# Patient Record
Sex: Female | Born: 1965
Health system: Southern US, Community
[De-identification: ages and names within clinical notes are randomized; demographics above are authoritative.]

## PROBLEM LIST (undated history)

## (undated) DIAGNOSIS — N920 Excessive and frequent menstruation with regular cycle: Secondary | ICD-10-CM

## (undated) DIAGNOSIS — K219 Gastro-esophageal reflux disease without esophagitis: Secondary | ICD-10-CM

## (undated) DIAGNOSIS — F329 Major depressive disorder, single episode, unspecified: Secondary | ICD-10-CM

## (undated) DIAGNOSIS — E559 Vitamin D deficiency, unspecified: Secondary | ICD-10-CM

## (undated) DIAGNOSIS — G4733 Obstructive sleep apnea (adult) (pediatric): Secondary | ICD-10-CM

## (undated) DIAGNOSIS — Z973 Presence of spectacles and contact lenses: Secondary | ICD-10-CM

## (undated) DIAGNOSIS — R0602 Shortness of breath: Secondary | ICD-10-CM

## (undated) DIAGNOSIS — F32A Depression, unspecified: Secondary | ICD-10-CM

## (undated) DIAGNOSIS — R5383 Other fatigue: Secondary | ICD-10-CM

## (undated) DIAGNOSIS — I10 Essential (primary) hypertension: Secondary | ICD-10-CM

## (undated) DIAGNOSIS — F419 Anxiety disorder, unspecified: Secondary | ICD-10-CM

## (undated) DIAGNOSIS — E785 Hyperlipidemia, unspecified: Secondary | ICD-10-CM

## (undated) DIAGNOSIS — E119 Type 2 diabetes mellitus without complications: Secondary | ICD-10-CM

## (undated) DIAGNOSIS — Z9989 Dependence on other enabling machines and devices: Secondary | ICD-10-CM

## (undated) DIAGNOSIS — K5900 Constipation, unspecified: Secondary | ICD-10-CM

## (undated) HISTORY — DX: Obstructive sleep apnea (adult) (pediatric): G47.33

## (undated) HISTORY — DX: Essential (primary) hypertension: I10

## (undated) HISTORY — DX: Hyperlipidemia, unspecified: E78.5

## (undated) HISTORY — DX: Constipation, unspecified: K59.00

## (undated) HISTORY — DX: Vitamin D deficiency, unspecified: E55.9

## (undated) HISTORY — PX: BREAST EXCISIONAL BIOPSY: SUR124

## (undated) HISTORY — DX: Shortness of breath: R06.02

## (undated) HISTORY — DX: Other fatigue: R53.83

---

## 1998-08-15 ENCOUNTER — Other Ambulatory Visit: Admission: RE | Admit: 1998-08-15 | Discharge: 1998-08-15 | Payer: Self-pay | Admitting: Obstetrics and Gynecology

## 1999-12-01 ENCOUNTER — Other Ambulatory Visit: Admission: RE | Admit: 1999-12-01 | Discharge: 1999-12-01 | Payer: Self-pay | Admitting: Obstetrics and Gynecology

## 2001-05-16 ENCOUNTER — Other Ambulatory Visit: Admission: RE | Admit: 2001-05-16 | Discharge: 2001-05-16 | Payer: Self-pay | Admitting: *Deleted

## 2001-12-07 ENCOUNTER — Encounter: Admission: RE | Admit: 2001-12-07 | Discharge: 2002-03-07 | Payer: Self-pay | Admitting: *Deleted

## 2002-02-13 ENCOUNTER — Other Ambulatory Visit: Admission: RE | Admit: 2002-02-13 | Discharge: 2002-02-13 | Payer: Self-pay | Admitting: *Deleted

## 2004-07-10 ENCOUNTER — Inpatient Hospital Stay (HOSPITAL_COMMUNITY): Admission: AD | Admit: 2004-07-10 | Discharge: 2004-07-10 | Payer: Self-pay | Admitting: Obstetrics & Gynecology

## 2006-05-20 ENCOUNTER — Ambulatory Visit: Payer: Self-pay | Admitting: Internal Medicine

## 2006-05-20 LAB — CONVERTED CEMR LAB
ALT: 17 units/L (ref 0–40)
AST: 17 units/L (ref 0–37)
Alkaline Phosphatase: 62 units/L (ref 39–117)
BUN: 14 mg/dL (ref 6–23)
Basophils Relative: 3.6 % — ABNORMAL HIGH (ref 0.0–1.0)
Cholesterol: 152 mg/dL (ref 0–200)
Creatinine, Ser: 0.9 mg/dL (ref 0.4–1.2)
Crystals: NEGATIVE
Eosinophils Absolute: 0.1 10*3/uL (ref 0.0–0.6)
GFR calc Af Amer: 89 mL/min
GFR calc non Af Amer: 74 mL/min
Hemoglobin: 12.1 g/dL (ref 12.0–15.0)
Lymphocytes Relative: 20.9 % (ref 12.0–46.0)
MCHC: 33.1 g/dL (ref 30.0–36.0)
Monocytes Relative: 6 % (ref 3.0–11.0)
Neutro Abs: 5.1 10*3/uL (ref 1.4–7.7)
Neutrophils Relative %: 68.5 % (ref 43.0–77.0)
Nitrite: NEGATIVE
Platelets: 242 10*3/uL (ref 150–400)
RBC: 3.97 M/uL (ref 3.87–5.11)
Sodium: 141 meq/L (ref 135–145)
Total Protein, Urine: NEGATIVE mg/dL
Urine Glucose: NEGATIVE mg/dL
VLDL: 13 mg/dL (ref 0–40)

## 2006-05-26 ENCOUNTER — Ambulatory Visit: Payer: Self-pay | Admitting: Internal Medicine

## 2006-07-02 ENCOUNTER — Ambulatory Visit: Payer: Self-pay | Admitting: Pulmonary Disease

## 2006-09-21 ENCOUNTER — Encounter: Admission: RE | Admit: 2006-09-21 | Discharge: 2006-09-21 | Payer: Self-pay | Admitting: Specialist

## 2006-09-28 ENCOUNTER — Encounter: Admission: RE | Admit: 2006-09-28 | Discharge: 2006-09-28 | Payer: Self-pay | Admitting: Specialist

## 2007-06-22 ENCOUNTER — Ambulatory Visit: Payer: Self-pay | Admitting: Thoracic Surgery

## 2007-09-22 ENCOUNTER — Encounter: Admission: RE | Admit: 2007-09-22 | Discharge: 2007-09-22 | Payer: Self-pay | Admitting: Specialist

## 2007-09-29 ENCOUNTER — Encounter: Admission: RE | Admit: 2007-09-29 | Discharge: 2007-09-29 | Payer: Self-pay | Admitting: Specialist

## 2007-10-20 ENCOUNTER — Ambulatory Visit (HOSPITAL_COMMUNITY): Admission: RE | Admit: 2007-10-20 | Discharge: 2007-10-20 | Payer: Self-pay | Admitting: Surgery

## 2007-11-09 ENCOUNTER — Ambulatory Visit (HOSPITAL_COMMUNITY): Admission: RE | Admit: 2007-11-09 | Discharge: 2007-11-09 | Payer: Self-pay | Admitting: Surgery

## 2007-11-10 ENCOUNTER — Encounter: Admission: RE | Admit: 2007-11-10 | Discharge: 2007-11-10 | Payer: Self-pay | Admitting: Surgery

## 2007-11-14 ENCOUNTER — Encounter (INDEPENDENT_AMBULATORY_CARE_PROVIDER_SITE_OTHER): Payer: Self-pay | Admitting: Surgery

## 2007-11-14 ENCOUNTER — Encounter: Admission: RE | Admit: 2007-11-14 | Discharge: 2007-11-14 | Payer: Self-pay | Admitting: Surgery

## 2007-11-14 ENCOUNTER — Ambulatory Visit (HOSPITAL_BASED_OUTPATIENT_CLINIC_OR_DEPARTMENT_OTHER): Admission: RE | Admit: 2007-11-14 | Discharge: 2007-11-14 | Payer: Self-pay | Admitting: Surgery

## 2007-11-14 HISTORY — PX: OTHER SURGICAL HISTORY: SHX169

## 2008-04-25 ENCOUNTER — Ambulatory Visit: Payer: Self-pay | Admitting: Thoracic Surgery

## 2008-04-30 ENCOUNTER — Ambulatory Visit: Payer: Self-pay | Admitting: Thoracic Surgery

## 2008-04-30 ENCOUNTER — Ambulatory Visit (HOSPITAL_COMMUNITY): Admission: RE | Admit: 2008-04-30 | Discharge: 2008-05-01 | Payer: Self-pay | Admitting: Surgery

## 2008-04-30 ENCOUNTER — Encounter (INDEPENDENT_AMBULATORY_CARE_PROVIDER_SITE_OTHER): Payer: Self-pay | Admitting: Surgery

## 2008-04-30 ENCOUNTER — Encounter: Payer: Self-pay | Admitting: Surgery

## 2008-04-30 HISTORY — PX: THYROID LOBECTOMY: SHX420

## 2008-09-24 ENCOUNTER — Encounter: Admission: RE | Admit: 2008-09-24 | Discharge: 2008-09-24 | Payer: Self-pay | Admitting: Specialist

## 2009-03-12 ENCOUNTER — Encounter: Admission: RE | Admit: 2009-03-12 | Discharge: 2009-03-12 | Payer: Self-pay | Admitting: Specialist

## 2009-04-16 ENCOUNTER — Encounter: Admission: RE | Admit: 2009-04-16 | Discharge: 2009-04-16 | Payer: Self-pay | Admitting: Orthopedic Surgery

## 2009-07-10 ENCOUNTER — Encounter: Admission: RE | Admit: 2009-07-10 | Discharge: 2009-07-10 | Payer: Self-pay | Admitting: Specialist

## 2009-09-26 ENCOUNTER — Encounter: Admission: RE | Admit: 2009-09-26 | Discharge: 2009-09-26 | Payer: Self-pay | Admitting: Specialist

## 2010-03-02 ENCOUNTER — Encounter: Payer: Self-pay | Admitting: Specialist

## 2010-05-22 LAB — PROTIME-INR
INR: 1 (ref 0.00–1.49)
Prothrombin Time: 13.5 seconds (ref 11.6–15.2)

## 2010-05-22 LAB — COMPREHENSIVE METABOLIC PANEL
Albumin: 3.6 g/dL (ref 3.5–5.2)
Alkaline Phosphatase: 58 U/L (ref 39–117)
BUN: 17 mg/dL (ref 6–23)
CO2: 27 mEq/L (ref 19–32)
Creatinine, Ser: 1.13 mg/dL (ref 0.4–1.2)
GFR calc Af Amer: >60 mL/min (ref >60)
Potassium: 3.5 mEq/L (ref 3.5–5.1)
Sodium: 137 mEq/L (ref 135–145)

## 2010-05-22 LAB — DIFFERENTIAL
Basophils Absolute: 0 10*3/uL (ref 0.0–0.1)
Basophils Relative: 1 % (ref 0–1)
Eosinophils Relative: 1 % (ref 0–5)
Lymphs Abs: 1.5 10*3/uL (ref 0.7–4.0)
Monocytes Absolute: 0.6 10*3/uL (ref 0.1–1.0)
Monocytes Relative: 8 % (ref 3–12)

## 2010-05-22 LAB — TYPE AND SCREEN
ABO/RH(D): O POS
Antibody Screen: NEGATIVE

## 2010-05-22 LAB — CBC
Hemoglobin: 12.6 g/dL (ref 12.0–15.0)
MCV: 92.5 fL (ref 78.0–100.0)
RBC: 3.89 MIL/uL (ref 3.87–5.11)

## 2010-05-22 LAB — URINE MICROSCOPIC-ADD ON

## 2010-05-22 LAB — URINALYSIS, ROUTINE W REFLEX MICROSCOPIC

## 2010-05-22 LAB — ABO/RH: ABO/RH(D): O POS

## 2010-05-22 LAB — CALCIUM: Calcium: 9.8 mg/dL (ref 8.4–10.5)

## 2010-06-24 NOTE — Op Note (Signed)
Erin [name], [name] NO.:  [id number]   MEDICAL RECORD NO.:  [id number]          PATIENT TYPE:  OIB   LOCATION:  5151                         FACILITY:  [facility]   PHYSICIAN:  Velora Heckler, MD      DATE OF BIRTH:  [birth year]   DATE OF PROCEDURE:  04/30/2008  DATE OF DISCHARGE:                               OPERATIVE REPORT   PREOPERATIVE DIAGNOSIS:  Thyroid goiter with substernal component.   POSTOPERATIVE DIAGNOSIS:  Thyroid goiter with substernal component.   PROCEDURE:  Right thyroid lobectomy with resection of substernal mass.   SURGEON:  Velora Heckler, MD, FACS   ASSISTANT:  Ines Bloomer, MD, FACS   ANESTHESIA:  General per Dr. Claybon Jabs.   ESTIMATED BLOOD LOSS:  Minimal.   PREPARATION:  Betadine.   COMPLICATIONS:  None.   INDICATIONS:  The patient is a 45 year old black female with a 7-cm  intrathoracic goiter on the right side.  She had been evaluated by Dr.  Edwyna Shell.  She was referred to our practice.  The patient had significant  tracheal deviation.  She now comes to surgery for resection.   BODY OF REPORT:  Procedure was done in OR #7 at the [facility].  The patient was brought to the operating room and  placed in a supine position on the operating room table.  Following  administration of general anesthesia, the patient is positioned and then  prepped and draped in the usual strict aseptic fashion.  After  ascertaining that an adequate level of anesthesia have been achieved, a  cervical incision was made with a #15 blade.  Dissection was carried  through subcutaneous tissues and platysma.  Hemostasis was obtained with  electrocautery.  Subplatysmal flaps were developed from the thyroid  notch to the sternal notch.  A Mahorner self-retaining retractor was  placed for exposure.  Strap muscles were incised in the midline.  Dissection was begun on the right.  Strap muscles were mobilized and  reflected laterally.   Superior pole was dissected out.  Superior pole  vessels were divided between medium Ligaclips with the Harmonic scalpel.  Branches of the inferior thyroid artery were divided between small  Ligaclips with the Harmonic scalpel.  Recurrent laryngeal nerve was  identified and preserved.  Parathyroid tissue was identified and  preserved.  There was a large mass extending into the anterior  mediastinum.  This was gently mobilized.  Venous tributaries were  divided between Ligaclips with the Harmonic scalpel.  Using gentle  retraction, the mass was delivered out of the mediastinum and into the  neck.  The entire thyroid was then rolled medially.  Remaining branches  of the inferior thyroid artery were divided between Ligaclips with the  Harmonic scalpel.  Ligament of Allyson Sabal was transected with the  electrocautery and the gland was mobilized up and onto the anterior  trachea.  There was no significant pyramidal lobe.  Isthmus was  mobilized across the midline.  Inferior venous tributaries were ligated  in continuity with 2-0 silk ties and divided with the Harmonic scalpel.  Isthmus was transected  at the junction with the left thyroid lobe with  the Harmonic scalpel.  A suture was used to mark the right superior  pole.  The entire specimen was submitted to Pathology for review.  Left  thyroid lobe was examined.  Strap muscles were mobilized laterally.  Palpation reveals no masses.  There were no significant nodules.  The  left lobe was of normal sized.  There was no visual abnormality.  Decision was made to leave the left thyroid lobe in situ.  Neck was  irrigated copiously with warm saline which was evacuated.  Good  hemostasis was noted.  Surgicel was placed in the operative field.  Strap muscles were reapproximated in the midline with interrupted 3-0  Vicryl sutures.  Platysma was closed with interrupted 3-0 Vicryl  sutures.  Skin was closed with a running 4-0 Monocryl subcuticular  suture.   Wound was washed and dried and benzoin and Steri-Strips were  applied.  Sterile dressings were applied.  The patient was awakened from  anesthesia and brought to the recovery room in stable condition.  The  patient tolerated the procedure well.      Velora Heckler, MD  Electronically Signed     TMG/MEDQ  D:  04/30/2008  T:  04/30/2008  Job:  [id number]   cc:   Ines Bloomer, M.D.  Dr. Mayford Knife

## 2010-06-24 NOTE — Letter (Signed)
Jun 22, 2007   Lerry Liner, M.D.  [phone]  [place], [place]  [address]   Re:  VONETTA, FOULK                 DOB:  03/05/1965   Dear Dr. Mayford Knife:   I appreciate the opportunity to see Erin [name].  This 45 year old,  African American female was having a cough, and we got a chest x-ray and  then a CT scan which revealed a 6 x 7 cm intrathoracic goiter on the  right side.  She is referred here for evaluation.  She has no symptoms  of hyperthyroidism.  There is no thyroid function tests with her.  She  has no fever.  She has no dysphasia.  Her past medical history is  significant for being on Triplex 325 mg daily, ranitidine 300 mg daily,  Benicar/hydrochlorothiazide 40/25 daily.  She has hypertension.  Her  family history is noncontributory.  She is married and has no children.  She works as an Tax adviser.  She quit smoking December 20, 1998.  Does not drink alcohol on a regular basis.   REVIEW OF SYSTEMS:  She is 5 feet 3.  She is 300 pounds.  GENERAL:  Weight has been stable.  CARDIAC:  No angina or atrial fibrillation.  PULMONARY:  She has had a cough.  No hemoptysis.  GI:  No reflux and no  abdominal pain.  GU:  No kidney disease, dysuria, or frequent urination.  VASCULAR:  No claudication, DVT, TIAs.  NEUROLOGICAL:  No dizziness,  headaches, blackouts, or seizures.  MUSCULOSKELETAL:  No arthritis or  joint pain.  PSYCHIATRIC:  No depression or nervous.  EYE/ENT:  No  change in her eyesight or hearing.  HEMATOLOGICAL:  No problems with  bleeding or clotting disorders.   PHYSICAL EXAMINATION:  GENERAL:  She is an obese, African American  female in no acute distress.  VITAL SIGNS:  Her blood pressure is 160/98, pulse 88, respirations 18,  sats were 90%.  HEAD, EYES, EARS, NOSE, AND THROAT:  Unremarkable.  NECK:  Supple.  I do think she does have some thyromegaly particularly  on the right side of her thyroid gland.  No carotid bruits.  No  supraclavicular or axillary adenopathy.  CHEST:  Clear to auscultation and percussion.  HEART:  Regular sinus rhythm.  No murmurs.  ABDOMEN:  Soft.  No splenomegaly.  EXTREMITIES:  Pulses 2+.  There is no clubbing or edema.   I feel that she has a probable goiter on the right side of her thyroid  gland.  Given the size and the tracheal deviation, this probably needs  to be excised, and I explained this to her and her family.  I am  referring her to Dr. Darnell Level who I work with in excising of these  goiters and also suggested she get some thyroid function tests prior to  consideration for any surgery.  I appreciate the opportunity of seeing  Ms. Quinones.   Sincerely,   Ines Bloomer, M.D.  Electronically Signed   DPB/MEDQ  D:  06/22/2007  T:  06/22/2007  Job:  [id number]   cc:   Velora Heckler, MD  Jone Baseman. Mayford Knife, M.D.

## 2010-06-24 NOTE — Assessment & Plan Note (Signed)
[facility]                             PULMONARY OFFICE NOTE   TINESHIA, BECRAFT                    MRN:          [id number]  DATE:07/02/2006                            DOB:          [birth year]    SLEEP MEDICINE CONSULTATION:   HISTORY OF PRESENT ILLNESS:  The patient is a very pleasant 45 year old  black female who I have been asked to see for management of obstructive  sleep apnea.  The patient was diagnosed approximately 2 years ago with  sleep apnea of unknown severity and was started on CPAP by [facility].  She was placed on a ResMed Elite that she thinks was at 7  cm.  She did have heat and humidity and a full face mask.  She has had  no follow-up, equipment checks, or further evaluations.  The patient  currently has a mask that is greater than 74 year old.  She has been  compliant with the CPAP.  Despite doing this, she feels that she is not  resting as well as she did initially.  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  She will  go to bed between 1 and 2 a.m. and get up at 8:30 a.m. to start her day.  She is not rested upon arising.  She has sleepiness during the day with  reading and quiet times but does not consider alertness to be a major  issue for her.  She has no difficulties with driving.  Of note, her  weight is down 25 pounds since her sleep study.   PAST MEDICAL HISTORY:  1. Hypertension.  2. Sleep apnea as stated above.   CURRENT MEDICATIONS:  1. Methyldopa 25 mg daily.  2. Aspirin 81 mg daily.   The patient has no known drug allergies.   SOCIAL HISTORY:  She is married.  She does not have children.  She has a  history of smoking 1-1/2 packs per day for 12 years.  She has not smoked  since 2000.   FAMILY HISTORY:  Noncontributory in first degree relatives.   REVIEW OF SYSTEMS:  As per history of present illness.  Also see patient  intake form  documented in the chart.   PHYSICAL EXAMINATION:  GENERAL:  She is a morbidly obese white female in  no acute distress.  VITAL SIGNS:  Blood pressure is 142/90, pulse 82, temperature is 98.8,  weight is 276 pounds.  O2 saturation on room air is 96%.  HEENT:  Pupils equal, round and reactive to light and accommodation.  Extraocular muscles are intact.  Nares show turbinate hypertrophy.  Oropharynx does show elongation of the soft palate and uvula.  The  patient has a small posterior pharyngeal space.  NECK:  Large but no thyromegaly or lymphadenopathy.  CHEST:  Totally clear.  CARDIAC:  Regular rate and rhythm, no murmurs, rubs or gallops.  ABDOMEN:  Soft, nontender, with good bowel sounds.  GENITAL, RECTAL, BREASTS:  Exams not done and not indicated.  EXTREMITIES:  Lower extremities are without  edema.  Pulses are intact  distally.  NEUROLOGIC:  Alert and oriented with no evidence of motor deficits.   IMPRESSION:  History of obstructive sleep apnea, which is being treated  with CPAP.  The patient has been compliant; however, she does not feel  as rested as she did and has some alertness issues during the day,  though not severe.  At this point in time I think we need to have her  machine checked to make sure that it is putting out the appropriate  pressure, get her a new mask as well as supplies, and also do an  outpatient auto-titrate study to see if her pressure is adequate for her  at this point in time.  The patient is agreeable to this approach.  She  understands that she needs to work aggressively on weight loss.   PLAN:  1. Work on weight loss.  2. Auto-titrate device for 2 weeks with download.  3. New mask and supplies.  4. The patient will follow up in approximately 6 months or sooner if      there are problems.  I will call her with the results of her auto-      titrate study and see if that makes significant improvement.     Barbaraann Share, MD,FCCP  Electronically  Signed    KMC/MedQ  DD: 07/07/2006  DT: 07/07/2006  Job #: [id number]   cc:   Rosalyn Gess. Norins, MD

## 2010-06-24 NOTE — Assessment & Plan Note (Signed)
OFFICE VISIT   [name], Erin SPECHT  DOB:  08-22-1965                                        April 25, 2008  CHART #:  [id number]   The patient returns for a followup.  She has been scheduled by Dr.  Leretha Pol to have a thyroid lobectomy on the April 30, 2008.  She  understands the risks of the procedure and the probability she may have  to have a mediastinotomy.  Blood pressure is 141/82, pulse 75,  respirations 18, and sats are 98%.  We answered all the questions that  she had.  Unfortunately, she recently lost her husband from complication  of gastric bypass surgery.  I will see a Careers adviser.   Ines Bloomer, M.D.  Electronically Signed   DPB/MEDQ  D:  04/25/2008  T:  04/25/2008  Job:  [id number]

## 2010-06-24 NOTE — Op Note (Signed)
NAMESERINA, NICHTER NO.:  [id number]   MEDICAL RECORD NO.:  [id number]          PATIENT TYPE:  AMB   LOCATION:  DSC                          FACILITY:  [facility]   PHYSICIAN:  Velora Heckler, MD      DATE OF BIRTH:  01/31/1966   DATE OF PROCEDURE:  11/14/2007  DATE OF DISCHARGE:                               OPERATIVE REPORT   PREOPERATIVE DIAGNOSIS:  Abnormal mammogram, left breast.   POSTOPERATIVE DIAGNOSIS:  Abnormal mammogram, left breast.   PROCEDURE:  Left breast excisional biopsy with wire localization.   SURGEON:  Velora Heckler, MD, FACS   ANESTHESIA:  General per Dr. Autumn Patty   ESTIMATED BLOOD LOSS:  Minimal.   PREPARATION:  Betadine.   COMPLICATIONS:  None.   INDICATIONS:  The patient is a 45 year old black female with a routine  screening mammogram in August 2009.  This showed an abnormality.  Ultrasound was performed, showed dilated milk ducts and a 9-mm  intraductal mass.  Excision was recommended.  The patient underwent wire  localization prior to the procedure.  She now comes to the operating  room for excision.   BODY OF REPORT:  Procedure is done in the OR #2 at the [facility].  The patient was brought to the operating room and  placed in supine position on the operating room table.  Following  administration of general anesthesia, the patient was prepped and draped  in the usual strict aseptic fashion.  After ascertaining that an  adequate level of anesthesia had been achieved, the skin at the site of  guidewire insertion was anesthetized with local anesthetic.  A  curvilinear incision was made with a #15 blade.  Dissection was carried  into subcutaneous tissues, and hemostasis obtained with electrocautery.  A core of breast tissue was excised around the guidewire.  The anterior  margin was in the subcutaneous tissues.  Dissection was carried past the  tip of the guidewire, then a block of tissue was excised.   Specimen was  placed in the Faxitron, and a specimen mammogram was obtained.  This  shows that the lesion and the clip and the guidewire totally contained  within the specimen.  This specimen was then submitted to Pathology for  review.   Good hemostasis was achieved in the wound with the electrocautery.  Skin  edges were reapproximated with a running 4-0 Vicryl subcuticular suture.  Wound was washed and dried, and benzoin and Steri-Strips were applied.  Sterile dressings were applied.  The patient was awakened from  anesthesia and brought to the recovery room in stable condition.  The  patient tolerated the procedure well.      Velora Heckler, MD  Electronically Signed     TMG/MEDQ  D:  11/14/2007  T:  11/15/2007  Job:  [id number]   cc:   Lerry Liner, MD  [facility]

## 2010-06-27 NOTE — Assessment & Plan Note (Signed)
[facility]                           PRIMARY CARE OFFICE NOTE   TSION, INGHRAM                    MRN:          [id number]  DATE:05/26/2006                            DOB:          04-15-1965    Ms. Prescott is a pleasant 45 year old woman who presents to establish  for ongoing continuity care.   CHIEF COMPLAINT:  1. Nagging cough which was worse on Lotrel, but even off Lotrel      continues to be a problem.  She reports it is nonproductive and      does feel like an irritant type cough.  2. Lump on her sternum which has been present for many years.   PAST MEDICAL HISTORY:  SURGICAL:  None.   TRAUMA:  None.   MEDICAL:  1. Patient had no childhood diseases and is fully immunized.  2. Hypertension.  3. Hyperlipidemia.  4. Sleep apnea, on a CPAP with initial study at [facility].  5. Urinary tract infections.   GYN HISTORY:  Menarche at age 63, she has an irregular menses, she is a  gravida 0 para 0 but is trying to get pregnant.   CURRENT MEDICATIONS:  1. Benicar HCT 40/25 once daily.  2. Folic acid 400 mcg daily.  3. B-12 250 mcg daily.  4. Low dose aspirin.  5. Flax seed oil.   FAMILY HISTORY:  Mother died at age 44 with complications of a CVA.  Father is 69, he has glaucoma but otherwise in good health.  Mother did  have hypertension and diabetes.  No family history for colon cancer,  ovarian cancer, lung cancer, breast cancer.   SOCIAL HISTORY:  .  The patient is a high school graduate.  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  She has been married for 18 years and her marriage is in  good health.   REVIEW OF SYSTEMS:  Negative for any constitutional problems.  The  patient has had an eye exam in March of 2008.  No ENT, CARDIOVASCULAR  problems.  Respiratory as above with obstructive sleep apnea.  No GI,  GU, or musculoskeletal problems.   EXAMINATION:  Temperature was 97.4, blood pressure  147/93, pulse 75,  weight 278.  GENERAL APPEARANCE:  This is an overweight African American woman in no  acute distress.  HEENT:  Normocephalic/atraumatic, EACs and TMs were unremarkable,  oropharynx with native dentition in good repair, no buccal or palatal  lesions were noted, posterior pharynx was clear, conjunctivae and  sclerae was clear, PERRLA/EOMI, funduscopic exam deferred to  Ophthalmology.  NECK:  Supple without thyromegaly.  NODES:  No adenopathy was noted in the cervical, supraclavicular  regions.  CHEST:  No CVA tenderness.  LUNGS:  Clear to auscultation and percussion.  BREAST:  Exam is deferred to Dr. Cherly Hensen.  CARDIOVASCULAR:  With 2+ peripheral pulse, no JVD or carotid bruits.  She had a quiet precordium with a regular rate and rhythm without  murmurs, rubs, or gallops.  ABDOMEN:  Obese, soft, no guarding or rebound, no organosplenomegaly was  appreciated.  PELVIC AND RECTAL:  Exams deferred to Dr. Cherly Hensen.  EXTREMITIES:  Without clubbing, cyanosis, edema, or deformity.  NEUROLOGIC:  Exam was nonfocal.   DATABASE:  A 12 lead electrocardiogram revealed a normal sinus rhythm,  was a normal EKG.   LABORATORY:  Hemoglobin 12.1 grams, white count was 7600 with a normal  differential.  Chemistries were unremarkable.  Serum glucose of 110.  Kidney function normal.  The creatinine is 0.9.  Liver functions were  normal.  Cholesterol 152, triglycerides 63, HDL 37.4, LDL 102.  Thyroid  was normal with a TSH of 1.00.  Urinalysis was negative except for 10-20  WBCs per high powered field but only 1+ bacteria with the patient being  asymptomatic.   ASSESSMENT:  1. Hypertension.  The patient is borderline controlled.  At this time      we will switch her from her Benicar to methyldopa 250 mg b.i.d.      with the anticipation of pregnancy.  She should have a followup      blood pressure check at her convenience.  2. Cough.  The patient's examination is unremarkable with  clear lungs.      Suspect she may have mild asymptomatic reflux causing her cough.   PLAN:  1. The patient is given Aciphex 20 mg #15 to take q.a.m. to see if      this improves her cough.  2. Weight management.  Discussed this with the patient.  Have      recommended a program of calorie restriction with smart food      choices.  Weight goal will be 180 pounds with annual goal of 18      pounds per year.  I have encouraged her to consider Weight      Watchers.  3. Pulmonary.  Patient with obstructive sleep apnea.  She last was      studied about 2 years ago.  I have recommended that she have an      appointment with Dr. Marcelyn Bruins for followup of her sleep apnea      and question of a need for a titration study.  4. Health maintenance.  The patient is current with her gynecologist.      Her laboratory is unremarkable as noted.   Patient is asked to return to see me in 4-6 weeks for a consolidation  visit.     Rosalyn Gess Norins, MD  Electronically Signed    MEN/MedQ  DD: 05/27/2006  DT: 05/27/2006  Job #: [id number]   cc:   Luellen Pucker, M.D.

## 2010-09-10 ENCOUNTER — Other Ambulatory Visit: Payer: Self-pay | Admitting: Specialist

## 2010-09-10 DIAGNOSIS — Z1231 Encounter for screening mammogram for malignant neoplasm of breast: Secondary | ICD-10-CM

## 2010-10-01 ENCOUNTER — Ambulatory Visit
Admission: RE | Admit: 2010-10-01 | Discharge: 2010-10-01 | Disposition: A | Discharge status: Home / Self Care | Payer: Commercial Managed Care - PPO | Source: Ambulatory Visit | Attending: Specialist | Admitting: Specialist

## 2010-10-01 DIAGNOSIS — Z1231 Encounter for screening mammogram for malignant neoplasm of breast: Secondary | ICD-10-CM

## 2010-11-11 LAB — BASIC METABOLIC PANEL
BUN: 16
Creatinine, Ser: 0.96
GFR calc non Af Amer: >60
Glucose, Bld: 99
Potassium: 3.7

## 2011-09-16 ENCOUNTER — Other Ambulatory Visit: Payer: Self-pay | Admitting: Specialist

## 2011-09-16 DIAGNOSIS — Z1231 Encounter for screening mammogram for malignant neoplasm of breast: Secondary | ICD-10-CM

## 2011-10-07 ENCOUNTER — Ambulatory Visit
Admission: RE | Admit: 2011-10-07 | Discharge: 2011-10-07 | Disposition: A | Discharge status: Home / Self Care | Payer: 59 | Source: Ambulatory Visit | Attending: Specialist | Admitting: Specialist

## 2011-10-07 DIAGNOSIS — Z1231 Encounter for screening mammogram for malignant neoplasm of breast: Secondary | ICD-10-CM

## 2012-09-02 ENCOUNTER — Other Ambulatory Visit: Payer: Self-pay

## 2012-09-02 DIAGNOSIS — Z1231 Encounter for screening mammogram for malignant neoplasm of breast: Secondary | ICD-10-CM

## 2012-10-07 ENCOUNTER — Ambulatory Visit
Admission: RE | Admit: 2012-10-07 | Discharge: 2012-10-07 | Disposition: A | Discharge status: Home / Self Care | Payer: 59 | Source: Ambulatory Visit

## 2012-10-07 DIAGNOSIS — Z1231 Encounter for screening mammogram for malignant neoplasm of breast: Secondary | ICD-10-CM

## 2013-09-06 ENCOUNTER — Other Ambulatory Visit: Payer: Self-pay

## 2013-09-06 DIAGNOSIS — Z1231 Encounter for screening mammogram for malignant neoplasm of breast: Secondary | ICD-10-CM

## 2013-10-09 ENCOUNTER — Ambulatory Visit
Admission: RE | Admit: 2013-10-09 | Discharge: 2013-10-09 | Disposition: A | Discharge status: Home / Self Care | Payer: 59 | Source: Ambulatory Visit

## 2013-10-09 ENCOUNTER — Encounter (INDEPENDENT_AMBULATORY_CARE_PROVIDER_SITE_OTHER): Payer: Self-pay

## 2013-10-09 DIAGNOSIS — Z1231 Encounter for screening mammogram for malignant neoplasm of breast: Secondary | ICD-10-CM

## 2013-11-18 ENCOUNTER — Ambulatory Visit: Payer: 59

## 2013-12-16 ENCOUNTER — Ambulatory Visit: Payer: 59

## 2014-01-13 ENCOUNTER — Encounter: Payer: 59 | Attending: Specialist

## 2014-07-12 NOTE — Patient Outreach (Signed)
[facility]) Care Management  07/12/2014  MELLONIE GUESS 1965-07-13 [id number]  Member has not scheduled [facility] appointment after being sent appointment request letter on 06/22/14.  Member terminated from Link to [employer] as she has not followed program requirements.  She was last seen by [facility] on 10/18/13 and she did not attend required educational classes. Case closed as member has withdrawn from program.  Termination letter sent. Peter Garter RN, [facility] Care Management Coordinator-[facility] to [place] [facility] [phone]

## 2015-02-21 MED FILL — raNITIdine HCL 300 MG TABS: 300 | 90 days supply | Qty: 90 | Fill #1

## 2015-02-21 MED FILL — CITALOPRAM HBR 20 MG TABLET: 20 | 30 days supply | Qty: 30 | Fill #2

## 2015-02-21 MED FILL — metFORMIN HCL 500 MG TABS: 500 | 30 days supply | Qty: 60 | Fill #6

## 2015-03-06 MED FILL — OLMESARTAN-HCTZ 40-25 MG TA: 40-25 | 90 days supply | Qty: 90 | Fill #0

## 2015-04-11 MED FILL — CITALOPRAM HBR 20 MG TABLET: 20 | 30 days supply | Qty: 30 | Fill #3

## 2015-04-18 MED FILL — CHERATUSSIN AC SYRUP: 100-10 | 6 days supply | Qty: 120 | Fill #0

## 2015-04-18 MED FILL — FLUCONAZOLE 150 MG TABLET: 150 | 3 days supply | Qty: 2 | Fill #0

## 2015-04-23 MED FILL — FLUCONAZOLE 150 MG TABLET: 150 | 3 days supply | Qty: 2 | Fill #0

## 2015-04-23 MED FILL — FLUTICASONE PROP 50 MCG SPR: 50 | 60 days supply | Qty: 16 | Fill #0

## 2015-05-31 MED FILL — CITALOPRAM HBR 20 MG TABLET: 20 | 30 days supply | Qty: 30 | Fill #4

## 2015-07-05 MED FILL — CITALOPRAM HBR 20 MG TABLET: 20 | 30 days supply | Qty: 30 | Fill #5

## 2015-07-10 DIAGNOSIS — N939 Abnormal uterine and vaginal bleeding, unspecified: Secondary | ICD-10-CM | POA: Diagnosis not present

## 2015-07-17 DIAGNOSIS — N921 Excessive and frequent menstruation with irregular cycle: Secondary | ICD-10-CM | POA: Diagnosis not present

## 2015-07-17 DIAGNOSIS — N924 Excessive bleeding in the premenopausal period: Secondary | ICD-10-CM | POA: Diagnosis not present

## 2015-07-17 MED FILL — OLMESARTAN-HCTZ 40-25 MG TA: 40-25 | 90 days supply | Qty: 90 | Fill #1

## 2015-07-17 MED FILL — raNITIdine HCL 300 MG TABS: 300 | 90 days supply | Qty: 90 | Fill #2

## 2015-07-18 MED FILL — metFORMIN HCL 500 MG TABS: 500 | 30 days supply | Qty: 60 | Fill #0

## 2015-07-23 DIAGNOSIS — R112 Nausea with vomiting, unspecified: Secondary | ICD-10-CM | POA: Diagnosis not present

## 2015-07-23 MED FILL — OMEPRAZOLE DR 40 MG CAPSULE: 40 | 30 days supply | Qty: 60 | Fill #0

## 2015-08-08 NOTE — H&P (Signed)
NAME:  Erin [name], Erin [name] NO.:  [id number]  MEDICAL RECORD NO.:  [id number]  LOCATION:                                 FACILITY:  PHYSICIAN:  Darlyn Chamber, M.D.        DATE OF BIRTH:  DATE OF ADMISSION: DATE OF DISCHARGE:                             HISTORY & PHYSICAL   DATE OF SURGERY:  August 26, 2015, at [facility] outpatient on [facility].  HISTORY OF PRESENT ILLNESS:  The patient is a 50 year old nulligravida female, who presents now for hysteroscopy with NovaSure ablation as well as laparoscopic bilateral tubal ligation.  The patient has had trouble with increasing menstrual flow.  She has 7 days of flow, 5 days being heavy, changing pads every hour to hour and half.  Previous FSH was normal.  She had a saline infusion ultrasound that was unremarkable. Options discussed including Mirena IUD versus birth control pills versus ablation versus hysterectomy.  The patient is in favor of the latter and will proceed with laparoscopic tubal at the same time.  ALLERGIES:  In terms of allergies, she has no known drug allergies listed.  MEDICATIONS:  She is on Benicar with hydrochlorothiazide, Celexa, metformin, and ranitidine.  PAST MEDICAL HISTORY:  She has a history of hypertension as well as diabetes.  She has had a previous thyroidectomy and previous breast surgery.  SOCIAL HISTORY:  Reveals no tobacco or alcohol use.  FAMILY HISTORY:  Noncontributory.  REVIEW OF SYSTEMS:  Noncontributory.  PHYSICAL EXAMINATION:  VITAL SIGNS:  The patient is afebrile with stable vital signs. HEENT:  The patient is normocephalic.  Pupils are equal, round, and reactive to light and accommodation.  Extraocular movements were intact. Sclerae and conjunctivae are clear.  Oropharynx clear. NECK:  Without thyromegaly. BREASTS:  No discrete masses. LUNGS:  Clear. CARDIOVASCULAR:  Regular rate.  No murmurs or gallops.  No carotid or abdominal bruits. ABDOMEN:   Benign. PELVIC:  Normal external genitalia.  Vaginal mucosa is clear.  Cervix, unremarkable.  Uterus, normal size, shape, and contour.  Adnexa, free of masses or tenderness.  IMPRESSION: 1. Menorrhagia, probably secondary to adenomyosis. 2. Desires sterility.  PLAN:  The patient will undergo laparoscopic bilateral tubal ligation along with hysteroscopy with NovaSure ablation.  The risks of surgery have been discussed including the risk of infection.  The risk of hemorrhage that could require transfusion with the risk of AIDS or hepatitis, excessive bleeding could require hysterectomy.  There is a risk of uterine perforation with injury to adjacent organs such as bladder and bowel that could require further exploratory surgery.  Risk of deep venous thrombosis and pulmonary embolus.  With tubal ligation, the potential irreversibility of sterilization was explained.  Failure rate of 1 in 200 is quoted.  Failures can be in the form of ectopic pregnancy, requiring further surgical management.  The patient expressed the understanding of indications and other options.     Darlyn Chamber, M.D.     JSM/MEDQ  D:  08/08/2015  T:  08/08/2015  Job:  [id number]

## 2015-08-08 NOTE — H&P (Signed)
  Patient name Erin [name], Erin [name] DICTATION# [id number] CSN# [id number]  Darlyn Chamber, MD 08/08/2015 11:34 AM

## 2015-08-12 MED FILL — CITALOPRAM HBR 20 MG TABLET: 20 | 30 days supply | Qty: 30 | Fill #6

## 2015-08-16 ENCOUNTER — Encounter (HOSPITAL_BASED_OUTPATIENT_CLINIC_OR_DEPARTMENT_OTHER): Payer: Self-pay | Admitting: *Deleted

## 2015-08-16 DIAGNOSIS — I10 Essential (primary) hypertension: Secondary | ICD-10-CM | POA: Diagnosis not present

## 2015-08-16 DIAGNOSIS — N920 Excessive and frequent menstruation with regular cycle: Secondary | ICD-10-CM | POA: Diagnosis not present

## 2015-08-16 DIAGNOSIS — N800 Endometriosis of uterus: Secondary | ICD-10-CM | POA: Diagnosis not present

## 2015-08-16 DIAGNOSIS — Z302 Encounter for sterilization: Secondary | ICD-10-CM | POA: Diagnosis not present

## 2015-08-16 DIAGNOSIS — N83292 Other ovarian cyst, left side: Secondary | ICD-10-CM | POA: Diagnosis not present

## 2015-08-16 DIAGNOSIS — K219 Gastro-esophageal reflux disease without esophagitis: Secondary | ICD-10-CM | POA: Diagnosis not present

## 2015-08-16 DIAGNOSIS — Z6841 Body Mass Index (BMI) 40.0 and over, adult: Secondary | ICD-10-CM | POA: Diagnosis not present

## 2015-08-16 DIAGNOSIS — G4730 Sleep apnea, unspecified: Secondary | ICD-10-CM | POA: Diagnosis not present

## 2015-08-16 DIAGNOSIS — E119 Type 2 diabetes mellitus without complications: Secondary | ICD-10-CM | POA: Diagnosis not present

## 2015-08-16 DIAGNOSIS — Z79899 Other long term (current) drug therapy: Secondary | ICD-10-CM | POA: Diagnosis not present

## 2015-08-16 DIAGNOSIS — N736 Female pelvic peritoneal adhesions (postinfective): Secondary | ICD-10-CM | POA: Diagnosis not present

## 2015-08-16 DIAGNOSIS — Z7984 Long term (current) use of oral hypoglycemic drugs: Secondary | ICD-10-CM | POA: Diagnosis not present

## 2015-08-16 DIAGNOSIS — Z9989 Dependence on other enabling machines and devices: Secondary | ICD-10-CM | POA: Diagnosis not present

## 2015-08-16 LAB — CBC
HEMATOCRIT: 39.4 % (ref 36.0–46.0)
Hemoglobin: 13.2 g/dL (ref 12.0–15.0)
MCH: 31.1 pg (ref 26.0–34.0)
MCHC: 33.5 g/dL (ref 30.0–36.0)
MCV: 92.7 fL (ref 78.0–100.0)
PLATELETS: 267 10*3/uL (ref 150–400)
RBC: 4.25 MIL/uL (ref 3.87–5.11)
RDW: 14.4 % (ref 11.5–15.5)
WBC: 9.5 10*3/uL (ref 4.0–10.5)

## 2015-08-16 LAB — BASIC METABOLIC PANEL
Anion gap: 7 (ref 5–15)
BUN: 16 mg/dL (ref 6–20)
CHLORIDE: 105 mmol/L (ref 101–111)
CO2: 24 mmol/L (ref 22–32)
CREATININE: 0.97 mg/dL (ref 0.44–1.00)
Calcium: 9.8 mg/dL (ref 8.9–10.3)
GFR calc Af Amer: >60 mL/min (ref >60)
GFR calc non Af Amer: >60 mL/min (ref >60)
GLUCOSE: 84 mg/dL (ref 65–99)
Potassium: 3.6 mmol/L (ref 3.5–5.1)
Sodium: 136 mmol/L (ref 135–145)

## 2015-08-16 LAB — HCG, SERUM, QUALITATIVE: Preg, Serum: NEGATIVE

## 2015-08-16 NOTE — Progress Notes (Signed)
CBC, BMET, and Serum preg drawn at [facility] for procedure 08/26/15 with Dr.McComb.

## 2015-08-16 NOTE — Progress Notes (Signed)
NPO AFTER MN.  ARRIVE AT 0600.  NEEDS EKG.  GETTING LAB WORK AT PT'S WORK,  SPOKE W/ SYLVIA AT [place] (CBC, BMET, hCG SERUM), .  WILL TAKE CELEXA AND PRILOSEC AM DOS W/ SIPS OF WATER.

## 2015-08-21 DIAGNOSIS — N920 Excessive and frequent menstruation with regular cycle: Secondary | ICD-10-CM | POA: Diagnosis not present

## 2015-08-25 ENCOUNTER — Encounter (HOSPITAL_BASED_OUTPATIENT_CLINIC_OR_DEPARTMENT_OTHER): Payer: Self-pay | Admitting: Anesthesiology

## 2015-08-25 NOTE — Anesthesia Preprocedure Evaluation (Addendum)
Anesthesia Evaluation  Patient identified by MRN, date of birth, ID band Patient awake    Reviewed: Allergy & Precautions, NPO status , Patient's Chart, lab work & pertinent test results  Airway Mallampati: III  TM Distance: >3 FB Neck ROM: Full    Dental no notable dental hx. (+) Teeth Intact, Dental Advisory Given,    Pulmonary sleep apnea and Continuous Positive Airway Pressure Ventilation , former smoker,    Pulmonary exam normal breath sounds clear to auscultation       Cardiovascular hypertension, Pt. on medications Normal cardiovascular exam Rhythm:Regular Rate:Normal     Neuro/Psych negative neurological ROS  negative psych ROS   GI/Hepatic Neg liver ROS, GERD  Medicated and Controlled,  Endo/Other  diabetes, Well Controlled, Type 2, Oral Hypoglycemic AgentsMorbid obesity  Renal/GU negative Renal ROS  negative genitourinary   Musculoskeletal negative musculoskeletal ROS (+)   Abdominal (+) + obese,   Peds  Hematology negative hematology ROS (+)   Anesthesia Other Findings   Reproductive/Obstetrics Menorrhagia Undesired fertility                           Anesthesia Physical Anesthesia Plan  ASA: III  Anesthesia Plan: General   Post-op Pain Management:    Induction: Intravenous and Cricoid pressure planned  Airway Management Planned: Oral ETT  Additional Equipment:   Intra-op Plan:   Post-operative Plan: Extubation in OR  Informed Consent: I have reviewed the patients History and Physical, chart, labs and discussed the procedure including the risks, benefits and alternatives for the proposed anesthesia with the patient or authorized representative who has indicated his/her understanding and acceptance.   Dental advisory given  Plan Discussed with: CRNA, Anesthesiologist and Surgeon  Anesthesia Plan Comments:         Anesthesia Quick Evaluation

## 2015-08-26 ENCOUNTER — Ambulatory Visit (HOSPITAL_BASED_OUTPATIENT_CLINIC_OR_DEPARTMENT_OTHER)
Admission: RE | Admit: 2015-08-26 | Discharge: 2015-08-26 | Disposition: A | Discharge status: Home / Self Care | Payer: 59 | Source: Ambulatory Visit | Attending: Obstetrics and Gynecology | Admitting: Obstetrics and Gynecology

## 2015-08-26 ENCOUNTER — Ambulatory Visit (HOSPITAL_BASED_OUTPATIENT_CLINIC_OR_DEPARTMENT_OTHER): Payer: 59 | Admitting: Anesthesiology

## 2015-08-26 ENCOUNTER — Encounter (HOSPITAL_BASED_OUTPATIENT_CLINIC_OR_DEPARTMENT_OTHER)
Admission: RE | Disposition: A | Discharge status: Home / Self Care | Payer: Self-pay | Source: Ambulatory Visit | Attending: Obstetrics and Gynecology

## 2015-08-26 ENCOUNTER — Encounter (HOSPITAL_BASED_OUTPATIENT_CLINIC_OR_DEPARTMENT_OTHER): Payer: Self-pay | Admitting: *Deleted

## 2015-08-26 DIAGNOSIS — N83292 Other ovarian cyst, left side: Secondary | ICD-10-CM | POA: Diagnosis not present

## 2015-08-26 DIAGNOSIS — Z7984 Long term (current) use of oral hypoglycemic drugs: Secondary | ICD-10-CM | POA: Diagnosis not present

## 2015-08-26 DIAGNOSIS — N736 Female pelvic peritoneal adhesions (postinfective): Secondary | ICD-10-CM | POA: Insufficient documentation

## 2015-08-26 DIAGNOSIS — Z9989 Dependence on other enabling machines and devices: Secondary | ICD-10-CM | POA: Insufficient documentation

## 2015-08-26 DIAGNOSIS — Z6841 Body Mass Index (BMI) 40.0 and over, adult: Secondary | ICD-10-CM | POA: Insufficient documentation

## 2015-08-26 DIAGNOSIS — N920 Excessive and frequent menstruation with regular cycle: Secondary | ICD-10-CM | POA: Diagnosis not present

## 2015-08-26 DIAGNOSIS — Z302 Encounter for sterilization: Secondary | ICD-10-CM | POA: Insufficient documentation

## 2015-08-26 DIAGNOSIS — E119 Type 2 diabetes mellitus without complications: Secondary | ICD-10-CM | POA: Insufficient documentation

## 2015-08-26 DIAGNOSIS — N800 Endometriosis of uterus: Secondary | ICD-10-CM | POA: Diagnosis not present

## 2015-08-26 DIAGNOSIS — Z79899 Other long term (current) drug therapy: Secondary | ICD-10-CM | POA: Diagnosis not present

## 2015-08-26 DIAGNOSIS — I10 Essential (primary) hypertension: Secondary | ICD-10-CM | POA: Diagnosis not present

## 2015-08-26 DIAGNOSIS — G4730 Sleep apnea, unspecified: Secondary | ICD-10-CM | POA: Insufficient documentation

## 2015-08-26 DIAGNOSIS — K219 Gastro-esophageal reflux disease without esophagitis: Secondary | ICD-10-CM | POA: Insufficient documentation

## 2015-08-26 HISTORY — DX: Presence of spectacles and contact lenses: Z97.3

## 2015-08-26 HISTORY — DX: Gastro-esophageal reflux disease without esophagitis: K21.9

## 2015-08-26 HISTORY — DX: Excessive and frequent menstruation with regular cycle: N92.0

## 2015-08-26 HISTORY — DX: Major depressive disorder, single episode, unspecified: F32.9

## 2015-08-26 HISTORY — DX: Obstructive sleep apnea (adult) (pediatric): Z99.89

## 2015-08-26 HISTORY — DX: Type 2 diabetes mellitus without complications: E11.9

## 2015-08-26 HISTORY — PX: LAPAROSCOPIC TUBAL LIGATION: SHX1937

## 2015-08-26 HISTORY — DX: Anxiety disorder, unspecified: F41.9

## 2015-08-26 HISTORY — PX: DILITATION & CURRETTAGE/HYSTROSCOPY WITH NOVASURE ABLATION: SHX5568

## 2015-08-26 HISTORY — DX: Depression, unspecified: F32.A

## 2015-08-26 HISTORY — DX: Obstructive sleep apnea (adult) (pediatric): G47.33

## 2015-08-26 LAB — GLUCOSE, CAPILLARY: GLUCOSE-CAPILLARY: 117 mg/dL — AB (ref 65–99)

## 2015-08-26 LAB — POCT I-STAT 4, (NA,K, GLUC, HGB,HCT)
Glucose, Bld: 86 mg/dL (ref 65–99)
HCT: 38 % (ref 36.0–46.0)
HEMOGLOBIN: 12.9 g/dL (ref 12.0–15.0)
POTASSIUM: 3.2 mmol/L — AB (ref 3.5–5.1)
Sodium: 142 mmol/L (ref 135–145)

## 2015-08-26 LAB — ABO/RH: ABO/RH(D): O POS

## 2015-08-26 LAB — TYPE AND SCREEN
ABO/RH(D): O POS
Antibody Screen: NEGATIVE

## 2015-08-26 SURGERY — DILATATION & CURETTAGE/HYSTEROSCOPY WITH NOVASURE ABLATION
Anesthesia: General | Site: Vagina

## 2015-08-26 MED ORDER — METOCLOPRAMIDE HCL 5 MG/ML IJ SOLN
10.0000 mg | Freq: Once | INTRAMUSCULAR | Status: DC | PRN
  Filled 2015-08-26: qty 2

## 2015-08-26 MED ORDER — CEFAZOLIN SODIUM-DEXTROSE 2-4 GM/100ML-% IV SOLN
INTRAVENOUS | Status: AC
  Filled 2015-08-26: qty 100

## 2015-08-26 MED ORDER — FENTANYL CITRATE (PF) 250 MCG/5ML IJ SOLN
INTRAMUSCULAR | Status: AC
  Filled 2015-08-26: qty 5

## 2015-08-26 MED ORDER — CEFAZOLIN SODIUM-DEXTROSE 2-4 GM/100ML-% IV SOLN
2.0000 g | INTRAVENOUS | Status: AC
  Administered 2015-08-26: 2 g via INTRAVENOUS
  Filled 2015-08-26: qty 100

## 2015-08-26 MED ORDER — OXYCODONE-ACETAMINOPHEN 7.5-325 MG PO TABS
1.0000 | ORAL_TABLET | ORAL | Status: DC | PRN
Start: 2015-08-26 — End: 2016-03-16

## 2015-08-26 MED ORDER — LABETALOL HCL 5 MG/ML IV SOLN
INTRAVENOUS | Status: AC
  Filled 2015-08-26: qty 8

## 2015-08-26 MED ORDER — EPHEDRINE SULFATE 50 MG/ML IJ SOLN
INTRAMUSCULAR | Status: DC | PRN
  Administered 2015-08-26 (×3): 10 mg via INTRAVENOUS

## 2015-08-26 MED ORDER — HYDROCODONE-ACETAMINOPHEN 7.5-325 MG PO TABS
1.0000 | ORAL_TABLET | Freq: Once | ORAL | Status: AC | PRN
  Administered 2015-08-26: 1 via ORAL
  Filled 2015-08-26: qty 1

## 2015-08-26 MED ORDER — HYDROMORPHONE HCL 1 MG/ML IJ SOLN
0.2500 mg | INTRAMUSCULAR | Status: DC | PRN
  Administered 2015-08-26 (×2): 0.5 mg via INTRAVENOUS
  Filled 2015-08-26: qty 1

## 2015-08-26 MED ORDER — KETOROLAC TROMETHAMINE 30 MG/ML IJ SOLN
INTRAMUSCULAR | Status: AC
  Filled 2015-08-26: qty 5

## 2015-08-26 MED ORDER — LIDOCAINE HCL (PF) 1 % IJ SOLN
INTRAMUSCULAR | Status: DC | PRN
  Administered 2015-08-26: 20 mL

## 2015-08-26 MED ORDER — LIDOCAINE HCL (CARDIAC) 20 MG/ML IV SOLN
INTRAVENOUS | Status: AC
  Filled 2015-08-26: qty 5

## 2015-08-26 MED ORDER — PROPOFOL 500 MG/50ML IV EMUL
INTRAVENOUS | Status: AC
  Filled 2015-08-26: qty 50

## 2015-08-26 MED ORDER — PHENYLEPHRINE HCL 10 MG/ML IJ SOLN
INTRAMUSCULAR | Status: AC
  Filled 2015-08-26: qty 4

## 2015-08-26 MED ORDER — SCOPOLAMINE 1 MG/3DAYS TD PT72
MEDICATED_PATCH | TRANSDERMAL | Status: AC
  Filled 2015-08-26: qty 1

## 2015-08-26 MED ORDER — STERILE WATER FOR INJECTION IJ SOLN
INTRAMUSCULAR | Status: AC
  Filled 2015-08-26: qty 10

## 2015-08-26 MED ORDER — HYDROCODONE-ACETAMINOPHEN 7.5-325 MG PO TABS
ORAL_TABLET | ORAL | Status: AC
  Filled 2015-08-26: qty 1

## 2015-08-26 MED ORDER — DEXAMETHASONE SODIUM PHOSPHATE 10 MG/ML IJ SOLN
INTRAMUSCULAR | Status: AC
  Filled 2015-08-26: qty 1

## 2015-08-26 MED ORDER — SODIUM CHLORIDE 0.9 % IR SOLN
Status: DC | PRN
  Administered 2015-08-26: 3000 mL

## 2015-08-26 MED ORDER — PHENYLEPHRINE 40 MCG/ML (10ML) SYRINGE FOR IV PUSH (FOR BLOOD PRESSURE SUPPORT)
PREFILLED_SYRINGE | INTRAVENOUS | Status: AC
  Filled 2015-08-26: qty 10

## 2015-08-26 MED ORDER — BUPIVACAINE HCL 0.25 % IJ SOLN
INTRAMUSCULAR | Status: DC | PRN
  Administered 2015-08-26: 5 mL

## 2015-08-26 MED ORDER — EPHEDRINE SULFATE 50 MG/ML IJ SOLN
INTRAMUSCULAR | Status: AC
  Filled 2015-08-26: qty 1

## 2015-08-26 MED ORDER — SUGAMMADEX SODIUM 200 MG/2ML IV SOLN
INTRAVENOUS | Status: AC
  Filled 2015-08-26: qty 2

## 2015-08-26 MED ORDER — SCOPOLAMINE 1 MG/3DAYS TD PT72
1.0000 | MEDICATED_PATCH | TRANSDERMAL | Status: DC
  Administered 2015-08-26: 1.5 mg via TRANSDERMAL
  Filled 2015-08-26: qty 1

## 2015-08-26 MED ORDER — HYDROMORPHONE HCL 1 MG/ML IJ SOLN
INTRAMUSCULAR | Status: AC
  Filled 2015-08-26: qty 1

## 2015-08-26 MED ORDER — ONDANSETRON HCL 4 MG/2ML IJ SOLN
INTRAMUSCULAR | Status: DC | PRN
  Administered 2015-08-26: 4 mg via INTRAVENOUS

## 2015-08-26 MED ORDER — LACTATED RINGERS IV SOLN
INTRAVENOUS | Status: DC
  Administered 2015-08-26 (×2): via INTRAVENOUS
  Filled 2015-08-26: qty 1000

## 2015-08-26 MED ORDER — FENTANYL CITRATE (PF) 100 MCG/2ML IJ SOLN
INTRAMUSCULAR | Status: DC | PRN
  Administered 2015-08-26: 150 ug via INTRAVENOUS
  Administered 2015-08-26 (×2): 50 ug via INTRAVENOUS

## 2015-08-26 MED ORDER — ONDANSETRON HCL 4 MG/2ML IJ SOLN
INTRAMUSCULAR | Status: AC
  Filled 2015-08-26: qty 2

## 2015-08-26 MED ORDER — SUCCINYLCHOLINE CHLORIDE 20 MG/ML IJ SOLN
INTRAMUSCULAR | Status: DC | PRN
  Administered 2015-08-26: 100 mg via INTRAVENOUS

## 2015-08-26 MED ORDER — KETOROLAC TROMETHAMINE 30 MG/ML IJ SOLN
INTRAMUSCULAR | Status: DC | PRN
  Administered 2015-08-26: 30 mg via INTRAVENOUS

## 2015-08-26 MED ORDER — MEPERIDINE HCL 25 MG/ML IJ SOLN
6.2500 mg | INTRAMUSCULAR | Status: DC | PRN
  Filled 2015-08-26: qty 1

## 2015-08-26 MED ORDER — LIDOCAINE HCL (CARDIAC) 20 MG/ML IV SOLN
INTRAVENOUS | Status: AC
  Filled 2015-08-26: qty 10

## 2015-08-26 MED ORDER — MIDAZOLAM HCL 2 MG/2ML IJ SOLN
INTRAMUSCULAR | Status: AC
  Filled 2015-08-26: qty 2

## 2015-08-26 MED ORDER — LIDOCAINE HCL (CARDIAC) 20 MG/ML IV SOLN
INTRAVENOUS | Status: DC | PRN
  Administered 2015-08-26: 100 mg via INTRAVENOUS

## 2015-08-26 MED ORDER — FLUORESCEIN SODIUM 10 % IV SOLN
INTRAVENOUS | Status: AC
  Filled 2015-08-26: qty 5

## 2015-08-26 MED ORDER — KETOROLAC TROMETHAMINE 30 MG/ML IJ SOLN
INTRAMUSCULAR | Status: AC
  Filled 2015-08-26: qty 1

## 2015-08-26 MED ORDER — DEXAMETHASONE SODIUM PHOSPHATE 4 MG/ML IJ SOLN
INTRAMUSCULAR | Status: DC | PRN
Start: 2015-08-26 — End: 2015-08-26
  Administered 2015-08-26: 10 mg via INTRAVENOUS

## 2015-08-26 MED ORDER — PROPOFOL 10 MG/ML IV BOLUS
INTRAVENOUS | Status: DC | PRN
  Administered 2015-08-26: 35 mg via INTRAVENOUS
  Administered 2015-08-26: 200 mg via INTRAVENOUS

## 2015-08-26 MED FILL — OXYCODONE-APAP 7.5-325 MG: 7.5-325 | 5 days supply | Qty: 30 | Fill #0

## 2015-08-26 SURGICAL SUPPLY — 66 items
ABLATOR ENDOMETRIAL BIPOLAR (ABLATOR) ×6 IMPLANT
APPLICATOR COTTON TIP 6IN STRL (MISCELLANEOUS) ×3 IMPLANT
BAG SPEC RTRVL LRG 6X4 10 (ENDOMECHANICALS)
BANDAGE ADH SHEER 1  50/CT (GAUZE/BANDAGES/DRESSINGS) ×3 IMPLANT
BANDAGE ADHESIVE 1X3 (GAUZE/BANDAGES/DRESSINGS) IMPLANT
BIPOLAR CUTTING LOOP 21FR (ELECTRODE)
BLADE SURG 11 STRL SS (BLADE) ×3 IMPLANT
CANISTER SUCTION 1200CC (MISCELLANEOUS) IMPLANT
CANISTER SUCTION 2500CC (MISCELLANEOUS) ×3 IMPLANT
CATH ROBINSON RED A/P 16FR (CATHETERS) IMPLANT
COVER BACK TABLE 60X90IN (DRAPES) ×3 IMPLANT
COVER MAYO STAND STRL (DRAPES) ×3 IMPLANT
DRAPE HYSTEROSCOPY (DRAPE) ×3 IMPLANT
DRAPE LG THREE QUARTER DISP (DRAPES) ×3 IMPLANT
DRAPE UNDERBUTTOCKS STRL (DRAPE) ×3 IMPLANT
DRSG TELFA 3X8 NADH (GAUZE/BANDAGES/DRESSINGS) ×3 IMPLANT
ELECT REM PT RETURN 9FT ADLT (ELECTROSURGICAL) ×3
ELECTRODE REM PT RTRN 9FT ADLT (ELECTROSURGICAL) ×2 IMPLANT
FILTER SMOKE EVAC LAPAROSHD (FILTER) IMPLANT
GAUZE VASELINE 1X8 (GAUZE/BANDAGES/DRESSINGS) IMPLANT
GLOVE BIO SURGEON STRL SZ7 (GLOVE) ×6 IMPLANT
GLOVE BIOGEL PI IND STRL 7.0 (GLOVE) ×2 IMPLANT
GLOVE BIOGEL PI IND STRL 7.5 (GLOVE) ×2 IMPLANT
GLOVE BIOGEL PI INDICATOR 7.0 (GLOVE) ×1
GLOVE BIOGEL PI INDICATOR 7.5 (GLOVE) ×1
GLOVE SURG SS PI 7.0 STRL IVOR (GLOVE) ×3 IMPLANT
GOWN STRL REUS W/ TWL LRG LVL3 (GOWN DISPOSABLE) ×4 IMPLANT
GOWN STRL REUS W/TWL LRG LVL3 (GOWN DISPOSABLE) ×4
GOWN STRL REUS W/TWL XL LVL3 (GOWN DISPOSABLE) ×6 IMPLANT
KIT ROOM TURNOVER WOR (KITS) ×3 IMPLANT
LEGGING LITHOTOMY PAIR STRL (DRAPES) ×3 IMPLANT
LIQUID BAND (GAUZE/BANDAGES/DRESSINGS) ×3 IMPLANT
LOOP CUTTING BIPOLAR 21FR (ELECTRODE) IMPLANT
MANIFOLD NEPTUNE II (INSTRUMENTS) IMPLANT
NEEDLE HYPO 25X1 1.5 SAFETY (NEEDLE) ×3 IMPLANT
NEEDLE INSUFFLATION 14GA 120MM (NEEDLE) IMPLANT
NEEDLE SPNL 18GX3.5 QUINCKE PK (NEEDLE) ×3 IMPLANT
NS IRRIG 500ML POUR BTL (IV SOLUTION) ×3 IMPLANT
PACK BASIN DAY SURGERY FS (CUSTOM PROCEDURE TRAY) ×3 IMPLANT
PACK LAPAROSCOPY II (CUSTOM PROCEDURE TRAY) ×3 IMPLANT
PAD OB MATERNITY 4.3X12.25 (PERSONAL CARE ITEMS) ×3 IMPLANT
PAD PREP 24X48 CUFFED NSTRL (MISCELLANEOUS) ×3 IMPLANT
POUCH SPECIMEN RETRIEVAL 10MM (ENDOMECHANICALS) IMPLANT
SCISSORS LAP 5X35 DISP (ENDOMECHANICALS) IMPLANT
SCISSORS LAP 5X45 EPIX DISP (ENDOMECHANICALS) ×3 IMPLANT
SEALER TISSUE G2 CVD JAW 35 (ENDOMECHANICALS) IMPLANT
SEALER TISSUE G2 CVD JAW 45CM (ENDOMECHANICALS) IMPLANT
SET IRRIG TUBING LAPAROSCOPIC (IRRIGATION / IRRIGATOR) IMPLANT
SOLUTION ANTI FOG 6CC (MISCELLANEOUS) ×3 IMPLANT
SUT VIC AB 3-0 PS2 18 (SUTURE) ×1
SUT VIC AB 3-0 PS2 18XBRD (SUTURE) ×2 IMPLANT
SUT VICRYL 0 ENDOLOOP (SUTURE) IMPLANT
SUT VICRYL 0 UR6 27IN ABS (SUTURE) IMPLANT
SUT VICRYL 4-0 PS2 18IN ABS (SUTURE) IMPLANT
SYR CONTROL 10ML LL (SYRINGE) ×3 IMPLANT
SYRINGE 10CC LL (SYRINGE) IMPLANT
TOWEL OR 17X24 6PK STRL BLUE (TOWEL DISPOSABLE) ×6 IMPLANT
TRAY DSU PREP LF (CUSTOM PROCEDURE TRAY) ×3 IMPLANT
TROCAR BALLN 12MMX100 BLUNT (TROCAR) IMPLANT
TROCAR OPTI TIP 5M 100M (ENDOMECHANICALS) ×3 IMPLANT
TROCAR XCEL DIL TIP R 11M (ENDOMECHANICALS) IMPLANT
TUBE CONNECTING 12X1/4 (SUCTIONS) IMPLANT
TUBING HYDROFLEX HYSTEROSCOPY (TUBING) ×3 IMPLANT
TUBING INSUFFLATION 10FT LAP (TUBING) ×3 IMPLANT
WARMER LAPAROSCOPE (MISCELLANEOUS) ×3 IMPLANT
WATER STERILE IRR 500ML POUR (IV SOLUTION) ×3 IMPLANT

## 2015-08-26 NOTE — Anesthesia Procedure Notes (Signed)
Procedure Name: Intubation Date/Time: 08/26/2015 7:34 AM Performed by: Wanita Chamberlain Pre-anesthesia Checklist: Patient identified, Timeout performed, Emergency Drugs available, Suction available and Patient being monitored Patient Re-evaluated:Patient Re-evaluated prior to inductionOxygen Delivery Method: Circle system utilized Preoxygenation: Pre-oxygenation with 100% oxygen Intubation Type: IV induction Ventilation: Mask ventilation without difficulty Grade View: Grade I Tube type: Oral Tube size: 7.0 mm Number of attempts: 1 Airway Equipment and Method: Patient positioned with wedge pillow and Stylet Placement Confirmation: breath sounds checked- equal and bilateral,  ETT inserted through vocal cords under direct vision and positive ETCO2 Secured at: 21 cm Tube secured with: Tape Dental Injury: Teeth and Oropharynx as per pre-operative assessment

## 2015-08-26 NOTE — Transfer of Care (Signed)
Immediate Anesthesia Transfer of Care Note  Patient: Erin [name]  Procedure(s) Performed: Procedure(s): DILATATION & CURETTAGE/HYSTEROSCOPY WITH NOVASURE ABLATION (N/A) LAPAROSCOPIC TUBAL LIGATION (Bilateral)  Patient Location: PACU  Anesthesia Type:General  Level of Consciousness: awake, alert , oriented and patient cooperative  Airway & Oxygen Therapy: Patient Spontanous Breathing and Patient connected to nasal cannula oxygen  Post-op Assessment: Report given to RN and Post -op Vital signs reviewed and stable  Post vital signs: Reviewed and stable  Last Vitals:  Filed Vitals:   08/26/15 0634  BP: 187/94  Pulse: 70  Temp: 36.4 C  Resp: 18    Last Pain: There were no vitals filed for this visit.       Complications: No apparent anesthesia complications

## 2015-08-26 NOTE — Anesthesia Postprocedure Evaluation (Signed)
Anesthesia Post Note  Patient: AISHA GREENBERGER  Procedure(s) Performed: Procedure(s) (LRB): DILATATION & CURETTAGE/HYSTEROSCOPY WITH NOVASURE ABLATION (N/A) LAPAROSCOPIC TUBAL LIGATION (Bilateral)  Patient location during evaluation: PACU Anesthesia Type: General Level of consciousness: awake and alert and oriented Pain management: pain level controlled Vital Signs Assessment: post-procedure vital signs reviewed and stable Respiratory status: spontaneous breathing, nonlabored ventilation and respiratory function stable Cardiovascular status: blood pressure returned to baseline and stable Postop Assessment: no signs of nausea or vomiting Anesthetic complications: no    Last Vitals:  Filed Vitals:   08/26/15 0900 08/26/15 0915  BP: 168/83 161/75  Pulse: 77 85  Temp:    Resp: 19 15    Last Pain:  Filed Vitals:   08/26/15 0925  PainSc: 6                  Fairley Copher A.

## 2015-08-26 NOTE — Brief Op Note (Signed)
08/26/2015  8:42 AM  PATIENT:  Erin [name]  50 y.o. female  PRE-OPERATIVE DIAGNOSIS:  menrorhagia, desires sterility  POST-OPERATIVE DIAGNOSIS:  menrorhagia, desires sterility  PROCEDURE:  Procedure(s): DILATATION & CURETTAGE/HYSTEROSCOPY WITH NOVASURE ABLATION (N/A) LAPAROSCOPIC TUBAL LIGATION (Bilateral)  SURGEON:  Surgeon(s) and Role:    * [name], MD - Primary  PHYSICIAN ASSISTANT:   ASSISTANTS: none   ANESTHESIA:   general and paracervical block  EBL:  Total I/O In: 1000 [I.V.:1000] Out: 25 [Blood:25]  BLOOD ADMINISTERED:none  DRAINS: none   LOCAL MEDICATIONS USED:  MARCAINE    and XYLOCAINE   SPECIMEN:  Source of Specimen:  endometrial currettings  DISPOSITION OF SPECIMEN:  PATHOLOGY  COUNTS:  YES  TOURNIQUET:  * No tourniquets in log *  DICTATION: .Note written in EPIC and Other Dictation: Dictation Number ?  PLAN OF CARE: Discharge to home after PACU  PATIENT DISPOSITION:  PACU - hemodynamically stable.   Delay start of Pharmacological VTE agent (>24hrs) due to surgical blood loss or risk of bleeding: not applicable

## 2015-08-26 NOTE — Op Note (Signed)
NAME:  Erin [name], Erin [name] NO.:  [id number]  MEDICAL RECORD NO.:  [id number]  LOCATION:                                 FACILITY:  PHYSICIAN:  Darlyn Chamber, M.D.        DATE OF BIRTH:  DATE OF PROCEDURE:  08/26/2015 DATE OF DISCHARGE:                              OPERATIVE REPORT   PREOPERATIVE DIAGNOSES: 1. Multiparity, desires sterility. 2. Menorrhagia secondary to adenomyosis.  POSTOPERATIVE DIAGNOSES: 1. Multiparity, desires sterility. 2. Menorrhagia secondary to adenomyosis with addition of pelvic     adhesions.  OPERATIVE PROCEDURES:  Paracervical block.  Hysteroscopy with endometrial biopsy.  NovaSure ablation.  Diagnostic laparoscopy with lysis of pelvic adhesions.  Bilateral tubal fulguration.  SURGEON:  Darlyn Chamber, M.D.  ANESTHESIA:  Paracervical block and general anesthesia.  ESTIMATED BLOOD LOSS:  Minimal.  PACKS:  None.  DRAINS:  None.  INTRAOPERATIVE BLOOD PLACED:  None.  COMPLICATIONS:  None.  INDICATION:  Dictated in the history and physical.  PROCEDURE IN DETAIL:  The patient was taken to the OR and placed in the supine position.  After satisfactory level of general endotracheal anesthesia was obtained, the patient was placed in the dorsal lithotomy position using the Allen stirrups.  The abdomen, perineum and vagina were prepped out with Betadine.  The patient was then draped in a sterile field.  Speculum was placed in the vaginal vault.  The cervix was grasped with single-tooth tenaculum.  Paracervical block 1% Xylocaine was instituted.  Uterus was sounded to 9 cm.  Endocervical length was 4.5 cm, given [place] a 5-mm endocervical length.  Subsequently, hysteroscopy was performed.  Endometrium was completely unremarkable. Endometrial curettings were obtained and sent for pathological review. NovaSure was put in place and properly deployed.  Total width was 3.6 cm.  We passed the CO2 patency test.  Ablation was then undertaken  for about 1 minute and 10 seconds.  The NovaSure was then removed.  Repeat hysteroscopy revealed pretty much universal ablation.  At this point in time, a Hulka tenaculum was put in place.  A single-tooth and speculum were then removed.  Bladder was emptied by in-and-out catheterization.  Subumbilical incision was made with knife.  The Veress needle was introduced into the abdominal cavity.  The abdomen was inflated with approximately 3 L of carbon dioxide.  The 10/11 trocar was then inserted.  Laparoscope was introduced.  There was no evidence of injury to adjacent organs.  Uterus was then elevated.  She had adhesions to the left tube and ovary from epiploic from the large intestines.  The right tube and ovary were unremarkable except for one small adhesion, this was taken down.  The right tube was then fulgurated for distance of 3 cm. Fulguration was continued until resistance read 0.  The same area was then re-coagulated.  We then went to the left side.  We got the monopolar scissors out.  We put a 5-mm trocar in the suprapubic area. We then took down the adhesions from the epiploic to the left fallopian tube.  The left ovary was elevated, did have a simple cyst at its distal end.  Next, the bipolar was used to cauterize at  3 cm segment of tube, cauterization was continued to rebuilt and read 0.  Segment tube was then re-coagulated.  Both tubes were adequately coagulated.  There were no signs of injury to adjacent organs.  At this point in time, the 5-mm trocars were removed.  The abdomen was de-inflated with carbon dioxide. The 10/11 trocar was then removed.  Subumbilical incision was closed with interrupted subcuticulars of 3-0 Vicryl.  The suprapubic incision was closed with Dermabond.  The Hulka tenaculum was then removed.  The patient was taken out of the dorsal lithotomy position.  Once alert and extubated, transferred to the recovery room in good condition.  Sponge, instrument,  and needle count were reported as correct by circulating nurse x2.     Darlyn Chamber, M.D.     JSM/MEDQ  D:  08/26/2015  T:  08/26/2015  Job:  [id number]

## 2015-08-26 NOTE — Discharge Instructions (Signed)
D & C Home care Instructions:   Personal hygiene:  Used sanitary napkins for vaginal drainage not tampons. Shower or tub bathe the day after your procedure. No douching until bleeding stops. Always wipe from front to back after  Elimination.  Activity: Do not drive or operate any equipment today. The effects of the anesthesia are still present and drowsiness may result. Rest today, not necessarily flat bed rest, just take it easy. You may resume your normal activity in one to 2 days.  Sexual activity: No intercourse for one week or as indicated by your physician  Diet: Eat a light diet as desired this evening. You may resume a regular diet tomorrow.  Return to work: One to 2 days.  General Expectations of your surgery: Vaginal bleeding should be no heavier than a normal period. Spotting may continue up to 10 days. Mild cramps may continue for a couple of days. You may have a regular period in 2-6 weeks.  Unexpected observations call your doctor if these occur: persistent or heavy bleeding. Severe abdominal cramping or pain. Elevation of temperature greater than 100F.  Call for an appointment in one week.    Patient's Signature_______________________________________________________  Nurse's Signature________________________________________________________  HOME CARE INSTRUCTIONS - LAPAROSCOPY  Wound Care: The bandaids or dressing which are placed over the skin openings may be removed the day after surgery. The incision should be kept clean and dry. The stitches do not need to be removed. Should the incision become sore, red, and swollen after the first week, check with your doctor.  Personal Hygiene: Shower the day after your procedure. Always wipe from front to back after elimination.   Activity: Do not drive or operate any equipment today. The effects of the anesthesia are still present and drowsiness may result. Rest today, not necessarily flat bed rest, just take it easy. You  may resume your normal activity in one to three days or as instructed by your physician.  Sexual Activity: You resume sexual activity as indicated by your physician_________. If your laparoscopy was for a sterilization ( tubes tied ), continue current method of birth control until after your next period or ask for specific instructions from your doctor.  Diet: Eat a light diet as desired this evening. You may resume a regular diet tomorrow.  Return to Work: Two to three days or as indicated by your doctor.  Expectations After Surgery: Your surgery will cause vaginal drainage or spotting which may continue for 2-3 days. Mild abdominal discomfort or tenderness is not unusual and some shoulder pain may also be noted which can be relieved by lying flat in pain.  Call Your Doctor If these Occur:  Persistent or heavy bleeding at incision site       Redness or swelling around incision       Elevation of temperature greater than 100 degrees F  Call for follow-up appointment _____________. Post Anesthesia Home Care Instructions  Activity: Get plenty of rest for the remainder of the day. A responsible adult should stay with you for 24 hours following the procedure.  For the next 24 hours, DO NOT: -Drive a car -Paediatric nurse -Drink alcoholic beverages -Take any medication unless instructed by your physician -Make any legal decisions or sign important papers.  Meals: Start with liquid foods such as gelatin or soup. Progress to regular foods as tolerated. Avoid greasy, spicy, heavy foods. If nausea and/or vomiting occur, drink only clear liquids until the nausea and/or vomiting subsides. Call your physician if  vomiting continues.  Special Instructions/Symptoms: Your throat may feel dry or sore from the anesthesia or the breathing tube placed in your throat during surgery. If this causes discomfort, gargle with warm salt water. The discomfort should disappear within 24 hours.  If you had a  scopolamine patch placed behind your ear for the management of post- operative nausea and/or vomiting:  1. The medication in the patch is effective for 72 hours, after which it should be removed.  Wrap patch in a tissue and discard in the trash. Wash hands thoroughly with soap and water. 2. You may remove the patch earlier than 72 hours if you experience unpleasant side effects which may include dry mouth, dizziness or visual disturbances. 3. Avoid touching the patch. Wash your hands with soap and water after contact with the patch.

## 2015-08-27 ENCOUNTER — Encounter (HOSPITAL_BASED_OUTPATIENT_CLINIC_OR_DEPARTMENT_OTHER): Payer: Self-pay | Admitting: Obstetrics and Gynecology

## 2015-09-06 MED FILL — CITALOPRAM HBR 20 MG TABLET: 20 | 30 days supply | Qty: 30 | Fill #7

## 2015-09-06 MED FILL — OMEPRAZOLE DR 40 MG CAPSULE: 40 | 30 days supply | Qty: 60 | Fill #1

## 2015-09-06 MED FILL — metFORMIN HCL 500 MG TABS: 500 | 30 days supply | Qty: 60 | Fill #1

## 2015-09-16 DIAGNOSIS — R112 Nausea with vomiting, unspecified: Secondary | ICD-10-CM | POA: Diagnosis not present

## 2015-10-10 MED FILL — metFORMIN HCL 500 MG TABS: 500 | 30 days supply | Qty: 60 | Fill #2

## 2015-10-10 MED FILL — CITALOPRAM HBR 20 MG TABLET: 20 | 30 days supply | Qty: 30 | Fill #8

## 2015-10-10 MED FILL — OMEPRAZOLE DR 40 MG CAPSULE: 40 | 30 days supply | Qty: 60 | Fill #0

## 2015-10-10 MED FILL — OLMESARTAN-HCTZ 40-25 MG TA: 40-25 | 90 days supply | Qty: 90 | Fill #2

## 2015-11-15 MED FILL — CITALOPRAM HBR 20 MG TABLET: 20 | 30 days supply | Qty: 30 | Fill #9

## 2015-11-15 MED FILL — metFORMIN HCL 500 MG TABS: 500 | 30 days supply | Qty: 60 | Fill #3

## 2015-12-03 MED FILL — OMEPRAZOLE DR 40 MG CAPSULE: 40 | 30 days supply | Qty: 60 | Fill #0

## 2015-12-11 DIAGNOSIS — Z01419 Encounter for gynecological examination (general) (routine) without abnormal findings: Secondary | ICD-10-CM | POA: Diagnosis not present

## 2015-12-11 DIAGNOSIS — Z1231 Encounter for screening mammogram for malignant neoplasm of breast: Secondary | ICD-10-CM | POA: Diagnosis not present

## 2015-12-18 MED FILL — metFORMIN HCL 500 MG TABS: 500 | 30 days supply | Qty: 60 | Fill #4

## 2015-12-23 MED FILL — CITALOPRAM HBR 20 MG TABLET: 20 | 30 days supply | Qty: 30 | Fill #0

## 2015-12-27 DIAGNOSIS — N951 Menopausal and female climacteric states: Secondary | ICD-10-CM | POA: Diagnosis not present

## 2016-01-01 DIAGNOSIS — Z1211 Encounter for screening for malignant neoplasm of colon: Secondary | ICD-10-CM | POA: Diagnosis not present

## 2016-01-01 DIAGNOSIS — R112 Nausea with vomiting, unspecified: Secondary | ICD-10-CM | POA: Diagnosis not present

## 2016-01-05 NOTE — Progress Notes (Deleted)
Cardiology Office Note   Date:  01/05/2016   ID:  Erin [name], DOB 1965/02/11, MRN [id number]  PCP:  Harvie Junior, MD  Cardiologist:   Dorris Carnes, MD       History of Present Illness: Erin [name] is a 50 y.o. female with a history of       Outpatient Medications Prior to Visit  Medication Sig Dispense Refill  . Cholecalciferol (VITAMIN D3) 2000 units TABS Take 1 tablet by mouth every morning.    . citalopram (CELEXA) 20 MG tablet Take 20 mg by mouth every morning.    . metFORMIN (GLUCOPHAGE) 500 MG tablet Take by mouth daily with breakfast.    . olmesartan-hydrochlorothiazide (BENICAR HCT) 40-25 MG tablet Take 1 tablet by mouth every morning.    [name] omeprazole (PRILOSEC) 40 MG capsule Take 40 mg by mouth every morning.    [name] oxyCODONE-acetaminophen (PERCOCET) 7.5-325 MG tablet Take 1 tablet by mouth every 4 (four) hours as needed for severe pain. 30 tablet 0   No facility-administered medications prior to visit.      Allergies:   Patient has no known allergies.   Past Medical History:  Diagnosis Date  . Anxiety   . Depression   . GERD (gastroesophageal reflux disease)   . Menorrhagia   . OSA on CPAP   . Type 2 diabetes mellitus ([place])   . Wears glasses     Past Surgical History:  Procedure Laterality Date  . DILITATION & CURRETTAGE/HYSTROSCOPY WITH NOVASURE ABLATION N/A 08/26/2015   Procedure: DILATATION & CURETTAGE/HYSTEROSCOPY WITH NOVASURE ABLATION;  Surgeon: [name], MD;  Location: [facility];  Service: Gynecology;  Laterality: N/A;  . EXCISIONAL LEFT BREAST BX  11-14-2007   benign  . LAPAROSCOPIC TUBAL LIGATION Bilateral 08/26/2015   Procedure: LAPAROSCOPIC TUBAL LIGATION;  Surgeon: [name], MD;  Location: [facility];  Service: Gynecology;  Laterality: Bilateral;  . THYROID LOBECTOMY Right 04-30-2008     Social History:  The patient  reports that she has quit smoking. Her smoking use included  Cigarettes. She quit after 15.00 years of use. She has never used smokeless tobacco. She reports that she does not drink alcohol or use drugs.   Family History:  The patient's family history is not on file.    ROS:  Please see the history of present illness. All other systems are reviewed and  Negative to the above problem except as noted.    PHYSICAL EXAM: VS:  There were no vitals taken for this visit.  GEN: Well nourished, well developed, in no acute distress HEENT: normal Neck: no JVD, carotid bruits, or masses Cardiac: RRR; no murmurs, rubs, or gallops,no edema  Respiratory:  clear to auscultation bilaterally, normal work of breathing GI: soft, nontender, nondistended, + BS  No hepatomegaly  MS: no deformity Moving all extremities   Skin: warm and dry, no rash Neuro:  Strength and sensation are intact Psych: euthymic mood, full affect   EKG:  EKG is ordered today.   Lipid Panel    Component Value Date/Time   CHOL 152 05/20/2006 1135   TRIG 63 05/20/2006 1135   HDL 37.4 (L) 05/20/2006 1135   CHOLHDL 4.1 CALC 05/20/2006 1135   VLDL 13 05/20/2006 1135   LDLCALC 102 (H) 05/20/2006 1135      Wt Readings from Last 3 Encounters:  08/26/15 234 lb (106.1 kg)      ASSESSMENT AND PLAN:     Current medicines  are reviewed at length with the patient today.  The patient does not have concerns regarding medicines.  Signed, Dorris Carnes, MD  01/05/2016 4:56 PM    [address], [place], [place]  [address] Phone: [phone]; Fax: [phone]

## 2016-01-06 ENCOUNTER — Ambulatory Visit: Payer: 59 | Admitting: Internal Medicine

## 2016-01-07 ENCOUNTER — Encounter: Payer: Self-pay | Admitting: Internal Medicine

## 2016-01-08 DIAGNOSIS — R232 Flushing: Secondary | ICD-10-CM | POA: Diagnosis not present

## 2016-01-08 DIAGNOSIS — N951 Menopausal and female climacteric states: Secondary | ICD-10-CM | POA: Diagnosis not present

## 2016-01-08 MED FILL — AMLODIPINE BESYLATE 5 MG TA: 5 | 30 days supply | Qty: 30 | Fill #0

## 2016-01-15 MED FILL — OMEPRAZOLE DR 40 MG CAPSULE: 40 | 30 days supply | Qty: 60 | Fill #1

## 2016-01-27 ENCOUNTER — Ambulatory Visit: Payer: 59 | Admitting: Internal Medicine

## 2016-02-04 ENCOUNTER — Encounter: Payer: Self-pay | Admitting: Internal Medicine

## 2016-02-11 MED FILL — AMLODIPINE BESYLATE 5 MG TA: 5 | 30 days supply | Qty: 30 | Fill #1

## 2016-02-11 MED FILL — CITALOPRAM HBR 20 MG TABLET: 20 | 30 days supply | Qty: 30 | Fill #0

## 2016-03-05 ENCOUNTER — Other Ambulatory Visit: Payer: Self-pay | Admitting: Gastroenterology

## 2016-03-09 MED FILL — CITALOPRAM HBR 20 MG TABLET: 20 | 30 days supply | Qty: 30 | Fill #1

## 2016-03-09 MED FILL — metFORMIN HCL 500 MG TABS: 500 | 30 days supply | Qty: 60 | Fill #5

## 2016-03-09 MED FILL — AMLODIPINE BESYLATE 5 MG TA: 5 | 30 days supply | Qty: 30 | Fill #2

## 2016-03-12 ENCOUNTER — Ambulatory Visit (HOSPITAL_COMMUNITY): Admit: 2016-03-12 | Payer: 59 | Admitting: Gastroenterology

## 2016-03-12 ENCOUNTER — Encounter (HOSPITAL_COMMUNITY): Payer: Self-pay

## 2016-03-12 SURGERY — ESOPHAGOGASTRODUODENOSCOPY (EGD) WITH PROPOFOL
Anesthesia: Monitor Anesthesia Care

## 2016-03-16 ENCOUNTER — Ambulatory Visit (INDEPENDENT_AMBULATORY_CARE_PROVIDER_SITE_OTHER): Payer: 59 | Admitting: Internal Medicine

## 2016-03-16 ENCOUNTER — Encounter (INDEPENDENT_AMBULATORY_CARE_PROVIDER_SITE_OTHER): Payer: Self-pay

## 2016-03-16 ENCOUNTER — Encounter: Payer: Self-pay | Admitting: Internal Medicine

## 2016-03-16 VITALS — BP 140/70 | HR 73 | Ht 63.0 in | Wt 225.0 lb

## 2016-03-16 DIAGNOSIS — I10 Essential (primary) hypertension: Secondary | ICD-10-CM | POA: Diagnosis not present

## 2016-03-16 MED ORDER — AMLODIPINE BESYLATE 5 MG PO TABS: 5.0000 mg | ORAL_TABLET | Freq: Two times a day (BID) | ORAL | 3 refills | Status: DC

## 2016-03-16 NOTE — Patient Instructions (Signed)
Your physician has recommended you make the following change in your medication:  1.) increase amlodipine '5mg'$  to two times per day.  If tolerating, you may switch to all 10 mg once a day  Your physician recommends that you schedule a follow-up appointment in: May, 2018 with Dr. Harrington Challenger.  Please call in the meantime for any needs.

## 2016-03-16 NOTE — Progress Notes (Signed)
Cardiology Office Note   Date:  03/17/2016   ID:  Erin [name], DOB 01/17/1966, MRN [id number]  PCP:  Harvie Junior, MD  Cardiologist:   Dorris Carnes, MD   Pt referred for HTN    History of Present Illness: Erin [name] is a 51 y.o. female with a history of DM and HTN  Seen in [facility]  BP was high at 168/98   Pt als with strong FHx fo CAD   The pt was started on amlodipine 5 mg  BP got better  The pt has stopped her benicar HCTZ  Says it never helped much when she took  It.  Says her BP has been OK since then  She denies CP  Breathing is OK     Current Meds  Medication Sig  . citalopram (CELEXA) 20 MG tablet Take 20 mg by mouth every morning.  . metFORMIN (GLUCOPHAGE) 500 MG tablet Take by mouth daily with breakfast.  . omeprazole (PRILOSEC) 40 MG capsule Take 40 mg by mouth every morning.  . [DISCONTINUED] amLODipine (NORVASC) 5 MG tablet Take 5 mg by mouth 2 (two) times daily.     Allergies:   Patient has no known allergies.   Past Medical History:  Diagnosis Date  . Anxiety   . Depression   . GERD (gastroesophageal reflux disease)   . Menorrhagia   . OSA on CPAP   . Type 2 diabetes mellitus ([place])   . Wears glasses     Past Surgical History:  Procedure Laterality Date  . DILITATION & CURRETTAGE/HYSTROSCOPY WITH NOVASURE ABLATION N/A 08/26/2015   Procedure: DILATATION & CURETTAGE/HYSTEROSCOPY WITH NOVASURE ABLATION;  Surgeon: [name], MD;  Location: [facility];  Service: Gynecology;  Laterality: N/A;  . EXCISIONAL LEFT BREAST BX  11-14-2007   benign  . LAPAROSCOPIC TUBAL LIGATION Bilateral 08/26/2015   Procedure: LAPAROSCOPIC TUBAL LIGATION;  Surgeon: [name], MD;  Location: [facility];  Service: Gynecology;  Laterality: Bilateral;  . THYROID LOBECTOMY Right 04-30-2008     Social History:  The patient  reports that she has quit smoking. Her smoking use included Cigarettes. She quit after 15.00 years of  use. She has never used smokeless tobacco. She reports that she does not drink alcohol or use drugs.   Family History:  The patient's family history includes Diabetes in her mother; Stroke in her mother.    ROS:  Please see the history of present illness. All other systems are reviewed and  Negative to the above problem except as noted.    PHYSICAL EXAM: VS:  BP 140/70   Pulse 73   Ht '5\' 3"'$  (1.6 m)   Wt 225 lb (102.1 kg)   SpO2 96%   BMI 39.86 kg/m   GEN: Well nourished, well developed, in no acute distress  HEENT: normal  Neck: no JVD, carotid bruits, or masses Cardiac: RRR; no murmurs, rubs, or gallops,no edema  Respiratory:  clear to auscultation bilaterally, normal work of breathing GI: soft, nontender, nondistended, + BS  No hepatomegaly  MS: no deformity Moving all extremities   Skin: warm and dry, no rash Neuro:  Strength and sensation are intact Psych: euthymic mood, full affect   EKG:  EKG is not ordered today.  On 08/26/15 SR with Occasional PVC   Lipid Panel    Component Value Date/Time   CHOL 152 05/20/2006 1135   TRIG 63 05/20/2006 1135   HDL 37.4 (L) 05/20/2006 1135  CHOLHDL 4.1 CALC 05/20/2006 1135   VLDL 13 05/20/2006 1135   LDLCALC 102 (H) 05/20/2006 1135      Wt Readings from Last 3 Encounters:  03/16/16 225 lb (102.1 kg)  08/26/15 234 lb (106.1 kg)      ASSESSMENT AND PLAN:  1  HTN  BP is better on amlodipine alone  I would recomm increasing to 5bid  Can consolidate to 10     F/U with BPs F/U in May Will need to review lipids    Current medicines are reviewed at length with the patient today.  The patient does not have concerns regarding medicines.  Signed, Dorris Carnes, MD  03/17/2016 8:18 PM    [facility] [address], [place], [place]  [address] Phone: [phone]; Fax: [phone]

## 2016-03-25 MED FILL — OMEPRAZOLE DR 40 MG CAPSULE: 40 | 30 days supply | Qty: 60 | Fill #0

## 2016-04-01 ENCOUNTER — Telehealth: Payer: Self-pay | Admitting: Internal Medicine

## 2016-04-01 ENCOUNTER — Telehealth: Payer: Self-pay | Admitting: *Deleted

## 2016-04-01 NOTE — Telephone Encounter (Signed)
Cut back amlodipine to 5   Add back Benicar/HCTZ  Please arrange for BP check in clinic

## 2016-04-01 NOTE — Telephone Encounter (Signed)
Walk In pt Form- pt needs call back ASAP-took to Message Nurse

## 2016-04-01 NOTE — Telephone Encounter (Signed)
Received walk in patient form, wants to know if she can start taking benicar again.    She said she was taken off and norvasc was increased.   States she has gained a lot of fluid in legs mainly left side and she is worried.    Will forward to Dr. Harrington Challenger for any new recommendations.

## 2016-04-02 MED ORDER — AMLODIPINE BESYLATE 5 MG PO TABS: 5.0000 mg | ORAL_TABLET | Freq: Every day | ORAL | 3 refills | Status: DC

## 2016-04-02 MED ORDER — OLMESARTAN MEDOXOMIL-HCTZ 40-25 MG PO TABS: 1.0000 | ORAL_TABLET | Freq: Every day | ORAL | 11 refills | Status: DC

## 2016-04-02 MED FILL — OLMESARTAN-HCTZ 40-25 MG TA: 40-25 | 30 days supply | Qty: 30 | Fill #0

## 2016-04-02 NOTE — Telephone Encounter (Signed)
Spoke with patient. Advised to decrease amlodipine to 5 mg once a day from BID. Advised that I have sent new prescription for Benicar/hctz 40-25 mg (her previous dose) to her pharmacy and that she is to restart this medication. Scheduled appointment with HTN clinic pharmacist for 04/13/16 at [facility] to decrease the amount of sodium in her diet, try wearing support stockings and elevated legs whenever possible.   Pt is appreciative for all information provided.

## 2016-04-09 DIAGNOSIS — S335XXA Sprain of ligaments of lumbar spine, initial encounter: Secondary | ICD-10-CM | POA: Diagnosis not present

## 2016-04-09 DIAGNOSIS — M5416 Radiculopathy, lumbar region: Secondary | ICD-10-CM | POA: Diagnosis not present

## 2016-04-09 MED FILL — CYCLOBENZAPRINE 5 MG TABLET: 5 | 10 days supply | Qty: 30 | Fill #0

## 2016-04-09 MED FILL — HYDROCODON-APAP 5-325: 5-325 | 3 days supply | Qty: 10 | Fill #0

## 2016-04-09 MED FILL — predniSONE 10 MG TABS: 10 | 6 days supply | Qty: 21 | Fill #0

## 2016-04-13 ENCOUNTER — Ambulatory Visit (INDEPENDENT_AMBULATORY_CARE_PROVIDER_SITE_OTHER): Payer: 59 | Admitting: Pharmacist

## 2016-04-13 DIAGNOSIS — I10 Essential (primary) hypertension: Secondary | ICD-10-CM | POA: Diagnosis not present

## 2016-04-13 NOTE — Progress Notes (Signed)
Patient ID: Erin [name]                 DOB: 02-20-1965                      MRN: [id number]     HPI: Erin [name] is a 51 y.o. female patient of Dr. Harrington Challenger with [place] below who presents today for hypertension evaluation. At her appointment with Dr. Harrington Challenger 1 month ago, her pressure was 140/70, HR 73. She had not been taking her olmesartan/HCTZ, but was taking amlodipine 5 mg. Dr. Harrington Challenger increased amlodipine to 10 mg daily. 2 weeks ago, patient called and reported significant LEE, so amlodipine was decreased to 5 mg daily and olmesartan/HCTZ was restarted. She was also advised to decrease the amount of sodium in her diet and to try compression stockings or elevating her legs to reduce the swelling.  Patient presents today with her medications. She notes that she hurt her leg recently and activities have been limited by that. She is currently taking a course of prednisone and hydrocodone for the pain. She is in a significant amount of pain today due to her recent injury.   Today, she states that she is feeling much better now that she amlodipine is back to 5 mg and she has less ankle swelling.   Cardiac Hx: HTN  Current HTN meds:  Amlodipine 5 mg daily Olmesartan/HCTZ 40/25 mg daily   Previously tried: Benazepril - cough  BP goal: <130/80  Family History: Mother- stroke, diabetes  Social History: Patient reports that she quit smoking after 15 years in 2000. She does not drink alcohol.   Diet: Patient notes that she eats fast foods mostly ([employer], Wendy's) and eats sandwiches. She does not add salt to her meals. She drinks sweet tea when she eats out, so typically 2 cups a week.   Exercise: Does not partake in formal exercise, but walks a lot at work. She does note that she has lost 100+ lbs after starting metformin.   Home BP readings: She does use a wrist BP cuff, but doesn't record the numbers. She remembers a 173/87ish, but this was before restarting the Benicar.   Wt  Readings from Last 3 Encounters:  03/16/16 225 lb (102.1 kg)  08/26/15 234 lb (106.1 kg)   BP Readings from Last 3 Encounters:  04/13/16 (!) 150/90  03/16/16 140/70  08/26/15 (!) 150/80   Pulse Readings from Last 3 Encounters:  04/13/16 66  03/16/16 73  08/26/15 81    Renal function: CrCl cannot be calculated (Patient's most recent lab result is older than the maximum 21 days allowed.).  Past Medical History:  Diagnosis Date  . Anxiety   . Depression   . GERD (gastroesophageal reflux disease)   . Menorrhagia   . OSA on CPAP   . Type 2 diabetes mellitus ([place])   . Wears glasses     Current Outpatient Prescriptions on File Prior to Visit  Medication Sig Dispense Refill  . amLODipine (NORVASC) 5 MG tablet Take 1 tablet (5 mg total) by mouth daily. 180 tablet 3  . citalopram (CELEXA) 20 MG tablet Take 20 mg by mouth every morning.    . metFORMIN (GLUCOPHAGE) 500 MG tablet Take by mouth daily with breakfast.    . olmesartan-hydrochlorothiazide (BENICAR HCT) 40-25 MG tablet Take 1 tablet by mouth daily. 30 tablet 11  . omeprazole (PRILOSEC) 40 MG capsule Take 40 mg by mouth every morning.  No current facility-administered medications on file prior to visit.     No Known Allergies  Blood pressure (!) 150/90, pulse 66, SpO2 97 %.   Assessment/Plan: Hypertension: Blood pressure is still uncontrolled today, but the prednisone and concurrent pain could be contributing to this elevation. 1. Continue olmesartan/HCTZ and amlodipine 5 mg daily 2. BMET showed stable kidney function. Potassium remains low at 3.1. K-dur 10 mEq was started for supplementation. Repeat BMET in 2 weeks to evaluate potassium. We discussed these results with the patient and she expressed understanding.  3. If blood pressure is still uncontrolled in the future, we could consider increasing amlodipine to 7.5 mg daily or change HCTZ to the more potent chlorthalidone or start spironolactone.   Follow up in  hypertension clinic in 3 weeks.   Patient was seen with Catie Darnelle Maffucci, PharmD Candidate  Thank you, [name]. Patterson Hammersmith, [facility]  04/13/2016 4:31 PM

## 2016-04-13 NOTE — Progress Notes (Deleted)
Patient ID: Erin [name]                 DOB: 1965-09-05                      MRN: [id number]     HPI: Erin [name] is a 51 y.o. female patient of Dr. Harrington Challenger with [place] below who presents today for hypertension evaluation. PMH includes DM and HTN. At her most recent OV with Dr. Harrington Challenger she reported being off Benicar/HCTZ. Her pressure was elevated to 140/70 and her amlodipine was increased to '5mg'$  BID. She developed swelling and her dose was decreased back to '5mg'$  daily.    Current HTN meds:  Olmesartan/HCTZ 40-25 mg daily Amlodipine '5mg'$  daily  Previously tried:  Higher doses of amlodipine   BP goal: <130/80  Family History:   Social History:   Diet:   Exercise:   Home BP readings:   Wt Readings from Last 3 Encounters:  03/16/16 225 lb (102.1 kg)  08/26/15 234 lb (106.1 kg)   BP Readings from Last 3 Encounters:  03/16/16 140/70  08/26/15 (!) 150/80   Pulse Readings from Last 3 Encounters:  03/16/16 73  08/26/15 81    Renal function: CrCl cannot be calculated (Patient's most recent lab result is older than the maximum 21 days allowed.).  Past Medical History:  Diagnosis Date  . Anxiety   . Depression   . GERD (gastroesophageal reflux disease)   . Menorrhagia   . OSA on CPAP   . Type 2 diabetes mellitus ([place])   . Wears glasses     Current Outpatient Prescriptions on File Prior to Visit  Medication Sig Dispense Refill  . amLODipine (NORVASC) 5 MG tablet Take 1 tablet (5 mg total) by mouth daily. 180 tablet 3  . citalopram (CELEXA) 20 MG tablet Take 20 mg by mouth every morning.    . metFORMIN (GLUCOPHAGE) 500 MG tablet Take by mouth daily with breakfast.    . olmesartan-hydrochlorothiazide (BENICAR HCT) 40-25 MG tablet Take 1 tablet by mouth daily. 30 tablet 11  . omeprazole (PRILOSEC) 40 MG capsule Take 40 mg by mouth every morning.     No current facility-administered medications on file prior to visit.     No Known Allergies  There were no vitals  taken for this visit.   Assessment/Plan: Hypertension:    Thank you, [name]. Patterson Hammersmith, [facility]  04/13/2016 9:15 AM

## 2016-04-13 NOTE — Patient Instructions (Signed)
Return for a a follow up appointment in 4 weeks.  Your blood pressure today is 150/90  Check your blood pressure at home daily (if able) and keep record of the readings.  Take your BP meds as follows: olmesartan/HCTZ 40/25 mg and amlodipine 5 mg daily.   Bring all of your meds, your BP cuff and your record of home blood pressures to your next appointment.  Exercise as you're able, try to walk approximately 30 minutes per day.  Keep salt intake to a minimum, especially watch canned and prepared boxed foods.  Eat more fresh fruits and vegetables and fewer canned items.  Avoid eating in fast food restaurants.    HOW TO TAKE YOUR BLOOD PRESSURE: . Rest 5 minutes before taking your blood pressure. .  Don't smoke or drink caffeinated beverages for at least 30 minutes before. . Take your blood pressure before (not after) you eat. . Sit comfortably with your back supported and both feet on the floor (don't cross your legs). . Elevate your arm to heart level on a table or a desk. . Use the proper sized cuff. It should fit smoothly and snugly around your bare upper arm. There should be enough room to slip a fingertip under the cuff. The bottom edge of the cuff should be 1 inch above the crease of the elbow. . Ideally, take 3 measurements at one sitting and record the average.

## 2016-04-14 LAB — BASIC METABOLIC PANEL
BUN/Creatinine Ratio: 17 (ref 9–23)
BUN: 18 mg/dL (ref 6–24)
CALCIUM: 9.7 mg/dL (ref 8.7–10.2)
CO2: 25 mmol/L (ref 18–29)
Chloride: 98 mmol/L (ref 96–106)
Creatinine, Ser: 1.03 mg/dL — ABNORMAL HIGH (ref 0.57–1.00)
GFR calc Af Amer: 73 mL/min/{1.73_m2} (ref >59)
GFR, EST NON AFRICAN AMERICAN: 64 mL/min/{1.73_m2} (ref >59)
Glucose: 99 mg/dL (ref 65–99)
POTASSIUM: 3.1 mmol/L — AB (ref 3.5–5.2)
Sodium: 141 mmol/L (ref 134–144)

## 2016-04-14 MED ORDER — POTASSIUM CHLORIDE ER 10 MEQ PO TBCR
10.0000 meq | EXTENDED_RELEASE_TABLET | Freq: Every day | ORAL | 2 refills | Status: DC
Start: 2016-04-14 — End: 2016-10-02

## 2016-04-14 MED FILL — AMLODIPINE BESYLATE 5 MG TA: 5 | 90 days supply | Qty: 180 | Fill #0

## 2016-04-14 MED FILL — KLOR-CON M10 TABLET: 10 | 30 days supply | Qty: 30 | Fill #0

## 2016-04-27 MED FILL — OLMESARTAN-HCTZ 40-25 MG TA: 40-25 | 30 days supply | Qty: 30 | Fill #1

## 2016-04-27 MED FILL — CITALOPRAM HBR 20 MG TABLET: 20 | 30 days supply | Qty: 30 | Fill #2

## 2016-04-28 ENCOUNTER — Other Ambulatory Visit: Payer: 59 | Admitting: *Deleted

## 2016-04-28 DIAGNOSIS — I10 Essential (primary) hypertension: Secondary | ICD-10-CM

## 2016-04-29 ENCOUNTER — Ambulatory Visit: Payer: 59 | Attending: Orthopedic Surgery | Admitting: Physical Therapy

## 2016-04-29 ENCOUNTER — Encounter: Payer: Self-pay | Admitting: Physical Therapy

## 2016-04-29 DIAGNOSIS — M25551 Pain in right hip: Secondary | ICD-10-CM | POA: Insufficient documentation

## 2016-04-29 DIAGNOSIS — R262 Difficulty in walking, not elsewhere classified: Secondary | ICD-10-CM | POA: Insufficient documentation

## 2016-04-29 DIAGNOSIS — S73101A Unspecified sprain of right hip, initial encounter: Secondary | ICD-10-CM | POA: Diagnosis not present

## 2016-04-29 LAB — BASIC METABOLIC PANEL
BUN/Creatinine Ratio: 15 (ref 9–23)
BUN: 14 mg/dL (ref 6–24)
CALCIUM: 9.6 mg/dL (ref 8.7–10.2)
CHLORIDE: 101 mmol/L (ref 96–106)
CO2: 26 mmol/L (ref 18–29)
Creatinine, Ser: 0.91 mg/dL (ref 0.57–1.00)
GFR calc Af Amer: 85 mL/min/{1.73_m2} (ref >59)
GFR, EST NON AFRICAN AMERICAN: 74 mL/min/{1.73_m2} (ref >59)
Glucose: 69 mg/dL (ref 65–99)
POTASSIUM: 3.9 mmol/L (ref 3.5–5.2)
Sodium: 142 mmol/L (ref 134–144)

## 2016-04-29 MED FILL — MELOXICAM 15 MG TABLET: 15 | 30 days supply | Qty: 30 | Fill #0

## 2016-04-29 NOTE — Patient Instructions (Signed)
  Stand equally on your feet R[place] 2x/day 5 min
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
## 2016-05-04 ENCOUNTER — Encounter: Payer: Self-pay | Admitting: Physical Therapy

## 2016-05-04 ENCOUNTER — Ambulatory Visit: Payer: 59 | Admitting: Physical Therapy

## 2016-05-04 DIAGNOSIS — R262 Difficulty in walking, not elsewhere classified: Secondary | ICD-10-CM

## 2016-05-04 DIAGNOSIS — M25551 Pain in right hip: Secondary | ICD-10-CM | POA: Diagnosis not present

## 2016-05-04 MED FILL — OMEPRAZOLE DR 40 MG CAPSULE: 40 | 30 days supply | Qty: 60 | Fill #1

## 2016-05-04 NOTE — Therapy (Signed)
[place], [place], [address] Phone: [phone]   Fax:  [phone]  Physical Therapy Treatment  Patient Details  Name: Erin [name] MRN: [id number] Date of Birth: [birth year] Referring Provider: Morley Kos, OPA-C (MD Almedia Balls)  Encounter Date: 05/04/2016      PT End of Session - 05/04/16 1323    Visit Number 2   Number of Visits 9   Date for PT Re-Evaluation 05/29/16   PT Start Time 4259   PT Stop Time 1115   PT Time Calculation (min) 57 min   Activity Tolerance Patient tolerated treatment well   Behavior During Therapy [facility] for tasks assessed/performed      Past Medical History:  Diagnosis Date  . Anxiety   . Depression   . GERD (gastroesophageal reflux disease)   . Menorrhagia   . OSA on CPAP   . Type 2 diabetes mellitus ([place])   . Wears glasses     Past Surgical History:  Procedure Laterality Date  . DILITATION & CURRETTAGE/HYSTROSCOPY WITH NOVASURE ABLATION N/A 08/26/2015   Procedure: DILATATION & CURETTAGE/HYSTEROSCOPY WITH NOVASURE ABLATION;  Surgeon: [name], MD;  Location: [place];  Service: Gynecology;  Laterality: N/A;  . EXCISIONAL LEFT BREAST BX  11-14-2007   benign  . LAPAROSCOPIC TUBAL LIGATION Bilateral 08/26/2015   Procedure: LAPAROSCOPIC TUBAL LIGATION;  Surgeon: [name], MD;  Location: [facility];  Service: Gynecology;  Laterality: Bilateral;  . THYROID LOBECTOMY Right 04-30-2008    There were no vitals filed for this visit.      Subjective Assessment - 05/04/16 1020    Subjective Friday night spasm left shin  better with flexoril.  Having a little pain this morning. I have been doing the exercises and softissue work.     Currently in Pain? Yes   Pain Score --  uo tp 10/10   Pain Location Hip   Pain Orientation Right   Pain Descriptors / Indicators Spasm;Sharp;Aching   Pain Radiating Towards groin    Aggravating Factors  longer standing,  longer walking   Pain Relieving Factors flexoril                         [facility] Adult PT Treatment/Exercise - 05/04/16 0001      Lumbar Exercises: Stretches   Piriformis Stretch Limitations figure 4 pull     Lumbar Exercises: Supine   Bridge 10 reps   Bridge Limitations Able to do with equal weightbearing   Other Supine Lumbar Exercises iso ball squeeze hooklying  10 x 2 sets.  Feels good , some   Other Supine Lumbar Exercises Muscle energy :  Left into bed, right hand pressing right thigh (Hip at 90)   centralized pain.  painful     Lumbar Exercises: Sidelying   Other Sidelying Lumbar Exercises quadratus lumborum stretch  over pillow and arm reach overhead. Demonstrated how standin     Knee/Hip Exercises: Supine   Straight Leg Raises 1 set;10 reps;AAROM  right only     Moist Heat Therapy   Number Minutes Moist Heat 15 Minutes   Moist Heat Location Hip     Manual Therapy   Manual therapy comments contract relax right quads 5 X 10 seconds holds   Soft tissue mobilization Instrument assist Piriformis, ITBand   did not tolerate hip Quadratus lumborum PROM  illiac crest  PT Short Term Goals - 04/29/16 1240      PT SHORT TERM GOAL #1   Title Pt will demo 5/5 MMT in all hip motions to indicate proper support to pelvic region by surrounding musculature   Baseline did not test at eval due to notable pelvic rotation   Time 4   Period Weeks   Status New     PT SHORT TERM GOAL #2   Title GOTO to 81% ability to indicate significant improvement in functional ability   Baseline 67% ability at eval   Time 4   Period Weeks   Status New     PT SHORT TERM GOAL #3   Title Pt will demo proper squat/lift and turning motion similar to work activities with abdominal engagement to reduce risk of rotation   Baseline improper posture at eval   Time 4   Period Weeks   Status New     PT SHORT TERM GOAL #4    Title Pt will be able to get into/out of car without hip pain   Baseline pain at eval   Time 4   Period Weeks   Status New     PT SHORT TERM GOAL #5   Title pt will be able to return to dancing with knowledge of proper abdominal control and stretching   Baseline will educate as treatment progresses   Time 4   Period Weeks   Status New                  Plan - 05/04/16 1323    Clinical Impression Statement Rotation of pelvis continues.  She was able to improve alignment until QLumborum went into spasm.  Manual helpful pain 4/10 post session.   PT Next Visit Plan check pelvic rotation, MET PRN, Instruct in self correction, IASTM TFL & ITB   PT Home Exercise Plan supine ball squeeze, piriformis stretch, pelvic tilt/abdominal engagement all positions, home roller ITB & piriformis   Consulted and Agree with Plan of Care Patient      Patient will benefit from skilled therapeutic intervention in order to improve the following deficits and impairments:  Difficulty walking, Increased muscle spasms, Decreased activity tolerance, Pain, Decreased mobility, Postural dysfunction, Improper body mechanics, Decreased strength  Visit Diagnosis: Pain in right hip  Difficulty in walking, not elsewhere classified     Problem List Patient Active Problem List   Diagnosis Date Noted  . HTN (hypertension) 04/13/2016  . Menorrhagia 08/26/2015    Class: Present on Admission    HARRIS,KAREN PTA 05/04/2016, 1:26 PM  [facility] [address] [place], [place], [address] Phone: [phone]   Fax:  [phone]  Name: Erin [name] MRN: [id number] Date of Birth: [birth year]

## 2016-05-06 ENCOUNTER — Ambulatory Visit: Payer: 59 | Admitting: Physical Therapy

## 2016-05-06 ENCOUNTER — Encounter: Payer: Self-pay | Admitting: Physical Therapy

## 2016-05-06 DIAGNOSIS — R262 Difficulty in walking, not elsewhere classified: Secondary | ICD-10-CM | POA: Diagnosis not present

## 2016-05-06 DIAGNOSIS — M25551 Pain in right hip: Secondary | ICD-10-CM

## 2016-05-06 NOTE — Therapy (Signed)
Erin [name], [place], [address] Phone: [phone]   Fax:  [phone]  Physical Therapy Treatment  Patient Details  Name: Erin [name] MRN: [id number] Date of Birth: October 09, 1965 Referring Provider: Morley [name], OPA-C (MD Erin [name])  Encounter Date: 05/06/2016      PT End of Session - 05/06/16 1332    Visit Number 3   Number of Visits 9   Date for PT Re-Evaluation 05/29/16   Authorization Type [facility]   PT Start Time 1332   PT Stop Time 1412   PT Time Calculation (min) 40 min   Activity Tolerance Patient tolerated treatment well   Behavior During Therapy Erin [name] for tasks assessed/performed      Past Medical History:  Diagnosis Date  . Anxiety   . Depression   . GERD (gastroesophageal reflux disease)   . Menorrhagia   . OSA on CPAP   . Type 2 diabetes mellitus ([place])   . Wears glasses     Past Surgical History:  Procedure Laterality Date  . DILITATION & CURRETTAGE/HYSTROSCOPY WITH NOVASURE ABLATION N/A 08/26/2015   Procedure: DILATATION & CURETTAGE/HYSTEROSCOPY WITH NOVASURE ABLATION;  Surgeon: Erin Nigh, MD;  Location: [facility];  Service: Gynecology;  Laterality: N/A;  . EXCISIONAL LEFT BREAST BX  11-14-2007   benign  . LAPAROSCOPIC TUBAL LIGATION Bilateral 08/26/2015   Procedure: LAPAROSCOPIC TUBAL LIGATION;  Surgeon: Erin Nigh, MD;  Location: [facility];  Service: Gynecology;  Laterality: Bilateral;  . THYROID LOBECTOMY Right 04-30-2008    There were no vitals filed for this visit.      Subjective Assessment - 05/06/16 1332    Subjective Reports aching yesterday, could feel R hip every time she moved. Today feels a little more sore.    Patient Stated Goals getting into/out of car, dance   Currently in Pain? Yes   Pain Score 3    Pain Location Hip   Pain Orientation Right;Lateral   Pain Descriptors / Indicators Sore                          [facility] Adult PT Treatment/Exercise - 05/06/16 0001      Lumbar Exercises: Stretches   Prone Mid Back Stretch Limitations child pose     Knee/Hip Exercises: Stretches   Passive Hamstring Stretch Limitations supine with green strap, to midline for ITB   Other Knee/Hip Stretches figure 4 piriformis stretch     Knee/Hip Exercises: Aerobic   Stationary Bike 5 min L3     Knee/Hip Exercises: Supine   Bridges with Ball Squeeze 15 reps   Straight Leg Raise with External Rotation Both;15 reps     Knee/Hip Exercises: Prone   Hip Extension Limitations prone glut set with bilat hip ext     Manual Therapy   Soft tissue mobilization IASTM ITB, piriformis, VL                PT Education - 05/06/16 1337    Education provided Yes   Education Details exercise form/rationale, HEP   Person(s) Educated Patient   Methods Explanation;Demonstration;Tactile cues;Verbal cues   Comprehension Verbalized understanding;Returned demonstration;Verbal cues required;Tactile cues required;Need further instruction          PT Short Term Goals - 04/29/16 1240      PT SHORT TERM GOAL #1   Title Pt will demo 5/5 MMT in all hip motions to indicate proper support to pelvic  region by surrounding musculature   Baseline did not test at eval due to notable pelvic rotation   Time 4   Period Weeks   Status New     PT SHORT TERM GOAL #2   Title GOTO to 81% ability to indicate significant improvement in functional ability   Baseline 67% ability at eval   Time 4   Period Weeks   Status New     PT SHORT TERM GOAL #3   Title Pt will demo proper squat/lift and turning motion similar to work activities with abdominal engagement to reduce risk of rotation   Baseline improper posture at eval   Time 4   Period Weeks   Status New     PT SHORT TERM GOAL #4   Title Pt will be able to get into/out of car without hip pain   Baseline pain at eval   Time 4   Period Weeks    Status New     PT SHORT TERM GOAL #5   Title pt will be able to return to dancing with knowledge of proper abdominal control and stretching   Baseline will educate as treatment progresses   Time 4   Period Weeks   Status New                  Plan - 05/06/16 1412    Clinical Impression Statement Significant weakness in bilat illiopsoas noted. concordant pain found in trigger points in piriformis and with palpation to ITB. Pt reported decrease in concordant pain following treatment today.    PT Next Visit Plan check pelvic rotation, MET PRN, Instruct in self correction, IASTM TFL & ITB   PT Home Exercise Plan supine ball squeeze, piriformis stretch, pelvic tilt/abdominal engagement all positions, home roller ITB & piriformis, bridge ball squeeze, SLR in ER, child pose   Consulted and Agree with Plan of Care Patient      Patient will benefit from skilled therapeutic intervention in order to improve the following deficits and impairments:     Visit Diagnosis: Pain in right hip  Difficulty in walking, not elsewhere classified     Problem List Patient Active Problem List   Diagnosis Date Noted  . HTN (hypertension) 04/13/2016  . Menorrhagia 08/26/2015    Class: Present on Admission    Erin [name] PT, DPT 05/06/16 2:14 PM   [facility] [address] [place], [place], [address] Phone: [phone]   Fax:  [phone]  Name: Erin [name] MRN: [id number] Date of Birth: [birth year]

## 2016-05-07 ENCOUNTER — Ambulatory Visit (INDEPENDENT_AMBULATORY_CARE_PROVIDER_SITE_OTHER): Payer: 59 | Admitting: Pharmacist

## 2016-05-07 VITALS — BP 128/72 | HR 78

## 2016-05-07 DIAGNOSIS — I10 Essential (primary) hypertension: Secondary | ICD-10-CM

## 2016-05-07 DIAGNOSIS — E119 Type 2 diabetes mellitus without complications: Secondary | ICD-10-CM | POA: Diagnosis not present

## 2016-05-07 NOTE — Progress Notes (Signed)
Patient ID: Erin [name]                 DOB: 11-02-1965                      MRN: [id number]     HPI: Erin [name] is a 51 y.o. female patient of Dr. Harrington Challenger with [facility] who presents today for hypertension follow up. At her appointment with Dr. Harrington Challenger 1 month ago, her pressure was 140/70, HR 73. She had not been taking her olmesartan/HCTZ, but was taking amlodipine 5 mg. Dr. Harrington Challenger increased amlodipine to 10 mg daily. 2 weeks ago, patient called and reported significant LEE, so amlodipine was decreased to 5 mg daily and olmesartan/HCTZ was restarted.   She presents today and states that she has been doing well, but has been in a lot of pain since last visit and has not checked her pressures.   Cardiac Hx: HTN  Current HTN meds:  Amlodipine 5 mg daily Olmesartan/HCTZ 40/25 mg daily   Previously tried: Benazepril - cough  BP goal: <130/80  Family History: Mother- stroke, diabetes  Social History: Patient reports that she quit smoking after 15 years in 2000. She does not drink alcohol.   Diet: Patient notes that she eats fast foods mostly (McDonald's, Wendy's) and eats sandwiches. She drinks sweet tea when she eats out, so typically 2 cups a week.   Exercise: Does not partake in formal exercise, but walks a lot at work. She does note that she has lost 100+ lbs after starting metformin.   Home BP readings: She does use a wrist BP cuff, but doesn't record the numbers.   Wt Readings from Last 3 Encounters:  03/16/16 225 lb (102.1 kg)  08/26/15 234 lb (106.1 kg)   BP Readings from Last 3 Encounters:  05/07/16 128/72  04/13/16 (!) 150/90  03/16/16 140/70   Pulse Readings from Last 3 Encounters:  05/07/16 78  04/13/16 66  03/16/16 73    Renal function: CrCl cannot be calculated (Unknown ideal weight.).  Past Medical History:  Diagnosis Date  . Anxiety   . Depression   . GERD (gastroesophageal reflux disease)   . Menorrhagia   . OSA on CPAP   . Type 2 diabetes  mellitus ([place])   . Wears glasses     Current Outpatient Prescriptions on File Prior to Visit  Medication Sig Dispense Refill  . amLODipine (NORVASC) 5 MG tablet Take 1 tablet (5 mg total) by mouth daily. 180 tablet 3  . citalopram (CELEXA) 20 MG tablet Take 20 mg by mouth every morning.    . metFORMIN (GLUCOPHAGE) 500 MG tablet Take by mouth daily with breakfast.    . olmesartan-hydrochlorothiazide (BENICAR HCT) 40-25 MG tablet Take 1 tablet by mouth daily. 30 tablet 11  . omeprazole (PRILOSEC) 40 MG capsule Take 40 mg by mouth every morning.    . potassium chloride (K-DUR) 10 MEQ tablet Take 1 tablet (10 mEq total) by mouth daily. 30 tablet 2   No current facility-administered medications on file prior to visit.     No Known Allergies  Blood pressure 128/72, pulse 78.   Assessment/Plan: Hypertension:  BP is at goal today. Continue current therapy (amlodipine '5mg'$  daily, olmesartan/HCTZ 40/'25mg'$  daily). Follow up with Dr. Harrington Challenger as scheduled in May.   Will route to Dr. Harrington Challenger for recommendation on PCP.   Thank you, [name]. Patterson Hammersmith, [facility]  05/07/2016 4:44  PM   

## 2016-05-07 NOTE — Patient Instructions (Addendum)
Return for a follow up appointment as scheduled with Dr. Harrington Challenger  Check your blood pressure at home daily (if able) and keep record of the readings.  Take your BP meds as follows: Continue amlodipine '5mg'$  daily and omlesartan/HCTZ 40/'25mg'$  daily   Bring all of your meds, your BP cuff and your record of home blood pressures to your next appointment.  Exercise as you're able, try to walk approximately 30 minutes per day.  Keep salt intake to a minimum, especially watch canned and prepared boxed foods.  Eat more fresh fruits and vegetables and fewer canned items.  Avoid eating in fast food restaurants.    HOW TO TAKE YOUR BLOOD PRESSURE: . Rest 5 minutes before taking your blood pressure. .  Don't smoke or drink caffeinated beverages for at least 30 minutes before. . Take your blood pressure before (not after) you eat. . Sit comfortably with your back supported and both feet on the floor (don't cross your legs). . Elevate your arm to heart level on a table or a desk. . Use the proper sized cuff. It should fit smoothly and snugly around your bare upper arm. There should be enough room to slip a fingertip under the cuff. The bottom edge of the cuff should be 1 inch above the crease of the elbow. . Ideally, take 3 measurements at one sitting and record the average.

## 2016-05-10 NOTE — Progress Notes (Signed)
Pt can look into appt at [facility] [place] there  [place] at [place] at [facility]

## 2016-05-11 ENCOUNTER — Ambulatory Visit: Payer: 59 | Attending: Orthopedic Surgery | Admitting: Physical Therapy

## 2016-05-11 ENCOUNTER — Encounter: Payer: Self-pay | Admitting: Physical Therapy

## 2016-05-11 DIAGNOSIS — M25551 Pain in right hip: Secondary | ICD-10-CM | POA: Insufficient documentation

## 2016-05-11 DIAGNOSIS — R262 Difficulty in walking, not elsewhere classified: Secondary | ICD-10-CM | POA: Insufficient documentation

## 2016-05-11 NOTE — Therapy (Signed)
[place], [place], [address] Phone: [phone]   Fax:  [phone]  Physical Therapy Treatment  Patient Details  Name: Erin [name] MRN: [id number] Date of Birth: 1965-05-09 Referring Provider: Morley Kos, OPA-C (MD [facility] Balls)  Encounter Date: 05/11/2016      PT End of Session - 05/11/16 1547    Visit Number 4   Number of Visits 9   Date for PT Re-Evaluation 05/29/16   Authorization Type [facility]   PT Start Time 1548   PT Stop Time 1626   PT Time Calculation (min) 38 min   Activity Tolerance Patient tolerated treatment well   Behavior During Therapy [facility] for tasks assessed/performed      Past Medical History:  Diagnosis Date  . Anxiety   . Depression   . GERD (gastroesophageal reflux disease)   . Menorrhagia   . OSA on CPAP   . Type 2 diabetes mellitus ([place])   . Wears glasses     Past Surgical History:  Procedure Laterality Date  . DILITATION & CURRETTAGE/HYSTROSCOPY WITH NOVASURE ABLATION N/A 08/26/2015   Procedure: DILATATION & CURETTAGE/HYSTEROSCOPY WITH NOVASURE ABLATION;  Surgeon: [name], MD;  Location: [facility];  Service: Gynecology;  Laterality: N/A;  . EXCISIONAL LEFT BREAST BX  11-14-2007   benign  . LAPAROSCOPIC TUBAL LIGATION Bilateral 08/26/2015   Procedure: LAPAROSCOPIC TUBAL LIGATION;  Surgeon: [name], MD;  Location: [facility];  Service: Gynecology;  Laterality: Bilateral;  . THYROID LOBECTOMY Right 04-30-2008    There were no vitals filed for this visit.      Subjective Assessment - 05/11/16 1548    Subjective pt reports feeling really good. Took meloxicam last Wed and woke without any pain. About 30 min-1 hour before she came here began feeling pain. Has not done any stretches since last Friday.    Patient Stated Goals getting into/out of car, dance   Currently in Pain? Yes   Pain Location Leg   Pain Orientation  Right;Upper;Lateral                         [facility] Adult PT Treatment/Exercise - 05/11/16 0001      Lumbar Exercises: Supine   AB Set Limitations supine pelvic tilt, ball bw knees   Other Supine Lumbar Exercises ball squeeze bw knees, knees to chest     Knee/Hip Exercises: Stretches   Sports administrator Limitations edge of bed     Knee/Hip Exercises: Aerobic   Elliptical 5 min L1 ramp 10     Knee/Hip Exercises: Supine   Bridges with Ball Squeeze 15 reps     Knee/Hip Exercises: Prone   Hamstring Curl 15 reps  bilat   Hamstring Curl Limitations iso hamstring curl with hip extension     Manual Therapy   Soft tissue mobilization IASTM R VL, illiopsoas                PT Education - 05/11/16 1627    Education provided Yes   Education Details exercise form/rationale, importance of abdominal engagement in exercise and functional activities, HEP   Person(s) Educated Patient   Methods Explanation;Demonstration;Tactile cues;Verbal cues   Comprehension Verbalized understanding;Returned demonstration;Verbal cues required;Tactile cues required;Need further instruction          PT Short Term Goals - 04/29/16 1240      PT SHORT TERM GOAL #1   Title Pt will demo 5/5 MMT  in all hip motions to indicate proper support to pelvic region by surrounding musculature   Baseline did not test at eval due to notable pelvic rotation   Time 4   Period Weeks   Status New     PT SHORT TERM GOAL #2   Title GOTO to 81% ability to indicate significant improvement in functional ability   Baseline 67% ability at eval   Time 4   Period Weeks   Status New     PT SHORT TERM GOAL #3   Title Pt will demo proper squat/lift and turning motion similar to work activities with abdominal engagement to reduce risk of rotation   Baseline improper posture at eval   Time 4   Period Weeks   Status New     PT SHORT TERM GOAL #4   Title Pt will be able to get into/out of car without hip pain    Baseline pain at eval   Time 4   Period Weeks   Status New     PT SHORT TERM GOAL #5   Title pt will be able to return to dancing with knowledge of proper abdominal control and stretching   Baseline will educate as treatment progresses   Time 4   Period Weeks   Status New                  Plan - 05/11/16 1614    Clinical Impression Statement Concordant pain noted in trigger points and tightness of quads and hip flexors, neutral pelvic rotation. significant difficulty activating glut max for hip extension on R indicating cont poor contraction around lumbo pelvic region.    PT Next Visit Plan check pelvic rotation, MET PRN, Instruct in self correction, IASTM TFL & ITB; reformer   PT Home Exercise Plan supine ball squeeze, piriformis stretch, pelvic tilt/abdominal engagement all positions, home roller ITB & piriformis, bridge ball squeeze, SLR in ER, child pose   Consulted and Agree with Plan of Care Patient      Patient will benefit from skilled therapeutic intervention in order to improve the following deficits and impairments:     Visit Diagnosis: Pain in right hip  Difficulty in walking, not elsewhere classified     Problem List Patient Active Problem List   Diagnosis Date Noted  . HTN (hypertension) 04/13/2016  . Menorrhagia 08/26/2015    Class: Present on Admission  Jayesh Marbach C. Melburn Treiber PT, DPT 05/11/16 4:28 PM   [facility] [address] [place], [place], [address] Phone: [phone]   Fax:  [phone]  Name: Erin [name] MRN: [id number] Date of Birth: [birth year]

## 2016-05-13 ENCOUNTER — Encounter: Payer: Self-pay | Admitting: Physical Therapy

## 2016-05-13 ENCOUNTER — Ambulatory Visit: Payer: 59 | Admitting: Physical Therapy

## 2016-05-13 DIAGNOSIS — M25551 Pain in right hip: Secondary | ICD-10-CM

## 2016-05-13 DIAGNOSIS — S73101D Unspecified sprain of right hip, subsequent encounter: Secondary | ICD-10-CM | POA: Diagnosis not present

## 2016-05-13 DIAGNOSIS — R262 Difficulty in walking, not elsewhere classified: Secondary | ICD-10-CM

## 2016-05-13 NOTE — Therapy (Signed)
[place], [place], [address] Phone: [phone]   Fax:  [phone]  Physical Therapy Treatment  Patient Details  Name: Erin [name] MRN: [id number] Date of Birth: 1966/02/01 Referring Provider: Morley Kos, OPA-C (MD Al[facility] Balls)  Encounter Date: 05/13/2016      PT End of Session - 05/13/16 1549    Visit Number 5   Number of Visits 9   Date for PT Re-Evaluation 05/29/16   PT Start Time 6010   PT Stop Time 1626   PT Time Calculation (min) 39 min   Activity Tolerance Patient tolerated treatment well   Behavior During Therapy [facility] for tasks assessed/performed      Past Medical History:  Diagnosis Date  . Anxiety   . Depression   . GERD (gastroesophageal reflux disease)   . Menorrhagia   . OSA on CPAP   . Type 2 diabetes mellitus ([place])   . Wears glasses     Past Surgical History:  Procedure Laterality Date  . DILITATION & CURRETTAGE/HYSTROSCOPY WITH NOVASURE ABLATION N/A 08/26/2015   Procedure: DILATATION & CURETTAGE/HYSTEROSCOPY WITH NOVASURE ABLATION;  Surgeon: [name], MD;  Location: [facility];  Service: Gynecology;  Laterality: N/A;  . EXCISIONAL LEFT BREAST BX  11-14-2007   benign  . LAPAROSCOPIC TUBAL LIGATION Bilateral 08/26/2015   Procedure: LAPAROSCOPIC TUBAL LIGATION;  Surgeon: [name], MD;  Location: [facility];  Service: Gynecology;  Laterality: Bilateral;  . THYROID LOBECTOMY Right 04-30-2008    There were no vitals filed for this visit.                       [facility] Adult PT Treatment/Exercise - 05/13/16 0001      Lumbar Exercises: Stretches   Double Knee to Chest Stretch Limitations 1 min hold   Prone Mid Back Stretch Limitations child pose     Lumbar Exercises: Supine   Other Supine Lumbar Exercises series on foam roll: pelvic tilt, marching with hands, hooklying without hands, alt tippie toes without hands,  marching without hands     Knee/Hip Exercises: Aerobic   Nustep L5 5 min     Knee/Hip Exercises: Seated   Sit to Sand 10 reps;without UE support  ball bw knees     Knee/Hip Exercises: Prone   Hamstring Curl 15 reps   Hamstring Curl Limitations iso hamstring curl bilat, feet touching     Manual Therapy   Soft tissue mobilization IASTM bilateral piriformis, R QL and lumbar paraspinals                PT Education - 05/13/16 1549    Education provided Yes   Education Details exercise form/rationale   Person(s) Educated Patient   Methods Explanation;Demonstration;Tactile cues;Verbal cues   Comprehension Verbalized understanding;Returned demonstration;Verbal cues required;Tactile cues required;Need further instruction          PT Short Term Goals - 04/29/16 1240      PT SHORT TERM GOAL #1   Title Pt will demo 5/5 MMT in all hip motions to indicate proper support to pelvic region by surrounding musculature   Baseline did not test at eval due to notable pelvic rotation   Time 4   Period Weeks   Status New     PT SHORT TERM GOAL #2   Title GOTO to 81% ability to indicate significant improvement in functional ability   Baseline 67% ability at eval   Time 4  Period Weeks   Status New     PT SHORT TERM GOAL #3   Title Pt will demo proper squat/lift and turning motion similar to work activities with abdominal engagement to reduce risk of rotation   Baseline improper posture at eval   Time 4   Period Weeks   Status New     PT SHORT TERM GOAL #4   Title Pt will be able to get into/out of car without hip pain   Baseline pain at eval   Time 4   Period Weeks   Status New     PT SHORT TERM GOAL #5   Title pt will be able to return to dancing with knowledge of proper abdominal control and stretching   Baseline will educate as treatment progresses   Time 4   Period Weeks   Status New                  Plan - 05/13/16 1627    Clinical Impression  Statement Pt denies any pain with daily activities but cont to feel tightness and difficulty relaxing upon laying supine. Challenged abodminal wall to stabilize pelvis today in rotational movements which pt was able to perform with difficulty. Challenged pt to utilize R/L contractions in sit to stand with ball bw knees which was difficult.    PT Next Visit Plan check pelvic rotation prn, reformer   PT Home Exercise Plan supine ball squeeze, piriformis stretch, pelvic tilt/abdominal engagement all positions, home roller ITB & piriformis, bridge ball squeeze, SLR in ER, child pose   Consulted and Agree with Plan of Care Patient      Patient will benefit from skilled therapeutic intervention in order to improve the following deficits and impairments:     Visit Diagnosis: Pain in right hip  Difficulty in walking, not elsewhere classified     Problem List Patient Active Problem List   Diagnosis Date Noted  . HTN (hypertension) 04/13/2016  . Menorrhagia 08/26/2015    Class: Present on Admission   Devlin Mcveigh C. Salli Bodin PT, DPT 05/13/16 4:29 PM   [facility] [address] [place], [place], [address] Phone: [phone]   Fax:  [phone]  Name: Erin [name] MRN: [id number] Date of Birth: 10-Aug-1965

## 2016-05-15 NOTE — Progress Notes (Signed)
Called patient and provided phone numbers for [facility] on [facility] and [facility] at [facility].

## 2016-05-18 ENCOUNTER — Encounter: Payer: Self-pay | Admitting: Physical Therapy

## 2016-05-18 ENCOUNTER — Ambulatory Visit: Payer: 59 | Admitting: Physical Therapy

## 2016-05-18 DIAGNOSIS — R262 Difficulty in walking, not elsewhere classified: Secondary | ICD-10-CM

## 2016-05-18 DIAGNOSIS — M25551 Pain in right hip: Secondary | ICD-10-CM

## 2016-05-18 NOTE — Therapy (Signed)
[place], [place], [address] Phone: [phone]   Fax:  [phone]  Physical Therapy Treatment  Patient Details  Name: Erin [name] MRN: [id number] Date of Birth: 07-21-1965 Referring Provider: Morley Kos, OPA-C (MD Almedia Balls)  Encounter Date: 05/18/2016      PT End of Session - 05/18/16 1708    Visit Number 6   Number of Visits 9   Date for PT Re-Evaluation 05/29/16   PT Start Time 3532   PT Stop Time 9924   PT Time Calculation (min) 60 min   Behavior During Therapy [facility] for tasks assessed/performed      Past Medical History:  Diagnosis Date  . Anxiety   . Depression   . GERD (gastroesophageal reflux disease)   . Menorrhagia   . OSA on CPAP   . Type 2 diabetes mellitus ([place])   . Wears glasses     Past Surgical History:  Procedure Laterality Date  . DILITATION & CURRETTAGE/HYSTROSCOPY WITH NOVASURE ABLATION N/A 08/26/2015   Procedure: DILATATION & CURETTAGE/HYSTEROSCOPY WITH NOVASURE ABLATION;  Surgeon: [name], MD;  Location: [facility];  Service: Gynecology;  Laterality: N/A;  . EXCISIONAL LEFT BREAST BX  11-14-2007   benign  . LAPAROSCOPIC TUBAL LIGATION Bilateral 08/26/2015   Procedure: LAPAROSCOPIC TUBAL LIGATION;  Surgeon: [name], MD;  Location[facility];  Service: Gynecology;  Laterality: Bilateral;  . THYROID LOBECTOMY Right 04-30-2008    There were no vitals filed for this visit.      Subjective Assessment - 05/18/16 1556    Subjective Sat. had major cramp anterior thigh right.   Tried everything but nothing helped.  She twisted leg across other leg in long sitting .   Currently in Pain? Yes   Pain Score 0-No pain  up to 10/10 over the weekend   Pain Location Leg  amd right low back   Pain Orientation Right;Upper  quads   Pain Descriptors / Indicators Cramping   Aggravating Factors  twisting leg over other in long  sitting in bed started a cramp in quad   Pain Relieving Factors Flexoril                         [facility] Adult PT Treatment/Exercise - 05/18/16 0001      Lumbar Exercises: Stretches   Double Knee to Chest Stretch Limitations 1 min  assist   Prone Mid Back Stretch Limitations 2 X child's pose     Lumbar Exercises: Supine   Other Supine Lumbar Exercises series for foam roller using rolled sheet instead. march alternate 10 5 X 2 sets,,  toe taps 5 X 2 sets     Moist Heat Therapy   Number Minutes Moist Heat 15 Minutes   Moist Heat Location Hip     Manual Therapy   Manual Therapy Soft tissue mobilization   Soft tissue mobilization instrument assist,  and manual work                   PT Short Term Goals - 04/29/16 1240      PT SHORT TERM GOAL #1   Title Pt will demo 5/5 MMT in all hip motions to indicate proper support to pelvic region by surrounding musculature   Baseline did not test at eval due to notable pelvic rotation   Time 4   Period Weeks   Status New     PT  SHORT TERM GOAL #2   Title GOTO to 81% ability to indicate significant improvement in functional ability   Baseline 67% ability at eval   Time 4   Period Weeks   Status New     PT SHORT TERM GOAL #3   Title Pt will demo proper squat/lift and turning motion similar to work activities with abdominal engagement to reduce risk of rotation   Baseline improper posture at eval   Time 4   Period Weeks   Status New     PT SHORT TERM GOAL #4   Title Pt will be able to get into/out of car without hip pain   Baseline pain at eval   Time 4   Period Weeks   Status New     PT SHORT TERM GOAL #5   Title pt will be able to return to dancing with knowledge of proper abdominal control and stretching   Baseline will educate as treatment progresses   Time 4   Period Weeks   Status New                  Plan - 05/18/16 1708    Clinical Impression Statement Focus on stabilization and  manual for pain.  Patient had a pain flare over the weekend that may be due to increased activity the day before.   Both hips were sore with manual.  Patient is independent with her HEP issued so far.   PT Next Visit Plan check pelvic rotation prn, reformer   PT Home Exercise Plan supine ball squeeze, piriformis stretch, pelvic tilt/abdominal engagement all positions, home roller ITB & piriformis, bridge ball squeeze, SLR in ER, child pose   Consulted and Agree with Plan of Care Patient      Patient will benefit from skilled therapeutic intervention in order to improve the following deficits and impairments:  Difficulty walking, Increased muscle spasms, Decreased activity tolerance, Pain, Decreased mobility, Postural dysfunction, Improper body mechanics, Decreased strength  Visit Diagnosis: Pain in right hip  Difficulty in walking, not elsewhere classified     Problem List Patient Active Problem List   Diagnosis Date Noted  . HTN (hypertension) 04/13/2016  . Menorrhagia 08/26/2015    Class: Present on Admission    Erin [name] PTA 05/18/2016, 5:15 PM  [facility] [address] [place], [place], [address] Phone: [phone]   Fax:  [phone]  Name: Erin [name] MRN: [id number] Date of Birth: May 31, 1965

## 2016-05-20 ENCOUNTER — Ambulatory Visit: Payer: 59 | Admitting: Physical Therapy

## 2016-05-20 ENCOUNTER — Encounter: Payer: Self-pay | Admitting: Physical Therapy

## 2016-05-20 DIAGNOSIS — M25551 Pain in right hip: Secondary | ICD-10-CM | POA: Diagnosis not present

## 2016-05-20 DIAGNOSIS — R262 Difficulty in walking, not elsewhere classified: Secondary | ICD-10-CM | POA: Diagnosis not present

## 2016-05-20 NOTE — Therapy (Addendum)
[place], [place], [address] Phone: [phone]   Fax:  [phone]  Physical Therapy Treatment/Discharge summary  Patient Details  Name: Erin [name] MRN: [id number] Date of Birth: [birth year] Referring Provider: Morley Kos, OPA-C (MD Almedia Balls)  Encounter Date: 05/20/2016      PT End of Session - 05/20/16 1549    Visit Number 7   Number of Visits 9   Authorization Type [facility]   PT Start Time 5498   PT Stop Time 1629   PT Time Calculation (min) 40 min   Activity Tolerance Patient tolerated treatment well   Behavior During Therapy [facility] for tasks assessed/performed      Past Medical History:  Diagnosis Date  . Anxiety   . Depression   . GERD (gastroesophageal reflux disease)   . Menorrhagia   . OSA on CPAP   . Type 2 diabetes mellitus ([place])   . Wears glasses     Past Surgical History:  Procedure Laterality Date  . DILITATION & CURRETTAGE/HYSTROSCOPY WITH NOVASURE ABLATION N/A 08/26/2015   Procedure: DILATATION & CURETTAGE/HYSTEROSCOPY WITH NOVASURE ABLATION;  Surgeon: [name], MD;  Location: [facility];  Service: Gynecology;  Laterality: N/A;  . EXCISIONAL LEFT BREAST BX  11-14-2007   benign  . LAPAROSCOPIC TUBAL LIGATION Bilateral 08/26/2015   Procedure: LAPAROSCOPIC TUBAL LIGATION;  Surgeon: [name], MD;  Location: [facility];  Service: Gynecology;  Laterality: Bilateral;  . THYROID LOBECTOMY Right 04-30-2008    There were no vitals filed for this visit.      Subjective Assessment - 05/20/16 1549    Subjective Pt reports feeling good today.    Patient Stated Goals getting into/out of car, dance   Currently in Pain? No/denies            [facility] PT Assessment - 05/20/16 0001      Strength   Right/Left Hip Right;Left   Right Hip Flexion 5/5   Right Hip Extension 5/5   Right Hip ABduction 5/5   Left Hip Flexion 5/5   Left Hip  Extension 5/5   Left Hip ABduction 5/5                     [facility] Adult PT Treatment/Exercise - 05/20/16 0001      Lumbar Exercises: Prone   Other Prone Lumbar Exercises elbow/knee planks     Knee/Hip Exercises: Stretches   Passive Hamstring Stretch Limitations seated EOB   Quad Stretch Limitations thomas test position & standing runners   Other Knee/Hip Stretches figure 4 piriformis stretch     Knee/Hip Exercises: Standing   Functional Squat Limitations squat to tap floor, stand reach overhead     Knee/Hip Exercises: Supine   Bridges with [employer] 10 reps                PT Education - 05/20/16 1553    Education provided Yes   Education Details exercise form/rationale, how to manage spasm with stretching   Person(s) Educated Patient   Methods Explanation;Demonstration;Tactile cues;Verbal cues   Comprehension Verbalized understanding;Returned demonstration;Verbal cues required;Tactile cues required;Need further instruction          PT Short Term Goals - 05/20/16 1555      PT SHORT TERM GOAL #1   Title Pt will demo 5/5 MMT in all hip motions to indicate proper support to pelvic region by surrounding musculature   Baseline see flowsheet  Status Achieved     PT SHORT TERM GOAL #2   Title FOTO to 81% ability to indicate significant improvement in functional ability   Baseline 67% ability at eval   Status Unable to assess     PT SHORT TERM GOAL #3   Title Pt will demo proper squat/lift and turning motion similar to work activities with abdominal engagement to reduce risk of rotation   Baseline good posture and control without pain   Status Achieved     PT SHORT TERM GOAL #4   Title Pt will be able to get into/out of car without hip pain   Baseline denies pain   Status Achieved     PT SHORT TERM GOAL #5   Title pt will be able to return to dancing with knowledge of proper abdominal control and stretching   Baseline mild discomfort in SIJ when  hips move to R, focuses on using core musculature   Status Partially Met                  Plan - 05/20/16 1635    Clinical Impression Statement Educated pt on use of stretches to decrease spasm rather than exercises. Increased challenges to gluts and core to improve lumbo pelvic stability   PT Next Visit Plan planks, squats-lumbo pelvic stability   PT Home Exercise Plan supine ball squeeze, piriformis stretch, pelvic tilt/abdominal engagement all positions, home roller ITB & piriformis, bridge ball squeeze, SLR in ER, child pose   Consulted and Agree with Plan of Care Patient      Patient will benefit from skilled therapeutic intervention in order to improve the following deficits and impairments:     Visit Diagnosis: Pain in right hip  Difficulty in walking, not elsewhere classified     Problem List Patient Active Problem List   Diagnosis Date Noted  . HTN (hypertension) 04/13/2016  . Menorrhagia 08/26/2015    Class: Present on Admission    Becca Bayne C. Meghna Hagmann PT, DPT 05/20/16 4:38 PM   [facility] [address] [place], [place], [address] Phone: [phone]   Fax:  [phone]  Name: Erin [name] MRN: [id number] Date of Birth: [birth year]   PHYSICAL THERAPY DISCHARGE SUMMARY  Visits from Start of Care: 7  Current functional level related to goals / functional outcomes: See above   Remaining deficits: See above   Education / Equipment: Anatomy of condition, POC, HEP, exercise form/rationale  Plan: Patient agrees to discharge.  Patient goals were partially met. Patient is being discharged due to not returning since the last visit.  ?????    Jobany Montellano C. Dulcinea Kinser PT, DPT 06/10/16 9:52 AM

## 2016-05-25 ENCOUNTER — Ambulatory Visit: Payer: 59 | Admitting: Physical Therapy

## 2016-05-27 ENCOUNTER — Ambulatory Visit: Payer: 59 | Admitting: Physical Therapy

## 2016-05-27 ENCOUNTER — Ambulatory Visit (INDEPENDENT_AMBULATORY_CARE_PROVIDER_SITE_OTHER): Payer: 59 | Admitting: Physician Assistant

## 2016-05-27 VITALS — BP 156/92 | HR 73 | Temp 98.7°F | Resp 17 | Ht 64.5 in | Wt 206.0 lb

## 2016-05-27 DIAGNOSIS — R630 Anorexia: Secondary | ICD-10-CM | POA: Diagnosis not present

## 2016-05-27 DIAGNOSIS — R110 Nausea: Secondary | ICD-10-CM

## 2016-05-27 DIAGNOSIS — R5383 Other fatigue: Secondary | ICD-10-CM

## 2016-05-27 DIAGNOSIS — E119 Type 2 diabetes mellitus without complications: Secondary | ICD-10-CM | POA: Diagnosis not present

## 2016-05-27 DIAGNOSIS — I951 Orthostatic hypotension: Secondary | ICD-10-CM | POA: Diagnosis not present

## 2016-05-27 LAB — POCT URINALYSIS DIP (MANUAL ENTRY)
Blood, UA: NEGATIVE
Glucose, UA: NEGATIVE mg/dL
Leukocytes, UA: NEGATIVE
Nitrite, UA: NEGATIVE
Protein Ur, POC: 100 mg/dL — AB
Spec Grav, UA: >=1.03 — AB (ref 1.010–1.025)
Urobilinogen, UA: 1 U/dL
pH, UA: 5.5 (ref 5.0–8.0)

## 2016-05-27 LAB — POC MICROSCOPIC URINALYSIS (UMFC): Mucus: ABSENT

## 2016-05-27 LAB — POCT GLYCOSYLATED HEMOGLOBIN (HGB A1C): Hemoglobin A1C: 5.7

## 2016-05-27 LAB — GLUCOSE, POCT (MANUAL RESULT ENTRY): POC Glucose: 76 mg/dl (ref 70–99)

## 2016-05-27 MED ORDER — METFORMIN HCL 500 MG PO TABS: 500.0000 mg | ORAL_TABLET | Freq: Every day | ORAL | 3 refills | Status: DC

## 2016-05-27 MED ORDER — ONDANSETRON HCL 4 MG PO TABS: 4.0000 mg | ORAL_TABLET | Freq: Three times a day (TID) | ORAL | 0 refills | Status: DC | PRN

## 2016-05-27 MED FILL — ONDANSETRON HCL 4 MG TABLET: 4 | 7 days supply | Qty: 20 | Fill #0

## 2016-05-27 MED FILL — metFORMIN HCL 500 MG TABS: 500 | 30 days supply | Qty: 30 | Fill #0

## 2016-05-27 NOTE — Patient Instructions (Addendum)
I will contact you with the rest of your lab results when they come back. In the meantime, please see below for food ideas while you are sick.  Zofran is for nausea. Max dose is 79m.  Come back in 3-5 days if you are not feeling better.   Thank you for coming in today. I hope you feel we met your needs.  Feel free to call [facility] if you have any questions or further requests.  Please consider signing up for MyChart if you do not already have it, as this is a great way to communicate with me.  Best,  Whitney McVey, PA-C   Bland Diet A bland diet consists of foods that do not have a lot of fat or fiber. Foods without fat or fiber are easier for the body to digest. They are also less likely to irritate your mouth, throat, stomach, and other parts of your gastrointestinal tract. A bland diet is sometimes called a BRAT diet. What is my plan? Your health care provider or dietitian may recommend specific changes to your diet to prevent and treat your symptoms, such as:  Eating small meals often.  Cooking food until it is soft enough to chew easily.  Chewing your food well.  Drinking fluids slowly.  Not eating foods that are very spicy, sour, or fatty.  Not eating citrus fruits, such as oranges and grapefruit. What do I need to know about this diet?  Eat a variety of foods from the bland diet food list.  Do not follow a bland diet longer than you have to.  Ask your health care provider whether you should take vitamins. What foods can I eat? Grains   Hot cereals, such as cream of wheat. Bread, crackers, or tortillas made from refined white flour. Rice. Vegetables  Canned or cooked vegetables. Mashed or boiled potatoes. Fruits  Bananas. Applesauce. Other types of cooked or canned fruit with the skin and seeds removed, such as canned peaches or pears. Meats and Other Protein Sources  Scrambled eggs. Creamy peanut butter or other nut butters. Lean, well-cooked meats, such as chicken or  fish. Tofu. Soups or broths. Dairy  Low-fat dairy products, such as milk, cottage cheese, or yogurt. Beverages  Water. Herbal tea. Apple juice. Sweets and Desserts  Pudding. Custard. Fruit gelatin. Ice cream. Fats and Oils  Mild salad dressings. Canola or olive oil. The items listed above may not be a complete list of allowed foods or beverages. Contact your dietitian for more options.  What foods are not recommended? Foods and ingredients that are often not recommended include:  Spicy foods, such as hot sauce or salsa.  Fried foods.  Sour foods, such as pickled or fermented foods.  Raw vegetables or fruits, especially citrus or berries.  Caffeinated drinks.  Alcohol.  Strongly flavored seasonings or condiments. The items listed above may not be a complete list of foods and beverages that are not allowed. Contact your dietitian for more information.  This information is not intended to replace advice given to you by your health care provider. Make sure you discuss any questions you have with your health care provider. Document Released: 05/20/2015 Document Revised: 07/04/2015 Document Reviewed: 02/07/2014 Elsevier Interactive Patient Education  2017 [employer]    IF you received an x-ray today, you will receive an invoice from [facility]. Please contact [facility] at [phone] questions or concerns regarding your invoice.   IF you received labwork today, you will receive an invoice from  [facility]. Please contact [facility] at [phone] with questions or concerns regarding your invoice.   Our billing staff will not be able to assist you with questions regarding bills from these companies.  You will be contacted with the lab results as soon as they are available. The fastest way to get your results is to activate your My Chart account. Instructions are located on the last page of this paperwork. If you have not heard from [place] regarding the results  in 2 weeks, please contact this office.

## 2016-05-27 NOTE — Progress Notes (Signed)
Erin [name]  MRN: [id number] DOB: 04/30/1965  PCP: Harvie Junior, MD  Subjective:  Pt is a 51 year old female PMH HTN and DM who presents to clinic for dizziness, nausea, fatigue and anorexia.  Tiredness started last week. She feels dizzy when she stands up from a seated position.  No appetite for the past 4 days. Yesterday she had a pork biscuit from [employer] and tea - "I had to eat to take my medication". Nothing the day before. Nothing today. Last bowel movement was two days ago. Last flatulence last night. She recently started potassium supplement about one month ago.  She noticed this affects the regularity of BM - she no longer has a BM daily.  Denies nausea, vomiting, diarrhea, chills, fever, night sweats, abdominal pain.   H/o DM - dx in 2016. Started metformin 2017. Last sugar check Dec 2016. She takes Metformin q daily. Her prescribing practitioner asked her to take bid, however she did not like this, so she is taking qd.   She is almost out of her Metformin and would like a refill.   h/o partial thyroidectomy 2010 - she had a goiter that was removed.   LMP - July 2017. S/p ablation due to menorrhagia.  Menopause - she has been experiencing hot flashes and cold chills x 10 years.   Review of Systems  Constitutional: Positive for appetite change and fatigue. Negative for chills and fever.  Cardiovascular: Negative for chest pain and palpitations.  Gastrointestinal: Positive for nausea. Negative for abdominal pain, blood in stool, constipation, diarrhea and vomiting.  Endocrine: Negative for cold intolerance, heat intolerance, polydipsia, polyphagia and polyuria.  Neurological: Positive for dizziness. Negative for weakness.    Patient Active Problem List   Diagnosis Date Noted  . HTN (hypertension) 04/13/2016  . Menorrhagia 08/26/2015    Class: Present on Admission    Current Outpatient Prescriptions on File Prior to Visit  Medication Sig Dispense Refill  .  amLODipine (NORVASC) 5 MG tablet Take 1 tablet (5 mg total) by mouth daily. 180 tablet 3  . citalopram (CELEXA) 20 MG tablet Take 20 mg by mouth every morning.    . metFORMIN (GLUCOPHAGE) 500 MG tablet Take by mouth daily with breakfast.    . olmesartan-hydrochlorothiazide (BENICAR HCT) 40-25 MG tablet Take 1 tablet by mouth daily. 30 tablet 11  . omeprazole (PRILOSEC) 40 MG capsule Take 40 mg by mouth every morning.    . potassium chloride (K-DUR) 10 MEQ tablet Take 1 tablet (10 mEq total) by mouth daily. 30 tablet 2   No current facility-administered medications on file prior to visit.     No Known Allergies   Objective:  BP (!) 156/92   Pulse 73   Temp 98.7 F (37.1 C) (Oral)   Resp 17   Ht 5' 4.5" (1.638 m)   Wt 206 lb (93.4 kg)   SpO2 97%   BMI 34.81 kg/m   Physical Exam  Constitutional: She is oriented to person, place, and time and well-developed, well-nourished, and in no distress. No distress.  Eyes: Conjunctivae are normal. Pupils are equal, round, and reactive to light.  Neck: Normal range of motion. Neck supple.  Cardiovascular: Normal rate, regular rhythm and normal heart sounds.   Pulmonary/Chest: Effort normal and breath sounds normal. No respiratory distress. She has no wheezes. She has no rales.  Abdominal: Soft. Normal appearance and bowel sounds are normal. There is no hepatosplenomegaly. There is tenderness (mild tenderness right side). There  is no rigidity, no guarding, no CVA tenderness, no tenderness at McBurney's point and negative Murphy's sign.  Neurological: She is alert and oriented to person, place, and time. GCS score is 15.  Skin: Skin is warm and dry.  Psychiatric: Mood, memory, affect and judgment normal.  Vitals reviewed.  Results for orders placed or performed in visit on 05/27/16  POCT glycosylated hemoglobin (Hb A1C)  Result Value Ref Range   Hemoglobin A1C 5.7   POCT urinalysis dipstick  Result Value Ref Range   Color, UA yellow yellow     Clarity, UA clear clear   Glucose, UA negative negative mg/dL   Bilirubin, UA moderate (A) negative   Ketones, POC UA small (15) (A) negative mg/dL   Spec Grav, UA >=1.030 (A) 1.010 - 1.025   Blood, UA negative negative   pH, UA 5.5 5.0 - 8.0   Protein Ur, POC =100 (A) negative mg/dL   Urobilinogen, UA 1.0 0.2 or 1.0 E.U./dL   Nitrite, UA Negative Negative   Leukocytes, UA Negative Negative  POCT Microscopic Urinalysis ([facility])  Result Value Ref Range   WBC,UR,HPF,POC Few (A) None WBC/hpf   RBC,UR,HPF,POC None None RBC/hpf   Bacteria Few (A) None, Too numerous to count   Mucus Absent Absent   Epithelial Cells, UR Per Microscopy Few (A) None, Too numerous to count cells/hpf  POCT glucose (manual entry)  Result Value Ref Range   POC Glucose 76 70 - 99 mg/dl   Orthostatic VS for the past 24 hrs:  BP- Lying Pulse- Lying BP- Sitting Pulse- Sitting BP- Standing at 0 minutes Pulse- Standing at 0 minutes  05/27/16 1543 158/90 54 139/86 66 117/78 72    Assessment and Plan :  1. Fatigue, unspecified type 2. Decreased appetite - TSH - CBC with Differential/Platelet - CMP14+EGFR - POCT glycosylated hemoglobin (Hb A1C) - Urine culture - POCT urinalysis dipstick - POCT Microscopic Urinalysis ([facility]) - Orthostatic vital signs - POCT glucose (manual entry) - Suspect a viral illness.  Labs are pending. Will contact with results. Encouraged BRAT diet until symptoms resolve. Encouraged her to push fluids. RTC if no improvement in 5-7 days.   3. Orthostatic hypotension 4. Nausea without vomiting - PR NORMAL SALINE SOLUTION INFUS - PR CATH IMPL VASC ACCESS PORTAL - ondansetron (ZOFRAN) 4 MG tablet; Take 1 tablet (4 mg total) by mouth every 8 (eight) hours as needed for nausea or vomiting.  Dispense: 20 tablet; Refill: 0 - Pt reports some improvement after IVF.   5. Type 2 diabetes mellitus without complication, without long-term current use of insulin (HCC) - metFORMIN (GLUCOPHAGE) 500 MG  tablet; Take 1 tablet (500 mg total) by mouth daily with breakfast.  Dispense: 30 tablet; Refill: 3 - A1C and blood sugar are acceptable today. She needs to establish care with regular check-ups, as she has not had f/u with PCP in >1 year. RTC in 1-2 months for diabetes check-up.   Mercer Pod, PA-C  [facility] [place] 05/27/2016 3:15 PM

## 2016-05-28 LAB — CBC WITH DIFFERENTIAL/PLATELET
Basophils Absolute: 0 10*3/uL (ref 0.0–0.2)
Basos: 0 %
EOS (ABSOLUTE): 0.1 10*3/uL (ref 0.0–0.4)
Eos: 1 %
Hematocrit: 40.8 % (ref 34.0–46.6)
Hemoglobin: 13.7 g/dL (ref 11.1–15.9)
Immature Grans (Abs): 0 10*3/uL (ref 0.0–0.1)
Immature Granulocytes: 0 %
Lymphocytes Absolute: 2.2 10*3/uL (ref 0.7–3.1)
Lymphs: 25 %
MCH: 31.3 pg (ref 26.6–33.0)
MCHC: 33.6 g/dL (ref 31.5–35.7)
MCV: 93 fL (ref 79–97)
Monocytes Absolute: 0.7 10*3/uL (ref 0.1–0.9)
Monocytes: 8 %
Neutrophils Absolute: 5.9 10*3/uL (ref 1.4–7.0)
Neutrophils: 66 %
Platelets: 295 10*3/uL (ref 150–379)
RBC: 4.38 x10E6/uL (ref 3.77–5.28)
RDW: 15.5 % — ABNORMAL HIGH (ref 12.3–15.4)
WBC: 8.9 10*3/uL (ref 3.4–10.8)

## 2016-05-28 LAB — URINE CULTURE: Organism ID, Bacteria: NO GROWTH

## 2016-05-28 LAB — CMP14+EGFR
ALT: 16 IU/L (ref 0–32)
AST: 17 IU/L (ref 0–40)
Albumin/Globulin Ratio: 1.2 (ref 1.2–2.2)
Albumin: 3.9 g/dL (ref 3.5–5.5)
Alkaline Phosphatase: 84 IU/L (ref 39–117)
BUN/Creatinine Ratio: 18 (ref 9–23)
BUN: 17 mg/dL (ref 6–24)
Bilirubin Total: 0.6 mg/dL (ref 0.0–1.2)
CO2: 22 mmol/L (ref 18–29)
Calcium: 9.9 mg/dL (ref 8.7–10.2)
Chloride: 99 mmol/L (ref 96–106)
Creatinine, Ser: 0.93 mg/dL (ref 0.57–1.00)
GFR calc Af Amer: 83 mL/min/{1.73_m2} (ref >59)
GFR calc non Af Amer: 72 mL/min/{1.73_m2} (ref >59)
Globulin, Total: 3.2 g/dL (ref 1.5–4.5)
Glucose: 72 mg/dL (ref 65–99)
Potassium: 3.6 mmol/L (ref 3.5–5.2)
Sodium: 141 mmol/L (ref 134–144)
Total Protein: 7.1 g/dL (ref 6.0–8.5)

## 2016-05-28 LAB — TSH: TSH: 1.17 u[IU]/mL (ref 0.450–4.500)

## 2016-05-29 MED FILL — OLMESARTAN-HCTZ 40-25 MG TA: 40-25 | 30 days supply | Qty: 30 | Fill #2

## 2016-05-29 MED FILL — POTASSIUM CL 10 MEQ TAB SA: 10 | 30 days supply | Qty: 30 | Fill #1

## 2016-05-29 MED FILL — CITALOPRAM HBR 20 MG TABLET: 20 | 30 days supply | Qty: 30 | Fill #3

## 2016-06-01 ENCOUNTER — Encounter: Payer: Self-pay | Admitting: Physician Assistant

## 2016-06-01 ENCOUNTER — Ambulatory Visit (INDEPENDENT_AMBULATORY_CARE_PROVIDER_SITE_OTHER): Payer: 59

## 2016-06-01 ENCOUNTER — Ambulatory Visit (INDEPENDENT_AMBULATORY_CARE_PROVIDER_SITE_OTHER): Payer: 59 | Admitting: Physician Assistant

## 2016-06-01 VITALS — BP 153/90 | HR 54 | Temp 98.5°F | Resp 16 | Ht 63.75 in | Wt 205.8 lb

## 2016-06-01 DIAGNOSIS — R109 Unspecified abdominal pain: Secondary | ICD-10-CM | POA: Diagnosis not present

## 2016-06-01 DIAGNOSIS — Z6835 Body mass index (BMI) 35.0-35.9, adult: Secondary | ICD-10-CM | POA: Diagnosis not present

## 2016-06-01 DIAGNOSIS — E669 Obesity, unspecified: Secondary | ICD-10-CM | POA: Diagnosis not present

## 2016-06-01 DIAGNOSIS — E66813 Obesity, class 3: Secondary | ICD-10-CM | POA: Insufficient documentation

## 2016-06-01 DIAGNOSIS — R1032 Left lower quadrant pain: Secondary | ICD-10-CM | POA: Diagnosis not present

## 2016-06-01 DIAGNOSIS — K5900 Constipation, unspecified: Secondary | ICD-10-CM

## 2016-06-01 DIAGNOSIS — R030 Elevated blood-pressure reading, without diagnosis of hypertension: Secondary | ICD-10-CM | POA: Diagnosis not present

## 2016-06-01 DIAGNOSIS — R110 Nausea: Secondary | ICD-10-CM

## 2016-06-01 DIAGNOSIS — E66812 Obesity, class 2: Secondary | ICD-10-CM

## 2016-06-01 LAB — POCT CBC
Granulocyte percent: 61.9 %G (ref 37–80)
HCT, POC: 38.5 % (ref 37.7–47.9)
Hemoglobin: 13.5 g/dL (ref 12.2–16.2)
Lymph, poc: 2.3 (ref 0.6–3.4)
MCH, POC: 32.5 pg — AB (ref 27–31.2)
MCHC: 35 g/dL (ref 31.8–35.4)
MCV: 92.7 fL (ref 80–97)
MID (cbc): 0.7 (ref 0–0.9)
MPV: 6.9 fL (ref 0–99.8)
POC Granulocyte: 4.8 (ref 2–6.9)
POC LYMPH PERCENT: 29.5 %L (ref 10–50)
POC MID %: 8.6 %M (ref 0–12)
Platelet Count, POC: 263 10*3/uL (ref 142–424)
RBC: 4.25 M/uL (ref 4.04–5.48)
RDW, POC: 15.1 %
WBC: 7.7 10*3/uL (ref 4.6–10.2)

## 2016-06-01 LAB — POCT URINALYSIS DIP (MANUAL ENTRY)
Bilirubin, UA: NEGATIVE
Blood, UA: NEGATIVE
Glucose, UA: NEGATIVE mg/dL
Ketones, POC UA: NEGATIVE mg/dL
Leukocytes, UA: NEGATIVE
Nitrite, UA: NEGATIVE
Protein Ur, POC: NEGATIVE mg/dL
Spec Grav, UA: 1.015 (ref 1.010–1.025)
Urobilinogen, UA: 1 E.U./dL
pH, UA: 6 (ref 5.0–8.0)

## 2016-06-01 LAB — POC MICROSCOPIC URINALYSIS (UMFC): Mucus: ABSENT

## 2016-06-01 NOTE — Progress Notes (Signed)
Erin [name]  MRN: [id number] DOB: 1965/11/23  PCP: Harvie Junior, MD  Subjective:  Pt is a 51 year old female who presents to clinic for f/u fatigue.   She was here 4/18 - At that time she c/o tiredness x 1 week, dizziness when she stands up from a seated position, lack of appetite. She was given IVF and felt an improvement. Treatment plan was based on suspected viral illness. She was d/c'd with zofran and advised bland diet.   Today c/o chills and nausea, no appetitive. Fatigue has improved. She has been feeling poorly about 2 weeks now. She ate can of soup this morning. Since she was here last she has had soup, water and some peaches.  Endorses mild runny nose today. Mild abdominal pain "nagging hurting".  She is urinating 5-6 x/day. Last BM yesterday - well formed stool. One episode of vomiting after she smelled something that did not agree with her.  No known sick contacts.  LMP July 2017 - s/p ablation and tubal ligation.  Denies blood in stool, abnormal color of stool, diarrhea, chest pain, palpitations.   Recent medications - she started Potassium about one month ago by the recommendation of her PCP. Since that time she admits to irregular bowel movements - Occurs less frequently and she feels more constipated.    Review of Systems  Constitutional: Positive for appetite change and chills. Negative for diaphoresis, fatigue and fever.  HENT: Negative for congestion, postnasal drip, rhinorrhea, sinus pressure, sneezing and sore throat.   Respiratory: Negative for cough, chest tightness, shortness of breath and wheezing.   Cardiovascular: Negative for chest pain and palpitations.  Gastrointestinal: Positive for abdominal pain, nausea and vomiting. Negative for diarrhea.  Genitourinary: Negative for dysuria, flank pain, frequency and urgency.  Neurological: Negative for dizziness, weakness, light-headedness and headaches.    Patient Active Problem List   Diagnosis Date  Noted  . Diabetes ([place]) 05/27/2016  . HTN (hypertension) 04/13/2016  . Menorrhagia 08/26/2015    Class: Present on Admission    Current Outpatient Prescriptions on File Prior to Visit  Medication Sig Dispense Refill  . amLODipine (NORVASC) 5 MG tablet Take 1 tablet (5 mg total) by mouth daily. 180 tablet 3  . citalopram (CELEXA) 20 MG tablet Take 20 mg by mouth every morning.    . metFORMIN (GLUCOPHAGE) 500 MG tablet Take 1 tablet (500 mg total) by mouth daily with breakfast. 30 tablet 3  . olmesartan-hydrochlorothiazide (BENICAR HCT) 40-25 MG tablet Take 1 tablet by mouth daily. 30 tablet 11  . omeprazole (PRILOSEC) 40 MG capsule Take 40 mg by mouth every morning.    . ondansetron (ZOFRAN) 4 MG tablet Take 1 tablet (4 mg total) by mouth every 8 (eight) hours as needed for nausea or vomiting. 20 tablet 0  . potassium chloride (K-DUR) 10 MEQ tablet Take 1 tablet (10 mEq total) by mouth daily. 30 tablet 2   No current facility-administered medications on file prior to visit.     No Known Allergies   Objective:  BP (!) 150/74   Pulse 65   Temp 98.5 F (36.9 C) (Oral)   Resp 16   Ht 5' 3.75" (1.619 m)   Wt 205 lb 12.8 oz (93.4 kg)   SpO2 100%   BMI 35.60 kg/m   Physical Exam  Constitutional: She is oriented to person, place, and time and well-developed, well-nourished, and in no distress. She appears not jaundiced.  Non-toxic appearance. She does not have  a sickly appearance. No distress.  obese  HENT:  Right Ear: Tympanic membrane normal.  Left Ear: Tympanic membrane normal.  Mouth/Throat: Uvula is midline, oropharynx is clear and moist and mucous membranes are normal.  Eyes: Conjunctivae and lids are normal. Pupils are equal, round, and reactive to light. No scleral icterus.  Cardiovascular: Normal rate, regular rhythm, normal heart sounds and normal pulses.  Exam reveals no gallop.   Pulmonary/Chest: Effort normal and breath sounds normal.  Abdominal: Soft. Normal  appearance and bowel sounds are normal. There is no hepatosplenomegaly. There is tenderness in the left lower quadrant. There is no rigidity, no rebound, no guarding and negative Murphy's sign.  Neurological: She is alert and oriented to person, place, and time. GCS score is 15.  Skin: Skin is warm and dry.  Psychiatric: Mood, memory, affect and judgment normal.  Vitals reviewed.   Results for orders placed or performed in visit on 06/01/16  POCT urinalysis dipstick  Result Value Ref Range   Color, UA yellow yellow   Clarity, UA clear clear   Glucose, UA negative negative mg/dL   Bilirubin, UA negative negative   Ketones, POC UA negative negative mg/dL   Spec Grav, UA 1.015 1.010 - 1.025   Blood, UA negative negative   pH, UA 6.0 5.0 - 8.0   Protein Ur, POC negative negative mg/dL   Urobilinogen, UA 1.0 0.2 or 1.0 E.U./dL   Nitrite, UA Negative Negative   Leukocytes, UA Negative Negative  POCT Microscopic Urinalysis ([facility])  Result Value Ref Range   WBC,UR,HPF,POC Few (A) None WBC/hpf   RBC,UR,HPF,POC None None RBC/hpf   Bacteria Few (A) None, Too numerous to count   Mucus Absent Absent   Epithelial Cells, UR Per Microscopy None None, Too numerous to count cells/hpf  POCT CBC  Result Value Ref Range   WBC 7.7 4.6 - 10.2 K/uL   Lymph, poc 2.3 0.6 - 3.4   POC LYMPH PERCENT 29.5 10 - 50 %L   MID (cbc) 0.7 0 - 0.9   POC MID % 8.6 0 - 12 %M   POC Granulocyte 4.8 2 - 6.9   Granulocyte percent 61.9 37 - 80 %G   RBC 4.25 4.04 - 5.48 M/uL   Hemoglobin 13.5 12.2 - 16.2 g/dL   HCT, POC 38.5 37.7 - 47.9 %   MCV 92.7 80 - 97 fL   MCH, POC 32.5 (A) 27 - 31.2 pg   MCHC 35.0 31.8 - 35.4 g/dL   RDW, POC 15.1 %   Platelet Count, POC 263 142 - 424 K/uL   MPV 6.9 0 - 99.8 fL   Dg Abd 2 Views  Result Date: 06/01/2016 CLINICAL DATA:  Abdominal pain for 2 weeks EXAM: ABDOMEN - 2 VIEW COMPARISON:  None. FINDINGS: There is a moderate amount of stool in the descending colon. The bowel gas  pattern is normal. There is no evidence of free air. No radio-opaque calculi or other significant radiographic abnormality is seen. IMPRESSION: Negative. Electronically Signed   By: Kathreen Devoid   On: 06/01/2016 15:00    Assessment and Plan :  1. Left lower quadrant pain 2. Nausea without vomiting 3. Constipation, unspecified constipation type 4. Class 2 obesity with body mass index (BMI) of 35.0 to 35.9 in adult, unspecified obesity type, unspecified whether serious comorbidity present - POCT urinalysis dipstick - POCT Microscopic Urinalysis ([facility]) - POCT CBC - DG Abd 2 Views; Future - Amylase - Lipase - CMP14+EGFR - CT ABDOMEN  PELVIS W WO CONTRAST; Future - CT Abdomen Pelvis W Contrast; Future - This is pt's second visit for same c/c. Her condition is not improving, however it is not worsening. Pt is not in acute distress at this time. Not concerned for acute abdomen. Today's labs wnl.  X-ray shows moderate stool burden, however this does not explain physical exam findings and is not consistent with her HPI. Concern for possible diverticulitis. Will send for imaging and call with results.  Labs are pending.   5. Elevated blood pressure reading - Recheck vitals  Mercer Pod, PA-C  [facility] 06/01/2016 2:22 PM

## 2016-06-01 NOTE — Patient Instructions (Addendum)
  GO TO [facility] 06/02/16   445PM     315   [place] TO EAT AFTER 1 PM  DRINK CONTRAST #1 DRINK FIRST 3PM DRINK CONTRAST #2 [facility] will contact you with results  IF you received an x-ray today, you will receive an invoice from [facility]. Please contact [facility] at [phone] with questions or concerns regarding your invoice.   IF you received labwork today, you will receive an invoice from [facility]. Please contact [facility] at [phone] with questions or concerns regarding your invoice.   Our billing staff will not be able to assist you with questions regarding bills from these companies.  You will be contacted with the lab results as soon as they are available. The fastest way to get your results is to activate your My Chart account. Instructions are located on the last page of this paperwork. If you have not heard from [place] regarding the results in 2 weeks, please contact this office.

## 2016-06-02 ENCOUNTER — Ambulatory Visit
Admission: RE | Admit: 2016-06-02 | Discharge: 2016-06-02 | Disposition: A | Discharge status: Home / Self Care | Payer: 59 | Source: Ambulatory Visit | Attending: Physician Assistant | Admitting: Physician Assistant

## 2016-06-02 DIAGNOSIS — R1032 Left lower quadrant pain: Secondary | ICD-10-CM | POA: Diagnosis not present

## 2016-06-02 LAB — CMP14+EGFR
ALT: 14 IU/L (ref 0–32)
AST: 13 IU/L (ref 0–40)
Albumin/Globulin Ratio: 1.2 (ref 1.2–2.2)
Albumin: 3.7 g/dL (ref 3.5–5.5)
Alkaline Phosphatase: 76 IU/L (ref 39–117)
BUN/Creatinine Ratio: 21 (ref 9–23)
BUN: 18 mg/dL (ref 6–24)
Bilirubin Total: 0.4 mg/dL (ref 0.0–1.2)
CO2: 26 mmol/L (ref 18–29)
Calcium: 9.7 mg/dL (ref 8.7–10.2)
Chloride: 105 mmol/L (ref 96–106)
Creatinine, Ser: 0.87 mg/dL (ref 0.57–1.00)
GFR calc Af Amer: 90 mL/min/{1.73_m2} (ref >59)
GFR calc non Af Amer: 78 mL/min/{1.73_m2} (ref >59)
Globulin, Total: 3 g/dL (ref 1.5–4.5)
Glucose: 110 mg/dL — ABNORMAL HIGH (ref 65–99)
Potassium: 3.7 mmol/L (ref 3.5–5.2)
Sodium: 144 mmol/L (ref 134–144)
Total Protein: 6.7 g/dL (ref 6.0–8.5)

## 2016-06-02 LAB — AMYLASE: Amylase: 88 U/L (ref 31–124)

## 2016-06-02 LAB — LIPASE: Lipase: 37 U/L (ref 14–72)

## 2016-06-02 MED ORDER — IOPAMIDOL (ISOVUE-300) INJECTION 61%
125.0000 mL | Freq: Once | INTRAVENOUS | Status: AC | PRN
  Administered 2016-06-02: 125 mL via INTRAVENOUS

## 2016-06-02 MED ORDER — IOHEXOL 300 MG/ML  SOLN
20.0000 mL | Freq: Once | INTRAMUSCULAR | Status: AC | PRN
  Administered 2016-06-02: 20 mL via ORAL

## 2016-06-03 ENCOUNTER — Ambulatory Visit (INDEPENDENT_AMBULATORY_CARE_PROVIDER_SITE_OTHER): Payer: 59 | Admitting: Podiatry

## 2016-06-03 DIAGNOSIS — L84 Corns and callosities: Secondary | ICD-10-CM | POA: Diagnosis not present

## 2016-06-03 DIAGNOSIS — M7751 Other enthesopathy of right foot: Secondary | ICD-10-CM

## 2016-06-03 DIAGNOSIS — L851 Acquired keratosis [keratoderma] palmaris et plantaris: Secondary | ICD-10-CM

## 2016-06-04 ENCOUNTER — Telehealth: Payer: Self-pay | Admitting: Physician Assistant

## 2016-06-04 NOTE — Telephone Encounter (Signed)
Patient needs FMLA forms completed by [facility], I am not sure which OV she is needing this completed for so I have not filled anything out. I will place the forms in your box on 06/04/16 if you could please return them to the FMLA/Disability box at the 102 checkout desk within 5-7 business days. Thank you!

## 2016-06-05 ENCOUNTER — Other Ambulatory Visit: Payer: Self-pay | Admitting: Physician Assistant

## 2016-06-05 ENCOUNTER — Encounter: Payer: Self-pay | Admitting: Physician Assistant

## 2016-06-05 DIAGNOSIS — K5900 Constipation, unspecified: Secondary | ICD-10-CM

## 2016-06-05 MED ORDER — POLYETHYLENE GLYCOL 3350 17 GM/SCOOP PO POWD: 17.0000 g | Freq: Two times a day (BID) | ORAL | 1 refills | Status: DC | PRN

## 2016-06-05 MED ORDER — BISACODYL 10 MG RE SUPP: 10.0000 mg | Freq: Every day | RECTAL | 0 refills | Status: DC | PRN

## 2016-06-05 NOTE — Progress Notes (Signed)
CT is negative. Will treat for constipation.

## 2016-06-05 NOTE — Progress Notes (Signed)
   Subjective: Patient presents to the office today for chief complaint of painful callus lesions of the feet. Patient states that the pain is ongoing and is affecting their ability to ambulate without pain. Patient presents today for further treatment and evaluation.  Objective:  Physical Exam General: Alert and oriented x3 in no acute distress  Dermatology: Hyperkeratotic lesion present on the left fifth toe. Pain on palpation with a central nucleated core noted.  Skin is warm, dry and supple bilateral lower extremities. Negative for open lesions or macerations.  Vascular: Palpable pedal pulses bilaterally. No edema or erythema noted. Capillary refill within normal limits.  Neurological: Epicritic and protective threshold grossly intact bilaterally.   Musculoskeletal Exam: Pain on palpation at the keratotic lesion noted. Range of motion within normal limits bilateral. Muscle strength 5/5 in all groups bilateral.  Assessment: #1 Corn left fifth toe   Plan of Care:  #1 Patient evaluated #2 injection of 0.5 mLs of [place] Soluspan injected into the right fifth toe #3 Excisional debridement of keratoic lesion using a chisel blade was performed without incident.  #4 Treated area(s) with Salinocaine and dressed with light dressing. #5 Patient is to return to the clinic PRN.   Edrick Kins, DPM [facility]  Dr. Edrick Kins, [address]                                        [place], [place] [address]                Office [phone]  Fax [phone]

## 2016-06-07 MED ORDER — BETAMETHASONE SOD PHOS & ACET 6 (3-3) MG/ML IJ SUSP: 3.0000 mg | Freq: Once | INTRAMUSCULAR | Status: DC

## 2016-06-08 ENCOUNTER — Encounter: Payer: Self-pay | Admitting: Physician Assistant

## 2016-06-08 MED FILL — POLYETHYLENE GLYCOL 3350 PO: 16 days supply | Qty: 527 | Fill #0

## 2016-06-08 NOTE — Telephone Encounter (Signed)
Forms are filled out and placed in FMLA/disability box.  Thank you!

## 2016-06-08 NOTE — Telephone Encounter (Signed)
Patient also needs [facility] Disability Provider statement completed by Mercer Pod, I have completed what I could from the [facility] notes and highlighted the areas that need to be finished. I will place the forms in your box on 06/08/16 please return them tot he FMLA/Disability box at the 102 checkout desk within 5-7 business days. Thank you!

## 2016-06-09 NOTE — Telephone Encounter (Signed)
Patients FMLA forms were completed and faxed to [employer] on 06/09/16 but I am still waiting on Attending Provider Statement which I placed in [facility]'s box on 06/08/16.

## 2016-06-12 NOTE — Telephone Encounter (Signed)
Attending Provider statement forms put in [facility] bin May 3

## 2016-06-12 NOTE — Telephone Encounter (Signed)
Provider attending statement was scanned and faxed to [employer] disability on 06/12/16

## 2016-06-28 NOTE — Progress Notes (Deleted)
Cardiology Office Note   Date:  06/28/2016   ID:  Erin [name], DOB [birth year], MRN [id number]  PCP:  Harvie Junior, MD  Cardiologist:   Dorris Carnes, MD       History of Present Illness: Erin [name] is a 51 y.o. female with a history of DM and HTN  I saw her in Feb   For first time  I recomm increasing amlodipine to 5 bid        No outpatient prescriptions have been marked as taking for the 06/29/16 encounter (Appointment) with Fay Records, MD.   Current Facility-Administered Medications for the 06/29/16 encounter (Appointment) with Fay Records, MD  Medication  . betamethasone acetate-betamethasone sodium phosphate (CELESTONE) injection 3 mg     Allergies:   Patient has no known allergies.   Past Medical History:  Diagnosis Date  . Anxiety   . Depression   . GERD (gastroesophageal reflux disease)   . Menorrhagia   . OSA on CPAP   . Type 2 diabetes mellitus ([place])   . Wears glasses     Past Surgical History:  Procedure Laterality Date  . DILITATION & CURRETTAGE/HYSTROSCOPY WITH NOVASURE ABLATION N/A 08/26/2015   Procedure: DILATATION & CURETTAGE/HYSTEROSCOPY WITH NOVASURE ABLATION;  Surgeon: [name], MD;  Location: [facility];  Service: Gynecology;  Laterality: N/A;  . EXCISIONAL LEFT BREAST BX  11-14-2007   benign  . LAPAROSCOPIC TUBAL LIGATION Bilateral 08/26/2015   Procedure: LAPAROSCOPIC TUBAL LIGATION;  Surgeon: [name], MD;  Location: [facility];  Service: Gynecology;  Laterality: Bilateral;  . THYROID LOBECTOMY Right 04-30-2008     Social History:  The patient  reports that she has quit smoking. Her smoking use included Cigarettes. She quit after 15.00 years of use. She has never used smokeless tobacco. She reports that she does not drink alcohol or use drugs.   Family History:  The patient's family history includes Diabetes in her mother; Stroke in her mother.    ROS:  Please see the history  of present illness. All other systems are reviewed and  Negative to the above problem except as noted.    PHYSICAL EXAM: VS:  There were no vitals taken for this visit.  GEN: Well nourished, well developed, in no acute distress  HEENT: normal  Neck: no JVD, carotid bruits, or masses Cardiac: RRR; no murmurs, rubs, or gallops,no edema  Respiratory:  clear to auscultation bilaterally, normal work of breathing GI: soft, nontender, nondistended, + BS  No hepatomegaly  MS: no deformity Moving all extremities   Skin: warm and dry, no rash Neuro:  Strength and sensation are intact Psych: euthymic mood, full affect   EKG:  EKG is ordered today.   Lipid Panel    Component Value Date/Time   CHOL 152 05/20/2006 1135   TRIG 63 05/20/2006 1135   HDL 37.4 (L) 05/20/2006 1135   CHOLHDL 4.1 CALC 05/20/2006 1135   VLDL 13 05/20/2006 1135   LDLCALC 102 (H) 05/20/2006 1135      Wt Readings from Last 3 Encounters:  06/01/16 205 lb 12.8 oz (93.4 kg)  05/27/16 206 lb (93.4 kg)  03/16/16 225 lb (102.1 kg)      ASSESSMENT AND PLAN:     Current medicines are reviewed at length with the patient today.  The patient does not have concerns regarding medicines.  Signed, Dorris Carnes, MD  06/28/2016 9:03 PM    [facility] [phone]  [address] [place], [place]  [address] Phone: [phone]; Fax: [phone]

## 2016-06-29 ENCOUNTER — Ambulatory Visit: Payer: 59 | Admitting: Internal Medicine

## 2016-07-10 MED FILL — OLMESARTAN-HCTZ 40-25 MG TA: 40-25 | 30 days supply | Qty: 30 | Fill #3

## 2016-07-10 MED FILL — CITALOPRAM HBR 20 MG TABLET: 20 | 30 days supply | Qty: 30 | Fill #4

## 2016-07-10 MED FILL — metFORMIN HCL 500 MG TABS: 500 | 30 days supply | Qty: 30 | Fill #1

## 2016-07-13 MED FILL — OMEPRAZOLE DR 40 MG CAPSULE: 40 | 30 days supply | Qty: 60 | Fill #0

## 2016-07-30 NOTE — Progress Notes (Deleted)
Cardiology Office Note   Date:  07/30/2016   ID:  Erin [name], DOB December 17, 1965, MRN [id number]  PCP:  Harvie Junior, MD  Cardiologist:   Dorris Carnes, MD       History of Present Illness: Erin [name] is a 51 y.o. female with a history of DM and HTN Also a Fhx of CAD   I saw her in Feb 2018 I recomm increasing to 5 bid  Was seen in HTN cliic in March  At that time was on 5 qd and Benicar/HCTZ  BP cood        No outpatient prescriptions have been marked as taking for the 07/31/16 encounter (Appointment) with Fay Records, MD.   Current Facility-Administered Medications for the 07/31/16 encounter (Appointment) with Fay Records, MD  Medication  . betamethasone acetate-betamethasone sodium phosphate (CELESTONE) injection 3 mg     Allergies:   Patient has no known allergies.   Past Medical History:  Diagnosis Date  . Anxiety   . Depression   . GERD (gastroesophageal reflux disease)   . Menorrhagia   . OSA on CPAP   . Type 2 diabetes mellitus ([place])   . Wears glasses     Past Surgical History:  Procedure Laterality Date  . DILITATION & CURRETTAGE/HYSTROSCOPY WITH NOVASURE ABLATION N/A 08/26/2015   Procedure: DILATATION & CURETTAGE/HYSTEROSCOPY WITH NOVASURE ABLATION;  Surgeon: [name], MD;  Location: [facility];  Service: Gynecology;  Laterality: N/A;  . EXCISIONAL LEFT BREAST BX  11-14-2007   benign  . LAPAROSCOPIC TUBAL LIGATION Bilateral 08/26/2015   Procedure: LAPAROSCOPIC TUBAL LIGATION;  Surgeon: [name], MD;  Location[facility];  Service: Gynecology;  Laterality: Bilateral;  . THYROID LOBECTOMY Right 04-30-2008     Social History:  The patient  reports that she has quit smoking. Her smoking use included Cigarettes. She quit after 15.00 years of use. She has never used smokeless tobacco. She reports that she does not drink alcohol or use drugs.   Family History:  The patient's family history includes  Diabetes in her mother; Stroke in her mother.    ROS:  Please see the history of present illness. All other systems are reviewed and  Negative to the above problem except as noted.    PHYSICAL EXAM: VS:  There were no vitals taken for this visit.  GEN: Well nourished, well developed, in no acute distress  HEENT: normal  Neck: no JVD, carotid bruits, or masses Cardiac: RRR; no murmurs, rubs, or gallops,no edema  Respiratory:  clear to auscultation bilaterally, normal work of breathing GI: soft, nontender, nondistended, + BS  No hepatomegaly  MS: no deformity Moving all extremities   Skin: warm and dry, no rash Neuro:  Strength and sensation are intact Psych: euthymic mood, full affect   EKG:  EKG is ordered today.   Lipid Panel    Component Value Date/Time   CHOL 152 05/20/2006 1135   TRIG 63 05/20/2006 1135   HDL 37.4 (L) 05/20/2006 1135   CHOLHDL 4.1 CALC 05/20/2006 1135   VLDL 13 05/20/2006 1135   LDLCALC 102 (H) 05/20/2006 1135      Wt Readings from Last 3 Encounters:  06/01/16 93.4 kg (205 lb 12.8 oz)  05/27/16 93.4 kg (206 lb)  03/16/16 102.1 kg (225 lb)      ASSESSMENT AND PLAN:     Current medicines are reviewed at length with the patient today.  The patient does not have  concerns regarding medicines.  Signed, Dorris Carnes, MD  07/30/2016 9:13 PM    [facility], [place], [place]  [address] Phone: [phone]; Fax: [phone]

## 2016-07-31 ENCOUNTER — Ambulatory Visit: Payer: 59 | Admitting: Internal Medicine

## 2016-08-20 MED FILL — CITALOPRAM HBR 20 MG TABLET: 20 | 30 days supply | Qty: 30 | Fill #5

## 2016-08-20 MED FILL — metFORMIN HCL 500 MG TABS: 500 | 30 days supply | Qty: 30 | Fill #2

## 2016-08-20 MED FILL — OLMESARTAN-HCTZ 40-25 MG TA: 40-25 | 30 days supply | Qty: 30 | Fill #4

## 2016-08-20 MED FILL — AMLODIPINE BESYLATE 5 MG TA: 5 | 90 days supply | Qty: 180 | Fill #1

## 2016-08-20 MED FILL — OMEPRAZOLE DR 40 MG CAPSULE: 40 | 30 days supply | Qty: 60 | Fill #0

## 2016-10-02 ENCOUNTER — Encounter: Payer: Self-pay | Admitting: Internal Medicine

## 2016-10-02 ENCOUNTER — Ambulatory Visit (INDEPENDENT_AMBULATORY_CARE_PROVIDER_SITE_OTHER): Payer: 59 | Admitting: Internal Medicine

## 2016-10-02 VITALS — BP 176/96 | HR 55 | Ht 63.75 in | Wt 209.2 lb

## 2016-10-02 DIAGNOSIS — I10 Essential (primary) hypertension: Secondary | ICD-10-CM

## 2016-10-02 MED ORDER — AMLODIPINE BESYLATE 10 MG PO TABS: 10.0000 mg | ORAL_TABLET | Freq: Every day | ORAL | 6 refills | Status: DC

## 2016-10-02 NOTE — Patient Instructions (Signed)
Your physician has recommended you make the following change in your medication:  1.) increase amlodipine to 10 mg daily  Your physician recommends that you schedule a follow-up appointment in: 4-6 weeks.

## 2016-10-02 NOTE — Progress Notes (Signed)
Cardiology Office Note   Date:  10/02/2016   ID:  Erin [name], DOB 1965/12/13, MRN [id number]  PCP:  Harvie Junior, MD  Cardiologist:   Dorris Carnes, MD   F/U of  HTN    History of Present Illness: Erin [name] is a 51 y.o. female with a history of DM and HTN   Pt als with strong FHx fo CAD    Since seen the pt says she is breathing OK  No CP Did not take her meds today  Forgot BP at home in 150 / range  Current Meds  Medication Sig  . amLODipine (NORVASC) 5 MG tablet Take 1 tablet (5 mg total) by mouth daily.  . citalopram (CELEXA) 20 MG tablet Take 20 mg by mouth every morning.  . olmesartan-hydrochlorothiazide (BENICAR HCT) 40-25 MG tablet Take 1 tablet by mouth daily.  [name] omeprazole (PRILOSEC) 40 MG capsule Take 40 mg by mouth every morning.   Current Facility-Administered Medications for the 10/02/16 encounter (Office Visit) with Fay Records, MD  Medication  . betamethasone acetate-betamethasone sodium phosphate (CELESTONE) injection 3 mg     Allergies:   Patient has no known allergies.   Past Medical History:  Diagnosis Date  . Anxiety   . Depression   . GERD (gastroesophageal reflux disease)   . Menorrhagia   . OSA on CPAP   . Type 2 diabetes mellitus ([place])   . Wears glasses     Past Surgical History:  Procedure Laterality Date  . DILITATION & CURRETTAGE/HYSTROSCOPY WITH NOVASURE ABLATION N/A 08/26/2015   Procedure: DILATATION & CURETTAGE/HYSTEROSCOPY WITH NOVASURE ABLATION;  Surgeon: [name], MD;  Location: [facility];  Service: Gynecology;  Laterality: N/A;  . EXCISIONAL LEFT BREAST BX  11-14-2007   benign  . LAPAROSCOPIC TUBAL LIGATION Bilateral 08/26/2015   Procedure: LAPAROSCOPIC TUBAL LIGATION;  Surgeon: [name], MD;  Location: [facility];  Service: Gynecology;  Laterality: Bilateral;  . THYROID LOBECTOMY Right 04-30-2008     Social History:  The patient  reports that she has quit smoking.  Her smoking use included Cigarettes. She quit after 15.00 years of use. She has never used smokeless tobacco. She reports that she does not drink alcohol or use drugs.   Family History:  The patient's family history includes Diabetes in her mother; Stroke in her mother.    ROS:  Please see the history of present illness. All other systems are reviewed and  Negative to the above problem except as noted.    PHYSICAL EXAM: VS:  BP (!) 176/96   Pulse (!) 55   Ht 5' 3.75" (1.619 m)   Wt 209 lb 3.2 oz (94.9 kg)   BMI 36.19 kg/m   GEN: Morbidly obese 51 yo in no acute distress  HEENT: normal  Neck: no JVD, carotid bruits, or masses Cardiac: RRR; no murmurs, rubs, or gallops,no edema  Respiratory:  clear to auscultation bilaterally, normal work of breathing GI: soft, nontender, nondistended, + BS  No hepatomegaly  MS: no deformity Moving all extremities   Skin: warm and dry, no rash Neuro:  Strength and sensation are intact Psych: euthymic mood, full affect   EKG:  EKG shows SB 55 bpm    Lipid Panel    Component Value Date/Time   CHOL 152 05/20/2006 1135   TRIG 63 05/20/2006 1135   HDL 37.4 (L) 05/20/2006 1135   CHOLHDL 4.1 CALC 05/20/2006 1135   VLDL 13 05/20/2006 1135  LDLCALC 102 (H) 05/20/2006 1135      Wt Readings from Last 3 Encounters:  10/02/16 209 lb 3.2 oz (94.9 kg)  06/01/16 205 lb 12.8 oz (93.4 kg)  05/27/16 206 lb (93.4 kg)      ASSESSMENT AND PLAN:  1  HTN   Increase amlodipine to 10 mg  Continue other meds  F/U in clinic in 4 to 6 wks       Current medicines are reviewed at length with the patient today.  The patient does not have concerns regarding medicines.  Signed, Dorris Carnes, MD  10/02/2016 4:41 PM    [facility] [place], [place], [place]  [address] Phone: [phone]; Fax: [phone]

## 2016-10-13 MED FILL — OLMESARTAN-HCTZ 40-25 MG TA: 40-25 | 30 days supply | Qty: 30 | Fill #5

## 2016-10-13 MED FILL — CITALOPRAM HBR 20 MG TABLET: 20 | 30 days supply | Qty: 30 | Fill #6

## 2016-10-13 MED FILL — OMEPRAZOLE DR 40 MG CAPSULE: 40 | 30 days supply | Qty: 60 | Fill #1

## 2016-10-13 MED FILL — metFORMIN HCL 500 MG TABS: 500 | 30 days supply | Qty: 30 | Fill #3

## 2016-11-12 ENCOUNTER — Ambulatory Visit: Payer: 59 | Admitting: Internal Medicine

## 2016-11-17 DIAGNOSIS — Z83511 Family history of glaucoma: Secondary | ICD-10-CM | POA: Diagnosis not present

## 2016-11-23 ENCOUNTER — Other Ambulatory Visit: Payer: Self-pay | Admitting: Physician Assistant

## 2016-11-23 DIAGNOSIS — E119 Type 2 diabetes mellitus without complications: Secondary | ICD-10-CM

## 2016-11-23 MED FILL — OMEPRAZOLE DR 40 MG CAPSULE: 40 | 30 days supply | Qty: 60 | Fill #2

## 2016-11-23 MED FILL — OLMESARTAN-HCTZ 40-25 MG TA: 40-25 | 30 days supply | Qty: 30 | Fill #6

## 2016-11-23 MED FILL — AMLODIPINE BESYLATE 10 MG T: 10 | 60 days supply | Qty: 60 | Fill #0

## 2016-11-23 MED FILL — CITALOPRAM HBR 20 MG TABLET: 20 | 30 days supply | Qty: 30 | Fill #0

## 2016-11-24 DIAGNOSIS — N760 Acute vaginitis: Secondary | ICD-10-CM | POA: Diagnosis not present

## 2016-11-24 MED FILL — CLOTRIMAZOLE-BETAMETHASONE: 1-0.05 | 7 days supply | Qty: 15 | Fill #0

## 2016-11-24 MED FILL — TINIDAZOLE 500 MG TABS: 500 | 1 days supply | Qty: 4 | Fill #0

## 2016-11-26 MED FILL — metFORMIN HCL 500 MG TABS: 500 | 30 days supply | Qty: 30 | Fill #0

## 2016-12-14 DIAGNOSIS — Z01419 Encounter for gynecological examination (general) (routine) without abnormal findings: Secondary | ICD-10-CM | POA: Diagnosis not present

## 2016-12-14 DIAGNOSIS — Z113 Encounter for screening for infections with a predominantly sexual mode of transmission: Secondary | ICD-10-CM | POA: Diagnosis not present

## 2016-12-14 DIAGNOSIS — Z114 Encounter for screening for human immunodeficiency virus [HIV]: Secondary | ICD-10-CM | POA: Diagnosis not present

## 2016-12-14 DIAGNOSIS — Z1159 Encounter for screening for other viral diseases: Secondary | ICD-10-CM | POA: Diagnosis not present

## 2016-12-14 DIAGNOSIS — Z1231 Encounter for screening mammogram for malignant neoplasm of breast: Secondary | ICD-10-CM | POA: Diagnosis not present

## 2016-12-14 DIAGNOSIS — Z6836 Body mass index (BMI) 36.0-36.9, adult: Secondary | ICD-10-CM | POA: Diagnosis not present

## 2016-12-15 ENCOUNTER — Other Ambulatory Visit: Payer: Self-pay | Admitting: Obstetrics and Gynecology

## 2016-12-15 DIAGNOSIS — R928 Other abnormal and inconclusive findings on diagnostic imaging of breast: Secondary | ICD-10-CM

## 2016-12-29 MED FILL — CITALOPRAM HBR 20 MG TABLET: 20 | 30 days supply | Qty: 30 | Fill #1

## 2016-12-29 MED FILL — OLMESARTAN-HCTZ 40-25 MG TA: 40-25 | 30 days supply | Qty: 30 | Fill #7

## 2016-12-30 ENCOUNTER — Other Ambulatory Visit: Payer: Self-pay | Admitting: Obstetrics and Gynecology

## 2016-12-30 ENCOUNTER — Ambulatory Visit
Admission: RE | Admit: 2016-12-30 | Discharge: 2016-12-30 | Disposition: A | Discharge status: Home / Self Care | Payer: 59 | Source: Ambulatory Visit | Attending: Obstetrics and Gynecology | Admitting: Obstetrics and Gynecology

## 2016-12-30 DIAGNOSIS — R928 Other abnormal and inconclusive findings on diagnostic imaging of breast: Secondary | ICD-10-CM

## 2016-12-30 DIAGNOSIS — Z09 Encounter for follow-up examination after completed treatment for conditions other than malignant neoplasm: Secondary | ICD-10-CM

## 2017-01-12 DIAGNOSIS — Z13820 Encounter for screening for osteoporosis: Secondary | ICD-10-CM | POA: Diagnosis not present

## 2017-02-08 ENCOUNTER — Other Ambulatory Visit: Payer: Self-pay | Admitting: Physician Assistant

## 2017-02-08 ENCOUNTER — Telehealth: Payer: Self-pay | Admitting: Pharmacist

## 2017-02-08 DIAGNOSIS — E119 Type 2 diabetes mellitus without complications: Secondary | ICD-10-CM

## 2017-02-08 MED ORDER — HYDROCHLOROTHIAZIDE 25 MG PO TABS: 25.0000 mg | ORAL_TABLET | Freq: Every day | ORAL | 1 refills | Status: DC

## 2017-02-08 MED ORDER — OLMESARTAN MEDOXOMIL 40 MG PO TABS: 40.0000 mg | ORAL_TABLET | Freq: Every day | ORAL | 1 refills | Status: DC

## 2017-02-08 MED FILL — OLMESARTAN MEDOXOMIL 40 MG: 40 | 90 days supply | Qty: 90 | Fill #0

## 2017-02-08 MED FILL — metFORMIN HCL 500 MG TABS: 500 | 30 days supply | Qty: 30 | Fill #0

## 2017-02-08 MED FILL — HYDROCHLOROTHIAZIDE 25 MG T: 25 | 90 days supply | Qty: 90 | Fill #0

## 2017-02-08 MED FILL — CITALOPRAM HBR 20 MG TABLET: 20 | 30 days supply | Qty: 30 | Fill #0

## 2017-02-08 MED FILL — OMEPRAZOLE DR 40 MG CAPSULE: 40 | 30 days supply | Qty: 60 | Fill #0

## 2017-02-08 NOTE — Telephone Encounter (Signed)
Received message from pharmacy that olmesartan/HCTZ tablet on back order.   Will send RX for individual components.   Patient aware of above.

## 2017-02-22 DIAGNOSIS — L74510 Primary focal hyperhidrosis, axilla: Secondary | ICD-10-CM | POA: Diagnosis not present

## 2017-02-22 MED FILL — GLYCOPYRROLATE 1 MG TABLET: 1 | 30 days supply | Qty: 60 | Fill #0

## 2017-03-25 MED FILL — CITALOPRAM HBR 20 MG TABLET: 20 | 30 days supply | Qty: 30 | Fill #1

## 2017-03-26 ENCOUNTER — Ambulatory Visit: Payer: 59 | Admitting: Internal Medicine

## 2017-04-09 ENCOUNTER — Encounter: Payer: Self-pay | Admitting: Internal Medicine

## 2017-04-09 ENCOUNTER — Ambulatory Visit (INDEPENDENT_AMBULATORY_CARE_PROVIDER_SITE_OTHER): Payer: 59 | Admitting: Internal Medicine

## 2017-04-09 VITALS — BP 158/84 | HR 79 | Ht 63.75 in | Wt 216.8 lb

## 2017-04-09 DIAGNOSIS — I10 Essential (primary) hypertension: Secondary | ICD-10-CM | POA: Diagnosis not present

## 2017-04-09 DIAGNOSIS — E782 Mixed hyperlipidemia: Secondary | ICD-10-CM

## 2017-04-09 NOTE — Patient Instructions (Signed)
Medication Instructions:  Your physician recommends that you continue on your current medications as directed. Please refer to the Current Medication list given to you today.   Labwork: Lab work to be done today--BMP, CBC, lipids  Testing/Procedures: none  Follow-Up: Your physician wants you to follow-up in: October or November You will receive a reminder letter in the mail two months in advance. If you don't receive a letter, please call our office to schedule the follow-up appointment.   Any Other Special Instructions Will Be Listed Below (If Applicable).     If you need a refill on your cardiac medications before your next appointment, please call your pharmacy.

## 2017-04-09 NOTE — Progress Notes (Signed)
Cardiology Office Note   Date:  04/09/2017   ID:  Erin [name], DOB 11-13-1965, MRN [id number]  PCP:  Harvie Junior, MD  Cardiologist:   Dorris Carnes, MD   F/U of  HTN    History of Present Illness: Erin [name] is a 52 y.o. female with a history of DM and HTN   Pt also with strong FHx fo CAD   I saw her in Aug 2018 Since seen denies CP  Breathing is OK    She is working a lot  Has lost some weight    Did not take meds today   Current Meds  Medication Sig  . amLODipine (NORVASC) 10 MG tablet Take 1 tablet (10 mg total) by mouth daily.  . citalopram (CELEXA) 20 MG tablet Take 20 mg by mouth every morning.  . hydrochlorothiazide (HYDRODIURIL) 25 MG tablet Take 1 tablet (25 mg total) by mouth daily.  . metFORMIN (GLUCOPHAGE) 500 MG tablet Take 1 tablet (500 mg total) by mouth daily with breakfast. Office visit needed for refills  . olmesartan (BENICAR) 40 MG tablet Take 1 tablet (40 mg total) by mouth daily.  [name] omeprazole (PRILOSEC) 40 MG capsule Take 40 mg by mouth every morning.   Current Facility-Administered Medications for the 04/09/17 encounter (Office Visit) with Fay Records, MD  Medication  . betamethasone acetate-betamethasone sodium phosphate (CELESTONE) injection 3 mg     Allergies:   Patient has no known allergies.   Past Medical History:  Diagnosis Date  . Anxiety   . Depression   . GERD (gastroesophageal reflux disease)   . Menorrhagia   . OSA on CPAP   . Type 2 diabetes mellitus ([place])   . Wears glasses     Past Surgical History:  Procedure Laterality Date  . DILITATION & CURRETTAGE/HYSTROSCOPY WITH NOVASURE ABLATION N/A 08/26/2015   Procedure: DILATATION & CURETTAGE/HYSTEROSCOPY WITH NOVASURE ABLATION;  Surgeon: [name], MD;  Location: [place];  Service: Gynecology;  Laterality: N/A;  . EXCISIONAL LEFT BREAST BX  11-14-2007   benign  . LAPAROSCOPIC TUBAL LIGATION Bilateral 08/26/2015   Procedure: LAPAROSCOPIC  TUBAL LIGATION;  Surgeon: [name], MD;  Location[facility];  Service: Gynecology;  Laterality: Bilateral;  . THYROID LOBECTOMY Right 04-30-2008     Social History:  The patient  reports that she has quit smoking. Her smoking use included cigarettes. She quit after 15.00 years of use. she has never used smokeless tobacco. She reports that she does not drink alcohol or use drugs.   Family History:  The patient's family history includes Diabetes in her mother; Stroke in her mother.    ROS:  Please see the history of present illness. All other systems are reviewed and  Negative to the above problem except as noted.    PHYSICAL EXAM: VS:  BP (!) 158/84   Pulse 79   Ht 5' 3.75" (1.619 m)   Wt 216 lb 12.8 oz (98.3 kg)   SpO2 95%   BMI 37.51 kg/m   GEN: Morbidly obese 52 yo in no acute distress  HEENT: normal  Neck: no JVD, carotid bruits, or masses Cardiac: RRR; no murmurs, rubs, or gallops,no edema  Respiratory:  clear to auscultation bilaterally, normal work of breathing GI: soft, nontender, nondistended, + BS  No hepatomegaly  MS: no deformity Moving all extremities   Skin: warm and dry, no rash Neuro:  Strength and sensation are intact Psych: euthymic mood, full affect  EKG:  EKG not done today   Lipid Panel    Component Value Date/Time   CHOL 152 05/20/2006 1135   TRIG 63 05/20/2006 1135   HDL 37.4 (L) 05/20/2006 1135   CHOLHDL 4.1 CALC 05/20/2006 1135   VLDL 13 05/20/2006 1135   LDLCALC 102 (H) 05/20/2006 1135      Wt Readings from Last 3 Encounters:  04/09/17 216 lb 12.8 oz (98.3 kg)  10/02/16 209 lb 3.2 oz (94.9 kg)  06/01/16 205 lb 12.8 oz (93.4 kg)      ASSESSMENT AND PLAN:  1  HTN  Pt did not take meds  BP is up today  Says runs better   Will check BMET  2  HL  Will get lipids today    3  Morbid obesity  Encouraged her to continue to lose wt.  Stay aactive   Will f/u in fall       Current medicines are reviewed at length  with the patient today.  The patient does not have concerns regarding medicines.  Signed, Dorris Carnes, MD  04/09/2017 4:42 PM    [address], [place], [place]  [address] Phone: [phone]; Fax: [phone]

## 2017-04-10 LAB — CBC
HEMATOCRIT: 36.1 % (ref 34.0–46.6)
Hemoglobin: 12.2 g/dL (ref 11.1–15.9)
MCH: 31.4 pg (ref 26.6–33.0)
MCHC: 33.8 g/dL (ref 31.5–35.7)
MCV: 93 fL (ref 79–97)
Platelets: 240 10*3/uL (ref 150–379)
RBC: 3.88 x10E6/uL (ref 3.77–5.28)
RDW: 14.9 % (ref 12.3–15.4)
WBC: 6.4 10*3/uL (ref 3.4–10.8)

## 2017-04-10 LAB — BASIC METABOLIC PANEL
BUN / CREAT RATIO: 16 (ref 9–23)
BUN: 14 mg/dL (ref 6–24)
CO2: 23 mmol/L (ref 20–29)
Calcium: 9.5 mg/dL (ref 8.7–10.2)
Chloride: 105 mmol/L (ref 96–106)
Creatinine, Ser: 0.85 mg/dL (ref 0.57–1.00)
GFR calc Af Amer: 92 mL/min/{1.73_m2} (ref >59)
GFR, EST NON AFRICAN AMERICAN: 80 mL/min/{1.73_m2} (ref >59)
Glucose: 82 mg/dL (ref 65–99)
Potassium: 3.5 mmol/L (ref 3.5–5.2)
Sodium: 140 mmol/L (ref 134–144)

## 2017-04-10 LAB — LIPID PANEL
Chol/HDL Ratio: 3.4 ratio (ref 0.0–4.4)
Cholesterol, Total: 168 mg/dL (ref 100–199)
HDL: 49 mg/dL (ref >39)
LDL Calculated: 107 mg/dL — ABNORMAL HIGH (ref 0–99)
Triglycerides: 58 mg/dL (ref 0–149)
VLDL Cholesterol Cal: 12 mg/dL (ref 5–40)

## 2017-04-16 ENCOUNTER — Other Ambulatory Visit: Payer: Self-pay | Admitting: *Deleted

## 2017-04-16 DIAGNOSIS — E782 Mixed hyperlipidemia: Secondary | ICD-10-CM

## 2017-04-16 MED ORDER — ROSUVASTATIN CALCIUM 10 MG PO TABS: 10.0000 mg | ORAL_TABLET | Freq: Every day | ORAL | 3 refills | Status: DC

## 2017-04-16 MED FILL — ROSUVASTATIN CALCIUM 10 MG: 10 | 90 days supply | Qty: 90 | Fill #0

## 2017-04-17 ENCOUNTER — Ambulatory Visit (INDEPENDENT_AMBULATORY_CARE_PROVIDER_SITE_OTHER): Payer: 59

## 2017-04-17 ENCOUNTER — Other Ambulatory Visit: Payer: Self-pay

## 2017-04-17 ENCOUNTER — Ambulatory Visit: Payer: 59 | Admitting: Physician Assistant

## 2017-04-17 ENCOUNTER — Encounter: Payer: Self-pay | Admitting: Physician Assistant

## 2017-04-17 VITALS — BP 176/94 | HR 87 | Temp 98.1°F | Resp 16 | Ht 64.0 in | Wt 204.6 lb

## 2017-04-17 DIAGNOSIS — M545 Low back pain, unspecified: Secondary | ICD-10-CM

## 2017-04-17 DIAGNOSIS — R030 Elevated blood-pressure reading, without diagnosis of hypertension: Secondary | ICD-10-CM | POA: Diagnosis not present

## 2017-04-17 DIAGNOSIS — E278 Other specified disorders of adrenal gland: Secondary | ICD-10-CM | POA: Insufficient documentation

## 2017-04-17 DIAGNOSIS — M51369 Other intervertebral disc degeneration, lumbar region without mention of lumbar back pain or lower extremity pain: Secondary | ICD-10-CM | POA: Insufficient documentation

## 2017-04-17 DIAGNOSIS — M4310 Spondylolisthesis, site unspecified: Secondary | ICD-10-CM | POA: Insufficient documentation

## 2017-04-17 DIAGNOSIS — M5136 Other intervertebral disc degeneration, lumbar region: Secondary | ICD-10-CM

## 2017-04-17 LAB — POCT URINALYSIS DIP (MANUAL ENTRY)
Glucose, UA: NEGATIVE mg/dL
Nitrite, UA: NEGATIVE
Protein Ur, POC: 100 mg/dL — AB
Spec Grav, UA: 1.015 (ref 1.010–1.025)
Urobilinogen, UA: 1 E.U./dL
pH, UA: 6 (ref 5.0–8.0)

## 2017-04-17 MED ORDER — CYCLOBENZAPRINE HCL 10 MG PO TABS: 10.0000 mg | ORAL_TABLET | Freq: Three times a day (TID) | ORAL | 0 refills | Status: DC | PRN

## 2017-04-17 NOTE — Patient Instructions (Addendum)
1) Your x-ray shows worsening degenerative changes of your spine. I am concerned about other diseases that may cause several of your symptoms including: high blood pressure, bone changes, atherosclerosis, and the adrenal mass. We need to investigate this further. You will receive a phone call to schedule an appointment with endocrinology.   2) If you are not improving in 3-4 weeks I can refer you to orthopedics.  Start stretches below. Perform exercises below at 5 sets with 10 repetitions. Stretches are to be performed for 5 sets, 10 seconds each. Do this twice daily within pain tolerance for 2 weeks.  3) Your blood pressure is way too high. Please call your cardiologist for increased dose of your medication.   Spondylolisthesis Rehab Ask your health care provider which exercises are safe for you. Do exercises exactly as told by your health care provider and adjust them as directed. It is normal to feel mild stretching, pulling, tightness, or discomfort as you do these exercises, but you should stop right away if you feel sudden pain or your pain gets worse. Do not begin these exercises until told by your health care provider. Stretching and range of motion exercises These exercises warm up your muscles and joints and improve the movement and flexibility of your hips and your back. These exercises may also help to relieve pain, numbness, and tingling. Exercise A: Single knee to chest  1. Lie on your back on a firm surface with both legs straight. 2. Bend one of your knees. Use your hands to move your knee up toward your chest until you feel a gentle stretch in your lower back and buttock. ? Hold your leg in this position by holding onto the front of your knee. ? Keep your other leg as straight as possible. 3. Hold for __________ seconds. 4. Slowly return to the starting position. 5. Repeat this exercise with your other leg. Repeat __________ times. Complete this exercise __________ times a  day. Exercise B: Double knee to chest  1. Lie on your back on a firm surface with both legs straight. 2. Bend one of your knees and move it toward your chest until you feel a gentle stretch in your lower back and buttock. 3. Tense your abdominal muscles and repeat the previous step with your other leg. 4. Hold both of your legs in this position by holding onto the backs of your thighs or the fronts of your knees. 5. Hold for __________ seconds. 6. Tense your abdominal muscles and slowly move your legs back to the floor, one leg at a time. Repeat __________ times. Complete this exercise __________ times a day. Strengthening exercises These exercises build strength and endurance in your back. Endurance is the ability to use your muscles for a long time, even after they get tired. Exercise C: Pelvic tilt 1. Lie on your back on a firm bed or the floor. Bend your knees and keep your feet flat. 2. Tense your abdominal muscles. Tip your pelvis up toward the ceiling and flatten your lower back into the floor. ? To help with this exercise, you may place a small towel under your lower back and try to push your back into the towel. 3. Hold for __________ seconds. 4. Let your muscles relax completely before you repeat this exercise. Repeat __________ times. Complete this exercise __________ times a day. Exercise D: Abdominal crunch  1. Lie on your back on a firm surface. Bend your knees and keep your feet flat. Cross your arms  over your chest. 2. Tuck your chin down toward your chest, without bending your neck. 3. Use your abdominal muscles to lift your upper body off of the ground, straight up into the air. ? Try to lift yourself until your shoulder blades are off the ground. You may need to work up to this. ? Keep your lower back on the ground while you crunch upward. ? Do not hold your breath. 4. Slowly lower yourself down. Keep your abdominal muscles tense until you are back to the starting  position. Repeat __________ times. Complete this exercise __________ times a day. Exercise E: Alternating arm and leg raises  1. Get on your hands and knees on a firm surface. If you are on a hard floor, you may want to use padding to cushion your knees, such as an exercise mat. 2. Line up your arms and legs. Your hands should be below your shoulders, and your knees should be below your hips. 3. Lift your left leg behind you. At the same time, raise your right arm and straighten it in front of you. ? Do not lift your leg higher than your hip. ? Do not lift your arm higher than your shoulder. ? Keep your abdominal and back muscles tight. ? Keep your hips facing the ground. ? Do not arch your back. ? Keep your balance carefully, and do not hold your breath. 4. Hold for __________ seconds. 5. Slowly return to the starting position and repeat with your right leg and your left arm. Repeat __________ times. Complete this exercise __________ times a day. Posture and body mechanics  Body mechanics refers to the movements and positions of your body while you do your daily activities. Posture is part of body mechanics. Good posture and healthy body mechanics can help to relieve stress in your body's tissues and joints. Good posture means that your spine is in its natural S-curve position (your spine is neutral), your shoulders are pulled back slightly, and your head is not tipped forward. The following are general guidelines for applying improved posture and body mechanics to your everyday activities. Standing   When standing, keep your spine neutral and your feet about hip-width apart. Keep a slight bend in your knees. Your ears, shoulders, and hips should line up.  When you do a task in which you stand in one place for a long time, place one foot up on a stable object that is 2-4 inches (5-10 cm) high, such as a footstool. This helps keep your spine neutral. Sitting   When sitting, keep your  spine neutral and keep your feet flat on the floor. Use a footrest, if necessary, and keep your thighs parallel to the floor. Avoid rounding your shoulders, and avoid tilting your head forward.  When working at a desk or a computer, keep your desk at a height where your hands are slightly lower than your elbows. Slide your chair under your desk so you are close enough to maintain good posture.  When working at a computer, place your monitor at a height where you are looking straight ahead and you do not have to tilt your head forward or downward to look at the screen. Resting  When lying down and resting, avoid positions that are most painful for you.  If you have pain with activities such as sitting, bending, stooping, or squatting (flexion-based activities), lie in a position in which your body does not bend very much. For example, avoid curling up on your side  with your arms and knees near your chest (fetal position).  If you have pain with activities such as standing for a long time or reaching with your arms (extension-based activities), lie with your spine in a neutral position and bend your knees slightly. Try the following positions: ? Lying on your side with a pillow between your knees. ? Lying on your back with a pillow under your knees.  Lifting   When lifting objects, keep your feet at least shoulder-width apart and tighten your abdominal muscles.  Bend your knees and hips and keep your spine neutral. It is important to lift using the strength of your legs, not your back. Do not lock your knees straight out.  Always ask for help to lift heavy or awkward objects. This information is not intended to replace advice given to you by your health care provider. Make sure you discuss any questions you have with your health care provider. Document Released: 01/26/2005 Document Revised: 10/03/2015 Document Reviewed: 11/06/2014 Elsevier Interactive Patient Education  [employer].  Thank you for coming in today. I hope you feel we met your needs.  Feel free to call PCP if you have any questions or further requests.  Please consider signing up for MyChart if you do not already have it, as this is a great way to communicate with me.  Best,  Whitney McVey, PA-C  IF you received an x-ray today, you will receive an invoice from [facility]. Please contact [facility] at [phone] with questions or concerns regarding your invoice.   IF you received labwork today, you will receive an invoice from [facility]. Please contact [facility] at [phone] with questions or concerns regarding your invoice.   Our billing staff will not be able to assist you with questions regarding bills from these companies.  You will be contacted with the lab results as soon as they are available. The fastest way to get your results is to activate your My Chart account. Instructions are located on the last page of this paperwork. If you have not heard from [place] regarding the results in 2 weeks, please contact this office.

## 2017-04-17 NOTE — Progress Notes (Signed)
Erin [name]  MRN: [id number] DOB: April 11, 1965  PCP: Harvie Junior, MD  Subjective:  Pt is a 52 year old female PMH HTN, DM, obesity who presents to clinic for low back pain x 2 months.   Pain is of her low back. 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 She does not stand on support pad. She is not stretching. Pain is of her low back both sides. Does not radiate. Denies urinary symptoms, loss of bowel or bladder function, abdominal pain.   HTN - today's blood pressure is 185/105, recheck is 176/94. Norvasc 10mg , HCTZ 25mg , Benicar 40mg . She admits she is not taking medications like she was advised.  She is followed by cardiology, Dr. Harrington Challenger. Last OV 04/09/2017. F/u appt next month.   strong FHx fo CAD - started on crestor 10 mg at her last OV.  Denies HA, chest pain, palpitations, visual changes, abdominal pain.     Review of Systems  Constitutional: Negative for chills, diaphoresis, fatigue and fever.  Respiratory: Negative for cough, chest tightness, shortness of breath and wheezing.   Cardiovascular: Negative for chest pain and palpitations.  Musculoskeletal: Positive for back pain. Negative for gait problem, neck pain and neck stiffness.    Patient Active Problem List   Diagnosis Date Noted  . Class 2 obesity with body mass index (BMI) of 35.0 to 35.9 in adult 06/01/2016  . Diabetes ([place]) 05/27/2016  . HTN (hypertension) 04/13/2016  . Menorrhagia 08/26/2015    Class: Present on Admission    Current Outpatient Medications on File Prior to Visit  Medication Sig Dispense Refill  . amLODipine (NORVASC) 10 MG tablet Take 1 tablet (10 mg total) by mouth daily. 60 tablet 6  . citalopram (CELEXA) 20 MG tablet Take 20 mg by mouth every morning.    . hydrochlorothiazide (HYDRODIURIL) 25 MG tablet Take 1 tablet (25 mg total) by mouth daily. 90 tablet 1  . olmesartan (BENICAR) 40 MG tablet Take 1 tablet (40 mg total) by mouth daily. 90 tablet 1  .  omeprazole (PRILOSEC) 40 MG capsule Take 40 mg by mouth every morning.    . metFORMIN (GLUCOPHAGE) 500 MG tablet Take 1 tablet (500 mg total) by mouth daily with breakfast. Office visit needed for refills 30 tablet 0  . rosuvastatin (CRESTOR) 10 MG tablet Take 1 tablet (10 mg total) by mouth daily. (Patient not taking: Reported on 04/17/2017) 90 tablet 3   Current Facility-Administered Medications on File Prior to Visit  Medication Dose Route Frequency Provider Last Rate Last Dose  . betamethasone acetate-betamethasone sodium phosphate (CELESTONE) injection 3 mg  3 mg Intramuscular Once Daylene Katayama M, DPM        No Known Allergies   Objective:  BP (!) 185/105   Pulse 87   Temp 98.1 F (36.7 C) (Oral)   Resp 16   Ht 5\' 4"  (1.626 m)   Wt 204 lb 9.6 oz (92.8 kg)   SpO2 99%   BMI 35.12 kg/m   Physical Exam  Constitutional: She is oriented to person, place, and time and well-developed, well-nourished, and in no distress. No distress.  Cardiovascular: Normal rate, regular rhythm and normal heart sounds.  Musculoskeletal:       Lumbar back: She exhibits tenderness and bony tenderness. She exhibits normal range of motion, no deformity and no spasm.  Neurological: She is alert and oriented to person, place, and time. GCS score is 15.  Skin:  Skin is warm and dry.  Psychiatric: Mood, memory, affect and judgment normal.  Vitals reviewed.   06/02/2016 CT ABDOMEN AND PELVIS WITH CONTRAST IMPRESSION: 1. Mild soft tissue inflammation suggested about the bladder. Would correlate clinically for any evidence of cystitis. 2. Small periumbilical hernia is noted, containing fat and trace fluid, with mild surrounding soft tissue inflammation. 3. Mild coronary artery calcifications seen. 4. 3.1 cm left adrenal adenoma incidentally noted. 5. Tiny right renal cyst. 6. Scattered aortic atherosclerosis. 7. Mild degenerative change at the lower lumbar spine.  Dg Lumbar Spine Complete  Result Date:  04/17/2017 CLINICAL DATA:  Low back pain. EXAM: LUMBAR SPINE - COMPLETE 4+ VIEW COMPARISON:  CT scan of the abdomen and pelvis June 02, 2016 FINDINGS: Minimal grade 1 anterolisthesis of L4 versus L5. No other malalignment. No fractures. Minimal degenerative disc disease with tiny anterior osteophytes. Lower lumbar facet degenerative changes. IMPRESSION: Minimal degenerative disc disease. Lower lumbar facet degenerative changes. Electronically Signed   By: Dorise Bullion III M.D   On: 04/17/2017 10:33    Assessment and Plan :  1. Midline low back pain without sciatica, unspecified chronicity - DG Lumbar Spine Complete - POCT urinalysis dipstick - cyclobenzaprine (FLEXERIL) 10 MG tablet; Take 1 tablet (10 mg total) by mouth 3 (three) times daily as needed for muscle spasms.  Dispense: 30 tablet; Refill: 0 - Pt c/o lowe back pain x 2 months. Advised proper footwear, stretching and hydration. Lumbar x-ray shows minimal anterolisthesis. Consider ortho referral if no improvement.  2. Elevated blood pressure reading 3. 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[place]) 4. Anterolisthesis 5. Degenerative disc disease, lumbar - Ambulatory referral to Endocrinology - pt has a constellation of symptoms concerning for possible Cushing's. Incidental adenoma found on CT abdomen last year. Plan to have endocrine determine whether further imaging is needed. May consider surgery referral in the future. Advised pt to start taking blood pressure medications as directed by Dr. Harrington Challenger. Contact cards office and advise them of today's bp reading. Encouraged DASH diet.    Mercer Pod, PA-C  [facility] 04/17/2017 9:58 AM

## 2017-04-19 ENCOUNTER — Telehealth: Payer: Self-pay | Admitting: Cardiology

## 2017-04-19 ENCOUNTER — Ambulatory Visit (INDEPENDENT_AMBULATORY_CARE_PROVIDER_SITE_OTHER): Payer: 59 | Admitting: *Deleted

## 2017-04-19 VITALS — BP 170/90 | HR 72 | Resp 16

## 2017-04-19 DIAGNOSIS — I10 Essential (primary) hypertension: Secondary | ICD-10-CM

## 2017-04-19 MED ORDER — CARVEDILOL 6.25 MG PO TABS: 6.2500 mg | ORAL_TABLET | Freq: Two times a day (BID) | ORAL | 6 refills | Status: DC

## 2017-04-19 MED ORDER — OLMESARTAN MEDOXOMIL 40 MG PO TABS: 40.0000 mg | ORAL_TABLET | Freq: Every day | ORAL | Status: DC

## 2017-04-19 MED ORDER — OLMESARTAN MEDOXOMIL 40 MG PO TABS: 80.0000 mg | ORAL_TABLET | Freq: Every day | ORAL | 6 refills | Status: DC

## 2017-04-19 NOTE — Progress Notes (Signed)
1.) Reason for visit: blood pressure check  2.) Name of MD requesting visit: Pt walked into office to request a blood pressure check and talk to a nurse.  States she tried to contact office to discuss but was unable to get through.  3.) H&P: Seen 04/09/17 BP was elevated.  Pt had not taken BP med prior to that visit. Over weekend BP 185/101 at urgent care being seen for back pain.  Pt using auto wrist cuff at home reading today 219/105.  4.) ROS related to problem: Pt has no headache, dizziness, reports ongoing back pain, no other symptoms today  5.) Assessment and plan per MD: reviewed with Dr. Meda Coffee, DOD who recommends pt increase Benicar from 40 mg to 80 mg daily.  I scheduled follow up on 05/12/17 in HTN clinic with PharmD.

## 2017-04-19 NOTE — Telephone Encounter (Signed)
Pharmacist from [facility] is calling to inform Dr Erin [name] that Benicar does not come in 80 mg, and the max dose is 40 mg.   Pharmacist states that if she gives the pt Benicar 40 mg po bid, insurance will not cover this, it will not be beneficial to the pts BP issues, and its over the max dose.  Pharmacist would like for Dr Erin [name] to advise on a different regimen for the pts HTN issues.  Informed [name] that I will route this message to Dr Erin [name] to review and advise on and follow-up with her shortly thereafter. Pharmacist verbalized understanding and agrees with this plan.    Dr. Meda [name], refer below to Nurse Visit from today:  BP  170/90 Abnormal   (BP Location: Right Arm, Patient Position: Sitting, Cuff Size: Normal)   Pulse  72   Resp  16      Progress Notes   Erin Key, RN at 04/19/2017 11:00 AM   Status: Signed    1. Reason for visit: blood pressure check  2. Name of MD requesting visit: Pt walked into office to request a blood pressure check and talk to a nurse.  States she tried to contact office to discuss but was unable to get through.  3. H&P: Seen 04/09/17 BP was elevated.  Pt had not taken BP med prior to that visit. Over weekend BP 185/101 at urgent care being seen for back pain.  Pt using auto wrist cuff at home reading today 219/105.  4. ROS related to problem: Pt has no headache, dizziness, reports ongoing back pain, no other symptoms today  5. Assessment and plan per MD: reviewed with Dr. Meda [name], DOD who recommends pt increase Benicar from 40 mg to 80 mg daily.  I scheduled follow up on 05/12/17 in HTN clinic with PharmD.

## 2017-04-19 NOTE — Telephone Encounter (Signed)
Please start he on carvedilol 6.25 mg po BID instead.

## 2017-04-19 NOTE — Telephone Encounter (Signed)
I have changed olmesartan back to 40 mg once daily. Sent new order for carvedilol 6.25 mg by mouth twice a day to pharmacy. Called patient to inform. She already took a second pill of olmesartan today (to make 80 mg).  Will start back to 40 mg and add coreg tomorrow.   She will check her BP daily at work, few hours after taking medicines and after resting for at least 5 min.  Will keep a list and bring to appointment at HTN clinic.

## 2017-04-19 NOTE — Patient Instructions (Signed)
Your physician has recommended you make the following change in your medication:  1.) increase olmesartan to 80 mg (2 tablets) daily  Your physician recommends that you keep your follow up appointment with Hypertension Clinic on Wed May 12, 2017 at 11:30 am

## 2017-04-19 NOTE — Telephone Encounter (Signed)
Barista (Pharmacist at [facility]) is calling to clarity on the dosage amount of the Benicar . Please call at [phone]( direct line )

## 2017-04-20 ENCOUNTER — Encounter: Payer: Self-pay | Admitting: Physician Assistant

## 2017-04-20 ENCOUNTER — Ambulatory Visit: Payer: 59 | Admitting: Physician Assistant

## 2017-04-20 ENCOUNTER — Other Ambulatory Visit: Payer: Self-pay

## 2017-04-20 ENCOUNTER — Ambulatory Visit: Payer: Self-pay | Admitting: *Deleted

## 2017-04-20 VITALS — BP 147/81 | HR 90 | Temp 99.4°F | Resp 17 | Ht 64.0 in | Wt 208.6 lb

## 2017-04-20 DIAGNOSIS — I10 Essential (primary) hypertension: Secondary | ICD-10-CM | POA: Diagnosis not present

## 2017-04-20 MED FILL — CARVEDILOL 6.25 MG TABLET: 6.25 | 30 days supply | Qty: 60 | Fill #0

## 2017-04-20 NOTE — Telephone Encounter (Signed)
Pt called with complaints of high blood pressure; last night 04/19/17 at 2200 her blood pressure was 195/103; she used left wrist cuff; the pt states that she did not recheck her blood pressure this morning; nor has she picked up or started the the (Carvedilol 6.25 mg by mouth twice daily); see telephone encounter/ advise dated 04/19/17 pt was insistent on making an appointment to be seen in the office today by [facility]; explained to pt the importance of taking her medication as prescribed so that the physician can get a true assessment of her blood pressure;  Pt offered and accepted appointment today at 1200 with [place], [place], at [place]; Whitney McVey has no availability todaypt also instructed to take all of her blood pressure medications as ordered prior to today's appointment; she verbalizes understanding  Reason for Disposition . [7] Systolic BP  >= 989 OR Diastolic >= 211 AND [9] missed most recent dose of blood pressure medication  Answer Assessment - Initial Assessment Questions 1. BLOOD PRESSURE: "What is the blood pressure?" "Did you take at least two measurements 5 minutes apart?"     195/103 2. ONSET: "When did you take your blood pressure?"     04/19/17 at 2200 3. HOW: "How did you obtain the blood pressure?" (e.g., visiting nurse, automatic home BP monitor)     Automatic wrist cuff on left 4. HISTORY: "Do you have a history of high blood pressure?"     yes 5. MEDICATIONS: "Are you taking any medications for blood pressure?" "Have you missed any doses recently?"     No; has not started new medication per cardiology 6. OTHER SYMPTOMS: "Do you have any symptoms?" (e.g., headache, chest pain, blurred vision, difficulty breathing, weakness)     headache 7. PREGNANCY: "Is there any chance you are pregnant?" "When was your last menstrual period?"     No ablation 2017  Protocols used: HIGH BLOOD PRESSURE-A-AH

## 2017-04-20 NOTE — Progress Notes (Signed)
MRN: [id number] DOB: [birth year]  Subjective:   Erin [name] is a 52 y.o. female presenting for follow up on elevated bp readings.Pt last seen here on 04/17/17 for low back pain.  BP was elevated at 176/94. Has had dx of HTN for ~24 years. She is followed by cardiology for HTN. Went to cardiologist office yesterday and was seen by nurse. BP was 185/105. Increased norvasc to 10mg  and  benicar to 80mg . They also added carvedilol 6.25mg  BID. Started it this morning and then decreased benicar back to 40mg  along with norvasc 10mg , and hctz 25mg  daily. Sees cardiology again in 05/2017. Today, her back pain has improved. Her bp is doing better. She denies lightheadedness, dizziness, chronic headache, double vision, chest pain, shortness of breath, heart racing, palpitations, nausea, vomiting, abdominal pain, hematuria, lower leg swelling. Does not exercise. Works at [employer], tries to eat healhty but does like salty foods.  LIkes hams and bacon. Adds salt to most foods.  Denies smoking, no alcohol use. FH of HTN in mother.      Erin [name] has a current medication list which includes the following prescription(s): amlodipine, carvedilol, citalopram, cyclobenzaprine, hydrochlorothiazide, olmesartan, omeprazole, rosuvastatin, and metformin, and the following Facility-Administered Medications: betamethasone acetate-betamethasone sodium phosphate. Also has No Known Allergies.  Erin [name]  has a past medical history of Anxiety, Depression, GERD (gastroesophageal reflux disease), Menorrhagia, OSA on CPAP, Type 2 diabetes mellitus ([place]), and Wears glasses. Also  has a past surgical history that includes Thyroid lobectomy (Right, 04-30-2008); EXCISIONAL LEFT BREAST BX (11-14-2007); Dilatation & currettage/hysteroscopy with novasure ablation (N/A, 08/26/2015); and Laparoscopic tubal ligation (Bilateral, 08/26/2015).   Objective:   Vitals: BP (!) 147/81 (BP Location: Right Arm, Patient Position: Sitting, Cuff  Size: Large)   Pulse 90   Temp 99.4 F (37.4 C) (Oral)   Resp 17   Ht 5\' 4"  (1.626 m)   Wt 208 lb 9.6 oz (94.6 kg)   SpO2 97%   BMI 35.81 kg/m   Physical Exam  Constitutional: She is oriented to person, place, and time. She appears well-developed and well-nourished.  HENT:  Head: Normocephalic and atraumatic.  Eyes: Conjunctivae and EOM are normal. Pupils are equal, round, and reactive to light.  Neck: Normal range of motion.  Cardiovascular: Normal rate, regular rhythm, normal heart sounds and intact distal pulses.  Pulmonary/Chest: Effort normal and breath sounds normal. She has no wheezes. She has no rhonchi. She has no rales.  Musculoskeletal:       Right lower leg: She exhibits no swelling.       Left lower leg: She exhibits no swelling.  Neurological: She is alert and oriented to person, place, and time.  Skin: Skin is warm and dry.  Psychiatric: She has a normal mood and affect.  Vitals reviewed.   No results found for this or any previous visit (from the past 24 hour(s)).   BP Readings from Last 3 Encounters:  04/20/17 (!) 147/81  04/19/17 (!) 170/90  04/17/17 (!) 176/94     Assessment and Plan :  1. Essential hypertension Improving. She is asymptomatic. Back pain is improved, which is likely contributing to lower bp. Recommend contining with current medication regimen. Educated on lifestyle modifications such as decreasing salt and increasing exercise.  Instructed to check bp outside of office over the next week. Follow up in one week for reevaluation. Given strict ED precautions.   Tenna Delaine, PA-C  [facility] 04/20/2017 12:46 PM

## 2017-04-20 NOTE — Patient Instructions (Addendum)
Follow up in one week for reevaluation.  I would like you to check your blood pressure at least a couple times over the next week outside of the office and document these values. It is best if you check the blood pressure at different times in the day. Your goal is <140/90. If you start to have chest pain, blurred vision, shortness of breath, severe headache, lower leg swelling, or nausea/vomiting please seek care immediately here or at the ED.    Managing Your Hypertension Hypertension is commonly called high blood pressure. This is when the force of your blood pressing against the walls of your arteries is too strong. Arteries are blood vessels that carry blood from your heart throughout your body. Hypertension forces the heart to work harder to pump blood, and may cause the arteries to become narrow or stiff. Having untreated or uncontrolled hypertension can cause heart attack, stroke, kidney disease, and other problems. What are blood pressure readings? A blood pressure reading consists of a higher number over a lower number. Ideally, your blood pressure should be below 120/80. The first ("top") number is called the systolic pressure. It is a measure of the pressure in your arteries as your heart beats. The second ("bottom") number is called the diastolic pressure. It is a measure of the pressure in your arteries as the heart relaxes. What does my blood pressure reading mean? Blood pressure is classified into four stages. Based on your blood pressure reading, your health care provider may use the following stages to determine what type of treatment you need, if any. Systolic pressure and diastolic pressure are measured in a unit called mm Hg. Normal  Systolic pressure: below 884.  Diastolic pressure: below 80. Elevated  Systolic pressure: 166-063.  Diastolic pressure: below 80. Hypertension stage 1  Systolic pressure: 016-010.  Diastolic pressure: 93-23. Hypertension stage 2  Systolic  pressure: 557 or above.  Diastolic pressure: 90 or above. What health risks are associated with hypertension? Managing your hypertension is an important responsibility. Uncontrolled hypertension can lead to:  A heart attack.  A stroke.  A weakened blood vessel (aneurysm).  Heart failure.  Kidney damage.  Eye damage.  Metabolic syndrome.  Memory and concentration problems.  What changes can I make to manage my hypertension? Hypertension can be managed by making lifestyle changes and possibly by taking medicines. Your health care provider will help you make a plan to bring your blood pressure within a normal range. Eating and drinking  Eat a diet that is high in fiber and potassium, and low in salt (sodium), added sugar, and fat. An example eating plan is called the DASH (Dietary Approaches to Stop Hypertension) diet. To eat this way: ? Eat plenty of fresh fruits and vegetables. Try to fill half of your plate at each meal with fruits and vegetables. ? Eat whole grains, such as whole wheat pasta, brown rice, or whole grain bread. Fill about one quarter of your plate with whole grains. ? Eat low-fat diary products. ? Avoid fatty cuts of meat, processed or cured meats, and poultry with skin. Fill about one quarter of your plate with lean proteins such as fish, chicken without skin, beans, eggs, and tofu. ? Avoid premade and processed foods. These tend to be higher in sodium, added sugar, and fat.  Reduce your daily sodium intake. Most people with hypertension should eat less than 1,500 mg of sodium a day.  Limit alcohol intake to no more than 1 drink a day for  nonpregnant women and 2 drinks a day for men. One drink equals 12 oz of beer, 5 oz of wine, or 1 oz of hard liquor. Lifestyle  Work with your health care provider to maintain a healthy body weight, or to lose weight. Ask what an ideal weight is for you.  Get at least 30 minutes of exercise that causes your heart to beat  faster (aerobic exercise) most days of the week. Activities may include walking, swimming, or biking.  Include exercise to strengthen your muscles (resistance exercise), such as weight lifting, as part of your weekly exercise routine. Try to do these types of exercises for 30 minutes at least 3 days a week.  Do not use any products that contain nicotine or tobacco, such as cigarettes and e-cigarettes. If you need help quitting, ask your health care provider.  Control any long-term (chronic) conditions you have, such as high cholesterol or diabetes. Monitoring  Monitor your blood pressure at home as told by your health care provider. Your personal target blood pressure may vary depending on your medical conditions, your age, and other factors.  Have your blood pressure checked regularly, as often as told by your health care provider. Working with your health care provider  Review all the medicines you take with your health care provider because there may be side effects or interactions.  Talk with your health care provider about your diet, exercise habits, and other lifestyle factors that may be contributing to hypertension.  Visit your health care provider regularly. Your health care provider can help you create and adjust your plan for managing hypertension. Will I need medicine to control my blood pressure? Your health care provider may prescribe medicine if lifestyle changes are not enough to get your blood pressure under control, and if:  Your systolic blood pressure is 130 or higher.  Your diastolic blood pressure is 80 or higher.  Take medicines only as told by your health care provider. Follow the directions carefully. Blood pressure medicines must be taken as prescribed. The medicine does not work as well when you skip doses. Skipping doses also puts you at risk for problems. Contact a health care provider if:  You think you are having a reaction to medicines you have  taken.  You have repeated (recurrent) headaches.  You feel dizzy.  You have swelling in your ankles.  You have trouble with your vision. Get help right away if:  You develop a severe headache or confusion.  You have unusual weakness or numbness, or you feel faint.  You have severe pain in your chest or abdomen.  You vomit repeatedly.  You have trouble breathing. Summary  Hypertension is when the force of blood pumping through your arteries is too strong. If this condition is not controlled, it may put you at risk for serious complications.  Your personal target blood pressure may vary depending on your medical conditions, your age, and other factors. For most people, a normal blood pressure is less than 120/80.  Hypertension is managed by lifestyle changes, medicines, or both. Lifestyle changes include weight loss, eating a healthy, low-sodium diet, exercising more, and limiting alcohol. This information is not intended to replace advice given to you by your health care provider. Make sure you discuss any questions you have with your health care provider. Document Released: 10/21/2011 Document Revised: 12/25/2015 Document Reviewed: 12/25/2015 Elsevier Interactive Patient Education  [employer].     IF you received an x-ray today, you will receive an  invoice from [facility]. Please contact [facility] at [phone] with questions or concerns regarding your invoice.   IF you received labwork today, you will receive an invoice from [facility]. Please contact [facility] at [phone] with questions or concerns regarding your invoice.   Our billing staff will not be able to assist you with questions regarding bills from these companies.  You will be contacted with the lab results as soon as they are available. The fastest way to get your results is to activate your My Chart account. Instructions are located on the last page of this paperwork. If you have  not heard from [place] regarding the results in 2 weeks, please contact this office.

## 2017-05-05 ENCOUNTER — Other Ambulatory Visit: Payer: Self-pay | Admitting: Physician Assistant

## 2017-05-05 DIAGNOSIS — E119 Type 2 diabetes mellitus without complications: Secondary | ICD-10-CM

## 2017-05-05 MED FILL — CITALOPRAM HBR 20 MG TABLET: 20 | 30 days supply | Qty: 30 | Fill #2

## 2017-05-05 MED FILL — GLYCOPYRROLATE 1 MG TABLET: 1 | 30 days supply | Qty: 60 | Fill #1

## 2017-05-12 ENCOUNTER — Ambulatory Visit: Payer: 59

## 2017-05-12 ENCOUNTER — Ambulatory Visit: Payer: 59 | Admitting: Family Medicine

## 2017-05-12 ENCOUNTER — Encounter: Payer: Self-pay | Admitting: Family Medicine

## 2017-05-12 ENCOUNTER — Encounter: Payer: Self-pay | Admitting: Internal Medicine

## 2017-05-12 ENCOUNTER — Other Ambulatory Visit: Payer: Self-pay

## 2017-05-12 VITALS — BP 128/76 | HR 76 | Temp 98.5°F | Resp 16 | Ht 64.0 in | Wt 213.8 lb

## 2017-05-12 DIAGNOSIS — Z1211 Encounter for screening for malignant neoplasm of colon: Secondary | ICD-10-CM | POA: Diagnosis not present

## 2017-05-12 DIAGNOSIS — Z23 Encounter for immunization: Secondary | ICD-10-CM

## 2017-05-12 DIAGNOSIS — I10 Essential (primary) hypertension: Secondary | ICD-10-CM | POA: Diagnosis not present

## 2017-05-12 DIAGNOSIS — E119 Type 2 diabetes mellitus without complications: Secondary | ICD-10-CM

## 2017-05-12 DIAGNOSIS — Z6836 Body mass index (BMI) 36.0-36.9, adult: Secondary | ICD-10-CM

## 2017-05-12 LAB — POCT GLYCOSYLATED HEMOGLOBIN (HGB A1C): HEMOGLOBIN A1C: 5.5

## 2017-05-12 MED ORDER — BLOOD GLUCOSE METER KIT: PACK | 0 refills | Status: DC

## 2017-05-12 MED ORDER — LANCING DEVICE MISC: 0 refills | Status: DC

## 2017-05-12 NOTE — Patient Instructions (Signed)
     IF you received an x-ray today, you will receive an invoice from [facility]. Please contact [facility] at [phone] with questions or concerns regarding your invoice.   IF you received labwork today, you will receive an invoice from [facility]. Please contact [facility] at [phone] with questions or concerns regarding your invoice.   Our billing staff will not be able to assist you with questions regarding bills from these companies.  You will be contacted with the lab results as soon as they are available. The fastest way to get your results is to activate your My Chart account. Instructions are located on the last page of this paperwork. If you have not heard from us regarding the results in 2 weeks, please contact this office.     

## 2017-05-12 NOTE — Progress Notes (Deleted)
Patient ID: [name]                 DOB: 02-Jun-1965                      MRN: [id number]     HPI: Erin [name] is a 52 y.o. female patient of Dr. Harrington Challenger who presents today for hypertension follow up. PMH significant for DM and HTN. She has a strong family history of CAD. Since her last visit in HTN clinic, she was seen by Dr. Harrington Challenger and her pressure was elevated secondary to missed doses of medication. Since then she walked in with elevated pressures and was advised to take olmesartan 80mg .   She presents today for follow up.    Current HTN meds:  Amlodipine 10 mg daily Olmesartan 40mg  daily  HCTZ 25mg  daily Carvedilol 6.25mg  BID  Previously tried: Benazepril - cough  BP goal: <130/80  Family History: Mother- stroke, diabetes  Social History: Patient reports that she quit smoking after 15 years in 2000. She does not drink alcohol.   Diet: Patient notes that she eats fast foods mostly ([employer], Wendy's) and eats sandwiches. She drinks sweet tea when she eats out, so typically 2 cups a week.   Exercise: Does not partake in formal exercise, but walks a lot at work. She does note that she has lost 100+ lbs after starting metformin.   Home BP readings:    Wt Readings from Last 3 Encounters:  04/20/17 208 lb 9.6 oz (94.6 kg)  04/17/17 204 lb 9.6 oz (92.8 kg)  04/09/17 216 lb 12.8 oz (98.3 kg)   BP Readings from Last 3 Encounters:  04/20/17 (!) 147/81  04/19/17 (!) 170/90  04/17/17 (!) 176/94   Pulse Readings from Last 3 Encounters:  04/20/17 90  04/19/17 72  04/17/17 87    Renal function: CrCl cannot be calculated (Patient's most recent lab result is older than the maximum 21 days allowed.).  Past Medical History:  Diagnosis Date  . Anxiety   . Depression   . GERD (gastroesophageal reflux disease)   . Menorrhagia   . OSA on CPAP   . Type 2 diabetes mellitus ([place])   . Wears glasses     Current Outpatient Medications on File Prior to Visit    Medication Sig Dispense Refill  . amLODipine (NORVASC) 10 MG tablet Take 1 tablet (10 mg total) by mouth daily. 60 tablet 6  . carvedilol (COREG) 6.25 MG tablet Take 1 tablet (6.25 mg total) by mouth 2 (two) times daily with a meal. 60 tablet 6  . citalopram (CELEXA) 20 MG tablet Take 20 mg by mouth every morning.    . cyclobenzaprine (FLEXERIL) 10 MG tablet Take 1 tablet (10 mg total) by mouth 3 (three) times daily as needed for muscle spasms. 30 tablet 0  . hydrochlorothiazide (HYDRODIURIL) 25 MG tablet Take 1 tablet (25 mg total) by mouth daily. 90 tablet 1  . metFORMIN (GLUCOPHAGE) 500 MG tablet Take 1 tablet (500 mg total) by mouth daily with breakfast. Office visit needed for refills 30 tablet 0  . olmesartan (BENICAR) 40 MG tablet Take 1 tablet (40 mg total) by mouth daily.    [name] omeprazole (PRILOSEC) 40 MG capsule Take 40 mg by mouth every morning.    . rosuvastatin (CRESTOR) 10 MG tablet Take 1 tablet (10 mg total) by mouth daily. 90 tablet 3   Current Facility-Administered Medications on File Prior to Visit  Medication Dose Route Frequency Provider Last Rate Last Dose  . betamethasone acetate-betamethasone sodium phosphate (CELESTONE) injection 3 mg  3 mg Intramuscular Once Daylene Katayama M, DPM        No Known Allergies  There were no vitals taken for this visit.   Assessment/Plan: Hypertension:    Thank you, Erin [name]. Patterson Hammersmith, [facility]  05/12/2017 7:13 AM

## 2017-05-12 NOTE — Progress Notes (Signed)
Chief Complaint  Patient presents with  . Hypertension    follow up.  Pt has c/o continued low back pain, pt never recieved call for endo appt, and wants blood work for A1C    HPI   Hypertension: Patient here for follow-up of elevated blood pressure. She is exercising and is adherent to low salt diet.   BP Readings from Last 3 Encounters:  05/12/17 128/76  04/20/17 (!) 147/81  04/19/17 (!) 170/90   Amlodipine, carvedilol, olmesartan, hctz She reports that she is not exercising She works at [employer] She reports that she dances while walking to work  She works at [employer] after working at [employer] She states that she eats some of the food at work She states that she does not always get a lunch and if she does she might go to the food truck She reports that she does not plan lunches  Obesity She reports that she has lost over 100 pounds between 2016 and 2019 She states that she has kept it off She reports that it is a daily struggle She tries to have a balanced diet Body mass index is 36.7 kg/m. Wt Readings from Last 3 Encounters:  05/12/17 213 lb 12.8 oz (97 kg)  04/20/17 208 lb 9.6 oz (94.6 kg)  04/17/17 204 lb 9.6 oz (92.8 kg)   Diabetes Mellitus She is taking metformin  She does not get side effects She does not check sugars at home She can sometimes get dizziness and nausea but does not check her sugars She sometimes gets the sweats late nights Lab Results  Component Value Date   HGBA1C 5.5 05/12/2017     Past Medical History:  Diagnosis Date  . Anxiety   . Depression   . GERD (gastroesophageal reflux disease)   . Menorrhagia   . OSA on CPAP   . Type 2 diabetes mellitus ([place])   . Wears glasses     Current Outpatient Medications  Medication Sig Dispense Refill  . amLODipine (NORVASC) 10 MG tablet Take 1 tablet (10 mg total) by mouth daily. 60 tablet 6  . carvedilol (COREG) 6.25 MG tablet Take 1 tablet (6.25 mg total) by mouth 2  (two) times daily with a meal. 60 tablet 6  . citalopram (CELEXA) 20 MG tablet Take 20 mg by mouth every morning.    . cyclobenzaprine (FLEXERIL) 10 MG tablet Take 1 tablet (10 mg total) by mouth 3 (three) times daily as needed for muscle spasms. 30 tablet 0  . olmesartan (BENICAR) 40 MG tablet Take 1 tablet (40 mg total) by mouth daily.    [name] omeprazole (PRILOSEC) 40 MG capsule Take 40 mg by mouth every morning.    . rosuvastatin (CRESTOR) 10 MG tablet Take 1 tablet (10 mg total) by mouth daily. 90 tablet 3  . blood glucose meter kit and supplies Use up to 3 times daily as directed. E11.9 1 each 0  . hydrochlorothiazide (HYDRODIURIL) 25 MG tablet Take 1 tablet (25 mg total) by mouth daily. 90 tablet 1  . [employer] (LANCING DEVICE) MISC Use to check blood glucose as needed. E11.9 1 each 0  . metFORMIN (GLUCOPHAGE) 500 MG tablet Take 1 tablet (500 mg total) by mouth daily with breakfast. Office visit needed for refills 30 tablet 0   Current Facility-Administered Medications  Medication Dose Route Frequency Provider Last Rate Last Dose  . betamethasone acetate-betamethasone sodium phosphate (CELESTONE) injection 3 mg  3 mg Intramuscular Once Evans,  Dorathy Daft, DPM        Allergies: No Known Allergies  Past Surgical History:  Procedure Laterality Date  . DILITATION & CURRETTAGE/HYSTROSCOPY WITH NOVASURE ABLATION N/A 08/26/2015   Procedure: DILATATION & CURETTAGE/HYSTEROSCOPY WITH NOVASURE ABLATION;  Surgeon: [name], MD;  Location: [facility];  Service: Gynecology;  Laterality: N/A;  . EXCISIONAL LEFT BREAST BX  11-14-2007   benign  . LAPAROSCOPIC TUBAL LIGATION Bilateral 08/26/2015   Procedure: LAPAROSCOPIC TUBAL LIGATION;  Surgeon: [name], MD;  [facility];  Service: Gynecology;  Laterality: Bilateral;  . THYROID LOBECTOMY Right 04-30-2008    Social History   Socioeconomic History  . Marital status: Widowed    Spouse name: Not on file    . Number of children: Not on file  . Years of education: Not on file  . Highest education level: Not on file  Occupational History  . Not on file  Social Needs  . Financial resource strain: Not on file  . Food insecurity:    Worry: Not on file    Inability: Not on file  . Transportation needs:    Medical: Not on file    Non-medical: Not on file  Tobacco Use  . Smoking status: Former Smoker    Years: 15.00    Types: Cigarettes  . Smokeless tobacco: Never Used  Substance and Sexual Activity  . Alcohol use: No  . Drug use: No  . Sexual activity: Never  Lifestyle  . Physical activity:    Days per week: Not on file    Minutes per session: Not on file  . Stress: Not on file  Relationships  . Social connections:    Talks on phone: Not on file    Gets together: Not on file    Attends religious service: Not on file    Active member of club or organization: Not on file    Attends meetings of clubs or organizations: Not on file    Relationship status: Not on file  Other Topics Concern  . Not on file  Social History Narrative  . Not on file    Family History  Problem Relation Age of Onset  . Stroke Mother   . Diabetes Mother      ROS Review of Systems See HPI Constitution: No fevers or chills No malaise No diaphoresis Skin: No rash or itching Eyes: no blurry vision, no double vision GU: no dysuria or hematuria Neuro: no dizziness or headaches all others reviewed and negative   Objective: Vitals:   05/12/17 1015  BP: 128/76  Pulse: 76  Resp: 16  Temp: 98.5 F (36.9 C)  TempSrc: Oral  SpO2: 95%  Weight: 213 lb 12.8 oz (97 kg)  Height: _0  (1.626 m)   Diabetic Foot Exam - Simple   No data filed      Physical Exam  Constitutional: She is oriented to person, place, and time. She appears well-developed and well-nourished.  HENT:  Head: Normocephalic and atraumatic.  Eyes: Conjunctivae and EOM are normal.  Cardiovascular: Normal rate, regular rhythm  and normal heart sounds.  No murmur heard. Pulmonary/Chest: Effort normal and breath sounds normal. No stridor. No respiratory distress.  Musculoskeletal: Normal range of motion. She exhibits no edema.  Neurological: She is alert and oriented to person, place, and time.  Skin: Skin is warm. Capillary refill takes less than 2 seconds.  Psychiatric: She has a normal mood and affect. Her behavior is normal. Judgment and thought  content normal.    Assessment and Plan Jamelle was seen today for hypertension.  Diagnoses and all orders for this visit:  Essential hypertension- bp at goal, advised to check her bp at home Continue weight loss DASH diet Will discuss drug holiday as she continues to lose weight and maintain a stable well controlled bp  Well controlled diabetes mellitus ([place])- continue metformin  Discussed that is she gets hypoglycemic symptoms to check her sugars  She should continue metformin as it helps with weight mgmt -     POCT glycosylated hemoglobin (Hb A1C) -     HM Diabetes Foot Exam  Colon cancer screening -     Ambulatory referral to Gastroenterology  Class 2 severe obesity due to excess calories with serious comorbidity and body mass index (BMI) of 36.0 to 36.9 in adult [facility])-  Discussed weight loss goals and dietary modifications  Other orders -     Pneumococcal conjugate vaccine 13-valent IM -     Tdap vaccine greater than or equal to 7yo IM -     blood glucose meter kit and supplies; Use up to 3 times daily as directed. E11.9 -     Lancet Devices (LANCING DEVICE) MISC; Use to check blood glucose as needed. E11.9     Kjerstin Abrigo A Aldine Chakraborty

## 2017-05-13 MED FILL — FREESTYLE LANCETS: 30 days supply | Qty: 100 | Fill #0

## 2017-05-13 MED FILL — FREESTYLE LITE TEST STRIP: 30 days supply | Qty: 100 | Fill #0

## 2017-05-13 MED FILL — FREESTYLE LITE METER: 30 days supply | Qty: 1 | Fill #0

## 2017-05-14 ENCOUNTER — Ambulatory Visit: Payer: 59 | Admitting: Physician Assistant

## 2017-05-27 ENCOUNTER — Other Ambulatory Visit: Payer: Self-pay | Admitting: Family Medicine

## 2017-05-27 MED ORDER — FREESTYLE FREEDOM LITE W/DEVICE KIT: PACK | 1 refills | Status: DC

## 2017-05-27 MED ORDER — GLUCOSE BLOOD VI STRP: ORAL_STRIP | 12 refills | Status: DC

## 2017-05-27 MED ORDER — FREESTYLE LANCETS MISC: 12 refills | Status: DC

## 2017-06-16 ENCOUNTER — Ambulatory Visit: Payer: 59 | Admitting: Family Medicine

## 2017-06-16 ENCOUNTER — Other Ambulatory Visit: Payer: Self-pay | Admitting: Physician Assistant

## 2017-06-16 DIAGNOSIS — E119 Type 2 diabetes mellitus without complications: Secondary | ICD-10-CM

## 2017-06-16 MED FILL — metFORMIN HCL 500 MG TABS: 500 | 30 days supply | Qty: 30 | Fill #0

## 2017-06-16 MED FILL — CARVEDILOL 6.25 MG TABLET: 6.25 | 30 days supply | Qty: 60 | Fill #1

## 2017-06-16 MED FILL — CITALOPRAM HBR 20 MG TABLET: 20 | 30 days supply | Qty: 30 | Fill #3

## 2017-06-16 MED FILL — GLYCOPYRROLATE 1 MG TABLET: 1 | 30 days supply | Qty: 60 | Fill #2

## 2017-06-16 MED FILL — HYDROCHLOROTHIAZIDE 25 MG T: 25 | 90 days supply | Qty: 90 | Fill #1

## 2017-06-16 MED FILL — AMLODIPINE BESYLATE 10 MG T: 10 | 60 days supply | Qty: 60 | Fill #1

## 2017-06-16 MED FILL — OLMESARTAN MEDOXOMIL 40 MG: 40 | 90 days supply | Qty: 90 | Fill #1

## 2017-06-23 ENCOUNTER — Other Ambulatory Visit: Payer: 59 | Admitting: *Deleted

## 2017-06-23 DIAGNOSIS — E782 Mixed hyperlipidemia: Secondary | ICD-10-CM | POA: Diagnosis not present

## 2017-06-23 LAB — LIPID PANEL
CHOLESTEROL TOTAL: 133 mg/dL (ref 100–199)
Chol/HDL Ratio: 3 ratio (ref 0.0–4.4)
HDL: 45 mg/dL (ref >39)
LDL Calculated: 77 mg/dL (ref 0–99)
Triglycerides: 55 mg/dL (ref 0–149)
VLDL CHOLESTEROL CAL: 11 mg/dL (ref 5–40)

## 2017-06-23 LAB — HM DIABETES EYE EXAM

## 2017-06-30 ENCOUNTER — Encounter: Payer: Self-pay | Admitting: Family Medicine

## 2017-06-30 ENCOUNTER — Ambulatory Visit: Payer: 59 | Admitting: Family Medicine

## 2017-06-30 ENCOUNTER — Other Ambulatory Visit: Payer: Self-pay

## 2017-06-30 VITALS — BP 118/62 | HR 83 | Temp 98.3°F | Resp 16 | Ht 64.0 in | Wt 214.8 lb

## 2017-06-30 DIAGNOSIS — G4733 Obstructive sleep apnea (adult) (pediatric): Secondary | ICD-10-CM

## 2017-06-30 DIAGNOSIS — Z9989 Dependence on other enabling machines and devices: Secondary | ICD-10-CM

## 2017-06-30 DIAGNOSIS — I10 Essential (primary) hypertension: Secondary | ICD-10-CM

## 2017-06-30 MED ORDER — AMLODIPINE BESYLATE 10 MG PO TABS: 5.0000 mg | ORAL_TABLET | Freq: Every day | ORAL | 6 refills | Status: DC

## 2017-06-30 NOTE — Progress Notes (Signed)
Chief Complaint  Patient presents with  . Hypertension    one month    HPI   Hypertension: Patient here for follow-up of elevated blood pressure. She is exercising and is adherent to low salt diet.  Blood pressure is well controlled at home. Cardiac symptoms none. Patient denies chest pain, chest pressure/discomfort, claudication, exertional chest pressure/discomfort, irregular heart beat, lower extremity edema, near-syncope, orthopnea and paroxysmal nocturnal dyspnea.  Cardiovascular risk factors: diabetes mellitus and hypertension. Use of agents associated with hypertension: none. History of target organ damage: none. She has lost 116 pounds in the past 10 years BP Readings from Last 3 Encounters:  06/30/17 118/62  05/12/17 128/76  04/20/17 (!) 147/81   OSA on CPAP She reports that she lost weight and is wondering if she needs the CPAP anymore She has been on the CPAP since 1995 Her last sleep study was done 07/02/2006 with [facility] She reports that she has lost 116 pounds since her diagnosis She reports that it works for her and she uses it every night.    Past Medical History:  Diagnosis Date  . Anxiety   . Depression   . GERD (gastroesophageal reflux disease)   . Menorrhagia   . OSA on CPAP   . Type 2 diabetes mellitus ([place])   . Wears glasses     Current Outpatient Medications  Medication Sig Dispense Refill  . amLODipine (NORVASC) 10 MG tablet Take 0.5 tablets (5 mg total) by mouth daily. 60 tablet 6  . blood glucose meter kit and supplies Use up to 3 times daily as directed. E11.9 1 each 0  . Blood Glucose Monitoring Suppl (FREESTYLE FREEDOM LITE) w/Device KIT E 11.9 Use to check blood glucose once daily 1 each 1  . carvedilol (COREG) 6.25 MG tablet Take 1 tablet (6.25 mg total) by mouth 2 (two) times daily with a meal. 60 tablet 6  . citalopram (CELEXA) 20 MG tablet Take 20 mg by mouth every morning.    [name] glucose blood (FREESTYLE LITE) test strip Test blood  glucose once a day. E11.9. Freestyle Lite 100 each 12  . Lancet Devices (LANCING DEVICE) MISC Use to check blood glucose as needed. E11.9 1 each 0  . Lancets (FREESTYLE) lancets E11.9 Use as instructed. Check blood glucose once daily 100 each 12  . metFORMIN (GLUCOPHAGE) 500 MG tablet TAKE 1 TABLET BY MOUTH DAILY WITH BREAKFAST 30 tablet 0  . olmesartan (BENICAR) 40 MG tablet Take 1 tablet (40 mg total) by mouth daily.    [name] omeprazole (PRILOSEC) 40 MG capsule Take 40 mg by mouth every morning.    . rosuvastatin (CRESTOR) 10 MG tablet Take 1 tablet (10 mg total) by mouth daily. 90 tablet 3  . cyclobenzaprine (FLEXERIL) 10 MG tablet Take 1 tablet (10 mg total) by mouth 3 (three) times daily as needed for muscle spasms. (Patient not taking: Reported on 06/30/2017) 30 tablet 0  . hydrochlorothiazide (HYDRODIURIL) 25 MG tablet Take 1 tablet (25 mg total) by mouth daily. 90 tablet 1   Current Facility-Administered Medications  Medication Dose Route Frequency Provider Last Rate Last Dose  . betamethasone acetate-betamethasone sodium phosphate (CELESTONE) injection 3 mg  3 mg Intramuscular Once Edrick Kins, DPM        Allergies: No Known Allergies  Past Surgical History:  Procedure Laterality Date  . DILITATION & CURRETTAGE/HYSTROSCOPY WITH NOVASURE ABLATION N/A 08/26/2015   Procedure: DILATATION & CURETTAGE/HYSTEROSCOPY WITH NOVASURE ABLATION;  Surgeon: [name], MD;  Location:  [place];  Service: Gynecology;  Laterality: N/A;  . EXCISIONAL LEFT BREAST BX  11-14-2007   benign  . LAPAROSCOPIC TUBAL LIGATION Bilateral 08/26/2015   Procedure: LAPAROSCOPIC TUBAL LIGATION;  Surgeon: [name], MD;  Location: [facility];  Service: Gynecology;  Laterality: Bilateral;  . THYROID LOBECTOMY Right 04-30-2008    Social History   Socioeconomic History  . Marital status: Widowed    Spouse name: Not on file  . Number of children: Not on file  . Years of education:  Not on file  . Highest education level: Not on file  Occupational History  . Not on file  Social Needs  . Financial resource strain: Not on file  . Food insecurity:    Worry: Not on file    Inability: Not on file  . Transportation needs:    Medical: Not on file    Non-medical: Not on file  Tobacco Use  . Smoking status: Former Smoker    Years: 15.00    Types: Cigarettes  . Smokeless tobacco: Never Used  Substance and Sexual Activity  . Alcohol use: No  . Drug use: No  . Sexual activity: Never  Lifestyle  . Physical activity:    Days per week: Not on file    Minutes per session: Not on file  . Stress: Not on file  Relationships  . Social connections:    Talks on phone: Not on file    Gets together: Not on file    Attends religious service: Not on file    Active member of club or organization: Not on file    Attends meetings of clubs or organizations: Not on file    Relationship status: Not on file  Other Topics Concern  . Not on file  Social History Narrative  . Not on file    Family History  Problem Relation Age of Onset  . Stroke Mother   . Diabetes Mother      ROS Review of Systems See HPI Constitution: No fevers or chills No malaise No diaphoresis Skin: No rash or itching Eyes: no blurry vision, no double vision GU: no dysuria or hematuria Neuro: no dizziness or headaches  all others reviewed and negative   Objective: Vitals:   06/30/17 1529  BP: 118/62  Pulse: 83  Resp: 16  Temp: 98.3 F (36.8 C)  TempSrc: Oral  SpO2: 96%  Weight: 214 lb 12.8 oz (97.4 kg)  Height: '5\' 4"'  (1.626 m)    Physical Exam  Constitutional: She is oriented to person, place, and time. She appears well-developed and well-nourished.  HENT:  Head: Normocephalic and atraumatic.  Eyes: Conjunctivae and EOM are normal.  Cardiovascular: Normal rate, regular rhythm and normal heart sounds.  No murmur heard. Pulmonary/Chest: Effort normal and breath sounds normal. No  stridor. No respiratory distress.  Neurological: She is alert and oriented to person, place, and time.  Skin: Skin is warm. Capillary refill takes less than 2 seconds.  Psychiatric: She has a normal mood and affect. Her behavior is normal. Judgment and thought content normal.      Assessment and Plan Donnajean was seen today for hypertension.  Diagnoses and all orders for this visit:  OSA on CPAP- will refer for sleep study -     Ambulatory referral to Sleep Studies  Essential hypertension Will start to taper down bp meds Bp well control -     amLODipine (NORVASC) 10 MG tablet; Take 0.5 tablets (5 mg total)  by mouth daily.     [place]

## 2017-06-30 NOTE — Patient Instructions (Addendum)
IF you received an x-ray today, you will receive an invoice from [facility]. Please contact [facility] at [phone] with questions or concerns regarding your invoice.   IF you received labwork today, you will receive an invoice from [facility]. Please contact [facility] at [phone] with questions or concerns regarding your invoice.   Our billing staff will not be able to assist you with questions regarding bills from these companies.  You will be contacted with the lab results as soon as they are available. The fastest way to get your results is to activate your My Chart account. Instructions are located on the last page of this paperwork. If you have not heard from [place] regarding the results in 2 weeks, please contact this office.     CPAP and BiPAP Information CPAP and BiPAP are methods of helping a person breathe with the use of air pressure. CPAP stands for "continuous positive airway pressure." BiPAP stands for "bi-level positive airway pressure." In both methods, air is blown through your nose or mouth and into your air passages to help you breathe well. CPAP and BiPAP use different amounts of pressure to blow air. With CPAP, the amount of pressure stays the same while you breathe in and out. With BiPAP, the amount of pressure is increased when you breathe in (inhale) so that you can take larger breaths. Your health care provider will recommend whether CPAP or BiPAP would be more helpful for you. Why are CPAP and BiPAP treatments used? CPAP or BiPAP can be helpful if you have:  Sleep apnea.  Chronic obstructive pulmonary disease (COPD).  Heart failure.  Medical conditions that weaken the muscles of the chest including muscular dystrophy, or neurological diseases such as amyotrophic lateral sclerosis (ALS).  Other problems that cause breathing to be weak, abnormal, or difficult.  CPAP is most commonly used for obstructive sleep apnea (OSA) to keep the  airways from collapsing when the muscles relax during sleep. How is CPAP or BiPAP administered? Both CPAP and BiPAP are provided by a small machine with a flexible plastic tube that attaches to a plastic mask. You wear the mask. Air is blown through the mask into your nose or mouth. The amount of pressure that is used to blow the air can be adjusted on the machine. Your health care provider will determine the pressure setting that should be used based on your individual needs. When should CPAP or BiPAP be used? In most cases, the mask only needs to be worn during sleep. Generally, the mask needs to be worn throughout the night and during any daytime naps. People with certain medical conditions may also need to wear the mask at other times when they are awake. Follow instructions from your health care provider about when to use the machine. What are some tips for using the mask?  Because the mask needs to be snug, some people feel trapped or closed-in (claustrophobic) when first using the mask. If you feel this way, you may need to get used to the mask. One way to do this is by holding the mask loosely over your nose or mouth and then gradually applying the mask more snugly. You can also gradually increase the amount of time that you use the mask.  Masks are available in various types and sizes. Some fit over your mouth and nose while others fit over just your nose. If your mask does not fit well, talk with your health care provider about getting a  different one.  If you are using a mask that fits over your nose and you tend to breathe through your mouth, a chin strap may be applied to help keep your mouth closed.  The CPAP and BiPAP machines have alarms that may sound if the mask comes off or develops a leak.  If you have trouble with the mask, it is very important that you talk with your health care provider about finding a way to make the mask easier to tolerate. Do not stop using the mask. Stopping  the use of the mask could have a negative impact on your health. What are some tips for using the machine?  Place your CPAP or BiPAP machine on a secure table or stand near an electrical outlet.  Know where the on/off switch is located on the machine.  Follow instructions from your health care provider about how to set the pressure on your machine and when you should use it.  Do not eat or drink while the CPAP or BiPAP machine is on. Food or fluids could get pushed into your lungs by the pressure of the CPAP or BiPAP.  Do not smoke. Tobacco smoke residue can damage the machine.  For home use, CPAP and BiPAP machines can be rented or purchased through home health care companies. Many different brands of machines are available. Renting a machine before purchasing may help you find out which particular machine works well for you.  Keep the CPAP or BiPAP machine and attachments clean. Ask your health care provider for specific instructions. Get help right away if:  You have redness or open areas around your nose or mouth where the mask fits.  You have trouble using the CPAP or BiPAP machine.  You cannot tolerate wearing the CPAP or BiPAP mask.  You have pain, discomfort, and bloating in your abdomen. Summary  CPAP and BiPAP are methods of helping a person breathe with the use of air pressure.  Both CPAP and BiPAP are provided by a small machine with a flexible plastic tube that attaches to a plastic mask.  If you have trouble with the mask, it is very important that you talk with your health care provider about finding a way to make the mask easier to tolerate. This information is not intended to replace advice given to you by your health care provider. Make sure you discuss any questions you have with your health care provider. Document Released: 10/25/2003 Document Revised: 12/16/2015 Document Reviewed: 12/16/2015 Elsevier Interactive Patient Education  2017 [employer].

## 2017-07-06 ENCOUNTER — Other Ambulatory Visit: Payer: Self-pay | Admitting: Obstetrics and Gynecology

## 2017-07-06 ENCOUNTER — Ambulatory Visit
Admission: RE | Admit: 2017-07-06 | Discharge: 2017-07-06 | Disposition: A | Discharge status: Home / Self Care | Payer: 59 | Source: Ambulatory Visit | Attending: Obstetrics and Gynecology | Admitting: Obstetrics and Gynecology

## 2017-07-06 DIAGNOSIS — Z09 Encounter for follow-up examination after completed treatment for conditions other than malignant neoplasm: Secondary | ICD-10-CM

## 2017-07-06 DIAGNOSIS — R921 Mammographic calcification found on diagnostic imaging of breast: Secondary | ICD-10-CM | POA: Diagnosis not present

## 2017-07-12 DIAGNOSIS — L74519 Primary focal hyperhidrosis, unspecified: Secondary | ICD-10-CM | POA: Diagnosis not present

## 2017-07-14 ENCOUNTER — Ambulatory Visit (AMBULATORY_SURGERY_CENTER): Payer: Self-pay | Admitting: *Deleted

## 2017-07-14 ENCOUNTER — Encounter: Payer: Self-pay | Admitting: *Deleted

## 2017-07-14 ENCOUNTER — Other Ambulatory Visit: Payer: Self-pay

## 2017-07-14 VITALS — Ht 63.0 in | Wt 223.0 lb

## 2017-07-14 DIAGNOSIS — Z1211 Encounter for screening for malignant neoplasm of colon: Secondary | ICD-10-CM

## 2017-07-14 NOTE — Progress Notes (Signed)
Patient denies any allergies to eggs or soy. Patient denies any problems with anesthesia/sedation. Patient denies any oxygen use at home. Patient denies taking any diet/weight loss medications or blood thinners. Patient came in for PV today. She request to change appointment for colonoscopy. New appointment made for Aug. 19, PV Aug. 6. Pt aware.

## 2017-07-20 MED FILL — CITALOPRAM HBR 20 MG TABLET: 20 | 30 days supply | Qty: 30 | Fill #4

## 2017-07-20 NOTE — Progress Notes (Signed)
Patient ID: Erin [name], female   DOB: 1966-01-20, 52 y.o.   MRN: [id number]          Referring physician: Nolon Rod  Chief complaint: Growth on adrenal gland  History of Present Illness:   Patient apparently had a CT scan done for other reasons in 05/2016 Findings included a 3.1 cm heterogeneously enhancing left adrenal nodule demonstrates areas of decreased attenuation, compatible with an adrenal adenoma  Patient is now being referred for evaluation of the significance of this adrenal adenoma She has been having some discomfort on her left back earlier this year and was concerned that it may be related to her adrenal growth  She has a significant history of HYPERTENSION since 1997 Although this has been previously difficult to control she thinks blood pressure control has been better with adding amlodipine and Benicar in 2018 This is followed by her cardiologist She is also taking HCTZ for quite some time and also low-dose 6.25 mg carvedilol twice daily  She does not have any history of HEADACHES, flushing, episodes of lightheadedness, palpitations  She has not had any history of muscle weakness especially legs, has no known diabetes, vertebral fractures or unusual weight gain She does have easy bruising   BP Readings from Last 3 Encounters:  07/21/17 138/78  06/30/17 118/62  05/12/17 128/76   1997  322  Wt Readings from Last 3 Encounters:  07/21/17 224 lb 12.8 oz (102 kg)  07/14/17 223 lb (101.2 kg)  06/30/17 214 lb 12.8 oz (97.4 kg)   No kids    Past Medical History:  Diagnosis Date  . Anxiety   . Depression   . GERD (gastroesophageal reflux disease)   . Menorrhagia   . OSA on CPAP   . Sleep apnea   . Type 2 diabetes mellitus ([place])   . Wears glasses     Past Surgical History:  Procedure Laterality Date  . DILITATION & CURRETTAGE/HYSTROSCOPY WITH NOVASURE ABLATION N/A 08/26/2015   Procedure: DILATATION & CURETTAGE/HYSTEROSCOPY WITH NOVASURE  ABLATION;  Surgeon: [name], MD;  Location: [place];  Service: Gynecology;  Laterality: N/A;  . EXCISIONAL LEFT BREAST BX  11-14-2007   benign  . LAPAROSCOPIC TUBAL LIGATION Bilateral 08/26/2015   Procedure: LAPAROSCOPIC TUBAL LIGATION;  Surgeon: [name], MD;  Location[facility];  Service: Gynecology;  Laterality: Bilateral;  . THYROID LOBECTOMY Right 04-30-2008    Family History  Problem Relation Age of Onset  . Stroke Mother   . Diabetes Mother   . Colon cancer Neg Hx   . Esophageal cancer Neg Hx   . Stomach cancer Neg Hx     Social History:  reports that she has quit smoking. Her smoking use included cigarettes. She quit after 15.00 years of use. She has never used smokeless tobacco. She reports that she does not drink alcohol or use drugs.  Allergies: No Known Allergies  Allergies as of 07/21/2017   No Known Allergies     Medication List        Accurate as of 07/21/17  4:38 PM. Always use your most recent med list.          amLODipine 10 MG tablet Commonly known as:  NORVASC Take 0.5 tablets (5 mg total) by mouth daily.   blood glucose meter kit and supplies Use up to 3 times daily as directed. E11.9   carvedilol 6.25 MG tablet Commonly known as:  COREG Take 1 tablet (6.25 mg total) by mouth  2 (two) times daily with a meal.   citalopram 20 MG tablet Commonly known as:  CELEXA Take 20 mg by mouth every morning.   FREESTYLE FREEDOM LITE w/Device Kit E 11.9 Use to check blood glucose once daily   freestyle lancets E11.9 Use as instructed. Check blood glucose once daily   glucose blood test strip Commonly known as:  FREESTYLE LITE Test blood glucose once a day. E11.9. Freestyle Lite   glycopyrrolate 1 MG tablet Commonly known as:  ROBINUL Take 1 tablet by mouth 2 (two) times daily.   hydrochlorothiazide 25 MG tablet Commonly known as:  HYDRODIURIL Take 1 tablet (25 mg total) by mouth daily.   Lancing Device  Misc Use to check blood glucose as needed. E11.9   metFORMIN 500 MG tablet Commonly known as:  GLUCOPHAGE TAKE 1 TABLET BY MOUTH DAILY WITH BREAKFAST   MULTIVITAMIN GUMMIES ADULT PO Take 2 each by mouth daily.   olmesartan 40 MG tablet Commonly known as:  BENICAR Take 1 tablet (40 mg total) by mouth daily.   omeprazole 40 MG capsule Commonly known as:  PRILOSEC Take 40 mg by mouth every morning.   rosuvastatin 10 MG tablet Commonly known as:  CRESTOR Take 1 tablet (10 mg total) by mouth daily.       LABS:  No visits with results within 1 Week(s) from this visit.  Latest known visit with results is:  Lab on 06/23/2017  Component Date Value Ref Range Status  . Cholesterol, Total 06/23/2017 133  100 - 199 mg/dL Final  . Triglycerides 06/23/2017 55  0 - 149 mg/dL Final  . HDL 06/23/2017 45  >39 mg/dL Final  . VLDL Cholesterol Cal 06/23/2017 11  5 - 40 mg/dL Final  . LDL Calculated 06/23/2017 77  0 - 99 mg/dL Final  . Chol/HDL Ratio 06/23/2017 3.0  0.0 - 4.4 ratio Final   Comment:                                   T. Chol/HDL Ratio                                             Men  Women                               1/2 Avg.Risk  3.4    3.3                                   Avg.Risk  5.0    4.4                                2X Avg.Risk  9.6    7.1                                3X Avg.Risk 23.4   11.0         Review of Systems  Constitutional: Negative for weight loss, weight gain and reduced appetite.  HENT: Negative for trouble swallowing.   Respiratory: Negative for shortness of  breath.   Cardiovascular: Negative for palpitations and leg swelling.  Endocrine:       Has mild hot flashes  Genitourinary: Negative for frequency.  Musculoskeletal:       Recently no back pain, this has resolved  Skin:       Has history of excessive sweating in her underarms which is controlled with medication  Neurological: Negative for numbness.  Psychiatric/Behavioral:        History of depression, controlled     PHYSICAL EXAM:  BP 138/78 (BP Location: Left Arm, Patient Position: Standing, Cuff Size: Normal)   Pulse 96   Ht '5\' 3"'  (1.6 m)   Wt 224 lb 12.8 oz (102 kg)   SpO2 96%   BMI 39.82 kg/m   GENERAL: She has generalized obesity No supraclavicular fat pads, no buffalo hump Skin is not unusually thin No obvious ecchymoses seen No significant facial hair oral patient is seen  No pallor, clubbing, lymphadenopathy or edema.   EYES:  Externally normal.  Fundii:  normal discs and vessels.  ENT: Oral mucosa and tongue normal.  THYROID:  Not palpable.  HEART:  Normal  S1 and S2; no murmur or click.  CHEST:  Normal shape.  Lungs: Vescicular breath sounds heard equally.  No crepitations/ wheeze.  ABDOMEN:  No distention.  Liver and spleen not palpable.  No other mass or tenderness.  NEUROLOGICAL: .Reflexes are bilaterally normal at biceps.  JOINTS:  Normal peripheral joints.   ASSESSMENT:    3 cm benign adrenal adenoma seen on CT scan in 05/2016. Although she has had significant hypertension she has had fairly consistent control for about a year or an current regimen.  Also has had normal potassium levels even on diuretics making it unlikely that she has hyperaldosteronism She has no clinical features suggestive of Cushing's disease on exam     PLAN:    Will need to rule out excess adrenal gland function in particular with cortisol and adrenergic function.  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  Also may do follow-up CT scan to review the size of her adrenal tumor   Consultation note sent to the referring physician  Elayne Snare 07/21/2017, 4:38 PM

## 2017-07-21 ENCOUNTER — Ambulatory Visit: Payer: 59 | Admitting: Endocrinology

## 2017-07-21 ENCOUNTER — Encounter: Payer: Self-pay | Admitting: Endocrinology

## 2017-07-21 VITALS — BP 138/78 | HR 96 | Ht 63.0 in | Wt 224.8 lb

## 2017-07-21 DIAGNOSIS — E278 Other specified disorders of adrenal gland: Secondary | ICD-10-CM | POA: Diagnosis not present

## 2017-07-21 DIAGNOSIS — I10 Essential (primary) hypertension: Secondary | ICD-10-CM | POA: Diagnosis not present

## 2017-07-22 MED ORDER — DEXAMETHASONE 1 MG PO TABS: ORAL_TABLET | ORAL | 0 refills | Status: DC

## 2017-07-22 MED FILL — DEXAMETHASONE 1 MG TABLET: 1 | 1 days supply | Qty: 1 | Fill #0

## 2017-07-28 ENCOUNTER — Encounter: Payer: 59 | Admitting: Internal Medicine

## 2017-08-02 ENCOUNTER — Other Ambulatory Visit: Payer: Self-pay | Admitting: Endocrinology

## 2017-08-02 ENCOUNTER — Other Ambulatory Visit (INDEPENDENT_AMBULATORY_CARE_PROVIDER_SITE_OTHER): Payer: 59

## 2017-08-02 DIAGNOSIS — I10 Essential (primary) hypertension: Secondary | ICD-10-CM

## 2017-08-02 DIAGNOSIS — E278 Other specified disorders of adrenal gland: Secondary | ICD-10-CM

## 2017-08-02 LAB — BASIC METABOLIC PANEL
BUN: 17 mg/dL (ref 6–23)
CO2: 27 mEq/L (ref 19–32)
Calcium: 9.7 mg/dL (ref 8.4–10.5)
Chloride: 103 mEq/L (ref 96–112)
Creatinine, Ser: 0.82 mg/dL (ref 0.40–1.20)
GFR: 94.36 mL/min (ref >60.00)
GLUCOSE: 95 mg/dL (ref 70–99)
Potassium: 3.5 mEq/L (ref 3.5–5.1)
Sodium: 136 mEq/L (ref 135–145)

## 2017-08-02 LAB — CORTISOL: Cortisol, Plasma: 2.2 ug/dL

## 2017-08-06 ENCOUNTER — Other Ambulatory Visit: Payer: Self-pay | Admitting: Family Medicine

## 2017-08-06 DIAGNOSIS — E119 Type 2 diabetes mellitus without complications: Secondary | ICD-10-CM

## 2017-08-06 LAB — METANEPHRINES, PLASMA
Metanephrine, Free: 15 pg/mL (ref 0–62)
NORMETANEPHRINE FREE: 33 pg/mL (ref 0–145)

## 2017-08-06 MED FILL — metFORMIN HCL 500 MG TABS: 500 | 30 days supply | Qty: 30 | Fill #0

## 2017-08-06 MED FILL — CARVEDILOL 6.25 MG TABLET: 6.25 | 30 days supply | Qty: 60 | Fill #2

## 2017-08-06 MED FILL — ROSUVASTATIN CALCIUM 10 MG: 10 | 90 days supply | Qty: 90 | Fill #1

## 2017-08-11 NOTE — Progress Notes (Signed)
Please call to let patient know that the lab results are normal and no further action needed

## 2017-08-18 ENCOUNTER — Institutional Professional Consult (permissible substitution): Payer: 59 | Admitting: Pulmonary Disease

## 2017-08-26 MED FILL — AMLODIPINE BESYLATE 10 MG T: 10 | 60 days supply | Qty: 60 | Fill #2

## 2017-08-26 MED FILL — CITALOPRAM HBR 20 MG TABLET: 20 | 30 days supply | Qty: 30 | Fill #5

## 2017-08-31 MED FILL — GLYCOPYRROLATE 1 MG TABLET: 1 | 30 days supply | Qty: 60 | Fill #0

## 2017-09-03 ENCOUNTER — Encounter (INDEPENDENT_AMBULATORY_CARE_PROVIDER_SITE_OTHER): Payer: Self-pay | Admitting: Orthopaedic Surgery

## 2017-09-03 ENCOUNTER — Ambulatory Visit (INDEPENDENT_AMBULATORY_CARE_PROVIDER_SITE_OTHER): Payer: 59 | Admitting: Orthopaedic Surgery

## 2017-09-03 ENCOUNTER — Ambulatory Visit (INDEPENDENT_AMBULATORY_CARE_PROVIDER_SITE_OTHER): Payer: 59

## 2017-09-03 DIAGNOSIS — M20011 Mallet finger of right finger(s): Secondary | ICD-10-CM | POA: Diagnosis not present

## 2017-09-03 NOTE — Progress Notes (Signed)
Office Visit Note   Patient: Erin [name]           Date of Birth: January 29, 1966           MRN: [id number] Visit Date: 09/03/2017              Requested by: Forrest Moron, MD [place], [place] [address] PCP: Forrest Moron, MD   Assessment & Plan: Visit Diagnoses:  1. Mallet finger of right hand     Plan: Impression is right small mallet finger.  Stack splint was placed today temporarily.  We did give her a referral to OT for extension splinting.  Patient understands that she needs to wear the splint continuously for 8 weeks.  Recheck in 8 weeks.  Questions encouraged and answered.  Follow-Up Instructions: Return in about 8 weeks (around 10/29/2017).   Orders:  Orders Placed This Encounter  Procedures  . XR Finger Little Right   No orders of the defined types were placed in this encounter.     Procedures: No procedures performed   Clinical Data: No additional findings.   Subjective: Chief Complaint  Patient presents with  . Right Hand - Injury, Pain    Erin [name] is a 52 year old female who I work with at the most, outpatient surgical center comes in with acute injury to her right small finger.  She jammed this when she was on a stepladder.  Pain is mild.  Denies any numbness and tingling.   Review of Systems  Constitutional: Negative.   HENT: Negative.   Eyes: Negative.   Respiratory: Negative.   Cardiovascular: Negative.   Endocrine: Negative.   Musculoskeletal: Negative.   Neurological: Negative.   Hematological: Negative.   Psychiatric/Behavioral: Negative.   All other systems reviewed and are negative.    Objective: Vital Signs: There were no vitals taken for this visit.  Physical Exam  Constitutional: She is oriented to person, place, and time. She appears well-developed and well-nourished.  HENT:  Head: Normocephalic and atraumatic.  Eyes: EOM are normal.  Neck: Neck supple.  Pulmonary/Chest: Effort normal.  Abdominal:  Soft.  Neurological: She is alert and oriented to person, place, and time.  Skin: Skin is warm. Capillary refill takes less than 2 seconds.  Psychiatric: She has a normal mood and affect. Her behavior is normal. Judgment and thought content normal.  Nursing note and vitals reviewed.   Ortho Exam Right small finger shows extensor lag with fully extend her DIP joint.  Flexor tendon function intact.  Central slip intact.  Collateral ligaments are intact. Specialty Comments:  No specialty comments available.  Imaging: Xr Finger Little Right  Result Date: 09/03/2017 No acute or structural abnormalities.    PMFS History: Patient Active Problem List   Diagnosis Date Noted  . Mallet finger of right hand 09/03/2017  . Anterolisthesis 04/17/2017  . 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 Degenerative disc disease, lumbar 04/17/2017  . Class 2 obesity with body mass index (BMI) of 35.0 to 35.9 in adult 06/01/2016  . Diabetes ([place]) 05/27/2016  . HTN (hypertension) 04/13/2016  . Menorrhagia 08/26/2015    Class: Present on Admission   Past Medical History:  Diagnosis Date  . Anxiety   . Depression   . GERD (gastroesophageal reflux disease)   . Menorrhagia   . OSA on CPAP   . Sleep apnea   . Type 2 diabetes mellitus ([place])   . Wears glasses     Family History  Problem Relation  Age of Onset  . Stroke Mother   . Diabetes Mother   . Colon cancer Neg Hx   . Esophageal cancer Neg Hx   . Stomach cancer Neg Hx     Past Surgical History:  Procedure Laterality Date  . DILITATION & CURRETTAGE/HYSTROSCOPY WITH NOVASURE ABLATION N/A 08/26/2015   Procedure: DILATATION & CURETTAGE/HYSTEROSCOPY WITH NOVASURE ABLATION;  Surgeon: [name], MD;  Location: [facility];  Service: Gynecology;  Laterality: N/A;  . EXCISIONAL LEFT BREAST BX  11-14-2007   benign  . LAPAROSCOPIC TUBAL LIGATION Bilateral 08/26/2015   Procedure: LAPAROSCOPIC TUBAL LIGATION;  Surgeon: [name], MD;   Location: [facility];  Service: Gynecology;  Laterality: Bilateral;  . THYROID LOBECTOMY Right 04-30-2008   Social History   Occupational History  . Not on file  Tobacco Use  . Smoking status: Former Smoker    Years: 15.00    Types: Cigarettes  . Smokeless tobacco: Never Used  Substance and Sexual Activity  . Alcohol use: No  . Drug use: No  . Sexual activity: Never

## 2017-09-09 ENCOUNTER — Other Ambulatory Visit: Payer: Self-pay

## 2017-09-09 ENCOUNTER — Ambulatory Visit: Payer: 59 | Attending: Orthopaedic Surgery | Admitting: Occupational Therapy

## 2017-09-09 DIAGNOSIS — M25641 Stiffness of right hand, not elsewhere classified: Secondary | ICD-10-CM | POA: Diagnosis not present

## 2017-09-09 NOTE — Therapy (Signed)
[facility] [address] [place], [place], [address] Phone: [phone]   Fax:  [phone]  Occupational Therapy Evaluation  Patient Details  Name: Erin [name] MRN: [id number] Date of Birth: 08-14-1965 Referring Provider: Dr. Smitty Knudsen   Encounter Date: 09/09/2017  OT End of Session - 09/09/17 1138    Visit Number  1    Number of Visits  3    Date for OT Re-Evaluation  11/09/17    Authorization Type  [facility] [employer]    OT Start Time  1015    OT Stop Time  1115    OT Time Calculation (min)  60 min    Activity Tolerance  Patient tolerated treatment well    Behavior During Therapy  [facility] for tasks assessed/performed       Past Medical History:  Diagnosis Date  . Anxiety   . Depression   . GERD (gastroesophageal reflux disease)   . Menorrhagia   . OSA on CPAP   . Sleep apnea   . Type 2 diabetes mellitus ([place])   . Wears glasses     Past Surgical History:  Procedure Laterality Date  . DILITATION & CURRETTAGE/HYSTROSCOPY WITH NOVASURE ABLATION N/A 08/26/2015   Procedure: DILATATION & CURETTAGE/HYSTEROSCOPY WITH NOVASURE ABLATION;  Surgeon: [name], MD;  Location: [facility];  Service: Gynecology;  Laterality: N/A;  . EXCISIONAL LEFT BREAST BX  11-14-2007   benign  . LAPAROSCOPIC TUBAL LIGATION Bilateral 08/26/2015   Procedure: LAPAROSCOPIC TUBAL LIGATION;  Surgeon: [name], MD;  Location: [facility];  Service: Gynecology;  Laterality: Bilateral;  . THYROID LOBECTOMY Right 04-30-2008    There were no vitals filed for this visit.  Subjective Assessment - 09/09/17 1033    Subjective   It's still a little numb, but it doesn't really hurt (re: small finger Rt hand)     Pertinent History  DM    Currently in Pain?  No/denies        [facility] OT Assessment - 09/09/17 0001      Assessment   Medical Diagnosis  mallet finger 5th digit (Rt hand)    Referring Provider  Dr. Smitty Knudsen    Onset Date/Surgical Date  08/31/17 no surgery    Hand Dominance  Right    Next MD Visit  8 weeks    Prior Therapy  none      Precautions   Precautions  Other (comment)    Precaution Comments  mallet finger splint on at all times excpet hygiene care/prevent skin maceration - Pt to keep small finger DIP joint hyerextended during this time    Required Braces or Orthoses  Other Brace/Splint    Other Brace/Splint  mallet finger splint      Balance Screen   Has the patient fallen in the past 6 months  No    Has the patient had a decrease in activity level because of a fear of falling?   No    Is the patient reluctant to leave their home because of a fear of falling?   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 Educated pt in prevention of skin maceration and importance of letting skin get air 2-3 times a day, but emphasized keeping DIP joint in hyperextension while splint is removed for air to skin, and while cleaning finger and splint. Pt shown how to keep joint in hyperextension while doing this. Pt instructed to keep DIP joint in full extension to hyperextension at ALL times for the next 6-8 weeks. Pt also instructed to keep MP and PIP joint motion and shown how to do with splint ON      Splinting   Splinting  Pt arrived in pre-fab mallet finger splint. Pt instructed she can wear this splint, but also noted beginning  skin maceration. Fabricated and fitted mallet finger splint with DIP joint in slight hyerextension, however, pt instructed that pre-fab splint make work better for certain tasks. Issued splint           OT Education - 09/09/17 1114    Education Details  splint wear and care, hygiene care including prevention of skin maceration    Person(s) Educated  Patient    Methods  Explanation;Demonstration;Handout    Comprehension  Verbalized understanding          OT Long Term Goals - 09/09/17 1142      OT LONG TERM GOAL #1   Title  Independent with splint wear and care    Time  8    Period  Weeks    Status  On-going      OT LONG TERM GOAL #2   Title  Pt to verbalize understanding with hygiene care, prevention of skin maceration, and importance of keeping DIP joint fully extended    Time  8    Period  Weeks    Status  Achieved            Plan - 09/09/17 1140    Clinical Impression Statement  Pt is a 52 y.o. Rt hand dominant female who presents to outpatient rehab s/p mallet finger injury Rt 5th digit (no surgery). Pt reports injury happened on 08/31/17. Pt arrives w/ pre-fab mallet finger splint on w/ referral for splinting purposes and education.     Occupational Profile and client history currently impacting functional performance  PMH: DM    Occupational performance deficits (Please refer to evaluation for details):  ADL's;IADL's;Work    Rehab Potential  Good    OT Frequency  -- 3 visits over 8 weeks prn to adjust/remake splint prn    OT Treatment/Interventions  Splinting;Self-care/ADL training;Therapeutic exercise;Patient/family education    Plan  splinting adjustments prn    Clinical Decision Making  Limited treatment options, no task modification necessary       Patient will benefit from skilled therapeutic intervention in order to improve the following deficits and impairments:  Decreased range of motion, Impaired UE functional use  Visit Diagnosis: Stiffness of  right hand joint - Plan: Ot plan of care cert/re-cert    Problem List Patient Active Problem List   Diagnosis Date Noted  .  Mallet finger of right hand 09/03/2017  . Anterolisthesis 04/17/2017  . 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 Degenerative disc disease, lumbar 04/17/2017  . Class 2 obesity with body mass index (BMI) of 35.0 to 35.9 in adult 06/01/2016  . Diabetes ([place]) 05/27/2016  . HTN (hypertension) 04/13/2016  . Menorrhagia 08/26/2015    Class: Present on Admission    Carey Bullocks, OTR/L 09/09/2017, 11:49 AM  [facility] [address] [place], [place], [address] Phone: [phone]   Fax:  [phone]  Name: Erin [name] MRN: [id number] Date of Birth: [birth year]

## 2017-09-09 NOTE — Patient Instructions (Signed)
  WEARING SCHEDULE:  Wear splint at ALL times except for hygiene care (keep tip joint hyperextended while cleaning finger and splint, also let air get to tip of finger for at least 5 minutes, 2x/day while keeping tip joint hyperextended)  PURPOSE:  To prevent movement and for protection until injury can heal  CARE OF SPLINT:  Keep splint away from heat sources including: stove, radiator or furnace, or a car in sunlight. The splint can melt and will no longer fit you properly  Keep away from pets and children  Clean the splint with rubbing alcohol 1-2 times per day.  * During this time, make sure you also clean your hand as instructed by your therapist and/or perform dressing changes as needed. Then dry hand completely before replacing splint. (When cleaning hand, keep it immobilized in same position until splint is replaced)  PRECAUTIONS/POTENTIAL PROBLEMS: *If you notice or experience increased pain, swelling, numbness, or a lingering reddened area from the splint: Contact your therapist immediately by calling [phone]. You must wear the splint for protection, but we will get you scheduled for adjustments as quickly as possible.  (If only straps or hooks need to be replaced and NO adjustments to the splint need to be made, just call the office ahead and let them know you are coming in)  If you have any medical concerns or signs of infection, please call your doctor immediately

## 2017-09-13 ENCOUNTER — Other Ambulatory Visit: Payer: Self-pay | Admitting: Family Medicine

## 2017-09-13 DIAGNOSIS — E119 Type 2 diabetes mellitus without complications: Secondary | ICD-10-CM

## 2017-09-13 MED FILL — metFORMIN HCL 500 MG TABS: 500 | 90 days supply | Qty: 90 | Fill #0

## 2017-09-23 ENCOUNTER — Telehealth: Payer: Self-pay | Admitting: Family Medicine

## 2017-09-23 NOTE — Telephone Encounter (Signed)
Called pt. To reschedule visit on 01/05/18. Left VM

## 2017-09-27 ENCOUNTER — Encounter: Payer: 59 | Admitting: Internal Medicine

## 2017-09-30 ENCOUNTER — Other Ambulatory Visit: Payer: Self-pay | Admitting: Internal Medicine

## 2017-09-30 MED FILL — CITALOPRAM HBR 20 MG TABLET: 20 | 30 days supply | Qty: 30 | Fill #6

## 2017-10-01 MED FILL — HYDROCHLOROTHIAZIDE 25 MG T: 25 | 90 days supply | Qty: 90 | Fill #0

## 2017-10-04 ENCOUNTER — Other Ambulatory Visit: Payer: Self-pay | Admitting: Internal Medicine

## 2017-10-04 ENCOUNTER — Encounter: Payer: 59 | Admitting: Gastroenterology

## 2017-10-04 MED FILL — OLMESARTAN MEDOXOMIL 40 MG: 40 | 90 days supply | Qty: 90 | Fill #0

## 2017-10-14 ENCOUNTER — Other Ambulatory Visit: Payer: Self-pay

## 2017-10-14 ENCOUNTER — Ambulatory Visit (AMBULATORY_SURGERY_CENTER): Payer: Self-pay | Admitting: *Deleted

## 2017-10-14 VITALS — Ht 63.0 in | Wt 233.0 lb

## 2017-10-14 DIAGNOSIS — Z1211 Encounter for screening for malignant neoplasm of colon: Secondary | ICD-10-CM

## 2017-10-14 MED ORDER — SUPREP BOWEL PREP KIT 17.5-3.13-1.6 GM/177ML PO SOLN: 1.0000 | Freq: Once | ORAL | 0 refills | Status: AC

## 2017-10-14 MED FILL — SUPREP BOWEL PREP KIT: 17.5-3.13-1 | 1 days supply | Qty: 354 | Fill #0

## 2017-10-14 NOTE — Progress Notes (Signed)
No egg or soy allergy known to patient  No issues with past sedation with any surgeries  or procedures, no intubation problems  No diet pills per patient No home 02 use per patient  No blood thinners per patient  Pt denies issues with constipation  No A fib or A flutter  EMMI video sent to pt's e mail  

## 2017-10-18 MED FILL — CARVEDILOL 6.25 MG TABLET: 6.25 | 30 days supply | Qty: 60 | Fill #3

## 2017-10-18 MED FILL — GLYCOPYRROLATE 1 MG TABLET: 1 | 30 days supply | Qty: 60 | Fill #1

## 2017-10-19 ENCOUNTER — Encounter: Payer: Self-pay | Admitting: Gastroenterology

## 2017-10-27 ENCOUNTER — Ambulatory Visit: Payer: 59 | Admitting: Pulmonary Disease

## 2017-10-27 ENCOUNTER — Encounter: Payer: Self-pay | Admitting: Pulmonary Disease

## 2017-10-27 VITALS — BP 138/72 | HR 68 | Ht 64.0 in | Wt 230.8 lb

## 2017-10-27 DIAGNOSIS — G4733 Obstructive sleep apnea (adult) (pediatric): Secondary | ICD-10-CM

## 2017-10-27 DIAGNOSIS — E669 Obesity, unspecified: Secondary | ICD-10-CM

## 2017-10-27 DIAGNOSIS — G4730 Sleep apnea, unspecified: Secondary | ICD-10-CM

## 2017-10-27 NOTE — Progress Notes (Signed)
   Subjective:    Patient ID: Erin [name], female    DOB: [birth year], 52 y.o.   MRN: [id number]  HPI    Review of Systems  Constitutional: Negative for fever and unexpected weight change.  HENT: Negative for congestion, dental problem, ear pain, nosebleeds, postnasal drip, rhinorrhea, sinus pressure, sneezing, sore throat and trouble swallowing.   Eyes: Negative for redness and itching.  Respiratory: Positive for shortness of breath. Negative for cough, chest tightness and wheezing.   Cardiovascular: Negative for palpitations and leg swelling.  Gastrointestinal: Negative for nausea and vomiting.  Genitourinary: Negative for dysuria.  Musculoskeletal: Negative for joint swelling.  Skin: Negative for rash.  Allergic/Immunologic: Negative.  Negative for environmental allergies, food allergies and immunocompromised state.  Neurological: Negative for headaches.  Hematological: Bruises/bleeds easily.  Psychiatric/Behavioral: Negative for dysphoric mood. The patient is not nervous/anxious.        Objective:   Physical Exam        Assessment & Plan:

## 2017-10-27 NOTE — Patient Instructions (Signed)
Will arrange for home sleep study and call to schedule follow up after this is done

## 2017-10-27 NOTE — Progress Notes (Signed)
[facility]  Chief Complaint  Patient presents with  . sleep consult    Pt referred by Dr. Nolon Rod MD for OSA. Pt has cpap machine DME-[facility], been over 5 yrs. Pt is requesting HST, and is in need for new machine.      Constitutional: BP 138/72 (BP Location: Left Arm, Cuff Size: Normal)   Pulse 68   Ht 5' 4" (1.626 m)   Wt 230 lb 12.8 oz (104.7 kg)   SpO2 98%   BMI 39.62 kg/m   History of Present Illness: Erin [name] is a 52 y.o. female with obstructive sleep apnea.  She had a sleep study years ago.  Found to have sleep apnea.  Has same machine for more than 10 yrs.  Has full face mask.  Gets supplies from [facility] [place].  Lost about 100 lbs after she had sleep study.  Weight has gone up some since.  She snores and feels more tired if she falls asleep w/o CPAP.  She goes to bed at midnight on weekdays, and 6 am on weekends.  Gets about 6 to 7 hours sleep.  Doesn't nap.  Sleeps through the night.  Not using anything to help sleep or stay awake.  She denies sleep walking, sleep talking, bruxism, or nightmares.  There is no history of restless legs.  She denies sleep hallucinations, sleep paralysis, or cataplexy.  The Epworth score is 13 out of 24.   Comprehensive Respiratory Exam:  Appearance - well kempt  ENMT - nasal mucosa moist, turbinates clear, midline nasal septum, no dental lesions, no gingival bleeding, no oral exudates, no tonsillar hypertrophy, MP 3, enlarged tongue Neck - no masses, trachea midline, no thyromegaly, no elevation in JVP Respiratory - normal appearance of chest wall, normal respiratory effort w/o accessory muscle use, no dullness on percussion, no wheezing or rales CV - s1s2 regular rate and rhythm, no murmurs, no peripheral edema, radial pulses symmetric GI - soft, non tender, no masses Lymph - no adenopathy noted in neck and axillary areas MSK - normal muscle strength and tone, normal gait Ext - no cyanosis,  clubbing, or joint inflammation noted Skin - no rashes, lesions, or ulcers Neuro - oriented to person, place, and time Psych - normal mood and affect   Assessment/Plan:  Obstructive sleep apnea. - will arrange for home sleep study to assess current status  - if she still has sleep apnea, then will arrange for new auto CPAP set up  Obesity. - discussed how weight can impact sleep and risk for sleep disordered breathing - discussed options to assist with weight loss: combination of diet modification, cardiovascular and strength training exercises  Cardiovascular risk. - had an extensive discussion regarding the adverse health consequences related to untreated sleep disordered breathing - specifically discussed the risks for hypertension, coronary artery disease, cardiac dysrhythmias, cerebrovascular disease, and diabetes - lifestyle modification discussed  Safe driving practices. - discussed how sleep disruption can increase risk of accidents, particularly when driving - safe driving practices were discussed  Therapies for obstructive sleep apnea. - if the sleep study shows significant sleep apnea, then various therapies for treatment were reviewed: CPAP, oral appliance, and surgical interventions    Patient Instructions  Will arrange for home sleep study and call to schedule follow up after this is done    Chesley Mires, MD [place] 10/27/2017, 4:12 PM  Flow Sheet  Sleep tests:  Review of Systems:  Constitutional: Negative for fever and  unexpected weight change.  HENT: Negative for congestion, dental problem, ear pain, nosebleeds, postnasal drip, rhinorrhea, sinus pressure, sneezing, sore throat and trouble swallowing.   Eyes: Negative for redness and itching.  Respiratory: Positive for shortness of breath. Negative for cough, chest tightness and wheezing.   Cardiovascular: Negative for palpitations and leg swelling.  Gastrointestinal: Negative for  nausea and vomiting.  Genitourinary: Negative for dysuria.  Musculoskeletal: Negative for joint swelling.  Skin: Negative for rash.  Allergic/Immunologic: Negative.  Negative for environmental allergies, food allergies and immunocompromised state.  Neurological: Negative for headaches.  Hematological: Bruises/bleeds easily.  Psychiatric/Behavioral: Negative for dysphoric mood. The patient is not nervous/anxious.    Past Medical History: She  has a past medical history of Anxiety, Depression, GERD (gastroesophageal reflux disease), Hyperlipidemia, Hypertension, Menorrhagia, OSA on CPAP, Type 2 diabetes mellitus ([place]), and Wears glasses.  Past Surgical History: She  has a past surgical history that includes Thyroid lobectomy (Right, 04-30-2008); EXCISIONAL LEFT BREAST BX (11-14-2007); Dilatation & currettage/hysteroscopy with novasure ablation (N/A, 08/26/2015); and Laparoscopic tubal ligation (Bilateral, 08/26/2015).  Family History: Her family history includes Diabetes in her mother; Stroke in her mother.  Social History: She  reports that she quit smoking about 18 years ago. Her smoking use included cigarettes. She quit after 15.00 years of use. She has never used smokeless tobacco. She reports that she does not drink alcohol or use drugs.  Medications: Allergies as of 10/27/2017   No Known Allergies     Medication List        Accurate as of 10/27/17  4:12 PM. Always use your most recent med list.          amLODipine 10 MG tablet Commonly known as:  NORVASC Take 0.5 tablets (5 mg total) by mouth daily.   blood glucose meter kit and supplies Use up to 3 times daily as directed. E11.9   carvedilol 6.25 MG tablet Commonly known as:  COREG Take 1 tablet (6.25 mg total) by mouth 2 (two) times daily with a meal.   citalopram 20 MG tablet Commonly known as:  CELEXA Take 20 mg by mouth every morning.   FREESTYLE FREEDOM LITE w/Device Kit E 11.9 Use to check blood glucose once  daily   freestyle lancets E11.9 Use as instructed. Check blood glucose once daily   glucose blood test strip Test blood glucose once a day. E11.9. Freestyle Lite   glycopyrrolate 1 MG tablet Commonly known as:  ROBINUL Take 1 tablet by mouth 2 (two) times daily.   hydrochlorothiazide 25 MG tablet Commonly known as:  HYDRODIURIL Take 1 tablet (25 mg total) by mouth daily.   Lancing Device Misc Use to check blood glucose as needed. E11.9   metFORMIN 500 MG tablet Commonly known as:  GLUCOPHAGE TAKE 1 TABLET BY MOUTH DAILY WITH BREAKFAST   MULTIVITAMIN GUMMIES ADULT PO Take 2 each by mouth daily.   olmesartan 40 MG tablet Commonly known as:  BENICAR TAKE 1 TABLET (40 MG TOTAL) BY MOUTH DAILY.   rosuvastatin 10 MG tablet Commonly known as:  CRESTOR Take 1 tablet (10 mg total) by mouth daily.

## 2017-11-02 ENCOUNTER — Encounter: Payer: Self-pay | Admitting: Gastroenterology

## 2017-11-02 ENCOUNTER — Ambulatory Visit (AMBULATORY_SURGERY_CENTER): Payer: 59 | Admitting: Gastroenterology

## 2017-11-02 VITALS — BP 135/82 | HR 72 | Temp 97.8°F | Resp 12 | Ht 63.0 in | Wt 233.0 lb

## 2017-11-02 DIAGNOSIS — D122 Benign neoplasm of ascending colon: Secondary | ICD-10-CM

## 2017-11-02 DIAGNOSIS — G4733 Obstructive sleep apnea (adult) (pediatric): Secondary | ICD-10-CM | POA: Diagnosis not present

## 2017-11-02 DIAGNOSIS — I10 Essential (primary) hypertension: Secondary | ICD-10-CM | POA: Diagnosis not present

## 2017-11-02 DIAGNOSIS — D123 Benign neoplasm of transverse colon: Secondary | ICD-10-CM

## 2017-11-02 DIAGNOSIS — Z1211 Encounter for screening for malignant neoplasm of colon: Secondary | ICD-10-CM

## 2017-11-02 DIAGNOSIS — E119 Type 2 diabetes mellitus without complications: Secondary | ICD-10-CM | POA: Diagnosis not present

## 2017-11-02 MED ORDER — SODIUM CHLORIDE 0.9 % IV SOLN: 500.0000 mL | Freq: Once | INTRAVENOUS | Status: DC

## 2017-11-02 NOTE — Patient Instructions (Addendum)
YOU HAD AN ENDOSCOPIC PROCEDURE TODAY AT [facility]:   Refer to the procedure report that was given to you for any specific questions about what was found during the examination.  If the procedure report does not answer your questions, please call your gastroenterologist to clarify.  If you requested that your care partner not be given the details of your procedure findings, then the procedure report has been included in a sealed envelope for you to review at your convenience later.  YOU SHOULD EXPECT: Some feelings of bloating in the abdomen. Passage of more gas than usual.  Walking can help get rid of the air that was put into your GI tract during the procedure and reduce the bloating. If you had a lower endoscopy (such as a colonoscopy or flexible sigmoidoscopy) you may notice spotting of blood in your stool or on the toilet paper. If you underwent a bowel prep for your procedure, you may not have a normal bowel movement for a few days.  Please Note:  You might notice some irritation and congestion in your nose or some drainage.  This is from the oxygen used during your procedure.  There is no need for concern and it should clear up in a day or so.  SYMPTOMS TO REPORT IMMEDIATELY:   Following lower endoscopy (colonoscopy or flexible sigmoidoscopy):  Excessive amounts of blood in the stool  Significant tenderness or worsening of abdominal pains  Swelling of the abdomen that is new, acute  Fever of 100F or higher   For urgent or emergent issues, a gastroenterologist can be reached at any hour by calling [phone].   DIET:  We do recommend a small meal at first, but then you may proceed to your regular diet.  Drink plenty of fluids but you should avoid alcoholic beverages for 24 hours.  ACTIVITY:  You should plan to take it easy for the rest of today and you should NOT DRIVE or use heavy machinery until tomorrow (because of the sedation medicines used during the test).     FOLLOW UP: Our staff will call the number listed on your records the next business day following your procedure to check on you and address any questions or concerns that you may have regarding the information given to you following your procedure. If we do not reach you, we will leave a message.  However, if you are feeling well and you are not experiencing any problems, there is no need to return our call.  We will assume that you have returned to your regular daily activities without incident.  If any biopsies were taken you will be contacted by phone or by letter within the next 1-3 weeks.  Please call us at [phone] if you have not heard about the biopsies in 3 weeks.    SIGNATURES/CONFIDENTIALITY: You and/or your care partner have signed paperwork which will be entered into your electronic medical record.  These signatures attest to the fact that that the information above on your After Visit Summary has been reviewed and is understood.  Full responsibility of the confidentiality of this discharge information lies with you and/or your care-partner.    Handout was given to your care partner on polyps. Your blood sugar was 84 in the recovery room. You may resume your current medications today. Await biopsy results. Please call if any questions or concerns.

## 2017-11-02 NOTE — Progress Notes (Signed)
Called to room to assist during endoscopic procedure.  Patient ID and intended procedure confirmed with present staff. Received instructions for my participation in the procedure from the performing physician.  

## 2017-11-02 NOTE — Op Note (Signed)
[place] Patient Name: Erin [name] Procedure Date: 11/02/2017 1:14 PM MRN: [id number] Endoscopist: Thornton Park MD, MD Age: 52 Referring MD:  Date of Birth: 1965-05-19 Gender: Female Account #: [id number] Procedure:                Colonoscopy Indications:              Screening for colorectal malignant neoplasm. No                            known family history of colon cancer or polyps. No                            baseline GI symptoms. Medicines:                See the Anesthesia note for documentation of the                            administered medications Procedure:                Pre-Anesthesia Assessment:                           - Prior to the procedure, a History and Physical                            was performed, and patient medications and                            allergies were reviewed. The patient's tolerance of                            previous anesthesia was also reviewed. The risks                            and benefits of the procedure and the sedation                            options and risks were discussed with the patient.                            All questions were answered, and informed consent                            was obtained. Prior Anticoagulants: The patient has                            taken no previous anticoagulant or antiplatelet                            agents. ASA Grade Assessment: I - A normal, healthy                            patient. After reviewing the risks and benefits,  the patient was deemed in satisfactory condition to                            undergo the procedure.                           After obtaining informed consent, the colonoscope                            was passed under direct vision. Throughout the                            procedure, the patient's blood pressure, pulse, and                            oxygen saturations were monitored continuously.  The                            Colonoscope was introduced through the anus and                            advanced to the the terminal ileum, with                            identification of the appendiceal orifice and IC                            valve. The colonoscopy was performed without                            difficulty. The patient tolerated the procedure                            well. The quality of the bowel preparation was good. Scope In: 1:19:26 PM Scope Out: 1:55:12 PM Scope Withdrawal Time: 0 hours 30 minutes 45 seconds  Total Procedure Duration: 0 hours 35 minutes 46 seconds  Findings:                 The perianal and digital rectal examinations were                            normal.                           Three pedunculated polyps were found in the                            ascending colon. The polyps were 10 to 18 mm in                            size. These polyps were removed with a hot snare.                            Resection and retrieval were complete. Estimated  blood loss was minimal.                           Two semi-pedunculated polyps were found in the                            transverse colon. The polyps were 3 to 7 mm in                            size. These polyps were removed with a cold snare.                            Resection and retrieval were complete. Estimated                            blood loss was minimal.                           The exam was otherwise without abnormality on                            direct and retroflexion views. Complications:            No immediate complications. Estimated blood loss:                            Minimal. Estimated Blood Loss:     Estimated blood loss was minimal. Impression:               - Three 10 to 18 mm polyps in the ascending colon,                            removed with a hot snare. Resected and retrieved.                           - Two 3 to 7 mm  polyps in the transverse colon,                            removed with a cold snare. Resected and retrieved.                           - The examination was otherwise normal on direct                            and retroflexion views. Recommendation:           - Discharge patient to home.                           - Resume previous diet.                           - Continue present medications.                           - Await pathology results.                           -  Repeat colonoscopy in 3 years for surveillance if                            the pathology results reveal adenomas.                            Recommendations will be formalized after the                            pathology has been reviewed. Thornton Park MD, MD 11/02/2017 2:03:44 PM This report has been signed electronically.

## 2017-11-02 NOTE — Progress Notes (Signed)
Report given to PACU, vss 

## 2017-11-02 NOTE — Progress Notes (Signed)
No problems noted in the recovery room. maw 

## 2017-11-03 ENCOUNTER — Telehealth: Payer: Self-pay | Admitting: *Deleted

## 2017-11-03 ENCOUNTER — Telehealth: Payer: Self-pay

## 2017-11-03 NOTE — Telephone Encounter (Signed)
No answer. Number identifier. Message left to call if questions or concerns. We will make another attempt to call later in the day.

## 2017-11-03 NOTE — Telephone Encounter (Signed)
  Follow up Call-  Call back number 11/02/2017  Post procedure Call Back phone  # [phone]  Permission to leave phone message Yes  Some recent data might be hidden     Patient questions:  Do you have a fever, pain , or abdominal swelling? No. Pain Score  0 *  Have you tolerated food without any problems? Yes.    Have you been able to return to your normal activities? Yes.    Do you have any questions about your discharge instructions: Diet   No. Medications  No. Follow up visit  No.  Do you have questions or concerns about your Care? No.  Actions: * If pain score is 4 or above: No action needed, pain <4.

## 2017-11-04 MED FILL — CITALOPRAM HBR 20 MG TABLET: 20 | 30 days supply | Qty: 30 | Fill #7

## 2017-11-09 ENCOUNTER — Encounter: Payer: Self-pay | Admitting: Gastroenterology

## 2017-11-22 MED FILL — ROSUVASTATIN CALCIUM 10 MG: 10 | 90 days supply | Qty: 90 | Fill #2

## 2017-11-22 MED FILL — GLYCOPYRROLATE 1 MG TABLET: 1 | 90 days supply | Qty: 180 | Fill #0

## 2017-11-22 MED FILL — CARVEDILOL 6.25 MG TABLET: 6.25 | 90 days supply | Qty: 180 | Fill #4

## 2017-11-23 DIAGNOSIS — G4733 Obstructive sleep apnea (adult) (pediatric): Secondary | ICD-10-CM | POA: Diagnosis not present

## 2017-11-24 ENCOUNTER — Other Ambulatory Visit: Payer: Self-pay | Admitting: *Deleted

## 2017-11-24 DIAGNOSIS — G4733 Obstructive sleep apnea (adult) (pediatric): Secondary | ICD-10-CM

## 2017-11-25 ENCOUNTER — Encounter: Payer: Self-pay | Admitting: Pulmonary Disease

## 2017-11-25 ENCOUNTER — Telehealth: Payer: Self-pay | Admitting: Pulmonary Disease

## 2017-11-25 DIAGNOSIS — E662 Morbid (severe) obesity with alveolar hypoventilation: Secondary | ICD-10-CM

## 2017-11-25 DIAGNOSIS — G4733 Obstructive sleep apnea (adult) (pediatric): Secondary | ICD-10-CM | POA: Insufficient documentation

## 2017-11-25 HISTORY — DX: Obstructive sleep apnea (adult) (pediatric): G47.33

## 2017-11-25 NOTE — Telephone Encounter (Signed)
Attempted to call patient today regarding results. I did not receive an answer at time of call. I have left a voicemail message for pt to return call. X1  

## 2017-11-25 NOTE — Telephone Encounter (Signed)
HST 11/23/17 >> AHI 5.8, SpO2 low 84%.  Spent 179 min with SpO2 < 89%.   Please let her know that the sleep study showed mild obstructive sleep apnea and low oxygen level at night.  Please arrange for ROV with a nurse practitioner to review treatment options.  She might need an in lab titration study to determine if she needs oxygen at night with CPAP.

## 2017-11-26 NOTE — Telephone Encounter (Signed)
Called and spoke with patient regarding results.  Informed the patient of results and recommendations today. Scheduled appt with TP on 11/29/2017 at 4:15pm Pt verbalized understanding and denied any questions or concerns at this time.  Nothing further needed.

## 2017-11-29 ENCOUNTER — Ambulatory Visit: Payer: 59 | Admitting: Adult Health

## 2017-12-03 ENCOUNTER — Ambulatory Visit: Payer: 59 | Admitting: Adult Health

## 2017-12-16 MED FILL — CITALOPRAM HBR 20 MG TABLET: 20 | 30 days supply | Qty: 30 | Fill #8

## 2018-01-05 ENCOUNTER — Ambulatory Visit: Payer: 59 | Admitting: Family Medicine

## 2018-01-10 ENCOUNTER — Ambulatory Visit
Admission: RE | Admit: 2018-01-10 | Discharge: 2018-01-10 | Disposition: A | Discharge status: Home / Self Care | Payer: 59 | Source: Ambulatory Visit | Attending: Obstetrics and Gynecology | Admitting: Obstetrics and Gynecology

## 2018-01-10 DIAGNOSIS — R921 Mammographic calcification found on diagnostic imaging of breast: Secondary | ICD-10-CM | POA: Diagnosis not present

## 2018-01-20 DIAGNOSIS — Z6839 Body mass index (BMI) 39.0-39.9, adult: Secondary | ICD-10-CM | POA: Diagnosis not present

## 2018-01-20 DIAGNOSIS — Z01419 Encounter for gynecological examination (general) (routine) without abnormal findings: Secondary | ICD-10-CM | POA: Diagnosis not present

## 2018-01-26 ENCOUNTER — Other Ambulatory Visit: Payer: Self-pay | Admitting: Cardiology

## 2018-01-26 ENCOUNTER — Other Ambulatory Visit: Payer: Self-pay | Admitting: Internal Medicine

## 2018-01-26 MED FILL — metFORMIN HCL 500 MG TABS: 500 | 90 days supply | Qty: 90 | Fill #1

## 2018-01-26 MED FILL — CITALOPRAM HBR 20 MG TABLET: 20 | 30 days supply | Qty: 30 | Fill #9

## 2018-01-26 MED FILL — OLMESARTAN MEDOXOMIL 40 MG: 40 | 90 days supply | Qty: 90 | Fill #1

## 2018-01-26 MED FILL — HYDROCHLOROTHIAZIDE 25 MG T: 25 | 90 days supply | Qty: 90 | Fill #1

## 2018-01-27 DIAGNOSIS — A599 Trichomoniasis, unspecified: Secondary | ICD-10-CM | POA: Diagnosis not present

## 2018-01-27 DIAGNOSIS — N760 Acute vaginitis: Secondary | ICD-10-CM | POA: Diagnosis not present

## 2018-01-27 DIAGNOSIS — Z113 Encounter for screening for infections with a predominantly sexual mode of transmission: Secondary | ICD-10-CM | POA: Diagnosis not present

## 2018-01-27 MED FILL — metroNIDAZOLE 500 MG TABS: 500 | 1 days supply | Qty: 4 | Fill #0

## 2018-01-27 MED FILL — AMLODIPINE BESYLATE 10 MG T: 10 | 90 days supply | Qty: 45 | Fill #0

## 2018-01-27 MED FILL — metroNIDAZOLE 0.75 % GEL: 0.75 | 5 days supply | Qty: 70 | Fill #0

## 2018-02-03 MED FILL — CARVEDILOL 6.25 MG TABLET: 6.25 | 30 days supply | Qty: 60 | Fill #0

## 2018-02-24 DIAGNOSIS — A5900 Urogenital trichomoniasis, unspecified: Secondary | ICD-10-CM | POA: Diagnosis not present

## 2018-02-24 DIAGNOSIS — A599 Trichomoniasis, unspecified: Secondary | ICD-10-CM | POA: Diagnosis not present

## 2018-02-24 DIAGNOSIS — N760 Acute vaginitis: Secondary | ICD-10-CM | POA: Diagnosis not present

## 2018-03-09 MED FILL — CITALOPRAM HBR 20 MG TABLET: 20 | 30 days supply | Qty: 30 | Fill #0

## 2018-03-23 MED FILL — ROSUVASTATIN CALCIUM 10 MG: 10 | 90 days supply | Qty: 90 | Fill #3

## 2018-04-13 MED FILL — CITALOPRAM HBR 20 MG TABLET: 20 | 30 days supply | Qty: 30 | Fill #1

## 2018-05-02 ENCOUNTER — Telehealth: Payer: Self-pay | Admitting: Internal Medicine

## 2018-05-02 MED ORDER — AMLODIPINE BESYLATE 5 MG PO TABS: 5.0000 mg | ORAL_TABLET | Freq: Every day | ORAL | 1 refills | Status: DC

## 2018-05-02 NOTE — Telephone Encounter (Signed)
Refilled patient's amlodipine. Called to confirm she is taking 5 mg only.  Last prescription was for 10-mg Had been being followed at PCP for BP and has been only on 5 mg for months.  Patient states pressure is really good.  No recent readings.  Needs follow up scheduled.  Aware we will contact her to arrange for 3-4 months.

## 2018-05-02 NOTE — Telephone Encounter (Signed)
New message     *STAT* If patient is at the pharmacy, call can be transferred to refill team.   1. Which medications need to be refilled? (please list name of each medication and dose if known)  amLODipine (NORVASC) 10 MG tablet Take 0.5 tablets (5 mg total) by mouth daily. Please make yearly appt with Dr. Harrington Challenger for March for future refills. 1st attempt     2. Which pharmacy/location (including street and city if local pharmacy) is medication to be sent to? [facility]    3. Do they need a 30 day or 90 day supply?  [place]

## 2018-05-17 ENCOUNTER — Other Ambulatory Visit: Payer: Self-pay | Admitting: Family Medicine

## 2018-05-17 ENCOUNTER — Other Ambulatory Visit: Payer: Self-pay | Admitting: Internal Medicine

## 2018-05-17 DIAGNOSIS — E119 Type 2 diabetes mellitus without complications: Secondary | ICD-10-CM

## 2018-05-17 MED FILL — HYDROCHLOROTHIAZIDE 25 MG T: 25 | 90 days supply | Qty: 90 | Fill #0

## 2018-05-17 MED FILL — metFORMIN HCL 500 MG TABS: 500 | 60 days supply | Qty: 60 | Fill #0

## 2018-05-17 MED FILL — OLMESARTAN MEDOXOMIL 40 MG: 40 | 90 days supply | Qty: 90 | Fill #0

## 2018-05-17 MED FILL — AMLODIPINE BESYLATE 5 MG TA: 5 | 90 days supply | Qty: 90 | Fill #0

## 2018-05-17 NOTE — Telephone Encounter (Signed)
Requested Prescriptions  Pending Prescriptions Disp Refills  . metFORMIN (GLUCOPHAGE) 500 MG tablet [Pharmacy Med Name: metFORMIN HCL 500 MG TABS 500 TAB] 60 tablet 0    Sig: TAKE 1 TABLET BY MOUTH DAILY WITH BREAKFAST     Endocrinology:  Diabetes - Biguanides Failed - 05/17/2018  2:01 PM      Failed - HBA1C is between 0 and 7.9 and within 180 days    Hemoglobin A1C  Date Value Ref Range Status  05/12/2017 5.5  Final         Failed - Valid encounter within last 6 months    Recent Outpatient Visits          10 months ago OSA on CPAP   [facility] at [facility], Arlie Solomons, MD   1 year ago Essential hypertension   [facility] at [facility], Arlie Solomons, MD   1 year ago Essential hypertension   [facility] at [place], [place] D, PA-C   1 year ago Midline low back pain without sciatica, unspecified chronicity   [facility] at [facility], Gelene Mink, PA-C   1 year ago Left lower quadrant pain   [facility] at [facility], Gelene Mink, PA-C      Future Appointments            In 1 month Pamella Pert, Lilia Argue, MD [facility] at [place], [place] in normal range and within 360 days    Creatinine, Ser  Date Value Ref Range Status  08/02/2017 0.82 0.40 - 1.20 mg/dL Final         Passed - eGFR in normal range and within 360 days    GFR calc Af Amer  Date Value Ref Range Status  04/09/2017 92 >59 mL/min/1.73 Final   GFR calc non Af Amer  Date Value Ref Range Status  04/09/2017 80 >59 mL/min/1.73 Final   GFR  Date Value Ref Range Status  08/02/2017 94.36 >60.00 mL/min Final       pt is > 3 mos. Overdue for office visit; gave a qty of 60 tabs; no refills  to get her to appt. Sched. 07/13/18

## 2018-06-23 MED FILL — GLYCOPYRROLATE 1 MG TABS: 1 | 30 days supply | Qty: 60 | Fill #0

## 2018-07-13 ENCOUNTER — Encounter: Payer: 59 | Admitting: Family Medicine

## 2018-07-27 ENCOUNTER — Other Ambulatory Visit: Payer: Self-pay | Admitting: Internal Medicine

## 2018-07-27 ENCOUNTER — Other Ambulatory Visit: Payer: Self-pay | Admitting: Family Medicine

## 2018-07-27 DIAGNOSIS — E119 Type 2 diabetes mellitus without complications: Secondary | ICD-10-CM

## 2018-07-27 MED FILL — ROSUVASTATIN CALCIUM 10 MG: 10 | 30 days supply | Qty: 30 | Fill #0

## 2018-07-27 MED FILL — GLYCOPYRROLATE 1 MG TABLET: 1 | 30 days supply | Qty: 60 | Fill #0

## 2018-07-27 MED FILL — metFORMIN HCL 500 MG TABS: 500 | 30 days supply | Qty: 30 | Fill #0

## 2018-08-09 ENCOUNTER — Encounter: Payer: Self-pay | Admitting: Family Medicine

## 2018-08-09 ENCOUNTER — Ambulatory Visit (INDEPENDENT_AMBULATORY_CARE_PROVIDER_SITE_OTHER): Payer: 59 | Admitting: Family Medicine

## 2018-08-09 ENCOUNTER — Other Ambulatory Visit: Payer: Self-pay

## 2018-08-09 ENCOUNTER — Encounter: Payer: 59 | Admitting: Family Medicine

## 2018-08-09 VITALS — BP 138/78 | HR 60 | Temp 98.4°F | Resp 16 | Ht 63.0 in | Wt 221.0 lb

## 2018-08-09 DIAGNOSIS — E119 Type 2 diabetes mellitus without complications: Secondary | ICD-10-CM

## 2018-08-09 DIAGNOSIS — Z0001 Encounter for general adult medical examination with abnormal findings: Secondary | ICD-10-CM | POA: Diagnosis not present

## 2018-08-09 DIAGNOSIS — B351 Tinea unguium: Secondary | ICD-10-CM | POA: Diagnosis not present

## 2018-08-09 DIAGNOSIS — Z114 Encounter for screening for human immunodeficiency virus [HIV]: Secondary | ICD-10-CM | POA: Diagnosis not present

## 2018-08-09 DIAGNOSIS — Z0000 Encounter for general adult medical examination without abnormal findings: Secondary | ICD-10-CM

## 2018-08-09 DIAGNOSIS — I10 Essential (primary) hypertension: Secondary | ICD-10-CM | POA: Diagnosis not present

## 2018-08-09 DIAGNOSIS — Z23 Encounter for immunization: Secondary | ICD-10-CM

## 2018-08-09 LAB — POCT URINALYSIS DIP (MANUAL ENTRY)
Blood, UA: NEGATIVE
Glucose, UA: NEGATIVE mg/dL
Ketones, POC UA: NEGATIVE mg/dL
Leukocytes, UA: NEGATIVE
Nitrite, UA: NEGATIVE
Spec Grav, UA: >=1.03 — AB (ref 1.010–1.025)
Urobilinogen, UA: 1 E.U./dL
pH, UA: 5.5 (ref 5.0–8.0)

## 2018-08-09 LAB — POCT GLYCOSYLATED HEMOGLOBIN (HGB A1C): Hemoglobin A1C: 5.6 % (ref 4.0–5.6)

## 2018-08-09 NOTE — Patient Instructions (Addendum)
[place]  Call for appointment for eye exam   If you have lab work done today you will be contacted with your lab results within the next 2 weeks.  If you have not heard from [place] then please contact us. The fastest way to get your results is to register for My Chart.   IF you received an x-ray today, you will receive an invoice from [facility]. Please contact [facility] at [phone] with questions or concerns regarding your invoice.   IF you received labwork today, you will receive an invoice from [facility]. Please contact [facility] at [phone] with questions or concerns regarding your invoice.   Our billing staff will not be able to assist you with questions regarding bills from these companies.  You will be contacted with the lab results as soon as they are available. The fastest way to get your results is to activate your My Chart account. Instructions are located on the last page of this paperwork. If you have not heard from [place] regarding the results in 2 weeks, please contact this office.     Diabetes Mellitus and Standards of Medical Care Managing diabetes (diabetes mellitus) can be complicated. Your diabetes treatment may be managed by a team of health care providers, including:  A physician who specializes in diabetes (endocrinologist).  A nurse practitioner or physician assistant.  Nurses.  A diet and nutrition specialist (registered dietitian).  A certified diabetes educator (CDE).  An exercise specialist.  A pharmacist.  An eye doctor.  A foot specialist (podiatrist).  A dentist.  A primary care provider.  A mental health provider. Your health care providers follow guidelines to help you get the best quality of care. The following schedule is a general guideline for your diabetes management plan. Your health care providers may give you more specific instructions. Physical exams Upon being diagnosed with diabetes  mellitus, and each year after that, your health care provider will ask about your medical and family history. He or she will also do a physical exam. Your exam may include:  Measuring your height, weight, and body mass index (BMI).  Checking your blood pressure. This will be done at every routine medical visit. Your target blood pressure may vary depending on your medical conditions, your age, and other factors.  Thyroid gland exam.  Skin exam.  Screening for damage to your nerves (peripheral neuropathy). This may include checking the pulse in your legs and feet and checking the level of sensation in your hands and feet.  A complete foot exam to inspect the structure and skin of your feet, including checking for cuts, bruises, redness, blisters, sores, or other problems.  Screening for blood vessel (vascular) problems, which may include checking the pulse in your legs and feet and checking your temperature. Blood tests Depending on your treatment plan and your personal needs, you may have the following tests done:  HbA1c (hemoglobin A1c). This test provides information about blood sugar (glucose) control over the previous 2-3 months. It is used to adjust your treatment plan, if needed. This test will be done: ? At least 2 times a year, if you are meeting your treatment goals. ? 4 times a year, if you are not meeting your treatment goals or if treatment goals have changed.  Lipid testing, including total, LDL, and HDL cholesterol and triglyceride levels. ? The goal for LDL is less than 100 mg/dL (5.5 mmol/L). If you are at high risk for complications, the  goal is less than 70 mg/dL (3.9 mmol/L). ? The goal for HDL is 40 mg/dL (2.2 mmol/L) or higher for men and 50 mg/dL (2.8 mmol/L) or higher for women. An HDL cholesterol of 60 mg/dL (3.3 mmol/L) or higher gives some protection against heart disease. ? The goal for triglycerides is less than 150 mg/dL (8.3 mmol/L).  Liver function  tests.  Kidney function tests.  Thyroid function tests. Dental and eye exams  Visit your dentist two times a year.  If you have type 1 diabetes, your health care provider may recommend an eye exam 3-5 years after you are diagnosed, and then once a year after your first exam. ? For children with type 1 diabetes, a health care provider may recommend an eye exam when your child is age 15 or older and has had diabetes for 3-5 years. After the first exam, your child should get an eye exam once a year.  If you have type 2 diabetes, your health care provider may recommend an eye exam as soon as you are diagnosed, and then once a year after your first exam. Immunizations   The yearly flu (influenza) vaccine is recommended for everyone 6 months or older who has diabetes.  The pneumonia (pneumococcal) vaccine is recommended for everyone 2 years or older who has diabetes. If you are 81 or older, you may get the pneumonia vaccine as a series of two separate shots.  The hepatitis B vaccine is recommended for adults shortly after being diagnosed with diabetes.  Adults and children with diabetes should receive all other vaccines according to age-specific recommendations from the Centers for Disease Control and Prevention (CDC). Mental and emotional health Screening for symptoms of eating disorders, anxiety, and depression is recommended at the time of diagnosis and afterward as needed. If your screening shows that you have symptoms (positive screening result), you may need more evaluation and you may work with a mental health care provider. Treatment plan Your treatment plan will be reviewed at every medical visit. You and your health care provider will discuss:  How you are taking your medicines, including insulin.  Any side effects you are experiencing.  Your blood glucose target goals.  The frequency of your blood glucose monitoring.  Lifestyle habits, such as activity level as well as  tobacco, alcohol, and substance use. Diabetes self-management education Your health care provider will assess how well you are monitoring your blood glucose levels and whether you are taking your insulin correctly. He or she may refer you to:  A certified diabetes educator to manage your diabetes throughout your life, starting at diagnosis.  A registered dietitian who can create or review your personal nutrition plan.  An exercise specialist who can discuss your activity level and exercise plan. Summary  Managing diabetes (diabetes mellitus) can be complicated. Your diabetes treatment may be managed by a team of health care providers.  Your health care providers follow guidelines in order to help you get the best quality of care.  Standards of care including having regular physical exams, blood tests, blood pressure monitoring, immunizations, screening tests, and education about how to manage your diabetes.  Your health care providers may also give you more specific instructions based on your individual health. This information is not intended to replace advice given to you by your health care provider. Make sure you discuss any questions you have with your health care provider. Document Released: 11/23/2008 Document Revised: 10/15/2017 Document Reviewed: 10/25/2015 Elsevier Patient Education  2020 [employer]

## 2018-08-09 NOTE — Progress Notes (Signed)
Chief Complaint  Patient presents with  . Annual Exam    cpe no  pap  . Medication Refill    metrformin    Subjective:  Erin [name] is a 53 y.o. female here for a health maintenance visit.  Patient is established pt  Diabetes Mellitus: Patient presents for follow up of diabetes. Patient reports lack of appetite.  She denies hyperglycemia, hypoglycemia , increase appetite, nausea, paresthesia of the feet, polydipsia, polyuria and visual disturbances. Symptoms have stabilized.  Evaluation to date has been included: hemoglobin A1C.  Home sugars: patient does not check sugars. Treatment to date: Continued metformin which has been effective.  Lab Results  Component Value Date   HGBA1C 5.5 05/12/2017     Patient Active Problem List   Diagnosis Date Noted  . OSA (obstructive sleep apnea) 11/25/2017  . Obesity hypoventilation syndrome ([place]) 11/25/2017  . Mallet finger of right hand 09/03/2017  . Anterolisthesis 04/17/2017  . 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[name]) 04/17/2017  . Degenerative disc disease, lumbar 04/17/2017  . Class 2 obesity with body mass index (BMI) of 35.0 to 35.9 in adult 06/01/2016  . Diabetes ([name]) 05/27/2016  . HTN (hypertension) 04/13/2016  . Menorrhagia 08/26/2015    Class: Present on Admission    Past Medical History:  Diagnosis Date  . Anxiety   . Depression   . GERD (gastroesophageal reflux disease)   . Hyperlipidemia   . Hypertension   . Menorrhagia   . OSA (obstructive sleep apnea) 11/25/2017  . OSA on CPAP   . Type 2 diabetes mellitus ([name])   . Wears glasses     Past Surgical History:  Procedure Laterality Date  . BREAST EXCISIONAL BIOPSY    . DILITATION & CURRETTAGE/HYSTROSCOPY WITH NOVASURE ABLATION N/A 08/26/2015   Procedure: DILATATION & CURETTAGE/HYSTEROSCOPY WITH NOVASURE ABLATION;  Surgeon: [name], MD;  Location: [facility];  Service: Gynecology;  Laterality: N/A;  . EXCISIONAL LEFT BREAST BX  11-14-2007   benign  .  LAPAROSCOPIC TUBAL LIGATION Bilateral 08/26/2015   Procedure: LAPAROSCOPIC TUBAL LIGATION;  Surgeon: [name], MD;  Location: [facility];  Service: Gynecology;  Laterality: Bilateral;  . THYROID LOBECTOMY Right 04-30-2008     Outpatient Medications Prior to Visit  Medication Sig Dispense Refill  . amLODipine (NORVASC) 5 MG tablet Take 1 tablet (5 mg total) by mouth daily. 90 tablet 1  . blood glucose meter kit and supplies Use up to 3 times daily as directed. E11.9 1 each 0  . Blood Glucose Monitoring Suppl (FREESTYLE FREEDOM LITE) w/Device KIT E 11.9 Use to check blood glucose once daily 1 each 1  . carvedilol (COREG) 6.25 MG tablet TAKE 1 TABLET BY MOUTH 2 TIMES DAILY WITH A MEAL. 60 tablet 4  . glucose blood (FREESTYLE LITE) test strip Test blood glucose once a day. E11.9. Freestyle Lite 100 each 12  . hydrochlorothiazide (HYDRODIURIL) 25 MG tablet Take 1 tablet (25 mg total) by mouth daily. 90 tablet 1  . Lancet Devices (LANCING DEVICE) MISC Use to check blood glucose as needed. E11.9 1 each 0  . Lancets (FREESTYLE) lancets E11.9 Use as instructed. Check blood glucose once daily 100 each 12  . metFORMIN (GLUCOPHAGE) 500 MG tablet Take 1 tablet (500 mg total) by mouth daily with breakfast. Keep upcoming appt 30 tablet 0  . Multiple Vitamins-Minerals (MULTIVITAMIN GUMMIES ADULT PO) Take 2 each by mouth daily.    [name] olmesartan (BENICAR) 40 MG tablet TAKE 1 TABLET (40 MG TOTAL)  BY MOUTH DAILY. 90 tablet 2  . rosuvastatin (CRESTOR) 10 MG tablet TAKE 1 TABLET (10 MG TOTAL) BY MOUTH DAILY. 30 tablet 0  . citalopram (CELEXA) 20 MG tablet Take 20 mg by mouth every morning.    [name] glycopyrrolate (ROBINUL) 1 MG tablet Take 1 tablet by mouth 2 (two) times daily.  2   Facility-Administered Medications Prior to Visit  Medication Dose Route Frequency Provider Last Rate Last Dose  . betamethasone acetate-betamethasone sodium phosphate (CELESTONE) injection 3 mg  3 mg Intramuscular Once  Edrick Kins, DPM        No Known Allergies   Family History  Problem Relation Age of Onset  . Stroke Mother   . Diabetes Mother   . Colon cancer Neg Hx   . Esophageal cancer Neg Hx   . Stomach cancer Neg Hx   . Colon polyps Neg Hx   . Rectal cancer Neg Hx      Health Habits: Dental Exam: up to date Eye Exam: not up to date Exercise: 3-4 times/week on average Current exercise activities: dancing for 15 minutes at a time Diet: general diet, no ADA diet  Social History   Socioeconomic History  . Marital status: Widowed    Spouse name: Not on file  . Number of children: Not on file  . Years of education: Not on file  . Highest education level: Not on file  Occupational History  . Not on file  Social Needs  . Financial resource strain: Not on file  . Food insecurity    Worry: Not on file    Inability: Not on file  . Transportation needs    Medical: Not on file    Non-medical: Not on file  Tobacco Use  . Smoking status: Former Smoker    Years: 15.00    Types: Cigarettes    Quit date: 2001    Years since quitting: 19.5  . Smokeless tobacco: Never Used  Substance and Sexual Activity  . Alcohol use: No  . Drug use: No  . Sexual activity: Never  Lifestyle  . Physical activity    Days per week: Not on file    Minutes per session: Not on file  . Stress: Not on file  Relationships  . Social Herbalist on phone: Not on file    Gets together: Not on file    Attends religious service: Not on file    Active member of club or organization: Not on file    Attends meetings of clubs or organizations: Not on file    Relationship status: Not on file  . Intimate partner violence    Fear of current or ex partner: Not on file    Emotionally abused: Not on file    Physically abused: Not on file    Forced sexual activity: Not on file  Other Topics Concern  . Not on file  Social History Narrative  . Not on file   Social History   Substance and Sexual  Activity  Alcohol Use No   Social History   Tobacco Use  Smoking Status Former Smoker  . Years: 15.00  . Types: Cigarettes  . Quit date: 2001  . Years since quitting: 19.5  Smokeless Tobacco Never Used   Social History   Substance and Sexual Activity  Drug Use No    GYN: Sexual Health Menstrual status: amenorrhea LMP: No LMP recorded. Patient has had an ablation. Last pap smear: see HM section  History of abnormal pap smears:    Health Maintenance: See under health Maintenance activity for review of completion dates as well. Immunization History  Administered Date(s) Administered  . Influenza-Unspecified 11/09/2016  . Pneumococcal Conjugate-13 05/12/2017  . Tdap 05/12/2017      Depression Screen-PHQ2/9 Depression screen [facility] 2/9 08/09/2018 06/30/2017 04/20/2017 04/17/2017 06/01/2016  Decreased Interest 0 0 0 0 0  Down, Depressed, Hopeless 0 0 0 0 0  PHQ - 2 Score 0 0 0 0 0  Altered sleeping - - - - -  Tired, decreased energy - - - - -  Change in appetite - - - - -  Feeling bad or failure about yourself  - - - - -  Trouble concentrating - - - - -  Moving slowly or fidgety/restless - - - - -  Suicidal thoughts - - - - -  PHQ-9 Score - - - - -  Difficult doing work/chores - - - - -       Depression Severity and Treatment Recommendations:  0-4= None  5-9= Mild / Treatment: Support, educate to call if worse; return in one month  10-14= Moderate / Treatment: Support, watchful waiting; Antidepressant or Psycotherapy  15-19= Moderately severe / Treatment: Antidepressant OR Psychotherapy  >= 20 = Major depression, severe / Antidepressant AND Psychotherapy    Review of Systems   Review of Systems  Constitutional: Negative for chills, fever and weight loss.  HENT: Negative for congestion, nosebleeds and sinus pain.   Eyes: Negative for blurred vision and double vision.  Cardiovascular: Negative for chest pain, palpitations, orthopnea and claudication.   Gastrointestinal: Negative for abdominal pain, nausea and vomiting.  Genitourinary: Negative for dysuria and urgency.  Skin: Negative for itching and rash.  Neurological: Negative for dizziness, tingling and headaches.    See HPI for ROS as well.  Review of Systems  Constitutional: Negative for activity change, appetite change, chills and fever.  HENT: Negative for congestion, nosebleeds, trouble swallowing and voice change.   Respiratory: Negative for cough, shortness of breath and wheezing.   Gastrointestinal: Negative for diarrhea, nausea and vomiting.  Genitourinary: Negative for difficulty urinating, dysuria, flank pain and hematuria.  Musculoskeletal: Negative for back pain, joint swelling and neck pain.  Neurological: Negative for dizziness, speech difficulty, light-headedness and numbness.  See HPI. All other review of systems negative.    Objective:   Vitals:   08/09/18 1336  BP: (!) 163/82  Pulse: 60  Resp: 16  Temp: 98.4 F (36.9 C)  TempSrc: Oral  SpO2: 100%  Weight: 221 lb (100.2 kg)  Height: '5\' 3"'  (1.6 m)    Body mass index is 39.15 kg/m. Wt Readings from Last 3 Encounters:  08/09/18 221 lb (100.2 kg)  11/02/17 233 lb (105.7 kg)  10/27/17 230 lb 12.8 oz (104.7 kg)    Physical Exam Constitutional:      Appearance: Normal appearance.  HENT:     Head: Normocephalic and atraumatic.     Nose: Nose normal. No congestion.  Eyes:     Extraocular Movements: Extraocular movements intact.     Conjunctiva/sclera: Conjunctivae normal.  Cardiovascular:     Rate and Rhythm: Normal rate and regular rhythm.     Pulses: Normal pulses.     Heart sounds: No murmur.  Pulmonary:     Effort: Pulmonary effort is normal. No respiratory distress.     Breath sounds: Normal breath sounds. No stridor. No wheezing, rhonchi or rales.  Chest:     Chest  wall: No tenderness.     Breasts:        Right: Normal. No swelling, bleeding, inverted nipple, mass, nipple discharge,  skin change or tenderness.        Left: Normal. No swelling, bleeding, inverted nipple, mass, nipple discharge, skin change or tenderness.  Abdominal:     General: Bowel sounds are normal. There is no distension.     Palpations: Abdomen is soft. There is no mass.     Tenderness: There is no abdominal tenderness. There is no right CVA tenderness, left CVA tenderness, guarding or rebound.     Hernia: No hernia is present.  Lymphadenopathy:     Upper Body:     Right upper body: No supraclavicular or axillary adenopathy.     Left upper body: No supraclavicular or axillary adenopathy.  Skin:    General: Skin is warm.     Capillary Refill: Capillary refill takes less than 2 seconds.  Neurological:     General: No focal deficit present.     Mental Status: She is alert and oriented to person, place, and time.  Psychiatric:        Mood and Affect: Mood normal.        Behavior: Behavior normal.        Thought Content: Thought content normal.        Judgment: Judgment normal.     Diabetic Foot Exam - Simple   Simple Foot Form Diabetic Foot exam was performed with the following findings: Yes 08/09/2018  1:55 PM  Visual Inspection No deformities, no ulcerations, no other skin breakdown bilaterally: Yes Sensation Testing Intact to touch and monofilament testing bilaterally: Yes Pulse Check Posterior Tibialis and Dorsalis pulse intact bilaterally: Yes Comments        Assessment/Plan:   Patient was seen for a health maintenance exam.  Counseled the patient on health maintenance issues. Reviewed her health mainteance schedule and ordered appropriate tests (see orders.) Counseled on regular exercise and weight management. Recommend regular eye exams and dental cleaning.   The following issues were addressed today for health maintenance:   Valine was seen today for annual exam and medication refill.  Diagnoses and all orders for this visit:  Encounter for health maintenance  examination in adult- Women's Health Maintenance Plan Advised monthly breast exam and annual mammogram Advised dental exam every six months Discussed stress management Discussed pap smear screening guidelines    Essential hypertension -     POCT urinalysis dipstick -     Lipid panel -     CMP14+EGFR  Well controlled diabetes mellitus ([place]) -     Pneumococcal polysaccharide vaccine 23-valent greater than or equal to 2yo subcutaneous/IM -     POCT glycosylated hemoglobin (Hb A1C) -     HM Diabetes Foot Exam  Screening for HIV (human immunodeficiency virus) -     HIV antibody (with reflex)    No follow-ups on file.    Body mass index is 39.15 kg/m.:  Discussed the patient's BMI with patient. The BMI body mass index is 39.15 kg/m.     No future appointments.  Patient Instructions    [place]  Call for appointment for eye exam   If you have lab work done today you will be contacted with your lab results within the next 2 weeks.  If you have not heard from [place] then please contact us. The fastest way to get your results is to register for My  Chart.   IF you received an x-ray today, you will receive an invoice from [facility]. Please contact [facility] at [phone] with questions or concerns regarding your invoice.   IF you received labwork today, you will receive an invoice from [place]. Please contact [facility] at [phone] with questions or concerns regarding your invoice.   Our billing staff will not be able to assist you with questions regarding bills from these companies.  You will be contacted with the lab results as soon as they are available. The fastest way to get your results is to activate your My Chart account. Instructions are located on the last page of this paperwork. If you have not heard from [place] regarding the results in 2 weeks, please contact this office.

## 2018-08-11 LAB — CMP14+EGFR
ALT: 11 IU/L (ref 0–32)
AST: 13 IU/L (ref 0–40)
Albumin/Globulin Ratio: 1.7 (ref 1.2–2.2)
Albumin: 4.3 g/dL (ref 3.8–4.9)
Alkaline Phosphatase: 73 IU/L (ref 39–117)
BUN/Creatinine Ratio: 13 (ref 9–23)
BUN: 12 mg/dL (ref 6–24)
Bilirubin Total: 0.9 mg/dL (ref 0.0–1.2)
CO2: 24 mmol/L (ref 20–29)
Calcium: 10.1 mg/dL (ref 8.7–10.2)
Chloride: 101 mmol/L (ref 96–106)
Creatinine, Ser: 0.89 mg/dL (ref 0.57–1.00)
GFR calc Af Amer: 86 mL/min/{1.73_m2} (ref >59)
GFR calc non Af Amer: 75 mL/min/{1.73_m2} (ref >59)
Globulin, Total: 2.6 g/dL (ref 1.5–4.5)
Glucose: 91 mg/dL (ref 65–99)
Potassium: 3.4 mmol/L — ABNORMAL LOW (ref 3.5–5.2)
Sodium: 140 mmol/L (ref 134–144)
Total Protein: 6.9 g/dL (ref 6.0–8.5)

## 2018-08-11 LAB — LIPID PANEL
Chol/HDL Ratio: 3.4 ratio (ref 0.0–4.4)
Cholesterol, Total: 131 mg/dL (ref 100–199)
HDL: 39 mg/dL — ABNORMAL LOW (ref >39)
LDL Calculated: 76 mg/dL (ref 0–99)
Triglycerides: 78 mg/dL (ref 0–149)
VLDL Cholesterol Cal: 16 mg/dL (ref 5–40)

## 2018-08-11 LAB — HIV ANTIBODY (ROUTINE TESTING W REFLEX): HIV Screen 4th Generation wRfx: NONREACTIVE

## 2018-08-15 ENCOUNTER — Other Ambulatory Visit: Payer: Self-pay | Admitting: Family Medicine

## 2018-08-15 DIAGNOSIS — E119 Type 2 diabetes mellitus without complications: Secondary | ICD-10-CM

## 2018-08-15 MED ORDER — METFORMIN HCL 500 MG PO TABS: 500.0000 mg | ORAL_TABLET | Freq: Every day | ORAL | 1 refills | Status: DC

## 2018-08-22 ENCOUNTER — Other Ambulatory Visit: Payer: Self-pay | Admitting: Internal Medicine

## 2018-08-22 MED FILL — GLYCOPYRROLATE 1 MG TABLET: 1 | 30 days supply | Qty: 60 | Fill #1

## 2018-08-22 MED FILL — AMLODIPINE BESYLATE 5 MG TA: 5 | 90 days supply | Qty: 90 | Fill #0

## 2018-08-22 MED FILL — HYDROCHLOROTHIAZIDE 25 MG T: 25 | 90 days supply | Qty: 90 | Fill #0

## 2018-08-22 MED FILL — OLMESARTAN MEDOXOMIL 40 MG: 40 | 90 days supply | Qty: 90 | Fill #0

## 2018-08-22 MED FILL — metFORMIN HCL 500 MG TABS: 500 | 90 days supply | Qty: 90 | Fill #0

## 2018-08-22 MED FILL — ROSUVASTATIN CALCIUM 10 MG: 10 | 30 days supply | Qty: 30 | Fill #0

## 2018-08-22 MED FILL — CARVEDILOL 6.25 MG TABLET: 6.25 | 30 days supply | Qty: 60 | Fill #1

## 2018-08-30 ENCOUNTER — Ambulatory Visit (INDEPENDENT_AMBULATORY_CARE_PROVIDER_SITE_OTHER): Payer: 59

## 2018-08-30 ENCOUNTER — Other Ambulatory Visit: Payer: Self-pay

## 2018-08-30 ENCOUNTER — Other Ambulatory Visit: Payer: Self-pay | Admitting: Sports Medicine

## 2018-08-30 ENCOUNTER — Encounter: Payer: Self-pay | Admitting: Sports Medicine

## 2018-08-30 ENCOUNTER — Ambulatory Visit: Payer: 59 | Admitting: Sports Medicine

## 2018-08-30 VITALS — Temp 98.1°F

## 2018-08-30 DIAGNOSIS — M722 Plantar fascial fibromatosis: Secondary | ICD-10-CM

## 2018-08-30 DIAGNOSIS — M79671 Pain in right foot: Secondary | ICD-10-CM | POA: Diagnosis not present

## 2018-08-30 DIAGNOSIS — M2142 Flat foot [pes planus] (acquired), left foot: Secondary | ICD-10-CM

## 2018-08-30 DIAGNOSIS — M2141 Flat foot [pes planus] (acquired), right foot: Secondary | ICD-10-CM

## 2018-08-30 DIAGNOSIS — L603 Nail dystrophy: Secondary | ICD-10-CM

## 2018-08-30 MED ORDER — MELOXICAM 15 MG PO TABS: 15.0000 mg | ORAL_TABLET | Freq: Every day | ORAL | 0 refills | Status: DC

## 2018-08-30 MED ORDER — TRIAMCINOLONE ACETONIDE 10 MG/ML IJ SUSP
10.0000 mg | Freq: Once | INTRAMUSCULAR | Status: AC
  Administered 2018-08-30: 10 mg

## 2018-08-30 MED FILL — MELOXICAM 15 MG TABLET: 15 | 30 days supply | Qty: 30 | Fill #0

## 2018-08-30 NOTE — Patient Instructions (Signed)
Plantar Fasciitis Rehab Ask your health care provider which exercises are safe for you. Do exercises exactly as told by your health care provider and adjust them as directed. It is normal to feel mild stretching, pulling, tightness, or discomfort as you do these exercises. Stop right away if you feel sudden pain or your pain gets worse. Do not begin these exercises until told by your health care provider. Stretching and range-of-motion exercises These exercises warm up your muscles and joints and improve the movement and flexibility of your foot. These exercises also help to relieve pain. Plantar fascia stretch  1. Sit with your left / right leg crossed over your opposite knee. 2. Hold your heel with one hand with that thumb near your arch. With your other hand, hold your toes and gently pull them back toward the top of your foot. You should feel a stretch on the bottom of your toes or your foot (plantar fascia) or both. 3. Hold this stretch for__________ seconds. 4. Slowly release your toes and return to the starting position. Repeat __________ times. Complete this exercise __________ times a day. Gastrocnemius stretch, standing This exercise is also called a calf (gastroc) stretch. It stretches the muscles in the back of the upper calf. 1. Stand with your hands against a wall. 2. Extend your left / right leg behind you, and bend your front knee slightly. 3. Keeping your heels on the floor and your back knee straight, shift your weight toward the wall. Do not arch your back. You should feel a gentle stretch in your upper left / right calf. 4. Hold this position for __________ seconds. Repeat __________ times. Complete this exercise __________ times a day. Soleus stretch, standing This exercise is also called a calf (soleus) stretch. It stretches the muscles in the back of the lower calf. 1. Stand with your hands against a wall. 2. Extend your left / right leg behind you, and bend your front  knee slightly. 3. Keeping your heels on the floor, bend your back knee and shift your weight slightly over your back leg. You should feel a gentle stretch deep in your lower calf. 4. Hold this position for __________ seconds. Repeat __________ times. Complete this exercise __________ times a day. Gastroc and soleus stretch, standing step This exercise stretches the muscles in the back of the lower leg. These muscles are in the upper calf (gastrocnemius) and the lower calf (soleus). 1. Stand with the ball of your left / right foot on a step. The ball of your foot is on the walking surface, right under your toes. 2. Keep your other foot firmly on the same step. 3. Hold on to the wall or a railing for balance. 4. Slowly lift your other foot, allowing your body weight to press your left / right heel down over the edge of the step. You should feel a stretch in your left / right calf. 5. Hold this position for __________ seconds. 6. Return both feet to the step. 7. Repeat this exercise with a slight bend in your left / right knee. Repeat __________ times with your left / right knee straight and __________ times with your left / right knee bent. Complete this exercise __________ times a day. Balance exercise This exercise builds your balance and strength control of your arch to help take pressure off your plantar fascia. Single leg stand If this exercise is too easy, you can try it with your eyes closed or while standing on a pillow. 1.   Without shoes, stand near a railing or in a doorway. You may hold on to the railing or door frame as needed. 2. Stand on your left / right foot. Keep your big toe down on the floor and try to keep your arch lifted. Do not let your foot roll inward. 3. Hold this position for __________ seconds. Repeat __________ times. Complete this exercise __________ times a day. This information is not intended to replace advice given to you by your health care provider. Make sure  you discuss any questions you have with your health care provider. Document Released: 01/26/2005 Document Revised: 05/19/2018 Document Reviewed: 11/24/2017 Elsevier Patient Education  2020 [employer]  

## 2018-08-30 NOTE — Progress Notes (Signed)
Subjective: Erin [name] is a 53 y.o. female patient presents to office with complaint of moderate heel pain on the right that sometimes can get as painful as 10 out of 10 patient reports that it has been going on for the last 3 to 4 weeks and reports that pain is worse after she has been standing and walking for some significant time with pain in the heel and arch patient admits some localized swelling but denies warmth redness and admits that she has tried an over-the-counter shoe cushion that has helped a little bit however still has pretty significant pain on the right.  Patient also admits to some changes that she has noticed over the years to her left second toenail and reports that this nail has become very thick and difficult to trim and wants to discuss any other treatment options for this toenail.  Patient denies nausea vomiting fever chills or any other constitutional symptoms at this time.  Patient denies any other pedal complaints.   Patient Active Problem List   Diagnosis Date Noted  . OSA (obstructive sleep apnea) 11/25/2017  . Obesity hypoventilation syndrome ([place]) 11/25/2017  . Mallet finger of right hand 09/03/2017  . Anterolisthesis 04/17/2017  . 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[place]) 04/17/2017  . Degenerative disc disease, lumbar 04/17/2017  . Class 2 obesity with body mass index (BMI) of 35.0 to 35.9 in adult 06/01/2016  . Diabetes ([place]) 05/27/2016  . HTN (hypertension) 04/13/2016  . Menorrhagia 08/26/2015    Class: Present on Admission    Current Outpatient Medications on File Prior to Visit  Medication Sig Dispense Refill  . amLODipine (NORVASC) 5 MG tablet Take 1 tablet (5 mg total) by mouth daily. 90 tablet 1  . blood glucose meter kit and supplies Use up to 3 times daily as directed. E11.9 1 each 0  . Blood Glucose Monitoring Suppl (FREESTYLE FREEDOM LITE) w/Device KIT E 11.9 Use to check blood glucose once daily 1 each 1  . carvedilol (COREG) 6.25 MG tablet TAKE 1  TABLET BY MOUTH 2 TIMES DAILY WITH A MEAL. 60 tablet 4  . citalopram (CELEXA) 20 MG tablet Take 20 mg by mouth every morning.    [name] glucose blood (FREESTYLE LITE) test strip Test blood glucose once a day. E11.9. Freestyle Lite 100 each 12  . glycopyrrolate (ROBINUL) 1 MG tablet Take 1 tablet by mouth 2 (two) times daily.  2  . hydrochlorothiazide (HYDRODIURIL) 25 MG tablet Take 1 tablet (25 mg total) by mouth daily. 90 tablet 1  . Lancet Devices (LANCING DEVICE) MISC Use to check blood glucose as needed. E11.9 1 each 0  . Lancets (FREESTYLE) lancets E11.9 Use as instructed. Check blood glucose once daily 100 each 12  . metFORMIN (GLUCOPHAGE) 500 MG tablet Take 1 tablet (500 mg total) by mouth daily with breakfast. 90 tablet 1  . Multiple Vitamins-Minerals (MULTIVITAMIN GUMMIES ADULT PO) Take 2 each by mouth daily.    [name] olmesartan (BENICAR) 40 MG tablet Take 1 tablet (40 mg total) by mouth daily. Please keep appt for future refills. 90 tablet 0  . rosuvastatin (CRESTOR) 10 MG tablet Take 1 tablet (10 mg total) by mouth daily. Please keep appt for future refills. 30 tablet 0   Current Facility-Administered Medications on File Prior to Visit  Medication Dose Route Frequency Provider Last Rate Last Dose  . betamethasone acetate-betamethasone sodium phosphate (CELESTONE) injection 3 mg  3 mg Intramuscular Once Edrick Kins, DPM  No Known Allergies  Objective: Physical Exam General: The patient is alert and oriented x3 in no acute distress.  Dermatology: Skin is warm, dry and supple bilateral lower extremities. Nails 1-10 are normal except the left second toenail where there is a very thickening likely secondary to a long second toe that is likely getting pressure from her shoes. There is no erythema, edema, no eccymosis, no open lesions present. Integument is otherwise unremarkable.  Vascular: Dorsalis Pedis pulse and Posterior Tibial pulse are 2/4 bilateral. Capillary fill time is  immediate to all digits.  Neurological: Grossly intact to light touch with an achilles reflex of +2/5 and a  negative Tinel's sign bilateral.  Musculoskeletal: Tenderness to palpation at the medial calcaneal tubercale and through the insertion of the plantar fascia on the right foot. No pain with compression of calcaneus bilateral. No pain with tuning fork to calcaneus bilateral. No pain with calf compression bilateral. There is decreased Ankle joint range of motion bilateral. + Pes planus. All other joints range of motion within normal limits bilateral. Strength 5/5 in all groups bilateral.   Gait: Unassisted, Antalgic avoid weight on right heel  Xray Right foot:  Normal osseous mineralization. Joint spaces preserved except at midtarsal joint supportive of breech and pes planus deformity. No fracture/dislocation/boney destruction. Calcaneal spur present with mild thickening of plantar fascia. No other soft tissue abnormalities or radiopaque foreign bodies.   Assessment and Plan: Problem List Items Addressed This Visit    None    Visit Diagnoses    Plantar fasciitis    -  Primary   Right foot pain       Nail dystrophy       Left second toe   Pes planus of both feet          -Complete examination performed.  -Discussed with patient nail dystrophy at left second toe at this time patient elects to continue with conservative care of self trimming and filing -Xrays reviewed -Discussed with patient in detail the condition of plantar fasciitis on the right, how this occurs and general treatment options. Explained both conservative and surgical treatments.  -After oral consent and aseptic prep, injected a mixture containing 1 ml of 2%  plain lidocaine, 1 ml 0.5% plain marcaine, 0.5 ml of kenalog 10 and 0.5 ml of dexamethasone phosphate into right heel. Post-injection care discussed with patient.  -Rx Meloxicam and advised patient if she still has pain may call for an Rx for PO  Medrol -Recommended good supportive shoes and advised use of OTC insert. Explained to patient that if these orthoses work well, we will continue with these. If these do not improve her condition and  pain, we will consider custom molded orthoses. -Explained and dispensed to patient daily stretching exercises. -Recommend patient to ice affected area 1-2x daily. -Patient to return to office in 4 weeks for follow up or sooner if problems or questions arise.  Landis Martins, DPM

## 2018-09-08 ENCOUNTER — Telehealth: Payer: Self-pay | Admitting: *Deleted

## 2018-09-08 NOTE — Telephone Encounter (Signed)
Pt has answered NO to there following covid19 prescreen questions:      COVID-19 Pre-Screening Questions:  . In the past 7 to 10 days have you had a cough,  shortness of breath, headache, congestion, fever (100 or greater) body aches, chills, sore throat, or sudden loss of taste or sense of smell? . Have you been around anyone with known Covid 19. . Have you been around anyone who is awaiting Covid 19 test results in the past 7 to 10 days? . Have you been around anyone who has been exposed to Covid 19, or has mentioned symptoms of Covid 19 within the past 7 to 10 days?  If you have any concerns/questions about symptoms patients report during screening (either on the phone or at [facility]). Contact the provider seeing the patient or DOD for further guidance.  If neither are available contact a member of the leadership team.

## 2018-09-09 ENCOUNTER — Ambulatory Visit: Payer: 59 | Admitting: Cardiology

## 2018-09-09 ENCOUNTER — Ambulatory Visit: Payer: 59 | Admitting: Internal Medicine

## 2018-09-09 ENCOUNTER — Other Ambulatory Visit: Payer: Self-pay

## 2018-09-09 ENCOUNTER — Encounter: Payer: Self-pay | Admitting: Cardiology

## 2018-09-09 VITALS — BP 138/80 | HR 77 | Ht 63.0 in | Wt 238.8 lb

## 2018-09-09 DIAGNOSIS — E785 Hyperlipidemia, unspecified: Secondary | ICD-10-CM

## 2018-09-09 DIAGNOSIS — I10 Essential (primary) hypertension: Secondary | ICD-10-CM | POA: Diagnosis not present

## 2018-09-09 NOTE — Patient Instructions (Addendum)
Medication Instructions:  Your physician recommends that you continue on your current medications as directed. Please refer to the Current Medication list given to you today.  If you need a refill on your cardiac medications before your next appointment, please call your pharmacy.   Lab work: None  If you have labs (blood work) drawn today and your tests are completely normal, you will receive your results only by:  [facility] (if you have MyChart) OR  A paper copy in the mail If you have any lab test that is abnormal or we need to change your treatment, we will call you to review the results.  Testing/Procedures: None   Follow-Up: At [facility], you and your health needs are our priority.  As part of our continuing mission to provide you with exceptional heart care, we have created designated Provider Care Teams.  These Care Teams include your primary Cardiologist (physician) and Advanced Practice Providers (APPs -  Physician Assistants and Nurse Practitioners) who all work together to provide you with the care you need, when you need it. You will need a follow up appointment in:  12 months.  Please call our office 2 months in advance to schedule this appointment.  You may see Dorris Carnes, MD or one of the following Advanced Practice Providers on your designated Care Team: Richardson Dopp, PA-C [place], [place]  Daune Perch, NP  Any Other Special Instructions Will Be Listed Below (If Applicable).  Lifestyle Modifications to Prevent and Treat Heart Disease -Recommend heart healthy/Mediterranean diet, with whole grains, fruits, vegetable, fish, lean meats, nuts, and olive oil.  -Limit salt. -Recommend moderate walking, 3-5 times/week for 30-50 minutes each session. Aim for at least 150 minutes.week. Goal should be pace of 3 miles/hours, or walking 1.5 miles in 30 minutes -Recommend avoidance of tobacco products. Avoid excess alcohol. -Keep blood pressure well controlled,  ideally less than 130/80.     DASH Eating Plan DASH stands for "Dietary Approaches to Stop Hypertension." The DASH eating plan is a healthy eating plan that has been shown to reduce high blood pressure (hypertension). It may also reduce your risk for type 2 diabetes, heart disease, and stroke. The DASH eating plan may also help with weight loss. What are tips for following this plan?  General guidelines  Avoid eating more than 2,300 mg (milligrams) of salt (sodium) a day. If you have hypertension, you may need to reduce your sodium intake to 1,500 mg a day.  Limit alcohol intake to no more than 1 drink a day for nonpregnant women and 2 drinks a day for men. One drink equals 12 oz of beer, 5 oz of wine, or 1 oz of hard liquor.  Work with your health care provider to maintain a healthy body weight or to lose weight. Ask what an ideal weight is for you.  Get at least 30 minutes of exercise that causes your heart to beat faster (aerobic exercise) most days of the week. Activities may include walking, swimming, or biking.  Work with your health care provider or diet and nutrition specialist (dietitian) to adjust your eating plan to your individual calorie needs. Reading food labels   Check food labels for the amount of sodium per serving. Choose foods with less than 5 percent of the Daily Value of sodium. Generally, foods with less than 300 mg of sodium per serving fit into this eating plan.  To find whole grains, look for the word "whole" as the first word in the  ingredient list. Shopping  Buy products labeled as "low-sodium" or "no salt added."  Buy fresh foods. Avoid canned foods and premade or frozen meals. Cooking  Avoid adding salt when cooking. Use salt-free seasonings or herbs instead of table salt or sea salt. Check with your health care provider or pharmacist before using salt substitutes.  Do not fry foods. Cook foods using healthy methods such as baking, boiling, grilling,  and broiling instead.  Cook with heart-healthy oils, such as olive, canola, soybean, or sunflower oil. Meal planning  Eat a balanced diet that includes: ? 5 or more servings of fruits and vegetables each day. At each meal, try to fill half of your plate with fruits and vegetables. ? Up to 6-8 servings of whole grains each day. ? Less than 6 oz of lean meat, poultry, or fish each day. A 3-oz serving of meat is about the same size as a deck of cards. One egg equals 1 oz. ? 2 servings of low-fat dairy each day. ? A serving of nuts, seeds, or beans 5 times each week. ? Heart-healthy fats. Healthy fats called Omega-3 fatty acids are found in foods such as flaxseeds and coldwater fish, like sardines, salmon, and mackerel.  Limit how much you eat of the following: ? Canned or prepackaged foods. ? Food that is high in trans fat, such as fried foods. ? Food that is high in saturated fat, such as fatty meat. ? Sweets, desserts, sugary drinks, and other foods with added sugar. ? Full-fat dairy products.  Do not salt foods before eating.  Try to eat at least 2 vegetarian meals each week.  Eat more home-cooked food and less restaurant, buffet, and fast food.  When eating at a restaurant, ask that your food be prepared with less salt or no salt, if possible. What foods are recommended? The items listed may not be a complete list. Talk with your dietitian about what dietary choices are best for you. Grains Whole-grain or whole-wheat bread. Whole-grain or whole-wheat pasta. Brown rice. [place]. Bulgur. Whole-grain and low-sodium cereals. Pita bread. Low-fat, low-sodium crackers. Whole-wheat flour tortillas. Vegetables Fresh or frozen vegetables (raw, steamed, roasted, or grilled). Low-sodium or reduced-sodium tomato and vegetable juice. Low-sodium or reduced-sodium tomato sauce and tomato paste. Low-sodium or reduced-sodium canned vegetables. Fruits All fresh, dried, or frozen fruit. Canned  fruit in natural juice (without added sugar). Meat and other protein foods Skinless chicken or [place]. Ground chicken or [place]. Pork with fat trimmed off. Fish and seafood. Egg whites. Dried beans, peas, or lentils. Unsalted nuts, nut butters, and seeds. Unsalted canned beans. Lean cuts of beef with fat trimmed off. Low-sodium, lean deli meat. Dairy Low-fat (1%) or fat-free (skim) milk. Fat-free, low-fat, or reduced-fat cheeses. Nonfat, low-sodium ricotta or cottage cheese. Low-fat or nonfat yogurt. Low-fat, low-sodium cheese. Fats and oils Soft margarine without trans fats. Vegetable oil. Low-fat, reduced-fat, or light mayonnaise and salad dressings (reduced-sodium). Canola, safflower, olive, soybean, and sunflower oils. Avocado. Seasoning and other foods Herbs. Spices. Seasoning mixes without salt. Unsalted popcorn and pretzels. Fat-free sweets. What foods are not recommended? The items listed may not be a complete list. Talk with your dietitian about what dietary choices are best for you. Grains Baked goods made with fat, such as croissants, muffins, or some breads. Dry pasta or rice meal packs. Vegetables Creamed or fried vegetables. Vegetables in a cheese sauce. Regular canned vegetables (not low-sodium or reduced-sodium). Regular canned tomato sauce and paste (not low-sodium or reduced-sodium). Regular tomato  and vegetable juice (not low-sodium or reduced-sodium). Angie Fava. Olives. Fruits Canned fruit in a light or heavy syrup. Fried fruit. Fruit in cream or butter sauce. Meat and other protein foods Fatty cuts of meat. Ribs. Fried meat. Berniece Salines. Sausage. Bologna and other processed lunch meats. Salami. Fatback. Hotdogs. Bratwurst. Salted nuts and seeds. Canned beans with added salt. Canned or smoked fish. Whole eggs or egg yolks. Chicken or [place] with skin. Dairy Whole or 2% milk, cream, and half-and-half. Whole or full-fat cream cheese. Whole-fat or sweetened yogurt. Full-fat cheese.  Nondairy creamers. Whipped toppings. Processed cheese and cheese spreads. Fats and oils Butter. Stick margarine. Lard. Shortening. Ghee. Bacon fat. Tropical oils, such as coconut, palm kernel, or palm oil. Seasoning and other foods Salted popcorn and pretzels. Onion salt, garlic salt, seasoned salt, table salt, and sea salt. Worcestershire sauce. Tartar sauce. Barbecue sauce. Teriyaki sauce. Soy sauce, including reduced-sodium. Steak sauce. Canned and packaged gravies. Fish sauce. Oyster sauce. Cocktail sauce. Horseradish that you find on the shelf. Ketchup. Mustard. Meat flavorings and tenderizers. Bouillon cubes. Hot sauce and Tabasco sauce. Premade or packaged marinades. Premade or packaged taco seasonings. Relishes. Regular salad dressings. Where to find more information:  National Heart, Lung, and [place]: https://wilson-eaton.com/  American Heart Association: www.heart.org Summary  The DASH eating plan is a healthy eating plan that has been shown to reduce high blood pressure (hypertension). It may also reduce your risk for type 2 diabetes, heart disease, and stroke.  With the DASH eating plan, you should limit salt (sodium) intake to 2,300 mg a day. If you have hypertension, you may need to reduce your sodium intake to 1,500 mg a day.  When on the DASH eating plan, aim to eat more fresh fruits and vegetables, whole grains, lean proteins, low-fat dairy, and heart-healthy fats.  Work with your health care provider or diet and nutrition specialist (dietitian) to adjust your eating plan to your individual calorie needs. This information is not intended to replace advice given to you by your health care provider. Make sure you discuss any questions you have with your health care provider. Document Released: 01/15/2011 Document Revised: 01/08/2017 Document Reviewed: 01/20/2016 Elsevier Patient Education  2020 [employer].

## 2018-09-09 NOTE — Progress Notes (Signed)
Cardiology Office Note:    Date:  09/09/2018   ID:  Erin [name], DOB 03-12-1965, MRN [id number]  PCP:  Erin Moron, MD  Cardiologist:  Erin Carnes, MD  Referring MD: Erin Moron, MD   Chief Complaint  Patient presents with  . Follow-up    Hypertension    History of Present Illness:    Erin [name] is a 53 y.o. female with a past medical history significant for diabetes type II, OSA on CPAP, GERD and hypertension. Quit smoking in 2000.  The patient was last seen in the office on 04/09/2017 by Dr. Harrington [name].  Blood pressure was elevated but she had not taken her medications.  She was encouraged on weight loss.   She is here today for follow up hypertension. She reports being compliant with all of her meds. She does admit to eating too much pork. 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 She has no chest discomfort, shortness of breath, orthopnea, PND or edema. She uses CPAP every night.   She was considering wt loss surgery in the past but her husband died from complications after weight loss surgery.   Past Medical History:  Diagnosis Date  . Anxiety   . Depression   . GERD (gastroesophageal reflux disease)   . Hyperlipidemia   . Hypertension   . Menorrhagia   . OSA (obstructive sleep apnea) 11/25/2017  . OSA on CPAP   . Type 2 diabetes mellitus ([place])   . Wears glasses     Past Surgical History:  Procedure Laterality Date  . BREAST EXCISIONAL BIOPSY    . DILITATION & CURRETTAGE/HYSTROSCOPY WITH NOVASURE ABLATION N/A 08/26/2015   Procedure: DILATATION & CURETTAGE/HYSTEROSCOPY WITH NOVASURE ABLATION;  Surgeon: Erin Nigh, MD;  Location: [place];  Service: Gynecology;  Laterality: N/A;  . EXCISIONAL LEFT BREAST BX  11-14-2007   benign  . LAPAROSCOPIC TUBAL LIGATION Bilateral 08/26/2015   Procedure: LAPAROSCOPIC TUBAL LIGATION;  Surgeon: Erin Nigh, MD;  Location: [facility];   Service: Gynecology;  Laterality: Bilateral;  . THYROID LOBECTOMY Right 04-30-2008    Current Medications: Current Meds  Medication Sig  . amLODipine (NORVASC) 5 MG tablet Take 1 tablet (5 mg total) by mouth daily.  . blood glucose meter kit and supplies Use up to 3 times daily as directed. E11.9  . Blood Glucose Monitoring Suppl (FREESTYLE FREEDOM LITE) w/Device KIT E 11.9 Use to check blood glucose once daily  . carvedilol (COREG) 6.25 MG tablet TAKE 1 TABLET BY MOUTH 2 TIMES DAILY WITH A MEAL.  [name] glucose blood (FREESTYLE LITE) test strip Test blood glucose once a day. E11.9. Freestyle Lite  . glycopyrrolate (ROBINUL) 1 MG tablet Take 1 tablet by mouth 2 (two) times daily.  . hydrochlorothiazide (HYDRODIURIL) 25 MG tablet Take 1 tablet (25 mg total) by mouth daily.  Elmore Guise Devices (LANCING DEVICE) MISC Use to check blood glucose as needed. E11.9  . Lancets (FREESTYLE) lancets E11.9 Use as instructed. Check blood glucose once daily  . meloxicam (MOBIC) 15 MG tablet Take 1 tablet (15 mg total) by mouth daily.  . metFORMIN (GLUCOPHAGE) 500 MG tablet Take 1 tablet (500 mg total) by mouth daily with breakfast.  . Multiple Vitamins-Minerals (MULTIVITAMIN GUMMIES ADULT PO) Take 2 each by mouth daily.  [name] olmesartan (BENICAR) 40 MG tablet Take 1 tablet (40 mg total) by mouth daily. Please keep appt for future refills.  [name]  rosuvastatin (CRESTOR) 10 MG tablet Take 1 tablet (10 mg total) by mouth daily. Please keep appt for future refills.   Current Facility-Administered Medications for the 09/09/18 encounter (Office Visit) with Erin Perch, NP  Medication  . betamethasone acetate-betamethasone sodium phosphate (CELESTONE) injection 3 mg     Allergies:   Patient has no known allergies.   Social History   Socioeconomic History  . Marital status: Widowed    Spouse name: Not on file  . Number of children: Not on file  . Years of education: Not on file  . Highest education level: Not on  file  Occupational History  . Not on file  Social Needs  . Financial resource strain: Not on file  . Food insecurity    Worry: Not on file    Inability: Not on file  . Transportation needs    Medical: Not on file    Non-medical: Not on file  Tobacco Use  . Smoking status: Former Smoker    Years: 15.00    Types: Cigarettes    Quit date: 2001    Years since quitting: 19.5  . Smokeless tobacco: Never Used  Substance and Sexual Activity  . Alcohol use: No  . Drug use: No  . Sexual activity: Never  Lifestyle  . Physical activity    Days per week: Not on file    Minutes per session: Not on file  . Stress: Not on file  Relationships  . Social Herbalist on phone: Not on file    Gets together: Not on file    Attends religious service: Not on file    Active member of club or organization: Not on file    Attends meetings of clubs or organizations: Not on file    Relationship status: Not on file  Other Topics Concern  . Not on file  Social History Narrative  . Not on file     Family History: The patient's family history includes Diabetes in her half-sister and mother; Glaucoma in her father; Heart disease in her paternal grandmother; Hypertension in her half-sister and mother; Stroke in her mother. There is no history of Colon cancer, Esophageal cancer, Stomach cancer, Colon polyps, or Rectal cancer. ROS:   Please see the history of present illness.     All other systems reviewed and are negative.  EKGs/Labs/Other Studies Reviewed:    The following studies were reviewed today: none  EKG:  EKG is ordered today.  The ekg ordered today demonstrates NSR, 77 bpm, low voltage. No change from previous.   Recent Labs: 08/09/2018: ALT 11; BUN 12; Creatinine, Ser 0.89; Potassium 3.4; Sodium 140   Recent Lipid Panel    Component Value Date/Time   CHOL 131 08/09/2018 1605   TRIG 78 08/09/2018 1605   HDL 39 (L) 08/09/2018 1605   CHOLHDL 3.4 08/09/2018 1605   CHOLHDL  4.1 CALC 05/20/2006 1135   VLDL 13 05/20/2006 1135   LDLCALC 76 08/09/2018 1605    Physical Exam:    VS:  BP 138/80   Pulse 77   Ht 5' 3" (1.6 m)   Wt 238 lb 12.8 oz (108.3 kg)   SpO2 97%   BMI 42.30 kg/m     Wt Readings from Last 3 Encounters:  09/09/18 238 lb 12.8 oz (108.3 kg)  08/09/18 221 lb (100.2 kg)  11/02/17 233 lb (105.7 kg)     Physical Exam  Constitutional: She is oriented to person, place, and time. She  appears well-developed and well-nourished. No distress.  HENT:  Head: Normocephalic and atraumatic.  Neck: Normal range of motion. Neck supple. No JVD present.  Cardiovascular: Normal rate, regular rhythm and intact distal pulses. Exam reveals no gallop and no friction rub.  Murmur heard.  Systolic murmur is present with a grade of 1/6 at the upper right sternal border and upper left sternal border. Pulmonary/Chest: Effort normal and breath sounds normal. No respiratory distress. She has no wheezes. She has no rales.  Abdominal: Soft. Bowel sounds are normal.  Musculoskeletal: Normal range of motion.        General: No deformity or edema.  Neurological: She is alert and oriented to person, place, and time.  Skin: Skin is warm and dry.  Psychiatric: She has a normal mood and affect. Her behavior is normal. Judgment and thought content normal.  Vitals reviewed.    ASSESSMENT:    1. Essential (primary) hypertension   2. Hyperlipidemia, unspecified hyperlipidemia type   3. Morbid obesity ([place])    PLAN:    In order of problems listed above:  Hypertension -On amlodipine 5 mg daily, carvedilol 6.25 mg, hydrochlorothiazide 25 mg, Benicar 40 mg -BP well controlled, continue current therapy.  -Recent labs with normal renal function, serum creatinine 0.89. -DASH diet  Hyperlipidemia -On rosuvastatin 10 mg daily -Recent lipid panel with LDL of 76.  Good control, continue current therapy.  Obesity -Body mass index is 42.3 kg/m. -Discussed improved diet  and exercise   Medication Adjustments/Labs and Tests Ordered: Current medicines are reviewed at length with the patient today.  Concerns regarding medicines are outlined above. Labs and tests ordered and medication changes are outlined in the patient instructions below:  Patient Instructions  Medication Instructions:  Your physician recommends that you continue on your current medications as directed. Please refer to the Current Medication list given to you today.  If you need a refill on your cardiac medications before your next appointment, please call your pharmacy.   Lab work: None  If you have labs (blood work) drawn today and your tests are completely normal, you will receive your results only by: [name] MyChart Message (if you have MyChart) OR . A paper copy in the mail If you have any lab test that is abnormal or we need to change your treatment, we will call you to review the results.  Testing/Procedures: None   Follow-Up: At [facility], you and your health needs are our priority.  As part of our continuing mission to provide you with exceptional heart care, we have created designated Provider Care Teams.  These Care Teams include your primary Cardiologist (physician) and Advanced Practice Providers (APPs -  Physician Assistants and Nurse Practitioners) who all work together to provide you with the care you need, when you need it. You will need a follow up appointment in:  12 months.  Please call our office 2 months in advance to schedule this appointment.  You may see Erin Carnes, MD or one of the following Advanced Practice Providers on your designated Care Team: Richardson Dopp, PA-C [place], [place] . Erin Perch, NP  Any Other Special Instructions Will Be Listed Below (If Applicable).  Lifestyle Modifications to Prevent and Treat Heart Disease -Recommend heart healthy/Mediterranean diet, with whole grains, fruits, vegetable, fish, lean meats, nuts, and olive oil.  -Limit  salt. -Recommend moderate walking, 3-5 times/week for 30-50 minutes each session. Aim for at least 150 minutes.week. Goal should be pace of 3 miles/hours, or walking  1.5 miles in 30 minutes -Recommend avoidance of tobacco products. Avoid excess alcohol. -Keep blood pressure well controlled, ideally less than 130/80.     DASH Eating Plan DASH stands for "Dietary Approaches to Stop Hypertension." The DASH eating plan is a healthy eating plan that has been shown to reduce high blood pressure (hypertension). It may also reduce your risk for type 2 diabetes, heart disease, and stroke. The DASH eating plan may also help with weight loss. What are tips for following this plan?  General guidelines  Avoid eating more than 2,300 mg (milligrams) of salt (sodium) a day. If you have hypertension, you may need to reduce your sodium intake to 1,500 mg a day.  Limit alcohol intake to no more than 1 drink a day for nonpregnant women and 2 drinks a day for men. One drink equals 12 oz of beer, 5 oz of wine, or 1 oz of hard liquor.  Work with your health care provider to maintain a healthy body weight or to lose weight. Ask what an ideal weight is for you.  Get at least 30 minutes of exercise that causes your heart to beat faster (aerobic exercise) most days of the week. Activities may include walking, swimming, or biking.  Work with your health care provider or diet and nutrition specialist (dietitian) to adjust your eating plan to your individual calorie needs. Reading food labels   Check food labels for the amount of sodium per serving. Choose foods with less than 5 percent of the Daily Value of sodium. Generally, foods with less than 300 mg of sodium per serving fit into this eating plan.  To find whole grains, look for the word "whole" as the first word in the ingredient list. Shopping  Buy products labeled as "low-sodium" or "no salt added."  Buy fresh foods. Avoid canned foods and premade or  frozen meals. Cooking  Avoid adding salt when cooking. Use salt-free seasonings or herbs instead of table salt or sea salt. Check with your health care provider or pharmacist before using salt substitutes.  Do not fry foods. Cook foods using healthy methods such as baking, boiling, grilling, and broiling instead.  Cook with heart-healthy oils, such as olive, canola, soybean, or sunflower oil. Meal planning  Eat a balanced diet that includes: ? 5 or more servings of fruits and vegetables each day. At each meal, try to fill half of your plate with fruits and vegetables. ? Up to 6-8 servings of whole grains each day. ? Less than 6 oz of lean meat, poultry, or fish each day. A 3-oz serving of meat is about the same size as a deck of cards. One egg equals 1 oz. ? 2 servings of low-fat dairy each day. ? A serving of nuts, seeds, or beans 5 times each week. ? Heart-healthy fats. Healthy fats called Omega-3 fatty acids are found in foods such as flaxseeds and coldwater fish, like sardines, salmon, and mackerel.  Limit how much you eat of the following: ? Canned or prepackaged foods. ? Food that is high in trans fat, such as fried foods. ? Food that is high in saturated fat, such as fatty meat. ? Sweets, desserts, sugary drinks, and other foods with added sugar. ? Full-fat dairy products.  Do not salt foods before eating.  Try to eat at least 2 vegetarian meals each week.  Eat more home-cooked food and less restaurant, buffet, and fast food.  When eating at a restaurant, ask that your food be prepared  with less salt or no salt, if possible. What foods are recommended? The items listed may not be a complete list. Talk with your dietitian about what dietary choices are best for you. Grains Whole-grain or whole-wheat bread. Whole-grain or whole-wheat pasta. Brown rice. [place]. Bulgur. Whole-grain and low-sodium cereals. Pita bread. Low-fat, low-sodium crackers. Whole-wheat flour  tortillas. Vegetables Fresh or frozen vegetables (raw, steamed, roasted, or grilled). Low-sodium or reduced-sodium tomato and vegetable juice. Low-sodium or reduced-sodium tomato sauce and tomato paste. Low-sodium or reduced-sodium canned vegetables. Fruits All fresh, dried, or frozen fruit. Canned fruit in natural juice (without added sugar). Meat and other protein foods Skinless chicken or [place]. Ground chicken or [place]. Pork with fat trimmed off. Fish and seafood. Egg whites. Dried beans, peas, or lentils. Unsalted nuts, nut butters, and seeds. Unsalted canned beans. Lean cuts of beef with fat trimmed off. Low-sodium, lean deli meat. Dairy Low-fat (1%) or fat-free (skim) milk. Fat-free, low-fat, or reduced-fat cheeses. Nonfat, low-sodium ricotta or cottage cheese. Low-fat or nonfat yogurt. Low-fat, low-sodium cheese. Fats and oils Soft margarine without trans fats. Vegetable oil. Low-fat, reduced-fat, or light mayonnaise and salad dressings (reduced-sodium). Canola, safflower, olive, soybean, and sunflower oils. Avocado. Seasoning and other foods Herbs. Spices. Seasoning mixes without salt. Unsalted popcorn and pretzels. Fat-free sweets. What foods are not recommended? The items listed may not be a complete list. Talk with your dietitian about what dietary choices are best for you. Grains Baked goods made with fat, such as croissants, muffins, or some breads. Dry pasta or rice meal packs. Vegetables Creamed or fried vegetables. Vegetables in a cheese sauce. Regular canned vegetables (not low-sodium or reduced-sodium). Regular canned tomato sauce and paste (not low-sodium or reduced-sodium). Regular tomato and vegetable juice (not low-sodium or reduced-sodium). Angie Fava. Olives. Fruits Canned fruit in a light or heavy syrup. Fried fruit. Fruit in cream or butter sauce. Meat and other protein foods Fatty cuts of meat. Ribs. Fried meat. Berniece Salines. Sausage. Bologna and other processed lunch meats.  Salami. Fatback. Hotdogs. Bratwurst. Salted nuts and seeds. Canned beans with added salt. Canned or smoked fish. Whole eggs or egg yolks. Chicken or [place] with skin. Dairy Whole or 2% milk, cream, and half-and-half. Whole or full-fat cream cheese. Whole-fat or sweetened yogurt. Full-fat cheese. Nondairy creamers. Whipped toppings. Processed cheese and cheese spreads. Fats and oils Butter. Stick margarine. Lard. Shortening. Ghee. Bacon fat. Tropical oils, such as coconut, palm kernel, or palm oil. Seasoning and other foods Salted popcorn and pretzels. Onion salt, garlic salt, seasoned salt, table salt, and sea salt. Worcestershire sauce. Tartar sauce. Barbecue sauce. Teriyaki sauce. Soy sauce, including reduced-sodium. Steak sauce. Canned and packaged gravies. Fish sauce. Oyster sauce. Cocktail sauce. Horseradish that you find on the shelf. Ketchup. Mustard. Meat flavorings and tenderizers. Bouillon cubes. Hot sauce and Tabasco sauce. Premade or packaged marinades. Premade or packaged taco seasonings. Relishes. Regular salad dressings. Where to find more information:  National Heart, Lung, and [place]: https://wilson-eaton.com/  American Heart Association: www.heart.org Summary  The DASH eating plan is a healthy eating plan that has been shown to reduce high blood pressure (hypertension). It may also reduce your risk for type 2 diabetes, heart disease, and stroke.  With the DASH eating plan, you should limit salt (sodium) intake to 2,300 mg a day. If you have hypertension, you may need to reduce your sodium intake to 1,500 mg a day.  When on the DASH eating plan, aim to eat more fresh fruits and vegetables, whole grains, lean  proteins, low-fat dairy, and heart-healthy fats.  Work with your health care provider or diet and nutrition specialist (dietitian) to adjust your eating plan to your individual calorie needs. This information is not intended to replace advice given to you by your health  care provider. Make sure you discuss any questions you have with your health care provider. Document Released: 01/15/2011 Document Revised: 01/08/2017 Document Reviewed: 01/20/2016 Elsevier Patient Education  2020 [place], Erin Perch, NP  09/09/2018 2:56 PM    [place]

## 2018-09-19 DIAGNOSIS — H52223 Regular astigmatism, bilateral: Secondary | ICD-10-CM | POA: Diagnosis not present

## 2018-09-19 DIAGNOSIS — H524 Presbyopia: Secondary | ICD-10-CM | POA: Diagnosis not present

## 2018-09-19 DIAGNOSIS — H5213 Myopia, bilateral: Secondary | ICD-10-CM | POA: Diagnosis not present

## 2018-09-19 LAB — HM DIABETES EYE EXAM

## 2018-09-27 ENCOUNTER — Other Ambulatory Visit: Payer: Self-pay

## 2018-09-27 ENCOUNTER — Encounter: Payer: Self-pay | Admitting: Sports Medicine

## 2018-09-27 ENCOUNTER — Ambulatory Visit: Payer: 59 | Admitting: Sports Medicine

## 2018-09-27 VITALS — Temp 98.0°F

## 2018-09-27 DIAGNOSIS — M79671 Pain in right foot: Secondary | ICD-10-CM | POA: Diagnosis not present

## 2018-09-27 DIAGNOSIS — M722 Plantar fascial fibromatosis: Secondary | ICD-10-CM

## 2018-09-27 NOTE — Progress Notes (Signed)
Subjective: Erin [name] is a 53 y.o. female returns to office for follow up evaluation after Right heel injection for plantar fasciitis, injection #1 administered 3 weeks ago. Patient states that the injection seems to help her pain; pain is now 0/10 and has decreased in frequency to the area. Patient denies any recent changes in medications or new problems since last visit.   Patient Active Problem List   Diagnosis Date Noted  . OSA (obstructive sleep apnea) 11/25/2017  . Obesity hypoventilation syndrome ([place]) 11/25/2017  . Mallet finger of right hand 09/03/2017  . Anterolisthesis 04/17/2017  . 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[place]) 04/17/2017  . Degenerative disc disease, lumbar 04/17/2017  . Class 2 obesity with body mass index (BMI) of 35.0 to 35.9 in adult 06/01/2016  . Diabetes ([place]) 05/27/2016  . HTN (hypertension) 04/13/2016  . Menorrhagia 08/26/2015    Class: Present on Admission    Current Outpatient Medications on File Prior to Visit  Medication Sig Dispense Refill  . amLODipine (NORVASC) 5 MG tablet Take 1 tablet (5 mg total) by mouth daily. 90 tablet 1  . blood glucose meter kit and supplies Use up to 3 times daily as directed. E11.9 1 each 0  . Blood Glucose Monitoring Suppl (FREESTYLE FREEDOM LITE) w/Device KIT E 11.9 Use to check blood glucose once daily 1 each 1  . carvedilol (COREG) 6.25 MG tablet TAKE 1 TABLET BY MOUTH 2 TIMES DAILY WITH A MEAL. 60 tablet 4  . glucose blood (FREESTYLE LITE) test strip Test blood glucose once a day. E11.9. Freestyle Lite 100 each 12  . glycopyrrolate (ROBINUL) 1 MG tablet Take 1 tablet by mouth 2 (two) times daily.  2  . hydrochlorothiazide (HYDRODIURIL) 25 MG tablet Take 1 tablet (25 mg total) by mouth daily. 90 tablet 1  . Lancet Devices (LANCING DEVICE) MISC Use to check blood glucose as needed. E11.9 1 each 0  . Lancets (FREESTYLE) lancets E11.9 Use as instructed. Check blood glucose once daily 100 each 12  . meloxicam (MOBIC) 15 MG  tablet Take 1 tablet (15 mg total) by mouth daily. 30 tablet 0  . metFORMIN (GLUCOPHAGE) 500 MG tablet Take 1 tablet (500 mg total) by mouth daily with breakfast. 90 tablet 1  . Multiple Vitamins-Minerals (MULTIVITAMIN GUMMIES ADULT PO) Take 2 each by mouth daily.    [name] olmesartan (BENICAR) 40 MG tablet Take 1 tablet (40 mg total) by mouth daily. Please keep appt for future refills. 90 tablet 0  . rosuvastatin (CRESTOR) 10 MG tablet Take 1 tablet (10 mg total) by mouth daily. Please keep appt for future refills. 30 tablet 0   Current Facility-Administered Medications on File Prior to Visit  Medication Dose Route Frequency Provider Last Rate Last Dose  . betamethasone acetate-betamethasone sodium phosphate (CELESTONE) injection 3 mg  3 mg Intramuscular Once Edrick Kins, DPM        No Known Allergies  Objective:   General:  Alert and oriented x 3, in no acute distress  Dermatology: Skin is warm, dry, and supple bilateral. Nails are within normal limits. There is no lower extremity erythema, no eccymosis, no open lesions present bilateral.   Vascular: Dorsalis Pedis and Posterior Tibial pedal pulses are 2/4 bilateral. + hair growth noted bilateral. Capillary Fill Time is 3 seconds in all digits. No varicosities, No edema bilateral lower extremities.   Neurological: Sensation grossly intact to light touch with an achilles reflex of +2 and a  negative Tinel's sign bilateral. Vibratory, sharp/dull, Semmes  Weinstein Monofilament within normal limits.   Musculoskeletal: There is no pain to plantar fascia on right foot. No pain with compression to calcaneus or application of tuning fork. There is decreased Ankle joint range of motion bilateral. All other jointsrange of motion within normal limits bilateral. Strength 5/5 bilateral.   Assessment and Plan: Problem List Items Addressed This Visit    None    Visit Diagnoses    Plantar fasciitis    -  Primary   Right foot pain           -Complete examination performed.  -Previous x-rays reviewed. -Re-Discussed with patient in detail the condition of plantar fasciitis, how this  occurs related to the foot type of the patient and general treatment options. - No re-injection since pain is resolved -Continue with stretching, icing, good supportive shoes, inserts daily.  -Discussed long term care and reocurrence; will closely monitor; if fails to continue to improve will consider other treatment modalities.  -Patient to return to office as needed or sooner if problems or questions arise.  Landis Martins, DPM

## 2018-10-03 ENCOUNTER — Other Ambulatory Visit: Payer: Self-pay | Admitting: Internal Medicine

## 2018-10-03 MED FILL — CARVEDILOL 6.25 MG TABLET: 6.25 | 30 days supply | Qty: 60 | Fill #2

## 2018-10-04 MED FILL — ROSUVASTATIN CALCIUM 10 MG: 10 | 90 days supply | Qty: 90 | Fill #0

## 2018-10-13 ENCOUNTER — Encounter: Payer: Self-pay | Admitting: Family Medicine

## 2018-11-05 ENCOUNTER — Encounter: Payer: Self-pay | Admitting: Family Medicine

## 2018-11-30 ENCOUNTER — Other Ambulatory Visit: Payer: Self-pay | Admitting: Obstetrics and Gynecology

## 2018-11-30 DIAGNOSIS — R921 Mammographic calcification found on diagnostic imaging of breast: Secondary | ICD-10-CM

## 2018-12-12 ENCOUNTER — Other Ambulatory Visit: Payer: Self-pay | Admitting: Internal Medicine

## 2018-12-12 MED FILL — AMLODIPINE BESYLATE 5 MG TA: 5 | 90 days supply | Qty: 90 | Fill #0

## 2018-12-12 MED FILL — metFORMIN HCL 500 MG TABS: 500 | 90 days supply | Qty: 90 | Fill #1

## 2018-12-12 MED FILL — HYDROCHLOROTHIAZIDE 25 MG T: 25 | 90 days supply | Qty: 90 | Fill #0

## 2018-12-12 MED FILL — OLMESARTAN MEDOXOMIL 40 MG: 40 | 90 days supply | Qty: 90 | Fill #0

## 2019-01-12 ENCOUNTER — Other Ambulatory Visit: Payer: Self-pay

## 2019-01-12 ENCOUNTER — Ambulatory Visit
Admission: RE | Admit: 2019-01-12 | Discharge: 2019-01-12 | Disposition: A | Discharge status: Home / Self Care | Payer: 59 | Source: Ambulatory Visit | Attending: Obstetrics and Gynecology | Admitting: Obstetrics and Gynecology

## 2019-01-12 DIAGNOSIS — R921 Mammographic calcification found on diagnostic imaging of breast: Secondary | ICD-10-CM | POA: Diagnosis not present

## 2019-01-23 DIAGNOSIS — Z6841 Body Mass Index (BMI) 40.0 and over, adult: Secondary | ICD-10-CM | POA: Diagnosis not present

## 2019-01-23 DIAGNOSIS — Z01419 Encounter for gynecological examination (general) (routine) without abnormal findings: Secondary | ICD-10-CM | POA: Diagnosis not present

## 2019-03-01 DIAGNOSIS — L74510 Primary focal hyperhidrosis, axilla: Secondary | ICD-10-CM | POA: Diagnosis not present

## 2019-03-01 MED FILL — GLYCOPYRROLATE 1 MG TABLET: 1 | 30 days supply | Qty: 90 | Fill #0

## 2019-03-02 DIAGNOSIS — Z113 Encounter for screening for infections with a predominantly sexual mode of transmission: Secondary | ICD-10-CM | POA: Diagnosis not present

## 2019-03-02 DIAGNOSIS — A5900 Urogenital trichomoniasis, unspecified: Secondary | ICD-10-CM | POA: Diagnosis not present

## 2019-03-02 MED FILL — TINIDAZOLE 500 MG TABS: 500 | 1 days supply | Qty: 4 | Fill #0

## 2019-03-15 DIAGNOSIS — N760 Acute vaginitis: Secondary | ICD-10-CM | POA: Diagnosis not present

## 2019-03-15 DIAGNOSIS — B373 Candidiasis of vulva and vagina: Secondary | ICD-10-CM | POA: Diagnosis not present

## 2019-03-17 ENCOUNTER — Other Ambulatory Visit: Payer: Self-pay | Admitting: Cardiology

## 2019-03-17 ENCOUNTER — Other Ambulatory Visit: Payer: Self-pay | Admitting: Family Medicine

## 2019-03-17 DIAGNOSIS — E119 Type 2 diabetes mellitus without complications: Secondary | ICD-10-CM

## 2019-03-17 MED FILL — HYDROCHLOROTHIAZIDE 25 MG T: 25 | 90 days supply | Qty: 90 | Fill #1

## 2019-03-17 MED FILL — CARVEDILOL 6.25 MG TABLET: 6.25 | 90 days supply | Qty: 180 | Fill #0

## 2019-03-17 MED FILL — AMLODIPINE BESYLATE 5 MG TA: 5 | 90 days supply | Qty: 90 | Fill #1

## 2019-03-17 MED FILL — metFORMIN HCL 500 MG TABS: 500 | 90 days supply | Qty: 90 | Fill #0

## 2019-03-17 MED FILL — ROSUVASTATIN CALCIUM 10 MG: 10 | 90 days supply | Qty: 90 | Fill #1

## 2019-03-17 MED FILL — OLMESARTAN MEDOXOMIL 40 MG: 40 | 90 days supply | Qty: 90 | Fill #1

## 2019-03-17 NOTE — Telephone Encounter (Signed)
Requested medication (s) are due for refill today:yes  Requested medication (s) are on the active medication list: yes  Last refill:  12/12/2018  Future visit scheduled: no  Notes to clinic: no valid encounter within last 6 months    Requested Prescriptions  Pending Prescriptions Disp Refills   metFORMIN (GLUCOPHAGE) 500 MG tablet [Pharmacy Med Name: metFORMIN HCL 500 MG TABS 500 Tablet] 90 tablet 1    Sig: Take 1 tablet (500 mg total) by mouth daily with breakfast.      Endocrinology:  Diabetes - Biguanides Failed - 03/17/2019 11:41 AM      Failed - HBA1C is between 0 and 7.9 and within 180 days    Hemoglobin A1C  Date Value Ref Range Status  08/09/2018 5.6 4.0 - 5.6 % Final          Failed - Valid encounter within last 6 months    Recent Outpatient Visits           7 months ago Encounter for health maintenance examination in adult   Primary Care at [facility], Zoe A, MD   1 year ago OSA on CPAP   Primary Care at [facility], Arlie Solomons, MD   1 year ago Essential hypertension   Primary Care at [facility], [place], MD   1 year ago Essential hypertension   Primary Care at [place], [place] D, PA-C   1 year ago Midline low back pain without sciatica, unspecified chronicity   Primary Care at [facility], [place], PA-C              Passed - Cr in normal range and within 360 days    Creatinine, Ser  Date Value Ref Range Status  08/09/2018 0.89 0.57 - 1.00 mg/dL Final          Passed - eGFR in normal range and within 360 days    GFR calc Af Amer  Date Value Ref Range Status  08/09/2018 86 >59 mL/min/1.73 Final   GFR calc non Af Amer  Date Value Ref Range Status  08/09/2018 75 >59 mL/min/1.73 Final   GFR  Date Value Ref Range Status  08/02/2017 94.36 >60.00 mL/min Final

## 2019-05-23 MED FILL — GLYCOPYRROLATE 1 MG TABLET: 1 | 30 days supply | Qty: 90 | Fill #1

## 2019-05-24 ENCOUNTER — Other Ambulatory Visit: Payer: Self-pay

## 2019-05-24 ENCOUNTER — Telehealth (INDEPENDENT_AMBULATORY_CARE_PROVIDER_SITE_OTHER): Payer: 59 | Admitting: Family Medicine

## 2019-05-24 DIAGNOSIS — Z9989 Dependence on other enabling machines and devices: Secondary | ICD-10-CM | POA: Diagnosis not present

## 2019-05-24 DIAGNOSIS — G4733 Obstructive sleep apnea (adult) (pediatric): Secondary | ICD-10-CM | POA: Diagnosis not present

## 2019-05-24 NOTE — Patient Instructions (Signed)
° ° ° °  If you have lab work done today you will be contacted with your lab results within the next 2 weeks.  If you have not heard from us then please contact us. The fastest way to get your results is to register for My Chart. ° ° °IF you received an x-ray today, you will receive an invoice from [facility]. Please contact [facility] at [phone] with questions or concerns regarding your invoice.  ° °IF you received labwork today, you will receive an invoice from [facility]. Please contact [facility] at [phone] with questions or concerns regarding your invoice.  ° °Our billing staff will not be able to assist you with questions regarding bills from these companies. ° °You will be contacted with the lab results as soon as they are available. The fastest way to get your results is to activate your My Chart account. Instructions are located on the last page of this paperwork. If you have not heard from us regarding the results in 2 weeks, please contact this office. °  ° ° ° °

## 2019-05-24 NOTE — Progress Notes (Signed)
Virtual Visit via Video Note  I connected with Erin [name] on 05/24/19 at  5:20 PM EDT by a video enabled telemedicine application and verified that I am speaking with the correct person using two identifiers.  Location: Patient: work Provider: office   I discussed the limitations of evaluation and management by telemedicine and the availability of in person appointments. The patient expressed understanding and agreed to proceed.  Chief Complaint  Patient presents with  . Sleep Apnea    pt needs a renweal of supplies    History of Present Illness:  OSA on CPAP Pt reports that she is using her CPAP She states that she uses it faithfully  It improves her daytime fatigue She needs new cpap supplies She states that they are getting old and needs to be changed   Past Medical History:  Diagnosis Date  . Anxiety   . Depression   . GERD (gastroesophageal reflux disease)   . Hyperlipidemia   . Hypertension   . Menorrhagia   . OSA (obstructive sleep apnea) 11/25/2017  . OSA on CPAP   . Type 2 diabetes mellitus ([place])   . Wears glasses      Observations/Objective: There were no vitals filed for this visit. Physical Exam  Constitutional: Oriented to person, place, and time. Appears well-developed and well-nourished.  HENT:  Head: Normocephalic and atraumatic.  Eyes: Conjunctivae and EOM are normal.  Pulmonary/Chest: Effort normal   Neurological: Is alert and oriented to person, place, and time.  Skin: Skin is warm. Capillary refill takes less than 2 seconds.  Psychiatric: Has a normal mood and affect. Behavior is normal. Judgment and thought content normal.   Erin [name] was seen today for sleep apnea.  Diagnoses and all orders for this visit:  OSA on CPAP  -  Reordered CPAP supplies  Assessment and Plan:   Follow Up Instructions:    I discussed the assessment and treatment plan with the patient. The patient was provided an opportunity to ask questions and  all were answered. The patient agreed with the plan and demonstrated an understanding of the instructions.   The patient was advised to call back or seek an in-person evaluation if the symptoms worsen or if the condition fails to improve as anticipated.  I provided 15 minutes of non-face-to-face time during this encounter.   Forrest Moron, MD

## 2019-06-02 DIAGNOSIS — G4733 Obstructive sleep apnea (adult) (pediatric): Secondary | ICD-10-CM | POA: Diagnosis not present

## 2019-06-21 ENCOUNTER — Other Ambulatory Visit: Payer: Self-pay

## 2019-06-21 ENCOUNTER — Encounter: Payer: Self-pay | Admitting: Family Medicine

## 2019-06-21 ENCOUNTER — Ambulatory Visit: Payer: 59 | Admitting: Family Medicine

## 2019-06-21 VITALS — BP 156/89 | HR 75 | Temp 97.5°F | Resp 17 | Ht 65.0 in | Wt 287.8 lb

## 2019-06-21 DIAGNOSIS — E119 Type 2 diabetes mellitus without complications: Secondary | ICD-10-CM | POA: Diagnosis not present

## 2019-06-21 DIAGNOSIS — I10 Essential (primary) hypertension: Secondary | ICD-10-CM | POA: Diagnosis not present

## 2019-06-21 DIAGNOSIS — E1165 Type 2 diabetes mellitus with hyperglycemia: Secondary | ICD-10-CM

## 2019-06-21 LAB — POCT GLYCOSYLATED HEMOGLOBIN (HGB A1C): Hemoglobin A1C: 6.5 % — AB (ref 4.0–5.6)

## 2019-06-21 NOTE — Patient Instructions (Addendum)
Wt Readings from Last 3 Encounters:  06/21/19 287 lb 12.8 oz (130.5 kg)  09/09/18 238 lb 12.8 oz (108.3 kg)  08/09/18 221 lb (100.2 kg)      Low Glycemic Foods (20-49)  Breakfast Cereals: All-Bran                All-Bran Fruit ' n Oats Fiber One               Oatmeal (not instant)  Oat bran Fruits and fruit juices: (Limit to 1-2 servings per day) Apples               Apricots (fresh & dried)  Blackberries            Blueberries Cherries                  Cranberries             Peaches                  Pears                       Plums                       Prunes Grapefruit                Raspberries            Strawberries           Tangerine Apple juice             Grapefruit juice Tomato juice  Beans and legumes (fresh-cooked): Black-eyed peas     Butter beans Chick peas              Lentils     Green beans           Lima beans               Kidney beans          Navy beans  Non-starchy vegetables: Asparagus, bok choy, broccoli, cabbage, cauliflower, celery, cucumber, greens, lettuce, mushrooms, peppers, tomatoes, okra, onions, snow peas, spinach, summer squash  Grains: Barley                                Bulgur Rye                                    Wild rice  Nuts and oils : Almonds         Peanuts     Sunflower seeds  Hazelnuts      Pecans          Walnuts Oils that are liquid at room temperature  Dairy, fish, and meat: Milk, skim                         Lowfat cheese Yogurt, lowfat, fruit sugar sweetened Lean red meat                      Fish  Skinless chicken & [place]    Shellfish Moderate Glycemic Foods (50-69)  Breakfast Cereals: Bran Buds  Bran Chex Just Right                            Mini-Wheats Special K         Swiss muesli  Fruits: Banana (under-ripe)             Dates Figs                                      Grapes Kiwi                                      Mango Oranges                                Raisins  Fruit Juices: Cranberry juice                    Orange juice  Beans and legumes: [place]-type baked beans Canned pinto, kidney, or navy beans Green peas  Vegetables: Beets                         Raw Carrots  Sweet potato              Yam Corn on the cob  Breads: Pita (pocket) bread          Oat bran bread Pumpernickel bread           Rye bread Wheat bread, high fiber       Grains: Cornmeal                           Rice, brown   Rice, white                         Couscous Pasta: Macaroni                           Pizza  cheese Raviolimeat filled           Spaghetti, white        Nuts: Cashews                           Macadamia  Snacks: Chocolate                    Ice cream,lowfat  Muffin                               Popcorn High Glycemic Foods (70-100)   Breakfast Cereals: Cheerios                 Corn Chex Corn Flakes            Cream of Wheat Grape Nuts              Grape Nut Flakes Life                 Nutri-Grain       Puffed Rice  Puffed Wheat Rice Chex                 Rice Krispies Shredded Wheat             Team Total Fruits: Pineapple                 Watermelon Banana (over-ripe)  Beverages: Sodas, sweet tea, pineapple juice  Vegetables: Potato, baked, boiled, fried, mashed [place] fries Canned or frozen corn Cooked carrots Parsnips Winter squash  Breads: Most breads (white and whole grain) Bagels                     Bread sticks Bread stuffing          Kaiser roll Dinner rolls  Grains: Rice, instant          Tapioca, with milk  Candy and most cookies Snacks: Donuts                      Corn chips        Jelly beans                 Pretzels Pastries                             Restaurant and ethnic foods Most [place] food (sugar in stir fry or wok sauces) Teriyaki-style meats and vegetables      If you have lab work done today you will be contacted with your lab results within the next 2 weeks.   If you have not heard from [place] then please contact us. The fastest way to get your results is to register for My Chart.   IF you received an x-ray today, you will receive an invoice from [facility]. Please contact [facility] at [phone] with questions or concerns regarding your invoice.   IF you received labwork today, you will receive an invoice from [facility]. Please contact [facility] at [phone] with questions or concerns regarding your invoice.   Our billing staff will not be able to assist you with questions regarding bills from these companies.  You will be contacted with the lab results as soon as they are available. The fastest way to get your results is to activate your My Chart account. Instructions are located on the last page of this paperwork. If you have not heard from [place] regarding the results in 2 weeks, please contact this office.

## 2019-06-21 NOTE — Progress Notes (Unsigned)
Established Patient Office Visit  Subjective:  Patient ID: Erin [name], female    DOB: Jun 11, 1965  Age: 54 y.o. MRN: [id number]  CC:  Chief Complaint  Patient presents with  . bloodwork and discuss weight loss    HPI Erin [name] presents for   Diabetes Mellitus: Patient presents for follow up of diabetes. Symptoms: {dm sx:14075}. Symptoms have {Desc; symptom progression:19445}. Patient denies {dm sx:14075}.  Evaluation to date has been included: {dm labs:[phone]}.  Home sugars: {dm home sugars:14018}. Treatment to date: {dm interventions:14074}.  Wt Readings from Last 3 Encounters:  06/21/19 287 lb 12.8 oz (130.5 kg)  09/09/18 238 lb 12.8 oz (108.3 kg)  08/09/18 221 lb (100.2 kg)   Lab Results  Component Value Date   HGBA1C 5.6 08/09/2018     Past Medical History:  Diagnosis Date  . Anxiety   . Depression   . GERD (gastroesophageal reflux disease)   . Hyperlipidemia   . Hypertension   . Menorrhagia   . OSA (obstructive sleep apnea) 11/25/2017  . OSA on CPAP   . Type 2 diabetes mellitus ([place])   . Wears glasses     Past Surgical History:  Procedure Laterality Date  . BREAST EXCISIONAL BIOPSY    . DILITATION & CURRETTAGE/HYSTROSCOPY WITH NOVASURE ABLATION N/A 08/26/2015   Procedure: DILATATION & CURETTAGE/HYSTEROSCOPY WITH NOVASURE ABLATION;  Surgeon: [name], MD;  Location: [facility];  Service: Gynecology;  Laterality: N/A;  . EXCISIONAL LEFT BREAST BX  11-14-2007   benign  . LAPAROSCOPIC TUBAL LIGATION Bilateral 08/26/2015   Procedure: LAPAROSCOPIC TUBAL LIGATION;  Surgeon: [name], MD;  Location: [facility];  Service: Gynecology;  Laterality: Bilateral;  . THYROID LOBECTOMY Right 04-30-2008    Family History  Problem Relation Age of Onset  . Stroke Mother   . Diabetes Mother   . Hypertension Mother   . Glaucoma Father   . Heart disease Paternal Grandmother        enlarged heart  . Diabetes  Half-Sister   . Hypertension Half-Sister   . Colon cancer Neg Hx   . Esophageal cancer Neg Hx   . Stomach cancer Neg Hx   . Colon polyps Neg Hx   . Rectal cancer Neg Hx     Social History   Socioeconomic History  . Marital status: Widowed    Spouse name: Not on file  . Number of children: Not on file  . Years of education: Not on file  . Highest education level: Not on file  Occupational History  . Not on file  Tobacco Use  . Smoking status: Former Smoker    Years: 15.00    Types: Cigarettes    Quit date: 2001    Years since quitting: 20.3  . Smokeless tobacco: Never Used  Substance and Sexual Activity  . Alcohol use: No  . Drug use: No  . Sexual activity: Never  Other Topics Concern  . Not on file  Social History Narrative  . Not on file   Social Determinants of Health   Financial Resource Strain:   . Difficulty of Paying Living Expenses:   Food Insecurity:   . Worried About Charity fundraiser in the Last Year:   . Arboriculturist in the Last Year:   Transportation Needs:   . Film/video editor (Medical):   [name] Lack of Transportation (Non-Medical):   Physical Activity:   . Days of Exercise per Week:   .  Minutes of Exercise per Session:   Stress:   . Feeling of Stress :   Social Connections:   . Frequency of Communication with Friends and Family:   . Frequency of Social Gatherings with Friends and Family:   . Attends Religious Services:   . Active Member of Clubs or Organizations:   . Attends Archivist Meetings:   [name] Marital Status:   Intimate Partner Violence:   . Fear of Current or Ex-Partner:   . Emotionally Abused:   [name] Physically Abused:   . Sexually Abused:     Outpatient Medications Prior to Visit  Medication Sig Dispense Refill  . amLODipine (NORVASC) 5 MG tablet TAKE 1 TABLET (5 MG TOTAL) BY MOUTH DAILY. 90 tablet 2  . blood glucose meter kit and supplies Use up to 3 times daily as directed. E11.9 1 each 0  . Blood Glucose  Monitoring Suppl (FREESTYLE FREEDOM LITE) w/Device KIT E 11.9 Use to check blood glucose once daily 1 each 1  . carvedilol (COREG) 6.25 MG tablet TAKE 1 TABLET BY MOUTH 2 TIMES DAILY WITH A MEAL. 180 tablet 1  . glucose blood (FREESTYLE LITE) test strip Test blood glucose once a day. E11.9. Freestyle Lite 100 each 12  . glycopyrrolate (ROBINUL) 1 MG tablet Take 1 tablet by mouth 2 (two) times daily.  2  . hydrochlorothiazide (HYDRODIURIL) 25 MG tablet TAKE 1 TABLET (25 MG TOTAL) BY MOUTH DAILY. 90 tablet 2  . Lancet Devices (LANCING DEVICE) MISC Use to check blood glucose as needed. E11.9 1 each 0  . Lancets (FREESTYLE) lancets E11.9 Use as instructed. Check blood glucose once daily 100 each 12  . metFORMIN (GLUCOPHAGE) 500 MG tablet TAKE 1 TABLET (500 MG TOTAL) BY MOUTH DAILY WITH BREAKFAST. 90 tablet 1  . Multiple Vitamins-Minerals (MULTIVITAMIN GUMMIES ADULT PO) Take 2 each by mouth daily.    [name] olmesartan (BENICAR) 40 MG tablet Take 1 tablet (40 mg total) by mouth daily. 90 tablet 2  . rosuvastatin (CRESTOR) 10 MG tablet TAKE 1 TABLET BY MOUTH DAILY. PLEASE KEEP APPT FOR FUTURE REFILLS. 90 tablet 3  . meloxicam (MOBIC) 15 MG tablet Take 1 tablet (15 mg total) by mouth daily. 30 tablet 0   Facility-Administered Medications Prior to Visit  Medication Dose Route Frequency Provider Last Rate Last Admin  . betamethasone acetate-betamethasone sodium phosphate (CELESTONE) injection 3 mg  3 mg Intramuscular Once Edrick Kins, DPM        No Known Allergies  ROS Review of Systems    Objective:    Physical Exam  BP (!) 156/89 (BP Location: Right Arm, Patient Position: Sitting, Cuff Size: Large)   Pulse 75   Temp (!) 97.5 F (36.4 C) (Temporal)   Resp 17   Ht '5\' 5"'  (1.651 m)   Wt 287 lb 12.8 oz (130.5 kg)   SpO2 98%   BMI 47.89 kg/m  Wt Readings from Last 3 Encounters:  06/21/19 287 lb 12.8 oz (130.5 kg)  09/09/18 238 lb 12.8 oz (108.3 kg)  08/09/18 221 lb (100.2 kg)     Health  Maintenance Due  Topic Date Due  . COVID-19 Vaccine (1) Never done  . HEMOGLOBIN A1C  02/08/2019    There are no preventive care reminders to display for this patient.  Lab Results  Component Value Date   TSH 1.170 05/27/2016   Lab Results  Component Value Date   WBC 6.4 04/09/2017   HGB 12.2 04/09/2017   HCT  36.1 04/09/2017   MCV 93 04/09/2017   PLT 240 04/09/2017   Lab Results  Component Value Date   NA 140 08/09/2018   K 3.4 (L) 08/09/2018   CO2 24 08/09/2018   GLUCOSE 91 08/09/2018   BUN 12 08/09/2018   CREATININE 0.89 08/09/2018   BILITOT 0.9 08/09/2018   ALKPHOS 73 08/09/2018   AST 13 08/09/2018   ALT 11 08/09/2018   PROT 6.9 08/09/2018   ALBUMIN 4.3 08/09/2018   CALCIUM 10.1 08/09/2018   ANIONGAP 7 08/16/2015   GFR 94.36 08/02/2017   Lab Results  Component Value Date   CHOL 131 08/09/2018   Lab Results  Component Value Date   HDL 39 (L) 08/09/2018   Lab Results  Component Value Date   LDLCALC 76 08/09/2018   Lab Results  Component Value Date   TRIG 78 08/09/2018   Lab Results  Component Value Date   CHOLHDL 3.4 08/09/2018   Lab Results  Component Value Date   HGBA1C 5.6 08/09/2018      Assessment & Plan:   Problem List Items Addressed This Visit      Cardiovascular and Mediastinum   HTN (hypertension)     Endocrine   Diabetes ([place]) - Primary      No orders of the defined types were placed in this encounter.   Follow-up: No follow-ups on file.    Forrest Moron, MD

## 2019-06-22 LAB — COMPREHENSIVE METABOLIC PANEL
ALT: 19 IU/L (ref 0–32)
AST: 20 IU/L (ref 0–40)
Albumin/Globulin Ratio: 1.6 (ref 1.2–2.2)
Albumin: 4.5 g/dL (ref 3.8–4.9)
Alkaline Phosphatase: 89 IU/L (ref 39–117)
BUN/Creatinine Ratio: 19 (ref 9–23)
BUN: 22 mg/dL (ref 6–24)
Bilirubin Total: 0.4 mg/dL (ref 0.0–1.2)
CO2: 24 mmol/L (ref 20–29)
Calcium: 10.5 mg/dL — ABNORMAL HIGH (ref 8.7–10.2)
Chloride: 103 mmol/L (ref 96–106)
Creatinine, Ser: 1.13 mg/dL — ABNORMAL HIGH (ref 0.57–1.00)
GFR calc Af Amer: 64 mL/min/{1.73_m2} (ref >59)
GFR calc non Af Amer: 56 mL/min/{1.73_m2} — ABNORMAL LOW (ref >59)
Globulin, Total: 2.8 g/dL (ref 1.5–4.5)
Glucose: 79 mg/dL (ref 65–99)
Potassium: 3.9 mmol/L (ref 3.5–5.2)
Sodium: 140 mmol/L (ref 134–144)
Total Protein: 7.3 g/dL (ref 6.0–8.5)

## 2019-06-22 LAB — LIPID PANEL
Chol/HDL Ratio: 3.1 ratio (ref 0.0–4.4)
Cholesterol, Total: 146 mg/dL (ref 100–199)
HDL: 47 mg/dL (ref >39)
LDL Chol Calc (NIH): 84 mg/dL (ref 0–99)
Triglycerides: 76 mg/dL (ref 0–149)
VLDL Cholesterol Cal: 15 mg/dL (ref 5–40)

## 2019-06-26 ENCOUNTER — Encounter: Payer: 59 | Admitting: Family Medicine

## 2019-06-28 MED FILL — GLYCOPYRROLATE 1 MG TABLET: 1 | 30 days supply | Qty: 90 | Fill #2

## 2019-06-28 MED FILL — ROSUVASTATIN CALCIUM 10 MG: 10 | 90 days supply | Qty: 90 | Fill #2

## 2019-07-25 DIAGNOSIS — I10 Essential (primary) hypertension: Secondary | ICD-10-CM | POA: Diagnosis not present

## 2019-08-10 ENCOUNTER — Encounter: Payer: Self-pay | Admitting: Dietician

## 2019-08-10 ENCOUNTER — Other Ambulatory Visit: Payer: Self-pay

## 2019-08-10 ENCOUNTER — Encounter: Payer: 59 | Attending: Family Medicine | Admitting: Dietician

## 2019-08-10 DIAGNOSIS — E1165 Type 2 diabetes mellitus with hyperglycemia: Secondary | ICD-10-CM | POA: Diagnosis not present

## 2019-08-10 NOTE — Progress Notes (Signed)
Diabetes Self-Management Education  Visit Type: First/Initial  Appt. Start Time: 4:00pm  Appt. End Time: 5:00pm  08/10/2019  Ms. Erin [name], identified by name and date of birth, is a 54 y.o. female with a diagnosis of Diabetes: Type 2.   ASSESSMENT  Weight 297 lb 14.4 oz (135.1 kg). Body mass index is 49.57 kg/m.   Patient states she has a family history of diabetes and obesity. States she works 2 jobs which causes stress and does not allow much time for physical activity. States her current apartment she lives in has a very Engineer, site which discourages her from cooking, so she eats out often. 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 Typical meal pattern is 2 meals per day (breakfast and dinner) plus a snack in between and snacking in the evenings. States she thinks her portion sizes are the biggest issue. States she has tried to watch her snack choices and choose healthier alternatives, but just eats too much of them. Will often eat fried foods when eating out. Primarily drinks water.    Diabetes Self-Management Education - 08/10/19 1616      Visit Information   Visit Type First/Initial      Initial Visit   Diabetes Type Type 2    Are you currently following a meal plan? No    Are you taking your medications as prescribed? Yes    Date Diagnosed 2015      Health Coping   How would you rate your overall health? Good      Psychosocial Assessment   Patient Belief/Attitude about Diabetes Denial    Self-care barriers None    Self-management support Doctor's office    Patient Concerns Nutrition/Meal planning;Weight Control    Special Needs None    Preferred Learning Style No preference indicated    Learning Readiness Contemplating    How often do you need to have someone help you when you read instructions, pamphlets, or other written materials from your doctor or pharmacy? 1 - Never    What is the last grade level you  completed in school? 63OT      Complications   Last HgB A1C per patient/outside source 6.5 %    How often do you check your blood sugar? 0 times/day (not testing)    Have you had a dilated eye exam in the past 12 months? Yes    Have you had a dental exam in the past 12 months? Yes    Are you checking your feet? No      Dietary Intake   Breakfast scrambled eggs + cheese + pancake w/ syrup    Snack (afternoon) chips   or nuts, or peanut butter crackers, or fruit   Dinner hot dog + fries   hamburger + fries, or homemade chicken salad + cracker   Snack (evening) pretzels   or sugar-free jello   Beverage(s) water, occasionally soda      Exercise   Exercise Type ADL's    How many days per week to you exercise? 0    How many minutes per day do you exercise? 0    Total minutes per week of exercise 0      Patient Education   Previous Diabetes Education No    Disease state  Definition of diabetes, type 1 and 2, and the diagnosis of diabetes;Factors that contribute to the development of diabetes;Explored patient's options for treatment of their diabetes  Nutrition management  Role of diet in the treatment of diabetes and the relationship between the three main macronutrients and blood glucose level;Food label reading, portion sizes and measuring food.;Information on hints to eating out and maintain blood glucose control.    Physical activity and exercise  Role of exercise on diabetes management, blood pressure control and cardiac health.    Psychosocial adjustment Worked with patient to identify barriers to care and solutions;Role of stress on diabetes;Identified and addressed patients feelings and concerns about diabetes;Brainstormed with patient on coping mechanisms for social situations, getting support from significant others, dealing with feelings about diabetes      Individualized Goals (developed by patient)   Nutrition General guidelines for healthy choices and portions discussed     Medications take my medication as prescribed      Outcomes   Expected Outcomes Demonstrated limited interest in learning.  Expect minimal changes    Future DMSE PRN           Individualized Plan for Diabetes Self-Management Training:  Learning Objective:  Patient will have a greater understanding of diabetes self-management. Patient education plan is to attend individual and/or group sessions per assessed needs and concerns.  Discussed strategies for blood glucose control and weight management, including the importance/role of exercise, monitoring intake of carbohydrates per meal/snack, incorporating more fiber and lean proteins, continuing to drink lots of water, and controlling portion sizes.   Expected Outcomes:  Demonstrated limited interest in learning.  Expect minimal changes  Education material provided: ADA - How to Thrive: A Guide for Your Journey with Diabetes, My Plate and Snack sheet, Tips for Eating Out with Diabetes, Meal Ideas  If problems or questions, patient to contact team via:  Phone and Email  Future DSME appointment: PRN

## 2019-08-15 MED FILL — PHENTERMINE 37.5 MG TABLET: 37.5 | 30 days supply | Qty: 30 | Fill #0

## 2019-08-15 MED FILL — HYDROCHLOROTHIAZIDE 25 MG T: 25 | 90 days supply | Qty: 90 | Fill #2

## 2019-08-15 MED FILL — GLYCOPYRROLATE 1 MG TABLET: 1 | 30 days supply | Qty: 90 | Fill #3

## 2019-08-15 MED FILL — METFORMIN HCL 500 MG TABS: 500 | 90 days supply | Qty: 90 | Fill #1

## 2019-08-15 MED FILL — OLMESARTAN MEDOXOMIL 40 MG: 40 | 90 days supply | Qty: 90 | Fill #2

## 2019-08-15 MED FILL — CARVEDILOL 6.25 MG TABLET: 6.25 | 90 days supply | Qty: 180 | Fill #1

## 2019-08-15 MED FILL — AMLODIPINE BESYLATE 5 MG TA: 5 | 90 days supply | Qty: 90 | Fill #2

## 2019-08-29 DIAGNOSIS — L74510 Primary focal hyperhidrosis, axilla: Secondary | ICD-10-CM | POA: Diagnosis not present

## 2019-09-19 MED FILL — GLYCOPYRROLATE 1 MG TABLET: 1 | 30 days supply | Qty: 90 | Fill #4

## 2019-09-19 MED FILL — PHENTERMINE 37.5 MG TABLET: 37.5 | 30 days supply | Qty: 30 | Fill #0

## 2019-09-20 DIAGNOSIS — G4733 Obstructive sleep apnea (adult) (pediatric): Secondary | ICD-10-CM | POA: Diagnosis not present

## 2019-09-25 DIAGNOSIS — E119 Type 2 diabetes mellitus without complications: Secondary | ICD-10-CM | POA: Diagnosis not present

## 2019-09-25 DIAGNOSIS — Z83511 Family history of glaucoma: Secondary | ICD-10-CM | POA: Diagnosis not present

## 2019-09-25 DIAGNOSIS — H0012 Chalazion right lower eyelid: Secondary | ICD-10-CM | POA: Diagnosis not present

## 2019-10-10 ENCOUNTER — Other Ambulatory Visit: Payer: Self-pay | Admitting: Internal Medicine

## 2019-10-10 MED FILL — ROSUVASTATIN CALCIUM 10 MG: 10 | 30 days supply | Qty: 30 | Fill #0

## 2019-10-19 MED FILL — TOPIRAMATE 25 MG TAB: 25 | 30 days supply | Qty: 30 | Fill #0

## 2019-10-19 MED FILL — PHENTERMINE 37.5 MG TABLET: 37.5 | 30 days supply | Qty: 30 | Fill #0

## 2019-11-23 ENCOUNTER — Other Ambulatory Visit (HOSPITAL_COMMUNITY): Payer: Self-pay

## 2019-11-23 MED FILL — AMOXICILLIN 500 MG CAPSULE: 500 | 7 days supply | Qty: 28 | Fill #0

## 2020-01-09 DIAGNOSIS — Z113 Encounter for screening for infections with a predominantly sexual mode of transmission: Secondary | ICD-10-CM | POA: Diagnosis not present

## 2020-01-09 DIAGNOSIS — N760 Acute vaginitis: Secondary | ICD-10-CM | POA: Diagnosis not present

## 2020-01-10 ENCOUNTER — Other Ambulatory Visit (HOSPITAL_COMMUNITY): Payer: Self-pay | Admitting: Radiology

## 2020-01-10 MED FILL — metroNIDAZOLE 500 MG TABS: 500 | 1 days supply | Qty: 4 | Fill #0

## 2020-01-27 ENCOUNTER — Ambulatory Visit (INDEPENDENT_AMBULATORY_CARE_PROVIDER_SITE_OTHER): Payer: Worker's Compensation

## 2020-01-27 ENCOUNTER — Encounter (HOSPITAL_COMMUNITY): Payer: Self-pay | Admitting: *Deleted

## 2020-01-27 ENCOUNTER — Ambulatory Visit (HOSPITAL_COMMUNITY)
Admission: EM | Admit: 2020-01-27 | Discharge: 2020-01-27 | Disposition: A | Discharge status: Home / Self Care | Payer: Worker's Compensation | Attending: Emergency Medicine | Admitting: Emergency Medicine

## 2020-01-27 ENCOUNTER — Other Ambulatory Visit: Payer: Self-pay

## 2020-01-27 DIAGNOSIS — W19XXXA Unspecified fall, initial encounter: Secondary | ICD-10-CM | POA: Diagnosis not present

## 2020-01-27 DIAGNOSIS — M25511 Pain in right shoulder: Secondary | ICD-10-CM

## 2020-01-27 NOTE — ED Triage Notes (Signed)
Pt reports she stepped in a hole at work and fell down . Pt has Rt Shoulder pain.

## 2020-01-27 NOTE — Discharge Instructions (Addendum)
Your xray is normal.    Take Tylenol or ibuprofen as needed for discomfort.  Rest your shoulder.  Apply ice packs 2-3 times a day for up to 15 to 20 minutes.  Wear the sling as needed for comfort.  Follow-up with your primary care provider or an orthopedist if your symptoms are not improving.

## 2020-01-27 NOTE — ED Provider Notes (Signed)
[place]    CSN: [id number] Arrival date & time: 01/27/20  1308      History   Chief Complaint Chief Complaint  Patient presents with  . Shoulder Injury    HPI Erin [name] is a 54 y.o. female.   Patient presents with pain in her right shoulder since falling today.  She states she stepped in a hole and fell; the cart she was pushing landed on her.  No head injury or loss of consciousness.  She denies weakness, numbness, paresthesias, open wounds, dizziness, chest pain, shortness of breath, abdominal pain, or other symptoms.  Her medical history includes diabetes, hypertension, OSA, obesity, DDD, GERD, anxiety, depression.  The history is provided by the patient and medical records.    Past Medical History:  Diagnosis Date  . Anxiety   . Depression   . GERD (gastroesophageal reflux disease)   . Hyperlipidemia   . Hypertension   . Menorrhagia   . OSA (obstructive sleep apnea) 11/25/2017  . OSA on CPAP   . Type 2 diabetes mellitus ([place])   . Wears glasses     Patient Active Problem List   Diagnosis Date Noted  . OSA (obstructive sleep apnea) 11/25/2017  . Obesity hypoventilation syndrome ([place]) 11/25/2017  . Mallet finger of right hand 09/03/2017  . Anterolisthesis 04/17/2017  . 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[place]) 04/17/2017  . Degenerative disc disease, lumbar 04/17/2017  . Class 2 obesity with body mass index (BMI) of 35.0 to 35.9 in adult 06/01/2016  . Type 2 diabetes mellitus with hyperglycemia, without long-term current use of insulin ([place]) 05/27/2016  . HTN (hypertension) 04/13/2016  . Menorrhagia 08/26/2015    Class: Present on Admission    Past Surgical History:  Procedure Laterality Date  . BREAST EXCISIONAL BIOPSY    . DILITATION & CURRETTAGE/HYSTROSCOPY WITH NOVASURE ABLATION N/A 08/26/2015   Procedure: DILATATION & CURETTAGE/HYSTEROSCOPY WITH NOVASURE ABLATION;  Surgeon: [name], MD;  Location: [facility];  Service:  Gynecology;  Laterality: N/A;  . EXCISIONAL LEFT BREAST BX  11-14-2007   benign  . LAPAROSCOPIC TUBAL LIGATION Bilateral 08/26/2015   Procedure: LAPAROSCOPIC TUBAL LIGATION;  Surgeon: [name], MD;  Location: [facility];  Service: Gynecology;  Laterality: Bilateral;  . THYROID LOBECTOMY Right 04-30-2008    OB History   No obstetric history on file.      Home Medications    Prior to Admission medications   Medication Sig Start Date End Date Taking? Authorizing Provider  amLODipine (NORVASC) 5 MG tablet TAKE 1 TABLET (5 MG TOTAL) BY MOUTH DAILY. 12/12/18  Yes Daune Perch, NP  blood glucose meter kit and supplies Use up to 3 times daily as directed. E11.9 05/12/17  Yes Stallings, Zoe A, MD  Blood Glucose Monitoring Suppl (FREESTYLE FREEDOM LITE) w/Device KIT E 11.9 Use to check blood glucose once daily 05/27/17  Yes Stallings, Zoe A, MD  carvedilol (COREG) 6.25 MG tablet TAKE 1 TABLET BY MOUTH 2 TIMES DAILY WITH A MEAL. 03/17/19  Yes Dorothy Spark, MD  glucose blood (FREESTYLE LITE) test strip Test blood glucose once a day. E11.9. Freestyle Lite 05/27/17  Yes Stallings, Zoe A, MD  glycopyrrolate (ROBINUL) 1 MG tablet Take 1 tablet by mouth 2 (two) times daily. 06/16/17  Yes [provider]  hydrochlorothiazide (HYDRODIURIL) 25 MG tablet TAKE 1 TABLET (25 MG TOTAL) BY MOUTH DAILY. 12/12/18  Yes Daune Perch, NP  Lancet Devices (LANCING DEVICE) MISC Use to check blood glucose as  needed. E11.9 05/12/17  Yes Forrest Moron, MD  Lancets (FREESTYLE) lancets E11.9 Use as instructed. Check blood glucose once daily 05/27/17  Yes Stallings, Zoe A, MD  metFORMIN (GLUCOPHAGE) 500 MG tablet TAKE 1 TABLET (500 MG TOTAL) BY MOUTH DAILY WITH BREAKFAST. 03/17/19  Yes Stallings, Zoe A, MD  Multiple Vitamins-Minerals (MULTIVITAMIN GUMMIES ADULT PO) Take 2 each by mouth daily.   Yes [provider]  olmesartan (BENICAR) 40 MG tablet Take 1 tablet (40 mg total) by mouth daily.  12/12/18  Yes Daune Perch, NP  rosuvastatin (CRESTOR) 10 MG tablet TAKE 1 TABLET BY MOUTH ONCE DAILY 10/10/19  Yes Fay Records, MD  meloxicam (MOBIC) 15 MG tablet Take 1 tablet (15 mg total) by mouth daily. 08/30/18   Landis Martins, DPM    Family History Family History  Problem Relation Age of Onset  . Stroke Mother   . Diabetes Mother   . Hypertension Mother   . Glaucoma Father   . Heart disease Paternal Grandmother        enlarged heart  . Diabetes Half-Sister   . Hypertension Half-Sister   . Colon cancer Neg Hx   . Esophageal cancer Neg Hx   . Stomach cancer Neg Hx   . Colon polyps Neg Hx   . Rectal cancer Neg Hx     Social History Social History   Tobacco Use  . Smoking status: Former Smoker    Years: 15.00    Types: Cigarettes    Quit date: 2001    Years since quitting: 20.9  . Smokeless tobacco: Never Used  Vaping Use  . Vaping Use: Never used  Substance Use Topics  . Alcohol use: No  . Drug use: No     Allergies   Patient has no known allergies.   Review of Systems Review of Systems  Constitutional: Negative for chills and fever.  HENT: Negative for ear pain and sore throat.   Eyes: Negative for pain and visual disturbance.  Respiratory: Negative for cough and shortness of breath.   Cardiovascular: Negative for chest pain and palpitations.  Gastrointestinal: Negative for abdominal pain and vomiting.  Genitourinary: Negative for dysuria and hematuria.  Musculoskeletal: Positive for arthralgias. Negative for back pain.  Skin: Negative for color change and rash.  Neurological: Negative for seizures, syncope, weakness and numbness.  All other systems reviewed and are negative.    Physical Exam Triage Vital Signs ED Triage Vitals  Enc Vitals Group     BP      Pulse      Resp      Temp      Temp src      SpO2      Weight      Height      Head Circumference      Peak Flow      Pain Score      Pain Loc      Pain Edu?      Excl. in [place]?     No data found.  Updated Vital Signs BP (!) 174/118 (BP Location: Left Arm)   Pulse 75   Temp 98.3 F (36.8 C) (Oral)   Resp 18   Ht '5\' 3"'  (1.6 m)   Wt 290 lb (131.5 kg)   SpO2 96%   BMI 51.37 kg/m   Visual Acuity Right Eye Distance:   Left Eye Distance:   Bilateral Distance:    Right Eye Near:   Left Eye Near:  Bilateral Near:     Physical Exam Vitals and nursing note reviewed.  Constitutional:      General: She is not in acute distress.    Appearance: She is well-developed and well-nourished. She is obese. She is not ill-appearing.  HENT:     Head: Normocephalic and atraumatic.     Mouth/Throat:     Mouth: Mucous membranes are moist.  Eyes:     Conjunctiva/sclera: Conjunctivae normal.  Cardiovascular:     Rate and Rhythm: Normal rate and regular rhythm.     Heart sounds: Normal heart sounds.  Pulmonary:     Effort: Pulmonary effort is normal. No respiratory distress.     Breath sounds: Normal breath sounds.  Abdominal:     Palpations: Abdomen is soft.     Tenderness: There is no abdominal tenderness.  Musculoskeletal:        General: Tenderness present. No swelling, deformity, signs of injury or edema.     Cervical back: Neck supple.     Comments: Right shoulder: decreased ROM, tender to palpation.   Skin:    General: Skin is warm and dry.     Capillary Refill: Capillary refill takes less than 2 seconds.     Findings: No bruising, erythema, lesion or rash.  Neurological:     General: No focal deficit present.     Mental Status: She is alert and oriented to person, place, and time.     Sensory: No sensory deficit.     Motor: No weakness.     Coordination: Coordination normal.     Gait: Gait normal.  Psychiatric:        Mood and Affect: Mood and affect and mood normal.        Behavior: Behavior normal.      UC Treatments / Results  Labs (all labs ordered are listed, but only abnormal results are displayed) Labs Reviewed - No data to  display  EKG   Radiology DG Shoulder Right  Result Date: 01/27/2020 CLINICAL DATA:  Pain after fall EXAM: RIGHT SHOULDER - 2+ VIEW COMPARISON:  None. FINDINGS: There is no evidence of fracture or dislocation. There is no evidence of arthropathy or other focal bone abnormality. Soft tissues are unremarkable. IMPRESSION: Negative. Electronically Signed   By: Dorise Bullion III M.D   On: 01/27/2020 15:06    Procedures Procedures (including critical care time)  Medications Ordered in UC Medications - No data to display  Initial Impression / Assessment and Plan / UC Course  I have reviewed the triage vital signs and the nursing notes.  Pertinent labs & imaging results that were available during my care of the patient were reviewed by me and considered in my medical decision making (see chart for details).   Acute right shoulder pain.  X-ray negative.  Treating with Tylenol or ibuprofen, rest, ice packs, sling as needed for comfort.  Instructed patient to follow-up with her PCP or an orthopedist if her symptoms are not improving.  She agrees to plan of care.   Final Clinical Impressions(s) / UC Diagnoses   Final diagnoses:  Acute pain of right shoulder     Discharge Instructions     Your xray is normal.    Take Tylenol or ibuprofen as needed for discomfort.  Rest your shoulder.  Apply ice packs 2-3 times a day for up to 15 to 20 minutes.  Wear the sling as needed for comfort.  Follow-up with your primary care provider or an orthopedist if  your symptoms are not improving.    ED Prescriptions    None     PDMP not reviewed this encounter.   Sharion Balloon, NP 01/27/20 [phone]

## 2020-01-29 ENCOUNTER — Other Ambulatory Visit (HOSPITAL_COMMUNITY): Payer: Self-pay | Admitting: Occupational Medicine

## 2020-01-29 MED FILL — traMADol HCL 50 MG TABS: 50 | 30 days supply | Qty: 30 | Fill #0

## 2020-01-31 DIAGNOSIS — G4733 Obstructive sleep apnea (adult) (pediatric): Secondary | ICD-10-CM | POA: Diagnosis not present

## 2020-02-01 DIAGNOSIS — Z01419 Encounter for gynecological examination (general) (routine) without abnormal findings: Secondary | ICD-10-CM | POA: Diagnosis not present

## 2020-02-01 DIAGNOSIS — Z6841 Body Mass Index (BMI) 40.0 and over, adult: Secondary | ICD-10-CM | POA: Diagnosis not present

## 2020-02-21 ENCOUNTER — Other Ambulatory Visit (HOSPITAL_COMMUNITY): Payer: Self-pay | Admitting: Surgical

## 2020-02-21 MED FILL — MELOXICAM 15 MG TABLET: 15 | 30 days supply | Qty: 30 | Fill #0

## 2020-02-22 ENCOUNTER — Other Ambulatory Visit (HOSPITAL_COMMUNITY): Payer: Self-pay | Admitting: Orthopedic Surgery

## 2020-02-22 ENCOUNTER — Other Ambulatory Visit: Payer: Self-pay | Admitting: Orthopedic Surgery

## 2020-02-22 DIAGNOSIS — M25511 Pain in right shoulder: Secondary | ICD-10-CM

## 2020-03-02 ENCOUNTER — Ambulatory Visit (HOSPITAL_COMMUNITY)
Admission: RE | Admit: 2020-03-02 | Discharge: 2020-03-02 | Disposition: A | Discharge status: Home / Self Care | Payer: PRIVATE HEALTH INSURANCE | Source: Ambulatory Visit | Attending: Orthopedic Surgery | Admitting: Orthopedic Surgery

## 2020-03-02 DIAGNOSIS — M25511 Pain in right shoulder: Secondary | ICD-10-CM | POA: Insufficient documentation

## 2020-03-05 ENCOUNTER — Telehealth: Payer: Self-pay | Admitting: Internal Medicine

## 2020-03-05 DIAGNOSIS — Z1231 Encounter for screening mammogram for malignant neoplasm of breast: Secondary | ICD-10-CM | POA: Diagnosis not present

## 2020-03-05 MED FILL — MELOXICAM 15 MG TABLET: 15 | 30 days supply | Qty: 30 | Fill #0

## 2020-03-05 NOTE — Telephone Encounter (Signed)
Scheduled patient for 03/06/20 at 2:40 with Dr. Harrington Challenger

## 2020-03-06 ENCOUNTER — Encounter: Payer: Self-pay | Admitting: Internal Medicine

## 2020-03-06 ENCOUNTER — Other Ambulatory Visit: Payer: Self-pay

## 2020-03-06 ENCOUNTER — Other Ambulatory Visit: Payer: Self-pay | Admitting: Internal Medicine

## 2020-03-06 ENCOUNTER — Ambulatory Visit: Payer: 59 | Admitting: Internal Medicine

## 2020-03-06 VITALS — BP 140/70 | HR 79 | Ht 63.0 in | Wt 310.0 lb

## 2020-03-06 DIAGNOSIS — E785 Hyperlipidemia, unspecified: Secondary | ICD-10-CM

## 2020-03-06 DIAGNOSIS — I10 Essential (primary) hypertension: Secondary | ICD-10-CM

## 2020-03-06 MED ORDER — OLMESARTAN MEDOXOMIL 40 MG PO TABS: 40.0000 mg | ORAL_TABLET | Freq: Every day | ORAL | 3 refills | Status: DC

## 2020-03-06 MED ORDER — AMLODIPINE BESYLATE 5 MG PO TABS: 5.0000 mg | ORAL_TABLET | Freq: Every day | ORAL | 3 refills | Status: DC

## 2020-03-06 MED ORDER — CARVEDILOL 6.25 MG PO TABS: ORAL_TABLET | ORAL | 3 refills | Status: DC

## 2020-03-06 MED ORDER — HYDROCHLOROTHIAZIDE 25 MG PO TABS: 25.0000 mg | ORAL_TABLET | Freq: Every day | ORAL | 3 refills | Status: DC

## 2020-03-06 MED ORDER — ROSUVASTATIN CALCIUM 10 MG PO TABS: 10.0000 mg | ORAL_TABLET | Freq: Every day | ORAL | 3 refills | Status: DC

## 2020-03-06 MED FILL — HYDROCHLOROTHIAZIDE 25 MG T: 25 | 90 days supply | Qty: 90 | Fill #0

## 2020-03-06 MED FILL — ROSUVASTATIN CALCIUM 10 MG: 10 | 90 days supply | Qty: 90 | Fill #0

## 2020-03-06 MED FILL — AMLODIPINE BESYLATE 5 MG TA: 5 | 90 days supply | Qty: 90 | Fill #0

## 2020-03-06 MED FILL — CARVEDILOL 6.25 MG TABLET: 6.25 | 90 days supply | Qty: 180 | Fill #0

## 2020-03-06 MED FILL — OLMESARTAN MEDOXOMIL 40 MG: 40 | 90 days supply | Qty: 90 | Fill #0

## 2020-03-06 NOTE — Patient Instructions (Signed)
Medication Instructions:  No changes *If you need a refill on your cardiac medications before your next appointment, please call your pharmacy*   Lab Work: none If you have labs (blood work) drawn today and your tests are completely normal, you will receive your results only by: [name] MyChart Message (if you have MyChart) OR . A paper copy in the mail If you have any lab test that is abnormal or we need to change your treatment, we will call you to review the results.   Testing/Procedures: none   Follow-Up: At [facility], you and your health needs are our priority.  As part of our continuing mission to provide you with exceptional heart care, we have created designated Provider Care Teams.  These Care Teams include your primary Cardiologist (physician) and Advanced Practice Providers (APPs -  Physician Assistants and Nurse Practitioners) who all work together to provide you with the care you need, when you need it.   Your next appointment:   8 month(s)  The format for your next appointment:   In Person  Provider:   You may see Dorris Carnes, MD or one of the following Advanced Practice Providers on your designated Care Team:    Richardson Dopp, PA-C  Robbie Lis, [place]   Other Instructions

## 2020-03-06 NOTE — Progress Notes (Signed)
Cardiology Office Note   Date:  03/06/2020   ID:  Erin [name], DOB 08/30/1965, MRN [id number]  PCP:  Patient, No Pcp Per  Cardiologist:   Dorris Carnes, MD   F/U of  HTN    History of Present Illness: Erin [name] is a 55 y.o. female with a history of DM, OSA (on CPAP), GERD, former tobacco use  and HTN   Pt also with strong FHx fo CAD  Pt was last seen in cardiology in July 2020 The pt presents for f/u    In Dec she was injured at work   Was pulling a cart of supplies from parking lot to [place] went into hole and she fell over.   Hurt R shoullder    Being evaluated by Dr Tamera Punt.   MRI just done   The pt denies CP   Breathing is OK   NO dizziness   Allergies:   Patient has no known allergies.   Past Medical History:  Diagnosis Date  . Anxiety   . Depression   . GERD (gastroesophageal reflux disease)   . Hyperlipidemia   . Hypertension   . Menorrhagia   . OSA (obstructive sleep apnea) 11/25/2017  . OSA on CPAP   . Type 2 diabetes mellitus ([place])   . Wears glasses     Past Surgical History:  Procedure Laterality Date  . BREAST EXCISIONAL BIOPSY    . DILITATION & CURRETTAGE/HYSTROSCOPY WITH NOVASURE ABLATION N/A 08/26/2015   Procedure: DILATATION & CURETTAGE/HYSTEROSCOPY WITH NOVASURE ABLATION;  Surgeon: [name], MD;  Location: [facility];  Service: Gynecology;  Laterality: N/A;  . EXCISIONAL LEFT BREAST BX  11-14-2007   benign  . LAPAROSCOPIC TUBAL LIGATION Bilateral 08/26/2015   Procedure: LAPAROSCOPIC TUBAL LIGATION;  Surgeon: [name], MD;  Location: [facility];  Service: Gynecology;  Laterality: Bilateral;  . THYROID LOBECTOMY Right 04-30-2008     Social History:  The patient  reports that she quit smoking about 21 years ago. Her smoking use included cigarettes. She quit after 15.00 years of use. She has never used smokeless tobacco. She reports that she does not drink alcohol and does not use drugs.    Family History:  The patient's family history includes Diabetes in her half-sister and mother; Glaucoma in her father; Heart disease in her paternal grandmother; Hypertension in her half-sister and mother; Stroke in her mother.    ROS:  Please see the history of present illness. All other systems are reviewed and  Negative to the above problem except as noted.    PHYSICAL EXAM: VS:  BP 140/70   Pulse 79   Ht 5\' 3"  (1.6 m)   Wt (!) 310 lb (140.6 kg)   SpO2 96%   BMI 54.91 kg/m   GEN: Morbidly obese 55 yo in no acute distress  HEENT: normal  Neck: no JVD, carotid bruits Cardiac: RRR; no murmurs.  No LE edema  Respiratory:  clear to auscultation bilaterally GI: soft, nontender, nondistended, + BS  No hepatomegaly  MS: no deformity L arm movement restricted  Skin: warm and dry, no rash Neuro:  Strength and sensation are intact Psych: euthymic mood, full affect   EKG:  EKG is done today NSR 79 bpm     Lipid Panel    Component Value Date/Time   CHOL 146 06/21/2019 1643   TRIG 76 06/21/2019 1643   HDL 47 06/21/2019 1643   CHOLHDL 3.1 06/21/2019 1643  CHOLHDL 4.1 CALC 05/20/2006 1135   VLDL 13 05/20/2006 1135   LDLCALC 84 06/21/2019 1643      Wt Readings from Last 3 Encounters:  03/06/20 (!) 310 lb (140.6 kg)  01/27/20 290 lb (131.5 kg)  08/10/19 297 lb 14.4 oz (135.1 kg)      ASSESSMENT AND PLAN:  1  HTN  BP is fair Better than previous   She is under signif discomfort due to sh[place]oulder  I would keep on same regimen for now     2  HL  Lpids LDL 8n May 2021 84  HDL 47   Follow   3  Morbid obesity     Reviewed carbs/low sugar   And time restricted eating   4   Ortho   Pt to f/u in ortho with Dr Tamera Punt   If any surgery is planned on shoulder I think that pt would be at low risk for cardiac complications in periop period and OK to proceed    Plan for f/u in fall       Current medicines are reviewed at length with the patient today.  The patient does not have  concerns regarding medicines.  Signed, Dorris Carnes, MD  03/06/2020 8:19 PM    [address], [place], [place]  [address] Phone: [phone]; Fax: [phone]

## 2020-03-08 ENCOUNTER — Encounter (INDEPENDENT_AMBULATORY_CARE_PROVIDER_SITE_OTHER): Payer: Self-pay | Admitting: Family Medicine

## 2020-03-08 ENCOUNTER — Other Ambulatory Visit: Payer: Self-pay

## 2020-03-08 ENCOUNTER — Ambulatory Visit (INDEPENDENT_AMBULATORY_CARE_PROVIDER_SITE_OTHER): Payer: 59 | Admitting: Family Medicine

## 2020-03-08 VITALS — BP 159/91 | HR 79 | Temp 97.9°F | Ht 64.0 in | Wt 306.0 lb

## 2020-03-08 DIAGNOSIS — Z9189 Other specified personal risk factors, not elsewhere classified: Secondary | ICD-10-CM | POA: Diagnosis not present

## 2020-03-08 DIAGNOSIS — E785 Hyperlipidemia, unspecified: Secondary | ICD-10-CM

## 2020-03-08 DIAGNOSIS — E1165 Type 2 diabetes mellitus with hyperglycemia: Secondary | ICD-10-CM

## 2020-03-08 DIAGNOSIS — R0602 Shortness of breath: Secondary | ICD-10-CM | POA: Diagnosis not present

## 2020-03-08 DIAGNOSIS — E559 Vitamin D deficiency, unspecified: Secondary | ICD-10-CM

## 2020-03-08 DIAGNOSIS — I10 Essential (primary) hypertension: Secondary | ICD-10-CM

## 2020-03-08 DIAGNOSIS — R5383 Other fatigue: Secondary | ICD-10-CM

## 2020-03-08 DIAGNOSIS — Z6841 Body Mass Index (BMI) 40.0 and over, adult: Secondary | ICD-10-CM

## 2020-03-08 DIAGNOSIS — Z1331 Encounter for screening for depression: Secondary | ICD-10-CM

## 2020-03-08 DIAGNOSIS — Z0289 Encounter for other administrative examinations: Secondary | ICD-10-CM

## 2020-03-12 LAB — COMPREHENSIVE METABOLIC PANEL
ALT: 23 IU/L (ref 0–32)
AST: 17 IU/L (ref 0–40)
Albumin/Globulin Ratio: 1.3 (ref 1.2–2.2)
Albumin: 3.8 g/dL (ref 3.8–4.9)
Alkaline Phosphatase: 90 IU/L (ref 44–121)
BUN/Creatinine Ratio: 22 (ref 9–23)
BUN: 21 mg/dL (ref 6–24)
Bilirubin Total: 0.3 mg/dL (ref 0.0–1.2)
CO2: 25 mmol/L (ref 20–29)
Calcium: 10.1 mg/dL (ref 8.7–10.2)
Chloride: 105 mmol/L (ref 96–106)
Creatinine, Ser: 0.96 mg/dL (ref 0.57–1.00)
GFR calc Af Amer: 78 mL/min/{1.73_m2} (ref >59)
GFR calc non Af Amer: 67 mL/min/{1.73_m2} (ref >59)
Globulin, Total: 3 g/dL (ref 1.5–4.5)
Glucose: 143 mg/dL — ABNORMAL HIGH (ref 65–99)
Potassium: 4.3 mmol/L (ref 3.5–5.2)
Sodium: 141 mmol/L (ref 134–144)
Total Protein: 6.8 g/dL (ref 6.0–8.5)

## 2020-03-12 LAB — T3: T3, Total: 129 ng/dL (ref 71–180)

## 2020-03-12 LAB — TSH: TSH: 2.32 u[IU]/mL (ref 0.450–4.500)

## 2020-03-12 LAB — T4: T4, Total: 8 ug/dL (ref 4.5–12.0)

## 2020-03-12 LAB — VITAMIN D 25 HYDROXY (VIT D DEFICIENCY, FRACTURES): Vit D, 25-Hydroxy: 17.3 ng/mL — ABNORMAL LOW (ref 30.0–100.0)

## 2020-03-12 LAB — LIPID PANEL WITH LDL/HDL RATIO
Cholesterol, Total: 228 mg/dL — ABNORMAL HIGH (ref 100–199)
HDL: 52 mg/dL (ref >39)
LDL Chol Calc (NIH): 164 mg/dL — ABNORMAL HIGH (ref 0–99)
LDL/HDL Ratio: 3.2 ratio (ref 0.0–3.2)
Triglycerides: 70 mg/dL (ref 0–149)
VLDL Cholesterol Cal: 12 mg/dL (ref 5–40)

## 2020-03-12 LAB — INSULIN, RANDOM: INSULIN: 27.9 u[IU]/mL — ABNORMAL HIGH (ref 2.6–24.9)

## 2020-03-12 LAB — FOLATE: Folate: 4.8 ng/mL (ref >3.0)

## 2020-03-12 LAB — VITAMIN B12: Vitamin B-12: 860 pg/mL (ref 232–1245)

## 2020-03-12 NOTE — Progress Notes (Signed)
Chief Complaint:   OBESITY Erin [name] (MR# [id number]) is a 55 y.o. female who presents for evaluation and treatment of obesity and related comorbidities. Current BMI is Body mass index is 52.52 kg/m. Erin [name] has been struggling with her weight for many years and has been unsuccessful in either losing weight, maintaining weight loss, or reaching her healthy weight goal.  Erin [name]'s husband had gastric bypass approximately 12 years ago, and he died from complications so she is naturally not interested in weight loss surgery which is fine.  Erin [name] is currently in the action stage of change and ready to dedicate time achieving and maintaining a healthier weight. Erin [name] is interested in becoming our patient and working on intensive lifestyle modifications including (but not limited to) diet and exercise for weight loss.  Erin [name]'s habits were reviewed today and are as follows: her desired weight loss is 106 lbs, she has been heavy most of her life, she started gaining weight last year around the Summer, her heaviest weight ever was 323 pounds, she is a picky eater and doesn't like to eat healthier foods, she has significant food cravings issues, she snacks frequently in the evenings, she skips meals frequently, she frequently makes poor food choices, she has problems with excessive hunger, she frequently eats larger portions than normal and she struggles with emotional eating.  Depression Screen Erin [name]'s Food and Mood (modified PHQ-9) score was 0.  Depression screen Erin [name] 2/9 03/08/2020  Decreased Interest 0  Down, Depressed, Hopeless 0  PHQ - 2 Score 0  Altered sleeping 0  Tired, decreased energy 0  Change in appetite 0  Feeling bad or failure about yourself  0  Trouble concentrating 0  Moving slowly or fidgety/restless 0  Suicidal thoughts 0  PHQ-9 Score 0  Difficult doing work/chores Not difficult at all   Subjective:   1. Other fatigue Erin [name] admits to  daytime somnolence and denies waking up still tired. Patent has a history of symptoms of daytime fatigue. Erin [name] generally gets 5 or 6 hours of sleep per night, and states that she has generally restful sleep. Snoring is not present. Apneic episodes are present. Epworth Sleepiness Score is 5.  2. Shortness of breath on exertion Erin [name] notes increasing shortness of breath with exercising and seems to be worsening over time with weight gain. She notes getting out of breath sooner with activity than she used to. This has not gotten worse recently. Erin [name] denies shortness of breath at rest or orthopnea.  3. Type 2 diabetes mellitus with hyperglycemia, without long-term current use of insulin (Erin [name]) Erin [name] is on metformin, and her last 1c was controlled at 6.5. She notes polyphagia most days.  4. Essential hypertension Erin [name]'s blood pressure is elevated today. She is on multiple medications. She is ready to work on diet and weight loss.  5. Hyperlipidemia, unspecified hyperlipidemia type Erin [name] is on statin, and she is ready to work on diet and weight loss.  6. Vitamin D deficiency Erin [name] is not on Vit D, and she has a history of Vit D deficiency and fatigue.  7. At risk for heart disease Erin [name] is at a higher than average risk for cardiovascular disease due to obesity.   Assessment/Plan:   1. Other fatigue Erin [name] does feel that her weight is causing her energy to be lower than it should be. Fatigue may be related to obesity, depression or many other causes. Labs will be ordered, and in the meanwhile, Erin [name] will focus on self care  including making healthy food choices, increasing physical activity and focusing on stress reduction.  - Vitamin B12 - Folate - T3 - T4 - TSH  2. Shortness of breath on exertion Erin [name] does feel that she gets out of breath more easily that she used to when she exercises. Erin [name]'s shortness of breath appears to be obesity  related and exercise induced. She has agreed to work on weight loss and gradually increase exercise to treat her exercise induced shortness of breath. Will continue to monitor closely.  - CBC with Differential/Platelet  3. Type 2 diabetes mellitus with hyperglycemia, without long-term current use of insulin (HCC) Good blood sugar control is important to decrease the likelihood of diabetic complications such as nephropathy, neuropathy, limb loss, blindness, coronary artery disease, and death. Intensive lifestyle modification including diet, exercise and weight loss are the first line of treatment for diabetes. We will check labs today. Erin [name] will start her Category 3 plan and will continue to follow up as directed.  - Insulin, random - Hemoglobin A1c  4. Essential hypertension Erin [name] will start her eating plan, and will continue working on healthy weight loss and exercise to improve blood pressure control. We will watch for signs of hypotension as she continues her lifestyle modifications. We will check labs today.  - Comprehensive metabolic panel  5. Hyperlipidemia, unspecified hyperlipidemia type Cardiovascular risk and specific lipid/LDL goals reviewed. We discussed several lifestyle modifications today. We will check labs today. Erin [name] will continue her medications, and will start her Category 3 plan. She will continue to work on exercise and weight loss efforts. Orders and follow up as documented in patient record.   - Lipid Panel With LDL/HDL Ratio  6. Vitamin D deficiency Low Vitamin D level contributes to fatigue and are associated with obesity, breast, and colon cancer. We will check labs today. Erin [name] will follow-up for routine testing of Vitamin D, at least 2-3 times per year to avoid over-replacement.  - VITAMIN D 25 Hydroxy (Vit-D Deficiency, Fractures)  7. Screening for depression Erin [name] had a negative depression screening. Depression is commonly associated with  obesity and often results in emotional eating behaviors. We will monitor this closely and work on CBT to help improve the non-hunger eating patterns. Referral to Psychology may be required if no improvement is seen as she continues in our clinic.  8. At risk for heart disease Erin [name] was given approximately 30 minutes of coronary artery disease prevention counseling today. She is 55 y.o. female and has risk factors for heart disease including obesity. We discussed intensive lifestyle modifications today with an emphasis on specific weight loss instructions and strategies.   Repetitive spaced learning was employed today to elicit superior memory formation and behavioral change.  9. Class 3 severe obesity with serious comorbidity and body mass index (BMI) of 50.0 to 59.9 in adult, unspecified obesity type (HCC) Erin [name] is currently in the action stage of change and her goal is to continue with weight loss efforts. I recommend Erin [name] begin the structured treatment plan as follows:  She has agreed to the Category 3 Plan.  Exercise goals: No exercise has been prescribed for now, while we concentrate on nutritional changes.  Behavioral modification strategies: increasing lean protein intake and no skipping meals.  She was informed of the importance of frequent follow-up visits to maximize her success with intensive lifestyle modifications for her multiple health conditions. She was informed we would discuss her lab results at her next visit unless there is a critical issue that  needs to be addressed sooner. Erin [name] agreed to keep her next visit at the agreed upon time to discuss these results.  Objective:   Blood pressure (!) 159/91, pulse 79, temperature 97.9 F (36.6 C), height 5\' 4"  (1.626 m), weight (!) 306 lb (138.8 kg), SpO2 96 %. Body mass index is 52.52 kg/m.  EKG: Normal sinus rhythm, rate 79 BPM.  Indirect Calorimeter completed today shows a VO2 of 302 and a REE of 2105.  Her  calculated basal metabolic rate is 4665 thus her basal metabolic rate is better than expected.  General: Cooperative, alert, well developed, in no acute distress. HEENT: Conjunctivae and lids unremarkable. Cardiovascular: Regular rhythm.  Lungs: Normal work of breathing. Neurologic: No focal deficits.   Lab Results  Component Value Date   CREATININE 0.96 03/08/2020   BUN 21 03/08/2020   NA 141 03/08/2020   K 4.3 03/08/2020   CL 105 03/08/2020   CO2 25 03/08/2020   Lab Results  Component Value Date   ALT 23 03/08/2020   AST 17 03/08/2020   ALKPHOS 90 03/08/2020   BILITOT 0.3 03/08/2020   Lab Results  Component Value Date   HGBA1C 6.5 (A) 06/21/2019   HGBA1C 5.6 08/09/2018   HGBA1C 5.5 05/12/2017   HGBA1C 5.7 05/27/2016   Lab Results  Component Value Date   INSULIN 27.9 (H) 03/08/2020   Lab Results  Component Value Date   TSH 2.320 03/08/2020   Lab Results  Component Value Date   CHOL 228 (H) 03/08/2020   HDL 52 03/08/2020   LDLCALC 164 (H) 03/08/2020   TRIG 70 03/08/2020   CHOLHDL 3.1 06/21/2019   Lab Results  Component Value Date   WBC 6.4 04/09/2017   HGB 12.2 04/09/2017   HCT 36.1 04/09/2017   MCV 93 04/09/2017   PLT 240 04/09/2017   No results found for: IRON, TIBC, FERRITIN  Attestation Statements:   Reviewed by clinician on day of visit: allergies, medications, problem list, medical history, surgical history, family history, social history, and previous encounter notes.   I, Trixie Dredge, am acting as transcriptionist for Dennard Nip, MD.  I have reviewed the above documentation for accuracy and completeness, and I agree with the above. - Dennard Nip, MD

## 2020-03-13 ENCOUNTER — Other Ambulatory Visit (INDEPENDENT_AMBULATORY_CARE_PROVIDER_SITE_OTHER): Payer: Self-pay

## 2020-03-13 DIAGNOSIS — R0602 Shortness of breath: Secondary | ICD-10-CM

## 2020-03-13 DIAGNOSIS — E7849 Other hyperlipidemia: Secondary | ICD-10-CM

## 2020-03-13 DIAGNOSIS — E1165 Type 2 diabetes mellitus with hyperglycemia: Secondary | ICD-10-CM

## 2020-03-13 DIAGNOSIS — R5383 Other fatigue: Secondary | ICD-10-CM | POA: Insufficient documentation

## 2020-03-13 DIAGNOSIS — I10 Essential (primary) hypertension: Secondary | ICD-10-CM

## 2020-03-13 DIAGNOSIS — E559 Vitamin D deficiency, unspecified: Secondary | ICD-10-CM

## 2020-03-14 LAB — CBC WITH DIFFERENTIAL/PLATELET
Basophils Absolute: 0 10*3/uL (ref 0.0–0.2)
Basos: 1 %
EOS (ABSOLUTE): 0.2 10*3/uL (ref 0.0–0.4)
Eos: 2 %
Hematocrit: 39.4 % (ref 34.0–46.6)
Hemoglobin: 13.2 g/dL (ref 11.1–15.9)
Immature Grans (Abs): 0.1 10*3/uL (ref 0.0–0.1)
Immature Granulocytes: 1 %
Lymphocytes Absolute: 1.6 10*3/uL (ref 0.7–3.1)
Lymphs: 23 %
MCH: 30.2 pg (ref 26.6–33.0)
MCHC: 33.5 g/dL (ref 31.5–35.7)
MCV: 90 fL (ref 79–97)
Monocytes Absolute: 0.6 10*3/uL (ref 0.1–0.9)
Monocytes: 9 %
Neutrophils Absolute: 4.3 10*3/uL (ref 1.4–7.0)
Neutrophils: 64 %
Platelets: 271 10*3/uL (ref 150–450)
RBC: 4.37 x10E6/uL (ref 3.77–5.28)
RDW: 13 % (ref 11.7–15.4)
WBC: 6.8 10*3/uL (ref 3.4–10.8)

## 2020-03-22 ENCOUNTER — Encounter (INDEPENDENT_AMBULATORY_CARE_PROVIDER_SITE_OTHER): Payer: Self-pay | Admitting: Family Medicine

## 2020-03-22 ENCOUNTER — Other Ambulatory Visit (INDEPENDENT_AMBULATORY_CARE_PROVIDER_SITE_OTHER): Payer: Self-pay | Admitting: Family Medicine

## 2020-03-22 ENCOUNTER — Ambulatory Visit (INDEPENDENT_AMBULATORY_CARE_PROVIDER_SITE_OTHER): Payer: 59 | Admitting: Family Medicine

## 2020-03-22 ENCOUNTER — Other Ambulatory Visit: Payer: Self-pay

## 2020-03-22 VITALS — BP 157/88 | HR 66 | Temp 98.0°F | Ht 64.0 in | Wt 300.0 lb

## 2020-03-22 DIAGNOSIS — E559 Vitamin D deficiency, unspecified: Secondary | ICD-10-CM

## 2020-03-22 DIAGNOSIS — Z6841 Body Mass Index (BMI) 40.0 and over, adult: Secondary | ICD-10-CM | POA: Diagnosis not present

## 2020-03-22 DIAGNOSIS — E785 Hyperlipidemia, unspecified: Secondary | ICD-10-CM | POA: Diagnosis not present

## 2020-03-22 DIAGNOSIS — Z9189 Other specified personal risk factors, not elsewhere classified: Secondary | ICD-10-CM

## 2020-03-22 DIAGNOSIS — E1169 Type 2 diabetes mellitus with other specified complication: Secondary | ICD-10-CM

## 2020-03-22 MED ORDER — VITAMIN D (ERGOCALCIFEROL) 1.25 MG (50000 UNIT) PO CAPS: 50000.0000 [IU] | ORAL_CAPSULE | ORAL | 0 refills | Status: DC

## 2020-03-22 MED ORDER — OZEMPIC (0.25 OR 0.5 MG/DOSE) 2 MG/1.5ML ~~LOC~~ SOPN: 0.2500 mg | PEN_INJECTOR | SUBCUTANEOUS | 0 refills | Status: DC

## 2020-03-22 MED FILL — OZEMPIC 0.25 OR 0.5 MG/DOSE: 2 | 56 days supply | Qty: 2 | Fill #0

## 2020-03-22 MED FILL — VIT D2 1.25 MG (50,000 UNIT: 1.25 MG | 28 days supply | Qty: 4 | Fill #0

## 2020-03-25 NOTE — Progress Notes (Signed)
Chief Complaint:   OBESITY Erin [name] is here to discuss her progress with her obesity treatment plan along with follow-up of her obesity related diagnoses. Tanaka is on the Category 3 Plan and states she is following her eating plan approximately 50% of the time. Keshawn states she is doing 0 minutes 0 times per week.  Today's visit was #: 2 Starting weight: 306 lbs Starting date: 03/08/2020 Today's weight: 300 lbs Today's date: 03/22/2020 Total lbs lost to date: 6 Total lbs lost since last in-office visit: 6  Interim History: Erin [name] tried to follow her plan when possible, but finding groceries was challenging. She did the best she could and did well with weight loss. She missed some of her breakfast meats, and noted cravings was a bit of an issue.  Subjective:   1. Hyperlipidemia, pure [name]'s LDL was elevated despite being on Crestor. She is working on diet and weight loss. I discussed labs with the patient today.  2. Vitamin D deficiency Erin [name] is on Vit D, and her level has been low previously. She notes fatigue. I discussed labs with the patient today.   3. Type 2 diabetes mellitus with other specified complication, without long-term current use of insulin (HCC) Erin [name]'s last A1c was 6.5, which had increased. She notes polyphagia and increased cravings. She is on a low dose metformin due to GI upset. I discussed labs with the patient today.  4. At risk for deficient knowledge of diabetes mellitus  Erin [name] is at risk for deficient knowledge of diabetes mellitus due to elevated A1c.  Assessment/Plan:   1. Hyperlipidemia, pure Cardiovascular risk and specific lipid/LDL goals reviewed. We discussed several lifestyle modifications today. Ople will continue Crestor and weight loss efforts. We will recheck labs in 3 months. Orders and follow up as documented in patient record.   2. Vitamin D deficiency Low Vitamin D level contributes to fatigue and are  associated with obesity, breast, and colon cancer. Anai agreed to start prescription Vitamin D 50,000 IU every week with no refills. She will follow-up for routine testing of Vitamin D, at least 2-3 times per year to avoid over-replacement. We will recheck labs in 3 months.  - Vitamin D, Ergocalciferol, (DRISDOL) 1.25 MG (50000 UNIT) CAPS capsule; Take 1 capsule (50,000 Units total) by mouth every 7 (seven) days.  Dispense: 4 capsule; Refill: 0  3. Type 2 diabetes mellitus with other specified complication, without long-term current use of insulin (HCC) Good blood sugar control is important to decrease the likelihood of diabetic complications such as nephropathy, neuropathy, limb loss, blindness, coronary artery disease, and death. Intensive lifestyle modification including diet, exercise and weight loss are the first line of treatment for diabetes. [name] agreed to start Ozempic 0.25 mg q weekly with no refills, and she will continue metformin as is. We will recheck labs in 3 months.  - Semaglutide,0.25 or 0.5MG /DOS, (OZEMPIC, 0.25 OR 0.5 MG/DOSE,) 2 MG/1.5ML SOPN; Inject 0.25 mg into the skin once a week.  Dispense: 1.5 mL; Refill: 0  4. At risk for deficient knowledge of diabetes mellitus Erin [name] was given approximately 30 minutes of diabetes education and counseling today. We discussed intensive lifestyle modifications today with an emphasis on weight loss as well as increasing exercise and decreasing simple carbohydrates in her diet. We also reviewed medication options with an emphasis on risk versus benefit of those discussed.   5. Class 3 severe obesity with serious comorbidity and body mass index (BMI) of 50.0 to 59.9 in adult,  unspecified obesity type Erin [name]) Erin [name] is currently in the action stage of change. As such, her goal is to continue with weight loss efforts. She has agreed to the Category 3 Plan with breakfast options.   Behavioral modification strategies: increasing lean  protein intake and meal planning and cooking strategies.  Erin [name] has agreed to follow-up with our clinic in 2 weeks. She was informed of the importance of frequent follow-up visits to maximize her success with intensive lifestyle modifications for her multiple health conditions.   Objective:   Blood pressure (!) 157/88, pulse 66, temperature 98 F (36.7 C), height 5\' 4"  (1.626 m), weight 300 lb (136.1 kg), SpO2 98 %. Body mass index is 51.49 kg/m.  General: Cooperative, alert, well developed, in no acute distress. HEENT: Conjunctivae and lids unremarkable. Cardiovascular: Regular rhythm.  Lungs: Normal work of breathing. Neurologic: No focal deficits.   Lab Results  Component Value Date   CREATININE 0.96 03/08/2020   BUN 21 03/08/2020   NA 141 03/08/2020   K 4.3 03/08/2020   CL 105 03/08/2020   CO2 25 03/08/2020   Lab Results  Component Value Date   ALT 23 03/08/2020   AST 17 03/08/2020   ALKPHOS 90 03/08/2020   BILITOT 0.3 03/08/2020   Lab Results  Component Value Date   HGBA1C 6.5 (A) 06/21/2019   HGBA1C 5.6 08/09/2018   HGBA1C 5.5 05/12/2017   HGBA1C 5.7 05/27/2016   Lab Results  Component Value Date   INSULIN 27.9 (H) 03/08/2020   Lab Results  Component Value Date   TSH 2.320 03/08/2020   Lab Results  Component Value Date   CHOL 228 (H) 03/08/2020   HDL 52 03/08/2020   LDLCALC 164 (H) 03/08/2020   TRIG 70 03/08/2020   CHOLHDL 3.1 06/21/2019   Lab Results  Component Value Date   WBC 6.8 03/13/2020   HGB 13.2 03/13/2020   HCT 39.4 03/13/2020   MCV 90 03/13/2020   PLT 271 03/13/2020   No results found for: IRON, TIBC, FERRITIN  Attestation Statements:   Reviewed by clinician on day of visit: allergies, medications, problem list, medical history, surgical history, family history, social history, and previous encounter notes.   I, Trixie Dredge, am acting as transcriptionist for Dennard Nip, MD.  I have reviewed the above documentation for  accuracy and completeness, and I agree with the above. -  Dennard Nip, MD

## 2020-03-28 ENCOUNTER — Other Ambulatory Visit: Payer: Self-pay | Admitting: Orthopedic Surgery

## 2020-03-29 ENCOUNTER — Telehealth: Payer: Self-pay | Admitting: *Deleted

## 2020-03-29 NOTE — Telephone Encounter (Signed)
   [facility] Pre-operative Risk Assessment    HEARTCARE STAFF: - Please ensure there is not already an duplicate clearance open for this procedure. - Under Visit Info/Reason for Call, type in Other and utilize the format Clearance MM/DD/YY or Clearance TBD. Do not use dashes or single digits. - If request is for dental extraction, please clarify the # of teeth to be extracted.  Request for surgical clearance:  1. What type of surgery is being performed? RIGHT SHOULDER ARTHROSCOPY, ROTATOR CUFF REPAIR, SUBACROMIAL DECOMPRESSION   2. When is this surgery scheduled? 04/08/20   3. What type of clearance is required (medical clearance vs. Pharmacy clearance to hold med vs. Both)? MEDICAL  4. Are there any medications that need to be held prior to surgery and how long? NONE LISTED    5. Practice name and name of physician performing surgery? [facility]; DR. Larkin Ina CHANDLER   6. What is the office phone number? [phone]   7.   What is the office fax number? [phone]  8.   Anesthesia type (None, local, MAC, general) ? CHOICE   Julaine Hua 03/29/2020, 11:04 AM  _________________________________________________________________   (provider comments below)

## 2020-03-29 NOTE — Telephone Encounter (Signed)
   Primary Cardiologist: Dorris Carnes, MD  Chart reviewed as part of pre-operative protocol coverage. Given past medical history and time since last visit, based on ACC/AHA guidelines, Erin [name] would be at acceptable risk for the planned procedure without further cardiovascular testing.   The patient was advised that if she develops new symptoms prior to surgery to contact our office to arrange for a follow-up visit, and she verbalized understanding.  I will route this recommendation to the requesting party via Epic fax function and remove from pre-op pool.  Please call with questions.  Loel Dubonnet, NP 03/29/2020, 3:55 PM

## 2020-04-01 NOTE — Progress Notes (Signed)
Spoke with patient who will come by tomorrow (04-02-20) at noon for height/weight check, preadm interview, anesthesia consult (if needed) and lab work.

## 2020-04-02 ENCOUNTER — Other Ambulatory Visit (INDEPENDENT_AMBULATORY_CARE_PROVIDER_SITE_OTHER): Payer: Self-pay | Admitting: Adult Health

## 2020-04-02 ENCOUNTER — Encounter (INDEPENDENT_AMBULATORY_CARE_PROVIDER_SITE_OTHER): Payer: Self-pay | Admitting: Adult Health

## 2020-04-02 ENCOUNTER — Ambulatory Visit (INDEPENDENT_AMBULATORY_CARE_PROVIDER_SITE_OTHER): Payer: 59 | Admitting: Adult Health

## 2020-04-02 ENCOUNTER — Other Ambulatory Visit: Payer: Self-pay

## 2020-04-02 VITALS — BP 144/67 | HR 74 | Temp 97.6°F | Ht 64.0 in | Wt 299.0 lb

## 2020-04-02 DIAGNOSIS — E1169 Type 2 diabetes mellitus with other specified complication: Secondary | ICD-10-CM

## 2020-04-02 DIAGNOSIS — E559 Vitamin D deficiency, unspecified: Secondary | ICD-10-CM | POA: Diagnosis not present

## 2020-04-02 DIAGNOSIS — E1159 Type 2 diabetes mellitus with other circulatory complications: Secondary | ICD-10-CM | POA: Diagnosis not present

## 2020-04-02 DIAGNOSIS — I152 Hypertension secondary to endocrine disorders: Secondary | ICD-10-CM | POA: Diagnosis not present

## 2020-04-02 DIAGNOSIS — M75101 Unspecified rotator cuff tear or rupture of right shoulder, not specified as traumatic: Secondary | ICD-10-CM | POA: Diagnosis not present

## 2020-04-02 DIAGNOSIS — Z9189 Other specified personal risk factors, not elsewhere classified: Secondary | ICD-10-CM | POA: Diagnosis not present

## 2020-04-02 DIAGNOSIS — Z6841 Body Mass Index (BMI) 40.0 and over, adult: Secondary | ICD-10-CM | POA: Diagnosis not present

## 2020-04-02 MED ORDER — VITAMIN D (ERGOCALCIFEROL) 1.25 MG (50000 UNIT) PO CAPS: 50000.0000 [IU] | ORAL_CAPSULE | ORAL | 0 refills | Status: DC

## 2020-04-02 MED ORDER — OZEMPIC (0.25 OR 0.5 MG/DOSE) 2 MG/1.5ML ~~LOC~~ SOPN: 0.2500 mg | PEN_INJECTOR | SUBCUTANEOUS | 0 refills | Status: DC

## 2020-04-02 NOTE — Patient Instructions (Addendum)
DUE TO COVID-19 ONLY ONE VISITOR IS ALLOWED TO COME WITH YOU AND STAY IN THE WAITING ROOM ONLY DURING PRE OP AND PROCEDURE.           Your procedure is scheduled on:  Monday, 04-08-20   Report to [facility] Main  Entrance    Report to admitting at 11:30 AM   Call this number if you have problems the morning of surgery [phone]   Do not eat food :After Midnight.   May have liquids until 10:30 AM  day of surgery  CLEAR LIQUID DIET  Foods Allowed                                                                     Foods Excluded  Water, Black Coffee and tea, regular and decaf              liquids that you cannot  Plain Jell-O in any flavor  (No red)                                     see through such as: Fruit ices (not with fruit pulp)                                      milk, soups, orange juice              Iced Popsicles (No red)                                      All solid food                                   Apple juices Sports drinks like Gatorade (No red) Lightly seasoned clear broth or consume(fat free) Sugar, honey syrup    Oral Hygiene is also important to reduce your risk of infection.                                    Remember - BRUSH YOUR TEETH THE MORNING OF SURGERY WITH YOUR REGULAR TOOTHPASTE   Do NOT smoke after Midnight   Take these medicines the morning of surgery with A SIP OF WATER:  Amlodipine, Carvedilol, Rosuvastatin, Robinul   How to Manage Your Diabetes Before and After Surgery  Why is it important to control my blood sugar before and after surgery? . Improving blood sugar levels before and after surgery helps healing and can limit problems. . A way of improving blood sugar control is eating a healthy diet by: o  Eating less sugar and carbohydrates o  Increasing activity/exercise o  Talking with your doctor about reaching your blood sugar goals . High blood sugars (greater than 180 mg/dL) can raise your risk of infections and  slow your recovery, so you will need to focus on controlling your diabetes during the weeks  before surgery. . Make sure that the doctor who takes care of your diabetes knows about your planned surgery including the date and location.  How do I manage my blood sugar before surgery? . Check your blood sugar at least 4 times a day, starting 2 days before surgery, to make sure that the level is not too high or low. o Check your blood sugar the morning of your surgery when you wake up and every 2 hours until you get to the Short Stay unit. . If your blood sugar is less than 70 mg/dL, you will need to treat for low blood sugar: o Do not take insulin. o Treat a low blood sugar (less than 70 mg/dL) with  cup of clear juice (cranberry or apple), 4 glucose tablets, OR glucose gel. o Recheck blood sugar in 15 minutes after treatment (to make sure it is greater than 70 mg/dL). If your blood sugar is not greater than 70 mg/dL on recheck, call [phone] for further instructions. . Report your blood sugar to the short stay nurse when you get to Short Stay.  . If you are admitted to the hospital after surgery: o Your blood sugar will be checked by the staff and you will probably be given insulin after surgery (instead of oral diabetes medicines) to make sure you have good blood sugar levels. o The goal for blood sugar control after surgery is 80-180 mg/dL.   WHAT DO I DO ABOUT MY DIABETES MEDICATION?  [name] Do not take oral diabetes medicines (pills) the morning of surgery.  . THE DAY BEFORE SURGERY:  Take Metformin as prescribed.       . THE MORNING OF SURGERY:  Do not take Metformin or Ozempic.   Reviewed and Endorsed by [facility] Patient Education Committee, August 2015                               You may not have any metal on your body including hair pins, jewelry, and body piercings             Do not wear make-up, lotions, powders, perfumes/cologne, or deodorant             Do not wear nail  polish.  Do not shave  48 hours prior to surgery.    Do not bring valuables to the hospital. [place].   Contacts, dentures or bridgework may not be worn into surgery.   Patients discharged the day of surgery will not be allowed to drive home.                Please read over the following fact sheets you were given: IF YOU HAVE QUESTIONS ABOUT YOUR PRE OP INSTRUCTIONS PLEASE CALL  [place] - Preparing for Surgery Before surgery, you can play an important role.  Because skin is not sterile, your skin needs to be as free of germs as possible.  You can reduce the number of germs on your skin by washing with CHG (chlorahexidine gluconate) soap before surgery.  CHG is an antiseptic cleaner which kills germs and bonds with the skin to continue killing germs even after washing. Please DO NOT use if you have an allergy to CHG or antibacterial soaps.  If your skin becomes reddened/irritated stop using the CHG and  inform your nurse when you arrive at Short Stay. Do not shave (including legs and underarms) for at least 48 hours prior to the first CHG shower.  You may shave your face/neck.  Please follow these instructions carefully:  1.  Shower with CHG Soap the night before surgery and the  morning of surgery.  2.  If you choose to wash your hair, wash your hair first as usual with your normal  shampoo.  3.  After you shampoo, rinse your hair and body thoroughly to remove the shampoo.                             4.  Use CHG as you would any other liquid soap.  You can apply chg directly to the skin and wash.  Gently with a scrungie or clean washcloth.  5.  Apply the CHG Soap to your body ONLY FROM THE NECK DOWN.   Do   not use on face/ open                           Wound or open sores. Avoid contact with eyes, ears mouth and   genitals (private parts).                       Wash face,  Genitals (private parts) with your normal  soap.             6.  Wash thoroughly, paying special attention to the area where your    surgery  will be performed.  7.  Thoroughly rinse your body with warm water from the neck down.  8.  DO NOT shower/wash with your normal soap after using and rinsing off the CHG Soap.                9.  Pat yourself dry with a clean towel.            10.  Wear clean pajamas.            11.  Place clean sheets on your bed the night of your first shower and do not  sleep with pets. Day of Surgery : Do not apply any lotions/deodorants the morning of surgery.  Please wear clean clothes to the hospital/surgery center.  FAILURE TO FOLLOW THESE INSTRUCTIONS MAY RESULT IN THE CANCELLATION OF YOUR SURGERY  PATIENT SIGNATURE_________________________________  NURSE SIGNATURE__________________________________  ________________________________________________________________________

## 2020-04-03 NOTE — Progress Notes (Signed)
Chief Complaint:   OBESITY Erin [name] is here to discuss her progress with her obesity treatment plan along with follow-up of her obesity related diagnoses. Erin [name] is on the Category 3 Plan and states she is following her eating plan approximately 50% of the time. Erin [name] states she is doing 0 minutes 0 times per week.  Today's visit was #: 3 Starting weight: 306 lbs Starting date: 03/08/2020 Today's weight: 299 lbs Today's date: 04/02/2020 Total lbs lost to date: 7 lbs Total lbs lost since last in-office visit: 1 lb  Interim History: In December 2021 while at work, Erin [name] sustained a  Fall and subsequent torn right rotator cuff. She is scheduled for right shoulder arthroscopy with rotator cuff repair with subacromial decompression on 04/08/2020 by Dr. Tamera Punt.   She is R hand dominant.  Subjective:   1. Vitamin D deficiency Erin [name]'s Vitamin D level was 17.3, below goal of 50, on 03/08/2020. She is currently taking prescription vitamin D 50,000 IU each week. She denies nausea, vomiting or muscle weakness.   Ref. Range 03/08/2020 10:01  Vitamin D, 25-Hydroxy Latest Ref Range: 30.0 - 100.0 ng/mL 17.3 (L)   2. Type 2 diabetes mellitus with other specified complication, without long-term current use of insulin ([place]) Erin [name] is on Metformin 500 mg with breakfast. She was started on Ozempic 0.25 mg on 03/22/2020. She has taken 2 doses and tolerating it well.   Lab Results  Component Value Date   HGBA1C 6.5 (A) 06/21/2019   HGBA1C 5.6 08/09/2018   HGBA1C 5.5 05/12/2017   Lab Results  Component Value Date   LDLCALC 164 (H) 03/08/2020   CREATININE 0.96 03/08/2020   Lab Results  Component Value Date   INSULIN 27.9 (H) 03/08/2020    3. Hypertension associated with type 2 diabetes mellitus (HCC) SBP is slightly elevated at OV. Erin [name] is on Benicar 40 mg daily, Norvasc 5 mg daily, Coreg 6.25 mg BID, HCTZ 25 mg daily.  BP Readings from Last 3 Encounters:  04/02/20  (!) 144/67  03/22/20 (!) 157/88  03/08/20 (!) 159/91   4. Tear of right rotator cuff, unspecified tear extent, unspecified whether traumatic In December 2021 while at work, Erin [name] sustained a torn right rotator cuff. She is scheduled for right shoulder arthroscopy with rotator cuff repair with subacromial decompression on 04/08/2020 with Dr. Tamera Punt.    5. At risk for constipation Erin [name] is at increased risk for constipation due to inadequate water intake, changes in diet, and/or use of medications such as GLP1 agonists. Erin [name] denies hard, infrequent stools currently. Erin [name] will be using a narcotic after right rotator cuff repair.  Assessment/Plan:   1. Vitamin D deficiency Low Vitamin D level contributes to fatigue and are associated with obesity, breast, and colon cancer. She agrees to continue to take prescription Vitamin D @50 ,000 IU every week and will follow-up for routine testing of Vitamin D, at least 2-3 times per year to avoid over-replacement.  - Vitamin D, Ergocalciferol, (DRISDOL) 1.25 MG (50000 UNIT) CAPS capsule; Take 1 capsule (50,000 Units total) by mouth every 7 (seven) days.  Dispense: 4 capsule; Refill: 0  2. Type 2 diabetes mellitus with other specified complication, without long-term current use of insulin (HCC) Good blood sugar control is important to decrease the likelihood of diabetic complications such as nephropathy, neuropathy, limb loss, blindness, coronary artery disease, and death. Intensive lifestyle modification including diet, exercise and weight loss are the first line of treatment for diabetes.   - Semaglutide,0.25 or 0.5MG /DOS, (  OZEMPIC, 0.25 OR 0.5 MG/DOSE,) 2 MG/1.5ML SOPN; Inject 0.25 mg into the skin once a week.  Dispense: 1.5 mL; Refill: 0  3. Hypertension associated with type 2 diabetes mellitus ([place]) Erin [name] is working on healthy weight loss and exercise to improve blood pressure control. We will watch for signs of hypotension as she  continues her lifestyle modifications.  4. Tear of right rotator cuff, unspecified tear extent, unspecified whether traumatic Increase protein.  Start using left arm for activities of daily living to practice for post surgical recovery limitations of RUE.  Follow up with surgeon as directed.  5. At risk for constipation Erin [name] was given approximately 15 minutes of counseling today regarding prevention of constipation. She was encouraged to increase water and fiber intake.   6. Class 3 severe obesity with serious comorbidity and body mass index (BMI) of 50.0 to 59.9 in adult, unspecified obesity type (HCC) Erin [name] is currently in the action stage of change. As such, her goal is to continue with weight loss efforts. She has agreed to the Category 3 Plan.   Have premier protein shake for breakfast.  Exercise goals: As is  Behavioral modification strategies: increasing lean protein intake, decreasing liquid calories, increasing high fiber foods, meal planning and cooking strategies and planning for success.  Erin [name] has agreed to follow-up with our clinic in 3 weeks. She was informed of the importance of frequent follow-up visits to maximize her success with intensive lifestyle modifications for her multiple health conditions.   Objective:   Blood pressure (!) 144/67, pulse 74, temperature 97.6 F (36.4 C), height 5\' 4"  (1.626 m), weight 299 lb (135.6 kg), SpO2 98 %. Body mass index is 51.32 kg/m.  General: Cooperative, alert, well developed, in no acute distress. HEENT: Conjunctivae and lids unremarkable. Cardiovascular: Regular rhythm.  Lungs: Normal work of breathing. Neurologic: No focal deficits.   Lab Results  Component Value Date   CREATININE 0.96 03/08/2020   BUN 21 03/08/2020   NA 141 03/08/2020   K 4.3 03/08/2020   CL 105 03/08/2020   CO2 25 03/08/2020   Lab Results  Component Value Date   ALT 23 03/08/2020   AST 17 03/08/2020   ALKPHOS 90 03/08/2020    BILITOT 0.3 03/08/2020   Lab Results  Component Value Date   HGBA1C 6.5 (A) 06/21/2019   HGBA1C 5.6 08/09/2018   HGBA1C 5.5 05/12/2017   HGBA1C 5.7 05/27/2016   Lab Results  Component Value Date   INSULIN 27.9 (H) 03/08/2020   Lab Results  Component Value Date   TSH 2.320 03/08/2020   Lab Results  Component Value Date   CHOL 228 (H) 03/08/2020   HDL 52 03/08/2020   LDLCALC 164 (H) 03/08/2020   TRIG 70 03/08/2020   CHOLHDL 3.1 06/21/2019   Lab Results  Component Value Date   WBC 6.8 03/13/2020   HGB 13.2 03/13/2020   HCT 39.4 03/13/2020   MCV 90 03/13/2020   PLT 271 03/13/2020    Attestation Statements:   Reviewed by clinician on day of visit: allergies, medications, problem list, medical history, surgical history, family history, social history, and previous encounter notes.  Coral Ceo, am acting as Location manager for Mina Marble, NP.  I have reviewed the above documentation for accuracy and completeness, and I agree with the above. -  Briella Hobday d. Ashlinn Hemrick, NP-C

## 2020-04-04 ENCOUNTER — Encounter (HOSPITAL_COMMUNITY): Payer: Self-pay

## 2020-04-04 ENCOUNTER — Encounter (HOSPITAL_COMMUNITY)
Admission: RE | Admit: 2020-04-04 | Discharge: 2020-04-04 | Disposition: A | Discharge status: Home / Self Care | Payer: PRIVATE HEALTH INSURANCE | Source: Ambulatory Visit | Attending: Orthopedic Surgery | Admitting: Orthopedic Surgery

## 2020-04-04 ENCOUNTER — Inpatient Hospital Stay (HOSPITAL_COMMUNITY)
Admission: RE | Admit: 2020-04-04 | Discharge: 2020-04-04 | Disposition: A | Discharge status: Home / Self Care | Payer: Self-pay | Source: Ambulatory Visit

## 2020-04-04 ENCOUNTER — Other Ambulatory Visit: Payer: Self-pay

## 2020-04-04 DIAGNOSIS — Z01812 Encounter for preprocedural laboratory examination: Secondary | ICD-10-CM | POA: Insufficient documentation

## 2020-04-04 DIAGNOSIS — M75101 Unspecified rotator cuff tear or rupture of right shoulder, not specified as traumatic: Secondary | ICD-10-CM | POA: Insufficient documentation

## 2020-04-04 LAB — BASIC METABOLIC PANEL
Anion gap: 9 (ref 5–15)
BUN: 27 mg/dL — ABNORMAL HIGH (ref 6–20)
CO2: 24 mmol/L (ref 22–32)
Calcium: 9.9 mg/dL (ref 8.9–10.3)
Chloride: 104 mmol/L (ref 98–111)
Creatinine, Ser: 0.93 mg/dL (ref 0.44–1.00)
GFR, Estimated: >60 mL/min (ref >60)
Glucose, Bld: 122 mg/dL — ABNORMAL HIGH (ref 70–99)
Potassium: 3.4 mmol/L — ABNORMAL LOW (ref 3.5–5.1)
Sodium: 137 mmol/L (ref 135–145)

## 2020-04-04 LAB — CBC
HCT: 40 % (ref 36.0–46.0)
Hemoglobin: 12.9 g/dL (ref 12.0–15.0)
MCH: 30.6 pg (ref 26.0–34.0)
MCHC: 32.3 g/dL (ref 30.0–36.0)
MCV: 94.8 fL (ref 80.0–100.0)
Platelets: 277 10*3/uL (ref 150–400)
RBC: 4.22 MIL/uL (ref 3.87–5.11)
RDW: 13.2 % (ref 11.5–15.5)
WBC: 7.4 10*3/uL (ref 4.0–10.5)
nRBC: 0 % (ref 0.0–0.2)

## 2020-04-04 LAB — HEMOGLOBIN A1C
Hgb A1c MFr Bld: 6.7 % — ABNORMAL HIGH (ref 4.8–5.6)
Mean Plasma Glucose: 145.59 mg/dL

## 2020-04-04 LAB — GLUCOSE, CAPILLARY: Glucose-Capillary: 133 mg/dL — ABNORMAL HIGH (ref 70–99)

## 2020-04-04 NOTE — Progress Notes (Signed)
Patient positive for covid in January 2022, no preprocedure retest due to 90 day policy.

## 2020-04-04 NOTE — Progress Notes (Addendum)
COVID Vaccine Completed:  x2 Date COVID Vaccine completed:  10-17-19, 11-07-19 Has received booster: COVID vaccine manufacturer: Pfizer       Date of COVID positive in last 90 days:  Tested positive 02-12-20, results on chart  PCP - Dr. Nolon Rod, MD Cardiologist - Dorris Carnes, MD  Cardiac clearance on chart dated 03-29-20 by Laurann Montana, NP  Chest x-ray - N/A EKG - 03-06-20 Epic Stress Test -  ECHO -  Cardiac Cath -  Pacemaker/ICD device last checked:  Sleep Study - 11-23-17 Epic, +sleep apnea CPAP - Yes  Fasting Blood Sugar -  Checks Blood Sugar - does not check  Blood Thinner Instructions:  N/A Aspirin Instructions: Last Dose:  Activity level:  Can go up a flight of stairs and perform  activities of daily living without stopping and without symptoms.       Anesthesia review:  Followed by cardiology for HTN and has OSA, DM.  Patient denies shortness of breath, fever, cough and chest pain at PAT appointment   Patient verbalized understanding of instructions that were given to them at the PAT appointment. Patient was also instructed that they will need to review over the PAT instructions again at home before surgery.

## 2020-04-07 MED ORDER — DEXTROSE 5 % IV SOLN
3.0000 g | INTRAVENOUS | Status: AC
  Administered 2020-04-08: 3 g via INTRAVENOUS
  Filled 2020-04-07: qty 3

## 2020-04-07 NOTE — Anesthesia Preprocedure Evaluation (Addendum)
Anesthesia Evaluation  Patient identified by MRN, date of birth, ID band Patient awake    Reviewed: Allergy & Precautions, NPO status , Patient's Chart, lab work & pertinent test results  Airway Mallampati: II  TM Distance: >3 FB Neck ROM: Full    Dental no notable dental hx. (+) Teeth Intact, Dental Advisory Given   Pulmonary sleep apnea and Continuous Positive Airway Pressure Ventilation , former smoker,  Tested + for covid 19 on 02-12-20   Pulmonary exam normal breath sounds clear to auscultation       Cardiovascular hypertension, Pt. on medications Normal cardiovascular exam Rhythm:Regular Rate:Normal     Neuro/Psych PSYCHIATRIC DISORDERS Anxiety    GI/Hepatic   Endo/Other  diabetes, Well Controlled, Type 2, Oral Hypoglycemic Agents  Renal/GU      Musculoskeletal   Abdominal (+) + obese,   Peds  Hematology Lab Results      Component                Value               Date                      WBC                      7.4                 04/04/2020                HGB                      12.9                04/04/2020                HCT                      40.0                04/04/2020                MCV                      94.8                04/04/2020                PLT                      277                 04/04/2020              Anesthesia Other Findings   Reproductive/Obstetrics                           Anesthesia Physical Anesthesia Plan  ASA: III  Anesthesia Plan: General   Post-op Pain Management:  Regional for Post-op pain   Induction:   PONV Risk Score and Plan: 4 or greater and Treatment may vary due to age or medical condition, Midazolam, Ondansetron and Dexamethasone  Airway Management Planned: Oral ETT and Video Laryngoscope Planned  Additional Equipment: None  Intra-op Plan:   Post-operative Plan: Extubation in OR  Informed Consent: I have reviewed the  patients History and Physical, chart, labs and discussed the  procedure including the risks, benefits and alternatives for the proposed anesthesia with the patient or authorized representative who has indicated his/her understanding and acceptance.     Dental advisory given  Plan Discussed with: CRNA and Anesthesiologist  Anesthesia Plan Comments: (GA w R ISB )       Anesthesia Quick Evaluation

## 2020-04-08 ENCOUNTER — Ambulatory Visit (INDEPENDENT_AMBULATORY_CARE_PROVIDER_SITE_OTHER): Payer: 59 | Admitting: Adult Health

## 2020-04-08 ENCOUNTER — Encounter (HOSPITAL_COMMUNITY)
Admission: RE | Disposition: A | Discharge status: Home / Self Care | Payer: Self-pay | Source: Home / Self Care | Attending: Orthopedic Surgery

## 2020-04-08 ENCOUNTER — Ambulatory Visit (HOSPITAL_COMMUNITY)
Admission: RE | Admit: 2020-04-08 | Discharge: 2020-04-08 | Disposition: A | Discharge status: Home / Self Care | Payer: PRIVATE HEALTH INSURANCE | Attending: Orthopedic Surgery | Admitting: Orthopedic Surgery

## 2020-04-08 ENCOUNTER — Ambulatory Visit (HOSPITAL_COMMUNITY): Payer: PRIVATE HEALTH INSURANCE | Admitting: Anesthesiology

## 2020-04-08 ENCOUNTER — Encounter (HOSPITAL_COMMUNITY): Payer: Self-pay | Admitting: Orthopedic Surgery

## 2020-04-08 ENCOUNTER — Other Ambulatory Visit (HOSPITAL_COMMUNITY): Payer: Self-pay | Admitting: Surgical

## 2020-04-08 ENCOUNTER — Ambulatory Visit (HOSPITAL_COMMUNITY): Payer: PRIVATE HEALTH INSURANCE | Admitting: Physician Assistant

## 2020-04-08 DIAGNOSIS — Z7984 Long term (current) use of oral hypoglycemic drugs: Secondary | ICD-10-CM | POA: Insufficient documentation

## 2020-04-08 DIAGNOSIS — E785 Hyperlipidemia, unspecified: Secondary | ICD-10-CM | POA: Insufficient documentation

## 2020-04-08 DIAGNOSIS — Z79899 Other long term (current) drug therapy: Secondary | ICD-10-CM | POA: Diagnosis not present

## 2020-04-08 DIAGNOSIS — I10 Essential (primary) hypertension: Secondary | ICD-10-CM | POA: Insufficient documentation

## 2020-04-08 DIAGNOSIS — Z8249 Family history of ischemic heart disease and other diseases of the circulatory system: Secondary | ICD-10-CM | POA: Diagnosis not present

## 2020-04-08 DIAGNOSIS — Y990 Civilian activity done for income or pay: Secondary | ICD-10-CM | POA: Diagnosis not present

## 2020-04-08 DIAGNOSIS — Z87891 Personal history of nicotine dependence: Secondary | ICD-10-CM | POA: Insufficient documentation

## 2020-04-08 DIAGNOSIS — W19XXXA Unspecified fall, initial encounter: Secondary | ICD-10-CM | POA: Diagnosis not present

## 2020-04-08 DIAGNOSIS — Z791 Long term (current) use of non-steroidal anti-inflammatories (NSAID): Secondary | ICD-10-CM | POA: Diagnosis not present

## 2020-04-08 DIAGNOSIS — G4733 Obstructive sleep apnea (adult) (pediatric): Secondary | ICD-10-CM | POA: Diagnosis not present

## 2020-04-08 DIAGNOSIS — E119 Type 2 diabetes mellitus without complications: Secondary | ICD-10-CM | POA: Diagnosis not present

## 2020-04-08 DIAGNOSIS — S46021A Laceration of muscle(s) and tendon(s) of the rotator cuff of right shoulder, initial encounter: Secondary | ICD-10-CM | POA: Insufficient documentation

## 2020-04-08 HISTORY — PX: SHOULDER ARTHROSCOPY WITH ROTATOR CUFF REPAIR AND SUBACROMIAL DECOMPRESSION: SHX5686

## 2020-04-08 LAB — GLUCOSE, CAPILLARY
Glucose-Capillary: 89 mg/dL (ref 70–99)
Glucose-Capillary: 72 mg/dL (ref 70–99)

## 2020-04-08 SURGERY — SHOULDER ARTHROSCOPY WITH ROTATOR CUFF REPAIR AND SUBACROMIAL DECOMPRESSION
Anesthesia: General | Laterality: Right

## 2020-04-08 MED ORDER — PHENYLEPHRINE HCL-NACL 10-0.9 MG/250ML-% IV SOLN
INTRAVENOUS | Status: DC | PRN
  Administered 2020-04-08: 25 ug/min via INTRAVENOUS

## 2020-04-08 MED ORDER — ROCURONIUM BROMIDE 10 MG/ML (PF) SYRINGE
PREFILLED_SYRINGE | INTRAVENOUS | Status: DC | PRN
  Administered 2020-04-08: 100 mg via INTRAVENOUS

## 2020-04-08 MED ORDER — DEXAMETHASONE SODIUM PHOSPHATE 10 MG/ML IJ SOLN
INTRAMUSCULAR | Status: DC | PRN
  Administered 2020-04-08: 5 mg via INTRAVENOUS

## 2020-04-08 MED ORDER — PROPOFOL 10 MG/ML IV BOLUS
INTRAVENOUS | Status: DC | PRN
  Administered 2020-04-08: 200 mg via INTRAVENOUS

## 2020-04-08 MED ORDER — OXYCODONE HCL 5 MG PO TABS: 5.0000 mg | ORAL_TABLET | Freq: Once | ORAL | Status: AC | PRN

## 2020-04-08 MED ORDER — LACTATED RINGERS IV SOLN: INTRAVENOUS | Status: DC

## 2020-04-08 MED ORDER — LIDOCAINE HCL (PF) 2 % IJ SOLN
INTRAMUSCULAR | Status: AC
  Filled 2020-04-08: qty 5

## 2020-04-08 MED ORDER — BUPIVACAINE LIPOSOME 1.3 % IJ SUSP
INTRAMUSCULAR | Status: DC | PRN
  Administered 2020-04-08: 10 mL via PERINEURAL

## 2020-04-08 MED ORDER — ONDANSETRON HCL 4 MG/2ML IJ SOLN
INTRAMUSCULAR | Status: AC
  Filled 2020-04-08: qty 2

## 2020-04-08 MED ORDER — OXYCODONE HCL 5 MG/5ML PO SOLN: 5.0000 mg | Freq: Once | ORAL | Status: AC | PRN

## 2020-04-08 MED ORDER — LIDOCAINE 2% (20 MG/ML) 5 ML SYRINGE
INTRAMUSCULAR | Status: DC | PRN
  Administered 2020-04-08: 20 mg via INTRAVENOUS

## 2020-04-08 MED ORDER — AMISULPRIDE (ANTIEMETIC) 5 MG/2ML IV SOLN: 10.0000 mg | Freq: Once | INTRAVENOUS | Status: DC | PRN

## 2020-04-08 MED ORDER — FENTANYL CITRATE (PF) 100 MCG/2ML IJ SOLN
INTRAMUSCULAR | Status: AC
  Administered 2020-04-08: 50 ug
  Filled 2020-04-08: qty 2

## 2020-04-08 MED ORDER — EPINEPHRINE PF 1 MG/ML IJ SOLN
INTRAMUSCULAR | Status: AC
  Filled 2020-04-08: qty 2

## 2020-04-08 MED ORDER — BUPIVACAINE HCL (PF) 0.5 % IJ SOLN
INTRAMUSCULAR | Status: DC | PRN
  Administered 2020-04-08: 15 mL via PERINEURAL

## 2020-04-08 MED ORDER — MIDAZOLAM HCL 2 MG/2ML IJ SOLN
INTRAMUSCULAR | Status: AC
  Administered 2020-04-08: 2 mg
  Filled 2020-04-08: qty 2

## 2020-04-08 MED ORDER — FENTANYL CITRATE (PF) 250 MCG/5ML IJ SOLN
INTRAMUSCULAR | Status: DC | PRN
  Administered 2020-04-08 (×2): 50 ug via INTRAVENOUS

## 2020-04-08 MED ORDER — MIDAZOLAM HCL 5 MG/5ML IJ SOLN
INTRAMUSCULAR | Status: DC | PRN
  Administered 2020-04-08: 1 mg via INTRAVENOUS

## 2020-04-08 MED ORDER — OXYCODONE HCL 5 MG PO TABS
ORAL_TABLET | ORAL | Status: AC
  Administered 2020-04-08: 5 mg via ORAL
  Filled 2020-04-08: qty 1

## 2020-04-08 MED ORDER — PROPOFOL 10 MG/ML IV BOLUS
INTRAVENOUS | Status: AC
  Filled 2020-04-08: qty 20

## 2020-04-08 MED ORDER — OXYCODONE-ACETAMINOPHEN 5-325 MG PO TABS: ORAL_TABLET | ORAL | 0 refills | Status: DC

## 2020-04-08 MED ORDER — FENTANYL CITRATE (PF) 100 MCG/2ML IJ SOLN
INTRAMUSCULAR | Status: AC
  Administered 2020-04-08: 25 ug via INTRAVENOUS
  Filled 2020-04-08: qty 2

## 2020-04-08 MED ORDER — FENTANYL CITRATE (PF) 100 MCG/2ML IJ SOLN
25.0000 ug | INTRAMUSCULAR | Status: DC | PRN
Start: 2020-04-08 — End: 2020-04-08
  Administered 2020-04-08: 50 ug via INTRAVENOUS
  Administered 2020-04-08: 25 ug via INTRAVENOUS

## 2020-04-08 MED ORDER — SUGAMMADEX SODIUM 500 MG/5ML IV SOLN
INTRAVENOUS | Status: AC
  Filled 2020-04-08: qty 5

## 2020-04-08 MED ORDER — SUGAMMADEX SODIUM 500 MG/5ML IV SOLN
INTRAVENOUS | Status: DC | PRN
  Administered 2020-04-08: 400 mg via INTRAVENOUS

## 2020-04-08 MED ORDER — DEXAMETHASONE SODIUM PHOSPHATE 10 MG/ML IJ SOLN
INTRAMUSCULAR | Status: AC
  Filled 2020-04-08: qty 1

## 2020-04-08 MED ORDER — LACTATED RINGERS IR SOLN
Status: DC | PRN
  Administered 2020-04-08 (×2): 3000 mL

## 2020-04-08 MED ORDER — CHLORHEXIDINE GLUCONATE 0.12 % MT SOLN
15.0000 mL | Freq: Once | OROMUCOSAL | Status: AC
  Administered 2020-04-08: 15 mL via OROMUCOSAL

## 2020-04-08 MED ORDER — ONDANSETRON HCL 4 MG/2ML IJ SOLN
INTRAMUSCULAR | Status: DC | PRN
  Administered 2020-04-08: 4 mg via INTRAVENOUS

## 2020-04-08 MED ORDER — TIZANIDINE HCL 4 MG PO TABS: 4.0000 mg | ORAL_TABLET | Freq: Three times a day (TID) | ORAL | 1 refills | Status: DC | PRN

## 2020-04-08 MED ORDER — ORAL CARE MOUTH RINSE: 15.0000 mL | Freq: Once | OROMUCOSAL | Status: AC

## 2020-04-08 MED ORDER — ONDANSETRON HCL 4 MG/2ML IJ SOLN: 4.0000 mg | Freq: Once | INTRAMUSCULAR | Status: DC | PRN

## 2020-04-08 MED ORDER — MIDAZOLAM HCL 2 MG/2ML IJ SOLN
INTRAMUSCULAR | Status: AC
  Filled 2020-04-08: qty 2

## 2020-04-08 MED ORDER — PHENYLEPHRINE HCL (PRESSORS) 10 MG/ML IV SOLN
INTRAVENOUS | Status: AC
  Filled 2020-04-08: qty 2

## 2020-04-08 MED ORDER — FENTANYL CITRATE (PF) 100 MCG/2ML IJ SOLN
INTRAMUSCULAR | Status: AC
  Administered 2020-04-08: 50 ug via INTRAVENOUS
  Filled 2020-04-08: qty 2

## 2020-04-08 MED ORDER — ROCURONIUM BROMIDE 10 MG/ML (PF) SYRINGE
PREFILLED_SYRINGE | INTRAVENOUS | Status: AC
  Filled 2020-04-08: qty 10

## 2020-04-08 MED ORDER — BUPIVACAINE-EPINEPHRINE 0.25% -1:200000 IJ SOLN
INTRAMUSCULAR | Status: AC
  Filled 2020-04-08: qty 1

## 2020-04-08 MED ORDER — LACTATED RINGERS IV SOLN
INTRAVENOUS | Status: DC | PRN
  Administered 2020-04-08 (×2): 3000 mL

## 2020-04-08 MED ORDER — FENTANYL CITRATE (PF) 100 MCG/2ML IJ SOLN
INTRAMUSCULAR | Status: AC
  Filled 2020-04-08: qty 2

## 2020-04-08 MED FILL — OXYCODONE-APAP 5-325MG: 5-325 | 5 days supply | Qty: 30 | Fill #0

## 2020-04-08 MED FILL — tiZANidine HCL 4 MG TABS: 4 | 10 days supply | Qty: 30 | Fill #0

## 2020-04-08 SURGICAL SUPPLY — 51 items
ANCH SUT SWLK 19.1X4.75 VT (Anchor) ×5 IMPLANT
ANCHOR PEEK 4.75X19.1 SWLK C (Anchor) ×10 IMPLANT
BLADE SURG SZ11 CARB STEEL (BLADE) ×2 IMPLANT
BURR OVAL 8 FLU 4.0X13 (MISCELLANEOUS) ×2 IMPLANT
CANNULA TWIST IN 8.25X9CM (CANNULA) ×2 IMPLANT
COVER SURGICAL LIGHT HANDLE (MISCELLANEOUS) ×2 IMPLANT
COVER WAND RF STERILE (DRAPES) IMPLANT
CUTTER BONE 4.0MM X 13CM (MISCELLANEOUS) ×2 IMPLANT
DRAPE INCISE IOBAN 66X45 STRL (DRAPES) ×2 IMPLANT
DRAPE ORTHO SPLIT 77X108 STRL (DRAPES) ×2
DRAPE STERI 35X30 U-POUCH (DRAPES) ×2 IMPLANT
DRAPE SURG ORHT 6 SPLT 77X108 (DRAPES) ×1 IMPLANT
DRAPE U-SHAPE 47X51 STRL (DRAPES) ×6 IMPLANT
DRSG PAD ABDOMINAL 8X10 ST (GAUZE/BANDAGES/DRESSINGS) ×6 IMPLANT
DURAPREP 26ML APPLICATOR (WOUND CARE) ×2 IMPLANT
ELECT REM PT RETURN 15FT ADLT (MISCELLANEOUS) ×2 IMPLANT
GAUZE SPONGE 4X4 12PLY STRL (GAUZE/BANDAGES/DRESSINGS) ×2 IMPLANT
GAUZE XEROFORM 1X8 LF (GAUZE/BANDAGES/DRESSINGS) ×2 IMPLANT
GLOVE SRG 8 PF TXTR STRL LF DI (GLOVE) ×1 IMPLANT
GLOVE SURG ENC MOIS LTX SZ7.5 (GLOVE) ×2 IMPLANT
GLOVE SURG UNDER POLY LF SZ8 (GLOVE) ×2
GOWN STRL REUS W/TWL LRG LVL3 (GOWN DISPOSABLE) ×2 IMPLANT
GOWN STRL REUS W/TWL XL LVL3 (GOWN DISPOSABLE) ×2 IMPLANT
KIT BASIN OR (CUSTOM PROCEDURE TRAY) IMPLANT
KIT TURNOVER KIT A (KITS) ×2 IMPLANT
MANIFOLD NEPTUNE II (INSTRUMENTS) ×2 IMPLANT
NDL SAFETY ECLIPSE 18X1.5 (NEEDLE) ×1 IMPLANT
NEEDLE HYPO 18GX1.5 SHARP (NEEDLE) ×2
NEEDLE HYPO 25X1 1.5 SAFETY (NEEDLE) ×2 IMPLANT
NEEDLE SCORPION (NEEDLE) IMPLANT
NEEDLE SCORPION MULTI FIRE (NEEDLE) ×2 IMPLANT
NEEDLE SPNL 18GX3.5 QUINCKE PK (NEEDLE) ×2 IMPLANT
PACK SHOULDER (CUSTOM PROCEDURE TRAY) ×2 IMPLANT
PENCIL SMOKE EVACUATOR (MISCELLANEOUS) IMPLANT
PROBE BIPOLAR ATHRO 135MM 90D (MISCELLANEOUS) ×2 IMPLANT
PROTECTOR NERVE ULNAR (MISCELLANEOUS) ×2 IMPLANT
SLING ARM FOAM STRAP LRG (SOFTGOODS) ×2 IMPLANT
SLING ARM FOAM STRAP MED (SOFTGOODS) IMPLANT
SLING ARM IMMOBILIZER LRG (SOFTGOODS) ×2 IMPLANT
SLING ARM IMMOBILIZER MED (SOFTGOODS) IMPLANT
SUT ETHILON 3 0 PS 1 (SUTURE) ×2 IMPLANT
SUT PDS AB 1 CT1 27 (SUTURE) IMPLANT
SUT TIGER TAPE 7 IN WHITE (SUTURE) ×2 IMPLANT
SYR 3ML LL SCALE MARK (SYRINGE) ×2 IMPLANT
SYR CONTROL 10ML LL (SYRINGE) ×2 IMPLANT
TAPE CLOTH SURG 6X10 WHT LF (GAUZE/BANDAGES/DRESSINGS) ×2 IMPLANT
TAPE FIBER 2MM 7IN #2 BLUE (SUTURE) ×4 IMPLANT
TOWEL OR 17X26 10 PK STRL BLUE (TOWEL DISPOSABLE) ×2 IMPLANT
TOWEL OR NON WOVEN STRL DISP B (DISPOSABLE) ×2 IMPLANT
TUBING ARTHROSCOPY IRRIG 16FT (MISCELLANEOUS) IMPLANT
TUBING CONNECTING 10 (TUBING) ×2 IMPLANT

## 2020-04-08 NOTE — Anesthesia Procedure Notes (Signed)
Procedure Name: Intubation Date/Time: 04/08/2020 1:56 PM Performed by: Lollie Sails, CRNA Pre-anesthesia Checklist: Patient identified, Emergency Drugs available, Suction available, Patient being monitored and Timeout performed Patient Re-evaluated:Patient Re-evaluated prior to induction Oxygen Delivery Method: Circle system utilized Preoxygenation: Pre-oxygenation with 100% oxygen Induction Type: IV induction Ventilation: Mask ventilation without difficulty Laryngoscope Size: Miller and 3 Grade View: Grade I Tube type: Oral Tube size: 7.5 mm Number of attempts: 1 Airway Equipment and Method: Stylet Placement Confirmation: ETT inserted through vocal cords under direct vision,  positive ETCO2 and breath sounds checked- equal and bilateral Secured at: 23 cm Tube secured with: Tape Dental Injury: Teeth and Oropharynx as per pre-operative assessment

## 2020-04-08 NOTE — Discharge Instructions (Signed)
Discharge Instructions after Arthroscopic Shoulder Repair   A sling has been provided for you. Remain in your sling at all times. This includes sleeping in your sling.  Use ice on the shoulder intermittently over the first 48 hours after surgery.  Pain medicine has been prescribed for you.  Use your medicine liberally over the first 48 hours, and then you can begin to taper your use. You may take Extra Strength Tylenol or Tylenol only in place of the pain pills. DO NOT take ANY nonsteroidal anti-inflammatory pain medications: Advil, Motrin, Ibuprofen, Aleve, Naproxen, or Narprosyn.  You may remove your dressing after two days. If the incision sites are still moist, place a Band-Aid over the moist site(s). Change Band-Aids daily until dry.  You may shower 5 days after surgery. The incisions CANNOT get wet prior to 5 days. Simply allow the water to wash over the site and then pat dry. Do not rub the incisions. Make sure your axilla (armpit) is completely dry after showering.  Take one aspirin a day for 2 weeks after surgery, unless you have an aspirin sensitivity/ allergy or asthma.   Please call [phone] during normal business hours or [phone] after hours for any problems. Including the following:  - excessive redness of the incisions - drainage for more than 4 days - fever of more than 101.5 F  *Please note that pain medications will not be refilled after hours or on weekends.   General Anesthesia, Adult, Care After This sheet gives you information about how to care for yourself after your procedure. Your health care provider may also give you more specific instructions. If you have problems or questions, contact your health care provider. What can I expect after the procedure? After the procedure, the following side effects are common:  Pain or discomfort at the IV site.  Nausea.  Vomiting.  Sore throat.  Trouble concentrating.  Feeling cold or chills.  Feeling weak or  tired.  Sleepiness and fatigue.  Soreness and body aches. These side effects can affect parts of the body that were not involved in surgery. Follow these instructions at home: For the time period you were told by your health care provider:  Rest.  Do not participate in activities where you could fall or become injured.  Do not drive or use machinery.  Do not drink alcohol.  Do not take sleeping pills or medicines that cause drowsiness.  Do not make important decisions or sign legal documents.  Do not take care of children on your own.   Eating and drinking  Follow any instructions from your health care provider about eating or drinking restrictions.  When you feel hungry, start by eating small amounts of foods that are soft and easy to digest (bland), such as toast. Gradually return to your regular diet.  Drink enough fluid to keep your urine pale yellow.  If you vomit, rehydrate by drinking water, juice, or clear broth. General instructions  If you have sleep apnea, surgery and certain medicines can increase your risk for breathing problems. Follow instructions from your health care provider about wearing your sleep device: ? Anytime you are sleeping, including during daytime naps. ? While taking prescription pain medicines, sleeping medicines, or medicines that make you drowsy.  Have a responsible adult stay with you for the time you are told. It is important to have someone help care for you until you are awake and alert.  Return to your normal activities as told by your health care  provider. Ask your health care provider what activities are safe for you.  Take over-the-counter and prescription medicines only as told by your health care provider.  If you smoke, do not smoke without supervision.  Keep all follow-up visits as told by your health care provider. This is important. Contact a health care provider if:  You have nausea or vomiting that does not get better  with medicine.  You cannot eat or drink without vomiting.  You have pain that does not get better with medicine.  You are unable to pass urine.  You develop a skin rash.  You have a fever.  You have redness around your IV site that gets worse. Get help right away if:  You have difficulty breathing.  You have chest pain.  You have blood in your urine or stool, or you vomit blood. Summary  After the procedure, it is common to have a sore throat or nausea. It is also common to feel tired.  Have a responsible adult stay with you for the time you are told. It is important to have someone help care for you until you are awake and alert.  When you feel hungry, start by eating small amounts of foods that are soft and easy to digest (bland), such as toast. Gradually return to your regular diet.  Drink enough fluid to keep your urine pale yellow.  Return to your normal activities as told by your health care provider. Ask your health care provider what activities are safe for you. This information is not intended to replace advice given to you by your health care provider. Make sure you discuss any questions you have with your health care provider. Document Revised: 10/12/2019 Document Reviewed: 05/11/2019 Elsevier Patient Education  2021 [employer].

## 2020-04-08 NOTE — H&P (Signed)
Erin [name] is an 55 y.o. female.   Chief Complaint: R shoulder rotator cuff tear HPI:  S/p fall with R shoulder large RCT.  Past Medical History:  Diagnosis Date  . Anxiety   . Constipation   . Depression   . GERD (gastroesophageal reflux disease)    Pt states she no longer needs meds  . Hyperlipidemia   . Hypertension   . Menorrhagia   . OSA (obstructive sleep apnea) 11/25/2017  . OSA on CPAP   . Other fatigue   . Shortness of breath on exertion   . Type 2 diabetes mellitus ([place])   . Vitamin D deficiency   . Wears glasses     Past Surgical History:  Procedure Laterality Date  . BREAST EXCISIONAL BIOPSY    . DILITATION & CURRETTAGE/HYSTROSCOPY WITH NOVASURE ABLATION N/A 08/26/2015   Procedure: DILATATION & CURETTAGE/HYSTEROSCOPY WITH NOVASURE ABLATION;  Surgeon: [name], MD;  Location: [facility];  Service: Gynecology;  Laterality: N/A;  . EXCISIONAL LEFT BREAST BX  11-14-2007   benign  . LAPAROSCOPIC TUBAL LIGATION Bilateral 08/26/2015   Procedure: LAPAROSCOPIC TUBAL LIGATION;  Surgeon: [name], MD;  Location: [facility];  Service: Gynecology;  Laterality: Bilateral;  . THYROID LOBECTOMY Right 04-30-2008    Family History  Problem Relation Age of Onset  . Stroke Mother   . Diabetes Mother   . Hypertension Mother   . Obesity Mother   . Glaucoma Father   . Alcoholism Father   . Heart disease Paternal Grandmother        enlarged heart  . Diabetes Half-Sister   . Hypertension Half-Sister   . Colon cancer Neg Hx   . Esophageal cancer Neg Hx   . Stomach cancer Neg Hx   . Colon polyps Neg Hx   . Rectal cancer Neg Hx    Social History:  reports that she quit smoking about 21 years ago. Her smoking use included cigarettes. She quit after 15.00 years of use. She has never used smokeless tobacco. She reports that she does not drink alcohol and does not use drugs.  Allergies: No Known Allergies  Medications Prior to  Admission  Medication Sig Dispense Refill  . amLODipine (NORVASC) 5 MG tablet Take 1 tablet (5 mg total) by mouth daily. 90 tablet 3  . carvedilol (COREG) 6.25 MG tablet TAKE 1 TABLET BY MOUTH 2 TIMES DAILY WITH A MEAL. (Patient taking differently: Take 6.25 mg by mouth daily.) 180 tablet 3  . glycopyrrolate (ROBINUL) 1 MG tablet Take 1 tablet by mouth daily.  2  . hydrochlorothiazide (HYDRODIURIL) 25 MG tablet Take 1 tablet (25 mg total) by mouth daily. 90 tablet 3  . metFORMIN (GLUCOPHAGE) 500 MG tablet TAKE 1 TABLET (500 MG TOTAL) BY MOUTH DAILY WITH BREAKFAST. 90 tablet 1  . olmesartan (BENICAR) 40 MG tablet Take 1 tablet (40 mg total) by mouth daily. 90 tablet 3  . rosuvastatin (CRESTOR) 10 MG tablet Take 1 tablet (10 mg total) by mouth daily. 90 tablet 3  . blood glucose meter kit and supplies Use up to 3 times daily as directed. E11.9 1 each 0  . Blood Glucose Monitoring Suppl (FREESTYLE FREEDOM LITE) w/Device KIT E 11.9 Use to check blood glucose once daily 1 each 1  . glucose blood (FREESTYLE LITE) test strip Test blood glucose once a day. E11.9. Freestyle Lite 100 each 12  . Lancet Devices (LANCING DEVICE) MISC Use to check blood glucose as needed. E11.9  1 each 0  . Lancets (FREESTYLE) lancets E11.9 Use as instructed. Check blood glucose once daily 100 each 12  . meloxicam (MOBIC) 15 MG tablet Take 1 tablet (15 mg total) by mouth daily. 30 tablet 0  . Semaglutide,0.25 or 0.5MG/DOS, (OZEMPIC, 0.25 OR 0.5 MG/DOSE,) 2 MG/1.5ML SOPN Inject 0.25 mg into the skin once a week. 1.5 mL 0  . Vitamin D, Ergocalciferol, (DRISDOL) 1.25 MG (50000 UNIT) CAPS capsule Take 1 capsule (50,000 Units total) by mouth every 7 (seven) days. 4 capsule 0    Results for orders placed or performed during the hospital encounter of 04/08/20 (from the past 48 hour(s))  Glucose, capillary     Status: None   Collection Time: 04/08/20 11:29 AM  Result Value Ref Range   Glucose-Capillary 89 70 - 99 mg/dL    Comment:  Glucose reference range applies only to samples taken after fasting for at least 8 hours.   Comment 1 Notify RN    Comment 2 Document in Chart    No results found.  Review of Systems  All other systems reviewed and are negative.   Blood pressure 128/80, pulse 73, temperature 98.3 F (36.8 C), temperature source Oral, resp. rate (!) 0, height '5\' 4"'  (1.626 m), weight (!) 137.5 kg, SpO2 95 %. Physical Exam HENT:     Head: Atraumatic.  Eyes:     Extraocular Movements: Extraocular movements intact.  Cardiovascular:     Pulses: Normal pulses.  Pulmonary:     Effort: Pulmonary effort is normal.  Musculoskeletal:     Comments: R shoulder pain and weakness with RCT  Neurological:     Mental Status: She is alert.      Assessment/Plan S/p fall with R RCT Plan arth RCR Risks / benefits of surgery discussed Consent on chart  NPO for OR Preop antibiotics   Isabella Stalling, MD 04/08/2020, 1:41 PM

## 2020-04-08 NOTE — Op Note (Signed)
Procedure(s):   Erin [name] female 55 y.o. 04/08/2020    Preoperative diagnosis: #1 right shoulder large rotator cuff tear #2 right shoulder impingement with unfavorable acromial anatomy  Postoperative diagnosis: #1 right shoulder large rotator cuff tear #2 right shoulder impingement with unfavorable acromial anatomy #3 right shoulder proximal long head biceps tendon partial tear  Procedure(s) and Anesthesia Type: #1 right shoulder arthroscopic rotator cuff repair #2 right shoulder arthroscopic subacromial decompression #3 right shoulder proximal long head biceps tenotomy  Surgeon(s) and Role:    Tania Ade, MD - Primary     Surgeon: Isabella Stalling   Assistants: Jeanmarie Hubert PA-C (Erin [name] was present and scrubbed throughout the procedure and was essential in positioning, assisting with the camera and instrumentation,, and closure)  Anesthesia: General endotracheal anesthesia with preoperative interscalene block given by the attending anesthesiologist    Procedure Detail   Estimated Blood Loss: Min         Drains: none  Blood Given: none         Specimens: none        Complications:  * No complications entered in OR log *         Disposition: PACU - hemodynamically stable.         Condition: stable    Procedure:   INDICATIONS FOR SURGERY: The patient is 55 y.o. female who had a fall at work, suffering a large right shoulder rotator cuff tear, indicated for surgical treatment to decrease pain and restore function.  OPERATIVE FINDINGS: Examination under anesthesia: No significant stiffness or instability   DESCRIPTION OF PROCEDURE: The patient was identified in preoperative  holding area where I personally marked the operative site after  verifying site, side, and procedure with the patient. An interscalene block was given by the attending anesthesiologist the holding area.  The patient was taken back to the operating room where  general anesthesia was induced without complication and was placed in the beach-chair position with the back  elevated about 60 degrees and all extremities and head and neck carefully padded and  positioned.   The right upper extremity was then prepped and  draped in a standard sterile fashion. The appropriate time-out  procedure was carried out. The patient did receive IV antibiotics  within 30 minutes of incision.   A small posterior portal incision was made and the arthroscope was introduced into the joint. An anterior portal was then established above the subscapularis using needle localization. Small cannula was placed anteriorly. Diagnostic arthroscopy was then carried out.  The subscapularis was frayed but not torn.  The biceps tendon was pulled into the joint and noted to have extensive partial tearing involving more than 50% of the thickness of the tendon.  There was also degeneration and detachment of the superior labrum at the biceps origin.  Therefore through a small anterior incision curved Mayo scissors were used to release the biceps from the superior labrum allowing it to retract from the joint.  Glenohumeral joint surfaces were intact without significant chondromalacia.  The supraspinatus and infraspinatus were torn and retracted.  The arthroscope was then introduced into the subacromial space a standard lateral portal was established with needle localization. The shaver was used through the lateral portal to perform extensive bursectomy.   The torn edge of the supraspinatus and infraspinatus were identified.  The tendon quality appeared good.  The tuberosity was debrided down to a bleeding surface to promote healing.  The tendon was able to be pulled  over the tuberosity without undue tension and I felt that formal repair would be beneficial.  Therefore a large cannula was placed laterally.  The camera was moved to a posterior lateral viewing position.  A medial row of three 4.75  peek swivel lock anchors was placed percutaneously preloaded with fiber tape and Tiger tape.  These 6 suture strands were then passed evenly throughout the tear anterior to posterior.  The sutures were then brought over to 2 additional anchors in a lateral row securing the tendon down nicely over the prepared tuberosity without significant tension.  The #2 FiberWire in the anterior lateral anchor was used to secure additional tendon anteriorly.  The coracoacromial ligament was taken down off the anterior acromion with the ArthroCare exposing a large hooked anterolateral acromial spur. A high-speed bur was then used through the lateral portal to take down the anterior acromial spur from lateral to medial in a standard acromioplasty.  The acromioplasty was also viewed from the lateral portal and the bur was used as necessary to ensure that the acromion was completely flat from posterior to anterior.  The arthroscopic equipment was removed from the joint and the portals were closed with 3-0 nylon in an interrupted fashion. Sterile dressings were then applied including Xeroform 4 x 4's ABDs and tape. The patient was then allowed to awaken from general anesthesia, placed in a sling, transferred to the stretcher and taken to the recovery room in stable condition.   POSTOPERATIVE PLAN: The patient will be discharged home today and will followup in one week for suture removal and wound check.

## 2020-04-08 NOTE — Anesthesia Procedure Notes (Addendum)
Anesthesia Regional Block: Interscalene brachial plexus block   Pre-Anesthetic Checklist: ,, timeout performed, Correct Patient, Correct Site, Correct Laterality, Correct Procedure, Correct Position, site marked, Risks and benefits discussed,  Surgical consent,  Pre-op evaluation,  At surgeon's request and post-op pain management  Laterality: Upper and Right  Prep: Maximum Sterile Barrier Precautions used, chloraprep       Needles:  Injection technique: Single-shot  Needle Type: Echogenic Needle     Needle Length: 5cm  Needle Gauge: 21     Additional Needles:   Procedures:,,,, ultrasound used (permanent image in chart),,,,  Narrative:  Start time: 04/08/2020 12:56 PM End time: 04/08/2020 1:02 PM Injection made incrementally with aspirations every 5 mL.  Performed by: Personally  Anesthesiologist: Barnet Glasgow, MD  Additional Notes: Block assessed prior to procedure. Patient tolerated procedure well.

## 2020-04-08 NOTE — Progress Notes (Signed)
AssistedDr. Houser with right, ultrasound guided, interscalene  block. Side rails up, monitors on throughout procedure. See vital signs in flow sheet. Tolerated Procedure well.  

## 2020-04-08 NOTE — Transfer of Care (Signed)
Immediate Anesthesia Transfer of Care Note  Patient: Erin [name]  Procedure(s) Performed: SHOULDER ARTHROSCOPY WITH ROTATOR CUFF REPAIR AND SUBACROMIAL DECOMPRESSION (Right )  Patient Location: PACU  Anesthesia Type:GA combined with regional for post-op pain  Level of Consciousness: awake, alert  and patient cooperative  Airway & Oxygen Therapy: Patient Spontanous Breathing and Patient connected to face mask oxygen  Post-op Assessment: Report given to RN and Post -op Vital signs reviewed and stable  Post vital signs: Reviewed and stable  Last Vitals:  Vitals Value Taken Time  BP 137/75 04/08/20 1530  Temp    Pulse 59 04/08/20 1532  Resp 15 04/08/20 1532  SpO2 100 % 04/08/20 1532  Vitals shown include unvalidated device data.  Last Pain:  Vitals:   04/08/20 1152  TempSrc:   PainSc: 0-No pain         Complications: No complications documented.

## 2020-04-09 ENCOUNTER — Encounter (HOSPITAL_COMMUNITY): Payer: Self-pay | Admitting: Orthopedic Surgery

## 2020-04-09 NOTE — Anesthesia Postprocedure Evaluation (Signed)
Anesthesia Post Note  Patient: Erin [name]  Procedure(s) Performed: SHOULDER ARTHROSCOPY WITH ROTATOR CUFF REPAIR AND SUBACROMIAL DECOMPRESSION (Right )     Patient location during evaluation: PACU Anesthesia Type: General Level of consciousness: awake and alert Pain management: pain level controlled Vital Signs Assessment: post-procedure vital signs reviewed and stable Respiratory status: spontaneous breathing, nonlabored ventilation, respiratory function stable and patient connected to nasal cannula oxygen Cardiovascular status: blood pressure returned to baseline and stable Postop Assessment: no apparent nausea or vomiting Anesthetic complications: no   No complications documented.  Last Vitals:  Vitals:   04/08/20 1630 04/08/20 1646  BP: 133/73 117/64  Pulse:  64  Resp:  19  Temp:  36.4 C  SpO2: 97% 94%    Last Pain:  Vitals:   04/08/20 1646  TempSrc: Oral  PainSc: 2                  Catilyn Boggus S

## 2020-04-15 ENCOUNTER — Encounter (HOSPITAL_COMMUNITY): Payer: Self-pay | Admitting: Orthopedic Surgery

## 2020-04-23 ENCOUNTER — Other Ambulatory Visit: Payer: Self-pay

## 2020-04-23 ENCOUNTER — Encounter (INDEPENDENT_AMBULATORY_CARE_PROVIDER_SITE_OTHER): Payer: Self-pay | Admitting: Adult Health

## 2020-04-23 ENCOUNTER — Telehealth (INDEPENDENT_AMBULATORY_CARE_PROVIDER_SITE_OTHER): Payer: 59 | Admitting: Adult Health

## 2020-04-23 ENCOUNTER — Other Ambulatory Visit (INDEPENDENT_AMBULATORY_CARE_PROVIDER_SITE_OTHER): Payer: Self-pay | Admitting: Adult Health

## 2020-04-23 DIAGNOSIS — Z6841 Body Mass Index (BMI) 40.0 and over, adult: Secondary | ICD-10-CM | POA: Diagnosis not present

## 2020-04-23 DIAGNOSIS — E1169 Type 2 diabetes mellitus with other specified complication: Secondary | ICD-10-CM | POA: Diagnosis not present

## 2020-04-23 DIAGNOSIS — E559 Vitamin D deficiency, unspecified: Secondary | ICD-10-CM

## 2020-04-23 MED ORDER — OZEMPIC (0.25 OR 0.5 MG/DOSE) 2 MG/1.5ML ~~LOC~~ SOPN: 0.2500 mg | PEN_INJECTOR | SUBCUTANEOUS | 0 refills | Status: DC

## 2020-04-23 MED ORDER — VITAMIN D (ERGOCALCIFEROL) 1.25 MG (50000 UNIT) PO CAPS: 50000.0000 [IU] | ORAL_CAPSULE | ORAL | 0 refills | Status: DC

## 2020-04-24 NOTE — Progress Notes (Signed)
TeleHealth Visit:  Due to the COVID-19 pandemic, this visit was completed with telemedicine (audio/video) technology to reduce patient and provider exposure as well as to preserve personal protective equipment.   Erin [name] has verbally consented to this TeleHealth visit. The patient is located at home, the provider is located at the [facility] office. The participants in this visit include the listed provider and patient. The visit was conducted today via video.   Chief Complaint: OBESITY Erin [name] is here to discuss her progress with her obesity treatment plan along with follow-up of her obesity related diagnoses. Erin [name] is on the Category 3 Plan and states she is following her eating plan approximately 0% of the time. Erin [name] states she is doing 0 minutes 0 times per week.  Today's visit was #: 4 Starting weight: 306 lbs Starting date: 03/08/2020  Interim History: Erin [name] underwent R shoulder arthroscopy with rotator cuff repair and subacromial decompression. She will have her 1st post-op with surgeon tomorrow, 04/23/2020. She used oxycodone/acetaminophen only the 1st week post-op. Now she is taking tizanidine 4 mg- once every other day. She is using ice often. Current R shoulder pain 5/10 with constant ache.  Subjective:   1. Vitamin D deficiency Erin [name]'s Vitamin D level was well below goal of 50, at 17.3 on 03/08/2020. She is currently taking prescription vitamin D 50,000 IU each week. She denies nausea, vomiting or muscle weakness.  2. Type 2 diabetes mellitus with other specified complication, without long-term current use of insulin (Erin [name]) Taron is on Metformin 500 mg QD and Ozempic 0.25 mg once a week. She has not been checking ambulatory BG. She denies episodes of hypoglycemia.  Lab Results  Component Value Date   HGBA1C 6.7 (H) 04/04/2020   HGBA1C 6.5 (A) 06/21/2019   HGBA1C 5.6 08/09/2018   Lab Results  Component Value Date   LDLCALC 164 (H)  03/08/2020   CREATININE 0.93 04/04/2020   Lab Results  Component Value Date   INSULIN 27.9 (H) 03/08/2020    Assessment/Plan:   1. Vitamin D deficiency Low Vitamin D level contributes to fatigue and are associated with obesity, breast, and colon cancer. She agrees to continue to take prescription Vitamin D @50 ,000 IU every week and will follow-up for routine testing of Vitamin D, at least 2-3 times per year to avoid over-replacement.  - Vitamin D, Ergocalciferol, (DRISDOL) 1.25 MG (50000 UNIT) CAPS capsule; Take 1 capsule (50,000 Units total) by mouth every 7 (seven) days.  Dispense: 4 capsule; Refill: 0  2. Type 2 diabetes mellitus with other specified complication, without long-term current use of insulin (HCC) Good blood sugar control is important to decrease the likelihood of diabetic complications such as nephropathy, neuropathy, limb loss, blindness, coronary artery disease, and death. Intensive lifestyle modification including diet, exercise and weight loss are the first line of treatment for diabetes.   - Semaglutide,0.25 or 0.5MG /DOS, (OZEMPIC, 0.25 OR 0.5 MG/DOSE,) 2 MG/1.5ML SOPN; Inject 0.25 mg into the skin once a week.  Dispense: 1.5 mL; Refill: 0  3. Class 3 severe obesity with serious comorbidity and body mass index (BMI) of 50.0 to 59.9 in adult, unspecified obesity type (HCC) Erin [name] is currently in the action stage of change. As such, her goal is to continue with weight loss efforts. She has agreed to the Category 3 Plan and increase protein intake.   Exercise goals: No exercise has been prescribed at this time.  Behavioral modification strategies: increasing lean protein intake, decreasing simple carbohydrates, no skipping  meals, meal planning and cooking strategies and planning for success.  Erin [name] has agreed to follow-up with our clinic in 2 weeks. She was informed of the importance of frequent follow-up visits to maximize her success with intensive lifestyle  modifications for her multiple health conditions.  Objective:   VITALS: Per patient if applicable, see vitals. GENERAL: Alert and in no acute distress. CARDIOPULMONARY: No increased WOB. Speaking in clear sentences.  PSYCH: Pleasant and cooperative. Speech normal rate and rhythm. Affect is appropriate. Insight and judgement are appropriate. Attention is focused, linear, and appropriate.  NEURO: Oriented as arrived to appointment on time with no prompting.   Lab Results  Component Value Date   CREATININE 0.93 04/04/2020   BUN 27 (H) 04/04/2020   NA 137 04/04/2020   K 3.4 (L) 04/04/2020   CL 104 04/04/2020   CO2 24 04/04/2020   Lab Results  Component Value Date   ALT 23 03/08/2020   AST 17 03/08/2020   ALKPHOS 90 03/08/2020   BILITOT 0.3 03/08/2020   Lab Results  Component Value Date   HGBA1C 6.7 (H) 04/04/2020   HGBA1C 6.5 (A) 06/21/2019   HGBA1C 5.6 08/09/2018   HGBA1C 5.5 05/12/2017   HGBA1C 5.7 05/27/2016   Lab Results  Component Value Date   INSULIN 27.9 (H) 03/08/2020   Lab Results  Component Value Date   TSH 2.320 03/08/2020   Lab Results  Component Value Date   CHOL 228 (H) 03/08/2020   HDL 52 03/08/2020   LDLCALC 164 (H) 03/08/2020   TRIG 70 03/08/2020   CHOLHDL 3.1 06/21/2019   Lab Results  Component Value Date   WBC 7.4 04/04/2020   HGB 12.9 04/04/2020   HCT 40.0 04/04/2020   MCV 94.8 04/04/2020   PLT 277 04/04/2020   No results found for: IRON, TIBC, FERRITIN  Attestation Statements:   Reviewed by clinician on day of visit: allergies, medications, problem list, medical history, surgical history, family history, social history, and previous encounter notes.  Time spent on visit including pre-visit chart review and post-visit charting and care was 34 minutes.   Coral Ceo, am acting as Location manager for Mina Marble, NP.  I have reviewed the above documentation for accuracy and completeness, and I agree with the above. - Myalynn Lingle d.  Delanna Blacketer, NP-C

## 2020-04-30 ENCOUNTER — Other Ambulatory Visit (HOSPITAL_COMMUNITY): Payer: Self-pay

## 2020-04-30 MED FILL — CHLORHEXIDINE 0.12% RINSE: 0.12 | 18 days supply | Qty: 473 | Fill #0

## 2020-05-07 ENCOUNTER — Other Ambulatory Visit (INDEPENDENT_AMBULATORY_CARE_PROVIDER_SITE_OTHER): Payer: Self-pay | Admitting: Adult Health

## 2020-05-07 ENCOUNTER — Other Ambulatory Visit: Payer: Self-pay

## 2020-05-07 ENCOUNTER — Ambulatory Visit (INDEPENDENT_AMBULATORY_CARE_PROVIDER_SITE_OTHER): Payer: 59 | Admitting: Adult Health

## 2020-05-07 ENCOUNTER — Encounter (INDEPENDENT_AMBULATORY_CARE_PROVIDER_SITE_OTHER): Payer: Self-pay | Admitting: Adult Health

## 2020-05-07 VITALS — BP 113/78 | HR 79 | Temp 98.1°F | Ht 64.0 in | Wt 291.0 lb

## 2020-05-07 DIAGNOSIS — Z9189 Other specified personal risk factors, not elsewhere classified: Secondary | ICD-10-CM

## 2020-05-07 DIAGNOSIS — Z6841 Body Mass Index (BMI) 40.0 and over, adult: Secondary | ICD-10-CM

## 2020-05-07 DIAGNOSIS — E559 Vitamin D deficiency, unspecified: Secondary | ICD-10-CM

## 2020-05-07 DIAGNOSIS — E1169 Type 2 diabetes mellitus with other specified complication: Secondary | ICD-10-CM

## 2020-05-07 MED ORDER — OZEMPIC (0.25 OR 0.5 MG/DOSE) 2 MG/1.5ML ~~LOC~~ SOPN: 0.5000 mg | PEN_INJECTOR | SUBCUTANEOUS | 0 refills | Status: DC

## 2020-05-07 MED ORDER — VITAMIN D (ERGOCALCIFEROL) 1.25 MG (50000 UNIT) PO CAPS: 50000.0000 [IU] | ORAL_CAPSULE | ORAL | 0 refills | Status: DC

## 2020-05-07 MED FILL — OZEMPIC 0.25 OR 0.5 MG/DOSE: 2 | 56 days supply | Qty: 3 | Fill #0

## 2020-05-08 NOTE — Progress Notes (Signed)
Chief Complaint:   OBESITY Erin [name] is here to discuss her progress with her obesity treatment plan along with follow-up of her obesity related diagnoses. Erin [name] is on the Category 3 Plan and states she is following her eating plan approximately 50% of the time. Erin [name] states she is doing 0 minutes 0 times per week.  Today's visit was #: 5 Starting weight: 306 lbs Starting date: 03/08/2020 Today's weight: 291 lbs Today's date: 05/07/2020 Total lbs lost to date: 15 lbs Total lbs lost since last in-office visit: 8 lbs  Interim History: Erin [name] is participating in outpatient PT 3 times a week (s/p R shoulder arthroscopy with rotator cuff repair and subacromial decompression).  She is experiencing L finger numbness on R hand- discussed with surgeon. She has been able to stay on plan during recovery. She is down 8 lbs since her last OV.  Subjective:   1. Type 2 diabetes mellitus with other specified complication, without long-term current use of insulin (HCC) Erin [name]'s 04/04/2020 A1c was 6.7 with elevated BG 122. She is on Ozempic 0.25 mg once week - been on this dose since 03/22/2020 (when GLP-1 was initiated).  She is unable to tolerate higher dose of Metformin 500 mg QD due to nausea.  Lab Results  Component Value Date   HGBA1C 6.7 (H) 04/04/2020   HGBA1C 6.5 (A) 06/21/2019   HGBA1C 5.6 08/09/2018   Lab Results  Component Value Date   LDLCALC 164 (H) 03/08/2020   CREATININE 0.93 04/04/2020   Lab Results  Component Value Date   INSULIN 27.9 (H) 03/08/2020    2. Vitamin D deficiency Erin [name]'s Vitamin D level was 17.3 on 03/08/2020. She is currently taking prescription vitamin D 50,000 IU each week. She denies nausea, vomiting or muscle weakness.  3. At risk for constipation Erin [name] is at risk for constipation due to GLP-1 for type 2 diabetes.  Assessment/Plan:   1. Type 2 diabetes mellitus with other specified complication, without long-term current use of  insulin (HCC) Good blood sugar control is important to decrease the likelihood of diabetic complications such as nephropathy, neuropathy, limb loss, blindness, coronary artery disease, and death. Intensive lifestyle modification including diet, exercise and weight loss are the first line of treatment for diabetes. Increase Ozempic dose from 0.25mg  to 0.5mg  once per week.  - Semaglutide,0.25 or 0.5MG /DOS, (OZEMPIC, 0.25 OR 0.5 MG/DOSE,) 2 MG/1.5ML SOPN; Inject 0.5 mg into the skin once a week.  Dispense: 3 mL; Refill: 0  2. Vitamin D deficiency Low Vitamin D level contributes to fatigue and are associated with obesity, breast, and colon cancer. She agrees to continue to take prescription Vitamin D @50 ,000 IU every week and will follow-up for routine testing of Vitamin D, at least 2-3 times per year to avoid over-replacement.  - Vitamin D, Ergocalciferol, (DRISDOL) 1.25 MG (50000 UNIT) CAPS capsule; Take 1 capsule (50,000 Units total) by mouth every 7 (seven) days.  Dispense: 4 capsule; Refill: 0  3. At risk for constipation Erin [name] was given approximately 15 minutes of counseling today regarding prevention of constipation. She was encouraged to increase water and fiber intake.   4. Class 3 severe obesity with serious comorbidity and body mass index (BMI) of 50.0 to 59.9 in adult, unspecified obesity type (HCC) Erin [name] is currently in the action stage of change. As such, her goal is to continue with weight loss efforts. She has agreed to the Category 3 Plan.   Fasting labs at the end of May.  Exercise goals:  PT- right shoulder. Increase daily walking.  Behavioral modification strategies: increasing lean protein intake, meal planning and cooking strategies and keeping a strict food journal.  Erin [name] has agreed to follow-up with our clinic in 2 weeks. She was informed of the importance of frequent follow-up visits to maximize her success with intensive lifestyle modifications for her multiple  health conditions.   Objective:   Blood pressure 113/78, pulse 79, temperature 98.1 F (36.7 C), height 5\' 4"  (1.626 m), weight 291 lb (132 kg), SpO2 98 %. Body mass index is 49.95 kg/m.  General: Cooperative, alert, well developed, in no acute distress. HEENT: Conjunctivae and lids unremarkable. Cardiovascular: Regular rhythm.  Lungs: Normal work of breathing. Neurologic: No focal deficits.   Lab Results  Component Value Date   CREATININE 0.93 04/04/2020   BUN 27 (H) 04/04/2020   NA 137 04/04/2020   K 3.4 (L) 04/04/2020   CL 104 04/04/2020   CO2 24 04/04/2020   Lab Results  Component Value Date   ALT 23 03/08/2020   AST 17 03/08/2020   ALKPHOS 90 03/08/2020   BILITOT 0.3 03/08/2020   Lab Results  Component Value Date   HGBA1C 6.7 (H) 04/04/2020   HGBA1C 6.5 (A) 06/21/2019   HGBA1C 5.6 08/09/2018   HGBA1C 5.5 05/12/2017   HGBA1C 5.7 05/27/2016   Lab Results  Component Value Date   INSULIN 27.9 (H) 03/08/2020   Lab Results  Component Value Date   TSH 2.320 03/08/2020   Lab Results  Component Value Date   CHOL 228 (H) 03/08/2020   HDL 52 03/08/2020   LDLCALC 164 (H) 03/08/2020   TRIG 70 03/08/2020   CHOLHDL 3.1 06/21/2019   Lab Results  Component Value Date   WBC 7.4 04/04/2020   HGB 12.9 04/04/2020   HCT 40.0 04/04/2020   MCV 94.8 04/04/2020   PLT 277 04/04/2020    Attestation Statements:   Reviewed by clinician on day of visit: allergies, medications, problem list, medical history, surgical history, family history, social history, and previous encounter notes.  Coral Ceo, am acting as Location manager for Mina Marble, NP.  I have reviewed the above documentation for accuracy and completeness, and I agree with the above. -  Erin Milbourn d. Millie Forde, NP-C

## 2020-05-20 ENCOUNTER — Other Ambulatory Visit (HOSPITAL_COMMUNITY): Payer: Self-pay

## 2020-05-20 MED FILL — Ergocalciferol Cap 1.25 MG (50000 Unit): ORAL | 28 days supply | Qty: 4 | Fill #0 | Status: AC

## 2020-05-22 ENCOUNTER — Other Ambulatory Visit: Payer: Self-pay

## 2020-05-22 ENCOUNTER — Other Ambulatory Visit (HOSPITAL_COMMUNITY): Payer: Self-pay

## 2020-05-22 ENCOUNTER — Ambulatory Visit (INDEPENDENT_AMBULATORY_CARE_PROVIDER_SITE_OTHER): Payer: 59 | Admitting: Adult Health

## 2020-05-22 ENCOUNTER — Encounter (INDEPENDENT_AMBULATORY_CARE_PROVIDER_SITE_OTHER): Payer: Self-pay | Admitting: Adult Health

## 2020-05-22 VITALS — BP 90/67 | HR 82 | Temp 97.9°F | Ht 64.0 in | Wt 289.0 lb

## 2020-05-22 DIAGNOSIS — E1169 Type 2 diabetes mellitus with other specified complication: Secondary | ICD-10-CM | POA: Diagnosis not present

## 2020-05-22 DIAGNOSIS — Z6841 Body Mass Index (BMI) 40.0 and over, adult: Secondary | ICD-10-CM

## 2020-05-22 DIAGNOSIS — I152 Hypertension secondary to endocrine disorders: Secondary | ICD-10-CM

## 2020-05-22 DIAGNOSIS — E1159 Type 2 diabetes mellitus with other circulatory complications: Secondary | ICD-10-CM

## 2020-05-22 MED ORDER — NAPROXEN 500 MG PO TABS
500.0000 mg | ORAL_TABLET | Freq: Two times a day (BID) | ORAL | 0 refills | Status: DC
  Filled 2020-05-22: qty 30, 15d supply, fill #0

## 2020-05-22 MED ORDER — GABAPENTIN 100 MG PO CAPS
ORAL_CAPSULE | ORAL | 0 refills | Status: DC
  Filled 2020-05-22: qty 45, 8d supply, fill #0

## 2020-05-23 NOTE — Progress Notes (Signed)
Chief Complaint:   OBESITY Erin [name] is here to discuss her progress with her obesity treatment plan along with follow-up of her obesity related diagnoses. Erin [name] is on the Category 3 Plan and states she is following her eating plan approximately 75% of the time. Erin [name] states she is not currently exercising.  Today's visit was #: 6 Starting weight: 306 lbs Starting date: 03/08/2020 Today's weight: 289 lbs Today's date: 05/22/2020 Total lbs lost to date: 17 lbs Total lbs lost since last in-office visit: 2  Interim History: Erin [name] reports when she deviates from category 3 meal plan (25% deviation) she will skip a full meal- either not eat or choose 100 calorie snack for "a meal". She will occasionally exceed max by 300 calories/day.  Subjective:   1. Type 2 diabetes mellitus with other specified complication, without long-term current use of insulin (HCC) 04/04/2020 A1c 6.7 with elevated BG at 122. Erin [name] is on Metformin 500 mg QD (unable to tolerate higher dose) and Ozempic 0.5 mg once weekly.  Lab Results  Component Value Date   HGBA1C 6.7 (H) 04/04/2020   HGBA1C 6.5 (A) 06/21/2019   HGBA1C 5.6 08/09/2018   Lab Results  Component Value Date   LDLCALC 164 (H) 03/08/2020   CREATININE 0.93 04/04/2020   Lab Results  Component Value Date   INSULIN 27.9 (H) 03/08/2020    2. Hypertension associated with type 2 diabetes mellitus ([place]) Erin [name]'s BP is soft today. She denies chest pain, dyspnea, or dizziness. She denies increased fatigue. She is on Norvasc 5 mg QD , Coreg 6.25 mg BID, HCTZ 25 mg QD, and Benicar 40 mg QD.   BP Readings from Last 3 Encounters:  05/22/20 90/67  05/07/20 113/78  04/08/20 117/64    Assessment/Plan:   1. Type 2 diabetes mellitus with other specified complication, without long-term current use of insulin (HCC) Good blood sugar control is important to decrease the likelihood of diabetic complications such as nephropathy, neuropathy,  limb loss, blindness, coronary artery disease, and death. Intensive lifestyle modification including diet, exercise and weight loss are the first line of treatment for diabetes. Continue current anti-diabetic regimen.  2. Hypertension associated with type 2 diabetes mellitus ([place]) Erin [name] is working on healthy weight loss and exercise to improve blood pressure control. We will watch for signs of hypotension as she continues her lifestyle modifications. Monitor ambulatory BP and for sx's of hypotension.  3. Obesity with current BMI 49.7 Erin [name] is currently in the action stage of change. As such, her goal is to continue with weight loss efforts. She has agreed to the Category 3 Plan.   Change lunch and dinner timing.  Exercise goals: Outpatient PT for right shoulder  Behavioral modification strategies: increasing lean protein intake, decreasing simple carbohydrates, no skipping meals, meal planning and cooking strategies, better snacking choices and planning for success.  Erin [name] has agreed to follow-up with our clinic in 3 weeks. She was informed of the importance of frequent follow-up visits to maximize her success with intensive lifestyle modifications for her multiple health conditions.   Objective:   Blood pressure 90/67, pulse 82, temperature 97.9 F (36.6 C), height 5\' 4"  (1.626 m), weight 289 lb (131.1 kg), SpO2 98 %. Body mass index is 49.61 kg/m.  General: Cooperative, alert, well developed, in no acute distress. HEENT: Conjunctivae and lids unremarkable. Cardiovascular: Regular rhythm.  Lungs: Normal work of breathing. Neurologic: No focal deficits.   Lab Results  Component Value Date   CREATININE 0.93 04/04/2020  BUN 27 (H) 04/04/2020   NA 137 04/04/2020   K 3.4 (L) 04/04/2020   CL 104 04/04/2020   CO2 24 04/04/2020   Lab Results  Component Value Date   ALT 23 03/08/2020   AST 17 03/08/2020   ALKPHOS 90 03/08/2020   BILITOT 0.3 03/08/2020   Lab Results   Component Value Date   HGBA1C 6.7 (H) 04/04/2020   HGBA1C 6.5 (A) 06/21/2019   HGBA1C 5.6 08/09/2018   HGBA1C 5.5 05/12/2017   HGBA1C 5.7 05/27/2016   Lab Results  Component Value Date   INSULIN 27.9 (H) 03/08/2020   Lab Results  Component Value Date   TSH 2.320 03/08/2020   Lab Results  Component Value Date   CHOL 228 (H) 03/08/2020   HDL 52 03/08/2020   LDLCALC 164 (H) 03/08/2020   TRIG 70 03/08/2020   CHOLHDL 3.1 06/21/2019   Lab Results  Component Value Date   WBC 7.4 04/04/2020   HGB 12.9 04/04/2020   HCT 40.0 04/04/2020   MCV 94.8 04/04/2020   PLT 277 04/04/2020    Attestation Statements:   Reviewed by clinician on day of visit: allergies, medications, problem list, medical history, surgical history, family history, social history, and previous encounter notes.  Time spent on visit including pre-visit chart review and post-visit care and charting was 31 minutes.   Coral Ceo, am acting as Location manager for Mina Marble, NP.  I have reviewed the above documentation for accuracy and completeness, and I agree with the above. -  Laurian Edrington d. Mikolaj Woolstenhulme, NP-C

## 2020-05-24 ENCOUNTER — Telehealth: Payer: Self-pay | Admitting: Internal Medicine

## 2020-05-24 NOTE — Telephone Encounter (Signed)
Patient called to say on Wednesday she had a blood pressure reading of 90/67. She wants to know if the Dr. Harrington Challenger wants her to stop taking any of her medication since her pressure was so low. Please advise

## 2020-06-10 ENCOUNTER — Other Ambulatory Visit (HOSPITAL_COMMUNITY): Payer: Self-pay

## 2020-06-10 MED ORDER — GABAPENTIN 100 MG PO CAPS
100.0000 mg | ORAL_CAPSULE | ORAL | 0 refills | Status: DC
  Filled 2020-06-10: qty 20, 7d supply, fill #0
  Filled 2020-06-14: qty 20, 3d supply, fill #0

## 2020-06-10 MED ORDER — CYCLOBENZAPRINE HCL 10 MG PO TABS
10.0000 mg | ORAL_TABLET | Freq: Every evening | ORAL | 0 refills | Status: DC | PRN
  Filled 2020-06-10: qty 20, 20d supply, fill #0

## 2020-06-13 ENCOUNTER — Other Ambulatory Visit: Payer: Self-pay

## 2020-06-13 ENCOUNTER — Ambulatory Visit (INDEPENDENT_AMBULATORY_CARE_PROVIDER_SITE_OTHER): Payer: 59 | Admitting: Family Medicine

## 2020-06-13 ENCOUNTER — Encounter (INDEPENDENT_AMBULATORY_CARE_PROVIDER_SITE_OTHER): Payer: Self-pay | Admitting: Family Medicine

## 2020-06-13 ENCOUNTER — Other Ambulatory Visit (HOSPITAL_COMMUNITY): Payer: Self-pay

## 2020-06-13 VITALS — BP 136/86 | HR 83 | Temp 98.4°F | Ht 64.0 in | Wt 289.0 lb

## 2020-06-13 DIAGNOSIS — I10 Essential (primary) hypertension: Secondary | ICD-10-CM

## 2020-06-13 DIAGNOSIS — Z9189 Other specified personal risk factors, not elsewhere classified: Secondary | ICD-10-CM | POA: Diagnosis not present

## 2020-06-13 DIAGNOSIS — Z6841 Body Mass Index (BMI) 40.0 and over, adult: Secondary | ICD-10-CM

## 2020-06-13 DIAGNOSIS — E559 Vitamin D deficiency, unspecified: Secondary | ICD-10-CM

## 2020-06-13 DIAGNOSIS — E66813 Obesity, class 3: Secondary | ICD-10-CM

## 2020-06-13 MED ORDER — VITAMIN D (ERGOCALCIFEROL) 1.25 MG (50000 UNIT) PO CAPS
ORAL_CAPSULE | ORAL | 0 refills | Status: DC
Start: 2020-06-13 — End: 2020-07-15
  Filled 2020-06-13: qty 4, 28d supply, fill #0

## 2020-06-14 ENCOUNTER — Other Ambulatory Visit (HOSPITAL_COMMUNITY): Payer: Self-pay

## 2020-06-17 NOTE — Progress Notes (Signed)
Chief Complaint:   OBESITY Erin [name] is here to discuss her progress with her obesity treatment plan along with follow-up of her obesity related diagnoses. Erin [name] is on the Category 3 Plan and states she is following her eating plan approximately 85% of the time. Erin [name] states she is doing 0 minutes 0 times per week.  Today's visit was #: 7 Starting weight: 306 lbs Starting date: 03/08/2020 Today's weight: 289 lbs Today's date: 06/13/2020 Total lbs lost to date: 17 Total lbs lost since last in-office visit: 0  Interim History: Bonnetta has a few more challenges since her last visit. She ate out a few times but she tried to make some better choices. She is sometimes too tired to cook at night and she is skipping some meals.  Subjective:   1. Vitamin D deficiency Erin [name] is on Vit D, but her Vit D level is not yet at goal.  2. Essential hypertension Erin [name] is on Norvasc and Coreg, and she is working on diet and exercise.  3. At risk for impaired metabolic function Erin [name] is at increased risk for impaired metabolic function due to skipping meals.  Assessment/Plan:   1. Vitamin D deficiency Low Vitamin D level contributes to fatigue and are associated with obesity, breast, and colon cancer. We will refill prescription Vitamin D for 1 month. Erin [name] will follow-up for routine testing of Vitamin D, at least 2-3 times per year to avoid over-replacement.  - Vitamin D, Ergocalciferol, (DRISDOL) 1.25 MG (50000 UNIT) CAPS capsule; TAKE 1 CAPSULE (50,000 UNITS TOTAL) BY MOUTH EVERY 7 (SEVEN) DAYS.  Dispense: 4 capsule; Refill: 0  2. Essential hypertension Erin [name] will continue with diet and exercise to improve blood pressure control. We recheck her blood pressure in 2 weeks.  3. At risk for impaired metabolic function Erin [name] was given approximately 15 minutes of impaired  metabolic function prevention counseling today. We discussed intensive lifestyle modifications  today with an emphasis on specific nutrition and exercise instructions and strategies.   Repetitive spaced learning was employed today to elicit superior memory formation and behavioral change.  4. Obesity with current BMI 49.6 Erin [name] is currently in the action stage of change. As such, her goal is to continue with weight loss efforts. She has agreed to the Category 3 Plan and keeping a food journal and adhering to recommended goals of 400-550 calories and 35+ grams of protein at supper daily.   Behavioral modification strategies: increasing lean protein intake and no skipping meals.  Erin [name] has agreed to follow-up with our clinic in 2 weeks. She was informed of the importance of frequent follow-up visits to maximize her success with intensive lifestyle modifications for her multiple health conditions.   Objective:   Blood pressure 136/86, pulse 83, temperature 98.4 F (36.9 C), height 5\' 4"  (1.626 m), weight 289 lb (131.1 kg), SpO2 97 %. Body mass index is 49.61 kg/m.  General: Cooperative, alert, well developed, in no acute distress. HEENT: Conjunctivae and lids unremarkable. Cardiovascular: Regular rhythm.  Lungs: Normal work of breathing. Neurologic: No focal deficits.   Lab Results  Component Value Date   CREATININE 0.93 04/04/2020   BUN 27 (H) 04/04/2020   NA 137 04/04/2020   K 3.4 (L) 04/04/2020   CL 104 04/04/2020   CO2 24 04/04/2020   Lab Results  Component Value Date   ALT 23 03/08/2020   AST 17 03/08/2020   ALKPHOS 90 03/08/2020   BILITOT 0.3 03/08/2020   Lab Results  Component Value Date  HGBA1C 6.7 (H) 04/04/2020   HGBA1C 6.5 (A) 06/21/2019   HGBA1C 5.6 08/09/2018   HGBA1C 5.5 05/12/2017   HGBA1C 5.7 05/27/2016   Lab Results  Component Value Date   INSULIN 27.9 (H) 03/08/2020   Lab Results  Component Value Date   TSH 2.320 03/08/2020   Lab Results  Component Value Date   CHOL 228 (H) 03/08/2020   HDL 52 03/08/2020   LDLCALC 164 (H)  03/08/2020   TRIG 70 03/08/2020   CHOLHDL 3.1 06/21/2019   Lab Results  Component Value Date   WBC 7.4 04/04/2020   HGB 12.9 04/04/2020   HCT 40.0 04/04/2020   MCV 94.8 04/04/2020   PLT 277 04/04/2020   No results found for: IRON, TIBC, FERRITIN  Attestation Statements:   Reviewed by clinician on day of visit: allergies, medications, problem list, medical history, surgical history, family history, social history, and previous encounter notes.   I, Trixie Dredge, am acting as transcriptionist for Dennard Nip, MD.  I have reviewed the above documentation for accuracy and completeness, and I agree with the above. -  Dennard Nip, MD

## 2020-06-18 ENCOUNTER — Other Ambulatory Visit (HOSPITAL_COMMUNITY): Payer: Self-pay

## 2020-06-19 ENCOUNTER — Other Ambulatory Visit (HOSPITAL_COMMUNITY): Payer: Self-pay

## 2020-06-25 ENCOUNTER — Other Ambulatory Visit (HOSPITAL_COMMUNITY): Payer: Self-pay

## 2020-06-25 MED FILL — Rosuvastatin Calcium Tab 10 MG: ORAL | 90 days supply | Qty: 90 | Fill #0 | Status: AC

## 2020-06-25 MED FILL — Hydrochlorothiazide Tab 25 MG: ORAL | 90 days supply | Qty: 90 | Fill #0 | Status: AC

## 2020-06-25 MED FILL — Olmesartan Medoxomil Tab 40 MG: ORAL | 90 days supply | Qty: 90 | Fill #0 | Status: AC

## 2020-06-25 MED FILL — Amlodipine Besylate Tab 5 MG (Base Equivalent): ORAL | 90 days supply | Qty: 90 | Fill #0 | Status: AC

## 2020-06-26 ENCOUNTER — Ambulatory Visit (INDEPENDENT_AMBULATORY_CARE_PROVIDER_SITE_OTHER): Payer: 59 | Admitting: Family Medicine

## 2020-06-26 ENCOUNTER — Other Ambulatory Visit: Payer: Self-pay

## 2020-06-26 ENCOUNTER — Encounter (INDEPENDENT_AMBULATORY_CARE_PROVIDER_SITE_OTHER): Payer: Self-pay | Admitting: Family Medicine

## 2020-06-26 VITALS — BP 137/84 | HR 80 | Temp 97.9°F | Ht 64.0 in | Wt 291.0 lb

## 2020-06-26 DIAGNOSIS — Z6841 Body Mass Index (BMI) 40.0 and over, adult: Secondary | ICD-10-CM

## 2020-06-26 DIAGNOSIS — E1169 Type 2 diabetes mellitus with other specified complication: Secondary | ICD-10-CM

## 2020-06-27 NOTE — Progress Notes (Signed)
Chief Complaint:   OBESITY Azyah is here to discuss her progress with her obesity treatment plan along with follow-up of her obesity related diagnoses. Robin is on the Category 3 Plan and keeping a food journal and adhering to recommended goals of 400-500 calories and 35+ grams of protein at supper daily and states she is following her eating plan approximately 50% of the time. Keiran states she is doing 0 minutes 0 times per week.  Today's visit was #: 8 Starting weight: 306 lbs Starting date: 03/08/2020 Today's weight: 291 lbs Today's date: 06/26/2020 Total lbs lost to date: 15 Total lbs lost since last in-office visit: 0  Interim History: Anairis is retaining some water weight today. She did more celebration eating but she was still mindful of her food choices. Her hunger has been mostly controlled, but she is getting especially bored with breakfast.  Subjective:   1. Type 2 diabetes mellitus with other specified complication, without long-term current use of insulin ([place]) Ariatna continues to do well with diet and weight loss overall. She has decreased simple carbohydrates, and she is working on increasing lean protein and vegetables. No signs of hypoglycemia.  Assessment/Plan:   1. Type 2 diabetes mellitus with other specified complication, without long-term current use of insulin ([place]) Denielle will continue metformin and Ozempic, and will continue to follow up as directed. Good blood sugar control is important to decrease the likelihood of diabetic complications such as nephropathy, neuropathy, limb loss, blindness, coronary artery disease, and death. Intensive lifestyle modification including diet, exercise and weight loss are the first line of treatment for diabetes.   2. Obesity with current BMI 50.0 Jodelle is currently in the action stage of change. As such, her goal is to continue with weight loss efforts. She has agreed to the Category 3 Plan and  keeping a food journal and adhering to recommended goals of 400-550 calories and 35+ grams of protein at lunch daily + breakfast options.   Behavioral modification strategies: increasing lean protein intake and dealing with family or coworker sabotage.  Rickesha has agreed to follow-up with our clinic in 2 to 3 weeks. She was informed of the importance of frequent follow-up visits to maximize her success with intensive lifestyle modifications for her multiple health conditions.   Objective:   Blood pressure 137/84, pulse 80, temperature 97.9 F (36.6 C), height 5\' 4"  (1.626 m), weight 291 lb (132 kg), SpO2 97 %. Body mass index is 49.95 kg/m.  General: Cooperative, alert, well developed, in no acute distress. HEENT: Conjunctivae and lids unremarkable. Cardiovascular: Regular rhythm.  Lungs: Normal work of breathing. Neurologic: No focal deficits.   Lab Results  Component Value Date   CREATININE 0.93 04/04/2020   BUN 27 (H) 04/04/2020   NA 137 04/04/2020   K 3.4 (L) 04/04/2020   CL 104 04/04/2020   CO2 24 04/04/2020   Lab Results  Component Value Date   ALT 23 03/08/2020   AST 17 03/08/2020   ALKPHOS 90 03/08/2020   BILITOT 0.3 03/08/2020   Lab Results  Component Value Date   HGBA1C 6.7 (H) 04/04/2020   HGBA1C 6.5 (A) 06/21/2019   HGBA1C 5.6 08/09/2018   HGBA1C 5.5 05/12/2017   HGBA1C 5.7 05/27/2016   Lab Results  Component Value Date   INSULIN 27.9 (H) 03/08/2020   Lab Results  Component Value Date   TSH 2.320 03/08/2020   Lab Results  Component Value Date   CHOL 228 (H) 03/08/2020  HDL 52 03/08/2020   LDLCALC 164 (H) 03/08/2020   TRIG 70 03/08/2020   CHOLHDL 3.1 06/21/2019   Lab Results  Component Value Date   WBC 7.4 04/04/2020   HGB 12.9 04/04/2020   HCT 40.0 04/04/2020   MCV 94.8 04/04/2020   PLT 277 04/04/2020   No results found for: IRON, TIBC, FERRITIN  Attestation Statements:   Reviewed by clinician on day of visit: allergies,  medications, problem list, medical history, surgical history, family history, social history, and previous encounter notes.  Time spent on visit including pre-visit chart review and post-visit care and charting was 30 minutes.    I, Trixie Dredge, am acting as transcriptionist for Dennard Nip, MD.  I have reviewed the above documentation for accuracy and completeness, and I agree with the above. -  Dennard Nip, MD

## 2020-07-10 ENCOUNTER — Other Ambulatory Visit (HOSPITAL_COMMUNITY): Payer: Self-pay

## 2020-07-10 MED ORDER — METFORMIN HCL 500 MG PO TABS
1.0000 | ORAL_TABLET | Freq: Every day | ORAL | 1 refills | Status: AC
  Filled 2020-07-10: qty 90, 90d supply, fill #0
  Filled 2020-11-14: qty 90, 90d supply, fill #1

## 2020-07-15 ENCOUNTER — Ambulatory Visit (INDEPENDENT_AMBULATORY_CARE_PROVIDER_SITE_OTHER): Payer: 59 | Admitting: Family Medicine

## 2020-07-15 ENCOUNTER — Other Ambulatory Visit: Payer: Self-pay

## 2020-07-15 ENCOUNTER — Encounter (INDEPENDENT_AMBULATORY_CARE_PROVIDER_SITE_OTHER): Payer: Self-pay | Admitting: Family Medicine

## 2020-07-15 VITALS — BP 118/81 | HR 81 | Temp 98.1°F | Ht 64.0 in | Wt 289.0 lb

## 2020-07-15 DIAGNOSIS — Z6841 Body Mass Index (BMI) 40.0 and over, adult: Secondary | ICD-10-CM

## 2020-07-15 DIAGNOSIS — Z9189 Other specified personal risk factors, not elsewhere classified: Secondary | ICD-10-CM

## 2020-07-15 DIAGNOSIS — E559 Vitamin D deficiency, unspecified: Secondary | ICD-10-CM

## 2020-07-15 DIAGNOSIS — E1169 Type 2 diabetes mellitus with other specified complication: Secondary | ICD-10-CM | POA: Diagnosis not present

## 2020-07-16 ENCOUNTER — Other Ambulatory Visit (HOSPITAL_COMMUNITY): Payer: Self-pay

## 2020-07-16 MED ORDER — VITAMIN D (ERGOCALCIFEROL) 1.25 MG (50000 UNIT) PO CAPS
50000.0000 [IU] | ORAL_CAPSULE | ORAL | 0 refills | Status: DC
  Filled 2020-07-16: qty 4, 28d supply, fill #0

## 2020-07-16 MED ORDER — SEMAGLUTIDE(0.25 OR 0.5MG/DOS) 2 MG/1.5ML ~~LOC~~ SOPN
0.5000 mg | PEN_INJECTOR | SUBCUTANEOUS | 0 refills | Status: DC
  Filled 2020-07-16: qty 3, 56d supply, fill #0

## 2020-07-23 NOTE — Progress Notes (Signed)
Chief Complaint:   OBESITY Erin [name] is here to discuss her progress with her obesity treatment plan along with follow-up of her obesity related diagnoses. Erin [name] is on the Category 3 Plan and keeping a food journal and adhering to recommended goals of 400-550 calories and 35+ grams of protein daily and states she is following her eating plan approximately 45% of the time. [name] states she is doing 0 minutes 0 times per week.  Today's visit was #: 9 Starting weight: 306 lbs Starting date: 03/08/2020 Today's weight: 289 lbs Today's date: 07/15/2020 Total lbs lost to date: 17 Total lbs lost since last in-office visit: 2  Interim History: [name] is back to losing weight. She is doing better with eating the food on her plan, but she is still snacking more than ideal.  Subjective:   1. Type 2 diabetes mellitus with other specified complication, without long-term current use of insulin ([place]) Alizay is tolerating Ozempic well. She has had some challenges with the needle bending on the pen, which is causing some mechanical problems with the pen.  2. Vitamin D deficiency Erin [name] is stable on Vit D, and she denies nausea or vomiting. She requests a refill.  3. At risk for dehydration Erin [name] is at risk for dehydration due to weight loss and increased heat.  Assessment/Plan:   1. Type 2 diabetes mellitus with other specified complication, without long-term current use of insulin ([facility]) We will refill Ozempic for 1 month. Erin [name] was shown the proper injection technique with a sample pen. Good blood sugar control is important to decrease the likelihood of diabetic complications such as nephropathy, neuropathy, limb loss, blindness, coronary artery disease, and death. Intensive lifestyle modification including diet, exercise and weight loss are the first line of treatment for diabetes.   - Semaglutide,0.25 or 0.5MG /DOS, 2 MG/1.5ML SOPN; Inject 0.5 mg into the skin once a  week.  Dispense: 3 mL; Refill: 0  2. Vitamin D deficiency Low Vitamin D level contributes to fatigue and are associated with obesity, breast, and colon cancer. We will refill prescription Vitamin D for 1 month. [name] will follow-up for routine testing of Vitamin D, at least 2-3 times per year to avoid over-replacement.  - Vitamin D, Ergocalciferol, (DRISDOL) 1.25 MG (50000 UNIT) CAPS capsule; Take 1 capsule (50,000 Units total) by mouth every 7 (seven) days.  Dispense: 4 capsule; Refill: 0  3. At risk for dehydration Erin [name] was given approximately 15 minutes dehydration prevention counseling today. Erin [name] is at risk for dehydration due to weight loss and current medication(s). She was encouraged to hydrate and monitor fluid status to avoid dehydration as well as weight loss plateaus.   4. Obesity with current BMI 49.7 Erin [name] is currently in the action stage of change. As such, her goal is to continue with weight loss efforts. She has agreed to the Category 3 Plan and keeping a food journal and adhering to recommended goals of 400-550 calories and 35+ grams of protein at lunch daily.   Smart snack options were discussed, and handout was given.  Exercise goals: All adults should avoid inactivity. Some physical activity is better than none, and adults who participate in any amount of physical activity gain some health benefits.  Behavioral modification strategies: better snacking choices.  [name] has agreed to follow-up with our clinic in 2 to 3 weeks. She was informed of the importance of frequent follow-up visits to maximize her success with intensive lifestyle modifications for her multiple health conditions.   Objective:  Blood pressure 118/81, pulse 81, temperature 98.1 F (36.7 C), height 5\' 4"  (1.626 m), weight 289 lb (131.1 kg), SpO2 97 %. Body mass index is 49.61 kg/m.  General: Cooperative, alert, well developed, in no acute distress. HEENT: Conjunctivae and lids  unremarkable. Cardiovascular: Regular rhythm.  Lungs: Normal work of breathing. Neurologic: No focal deficits.   Lab Results  Component Value Date   CREATININE 0.93 04/04/2020   BUN 27 (H) 04/04/2020   NA 137 04/04/2020   K 3.4 (L) 04/04/2020   CL 104 04/04/2020   CO2 24 04/04/2020   Lab Results  Component Value Date   ALT 23 03/08/2020   AST 17 03/08/2020   ALKPHOS 90 03/08/2020   BILITOT 0.3 03/08/2020   Lab Results  Component Value Date   HGBA1C 6.7 (H) 04/04/2020   HGBA1C 6.5 (A) 06/21/2019   HGBA1C 5.6 08/09/2018   HGBA1C 5.5 05/12/2017   HGBA1C 5.7 05/27/2016   Lab Results  Component Value Date   INSULIN 27.9 (H) 03/08/2020   Lab Results  Component Value Date   TSH 2.320 03/08/2020   Lab Results  Component Value Date   CHOL 228 (H) 03/08/2020   HDL 52 03/08/2020   LDLCALC 164 (H) 03/08/2020   TRIG 70 03/08/2020   CHOLHDL 3.1 06/21/2019   Lab Results  Component Value Date   WBC 7.4 04/04/2020   HGB 12.9 04/04/2020   HCT 40.0 04/04/2020   MCV 94.8 04/04/2020   PLT 277 04/04/2020   No results found for: IRON, TIBC, FERRITIN  Attestation Statements:   Reviewed by clinician on day of visit: allergies, medications, problem list, medical history, surgical history, family history, social history, and previous encounter notes.   I, Trixie Dredge, am acting as transcriptionist for Dennard Nip, MD.  I have reviewed the above documentation for accuracy and completeness, and I agree with the above. -  Dennard Nip, MD

## 2020-07-24 ENCOUNTER — Other Ambulatory Visit (HOSPITAL_COMMUNITY): Payer: Self-pay

## 2020-07-24 MED ORDER — MELOXICAM 15 MG PO TABS
1.0000 | ORAL_TABLET | Freq: Every day | ORAL | 1 refills | Status: DC
  Filled 2020-07-24: qty 30, 30d supply, fill #0

## 2020-07-29 ENCOUNTER — Other Ambulatory Visit (HOSPITAL_COMMUNITY): Payer: Self-pay

## 2020-07-29 ENCOUNTER — Ambulatory Visit (INDEPENDENT_AMBULATORY_CARE_PROVIDER_SITE_OTHER): Payer: 59 | Admitting: Family Medicine

## 2020-07-29 ENCOUNTER — Other Ambulatory Visit: Payer: Self-pay

## 2020-07-29 ENCOUNTER — Encounter (INDEPENDENT_AMBULATORY_CARE_PROVIDER_SITE_OTHER): Payer: Self-pay | Admitting: Family Medicine

## 2020-07-29 VITALS — BP 134/85 | HR 89 | Temp 98.0°F | Ht 64.0 in | Wt 289.0 lb

## 2020-07-29 DIAGNOSIS — Z6841 Body Mass Index (BMI) 40.0 and over, adult: Secondary | ICD-10-CM | POA: Diagnosis not present

## 2020-07-29 DIAGNOSIS — I10 Essential (primary) hypertension: Secondary | ICD-10-CM | POA: Diagnosis not present

## 2020-07-29 DIAGNOSIS — E1169 Type 2 diabetes mellitus with other specified complication: Secondary | ICD-10-CM | POA: Diagnosis not present

## 2020-07-29 DIAGNOSIS — Z9189 Other specified personal risk factors, not elsewhere classified: Secondary | ICD-10-CM | POA: Diagnosis not present

## 2020-07-29 MED ORDER — OZEMPIC (1 MG/DOSE) 4 MG/3ML ~~LOC~~ SOPN
1.0000 mg | PEN_INJECTOR | SUBCUTANEOUS | 0 refills | Status: DC
  Filled 2020-07-29: qty 3, 28d supply, fill #0

## 2020-07-30 NOTE — Progress Notes (Signed)
Chief Complaint:   OBESITY Erin [name] is here to discuss her progress with her obesity treatment plan along with follow-up of her obesity related diagnoses. Erin [name] is on the Category 3 Plan and keeping a food journal and adhering to recommended goals of 400-550 calories and 35+ grams of protein at lunch daily and states she is following her eating plan approximately 90% of the time. Erin [name] states she is doing 0 minutes 0 times per week.  Today's visit was #: 10 Starting weight: 306 lbs Starting date: 03/08/2020 Today's weight: 289 lbs Today's date: 07/29/2020 Total lbs lost to date: 17 Total lbs lost since last in-office visit: 0  Interim History: Erin [name] continues to work on diet and weight loss. She is dealing with some extra challenges including some increased hunger. She is working on making better choices and increasing vegetables and protein.  Subjective:   1. Type 2 diabetes mellitus with other specified complication, without long-term current use of insulin ([facility]) Erin [name] is on Ozempic 0.5 mg and she is still struggling with some polyphagia. She denies nausea or vomiting.  2. Essential hypertension Erin [name]'s blood pressure is stable on her medications, and she denies signs of hypotension.  3. At risk for dehydration Erin [name] is at risk for dehydration due to inadequate water intake.  Assessment/Plan:   1. Type 2 diabetes mellitus with other specified complication, without long-term current use of insulin ([place]) Erin [name] agreed to increase Ozempic to 1 mg q weekly with no refills. Good blood sugar control is important to decrease the likelihood of diabetic complications such as nephropathy, neuropathy, limb loss, blindness, coronary artery disease, and death. Intensive lifestyle modification including diet, exercise and weight loss are the first line of treatment for diabetes.   - Semaglutide, 1 MG/DOSE, (OZEMPIC, 1 MG/DOSE,) 4 MG/3ML SOPN; Inject 1 mg into the  skin once a week.  Dispense: 3 mL; Refill: 0  2. Essential hypertension Erin [name] will continue her medications, diet, and exercise to improve blood pressure control. We will watch for signs of hypotension as she continues her lifestyle modifications.  3. At risk for dehydration Erin [name] was given approximately 15 minutes dehydration prevention counseling today. Erin [name] is at risk for dehydration due to weight loss and current medication(s). She was encouraged to hydrate and monitor fluid status to avoid dehydration as well as weight loss plateaus.   4. Obesity with current BMI 49.7 Erin [name] is currently in the action stage of change. As such, her goal is to continue with weight loss efforts. She has agreed to the Category 3 Plan and keeping a food journal and adhering to recommended goals of 400-550 calories and 35+ grams of protein at lunch daily.   Behavioral modification strategies: increasing water intake.  Erin [name] has agreed to follow-up with our clinic in 2 to 3 weeks. She was informed of the importance of frequent follow-up visits to maximize her success with intensive lifestyle modifications for her multiple health conditions.   Objective:   Blood pressure 134/85, pulse 89, temperature 98 F (36.7 C), height 5\' 4"  (1.626 m), weight 289 lb (131.1 kg), SpO2 98 %. Body mass index is 49.61 kg/m.  General: Cooperative, alert, well developed, in no acute distress. HEENT: Conjunctivae and lids unremarkable. Cardiovascular: Regular rhythm.  Lungs: Normal work of breathing. Neurologic: No focal deficits.   Lab Results  Component Value Date   CREATININE 0.93 04/04/2020   BUN 27 (H) 04/04/2020   NA 137 04/04/2020   K 3.4 (L) 04/04/2020   CL  104 04/04/2020   CO2 24 04/04/2020   Lab Results  Component Value Date   ALT 23 03/08/2020   AST 17 03/08/2020   ALKPHOS 90 03/08/2020   BILITOT 0.3 03/08/2020   Lab Results  Component Value Date   HGBA1C 6.7 (H) 04/04/2020    HGBA1C 6.5 (A) 06/21/2019   HGBA1C 5.6 08/09/2018   HGBA1C 5.5 05/12/2017   HGBA1C 5.7 05/27/2016   Lab Results  Component Value Date   INSULIN 27.9 (H) 03/08/2020   Lab Results  Component Value Date   TSH 2.320 03/08/2020   Lab Results  Component Value Date   CHOL 228 (H) 03/08/2020   HDL 52 03/08/2020   LDLCALC 164 (H) 03/08/2020   TRIG 70 03/08/2020   CHOLHDL 3.1 06/21/2019   Lab Results  Component Value Date   WBC 7.4 04/04/2020   HGB 12.9 04/04/2020   HCT 40.0 04/04/2020   MCV 94.8 04/04/2020   PLT 277 04/04/2020   No results found for: IRON, TIBC, FERRITIN  Attestation Statements:   Reviewed by clinician on day of visit: allergies, medications, problem list, medical history, surgical history, family history, social history, and previous encounter notes.   I, Trixie Dredge, am acting as transcriptionist for Dennard Nip, MD.  I have reviewed the above documentation for accuracy and completeness, and I agree with the above. -  Dennard Nip, MD

## 2020-08-19 ENCOUNTER — Other Ambulatory Visit (HOSPITAL_COMMUNITY): Payer: Self-pay

## 2020-08-19 ENCOUNTER — Encounter (INDEPENDENT_AMBULATORY_CARE_PROVIDER_SITE_OTHER): Payer: Self-pay | Admitting: Family Medicine

## 2020-08-19 ENCOUNTER — Other Ambulatory Visit: Payer: Self-pay

## 2020-08-19 ENCOUNTER — Ambulatory Visit (INDEPENDENT_AMBULATORY_CARE_PROVIDER_SITE_OTHER): Payer: 59 | Admitting: Family Medicine

## 2020-08-19 VITALS — BP 141/83 | HR 79 | Temp 97.7°F | Ht 64.0 in | Wt 290.0 lb

## 2020-08-19 DIAGNOSIS — F3289 Other specified depressive episodes: Secondary | ICD-10-CM

## 2020-08-19 DIAGNOSIS — Z6841 Body Mass Index (BMI) 40.0 and over, adult: Secondary | ICD-10-CM | POA: Diagnosis not present

## 2020-08-19 DIAGNOSIS — Z9189 Other specified personal risk factors, not elsewhere classified: Secondary | ICD-10-CM | POA: Diagnosis not present

## 2020-08-19 DIAGNOSIS — E559 Vitamin D deficiency, unspecified: Secondary | ICD-10-CM | POA: Diagnosis not present

## 2020-08-19 DIAGNOSIS — E66813 Obesity, class 3: Secondary | ICD-10-CM

## 2020-08-19 DIAGNOSIS — E1169 Type 2 diabetes mellitus with other specified complication: Secondary | ICD-10-CM

## 2020-08-19 DIAGNOSIS — I10 Essential (primary) hypertension: Secondary | ICD-10-CM | POA: Diagnosis not present

## 2020-08-19 MED ORDER — BUPROPION HCL ER (SR) 150 MG PO TB12
150.0000 mg | ORAL_TABLET | Freq: Every morning | ORAL | 0 refills | Status: DC
  Filled 2020-08-19: qty 30, 30d supply, fill #0

## 2020-08-19 MED ORDER — VITAMIN D (ERGOCALCIFEROL) 1.25 MG (50000 UNIT) PO CAPS
50000.0000 [IU] | ORAL_CAPSULE | ORAL | 0 refills | Status: DC
  Filled 2020-08-19: qty 4, 28d supply, fill #0

## 2020-08-26 NOTE — Progress Notes (Signed)
Chief Complaint:   OBESITY Erin [name] is here to discuss her progress with her obesity treatment plan along with follow-up of her obesity related diagnoses. Erin [name] is on the Category 3 Plan and keeping a food journal and adhering to recommended goals of 400-500 calories and 35+ grams of protein at lunch daily and states she is following her eating plan approximately 50% of the time. Erin [name] states she is doing 0 minutes 0 times per week.  Today's visit was #: 11 Starting weight: 306 lbs Starting date: 03/08/2020 Today's weight: 290 lbs Today's date: 08/19/2020 Total lbs lost to date: 16 Total lbs lost since last in-office visit: 0  Interim History: Erin [name] has struggled more with increased snacking especially of sweets in the last 2 weeks. She is being mindful of her food choices and trying to meal plan and prep.  Subjective:   1. Vitamin D deficiency Erin [name] is stable on Vit D, but her level is not yet at goal.  2. Essential hypertension Erin [name]'s blood pressure is mildly elevated today, but is normally controlled.  3. Type 2 diabetes mellitus with other specified complication, without long-term current use of insulin (Erin [name]) Erin [name] was changed to Erin [name] and she is struggling to get into the habit of a weekly dose. She is making strategies to help her with this.  4. Other depression with emotional eating Erin [name] notes increased cravings and emotional eating behaviors. She is struggling to stay on track.  5. At risk for heart disease Erin [name] is at a higher than average risk for cardiovascular disease due to obesity.   Assessment/Plan:   1. Vitamin D deficiency Low Vitamin D level contributes to fatigue and are associated with obesity, breast, and colon cancer. We will refill prescription Vitamin D for 1 month. Erin [name] will follow-up for routine testing of Vitamin D, at least 2-3 times per year to avoid over-replacement.  - Vitamin D, Ergocalciferol,  (DRISDOL) 1.25 MG (50000 UNIT) CAPS capsule; Take 1 capsule (50,000 Units total) by mouth every 7 (seven) days.  Dispense: 4 capsule; Refill: 0  2. Essential hypertension Erin [name] will continue diet and exercise to improve blood pressure control. We will recheck her blood pressure in 2 weeks.  3. Type 2 diabetes mellitus with other specified complication, without long-term current use of insulin (Erin [name]) Erin [name] will continue Ozempic at 1 mg q weekly, and she will continue to follow up as directed. Good blood sugar control is important to decrease the likelihood of diabetic complications such as nephropathy, neuropathy, limb loss, blindness, coronary artery disease, and death. Intensive lifestyle modification including diet, exercise and weight loss are the first line of treatment for diabetes.   4. Other depression with emotional eating Behavior modification techniques were discussed today to help Erin [name] deal with her emotional/non-hunger eating behaviors. Erin [name] agreed to start Erin [name] with no refills. Orders and follow up as documented in patient record.   - buPROPion (Erin SR) 150 MG 12 hr tablet; Take 1 tablet (150 mg total) by mouth every morning.  Dispense: 30 tablet; Refill: 0  5. At risk for heart disease Erin [name] was given approximately 15 minutes of coronary artery disease prevention counseling today. She is 55 y.o. female and has risk factors for heart disease including obesity. We discussed intensive lifestyle modifications today with an emphasis on specific weight loss instructions and strategies.   Repetitive spaced learning was employed today to elicit superior memory formation and behavioral change.  6. Obesity with current BMI 49.9  Erin [name] is currently in the action stage of change. As such, her goal is to continue with weight loss efforts. She has agreed to the Category 3 Plan and keeping a food journal and adhering to recommended goals of  400-500 calories and 35+ grams of protein at lunch daily.   Behavioral modification strategies: increasing lean protein intake, no skipping meals, and emotional eating strategies.  Erin [name] has agreed to follow-up with our clinic in 2 to 3 weeks. She was informed of the importance of frequent follow-up visits to maximize her success with intensive lifestyle modifications for her multiple health conditions.   Objective:   Blood pressure (!) 141/83, pulse 79, temperature 97.7 F (36.5 C), height 5\' 4"  (1.626 m), weight 290 lb (131.5 kg), SpO2 98 %. Body mass index is 49.78 kg/m.  General: Cooperative, alert, well developed, in no acute distress. HEENT: Conjunctivae and lids unremarkable. Cardiovascular: Regular rhythm.  Lungs: Normal work of breathing. Neurologic: No focal deficits.   Lab Results  Component Value Date   CREATININE 0.93 04/04/2020   BUN 27 (H) 04/04/2020   NA 137 04/04/2020   K 3.4 (L) 04/04/2020   CL 104 04/04/2020   CO2 24 04/04/2020   Lab Results  Component Value Date   ALT 23 03/08/2020   AST 17 03/08/2020   ALKPHOS 90 03/08/2020   BILITOT 0.3 03/08/2020   Lab Results  Component Value Date   HGBA1C 6.7 (H) 04/04/2020   HGBA1C 6.5 (A) 06/21/2019   HGBA1C 5.6 08/09/2018   HGBA1C 5.5 05/12/2017   HGBA1C 5.7 05/27/2016   Lab Results  Component Value Date   INSULIN 27.9 (H) 03/08/2020   Lab Results  Component Value Date   TSH 2.320 03/08/2020   Lab Results  Component Value Date   CHOL 228 (H) 03/08/2020   HDL 52 03/08/2020   LDLCALC 164 (H) 03/08/2020   TRIG 70 03/08/2020   CHOLHDL 3.1 06/21/2019   Lab Results  Component Value Date   VD25OH 17.3 (L) 03/08/2020   Lab Results  Component Value Date   WBC 7.4 04/04/2020   HGB 12.9 04/04/2020   HCT 40.0 04/04/2020   MCV 94.8 04/04/2020   PLT 277 04/04/2020   No results found for: IRON, TIBC, FERRITIN  Attestation Statements:   Reviewed by clinician on day of visit: allergies,  medications, problem list, medical history, surgical history, family history, social history, and previous encounter notes.   I, Trixie Dredge, [name] acting as transcriptionist for Dennard Nip, MD.  I have reviewed the above documentation for accuracy and completeness, and I agree with the above. -  Dennard Nip, MD

## 2020-09-02 ENCOUNTER — Other Ambulatory Visit (HOSPITAL_COMMUNITY): Payer: Self-pay

## 2020-09-02 ENCOUNTER — Other Ambulatory Visit: Payer: Self-pay

## 2020-09-02 ENCOUNTER — Encounter (INDEPENDENT_AMBULATORY_CARE_PROVIDER_SITE_OTHER): Payer: Self-pay | Admitting: Family Medicine

## 2020-09-02 ENCOUNTER — Ambulatory Visit (INDEPENDENT_AMBULATORY_CARE_PROVIDER_SITE_OTHER): Payer: 59 | Admitting: Family Medicine

## 2020-09-02 VITALS — BP 139/80 | HR 89 | Temp 98.3°F | Ht 64.0 in | Wt 292.0 lb

## 2020-09-02 DIAGNOSIS — F3289 Other specified depressive episodes: Secondary | ICD-10-CM | POA: Diagnosis not present

## 2020-09-02 DIAGNOSIS — Z6841 Body Mass Index (BMI) 40.0 and over, adult: Secondary | ICD-10-CM

## 2020-09-02 DIAGNOSIS — I10 Essential (primary) hypertension: Secondary | ICD-10-CM

## 2020-09-02 DIAGNOSIS — Z9189 Other specified personal risk factors, not elsewhere classified: Secondary | ICD-10-CM | POA: Diagnosis not present

## 2020-09-02 MED ORDER — BUPROPION HCL ER (SR) 200 MG PO TB12
200.0000 mg | ORAL_TABLET | Freq: Every morning | ORAL | 0 refills | Status: DC
Start: 2020-09-02 — End: 2020-10-10
  Filled 2020-09-02: qty 30, 30d supply, fill #0

## 2020-09-06 NOTE — Progress Notes (Signed)
Chief Complaint:   OBESITY Erin [name] is here to discuss her progress with her obesity treatment plan along with follow-up of her obesity related diagnoses. Erin [name] is on the Category 3 Plan and keeping a food journal and adhering to recommended goals of 400-500 calories and 35+ grams of protein at lunch daily and states she is following her eating plan approximately 5% of the time. Erin [name] states she is doing 0 minutes 0 times per week.  Today's visit was #: 12 Starting weight: 306 lbs Starting date: 03/08/2020 Today's weight: 292 lbs Today's date: 09/02/2020 Total lbs lost to date: 14 Total lbs lost since last in-office visit: 0  Interim History: Erin [name] has gotten off track in the last 2 weeks. She has had increased stress and increased temptations. She was mindful and tried to portion control more often.  Subjective:   1. Essential hypertension Erin [name]'s blood pressure is borderline elevated today. She is on Benicar and hydrochlorothiazide. She denies chest pain or headache.  2. Other depression with emotional eating Erin [name] is stable on her medications, but she notes increased stress and she has gotten off track recently.  3. At risk for heart disease Erin [name] is at a higher than average risk for cardiovascular disease due to obesity.   Assessment/Plan:   1. Essential hypertension Erin [name] will continue diet, exercise, and her medications to improve blood pressure control. We will recheck her blood pressure in 2-3 weeks.  2. Other depression with emotional eating Behavior modification techniques were discussed today to help Erin [name] deal with her emotional/non-hunger eating behaviors. Erin [name] agreed to increase Wellbutrin SR to 200 mg q AM with no refills, we will will follow up in 2-3 weeks. Orders and follow up as documented in patient record.   - buPROPion (WELLBUTRIN SR) 200 MG 12 hr tablet; Take 1 tablet (200 mg total) by mouth every morning.  Dispense:  30 tablet; Refill: 0  3. At risk for heart disease Erin [name] was given approximately 15 minutes of coronary artery disease prevention counseling today. She is 55 y.o. female and has risk factors for heart disease including obesity. We discussed intensive lifestyle modifications today with an emphasis on specific weight loss instructions and strategies.   Repetitive spaced learning was employed today to elicit superior memory formation and behavioral change.  4. Obesity with current BMI 50.2 Erin [name] is currently in the action stage of change. As such, her goal is to continue with weight loss efforts. She has agreed to the Category 3 Plan and keeping a food journal and adhering to recommended goals of 400-500 calories and 35+ grams of protein at lunch daily.   Behavioral modification strategies: increasing lean protein intake, decreasing simple carbohydrates, increasing water intake, and emotional eating strategies.  Erin [name] has agreed to follow-up with our clinic in 2 to 3 weeks. She was informed of the importance of frequent follow-up visits to maximize her success with intensive lifestyle modifications for her multiple health conditions.   Objective:   Blood pressure 139/80, pulse 89, temperature 98.3 F (36.8 C), height 5\' 4"  (1.626 m), weight 292 lb (132.5 kg), SpO2 98 %. Body mass index is 50.12 kg/m.  General: Cooperative, alert, well developed, in no acute distress. HEENT: Conjunctivae and lids unremarkable. Cardiovascular: Regular rhythm.  Lungs: Normal work of breathing. Neurologic: No focal deficits.   Lab Results  Component Value Date   CREATININE 0.93 04/04/2020   BUN 27 (H) 04/04/2020   NA 137 04/04/2020   K 3.4 (L) 04/04/2020  CL 104 04/04/2020   CO2 24 04/04/2020   Lab Results  Component Value Date   ALT 23 03/08/2020   AST 17 03/08/2020   ALKPHOS 90 03/08/2020   BILITOT 0.3 03/08/2020   Lab Results  Component Value Date   HGBA1C 6.7 (H) 04/04/2020    HGBA1C 6.5 (A) 06/21/2019   HGBA1C 5.6 08/09/2018   HGBA1C 5.5 05/12/2017   HGBA1C 5.7 05/27/2016   Lab Results  Component Value Date   INSULIN 27.9 (H) 03/08/2020   Lab Results  Component Value Date   TSH 2.320 03/08/2020   Lab Results  Component Value Date   CHOL 228 (H) 03/08/2020   HDL 52 03/08/2020   LDLCALC 164 (H) 03/08/2020   TRIG 70 03/08/2020   CHOLHDL 3.1 06/21/2019   Lab Results  Component Value Date   VD25OH 17.3 (L) 03/08/2020   Lab Results  Component Value Date   WBC 7.4 04/04/2020   HGB 12.9 04/04/2020   HCT 40.0 04/04/2020   MCV 94.8 04/04/2020   PLT 277 04/04/2020   No results found for: IRON, TIBC, FERRITIN  Attestation Statements:   Reviewed by clinician on day of visit: allergies, medications, problem list, medical history, surgical history, family history, social history, and previous encounter notes.   I, Trixie Dredge, am acting as transcriptionist for Dennard Nip, MD.  I have reviewed the above documentation for accuracy and completeness, and I agree with the above. -  Dennard Nip, MD

## 2020-09-11 DIAGNOSIS — G4733 Obstructive sleep apnea (adult) (pediatric): Secondary | ICD-10-CM | POA: Diagnosis not present

## 2020-09-24 ENCOUNTER — Other Ambulatory Visit (HOSPITAL_COMMUNITY): Payer: Self-pay

## 2020-09-24 ENCOUNTER — Other Ambulatory Visit: Payer: Self-pay

## 2020-09-24 ENCOUNTER — Ambulatory Visit (INDEPENDENT_AMBULATORY_CARE_PROVIDER_SITE_OTHER): Payer: 59 | Admitting: Family Medicine

## 2020-09-24 ENCOUNTER — Encounter (INDEPENDENT_AMBULATORY_CARE_PROVIDER_SITE_OTHER): Payer: Self-pay | Admitting: Family Medicine

## 2020-09-24 VITALS — BP 159/69 | HR 82 | Temp 97.9°F | Ht 64.0 in | Wt 291.0 lb

## 2020-09-24 DIAGNOSIS — I10 Essential (primary) hypertension: Secondary | ICD-10-CM

## 2020-09-24 DIAGNOSIS — E1169 Type 2 diabetes mellitus with other specified complication: Secondary | ICD-10-CM | POA: Diagnosis not present

## 2020-09-24 DIAGNOSIS — Z9189 Other specified personal risk factors, not elsewhere classified: Secondary | ICD-10-CM | POA: Diagnosis not present

## 2020-09-24 DIAGNOSIS — Z6841 Body Mass Index (BMI) 40.0 and over, adult: Secondary | ICD-10-CM | POA: Diagnosis not present

## 2020-09-24 DIAGNOSIS — E559 Vitamin D deficiency, unspecified: Secondary | ICD-10-CM

## 2020-09-24 MED ORDER — OZEMPIC (1 MG/DOSE) 4 MG/3ML ~~LOC~~ SOPN
1.0000 mg | PEN_INJECTOR | SUBCUTANEOUS | 0 refills | Status: DC
  Filled 2020-09-24: qty 3, 28d supply, fill #0

## 2020-09-24 MED ORDER — VITAMIN D (ERGOCALCIFEROL) 1.25 MG (50000 UNIT) PO CAPS
50000.0000 [IU] | ORAL_CAPSULE | ORAL | 0 refills | Status: DC
Start: 2020-09-24 — End: 2020-10-10
  Filled 2020-09-24: qty 4, 28d supply, fill #0

## 2020-09-25 ENCOUNTER — Ambulatory Visit (INDEPENDENT_AMBULATORY_CARE_PROVIDER_SITE_OTHER): Payer: 59 | Admitting: Family Medicine

## 2020-09-25 DIAGNOSIS — E1165 Type 2 diabetes mellitus with hyperglycemia: Secondary | ICD-10-CM | POA: Diagnosis not present

## 2020-09-25 DIAGNOSIS — H35412 Lattice degeneration of retina, left eye: Secondary | ICD-10-CM | POA: Diagnosis not present

## 2020-09-25 DIAGNOSIS — H40002 Preglaucoma, unspecified, left eye: Secondary | ICD-10-CM | POA: Diagnosis not present

## 2020-09-25 DIAGNOSIS — Z83511 Family history of glaucoma: Secondary | ICD-10-CM | POA: Diagnosis not present

## 2020-09-25 NOTE — Progress Notes (Signed)
Chief Complaint:   OBESITY Erin [name] is here to discuss her progress with her obesity treatment plan along with follow-up of her obesity related diagnoses. Erin [name] is on the Category 3 Plan and keeping a food journal and adhering to recommended goals of 400-500 calories and 35+ grams of protein at lunch daily and states she is following her eating plan approximately 0% of the time. Erin [name] states she is walking for 5 minutes 1 time per week.  Today's visit was #: 84 Starting weight: 306 lbs Starting date: 03/08/2020 Today's weight: 291 lbs Today's date: 09/24/2020 Total lbs lost to date: 15 Total lbs lost since last in-office visit: 1  Interim History: Erin [name] continues to work on weight loss, but she has had increased stress and she hasn't been able to meal plan as well. She was able to portion control but her protein has likely decreased.  Subjective:   1. Type 2 diabetes mellitus with other specified complication, without long-term current use of insulin ([place]) Erin [name] is doing well with Ozempic. She notes some nausea at times especially if she misses meals.  2. Vitamin D deficiency Erin [name] is stable on Vit D, and she denies signs of over-replacement.  3. Essential hypertension Erin [name] forgot her blood pressure medications this morning, and her blood pressure is elevated today.  4. At risk for impaired metabolic function Erin [name] is at increased risk for impaired metabolic function with decreased protein.  Assessment/Plan:   1. Type 2 diabetes mellitus with other specified complication, without long-term current use of insulin ([facility]) We will refill Ozempic for 1 month, and Erin [name] will continue metformin and make sure to eat on a regular basis. Good blood sugar control is important to decrease the likelihood of diabetic complications such as nephropathy, neuropathy, limb loss, blindness, coronary artery disease, and death. Intensive lifestyle modification  including diet, exercise and weight loss are the first line of treatment for diabetes.   - Semaglutide, 1 MG/DOSE, (OZEMPIC, 1 MG/DOSE,) 4 MG/3ML SOPN; Inject 1 mg into the skin once a week.  Dispense: 3 mL; Refill: 0  2. Vitamin D deficiency Low Vitamin D level contributes to fatigue and are associated with obesity, breast, and colon cancer. We will refill prescription Vitamin D for 1 month. Erin [name] will follow-up for routine testing of Vitamin D, at least 2-3 times per year to avoid over-replacement.  - Vitamin D, Ergocalciferol, (DRISDOL) 1.25 MG (50000 UNIT) CAPS capsule; Take 1 capsule (50,000 Units total) by mouth every 7 (seven) days.  Dispense: 4 capsule; Refill: 0  3. Essential hypertension Erin [name] will continue her medications and diet, and we will recheck her blood pressure in 2-3 weeks. She will continue working on healthy weight loss and exercise to improve blood pressure control.   4. At risk for impaired metabolic function Erin [name] was given approximately 15 minutes of impaired  metabolic function prevention counseling today. We discussed intensive lifestyle modifications today with an emphasis on specific nutrition and exercise instructions and strategies.   Repetitive spaced learning was employed today to elicit superior memory formation and behavioral change.  5. Obesity with current BMI 50.1 Erin [name] is currently in the action stage of change. As such, her goal is to continue with weight loss efforts. She has agreed to the Category 3 Plan and keeping a food journal and adhering to recommended goals of 400-500 calories and 35+ grams of protein at lunch daily.   Exercise goals: As is.  Behavioral modification strategies: increasing lean protein intake and meal planning  and cooking strategies.  Erin [name] has agreed to follow-up with our clinic in 2 to 3 weeks. She was informed of the importance of frequent follow-up visits to maximize her success with intensive lifestyle  modifications for her multiple health conditions.   Objective:   Blood pressure (!) 159/69, pulse 82, temperature 97.9 F (36.6 C), height 5\' 4"  (1.626 m), weight 291 lb (132 kg), SpO2 98 %. Body mass index is 49.95 kg/m.  General: Cooperative, alert, well developed, in no acute distress. HEENT: Conjunctivae and lids unremarkable. Cardiovascular: Regular rhythm.  Lungs: Normal work of breathing. Neurologic: No focal deficits.   Lab Results  Component Value Date   CREATININE 0.93 04/04/2020   BUN 27 (H) 04/04/2020   NA 137 04/04/2020   K 3.4 (L) 04/04/2020   CL 104 04/04/2020   CO2 24 04/04/2020   Lab Results  Component Value Date   ALT 23 03/08/2020   AST 17 03/08/2020   ALKPHOS 90 03/08/2020   BILITOT 0.3 03/08/2020   Lab Results  Component Value Date   HGBA1C 6.7 (H) 04/04/2020   HGBA1C 6.5 (A) 06/21/2019   HGBA1C 5.6 08/09/2018   HGBA1C 5.5 05/12/2017   HGBA1C 5.7 05/27/2016   Lab Results  Component Value Date   INSULIN 27.9 (H) 03/08/2020   Lab Results  Component Value Date   TSH 2.320 03/08/2020   Lab Results  Component Value Date   CHOL 228 (H) 03/08/2020   HDL 52 03/08/2020   LDLCALC 164 (H) 03/08/2020   TRIG 70 03/08/2020   CHOLHDL 3.1 06/21/2019   Lab Results  Component Value Date   VD25OH 17.3 (L) 03/08/2020   Lab Results  Component Value Date   WBC 7.4 04/04/2020   HGB 12.9 04/04/2020   HCT 40.0 04/04/2020   MCV 94.8 04/04/2020   PLT 277 04/04/2020   No results found for: IRON, TIBC, FERRITIN  Attestation Statements:   Reviewed by clinician on day of visit: allergies, medications, problem list, medical history, surgical history, family history, social history, and previous encounter notes.   I, Trixie Dredge, am acting as transcriptionist for Dennard Nip, MD.  I have reviewed the above documentation for accuracy and completeness, and I agree with the above. -  Dennard Nip, MD

## 2020-09-30 ENCOUNTER — Other Ambulatory Visit (HOSPITAL_COMMUNITY): Payer: Self-pay

## 2020-09-30 DIAGNOSIS — R300 Dysuria: Secondary | ICD-10-CM | POA: Diagnosis not present

## 2020-09-30 DIAGNOSIS — R309 Painful micturition, unspecified: Secondary | ICD-10-CM | POA: Diagnosis not present

## 2020-09-30 MED ORDER — SULFAMETHOXAZOLE-TRIMETHOPRIM 800-160 MG PO TABS
1.0000 | ORAL_TABLET | Freq: Two times a day (BID) | ORAL | 0 refills | Status: DC
  Filled 2020-09-30: qty 14, 7d supply, fill #0

## 2020-10-09 ENCOUNTER — Ambulatory Visit (INDEPENDENT_AMBULATORY_CARE_PROVIDER_SITE_OTHER): Payer: 59 | Admitting: Family Medicine

## 2020-10-10 ENCOUNTER — Encounter (INDEPENDENT_AMBULATORY_CARE_PROVIDER_SITE_OTHER): Payer: Self-pay | Admitting: Family Medicine

## 2020-10-10 ENCOUNTER — Ambulatory Visit (INDEPENDENT_AMBULATORY_CARE_PROVIDER_SITE_OTHER): Payer: 59 | Admitting: Family Medicine

## 2020-10-10 ENCOUNTER — Other Ambulatory Visit: Payer: Self-pay

## 2020-10-10 ENCOUNTER — Other Ambulatory Visit (HOSPITAL_COMMUNITY): Payer: Self-pay

## 2020-10-10 VITALS — BP 163/85 | HR 91 | Temp 98.4°F | Ht 64.0 in | Wt 294.0 lb

## 2020-10-10 DIAGNOSIS — Z6841 Body Mass Index (BMI) 40.0 and over, adult: Secondary | ICD-10-CM | POA: Diagnosis not present

## 2020-10-10 DIAGNOSIS — F3289 Other specified depressive episodes: Secondary | ICD-10-CM

## 2020-10-10 DIAGNOSIS — E1169 Type 2 diabetes mellitus with other specified complication: Secondary | ICD-10-CM

## 2020-10-10 DIAGNOSIS — I10 Essential (primary) hypertension: Secondary | ICD-10-CM

## 2020-10-10 DIAGNOSIS — E559 Vitamin D deficiency, unspecified: Secondary | ICD-10-CM | POA: Diagnosis not present

## 2020-10-10 DIAGNOSIS — Z9189 Other specified personal risk factors, not elsewhere classified: Secondary | ICD-10-CM

## 2020-10-10 MED ORDER — VITAMIN D (ERGOCALCIFEROL) 1.25 MG (50000 UNIT) PO CAPS
50000.0000 [IU] | ORAL_CAPSULE | ORAL | 0 refills | Status: DC
  Filled 2020-10-10 – 2020-11-14 (×2): qty 4, 28d supply, fill #0

## 2020-10-10 MED ORDER — BUPROPION HCL ER (SR) 200 MG PO TB12
200.0000 mg | ORAL_TABLET | Freq: Every morning | ORAL | 0 refills | Status: DC
  Filled 2020-10-10: qty 30, 30d supply, fill #0

## 2020-10-10 MED ORDER — OZEMPIC (1 MG/DOSE) 4 MG/3ML ~~LOC~~ SOPN
1.0000 mg | PEN_INJECTOR | SUBCUTANEOUS | 0 refills | Status: DC
Start: 2020-10-10 — End: 2021-01-14
  Filled 2020-10-10: qty 3, 28d supply, fill #0

## 2020-10-10 NOTE — Progress Notes (Signed)
Chief Complaint:   OBESITY Erin [name] is here to discuss her progress with her obesity treatment plan along with follow-up of her obesity related diagnoses. Erin [name] is on the Category 3 Plan and keeping a food journal and adhering to recommended goals of 400-500 calories and 35+ grams of protein at lunch daily and states she is following her eating plan approximately 0% of the time. Erin [name] states she is doing 0 minutes 0 times per week.  Today's visit was #: 14 Starting weight: 306 lbs Starting date: 03/08/2020 Today's weight: 294 lbs Today's date: 10/10/2020 Total lbs lost to date: 12 Total lbs lost since last in-office visit: 0  Interim History: Erin [name] hasn't been able to stay on her eating plan for multiple reasons. She is retaining some fluid today as well. She has multiple health issues which are occupying her time.  Subjective:   1. Vitamin D deficiency Erin [name] is stable on Vit D, but her level is not yet at goal.  2. Type 2 diabetes mellitus with other specified complication, without long-term current use of insulin (Erin [name]) Erin [name] did not bring in her glucose log today. She is tolerating Ozempic, and she denies nausea or vomiting.  3. Essential hypertension Erin [name]'s blood pressure is elevated today. She took her medications late. She denies chest pain.  4. Other depression with emotional eating Erin [name] has increased stressors right now. She is tolerating Wellbutrin but her blood pressure is elevated today, not related to Wellbutrin.  5. At risk for heart disease Erin [name] is at a higher than average risk for cardiovascular disease due to obesity.   Assessment/Plan:   1. Vitamin D deficiency Low Vitamin D level contributes to fatigue and are associated with obesity, breast, and colon cancer. We will refill prescription Vitamin D for 1 month. Erin [name] will follow-up for routine testing of Vitamin D, at least 2-3 times per year to avoid  over-replacement.  - Vitamin D, Ergocalciferol, (DRISDOL) 1.25 MG (50000 UNIT) CAPS capsule; Take 1 capsule (50,000 Units total) by mouth every 7 (seven) days.  Dispense: 4 capsule; Refill: 0  2. Type 2 diabetes mellitus with other specified complication, without long-term current use of insulin (Erin [name]) Erin [name] will continue her medications, and we will refill Ozempic for 1 month. Good blood sugar control is important to decrease the likelihood of diabetic complications such as nephropathy, neuropathy, limb loss, blindness, coronary artery disease, and death. Intensive lifestyle modification including diet, exercise and weight loss are the first line of treatment for diabetes.   - Semaglutide, 1 MG/DOSE, (OZEMPIC, 1 MG/DOSE,) 4 MG/3ML SOPN; Inject 1 mg into the skin once a week.  Dispense: 3 mL; Refill: 0  3. Essential hypertension Erin [name] will continue with diet, weight loss, and her medications to improve blood pressure control. We will recheck her blood pressure in 1 month. She will watch for signs of hypotension as she continues her lifestyle modifications.  4. Other depression with emotional eating Behavior modification techniques were discussed today to help Erin [name] deal with her emotional/non-hunger eating behaviors.  We will refill Wellbutrin SR for 1 month, and we will recheck her blood pressure in 1 month. Orders and follow up as documented in patient record.   - buPROPion (WELLBUTRIN SR) 200 MG 12 hr tablet; Take 1 tablet (200 mg total) by mouth every morning.  Dispense: 30 tablet; Refill: 0  5. At risk for heart disease Erin [name] was given approximately 15 minutes of coronary artery disease prevention counseling today. She is 55 y.o. female and  has risk factors for heart disease including obesity. We discussed intensive lifestyle modifications today with an emphasis on specific weight loss instructions and strategies.   Repetitive spaced learning was employed today to elicit  superior memory formation and behavioral change.  6. Obesity with current BMI 50.5 Erin [name] is currently in the action stage of change. As such, her goal is to maintain weight for now. She has agreed to the Category 3 Plan.   Erin [name]'s goal is to maintain her weight for now while she works on her other health problems.  Behavioral modification strategies: increasing lean protein intake, increasing water intake, and increasing high fiber foods.  Erin [name] has agreed to follow-up with our clinic in 4 weeks. She was informed of the importance of frequent follow-up visits to maximize her success with intensive lifestyle modifications for her multiple health conditions.   Objective:   Blood pressure (!) 163/85, pulse 91, temperature 98.4 F (36.9 C), height 5\' 4"  (1.626 m), weight 294 lb (133.4 kg), SpO2 98 %. Body mass index is 50.46 kg/m.  General: Cooperative, alert, well developed, in no acute distress. HEENT: Conjunctivae and lids unremarkable. Cardiovascular: Regular rhythm.  Lungs: Normal work of breathing. Neurologic: No focal deficits.   Lab Results  Component Value Date   CREATININE 0.93 04/04/2020   BUN 27 (H) 04/04/2020   NA 137 04/04/2020   K 3.4 (L) 04/04/2020   CL 104 04/04/2020   CO2 24 04/04/2020   Lab Results  Component Value Date   ALT 23 03/08/2020   AST 17 03/08/2020   ALKPHOS 90 03/08/2020   BILITOT 0.3 03/08/2020   Lab Results  Component Value Date   HGBA1C 6.7 (H) 04/04/2020   HGBA1C 6.5 (A) 06/21/2019   HGBA1C 5.6 08/09/2018   HGBA1C 5.5 05/12/2017   HGBA1C 5.7 05/27/2016   Lab Results  Component Value Date   INSULIN 27.9 (H) 03/08/2020   Lab Results  Component Value Date   TSH 2.320 03/08/2020   Lab Results  Component Value Date   CHOL 228 (H) 03/08/2020   HDL 52 03/08/2020   LDLCALC 164 (H) 03/08/2020   TRIG 70 03/08/2020   CHOLHDL 3.1 06/21/2019   Lab Results  Component Value Date   VD25OH 17.3 (L) 03/08/2020   Lab Results   Component Value Date   WBC 7.4 04/04/2020   HGB 12.9 04/04/2020   HCT 40.0 04/04/2020   MCV 94.8 04/04/2020   PLT 277 04/04/2020   No results found for: IRON, TIBC, FERRITIN  Attestation Statements:   Reviewed by clinician on day of visit: allergies, medications, problem list, medical history, surgical history, family history, social history, and previous encounter notes.   I, Trixie Dredge, am acting as transcriptionist for Dennard Nip, MD.  I have reviewed the above documentation for accuracy and completeness, and I agree with the above. -  Dennard Nip, MD

## 2020-10-11 ENCOUNTER — Other Ambulatory Visit (HOSPITAL_COMMUNITY): Payer: Self-pay

## 2020-10-20 ENCOUNTER — Encounter (HOSPITAL_COMMUNITY): Payer: Self-pay

## 2020-10-20 ENCOUNTER — Emergency Department (HOSPITAL_COMMUNITY): Payer: 59

## 2020-10-20 ENCOUNTER — Emergency Department (HOSPITAL_COMMUNITY)
Admission: EM | Admit: 2020-10-20 | Discharge: 2020-10-20 | Disposition: A | Discharge status: Home / Self Care | Payer: 59 | Attending: Emergency Medicine | Admitting: Emergency Medicine

## 2020-10-20 DIAGNOSIS — R1011 Right upper quadrant pain: Secondary | ICD-10-CM | POA: Insufficient documentation

## 2020-10-20 DIAGNOSIS — I10 Essential (primary) hypertension: Secondary | ICD-10-CM | POA: Diagnosis not present

## 2020-10-20 DIAGNOSIS — R102 Pelvic and perineal pain: Secondary | ICD-10-CM | POA: Insufficient documentation

## 2020-10-20 DIAGNOSIS — R300 Dysuria: Secondary | ICD-10-CM | POA: Diagnosis present

## 2020-10-20 DIAGNOSIS — Z79899 Other long term (current) drug therapy: Secondary | ICD-10-CM | POA: Diagnosis not present

## 2020-10-20 DIAGNOSIS — R109 Unspecified abdominal pain: Secondary | ICD-10-CM | POA: Diagnosis not present

## 2020-10-20 DIAGNOSIS — R339 Retention of urine, unspecified: Secondary | ICD-10-CM

## 2020-10-20 DIAGNOSIS — Z87891 Personal history of nicotine dependence: Secondary | ICD-10-CM | POA: Insufficient documentation

## 2020-10-20 DIAGNOSIS — N9489 Other specified conditions associated with female genital organs and menstrual cycle: Secondary | ICD-10-CM | POA: Insufficient documentation

## 2020-10-20 DIAGNOSIS — Z7984 Long term (current) use of oral hypoglycemic drugs: Secondary | ICD-10-CM | POA: Insufficient documentation

## 2020-10-20 DIAGNOSIS — K219 Gastro-esophageal reflux disease without esophagitis: Secondary | ICD-10-CM | POA: Diagnosis not present

## 2020-10-20 DIAGNOSIS — E119 Type 2 diabetes mellitus without complications: Secondary | ICD-10-CM | POA: Insufficient documentation

## 2020-10-20 LAB — URINALYSIS, ROUTINE W REFLEX MICROSCOPIC
Bacteria, UA: NONE SEEN
Bilirubin Urine: NEGATIVE
Glucose, UA: 100 mg/dL — AB
Ketones, ur: NEGATIVE mg/dL
Leukocytes,Ua: NEGATIVE
Nitrite: NEGATIVE
Protein, ur: NEGATIVE mg/dL
RBC / HPF: >50 RBC/hpf — ABNORMAL HIGH (ref 0–5)
Specific Gravity, Urine: 1.01 (ref 1.005–1.030)
pH: 6 (ref 5.0–8.0)

## 2020-10-20 LAB — COMPREHENSIVE METABOLIC PANEL
ALT: 17 U/L (ref 0–44)
AST: 18 U/L (ref 15–41)
Albumin: 3.9 g/dL (ref 3.5–5.0)
Alkaline Phosphatase: 91 U/L (ref 38–126)
Anion gap: 8 (ref 5–15)
BUN: 19 mg/dL (ref 6–20)
CO2: 21 mmol/L — ABNORMAL LOW (ref 22–32)
Calcium: 10 mg/dL (ref 8.9–10.3)
Chloride: 109 mmol/L (ref 98–111)
Creatinine, Ser: 0.94 mg/dL (ref 0.44–1.00)
GFR, Estimated: >60 mL/min (ref >60)
Glucose, Bld: 144 mg/dL — ABNORMAL HIGH (ref 70–99)
Potassium: 3.6 mmol/L (ref 3.5–5.1)
Sodium: 138 mmol/L (ref 135–145)
Total Bilirubin: 0.6 mg/dL (ref 0.3–1.2)
Total Protein: 7.9 g/dL (ref 6.5–8.1)

## 2020-10-20 LAB — CBC
HCT: 41.3 % (ref 36.0–46.0)
Hemoglobin: 13.9 g/dL (ref 12.0–15.0)
MCH: 32 pg (ref 26.0–34.0)
MCHC: 33.7 g/dL (ref 30.0–36.0)
MCV: 95.2 fL (ref 80.0–100.0)
Platelets: 305 10*3/uL (ref 150–400)
RBC: 4.34 MIL/uL (ref 3.87–5.11)
RDW: 14.2 % (ref 11.5–15.5)
WBC: 12.3 10*3/uL — ABNORMAL HIGH (ref 4.0–10.5)
nRBC: 0 % (ref 0.0–0.2)

## 2020-10-20 LAB — WET PREP, GENITAL
Clue Cells Wet Prep HPF POC: NONE SEEN
Sperm: NONE SEEN
Trich, Wet Prep: NONE SEEN
Yeast Wet Prep HPF POC: NONE SEEN

## 2020-10-20 LAB — LIPASE, BLOOD: Lipase: 39 U/L (ref 11–51)

## 2020-10-20 LAB — I-STAT BETA HCG BLOOD, ED (MC, WL, AP ONLY): I-stat hCG, quantitative: 10.2 m[IU]/mL — ABNORMAL HIGH (ref <5)

## 2020-10-20 LAB — HCG, QUANTITATIVE, PREGNANCY: hCG, Beta Chain, Quant, S: 2 m[IU]/mL (ref <5)

## 2020-10-20 MED ORDER — ONDANSETRON HCL 4 MG/2ML IJ SOLN
4.0000 mg | Freq: Once | INTRAMUSCULAR | Status: AC
  Administered 2020-10-20: 4 mg via INTRAVENOUS
  Filled 2020-10-20: qty 2

## 2020-10-20 MED ORDER — MORPHINE SULFATE (PF) 4 MG/ML IV SOLN
4.0000 mg | Freq: Once | INTRAVENOUS | Status: AC
  Administered 2020-10-20: 4 mg via INTRAVENOUS
  Filled 2020-10-20: qty 1

## 2020-10-20 MED ORDER — IOHEXOL 350 MG/ML SOLN
100.0000 mL | Freq: Once | INTRAVENOUS | Status: AC | PRN
  Administered 2020-10-20: 100 mL via INTRAVENOUS

## 2020-10-20 NOTE — Discharge Instructions (Addendum)
You were seen evaluated in the emergency room today for urinary obstruction.  As we discussed, we are going to leave the Foley catheter in place and have you follow-up with urology early next week.  Please call them to schedule an appointment.  The rest of your lab work was normal and reassuring.  Please return to the emergency department for worsening symptoms, new blood in your urine, new fever, burning when you pee, or any other concerns you may have.

## 2020-10-20 NOTE — ED Notes (Signed)
Pt d/c home per MD order. Discharge summary reviewed with pt and pt visitor, verbalize understanding. D/c with leg bag per MD order. Ambulatory off unit. No s/s of acute distress noted at discharge.

## 2020-10-20 NOTE — ED Provider Notes (Addendum)
[facility] DEPT Provider Note   CSN: [id number] Arrival date & time: 10/20/20  0935     History Chief Complaint  Patient presents with   Dysuria   genital pain    Erin [name] is a 55 y.o. female with history of diabetes and hypertension who presents to the emergency department today for dysuria and decreased urine output that began earlier this morning.  She states that she has been having dysuria over the last couple weeks and was seen and evaluated by her primary care provider who diagnosed with UTI.  Symptoms did not improve.  She attempted to get in with 2 different urologist but was unable to get an appointment.  Decreased urinary output started this morning which prompted her arrival to the emergency department today.  She denies any abdominal pain, nausea, vomiting, diarrhea, fever, chills, cough, sore throat, hematuria, vaginal discharge, and vaginal bleeding.  She does complain of a burning sensation to the urethra area which she rates a 9/10 in severity.  The history is provided by the patient and the spouse. No language interpreter was used.  Dysuria     Past Medical History:  Diagnosis Date   Anxiety    Constipation    Depression    GERD (gastroesophageal reflux disease)    Pt states she no longer needs meds   Hyperlipidemia    Hypertension    Menorrhagia    OSA (obstructive sleep apnea) 11/25/2017   OSA on CPAP    Other fatigue    Shortness of breath on exertion    Type 2 diabetes mellitus ([place])    Vitamin D deficiency    Wears glasses     Patient Active Problem List   Diagnosis Date Noted   Tear of right rotator cuff 04/04/2020   Vitamin D deficiency 04/02/2020   Other fatigue 03/13/2020   Shortness of breath on exertion 03/13/2020   OSA (obstructive sleep apnea) 11/25/2017   Obesity hypoventilation syndrome ([place]) 11/25/2017   Mallet finger of right hand 09/03/2017   Anterolisthesis 04/17/2017   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[place]) 04/17/2017   Degenerative disc disease, lumbar 04/17/2017   Class 3 severe obesity with serious comorbidity and body mass index (BMI) of 50.0 to 59.9 in adult ([place]) 06/01/2016   Diabetes mellitus ([place]) 05/27/2016   Essential hypertension 04/13/2016   Menorrhagia 08/26/2015    Class: Present on Admission    Past Surgical History:  Procedure Laterality Date   BREAST EXCISIONAL BIOPSY     DILITATION & CURRETTAGE/HYSTROSCOPY WITH NOVASURE ABLATION N/A 08/26/2015   Procedure: DILATATION & CURETTAGE/HYSTEROSCOPY WITH NOVASURE ABLATION;  Surgeon: [name], MD;  Location: [facility];  Service: Gynecology;  Laterality: N/A;   EXCISIONAL LEFT BREAST BX  11-14-2007   benign   LAPAROSCOPIC TUBAL LIGATION Bilateral 08/26/2015   Procedure: LAPAROSCOPIC TUBAL LIGATION;  Surgeon: [name], MD;  Location: [facility];  Service: Gynecology;  Laterality: Bilateral;   SHOULDER ARTHROSCOPY WITH ROTATOR CUFF REPAIR AND SUBACROMIAL DECOMPRESSION Right 04/08/2020   Procedure: SHOULDER ARTHROSCOPY WITH ROTATOR CUFF REPAIR AND SUBACROMIAL DECOMPRESSION;  Surgeon: Tania Ade, MD;  Location: [facility];  Service: Orthopedics;  Laterality: Right;  NEED 90 MINUTES FOR THIS CASE   THYROID LOBECTOMY Right 04-30-2008     OB History     Gravida  0   Para  0   Term  0   Preterm  0   AB  0   Living  0  SAB  0   IAB  0   Ectopic  0   Multiple  0   Live Births  0           Family History  Problem Relation Age of Onset   Stroke Mother    Diabetes Mother    Hypertension Mother    Obesity Mother    Glaucoma Father    Alcoholism Father    Heart disease Paternal Grandmother        enlarged heart   Diabetes Half-Sister    Hypertension Half-Sister    Colon cancer Neg Hx    Esophageal cancer Neg Hx    Stomach cancer Neg Hx    Colon polyps Neg Hx    Rectal cancer Neg Hx     Social History   Tobacco Use   Smoking status: Former     Years: 15.00    Types: Cigarettes    Quit date: 2001    Years since quitting: 21.7   Smokeless tobacco: Never  Vaping Use   Vaping Use: Never used  Substance Use Topics   Alcohol use: No   Drug use: No    Home Medications Prior to Admission medications   Medication Sig Start Date End Date Taking? Authorizing Provider  amLODipine (NORVASC) 5 MG tablet TAKE 1 TABLET (5 MG TOTAL) BY MOUTH DAILY. 03/06/20 03/06/21  Fay Records, MD  buPROPion (WELLBUTRIN SR) 200 MG 12 hr tablet Take 1 tablet (200 mg total) by mouth every morning. 10/10/20   Dennard Nip D, MD  carvedilol (COREG) 6.25 MG tablet TAKE 1 TABLET BY MOUTH 2 TIMES DAILY WITH A MEAL. Patient taking differently: Take 6.25 mg by mouth daily. 03/06/20 03/06/21  Fay Records, MD  glycopyrrolate (ROBINUL) 1 MG tablet Take 1 tablet by mouth daily. 06/16/17   [provider]  hydrochlorothiazide (HYDRODIURIL) 25 MG tablet TAKE 1 TABLET (25 MG TOTAL) BY MOUTH DAILY. 03/06/20 03/06/21  Fay Records, MD  metFORMIN (GLUCOPHAGE) 500 MG tablet Take 1 tablet (500 mg total) by mouth daily. 07/10/20     olmesartan (BENICAR) 40 MG tablet TAKE 1 TABLET (40 MG TOTAL) BY MOUTH DAILY. 03/06/20 03/06/21  Fay Records, MD  rosuvastatin (CRESTOR) 10 MG tablet TAKE 1 TABLET (10 MG TOTAL) BY MOUTH DAILY. 03/06/20 03/06/21  Fay Records, MD  Semaglutide, 1 MG/DOSE, (OZEMPIC, 1 MG/DOSE,) 4 MG/3ML SOPN Inject 1 mg into the skin once a week. 10/10/20   Dennard Nip D, MD  Vitamin D, Ergocalciferol, (DRISDOL) 1.25 MG (50000 UNIT) CAPS capsule Take 1 capsule (50,000 Units total) by mouth every 7 (seven) days. 10/10/20   Starlyn Skeans, MD    Allergies    Patient has no known allergies.  Review of Systems   Review of Systems  Genitourinary:  Positive for dysuria.  All other systems reviewed and are negative.  Physical Exam Updated Vital Signs BP (!) 167/93   Pulse 79   Temp 98.4 F (36.9 C) (Oral)   Resp 18   Ht 5\' 4"  (1.626 m)   Wt 133.8 kg    SpO2 96%   BMI 50.64 kg/m   Physical Exam Exam conducted with a chaperone present.  Constitutional:      General: She is not in acute distress.    Appearance: Normal appearance.  HENT:     Head: Normocephalic and atraumatic.  Eyes:     General:        Right eye: No discharge.  Left eye: No discharge.  Cardiovascular:     Comments: Regular rate and rhythm.  S1/S2 are distinct without any evidence of murmur, rubs, or gallops.  Radial pulses are 2+ bilaterally.  Dorsalis pedis pulses are 2+ bilaterally.  No evidence of pedal edema. Pulmonary:     Comments: Clear to auscultation bilaterally.  Normal effort.  No respiratory distress.  No evidence of wheezes, rales, or rhonchi heard throughout. Abdominal:     General: Abdomen is flat. Bowel sounds are normal. There is no distension.     Tenderness: There is abdominal tenderness in the right upper quadrant and right lower quadrant. There is no guarding or rebound.  Genitourinary:    Comments: External anatomy was normal.  No evidence of lesions or rashes.  Vaginal canal was normal without any evidence of lesion.  It was difficult to visualize the cervix secondary to body habitus.  Bimanual exam revealed no adnexal tenderness. Musculoskeletal:        General: Normal range of motion.     Cervical back: Neck supple.  Skin:    General: Skin is warm and dry.     Findings: No rash.  Neurological:     General: No focal deficit present.     Mental Status: She is alert.  Psychiatric:        Mood and Affect: Mood normal.        Behavior: Behavior normal.    ED Results / Procedures / Treatments   Labs (all labs ordered are listed, but only abnormal results are displayed) Labs Reviewed  WET PREP, GENITAL - Abnormal; Notable for the following components:      Result Value   WBC, Wet Prep HPF POC RARE (*)    All other components within normal limits  COMPREHENSIVE METABOLIC PANEL - Abnormal; Notable for the following components:   CO2  21 (*)    Glucose, Bld 144 (*)    All other components within normal limits  CBC - Abnormal; Notable for the following components:   WBC 12.3 (*)    All other components within normal limits  URINALYSIS, ROUTINE W REFLEX MICROSCOPIC - Abnormal; Notable for the following components:   Color, Urine YELLOW (*)    APPearance CLEAR (*)    Glucose, UA 100 (*)    Hgb urine dipstick LARGE (*)    RBC / HPF >50 (*)    All other components within normal limits  I-STAT BETA HCG BLOOD, ED ([facility], [facility], [facility] ONLY) - Abnormal; Notable for the following components:   I-stat hCG, quantitative 10.2 (*)    All other components within normal limits  URINE CULTURE  LIPASE, BLOOD  HCG, QUANTITATIVE, PREGNANCY  GC/CHLAMYDIA PROBE AMP ([place]) NOT AT [facility]    EKG None  Radiology CT ABDOMEN PELVIS W CONTRAST  Result Date: 10/20/2020 CLINICAL DATA:  Abdominal pain. EXAM: CT ABDOMEN AND PELVIS WITH CONTRAST TECHNIQUE: Multidetector CT imaging of the abdomen and pelvis was performed using the standard protocol following bolus administration of intravenous contrast. CONTRAST:  15mL OMNIPAQUE IOHEXOL 350 MG/ML SOLN COMPARISON:  June 02, 2016 FINDINGS: Lower chest: No acute abnormality. Hepatobiliary: No focal liver abnormality is seen. No gallstones, gallbladder wall thickening, or biliary dilatation. Pancreas: Unremarkable. No pancreatic ductal dilatation or surrounding inflammatory changes. Spleen: Normal in size without focal abnormality. Adrenals/Urinary Tract: 2.8 x 2.4 cm left adrenal mass. Given this finding is stable since 2018, it likely represents a benign adrenal adenoma. Normal right adrenal. Normal kidneys, ureters and urinary  bladder. The urinary bladder is not hyper expanded. Stomach/Bowel: Stomach is within normal limits. Appendix appears normal. No evidence of bowel wall thickening, distention, or inflammatory changes. Vascular/Lymphatic: Aortic atherosclerosis. No enlarged abdominal or pelvic lymph  nodes. Reproductive: The uterus is small. Bilateral ovaries have a normal appearance. There is a 2.4 cm tubular area of hypoattenuation in the region of the cervix and urethra. Other: Small fat containing anterior abdominal wall hernias are seen in the supra- and infraumbilical regions. No abdominopelvic ascites. Musculoskeletal: No acute or significant osseous findings. IMPRESSION: 1. No evidence of acute abnormalities within the solid abdominal organs. 2. 2.4 cm tubular area of hypoattenuation in the region of the cervix and urethra. This may represent a nabothian cyst, or distension of the urethra. Further evaluation with pelvic ultrasound may be considered. 3. Small fat containing anterior abdominal wall hernias in the supra- and infraumbilical regions. 4. Aortic atherosclerosis. Aortic Atherosclerosis (ICD10-I70.0). Electronically Signed   By: Fidela Salisbury M.D.   On: 10/20/2020 12:41    Procedures Procedures   Medications Ordered in ED Medications  morphine 4 MG/ML injection 4 mg (4 mg Intravenous Given 10/20/20 1205)  ondansetron (ZOFRAN) injection 4 mg (4 mg Intravenous Given 10/20/20 1205)  iohexol (OMNIPAQUE) 350 MG/ML injection 100 mL (100 mLs Intravenous Contrast Given 10/20/20 1140)    ED Course  I have reviewed the triage vital signs and the nursing notes.  Pertinent labs & imaging results that were available during my care of the patient were reviewed by me and considered in my medical decision making (see chart for details).  Clinical Course as of 10/20/20 1749  Erin [name] Oct 20, 2020  1039 Discussed plan of care with attending.  He agrees. [CF]  1418 I-stat hCG, quantitative(!): 10.2 Noted to be positive.  Serum quant was negative.  [CF]  1620 Spoke with Dr. Milford Cage with Urology. He recommends leaving the foley catheter in place for 3-4 days and have her follow up in the office later in the week for a void trial  [CF]    Clinical Course User Index [CF] Cherrie Gauze    MDM Rules/Calculators/A&P                          AARTHI UYENO is a 55 y.o. female who presents the emergency department today for decreased urine output.  She has been in retention since earlier this morning.  Considered other etiologies but given history, exam and work-up I have a low suspicion for cauda equina, infectious etiology such as pyelonephritis or cystitis, constipation, intra-abdominal mass, trauma, nephrolithiasis, urolithiasis, or drug reaction.  CT was negative for appendicitis, diverticulitis, kidney stone, or any other acute abdominal pathology at this time.  There was evidence of a hypodense area at the level of the cervix/urethra.  Given that she has clinically improved and feels much better with the Foley catheter in place I will have her follow-up with urology early next week.  She is hemodynamically stable, and safe for discharge.  Strict return precautions given.  Again I will have her follow-up with urology for further evaluation.    Final Clinical Impression(s) / ED Diagnoses Final diagnoses:  Urinary retention    Rx / DC Orders ED Discharge Orders     None        Hendricks Limes, PA-C 10/20/20 1601    Myna Bright [place], PA-C 10/20/20 1626    [address] [place], [place] 10/20/20 1749  Myna Bright [place], PA-C 10/20/20 1754    Regan Lemming, MD 10/20/20 1919

## 2020-10-20 NOTE — ED Triage Notes (Signed)
Pt presents to ED c/o dysuria starting 3-4 weeks ago and genital pain starting last night. Reports having to force urine out, frequency. No hx UTI. Denies exposure to STI. Two weeks ago neg for UTI. Taking ibuprofen with no relief.

## 2020-10-21 LAB — GC/CHLAMYDIA PROBE AMP (~~LOC~~) NOT AT ARMC
Chlamydia: NEGATIVE
Comment: NEGATIVE
Comment: NORMAL
Neisseria Gonorrhea: NEGATIVE

## 2020-10-22 LAB — URINE CULTURE: Culture: NO GROWTH

## 2020-10-24 ENCOUNTER — Ambulatory Visit: Payer: 59 | Admitting: Internal Medicine

## 2020-10-24 ENCOUNTER — Ambulatory Visit (INDEPENDENT_AMBULATORY_CARE_PROVIDER_SITE_OTHER): Payer: 59 | Admitting: Family Medicine

## 2020-10-24 ENCOUNTER — Encounter (INDEPENDENT_AMBULATORY_CARE_PROVIDER_SITE_OTHER): Payer: Self-pay | Admitting: Family Medicine

## 2020-10-24 ENCOUNTER — Other Ambulatory Visit: Payer: Self-pay

## 2020-10-24 VITALS — BP 147/80 | HR 94 | Temp 98.2°F | Ht 64.0 in

## 2020-10-24 DIAGNOSIS — I10 Essential (primary) hypertension: Secondary | ICD-10-CM | POA: Diagnosis not present

## 2020-10-24 DIAGNOSIS — Z6841 Body Mass Index (BMI) 40.0 and over, adult: Secondary | ICD-10-CM | POA: Diagnosis not present

## 2020-10-24 DIAGNOSIS — R7303 Prediabetes: Secondary | ICD-10-CM

## 2020-10-27 NOTE — Progress Notes (Signed)
Chief Complaint:   OBESITY Erin [name] is here to discuss her progress with her obesity treatment plan along with follow-up of her obesity related diagnoses. Erin [name] is on the Category 3 Plan and states she is following her eating plan approximately 0% of the time. Erin [name] states she is doing 0 minutes 0 times per week.  Today's visit was #: 15 Starting weight: 306 lbs Starting date: 03/08/2020 Today's weight: 292 lbs Today's date: 10/24/2020 Total lbs lost to date: 14 Total lbs lost since last in-office visit: 2  Interim History: Erin [name] continues to do well with weight loss. She hasn't been able to follow her plan as closely due to financial issues, but she has been mindful and has tried to portion control and make smarter choices.  Subjective:   1. Pre-diabetes Erin [name] is stable on metformin and Ozempic, and she denies nausea or vomiting.  2. Essential hypertension Erin [name]'s blood pressure is elevated today. She was seen in the emergency department recently for urinary retention and she still has her catheter in place and is waiting on her urology appointment. She is otherwise taking her medications regularly.  Assessment/Plan:   1. Pre-diabetes Erin [name] will continue Ozempic and metformin as is, and will continue with diet and exercise to help decrease the risk of diabetes.   2. Essential hypertension Erin [name] will continue with diet and exercise to improve blood pressure control. We will recheck her blood pressure in 3 weeks as she continues her lifestyle modifications.  3. Obesity with current BMI 50.3 Erin [name] is currently in the action stage of change. As such, her goal is to continue with weight loss efforts. She has agreed to the Category 3 Plan.   Shredded chicken recipes was given today. We discussed ways to lower her food bill.  Behavioral modification strategies: increasing lean protein intake.  Erin [name] has agreed to follow-up with our clinic in  3 weeks. She was informed of the importance of frequent follow-up visits to maximize her success with intensive lifestyle modifications for her multiple health conditions.   Objective:   Blood pressure (!) 147/80, pulse 94, temperature 98.2 F (36.8 C), height 5\' 4"  (1.626 m), SpO2 97 %. Body mass index is 50.64 kg/m.  General: Cooperative, alert, well developed, in no acute distress. HEENT: Conjunctivae and lids unremarkable. Cardiovascular: Regular rhythm.  Lungs: Normal work of breathing. Neurologic: No focal deficits.   Lab Results  Component Value Date   CREATININE 0.94 10/20/2020   BUN 19 10/20/2020   NA 138 10/20/2020   K 3.6 10/20/2020   CL 109 10/20/2020   CO2 21 (L) 10/20/2020   Lab Results  Component Value Date   ALT 17 10/20/2020   AST 18 10/20/2020   ALKPHOS 91 10/20/2020   BILITOT 0.6 10/20/2020   Lab Results  Component Value Date   HGBA1C 6.7 (H) 04/04/2020   HGBA1C 6.5 (A) 06/21/2019   HGBA1C 5.6 08/09/2018   HGBA1C 5.5 05/12/2017   HGBA1C 5.7 05/27/2016   Lab Results  Component Value Date   INSULIN 27.9 (H) 03/08/2020   Lab Results  Component Value Date   TSH 2.320 03/08/2020   Lab Results  Component Value Date   CHOL 228 (H) 03/08/2020   HDL 52 03/08/2020   LDLCALC 164 (H) 03/08/2020   TRIG 70 03/08/2020   CHOLHDL 3.1 06/21/2019   Lab Results  Component Value Date   VD25OH 17.3 (L) 03/08/2020   Lab Results  Component Value Date   WBC 12.3 (H) 10/20/2020  HGB 13.9 10/20/2020   HCT 41.3 10/20/2020   MCV 95.2 10/20/2020   PLT 305 10/20/2020   No results found for: IRON, TIBC, FERRITIN  Attestation Statements:   Reviewed by clinician on day of visit: allergies, medications, problem list, medical history, surgical history, family history, social history, and previous encounter notes.  Time spent on visit including pre-visit chart review and post-visit care and charting was 43 minutes.    I, Trixie Dredge, am acting as  transcriptionist for Dennard Nip, MD.  I have reviewed the above documentation for accuracy and completeness, and I agree with the above. -  Dennard Nip, MD

## 2020-10-28 ENCOUNTER — Ambulatory Visit (INDEPENDENT_AMBULATORY_CARE_PROVIDER_SITE_OTHER): Payer: 59 | Admitting: Family Medicine

## 2020-11-06 ENCOUNTER — Other Ambulatory Visit: Payer: Self-pay | Admitting: Adult Health

## 2020-11-06 ENCOUNTER — Ambulatory Visit
Admission: RE | Admit: 2020-11-06 | Discharge: 2020-11-06 | Disposition: A | Discharge status: Home / Self Care | Payer: 59 | Source: Ambulatory Visit | Attending: Adult Health | Admitting: Adult Health

## 2020-11-06 DIAGNOSIS — N83201 Unspecified ovarian cyst, right side: Secondary | ICD-10-CM | POA: Diagnosis not present

## 2020-11-06 DIAGNOSIS — R338 Other retention of urine: Secondary | ICD-10-CM | POA: Diagnosis not present

## 2020-11-06 DIAGNOSIS — N888 Other specified noninflammatory disorders of cervix uteri: Secondary | ICD-10-CM

## 2020-11-11 ENCOUNTER — Encounter (INDEPENDENT_AMBULATORY_CARE_PROVIDER_SITE_OTHER): Payer: Self-pay

## 2020-11-11 ENCOUNTER — Ambulatory Visit (INDEPENDENT_AMBULATORY_CARE_PROVIDER_SITE_OTHER): Payer: 59 | Admitting: Family Medicine

## 2020-11-12 DIAGNOSIS — L74510 Primary focal hyperhidrosis, axilla: Secondary | ICD-10-CM | POA: Diagnosis not present

## 2020-11-14 ENCOUNTER — Other Ambulatory Visit (HOSPITAL_COMMUNITY): Payer: Self-pay

## 2020-11-14 MED FILL — Rosuvastatin Calcium Tab 10 MG: ORAL | 90 days supply | Qty: 90 | Fill #1 | Status: AC

## 2020-11-14 MED FILL — Carvedilol Tab 6.25 MG: ORAL | 90 days supply | Qty: 180 | Fill #0 | Status: AC

## 2020-11-14 MED FILL — Hydrochlorothiazide Tab 25 MG: ORAL | 90 days supply | Qty: 90 | Fill #1 | Status: AC

## 2020-11-14 MED FILL — Olmesartan Medoxomil Tab 40 MG: ORAL | 90 days supply | Qty: 90 | Fill #1 | Status: AC

## 2020-11-14 MED FILL — Amlodipine Besylate Tab 5 MG (Base Equivalent): ORAL | 90 days supply | Qty: 90 | Fill #1 | Status: AC

## 2020-11-18 ENCOUNTER — Other Ambulatory Visit (HOSPITAL_COMMUNITY): Payer: Self-pay

## 2020-11-18 MED ORDER — DEXAMETHASONE 1 MG PO TABS
1.0000 mg | ORAL_TABLET | Freq: Every day | ORAL | 0 refills | Status: DC
  Filled 2020-11-18: qty 1, 1d supply, fill #0

## 2020-11-18 MED ORDER — CIPROFLOXACIN HCL 500 MG PO TABS
500.0000 mg | ORAL_TABLET | Freq: Two times a day (BID) | ORAL | 0 refills | Status: DC
  Filled 2020-11-18: qty 14, 7d supply, fill #0

## 2020-11-19 ENCOUNTER — Encounter: Payer: Self-pay | Admitting: Gastroenterology

## 2020-11-19 ENCOUNTER — Other Ambulatory Visit (HOSPITAL_COMMUNITY): Payer: Self-pay

## 2020-11-19 MED ORDER — CEPHALEXIN 500 MG PO CAPS
500.0000 mg | ORAL_CAPSULE | Freq: Two times a day (BID) | ORAL | 0 refills | Status: DC
  Filled 2020-11-19: qty 14, 7d supply, fill #0

## 2020-11-20 ENCOUNTER — Other Ambulatory Visit (HOSPITAL_COMMUNITY): Payer: Self-pay

## 2020-11-27 ENCOUNTER — Encounter (HOSPITAL_COMMUNITY): Payer: Self-pay | Admitting: Oncology

## 2020-11-27 ENCOUNTER — Other Ambulatory Visit: Payer: Self-pay

## 2020-11-27 ENCOUNTER — Emergency Department (HOSPITAL_COMMUNITY)
Admission: EM | Admit: 2020-11-27 | Discharge: 2020-11-27 | Disposition: A | Discharge status: Home / Self Care | Payer: 59 | Attending: Emergency Medicine | Admitting: Emergency Medicine

## 2020-11-27 DIAGNOSIS — Z7984 Long term (current) use of oral hypoglycemic drugs: Secondary | ICD-10-CM | POA: Insufficient documentation

## 2020-11-27 DIAGNOSIS — Z79899 Other long term (current) drug therapy: Secondary | ICD-10-CM | POA: Insufficient documentation

## 2020-11-27 DIAGNOSIS — I10 Essential (primary) hypertension: Secondary | ICD-10-CM | POA: Insufficient documentation

## 2020-11-27 DIAGNOSIS — E119 Type 2 diabetes mellitus without complications: Secondary | ICD-10-CM | POA: Insufficient documentation

## 2020-11-27 DIAGNOSIS — Z794 Long term (current) use of insulin: Secondary | ICD-10-CM | POA: Insufficient documentation

## 2020-11-27 DIAGNOSIS — D72829 Elevated white blood cell count, unspecified: Secondary | ICD-10-CM | POA: Insufficient documentation

## 2020-11-27 DIAGNOSIS — Z87891 Personal history of nicotine dependence: Secondary | ICD-10-CM | POA: Insufficient documentation

## 2020-11-27 DIAGNOSIS — N390 Urinary tract infection, site not specified: Secondary | ICD-10-CM | POA: Insufficient documentation

## 2020-11-27 LAB — CBC WITH DIFFERENTIAL/PLATELET
Abs Immature Granulocytes: 0.15 10*3/uL — ABNORMAL HIGH (ref 0.00–0.07)
Basophils Absolute: 0.1 10*3/uL (ref 0.0–0.1)
Basophils Relative: 1 %
Eosinophils Absolute: 0.1 10*3/uL (ref 0.0–0.5)
Eosinophils Relative: 1 %
HCT: 38 % (ref 36.0–46.0)
Hemoglobin: 12.5 g/dL (ref 12.0–15.0)
Immature Granulocytes: 1 %
Lymphocytes Relative: 11 %
Lymphs Abs: 1.4 10*3/uL (ref 0.7–4.0)
MCH: 30.9 pg (ref 26.0–34.0)
MCHC: 32.9 g/dL (ref 30.0–36.0)
MCV: 94.1 fL (ref 80.0–100.0)
Monocytes Absolute: 1 10*3/uL (ref 0.1–1.0)
Monocytes Relative: 8 %
Neutro Abs: 10.2 10*3/uL — ABNORMAL HIGH (ref 1.7–7.7)
Neutrophils Relative %: 78 %
Platelets: 310 10*3/uL (ref 150–400)
RBC: 4.04 MIL/uL (ref 3.87–5.11)
RDW: 13.7 % (ref 11.5–15.5)
WBC: 12.9 10*3/uL — ABNORMAL HIGH (ref 4.0–10.5)
nRBC: 0 % (ref 0.0–0.2)

## 2020-11-27 LAB — URINALYSIS, ROUTINE W REFLEX MICROSCOPIC
Bacteria, UA: NONE SEEN
Bilirubin Urine: NEGATIVE
Glucose, UA: NEGATIVE mg/dL
Ketones, ur: NEGATIVE mg/dL
Nitrite: NEGATIVE
Protein, ur: 30 mg/dL — AB
Specific Gravity, Urine: 1.019 (ref 1.005–1.030)
pH: 7 (ref 5.0–8.0)

## 2020-11-27 LAB — COMPREHENSIVE METABOLIC PANEL
ALT: 20 U/L (ref 0–44)
AST: 23 U/L (ref 15–41)
Albumin: 3.4 g/dL — ABNORMAL LOW (ref 3.5–5.0)
Alkaline Phosphatase: 187 U/L — ABNORMAL HIGH (ref 38–126)
Anion gap: 8 (ref 5–15)
BUN: 17 mg/dL (ref 6–20)
CO2: 27 mmol/L (ref 22–32)
Calcium: 10.1 mg/dL (ref 8.9–10.3)
Chloride: 103 mmol/L (ref 98–111)
Creatinine, Ser: 0.91 mg/dL (ref 0.44–1.00)
GFR, Estimated: >60 mL/min (ref >60)
Glucose, Bld: 119 mg/dL — ABNORMAL HIGH (ref 70–99)
Potassium: 3.1 mmol/L — ABNORMAL LOW (ref 3.5–5.1)
Sodium: 138 mmol/L (ref 135–145)
Total Bilirubin: 0.5 mg/dL (ref 0.3–1.2)
Total Protein: 7.5 g/dL (ref 6.5–8.1)

## 2020-11-27 MED ORDER — KETOROLAC TROMETHAMINE 15 MG/ML IJ SOLN
15.0000 mg | Freq: Once | INTRAMUSCULAR | Status: AC
  Administered 2020-11-27: 15 mg via INTRAVENOUS
  Filled 2020-11-27: qty 1

## 2020-11-27 MED ORDER — LEVOFLOXACIN 750 MG PO TABS
750.0000 mg | ORAL_TABLET | Freq: Once | ORAL | Status: AC
  Administered 2020-11-27: 750 mg via ORAL
  Filled 2020-11-27: qty 1

## 2020-11-27 MED ORDER — PHENAZOPYRIDINE HCL 200 MG PO TABS
200.0000 mg | ORAL_TABLET | Freq: Three times a day (TID) | ORAL | 0 refills | Status: DC | PRN
  Filled 2020-11-28: qty 9, 3d supply, fill #0

## 2020-11-27 MED ORDER — CYCLOBENZAPRINE HCL 10 MG PO TABS
5.0000 mg | ORAL_TABLET | Freq: Once | ORAL | Status: AC
  Administered 2020-11-27: 5 mg via ORAL
  Filled 2020-11-27: qty 1

## 2020-11-27 MED ORDER — PHENAZOPYRIDINE HCL 100 MG PO TABS: 100.0000 mg | ORAL_TABLET | Freq: Three times a day (TID) | ORAL | Status: DC

## 2020-11-27 MED ORDER — LEVOFLOXACIN 750 MG PO TABS
750.0000 mg | ORAL_TABLET | Freq: Every day | ORAL | 0 refills | Status: AC
  Filled 2020-11-28: qty 5, 5d supply, fill #0

## 2020-11-27 MED ORDER — HYDROMORPHONE HCL 1 MG/ML IJ SOLN
1.0000 mg | Freq: Once | INTRAMUSCULAR | Status: AC
  Administered 2020-11-27: 1 mg via INTRAMUSCULAR
  Filled 2020-11-27: qty 1

## 2020-11-27 NOTE — ED Provider Notes (Signed)
Emergency Medicine Provider Triage Evaluation Note  Erin [name] , a 55 y.o. female  was evaluated in triage.  Pt complains of urinary issues for the past month.  Patient was initially treated here for urinary retention and sent to outpatient urology follow-up.  At that time they removed her Foley catheter and had diagnosed her with urinary tract infection.  She has finished her course of antibiotics.  She states that she still having burning with urination, and feels as though she has to push to empty her bladder.  She is also having left lower quadrant abdominal pain.  Review of Systems  Positive: Dysuria, urinary retention, abdominal pain Negative: Fevers, chills, nausea, vomiting, diarrhea  Physical Exam  BP (!) 163/92 (BP Location: Left Arm)   Pulse 81   Temp 98.6 F (37 C) (Oral)   Resp 16   Ht 5\' 4"  (1.626 m)   Wt 131.5 kg   SpO2 94%   BMI 49.78 kg/m  Gen:   Awake, no distress   Resp:  Normal effort  MSK:   Moves extremities without difficulty  Other:  Left lower quadrant tenderness without rebound or guarding  Medical Decision Making  Medically screening exam initiated at 5:20 PM.  Appropriate orders placed.  Erin [name] was informed that the remainder of the evaluation will be completed by another provider, this initial triage assessment does not replace that evaluation, and the importance of remaining in the ED until their evaluation is complete.     Estill Cotta 11/27/20 1722    Isla Pence, MD 11/27/20 1750

## 2020-11-27 NOTE — ED Triage Notes (Signed)
Pt presents d/t urinary issues that began last month.  Pt treated here for urinary retention and sent to [facility] for follow up.  Pt completed course of antibiotics for UTI yesterday.  Pt reports having burning w/ urination, not feeling as if she empties her bladder as now feeling as if she can not urinate.

## 2020-11-27 NOTE — Discharge Instructions (Signed)
Take antibiotics as prescribed.  Take entire course, even if symptoms improve. Use Pyridium to help with pain and burning when you pee. Continue using Tylenol or ibuprofen as needed for pain. Make sure you stay well hydrated with water. Follow-up with the urologist for further evaluation. Return to the emergency room if you develop any new, worsening, concerning symptoms.

## 2020-11-27 NOTE — ED Provider Notes (Signed)
[facility] DEPT Provider Note   CSN: [id number] Arrival date & time: 11/27/20  1631     History Chief Complaint  Patient presents with   Urinary Retention    Erin [name] is a 55 y.o. female presenting for urinary symptoms.  Patient states that the past 2 weeks she has had dysuria, foul-smelling urine, urgency, and frequency.  She also reports suprapubic and left lower quadrant pain.  She also has back pain and bilateral leg pain.  Patient states a month ago she had urinary retention, Foley was placed.  2 weeks later when the Foley was removed, patient developed urinary symptoms.  She was on a course of Keflex which he finished yesterday, reports no improvement of symptoms.  She denies fevers, chills, chest pain, shortness of breath, nausea, vomiting.  She has constipation, but this is her baseline, no change in this.  She has not taken anything today for her pain. Nothing makes it better.   Additional history obtained in chart review.  Patient with a history of anxiety, constipation, depression, GERD, hypertension, upper lipidemia, OSA, diabetes.  HPI     Past Medical History:  Diagnosis Date   Anxiety    Constipation    Depression    GERD (gastroesophageal reflux disease)    Pt states she no longer needs meds   Hyperlipidemia    Hypertension    Menorrhagia    OSA (obstructive sleep apnea) 11/25/2017   OSA on CPAP    Other fatigue    Shortness of breath on exertion    Type 2 diabetes mellitus ([place])    Vitamin D deficiency    Wears glasses     Patient Active Problem List   Diagnosis Date Noted   Tear of right rotator cuff 04/04/2020   Vitamin D deficiency 04/02/2020   Other fatigue 03/13/2020   Shortness of breath on exertion 03/13/2020   OSA (obstructive sleep apnea) 11/25/2017   Obesity hypoventilation syndrome ([place]) 11/25/2017   Mallet finger of right hand 09/03/2017   Anterolisthesis 04/17/2017   Adrenal incidentaloma  ([place]) 04/17/2017   Degenerative disc disease, lumbar 04/17/2017   Class 3 severe obesity with serious comorbidity and body mass index (BMI) of 50.0 to 59.9 in adult ([place]) 06/01/2016   Diabetes mellitus ([place]) 05/27/2016   Essential hypertension 04/13/2016   Menorrhagia 08/26/2015    Class: Present on Admission    Past Surgical History:  Procedure Laterality Date   BREAST EXCISIONAL BIOPSY     DILITATION & CURRETTAGE/HYSTROSCOPY WITH NOVASURE ABLATION N/A 08/26/2015   Procedure: DILATATION & CURETTAGE/HYSTEROSCOPY WITH NOVASURE ABLATION;  Surgeon: [name], MD;  Location: [facility];  Service: Gynecology;  Laterality: N/A;   EXCISIONAL LEFT BREAST BX  11-14-2007   benign   LAPAROSCOPIC TUBAL LIGATION Bilateral 08/26/2015   Procedure: LAPAROSCOPIC TUBAL LIGATION;  Surgeon: [name], MD;  Location: [facility];  Service: Gynecology;  Laterality: Bilateral;   SHOULDER ARTHROSCOPY WITH ROTATOR CUFF REPAIR AND SUBACROMIAL DECOMPRESSION Right 04/08/2020   Procedure: SHOULDER ARTHROSCOPY WITH ROTATOR CUFF REPAIR AND SUBACROMIAL DECOMPRESSION;  Surgeon: Tania Ade, MD;  [facility];  Service: Orthopedics;  Laterality: Right;  NEED 90 MINUTES FOR THIS CASE   THYROID LOBECTOMY Right 04-30-2008     OB History     Gravida  0   Para  0   Term  0   Preterm  0   AB  0   Living  0  SAB  0   IAB  0   Ectopic  0   Multiple  0   Live Births  0           Family History  Problem Relation Age of Onset   Stroke Mother    Diabetes Mother    Hypertension Mother    Obesity Mother    Glaucoma Father    Alcoholism Father    Heart disease Paternal Grandmother        enlarged heart   Diabetes Half-Sister    Hypertension Half-Sister    Colon cancer Neg Hx    Esophageal cancer Neg Hx    Stomach cancer Neg Hx    Colon polyps Neg Hx    Rectal cancer Neg Hx     Social History   Tobacco Use   Smoking status: Former    Years:  15.00    Types: Cigarettes    Quit date: 2001    Years since quitting: 21.8   Smokeless tobacco: Never  Vaping Use   Vaping Use: Never used  Substance Use Topics   Alcohol use: No   Drug use: No    Home Medications Prior to Admission medications   Medication Sig Start Date End Date Taking? Authorizing Provider  levofloxacin (LEVAQUIN) 750 MG tablet Take 1 tablet (750 mg total) by mouth daily for 5 days. 11/27/20 12/02/20 Yes Margeret Stachnik, PA-C  phenazopyridine (PYRIDIUM) 200 MG tablet Take 1 tablet (200 mg total) by mouth 3 (three) times daily as needed for pain. 11/27/20  Yes Keng Jewel, PA-C  amLODipine (NORVASC) 5 MG tablet TAKE 1 TABLET (5 MG TOTAL) BY MOUTH DAILY. 03/06/20 03/06/21  Fay Records, MD  buPROPion (WELLBUTRIN SR) 200 MG 12 hr tablet Take 1 tablet (200 mg total) by mouth every morning. 10/10/20   Dennard Nip D, MD  carvedilol (COREG) 6.25 MG tablet TAKE 1 TABLET BY MOUTH 2 TIMES DAILY WITH A MEAL. Patient taking differently: Take 6.25 mg by mouth daily. 03/06/20 03/06/21  Fay Records, MD  cephALEXin (KEFLEX) 500 MG capsule Take 1 capsule (500 mg total) by mouth 2 (two) times daily. 11/19/20     ciprofloxacin (CIPRO) 500 MG tablet Take 1 tablet (500 mg total) by mouth 2 (two) times daily. 11/18/20     dexamethasone (DECADRON) 1 MG tablet Take 1 tablet (1 mg total) by mouth at bedtime at 11PM prior to 8AM cortisol lab draw 11/18/20     glycopyrrolate (ROBINUL) 1 MG tablet Take 1 tablet by mouth daily. 06/16/17   [provider]  hydrochlorothiazide (HYDRODIURIL) 25 MG tablet TAKE 1 TABLET (25 MG TOTAL) BY MOUTH DAILY. 03/06/20 03/06/21  Fay Records, MD  metFORMIN (GLUCOPHAGE) 500 MG tablet Take 1 tablet (500 mg total) by mouth daily. 07/10/20     olmesartan (BENICAR) 40 MG tablet TAKE 1 TABLET (40 MG TOTAL) BY MOUTH DAILY. 03/06/20 03/06/21  Fay Records, MD  rosuvastatin (CRESTOR) 10 MG tablet TAKE 1 TABLET (10 MG TOTAL) BY MOUTH DAILY. 03/06/20 03/06/21   Fay Records, MD  Semaglutide, 1 MG/DOSE, (OZEMPIC, 1 MG/DOSE,) 4 MG/3ML SOPN Inject 1 mg into the skin once a week. 10/10/20   Dennard Nip D, MD  Vitamin D, Ergocalciferol, (DRISDOL) 1.25 MG (50000 UNIT) CAPS capsule Take 1 capsule (50,000 Units total) by mouth every 7 (seven) days. 10/10/20   Starlyn Skeans, MD    Allergies    Patient has no known allergies.  Review of Systems  Review of Systems  Genitourinary:  Positive for dysuria, frequency and pelvic pain.  Musculoskeletal:  Positive for back pain (low back bilaterally).  All other systems reviewed and are negative.  Physical Exam Updated Vital Signs BP (!) 174/94   Pulse 80   Temp 98.6 F (37 C) (Oral)   Resp 18   Ht 5\' 4"  (1.626 m)   Wt 131.5 kg   SpO2 97%   BMI 49.78 kg/m   Physical Exam Vitals and nursing note reviewed.  Constitutional:      General: She is not in acute distress.    Appearance: Normal appearance.  HENT:     Head: Normocephalic and atraumatic.  Eyes:     Conjunctiva/sclera: Conjunctivae normal.     Pupils: Pupils are equal, round, and reactive to light.  Cardiovascular:     Rate and Rhythm: Normal rate and regular rhythm.     Pulses: Normal pulses.  Pulmonary:     Effort: Pulmonary effort is normal. No respiratory distress.     Breath sounds: Normal breath sounds. No wheezing.     Comments: Speaking in full sentences.  Clear lung sounds in all fields. Abdominal:     General: There is no distension.     Palpations: Abdomen is soft. There is no mass.     Tenderness: There is abdominal tenderness. There is no guarding or rebound.     Comments: Suprapubic ttp. Mild LLQ tenderness  Musculoskeletal:        General: Normal range of motion.     Cervical back: Normal range of motion and neck supple.  Skin:    General: Skin is warm and dry.     Capillary Refill: Capillary refill takes less than 2 seconds.  Neurological:     Mental Status: She is alert and oriented to person, place, and time.   Psychiatric:        Mood and Affect: Mood and affect normal.        Speech: Speech normal.        Behavior: Behavior normal.    ED Results / Procedures / Treatments   Labs (all labs ordered are listed, but only abnormal results are displayed) Labs Reviewed  URINALYSIS, ROUTINE W REFLEX MICROSCOPIC - Abnormal; Notable for the following components:      Result Value   APPearance HAZY (*)    Hgb urine dipstick MODERATE (*)    Protein, ur 30 (*)    Leukocytes,Ua MODERATE (*)    All other components within normal limits  CBC WITH DIFFERENTIAL/PLATELET - Abnormal; Notable for the following components:   WBC 12.9 (*)    Neutro Abs 10.2 (*)    Abs Immature Granulocytes 0.15 (*)    All other components within normal limits  COMPREHENSIVE METABOLIC PANEL - Abnormal; Notable for the following components:   Potassium 3.1 (*)    Glucose, Bld 119 (*)    Albumin 3.4 (*)    Alkaline Phosphatase 187 (*)    All other components within normal limits  URINE CULTURE    EKG None  Radiology No results found.  Procedures Procedures   Medications Ordered in ED Medications  HYDROmorphone (DILAUDID) injection 1 mg (has no administration in time range)  cyclobenzaprine (FLEXERIL) tablet 5 mg (has no administration in time range)  levofloxacin (LEVAQUIN) tablet 750 mg (has no administration in time range)  phenazopyridine (PYRIDIUM) tablet 100 mg (has no administration in time range)  ketorolac (TORADOL) 15 MG/ML injection 15 mg (15 mg Intravenous  Given 11/27/20 1940)    ED Course  I have reviewed the triage vital signs and the nursing notes.  Pertinent labs & imaging results that were available during my care of the patient were reviewed by me and considered in my medical decision making (see chart for details).   MDM Rules/Calculators/A&P                           Patient presented for evaluation of urinary symptoms, suprapubic pain, low back pain.  On exam, patient appears  nontoxic.  She does have suprapubic tenderness and mild left lower quadrant tenderness.  In the setting of urinary symptoms, most likely ascending UTI.  However she is afebrile and without nausea or vomiting, doubt [place].  However considering that she was recently treated for UTI, will obtain labs to ensure no acute abnormality.  Will recheck urine.  Labs overall reassuring.  Mild leukocytosis, but similar to previous.  Urine has moderate leuks and 21-50 white cells.  As such, will treat for UTI in the setting of symptoms.  Culture sent.  Consider OB/GYN cause, however as patient had a pelvic exam a month ago and a reassuring ultrasound recently, do not believe repeat would be beneficial.  As she had a CT a month ago without acute or concerning findings, do not believe she needs repeat.  Discussed findings with patient.  Discussed importance of close follow-up with urology.  Discussed changing antibiotics and symptomatic management with Pyridium. At this time, pt appears safe for d/c. Return precautions given. Pt states she understands and agrees to plan.   Final Clinical Impression(s) / ED Diagnoses Final diagnoses:  Urinary tract infection without hematuria, site unspecified    Rx / DC Orders ED Discharge Orders          Ordered    levofloxacin (LEVAQUIN) 750 MG tablet  Daily        11/27/20 2230    phenazopyridine (PYRIDIUM) 200 MG tablet  3 times daily PRN        11/27/20 2230             Willian Donson, PA-C 11/27/20 2234    Sherwood Gambler, MD 11/27/20 2246

## 2020-11-27 NOTE — ED Notes (Signed)
An After Visit Summary was printed and given to the patient. Discharge instructions given and no further questions at this time.  

## 2020-11-28 ENCOUNTER — Other Ambulatory Visit (HOSPITAL_COMMUNITY): Payer: Self-pay

## 2020-11-29 LAB — URINE CULTURE: Culture: 20000 — AB

## 2020-12-01 ENCOUNTER — Emergency Department (HOSPITAL_BASED_OUTPATIENT_CLINIC_OR_DEPARTMENT_OTHER): Payer: Self-pay

## 2020-12-01 ENCOUNTER — Emergency Department (HOSPITAL_BASED_OUTPATIENT_CLINIC_OR_DEPARTMENT_OTHER)
Admission: EM | Admit: 2020-12-01 | Discharge: 2020-12-01 | Disposition: A | Discharge status: Home / Self Care | Payer: Self-pay | Attending: Emergency Medicine | Admitting: Emergency Medicine

## 2020-12-01 ENCOUNTER — Telehealth (HOSPITAL_BASED_OUTPATIENT_CLINIC_OR_DEPARTMENT_OTHER): Payer: Self-pay | Admitting: Emergency Medicine

## 2020-12-01 ENCOUNTER — Other Ambulatory Visit: Payer: Self-pay

## 2020-12-01 ENCOUNTER — Encounter (HOSPITAL_BASED_OUTPATIENT_CLINIC_OR_DEPARTMENT_OTHER): Payer: Self-pay | Admitting: *Deleted

## 2020-12-01 DIAGNOSIS — R1032 Left lower quadrant pain: Secondary | ICD-10-CM | POA: Insufficient documentation

## 2020-12-01 DIAGNOSIS — R1900 Intra-abdominal and pelvic swelling, mass and lump, unspecified site: Secondary | ICD-10-CM | POA: Insufficient documentation

## 2020-12-01 DIAGNOSIS — I10 Essential (primary) hypertension: Secondary | ICD-10-CM | POA: Insufficient documentation

## 2020-12-01 DIAGNOSIS — F17210 Nicotine dependence, cigarettes, uncomplicated: Secondary | ICD-10-CM | POA: Insufficient documentation

## 2020-12-01 DIAGNOSIS — A599 Trichomoniasis, unspecified: Secondary | ICD-10-CM

## 2020-12-01 DIAGNOSIS — Z79899 Other long term (current) drug therapy: Secondary | ICD-10-CM | POA: Insufficient documentation

## 2020-12-01 DIAGNOSIS — Z7984 Long term (current) use of oral hypoglycemic drugs: Secondary | ICD-10-CM | POA: Insufficient documentation

## 2020-12-01 DIAGNOSIS — E119 Type 2 diabetes mellitus without complications: Secondary | ICD-10-CM | POA: Insufficient documentation

## 2020-12-01 LAB — CBC WITH DIFFERENTIAL/PLATELET
Abs Immature Granulocytes: 0.21 10*3/uL — ABNORMAL HIGH (ref 0.00–0.07)
Basophils Absolute: 0.1 10*3/uL (ref 0.0–0.1)
Basophils Relative: 0 %
Eosinophils Absolute: 0 10*3/uL (ref 0.0–0.5)
Eosinophils Relative: 0 %
HCT: 35.8 % — ABNORMAL LOW (ref 36.0–46.0)
Hemoglobin: 12 g/dL (ref 12.0–15.0)
Immature Granulocytes: 2 %
Lymphocytes Relative: 9 %
Lymphs Abs: 1.3 10*3/uL (ref 0.7–4.0)
MCH: 30.9 pg (ref 26.0–34.0)
MCHC: 33.5 g/dL (ref 30.0–36.0)
MCV: 92.3 fL (ref 80.0–100.0)
Monocytes Absolute: 1.2 10*3/uL — ABNORMAL HIGH (ref 0.1–1.0)
Monocytes Relative: 8 %
Neutro Abs: 11.3 10*3/uL — ABNORMAL HIGH (ref 1.7–7.7)
Neutrophils Relative %: 81 %
Platelets: 295 10*3/uL (ref 150–400)
RBC: 3.88 MIL/uL (ref 3.87–5.11)
RDW: 13.6 % (ref 11.5–15.5)
WBC: 14.1 10*3/uL — ABNORMAL HIGH (ref 4.0–10.5)
nRBC: 0 % (ref 0.0–0.2)

## 2020-12-01 LAB — COMPREHENSIVE METABOLIC PANEL
ALT: 21 U/L (ref 0–44)
AST: 26 U/L (ref 15–41)
Albumin: 3 g/dL — ABNORMAL LOW (ref 3.5–5.0)
Alkaline Phosphatase: 279 U/L — ABNORMAL HIGH (ref 38–126)
Anion gap: 11 (ref 5–15)
BUN: 16 mg/dL (ref 6–20)
CO2: 26 mmol/L (ref 22–32)
Calcium: 10 mg/dL (ref 8.9–10.3)
Chloride: 97 mmol/L — ABNORMAL LOW (ref 98–111)
Creatinine, Ser: 0.78 mg/dL (ref 0.44–1.00)
GFR, Estimated: >60 mL/min (ref >60)
Glucose, Bld: 166 mg/dL — ABNORMAL HIGH (ref 70–99)
Potassium: 2.9 mmol/L — ABNORMAL LOW (ref 3.5–5.1)
Sodium: 134 mmol/L — ABNORMAL LOW (ref 135–145)
Total Bilirubin: 0.8 mg/dL (ref 0.3–1.2)
Total Protein: 7.2 g/dL (ref 6.5–8.1)

## 2020-12-01 LAB — URINALYSIS, ROUTINE W REFLEX MICROSCOPIC
Bilirubin Urine: NEGATIVE
Glucose, UA: NEGATIVE mg/dL
Ketones, ur: NEGATIVE mg/dL
Nitrite: NEGATIVE
Protein, ur: 30 mg/dL — AB
Specific Gravity, Urine: 1.015 (ref 1.005–1.030)
pH: 7 (ref 5.0–8.0)

## 2020-12-01 LAB — URINALYSIS, MICROSCOPIC (REFLEX)

## 2020-12-01 MED ORDER — ONDANSETRON HCL 4 MG/2ML IJ SOLN
4.0000 mg | Freq: Once | INTRAMUSCULAR | Status: AC
  Administered 2020-12-01: 4 mg via INTRAVENOUS
  Filled 2020-12-01: qty 2

## 2020-12-01 MED ORDER — METRONIDAZOLE 500 MG PO TABS: 500.0000 mg | ORAL_TABLET | Freq: Two times a day (BID) | ORAL | 0 refills | Status: DC

## 2020-12-01 MED ORDER — MORPHINE SULFATE (PF) 4 MG/ML IV SOLN
4.0000 mg | Freq: Once | INTRAVENOUS | Status: AC
  Administered 2020-12-01: 4 mg via INTRAVENOUS
  Filled 2020-12-01: qty 1

## 2020-12-01 MED ORDER — KETOROLAC TROMETHAMINE 30 MG/ML IJ SOLN
30.0000 mg | Freq: Once | INTRAMUSCULAR | Status: AC
  Administered 2020-12-01: 30 mg via INTRAVENOUS
  Filled 2020-12-01: qty 1

## 2020-12-01 MED ORDER — HYDROCODONE-ACETAMINOPHEN 5-325 MG PO TABS
1.0000 | ORAL_TABLET | ORAL | 0 refills | Status: DC | PRN
Start: 2020-12-01 — End: 2020-12-05

## 2020-12-01 MED ORDER — SODIUM CHLORIDE 0.9 % IV BOLUS
1000.0000 mL | Freq: Once | INTRAVENOUS | Status: AC
  Administered 2020-12-01: 1000 mL via INTRAVENOUS

## 2020-12-01 NOTE — ED Provider Notes (Signed)
[facility] EMERGENCY DEPARTMENT Provider Note   CSN: [id number] Arrival date & time: 12/01/20  0042     History Chief Complaint  Patient presents with   Leg Pain    Erin [name] is a 55 y.o. female.  Patient is a 55 year old female with past medical history of hypertension, hyperlipidemia, GERD, obesity.  She presents today with complaints of pain in her left lower quadrant/left groin that radiates down her left leg.  This has worsened over the past week.  Patient originally started with urinary retention 1 month ago.  She required Foley catheter placement which was indwelling for 2 weeks.  She subsequently developed urinary and vaginal infection which was treated with Keflex.  She was seen 3 days ago at the [facility], ER and antibiotics switched to Levaquin.  She comes back in tonight stating that she is not feeling any better and that she is having severe pain in her left groin and lower abdomen.  She denies any bowel complaints.  The history is provided by the patient.      Past Medical History:  Diagnosis Date   Anxiety    Constipation    Depression    GERD (gastroesophageal reflux disease)    Pt states she no longer needs meds   Hyperlipidemia    Hypertension    Menorrhagia    OSA (obstructive sleep apnea) 11/25/2017   OSA on CPAP    Other fatigue    Shortness of breath on exertion    Type 2 diabetes mellitus ([place])    Vitamin D deficiency    Wears glasses     Patient Active Problem [name]   Diagnosis Date Noted   Tear of right rotator cuff 04/04/2020   Vitamin D deficiency 04/02/2020   Other fatigue 03/13/2020   Shortness of breath on exertion 03/13/2020   OSA (obstructive sleep apnea) 11/25/2017   Obesity hypoventilation syndrome ([place]) 11/25/2017   Mallet finger of right hand 09/03/2017   Anterolisthesis 04/17/2017   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[place]) 04/17/2017   Degenerative disc disease, lumbar 04/17/2017   Class 3 severe obesity with serious  comorbidity and body mass index (BMI) of 50.0 to 59.9 in adult ([place]) 06/01/2016   Diabetes mellitus ([place]) 05/27/2016   Essential hypertension 04/13/2016   Menorrhagia 08/26/2015    Class: Present on Admission    Past Surgical History:  Procedure Laterality Date   BREAST EXCISIONAL BIOPSY     DILITATION & CURRETTAGE/HYSTROSCOPY WITH NOVASURE ABLATION N/A 08/26/2015   Procedure: DILATATION & CURETTAGE/HYSTEROSCOPY WITH NOVASURE ABLATION;  Surgeon: [name], MD;  Location: [facility];  Service: Gynecology;  Laterality: N/A;   EXCISIONAL LEFT BREAST BX  11-14-2007   benign   LAPAROSCOPIC TUBAL LIGATION Bilateral 08/26/2015   Procedure: LAPAROSCOPIC TUBAL LIGATION;  Surgeon: [name], MD;  Location: [facility];  Service: Gynecology;  Laterality: Bilateral;   SHOULDER ARTHROSCOPY WITH ROTATOR CUFF REPAIR AND SUBACROMIAL DECOMPRESSION Right 04/08/2020   Procedure: SHOULDER ARTHROSCOPY WITH ROTATOR CUFF REPAIR AND SUBACROMIAL DECOMPRESSION;  Surgeon: Tania Ade, MD;  Location: [facility];  Service: Orthopedics;  Laterality: Right;  NEED 90 MINUTES FOR THIS CASE   THYROID LOBECTOMY Right 04-30-2008     OB History     Gravida  0   Para  0   Term  0   Preterm  0   AB  0   Living  0      SAB  0   IAB  0  Ectopic  0   Multiple  0   Live Births  0           Family History  Problem Relation Age of Onset   Stroke Mother    Diabetes Mother    Hypertension Mother    Obesity Mother    Glaucoma Father    Alcoholism Father    Heart disease Paternal Grandmother        enlarged heart   Diabetes Half-Sister    Hypertension Half-Sister    Colon cancer Neg Hx    Esophageal cancer Neg Hx    Stomach cancer Neg Hx    Colon polyps Neg Hx    Rectal cancer Neg Hx     Social History   Tobacco Use   Smoking status: Some Days    Years: 15.00    Types: Cigarettes    Last attempt to quit: 2001    Years since quitting: 21.8   Smokeless  tobacco: Never  Vaping Use   Vaping Use: Never used  Substance Use Topics   Alcohol use: No   Drug use: No    Home Medications Prior to Admission medications   Medication Sig Start Date End Date Taking? Authorizing Provider  amLODipine (NORVASC) 5 MG tablet TAKE 1 TABLET (5 MG TOTAL) BY MOUTH DAILY. 03/06/20 03/06/21  Fay Records, MD  buPROPion (WELLBUTRIN SR) 200 MG 12 hr tablet Take 1 tablet (200 mg total) by mouth every morning. 10/10/20   Dennard Nip D, MD  carvedilol (COREG) 6.25 MG tablet TAKE 1 TABLET BY MOUTH 2 TIMES DAILY WITH A MEAL. Patient taking differently: Take 6.25 mg by mouth daily. 03/06/20 03/06/21  Fay Records, MD  cephALEXin (KEFLEX) 500 MG capsule Take 1 capsule (500 mg total) by mouth 2 (two) times daily. 11/19/20     ciprofloxacin (CIPRO) 500 MG tablet Take 1 tablet (500 mg total) by mouth 2 (two) times daily. 11/18/20     dexamethasone (DECADRON) 1 MG tablet Take 1 tablet (1 mg total) by mouth at bedtime at 11PM prior to 8AM cortisol lab draw 11/18/20     glycopyrrolate (ROBINUL) 1 MG tablet Take 1 tablet by mouth daily. 06/16/17   [provider]  hydrochlorothiazide (HYDRODIURIL) 25 MG tablet TAKE 1 TABLET (25 MG TOTAL) BY MOUTH DAILY. 03/06/20 03/06/21  Fay Records, MD  levofloxacin (LEVAQUIN) 750 MG tablet Take 1 tablet (750 mg total) by mouth daily for 5 days. 11/27/20 12/03/20  Caccavale, Sophia, PA-C  metFORMIN (GLUCOPHAGE) 500 MG tablet Take 1 tablet (500 mg total) by mouth daily. 07/10/20     olmesartan (BENICAR) 40 MG tablet TAKE 1 TABLET (40 MG TOTAL) BY MOUTH DAILY. 03/06/20 03/06/21  Fay Records, MD  phenazopyridine (PYRIDIUM) 200 MG tablet Take 1 tablet (200 mg total) by mouth 3 (three) times daily as needed for pain. 11/27/20   Caccavale, Sophia, PA-C  rosuvastatin (CRESTOR) 10 MG tablet TAKE 1 TABLET (10 MG TOTAL) BY MOUTH DAILY. 03/06/20 03/06/21  Fay Records, MD  Semaglutide, 1 MG/DOSE, (OZEMPIC, 1 MG/DOSE,) 4 MG/3ML SOPN Inject 1 mg into the  skin once a week. 10/10/20   Dennard Nip D, MD  Vitamin D, Ergocalciferol, (DRISDOL) 1.25 MG (50000 UNIT) CAPS capsule Take 1 capsule (50,000 Units total) by mouth every 7 (seven) days. 10/10/20   Starlyn Skeans, MD    Allergies    Patient has no known allergies.  Review of Systems   Review of Systems  All other  systems reviewed and are negative.  Physical Exam Updated Vital Signs BP (!) 149/73   Pulse 98   Temp 98.6 F (37 C) (Oral)   Resp 20   Ht 5\' 4"  (1.626 m)   Wt 133.4 kg   SpO2 94%   BMI 50.46 kg/m   Physical Exam Vitals and nursing note reviewed.  Constitutional:      General: She is not in acute distress.    Appearance: She is well-developed. She is not diaphoretic.  HENT:     Head: Normocephalic and atraumatic.  Cardiovascular:     Rate and Rhythm: Normal rate and regular rhythm.     Heart sounds: No murmur heard.   No friction rub. No gallop.  Pulmonary:     Effort: Pulmonary effort is normal. No respiratory distress.     Breath sounds: Normal breath sounds. No wheezing.  Abdominal:     General: Bowel sounds are normal. There is no distension.     Palpations: Abdomen is soft.     Tenderness: There is abdominal tenderness. There is no left CVA tenderness, guarding or rebound.     Comments: There is tenderness to palpation in the left groin and left lower quadrant.  There is no palpable abnormality.  Musculoskeletal:        General: Normal range of motion.     Cervical back: Normal range of motion and neck supple.  Skin:    General: Skin is warm and dry.  Neurological:     General: No focal deficit present.     Mental Status: She is alert and oriented to person, place, and time.    ED Results / Procedures / Treatments   Labs (all labs ordered are listed, but only abnormal results are displayed) Labs Reviewed  COMPREHENSIVE METABOLIC PANEL  CBC WITH DIFFERENTIAL/PLATELET  URINALYSIS, ROUTINE W REFLEX MICROSCOPIC    EKG None  Radiology No  results found.  Procedures Procedures   Medications Ordered in ED Medications  sodium chloride 0.9 % bolus 1,000 mL (has no administration in time range)  morphine 4 MG/ML injection 4 mg (has no administration in time range)  ketorolac (TORADOL) 30 MG/ML injection 30 mg (has no administration in time range)  ondansetron (ZOFRAN) injection 4 mg (has no administration in time range)    ED Course  I have reviewed the triage vital signs and the nursing notes.  Pertinent labs & imaging results that were available during my care of the patient were reviewed by me and considered in my medical decision making (see chart for details).    MDM Rules/Calculators/A&P  Patient presenting here with complaints of left lower quadrant pain.  She has been having issues with urinary retention and had a Foley catheter placed along with a pelvic infection all in the past month.  Due to the degree of patient's discomfort, a CT scan was ordered.  This shows a new mass in the left lower quadrant of undetermined etiology.  This could possibly represent sequelae of a torsed ovary versus hemorrhagic cyst versus tubo-ovarian abscess.  Ultrasound has been recommended to further delineate.  Care will be signed out to Dr. Tamera Punt at shift change.  She will obtain the results of the ultrasound and determine the final disposition.  Final Clinical Impression(s) / ED Diagnoses Final diagnoses:  None    Rx / DC Orders ED Discharge Orders     None        Veryl Speak, MD 12/03/20 [id number]

## 2020-12-01 NOTE — ED Triage Notes (Signed)
Pt reports started on cipro ~ 1 week ago for UTI. She couldn't tolerate it so was switched to Keflex. She was seen at [facility] on Wednesday and was switched to [place] and given AZO. States she is having severe leg and back pain and difficulty walking. Not feeling any better. Took tylenol earlier tonight. Brought in by EMS

## 2020-12-01 NOTE — ED Notes (Signed)
Purewick placed on pt. Pt had a large accident in bed and was cleaned up. Pt aware of UA

## 2020-12-01 NOTE — ED Notes (Signed)
Pt placed on 2L O2. Wears CPAP at home for sleep apnea

## 2020-12-01 NOTE — ED Provider Notes (Signed)
Care was taken over from Dr. Stark Jock.  Patient presented with some lower abdominal pain.  She has had a difficult course over the last 4 to 6 weeks.  She initially had some urinary retention and had a Foley catheter placed.  She had associated urinary tract infection at that time and was antibiotics.  She grew out E. coli.  She has had ongoing urinary symptoms and has been treated with Keflex and now on Levaquin.  She has had some worsening pain to her lower abdomen.  She also has had some associated low back pain with some radiation down her upper thighs.  She denies any numbness or weakness in her extremities.  She denies any numbness around her genital area.  She denies any loss of sensation that she needs to urinate.  She has had some urinary urgency.  She was noted today to have a large 9 and half centimeter mass in her pelvic area.  Pelvic ultrasound was done which shows a cystic structure.  It was noted that this could be resulting from a hemorrhagic cyst versus sequelae of a torsed ovary.  [facility]  I spoke with OB/GYN there who said that they will see the patient.  However patient has subsequently lost her insurance and will be self-pay.  Patient does not feel that she would have money to pay anything upfront.  I then spoke with [facility] with the [facility] on-call GYN.  She said that they will try to arrange some expeditious [facility].  She does have some ongoing pain but her abdominal exam is nonconcerning and nonsurgical.  The gynecologist who I discussed case with do not feel that she needs to have emergent intervention.  I also spoke with Dr. Alinda Money with urology who reviewed the scans and does not feel that there is any compression of the ureter that would be leading to her ongoing urinary symptoms.  Her urinalysis does show some WBCs but also has trichomonas.  Her white count is elevated but she is afebrile.  In discussion  with urology, her urine was sent for culture but will not change her antibiotic at this point.  We will treat her trichomonas with Flagyl.  She does not have any suggestions clinically of cauda equina.  She does not have any numbness or weakness in her legs.  There is no neurologic deficits currently.  She does not have any loss of sensation in her genital area.  We will discharge her home with close GYN follow-up.  Strict return precautions were given.   Malvin Johns, MD 12/01/20 1159

## 2020-12-01 NOTE — Discharge Instructions (Signed)
Call tomorrow to make an appointment with the [facility].  You should be seen within the next few days.  Return to the emergency department as needed if you have any worsening symptoms.

## 2020-12-01 NOTE — Progress Notes (Signed)
Patient was on [facility] while sleeping due to hx of CPAP. Taken off O2 for trip to ultrasound. SAT 98%.

## 2020-12-01 NOTE — Telephone Encounter (Signed)
Post ED Visit - Positive Culture Follow-up  Culture report reviewed by antimicrobial stewardship pharmacist: [place] Team []  Elenor Quinones, Pharm.D. []  Heide Guile, Pharm.D., BCPS AQ-ID []  Parks Neptune, Pharm.D., BCPS []  Alycia Rossetti, Pharm.D., BCPS []  Dixon, Pharm.D., BCPS, AAHIVP []  Legrand Como, Pharm.D., BCPS, AAHIVP []  Salome Arnt, PharmD, BCPS []  Johnnette Gourd, PharmD, BCPS []  Hughes Better, PharmD, BCPS []  Leeroy Cha, PharmD []  Laqueta Linden, PharmD, BCPS []  Albertina Parr, PharmD  [place] Team []  Leodis Sias, PharmD []  Lindell Spar, PharmD []  Royetta Asal, PharmD [x]  Graylin Shiver, Rph []  Rema Fendt) Glennon Mac, PharmD []  Arlyn Dunning, PharmD []  Netta Cedars, PharmD []  [name], PharmD []  Leone Haven, PharmD []  Gretta Arab, PharmD []  Theodis Shove, PharmD []  Peggyann Juba, PharmD []  Reuel Boom, PharmD   Positive urine culture Treated with Levaquin, organism sensitive to the same and no further patient follow-up is required at this time.  Sandi Raveling Iran Rowe 12/01/2020, 10:51 AM

## 2020-12-02 ENCOUNTER — Ambulatory Visit (INDEPENDENT_AMBULATORY_CARE_PROVIDER_SITE_OTHER): Payer: 59 | Admitting: Family Medicine

## 2020-12-02 LAB — URINE CULTURE: Culture: <10000 — AB

## 2020-12-03 ENCOUNTER — Other Ambulatory Visit: Payer: Self-pay | Admitting: Urology

## 2020-12-03 DIAGNOSIS — N888 Other specified noninflammatory disorders of cervix uteri: Secondary | ICD-10-CM

## 2020-12-05 ENCOUNTER — Encounter (HOSPITAL_COMMUNITY): Payer: Self-pay

## 2020-12-05 ENCOUNTER — Emergency Department (HOSPITAL_COMMUNITY): Payer: Self-pay

## 2020-12-05 ENCOUNTER — Other Ambulatory Visit: Payer: Self-pay

## 2020-12-05 ENCOUNTER — Emergency Department (HOSPITAL_COMMUNITY)
Admission: EM | Admit: 2020-12-05 | Discharge: 2020-12-05 | Disposition: A | Discharge status: Home / Self Care | Payer: Self-pay | Attending: Emergency Medicine | Admitting: Emergency Medicine

## 2020-12-05 DIAGNOSIS — R1032 Left lower quadrant pain: Secondary | ICD-10-CM

## 2020-12-05 DIAGNOSIS — N838 Other noninflammatory disorders of ovary, fallopian tube and broad ligament: Secondary | ICD-10-CM

## 2020-12-05 DIAGNOSIS — Z79899 Other long term (current) drug therapy: Secondary | ICD-10-CM | POA: Insufficient documentation

## 2020-12-05 DIAGNOSIS — N839 Noninflammatory disorder of ovary, fallopian tube and broad ligament, unspecified: Secondary | ICD-10-CM | POA: Insufficient documentation

## 2020-12-05 DIAGNOSIS — E119 Type 2 diabetes mellitus without complications: Secondary | ICD-10-CM | POA: Insufficient documentation

## 2020-12-05 DIAGNOSIS — Z794 Long term (current) use of insulin: Secondary | ICD-10-CM | POA: Insufficient documentation

## 2020-12-05 DIAGNOSIS — I10 Essential (primary) hypertension: Secondary | ICD-10-CM | POA: Insufficient documentation

## 2020-12-05 DIAGNOSIS — F17210 Nicotine dependence, cigarettes, uncomplicated: Secondary | ICD-10-CM | POA: Insufficient documentation

## 2020-12-05 DIAGNOSIS — K219 Gastro-esophageal reflux disease without esophagitis: Secondary | ICD-10-CM | POA: Insufficient documentation

## 2020-12-05 DIAGNOSIS — R079 Chest pain, unspecified: Secondary | ICD-10-CM | POA: Insufficient documentation

## 2020-12-05 DIAGNOSIS — Z7984 Long term (current) use of oral hypoglycemic drugs: Secondary | ICD-10-CM | POA: Insufficient documentation

## 2020-12-05 LAB — COMPREHENSIVE METABOLIC PANEL
ALT: 58 U/L — ABNORMAL HIGH (ref 0–44)
AST: 73 U/L — ABNORMAL HIGH (ref 15–41)
Albumin: 2.7 g/dL — ABNORMAL LOW (ref 3.5–5.0)
Alkaline Phosphatase: 344 U/L — ABNORMAL HIGH (ref 38–126)
Anion gap: 11 (ref 5–15)
BUN: 18 mg/dL (ref 6–20)
CO2: 26 mmol/L (ref 22–32)
Calcium: 9.7 mg/dL (ref 8.9–10.3)
Chloride: 96 mmol/L — ABNORMAL LOW (ref 98–111)
Creatinine, Ser: 0.82 mg/dL (ref 0.44–1.00)
GFR, Estimated: >60 mL/min (ref >60)
Glucose, Bld: 165 mg/dL — ABNORMAL HIGH (ref 70–99)
Potassium: 2.8 mmol/L — ABNORMAL LOW (ref 3.5–5.1)
Sodium: 133 mmol/L — ABNORMAL LOW (ref 135–145)
Total Bilirubin: 0.6 mg/dL (ref 0.3–1.2)
Total Protein: 6.8 g/dL (ref 6.5–8.1)

## 2020-12-05 LAB — CBC WITH DIFFERENTIAL/PLATELET
Abs Immature Granulocytes: 0.35 10*3/uL — ABNORMAL HIGH (ref 0.00–0.07)
Basophils Absolute: 0.1 10*3/uL (ref 0.0–0.1)
Basophils Relative: 0 %
Eosinophils Absolute: 0 10*3/uL (ref 0.0–0.5)
Eosinophils Relative: 0 %
HCT: 38 % (ref 36.0–46.0)
Hemoglobin: 12.4 g/dL (ref 12.0–15.0)
Immature Granulocytes: 2 %
Lymphocytes Relative: 8 %
Lymphs Abs: 1.2 10*3/uL (ref 0.7–4.0)
MCH: 30.2 pg (ref 26.0–34.0)
MCHC: 32.6 g/dL (ref 30.0–36.0)
MCV: 92.7 fL (ref 80.0–100.0)
Monocytes Absolute: 1.1 10*3/uL — ABNORMAL HIGH (ref 0.1–1.0)
Monocytes Relative: 7 %
Neutro Abs: 12.1 10*3/uL — ABNORMAL HIGH (ref 1.7–7.7)
Neutrophils Relative %: 83 %
Platelets: 291 10*3/uL (ref 150–400)
RBC: 4.1 MIL/uL (ref 3.87–5.11)
RDW: 13.5 % (ref 11.5–15.5)
WBC: 14.8 10*3/uL — ABNORMAL HIGH (ref 4.0–10.5)
nRBC: 0.1 % (ref 0.0–0.2)

## 2020-12-05 LAB — URINALYSIS, ROUTINE W REFLEX MICROSCOPIC
Bilirubin Urine: NEGATIVE
Glucose, UA: NEGATIVE mg/dL
Ketones, ur: 5 mg/dL — AB
Nitrite: NEGATIVE
Protein, ur: 30 mg/dL — AB
Specific Gravity, Urine: 1.026 (ref 1.005–1.030)
pH: 5 (ref 5.0–8.0)

## 2020-12-05 LAB — LIPASE, BLOOD: Lipase: 22 U/L (ref 11–51)

## 2020-12-05 MED ORDER — MORPHINE SULFATE (PF) 4 MG/ML IV SOLN
4.0000 mg | Freq: Once | INTRAVENOUS | Status: AC
  Administered 2020-12-05: 4 mg via INTRAVENOUS
  Filled 2020-12-05: qty 1

## 2020-12-05 MED ORDER — OXYCODONE-ACETAMINOPHEN 5-325 MG PO TABS
1.0000 | ORAL_TABLET | Freq: Four times a day (QID) | ORAL | 0 refills | Status: DC | PRN
Start: 2020-12-05 — End: 2021-01-14

## 2020-12-05 MED ORDER — GADOBUTROL 1 MMOL/ML IV SOLN
10.0000 mL | Freq: Once | INTRAVENOUS | Status: AC | PRN
  Administered 2020-12-05: 10 mL via INTRAVENOUS

## 2020-12-05 MED ORDER — HYDROCODONE-ACETAMINOPHEN 5-325 MG PO TABS
1.0000 | ORAL_TABLET | ORAL | 0 refills | Status: DC | PRN
Start: 2020-12-05 — End: 2020-12-05

## 2020-12-05 MED ORDER — OXYCODONE-ACETAMINOPHEN 5-325 MG PO TABS
1.0000 | ORAL_TABLET | Freq: Four times a day (QID) | ORAL | 0 refills | Status: DC | PRN
Start: 2020-12-05 — End: 2020-12-05

## 2020-12-05 MED ORDER — HYDROCODONE-ACETAMINOPHEN 5-325 MG PO TABS: 1.0000 | ORAL_TABLET | ORAL | 0 refills | Status: DC | PRN

## 2020-12-05 NOTE — ED Triage Notes (Signed)
Pt complains of chest pain, leg pain, and abdominal pain for several weeks. Pt states that she had an ovarian cyst and that's what causes her abdominal pain.

## 2020-12-05 NOTE — Discharge Instructions (Addendum)
Take lortab first for pain.  If that does not work, take Ambulance person.

## 2020-12-05 NOTE — ED Notes (Signed)
Patient transported to MRI 

## 2020-12-05 NOTE — ED Provider Notes (Addendum)
[facility] DEPT Provider Note   CSN: [id number] Arrival date & time: 12/05/20  0003     History Chief Complaint  Patient presents with   Chest Pain   Leg Pain   Abdominal Pain    Erin [name] is a 55 y.o. female.  Pt presents to the ED today with chest and abdominal pain.  Pt has waited for over 16 hours to be seen.  She had cp yesterday.  She does not have cp any more.  Most of pt's sx are in her lower abdomen.  She had a recent US which showed a large 9.5 cm mass in her left pelvis.  [facility]  She said she was told she needed to see a specialist.  She does not know what is going on.  Pt has also had some dysuria.  She was treated with keflex then with levaquin for 3 days and also treated with flagyl for trich.  It looks like a MRI pelvis has been scheduled for 11/10.      Past Medical History:  Diagnosis Date   Anxiety    Constipation    Depression    GERD (gastroesophageal reflux disease)    Pt states she no longer needs meds   Hyperlipidemia    Hypertension    Menorrhagia    OSA (obstructive sleep apnea) 11/25/2017   OSA on CPAP    Other fatigue    Shortness of breath on exertion    Type 2 diabetes mellitus ([place])    Vitamin D deficiency    Wears glasses     Patient Active Problem List   Diagnosis Date Noted   Tear of right rotator cuff 04/04/2020   Vitamin D deficiency 04/02/2020   Other fatigue 03/13/2020   Shortness of breath on exertion 03/13/2020   OSA (obstructive sleep apnea) 11/25/2017   Obesity hypoventilation syndrome ([place]) 11/25/2017   Mallet finger of right hand 09/03/2017   Anterolisthesis 04/17/2017   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[place]) 04/17/2017   Degenerative disc disease, lumbar 04/17/2017   Class 3 severe obesity with serious comorbidity and body mass index (BMI) of 50.0 to 59.9 in adult ([place]) 06/01/2016   Diabetes mellitus ([place]) 05/27/2016   Essential hypertension  04/13/2016   Menorrhagia 08/26/2015    Class: Present on Admission    Past Surgical History:  Procedure Laterality Date   BREAST EXCISIONAL BIOPSY     DILITATION & CURRETTAGE/HYSTROSCOPY WITH NOVASURE ABLATION N/A 08/26/2015   Procedure: DILATATION & CURETTAGE/HYSTEROSCOPY WITH NOVASURE ABLATION;  Surgeon: [name], MD;  Location: [facility];  Service: Gynecology;  Laterality: N/A;   EXCISIONAL LEFT BREAST BX  11-14-2007   benign   LAPAROSCOPIC TUBAL LIGATION Bilateral 08/26/2015   Procedure: LAPAROSCOPIC TUBAL LIGATION;  Surgeon: [name], MD;  Location: [facility];  Service: Gynecology;  Laterality: Bilateral;   SHOULDER ARTHROSCOPY WITH ROTATOR CUFF REPAIR AND SUBACROMIAL DECOMPRESSION Right 04/08/2020   Procedure: SHOULDER ARTHROSCOPY WITH ROTATOR CUFF REPAIR AND SUBACROMIAL DECOMPRESSION;  Surgeon: Tania Ade, MD;  Location: [facility];  Service: Orthopedics;  Laterality: Right;  NEED 90 MINUTES FOR THIS CASE   THYROID LOBECTOMY Right 04-30-2008     OB History     Gravida  0   Para  0   Term  0   Preterm  0   AB  0   Living  0      SAB  0   IAB  0   Ectopic  0   Multiple  0   Live Births  0           Family History  Problem Relation Age of Onset   Stroke Mother    Diabetes Mother    Hypertension Mother    Obesity Mother    Glaucoma Father    Alcoholism Father    Heart disease Paternal Grandmother        enlarged heart   Diabetes Half-Sister    Hypertension Half-Sister    Colon cancer Neg Hx    Esophageal cancer Neg Hx    Stomach cancer Neg Hx    Colon polyps Neg Hx    Rectal cancer Neg Hx     Social History   Tobacco Use   Smoking status: Some Days    Years: 15.00    Types: Cigarettes    Last attempt to quit: 2001    Years since quitting: 21.8   Smokeless tobacco: Never  Vaping Use   Vaping Use: Never used  Substance Use Topics   Alcohol use: No   Drug use: No    Home Medications Prior to  Admission medications   Medication Sig Start Date End Date Taking? Authorizing Provider  oxyCODONE-acetaminophen (PERCOCET/ROXICET) 5-325 MG tablet Take 1 tablet by mouth every 6 (six) hours as needed for severe pain. 12/05/20  Yes Isla Pence, MD  amLODipine (NORVASC) 5 MG tablet TAKE 1 TABLET (5 MG TOTAL) BY MOUTH DAILY. 03/06/20 03/06/21  Fay Records, MD  buPROPion (WELLBUTRIN SR) 200 MG 12 hr tablet Take 1 tablet (200 mg total) by mouth every morning. 10/10/20   Dennard Nip D, MD  carvedilol (COREG) 6.25 MG tablet TAKE 1 TABLET BY MOUTH 2 TIMES DAILY WITH A MEAL. Patient taking differently: Take 6.25 mg by mouth daily. 03/06/20 03/06/21  Fay Records, MD  dexamethasone (DECADRON) 1 MG tablet Take 1 tablet (1 mg total) by mouth at bedtime at 11PM prior to 8AM cortisol lab draw 11/18/20     glycopyrrolate (ROBINUL) 1 MG tablet Take 1 tablet by mouth daily. 06/16/17   [provider]  hydrochlorothiazide (HYDRODIURIL) 25 MG tablet TAKE 1 TABLET (25 MG TOTAL) BY MOUTH DAILY. 03/06/20 03/06/21  Fay Records, MD  HYDROcodone-acetaminophen (NORCO/VICODIN) 5-325 MG tablet Take 1 tablet by mouth every 4 (four) hours as needed. 12/05/20   Isla Pence, MD  metFORMIN (GLUCOPHAGE) 500 MG tablet Take 1 tablet (500 mg total) by mouth daily. 07/10/20     metroNIDAZOLE (FLAGYL) 500 MG tablet Take 1 tablet (500 mg total) by mouth 2 (two) times daily. One po bid x 7 days 12/01/20   Malvin Johns, MD  olmesartan (BENICAR) 40 MG tablet TAKE 1 TABLET (40 MG TOTAL) BY MOUTH DAILY. 03/06/20 03/06/21  Fay Records, MD  phenazopyridine (PYRIDIUM) 200 MG tablet Take 1 tablet (200 mg total) by mouth 3 (three) times daily as needed for pain. 11/27/20   Caccavale, Sophia, PA-C  rosuvastatin (CRESTOR) 10 MG tablet TAKE 1 TABLET (10 MG TOTAL) BY MOUTH DAILY. 03/06/20 03/06/21  Fay Records, MD  Semaglutide, 1 MG/DOSE, (OZEMPIC, 1 MG/DOSE,) 4 MG/3ML SOPN Inject 1 mg into the skin once a week. 10/10/20   Dennard Nip  D, MD  Vitamin D, Ergocalciferol, (DRISDOL) 1.25 MG (50000 UNIT) CAPS capsule Take 1 capsule (50,000 Units total) by mouth every 7 (seven) days. 10/10/20   Starlyn Skeans, MD    Allergies    Patient has no  known allergies.  Review of Systems   Review of Systems  Gastrointestinal:  Positive for abdominal pain.  All other systems reviewed and are negative.  Physical Exam Updated Vital Signs BP (!) 175/93   Pulse (!) 103   Temp 99 F (37.2 C) (Oral)   Resp (!) 21   SpO2 95%   Physical Exam Vitals and nursing note reviewed.  Constitutional:      Appearance: She is well-developed. She is obese.  HENT:     Head: Normocephalic and atraumatic.  Eyes:     Extraocular Movements: Extraocular movements intact.     Pupils: Pupils are equal, round, and reactive to light.  Cardiovascular:     Rate and Rhythm: Normal rate and regular rhythm.     Heart sounds: Normal heart sounds.  Pulmonary:     Effort: Pulmonary effort is normal.  Abdominal:     General: Bowel sounds are normal.     Palpations: Abdomen is soft.     Tenderness: There is abdominal tenderness in the left lower quadrant.  Musculoskeletal:     Cervical back: Normal range of motion and neck supple.  Skin:    General: Skin is warm.     Capillary Refill: Capillary refill takes less than 2 seconds.  Neurological:     General: No focal deficit present.     Mental Status: She is alert and oriented to person, place, and time.  Psychiatric:        Mood and Affect: Mood normal.        Behavior: Behavior normal.    ED Results / Procedures / Treatments   Labs (all labs ordered are listed, but only abnormal results are displayed) Labs Reviewed  CBC WITH DIFFERENTIAL/PLATELET - Abnormal; Notable for the following components:      Result Value   WBC 14.8 (*)    Neutro Abs 12.1 (*)    Monocytes Absolute 1.1 (*)    Abs Immature Granulocytes 0.35 (*)    All other components within normal limits  COMPREHENSIVE METABOLIC  PANEL - Abnormal; Notable for the following components:   Sodium 133 (*)    Potassium 2.8 (*)    Chloride 96 (*)    Glucose, Bld 165 (*)    Albumin 2.7 (*)    AST 73 (*)    ALT 58 (*)    Alkaline Phosphatase 344 (*)    All other components within normal limits  URINALYSIS, ROUTINE W REFLEX MICROSCOPIC - Abnormal; Notable for the following components:   Color, Urine AMBER (*)    APPearance HAZY (*)    Hgb urine dipstick SMALL (*)    Ketones, ur 5 (*)    Protein, ur 30 (*)    Leukocytes,Ua SMALL (*)    Bacteria, UA RARE (*)    All other components within normal limits  LIPASE, BLOOD    EKG EKG Interpretation  Date/Time:  Thursday December 05 2020 00:24:18 EDT Ventricular Rate:  97 PR Interval:  156 QRS Duration: 85 QT Interval:  363 QTC Calculation: 462 R Axis:   12 Text Interpretation: Sinus rhythm Probable left atrial enlargement No significant change since last tracing Confirmed by Isla Pence [phone]) on 12/05/2020 4:20:54 PM  Radiology MR PELVIS W WO CONTRAST  Result Date: 12/05/2020 CLINICAL DATA:  55 year old female with indeterminate hypoechoic left adnexal mass identified in the setting of left lower quadrant pain. EXAM: MRI PELVIS WITHOUT AND WITH CONTRAST TECHNIQUE: Multiplanar multisequence MR imaging of the pelvis was performed both  before and after administration of intravenous contrast. CONTRAST:  15mL GADAVIST GADOBUTROL 1 MMOL/ML IV SOLN COMPARISON:  12/01/2020 pelvic sonogram and unenhanced CT abdomen/pelvis. FINDINGS: Urinary Tract: Mild diffuse bladder wall thickening. There is a poorly marginated enhancing solid 3.7 x 2.8 x 3.0 cm mass centered at the urethra (series 32/image 42 and series 30/image 67), extending from the vesicourethral junction throughout the length of the urethra. There appears to be an underlying right periurethral diverticulum (series 5/image 27). At the inferior tip of the urethra, there is a simple 0.9 x 0.9 x 0.9 cm cystic lesion  compatible with a Skene's duct cyst. Bowel: Visualized small and large bowel are normal caliber with no bowel wall thickening. Vascular/Lymphatic: Mild bilateral inguinal lymphadenopathy measuring up to 1.4 cm short axis diameter on the right (series 19/image 71) and 1.4 cm on the left (series 19/image 70). Moderate bilateral external iliac lymphadenopathy measuring up to 1.7 cm bilaterally (series 19/image 53 on the right and 56 on the left). Bilateral common iliac lymphadenopathy, largest 1.6 cm on the right (series 19/image 27) and 1.7 cm on the left (series 19/image 14). Reproductive: Uterus: The anteverted uterus measures 8.2 x 3.7 x 5.5 cm. No uterine fibroids. Inner myometrium (junctional zone) measures 12 mm in thickness, which is compatible with mild diffuse uterine adenomyosis. Endometrium measures 4 mm in bilayer thickness, which is within normal limits. No endometrial cavity fluid or focal endometrial mass. Poorly marginated enhancing solid mass of the left uterine cervix measuring 2.4 x 2.3 x 3.0 cm (series 5/image 21 and series 8/image 22), which appears to disrupt the normal cervical fibrous stroma without frank parametrial invasion. Ovaries and Adnexa: The right ovary measures 4.0 x 2.4 x 4.3 cm and contains multiple solid avidly enhancing masses, largest 2.3 x 2.0 cm (series 19/image 33). The left ovary measures 8.3 x 7.7 x 8.5 cm and is replaced by a predominantly solid avidly enhancing mass. Other: No abnormal free fluid in the pelvis. No focal pelvic fluid collection. Musculoskeletal: Diffuse patchy confluent nodular replacement of the pelvic osseous structures, best seen on axial series 7. IMPRESSION: 1. Poorly marginated enhancing 3.7 x 2.8 x 3.0 cm mass centered at the urethra, extending from the vesicourethral junction throughout the length of the female urethra, with probable underlying right periurethral diverticulum. This mass is concerning for malignancy. 2. Solid avidly enhancing  bilateral ovarian masses, largest 2.3 cm on the right and 8.3 cm on the left, worrisome for bilateral ovarian metastases. 3. Poorly marginated enhancing 2.4 x 2.3 x 3.0 cm mass in the left uterine cervix, which appears to disrupt the normal cervical fibrous stroma without frank parametrial invasion, suspicious for malignancy. 4. Mild-to-moderate bilateral common iliac, bilateral external iliac and bilateral inguinal lymphadenopathy, suspicious for metastatic disease. 5. Diffuse patchy confluent nodular replacement of the pelvic osseous structures, suspicious for osseous metastatic disease. 6. Mild diffuse uterine adenomyosis. Electronically Signed   By: Ilona Sorrel M.D.   On: 12/05/2020 20:31    Procedures Procedures   Medications Ordered in ED Medications  morphine 4 MG/ML injection 4 mg (has no administration in time range)  morphine 4 MG/ML injection 4 mg (4 mg Intravenous Given 12/05/20 1658)  gadobutrol (GADAVIST) 1 MMOL/ML injection 10 mL (10 mLs Intravenous Contrast Given 12/05/20 1853)    ED Course  I have reviewed the triage vital signs and the nursing notes.  Pertinent labs & imaging results that were available during my care of the patient were reviewed by me and considered in  my medical decision making (see chart for details).    MDM Rules/Calculators/A&P                           I spoke with Dr. Royston Sinner (on call for Dr. Radene Knee).  She was very helpful and able to access the pt's chart.  It appears that Dr. Radene Knee is worried about cancer and has referred her to a gyn-onc specialist.  His office is taking care of the referral and the gyn-onc doctor's office will be contacting patient.  I will order the pelvic MR to try to move things along.  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  Results d/w Dr. Royston Sinner who does not have any other recommendations for now.  Pt is to f/u with oncology.  I will treat pt's pain.  Pt requesting to be d/c with a foley catheter.  Pt  is able to urinate, but said her pain makes it hard for her to get around.  The nurse explained it causes utis, but she really wanted one, so we did as she requested.   Final Clinical Impression(s) / ED Diagnoses Final diagnoses:  Ovarian mass, left  Left lower quadrant abdominal pain    Rx / DC Orders ED Discharge Orders          Ordered    HYDROcodone-acetaminophen (NORCO/VICODIN) 5-325 MG tablet  Every 4 hours PRN        12/05/20 1906    oxyCODONE-acetaminophen (PERCOCET/ROXICET) 5-325 MG tablet  Every 6 hours PRN        12/05/20 2100             Isla Pence, MD 12/05/20 2102    Isla Pence, MD 12/05/20 2250

## 2020-12-05 NOTE — ED Notes (Signed)
Pt returned from MRI °

## 2020-12-09 ENCOUNTER — Telehealth: Payer: Self-pay | Admitting: *Deleted

## 2020-12-09 NOTE — Telephone Encounter (Signed)
Spoke with the patient and scheduled a new patient appt with Dr Berline Lopes on 11/4 at 11:45 am. Patient given the address and phone number for the clinic. Advised the patient if she had concerns or issues before Dr Berline Lopes saw her to either go to the ER or call Dr Sherran Needs office

## 2020-12-11 ENCOUNTER — Other Ambulatory Visit (HOSPITAL_COMMUNITY): Payer: Self-pay

## 2020-12-11 MED ORDER — TRAMADOL HCL 50 MG PO TABS
50.0000 mg | ORAL_TABLET | Freq: Four times a day (QID) | ORAL | 0 refills | Status: DC
  Filled 2020-12-11: qty 20, 5d supply, fill #0

## 2020-12-12 ENCOUNTER — Encounter: Payer: Self-pay | Admitting: Gynecologic Oncology

## 2020-12-12 ENCOUNTER — Encounter: Payer: Self-pay | Admitting: Obstetrics and Gynecology

## 2020-12-13 ENCOUNTER — Other Ambulatory Visit: Payer: Self-pay

## 2020-12-13 ENCOUNTER — Inpatient Hospital Stay: Payer: Self-pay

## 2020-12-13 ENCOUNTER — Telehealth: Payer: Self-pay | Admitting: Oncology

## 2020-12-13 ENCOUNTER — Encounter: Payer: Self-pay | Admitting: Gynecologic Oncology

## 2020-12-13 ENCOUNTER — Other Ambulatory Visit (HOSPITAL_COMMUNITY): Payer: Self-pay

## 2020-12-13 ENCOUNTER — Inpatient Hospital Stay: Payer: Self-pay | Attending: Gynecologic Oncology | Admitting: Gynecologic Oncology

## 2020-12-13 VITALS — BP 152/87 | HR 102 | Temp 98.4°F | Resp 18 | Ht 64.0 in | Wt 280.0 lb

## 2020-12-13 DIAGNOSIS — K219 Gastro-esophageal reflux disease without esophagitis: Secondary | ICD-10-CM | POA: Insufficient documentation

## 2020-12-13 DIAGNOSIS — R591 Generalized enlarged lymph nodes: Secondary | ICD-10-CM

## 2020-12-13 DIAGNOSIS — D413 Neoplasm of uncertain behavior of urethra: Secondary | ICD-10-CM | POA: Insufficient documentation

## 2020-12-13 DIAGNOSIS — N838 Other noninflammatory disorders of ovary, fallopian tube and broad ligament: Secondary | ICD-10-CM

## 2020-12-13 DIAGNOSIS — Z79891 Long term (current) use of opiate analgesic: Secondary | ICD-10-CM | POA: Insufficient documentation

## 2020-12-13 DIAGNOSIS — R194 Change in bowel habit: Secondary | ICD-10-CM | POA: Insufficient documentation

## 2020-12-13 DIAGNOSIS — D3912 Neoplasm of uncertain behavior of left ovary: Secondary | ICD-10-CM | POA: Insufficient documentation

## 2020-12-13 DIAGNOSIS — Z7984 Long term (current) use of oral hypoglycemic drugs: Secondary | ICD-10-CM | POA: Insufficient documentation

## 2020-12-13 DIAGNOSIS — R1030 Lower abdominal pain, unspecified: Secondary | ICD-10-CM

## 2020-12-13 DIAGNOSIS — F32A Depression, unspecified: Secondary | ICD-10-CM | POA: Insufficient documentation

## 2020-12-13 DIAGNOSIS — E119 Type 2 diabetes mellitus without complications: Secondary | ICD-10-CM | POA: Insufficient documentation

## 2020-12-13 DIAGNOSIS — Z87891 Personal history of nicotine dependence: Secondary | ICD-10-CM | POA: Insufficient documentation

## 2020-12-13 DIAGNOSIS — R339 Retention of urine, unspecified: Secondary | ICD-10-CM | POA: Insufficient documentation

## 2020-12-13 DIAGNOSIS — N368 Other specified disorders of urethra: Secondary | ICD-10-CM

## 2020-12-13 DIAGNOSIS — I10 Essential (primary) hypertension: Secondary | ICD-10-CM | POA: Insufficient documentation

## 2020-12-13 DIAGNOSIS — F419 Anxiety disorder, unspecified: Secondary | ICD-10-CM | POA: Insufficient documentation

## 2020-12-13 DIAGNOSIS — R1032 Left lower quadrant pain: Secondary | ICD-10-CM | POA: Insufficient documentation

## 2020-12-13 DIAGNOSIS — D3911 Neoplasm of uncertain behavior of right ovary: Secondary | ICD-10-CM | POA: Insufficient documentation

## 2020-12-13 DIAGNOSIS — M79605 Pain in left leg: Secondary | ICD-10-CM | POA: Insufficient documentation

## 2020-12-13 DIAGNOSIS — R970 Elevated carcinoembryonic antigen [CEA]: Secondary | ICD-10-CM | POA: Insufficient documentation

## 2020-12-13 DIAGNOSIS — R630 Anorexia: Secondary | ICD-10-CM | POA: Insufficient documentation

## 2020-12-13 DIAGNOSIS — Z79899 Other long term (current) drug therapy: Secondary | ICD-10-CM | POA: Insufficient documentation

## 2020-12-13 DIAGNOSIS — R634 Abnormal weight loss: Secondary | ICD-10-CM | POA: Insufficient documentation

## 2020-12-13 DIAGNOSIS — E559 Vitamin D deficiency, unspecified: Secondary | ICD-10-CM | POA: Insufficient documentation

## 2020-12-13 DIAGNOSIS — E785 Hyperlipidemia, unspecified: Secondary | ICD-10-CM | POA: Insufficient documentation

## 2020-12-13 LAB — CEA (IN HOUSE-CHCC): CEA (CHCC-In House): 1864.09 ng/mL — ABNORMAL HIGH (ref 0.00–5.00)

## 2020-12-13 MED ORDER — OXYCODONE HCL 5 MG PO TABS
5.0000 mg | ORAL_TABLET | Freq: Four times a day (QID) | ORAL | 0 refills | Status: DC | PRN
  Filled 2020-12-13: qty 20, 5d supply, fill #0

## 2020-12-13 MED ORDER — OXYCODONE HCL 5 MG PO CAPS
5.0000 mg | ORAL_CAPSULE | Freq: Four times a day (QID) | ORAL | 0 refills | Status: DC | PRN
  Filled 2020-12-13: qty 20, 5d supply, fill #0

## 2020-12-13 NOTE — Patient Instructions (Signed)
It was a pleasure meeting you today.  I have sent in a prescription for oxycodone to the [facility].  I will let you know the results of blood work that you had done today.  Our nurse navigator is working to see if we can get your cystoscopy scheduled any sooner than the 15th.  Your PET scan is currently scheduled on the 17th.  My office will call next week to see if there are any cancellations to move this up.  Based on what I am seeing in feeling today, the front side of your vagina feels hard and abnormal.  I do not feel a connection with your cervix and the cervix itself feels smooth.  Based on findings from your cystoscopy and PET scan, we may have to proceed for surgery for additional biopsies.  I will call you to coordinate this as needed.

## 2020-12-13 NOTE — Telephone Encounter (Signed)
[facility] and left a message for [place], [place] with Dr. Abner Greenspan regarding moving up patient's cystoscopy on 12/24/20.

## 2020-12-13 NOTE — H&P (View-Only) (Signed)
GYNECOLOGIC ONCOLOGY NEW PATIENT CONSULTATION   Patient Name: Erin [name]  Patient Age: 55 y.o. Date of Service: 12/13/20 Referring Provider: Dr. 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   I reviewed with the patient and her niece imaging work up this far. We looked at MRI images together today. I discussed findings concerning for metastatic cancer including urethral mass, bilateral ovarian masses, and evidence of bony metastases. On my exam, I am not able to clearly see the cervix, but there is clear abnormality along the anterior vaginal wall, raising the concern for urothelial versus GYN malignancy. Patient has a normal Pap smear with normal exams previously documented within the last year. Although I can't see the cervix, it does feel normal on palpation.   In terms of next steps, I ordered a PET scan to evaluate for distant metastatic disease and to better evaluate the bone findings on recent MRI. I also reached out to the urologist that the patient has seen and with whom she is scheduled to have cystoscopy later this month. I will try to coordinate with him being able to be present for an exam under anesthesia when he does her cystoscopy and biopsies. I have ordered CA- 125 and CEA to be drawn today.   If the work up outlined above does not reveal a primary diagnosis, we discuss there may be need for diagnostic procedure in the operating room with resection of one or both ovarian masses or other biopsies to help obtain a diagnosis.  Given ongoing pain, I sent a prescription for oxycodone, which has helped improve the patient's baseline pain, to her pharmacy. We discussed using Tylenol as well as ibuprofen to aide her pain management strategy.  She describes a several year history of changed her bowel function. While this seems like more of a chronic issue,  she may require a referral to gastroenterology.  A copy of this note was sent to the patient's referring provider.   90 minutes of total time was spent for this patient encounter, including preparation, face-to-face counseling with the patient and coordination of care, and documentation of the encounter.   Jeral Pinch, MD  Division of Gynecologic Oncology  Department of Obstetrics and Gynecology  [facility]  ___________________________________________  Chief Complaint: Chief Complaint  Patient presents with   Ovarian mass    History of Present Illness:  Erin [name] is a 55 y.o. y.o. female who is seen in consultation at the request of Dr. Radene Knee for an evaluation of multiple abnormal findings on recent MRI raising the concern for metastatic cancer, possible GYN origin.  Patient notes initially noticing in July that there was a change to her urine stream.  She describes it as being slower than normal.  Beginning in August, she would have to push frequently in order to be able to void.  She saw a gynecologist and had testing which ruled out a urinary tract infection.  She presented to the emergency department on 9/11 secondary to dysuria and continued difficulty voiding.  Given evidence of urinary retention, Foley catheter was placed and she was discharged home with a catheter to follow-up with urology.  She then saw urology and had the catheter in place for approximately 2 weeks.  After the catheter was discontinued, the patient began experiencing difficulty urinating again.  She was treated for urinary tract infection with Keflex.  She  presented again in mid October secondary to urinary symptoms and antibiotics were changed to Levaquin.  She then presented again to the emergency department secondary to leg pain as well as left groin and lower abdominal pain.  Imaging at that visit showed a new left adnexal mass.  The plan was for follow-up outpatient with  OB/GYN.  She was also treated for trichomonas.  She then saw Dr. Radene Knee for follow-up on 10/26 with plan for referral to GYN oncology.  Most recently, the patient presented on 10/27 with chest pain, leg pain, and abdominal pain.  She underwent pelvic MRI as well as repeat pelvic ultrasound given her symptoms.  Unfortunately, pelvic MRI revealed a mass in her urethra, bilateral ovarian masses, paracervical mass, and diffuse findings concerning for bony metastases in her pelvis.  Imaging 9/11: CT A/P shows 2.4 cm tubular area of hypoattenuation in the region of the cervix and urethra. This may represent a nabothian cyst, or distension of the urethra. Further evaluation with pelvic ultrasound may be considered. 9/29: Pelvic ultrasound showed normal uterus and endometrium.  Simple appearing 3.9 cm right ovarian cyst noted with adjacent fluid collection in the right adnexa suspected to represent a bowel loop filled with fluid. 10/23: CT renal stone study shows marked interval enlargement of left ovarian mass, new since CT scan in September favored to represent a hemorrhagic cyst.  Soft tissue fullness in the region of the cervix is again noted and similar to prior exam.  Retroperitoneal and pelvic adenopathy identified, may be reactive in the setting of infection. 10/23: Pelvic ultrasound reveals 9.5 cm left adnexal mass, hypoechoic, with central internal cystic density.  Bulkiness of cervix, suboptimally assessed. 10/27: Pelvic MRI reveals poorly marginated enhancing 3.7 cm mass centered in the urethra extending from the vesicourethral junction with probable underlying right periurethral diverticulum.  Solid avidly enhancing bilateral ovarian masses, larger on the left measuring 8.3 cm, worrisome for bilateral ovarian metastases.  Poorly marginated 2.4 cm mass in the left uterine cervix although no frank parametrial invasion identified.  Mild to moderate bilateral common iliac, external iliac, and inguinal  adenopathy suspicious for malignancy.  Diffuse patchy confluent nodular replacement of the pelvic osseous structures, suspicious for osseous metastatic disease.  Today, the patient presents to her appointment with her niece.  She continues to have left lower abdominal pain.  She describes this pain as being in her low left pelvis and starting on 9/28 after her catheter was removed the first time and she underwent pelvic ultrasound with transvaginal images.  She denies any relief of this pain.  She uses oxycodone and hydrocodone with some relief in the past for her abdominal and leg pain.  These medications have also allowed her to get some sleep at night.  She also has pain in her legs, which started as a burning sensation in her low back and travels down her legs.  It hurts to walk, and she uses a cane to get around the house.  The burning sensation that she initially felt in her low back and down her legs has resolved now but she continues to have intermittent pain in her legs.  She has a catheter again since she was last seen at [facility].  She has follow-up to see urology on the 15th for cystoscopy.  She notes that certain medications make her urine looks very concentrated.  She describes her bowel function is irregular since her colonoscopy in 2019.  She was supposed to get follow-up colonoscopy 3 years  after her last given polyp findings.  She now has a bowel movement once or twice a week.  She was taking MiraLAX until recently.  She denies any recent changes to her bowel function.  She notes some fluctuation to her weight and was working with the healthy wellness weight program through [facility] [employer].  Over the last week, per her report, she has lost about 13 pounds.  She notes having a decreased appetite although describes this as not knowing what to eat or what she wants to eat.  She does not have much interest in eating.  She denies any nausea, emesis, early satiety.  She notes having some discharge since  her catheter was placed.  She had some blood after a bowel movement about 5 days ago.  She denies any vaginal bleeding or discharge.  Patient lives in [place] with a female friend.  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  She is not currently working.  She has a history of tobacco use and quit 27 years ago although at the time of her shoulder surgery in February, which was at [place] [facility] where her husband died years ago, she had significant stressors and started smoking again.  She has not had a cigarette in the last week.  She denies any alcohol use, reports THC use.  PAST MEDICAL HISTORY:  Past Medical History:  Diagnosis Date   Anxiety    Constipation    Depression    GERD (gastroesophageal reflux disease)    Pt states she no longer needs meds   Hyperlipidemia    Hypertension    Menorrhagia    OSA (obstructive sleep apnea) 11/25/2017   OSA on CPAP    Other fatigue    Shortness of breath on exertion    Type 2 diabetes mellitus ([place])    Vitamin D deficiency    Wears glasses      PAST SURGICAL HISTORY:  Past Surgical History:  Procedure Laterality Date   BREAST EXCISIONAL BIOPSY     DILITATION & CURRETTAGE/HYSTROSCOPY WITH NOVASURE ABLATION N/A 08/26/2015   Procedure: DILATATION & CURETTAGE/HYSTEROSCOPY WITH NOVASURE ABLATION;  Surgeon: [name], MD;  Location: [place];  Service: Gynecology;  Laterality: N/A;   EXCISIONAL LEFT BREAST BX  11-14-2007   benign   LAPAROSCOPIC TUBAL LIGATION Bilateral 08/26/2015   Procedure: LAPAROSCOPIC TUBAL LIGATION;  Surgeon: [name], MD;  Location: [facility];  Service: Gynecology;  Laterality: Bilateral;   SHOULDER ARTHROSCOPY WITH ROTATOR CUFF REPAIR AND SUBACROMIAL DECOMPRESSION Right 04/08/2020   Procedure: SHOULDER ARTHROSCOPY WITH ROTATOR CUFF REPAIR AND SUBACROMIAL DECOMPRESSION;  Surgeon: Tania Ade, MD;  Location: [facility];   Service: Orthopedics;  Laterality: Right;  NEED 90 MINUTES FOR THIS CASE   THYROID LOBECTOMY Right 04-30-2008    OB/GYN HISTORY:  OB History  Gravida Para Term Preterm AB Living  0 0 0 0 0 0  SAB IAB Ectopic Multiple Live Births  0 0 0 0 0    No LMP recorded. Patient has had an ablation.  Age at menarche: 42 Age at menopause: 65 Hx of HRT: Denies Hx of STDs: Yes, recently treated for trichomonas Last pap: 01/2020: Negative, benign reactive changes noted.  Satisfactory for evaluation, endocervical and transformation zone component absent.  High risk HPV negative. History of abnormal pap smears: yes  SCREENING STUDIES:  Last mammogram: 2020  Last colonoscopy: 2019 Last bone mineral density:  2018  MEDICATIONS: Outpatient Encounter Medications as of 12/13/2020  Medication Sig   amLODipine (NORVASC) 5 MG tablet TAKE 1 TABLET (5 MG TOTAL) BY MOUTH DAILY.   carvedilol (COREG) 6.25 MG tablet TAKE 1 TABLET BY MOUTH 2 TIMES DAILY WITH A MEAL. (Patient taking differently: Take 6.25 mg by mouth daily.)   hydrochlorothiazide (HYDRODIURIL) 25 MG tablet TAKE 1 TABLET (25 MG TOTAL) BY MOUTH DAILY.   metFORMIN (GLUCOPHAGE) 500 MG tablet Take 1 tablet (500 mg total) by mouth daily.   olmesartan (BENICAR) 40 MG tablet TAKE 1 TABLET (40 MG TOTAL) BY MOUTH DAILY.   oxyCODONE (OXY IR/ROXICODONE) 5 MG immediate release tablet Take 1 tablet (5 mg total) by mouth every 6 (six) hours as needed for up to 20 doses for severe pain.   rosuvastatin (CRESTOR) 10 MG tablet TAKE 1 TABLET (10 MG TOTAL) BY MOUTH DAILY.   [DISCONTINUED] oxycodone (OXY-IR) 5 MG capsule Take 1 capsule (5 mg total) by mouth every 6 (six) hours as needed for up to 20 doses.   buPROPion (WELLBUTRIN SR) 200 MG 12 hr tablet Take 1 tablet (200 mg total) by mouth every morning. (Patient not taking: Reported on 12/12/2020)   dexamethasone (DECADRON) 1 MG tablet Take 1 tablet (1 mg total) by mouth at bedtime at 11PM prior to 8AM cortisol lab  draw (Patient not taking: Reported on 12/12/2020)   glycopyrrolate (ROBINUL) 1 MG tablet Take 1 tablet by mouth daily. (Patient not taking: Reported on 12/12/2020)   HYDROcodone-acetaminophen (NORCO/VICODIN) 5-325 MG tablet Take 1 tablet by mouth every 4 (four) hours as needed. (Patient not taking: Reported on 12/12/2020)   metroNIDAZOLE (FLAGYL) 500 MG tablet Take 1 tablet (500 mg total) by mouth 2 (two) times daily. One po bid x 7 days (Patient not taking: Reported on 12/12/2020)   oxyCODONE-acetaminophen (PERCOCET/ROXICET) 5-325 MG tablet Take 1 tablet by mouth every 6 (six) hours as needed for severe pain. (Patient not taking: Reported on 12/12/2020)   phenazopyridine (PYRIDIUM) 200 MG tablet Take 1 tablet (200 mg total) by mouth 3 (three) times daily as needed for pain. (Patient not taking: Reported on 12/12/2020)   Semaglutide, 1 MG/DOSE, (OZEMPIC, 1 MG/DOSE,) 4 MG/3ML SOPN Inject 1 mg into the skin once a week. (Patient not taking: Reported on 12/12/2020)   traMADol (ULTRAM) 50 MG tablet Take 1 tablet (50 mg total) by mouth every 6 (six) hours for 5 days (Patient not taking: Reported on 12/12/2020)   Vitamin D, Ergocalciferol, (DRISDOL) 1.25 MG (50000 UNIT) CAPS capsule Take 1 capsule (50,000 Units total) by mouth every 7 (seven) days. (Patient not taking: Reported on 12/12/2020)   No facility-administered encounter medications on file as of 12/13/2020.    ALLERGIES:  Allergies  Allergen Reactions   Tramadol Other (See Comments)    Per patient increased HR and sweating     FAMILY HISTORY:  Family History  Problem Relation Age of Onset   Stroke Mother    Diabetes Mother    Hypertension Mother    Obesity Mother    Glaucoma Father    Alcoholism Father    Heart disease Paternal Grandmother        enlarged heart   Diabetes Half-Sister    Hypertension Half-Sister    Colon cancer Neg Hx    Esophageal cancer Neg Hx    Stomach cancer Neg Hx    Colon polyps Neg Hx    Rectal cancer Neg Hx     Breast cancer Neg Hx  Ovarian cancer Neg Hx    Endometrial cancer Neg Hx    Pancreatic cancer Neg Hx    Prostate cancer Neg Hx      SOCIAL HISTORY:  Social Connections: Not on file    REVIEW OF SYSTEMS:  Pertinent positives as per HPI.  Patient also notes fatigue, hot flashes, difficulty walking, vaginal discharge. Denies appetite changes, fevers, chills. Denies hearing loss, neck lumps or masses, mouth sores, ringing in ears or voice changes. Denies cough or wheezing.  Denies shortness of breath. Denies chest pain or palpitations. Denies leg swelling. Denies abdominal distention, blood in stools, constipation, diarrhea, nausea, vomiting, or early satiety. Denies pain with intercourse, frequency, hematuria or incontinence. Denies vaginal bleeding.   Denies joint pain, back pain or muscle pain/cramps. Denies itching, rash, or wounds. Denies dizziness, headaches, numbness or seizures. Denies swollen lymph nodes or glands, denies easy bruising or bleeding. Denies anxiety, depression, confusion, or decreased concentration.  Physical Exam:  Vital Signs for this encounter:  Blood pressure (!) 152/87, pulse (!) 102, temperature 98.4 F (36.9 C), temperature source Oral, resp. rate 18, height 5\' 4"  (1.626 m), weight 280 lb (127 kg), SpO2 95 %. Body mass index is 48.06 kg/m. General: Alert, oriented, no acute distress.  HEENT: Normocephalic, atraumatic. Sclera anicteric.  Chest: Clear to auscultation bilaterally. No wheezes, rhonchi, or rales. Cardiovascular: Regular rate and rhythm, no murmurs, rubs, or gallops.  Abdomen: Obese. Normoactive bowel sounds. Soft, nondistended, nontender to palpation. No masses or hepatosplenomegaly appreciated. No palpable fluid wave.  Extremities: Grossly normal range of motion. Warm, well perfused. No edema bilaterally.  Skin: No rashes or lesions.  Lymphatics: No palpable cervical, supraclavicular, or inguinal adenopathy.  GU: Normal-appearing  external female genitalia with catheter in place with dark concentrated urine in her bag.  Speculum exam is quite difficult.  I was able to advance the speculum approximately 5 cm but then there is narrowing towards the apex of the vagina that limits any direct visualization of the cervix.  No discharge or bleeding noted.  On bimanual exam, almost the entire length of the anterior vagina is firm and fixed.  On palpation, the cervix itself is smooth with no obvious nodularity.  There is a gap between the cervix and the anterior vagina where firmness begins.  On rectovaginal exam, there is a 2 cm smooth mass appreciated within the parametria/cervix junction on the left.  Unable to appreciate adnexal mass within the cul-de-sac on exam.  LABORATORY AND RADIOLOGIC DATA:  Outside medical records were reviewed to synthesize the above history, along with the history and physical obtained during the visit.   Lab Results  Component Value Date   WBC 14.8 (H) 12/05/2020   HGB 12.4 12/05/2020   HCT 38.0 12/05/2020   PLT 291 12/05/2020   GLUCOSE 165 (H) 12/05/2020   CHOL 228 (H) 03/08/2020   TRIG 70 03/08/2020   HDL 52 03/08/2020   LDLCALC 164 (H) 03/08/2020   ALT 58 (H) 12/05/2020   AST 73 (H) 12/05/2020   NA 133 (L) 12/05/2020   K 2.8 (L) 12/05/2020   CL 96 (L) 12/05/2020   CREATININE 0.82 12/05/2020   BUN 18 12/05/2020   CO2 26 12/05/2020   TSH 2.320 03/08/2020   INR 1.0 04/26/2008   HGBA1C 6.7 (H) 04/04/2020

## 2020-12-13 NOTE — Progress Notes (Signed)
GYNECOLOGIC ONCOLOGY NEW PATIENT CONSULTATION   Patient Name: Erin [name]  Patient Age: 55 y.o. Date of Service: 12/13/20 Referring Provider: Dr. 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   I reviewed with the patient and her niece imaging work up this far. We looked at MRI images together today. I discussed findings concerning for metastatic cancer including urethral mass, bilateral ovarian masses, and evidence of bony metastases. On my exam, I am not able to clearly see the cervix, but there is clear abnormality along the anterior vaginal wall, raising the concern for urothelial versus GYN malignancy. Patient has a normal Pap smear with normal exams previously documented within the last year. Although I can't see the cervix, it does feel normal on palpation.   In terms of next steps, I ordered a PET scan to evaluate for distant metastatic disease and to better evaluate the bone findings on recent MRI. I also reached out to the urologist that the patient has seen and with whom she is scheduled to have cystoscopy later this month. I will try to coordinate with him being able to be present for an exam under anesthesia when he does her cystoscopy and biopsies. I have ordered CA- 125 and CEA to be drawn today.   If the work up outlined above does not reveal a primary diagnosis, we discuss there may be need for diagnostic procedure in the operating room with resection of one or both ovarian masses or other biopsies to help obtain a diagnosis.  Given ongoing pain, I sent a prescription for oxycodone, which has helped improve the patient's baseline pain, to her pharmacy. We discussed using Tylenol as well as ibuprofen to aide her pain management strategy.  She describes a several year history of changed her bowel function. While this seems like more of a chronic issue,  she may require a referral to gastroenterology.  A copy of this note was sent to the patient's referring provider.   90 minutes of total time was spent for this patient encounter, including preparation, face-to-face counseling with the patient and coordination of care, and documentation of the encounter.   Jeral Pinch, MD  Division of Gynecologic Oncology  Department of Obstetrics and Gynecology  [facility]  ___________________________________________  Chief Complaint: Chief Complaint  Patient presents with   Ovarian mass    History of Present Illness:  Erin [name] is a 55 y.o. y.o. female who is seen in consultation at the request of Dr. Radene Knee for an evaluation of multiple abnormal findings on recent MRI raising the concern for metastatic cancer, possible GYN origin.  Patient notes initially noticing in July that there was a change to her urine stream.  She describes it as being slower than normal.  Beginning in August, she would have to push frequently in order to be able to void.  She saw a gynecologist and had testing which ruled out a urinary tract infection.  She presented to the emergency department on 9/11 secondary to dysuria and continued difficulty voiding.  Given evidence of urinary retention, Foley catheter was placed and she was discharged home with a catheter to follow-up with urology.  She then saw urology and had the catheter in place for approximately 2 weeks.  After the catheter was discontinued, the patient began experiencing difficulty urinating again.  She was treated for urinary tract infection with Keflex.  She  presented again in mid October secondary to urinary symptoms and antibiotics were changed to Levaquin.  She then presented again to the emergency department secondary to leg pain as well as left groin and lower abdominal pain.  Imaging at that visit showed a new left adnexal mass.  The plan was for follow-up outpatient with  OB/GYN.  She was also treated for trichomonas.  She then saw Dr. Radene Knee for follow-up on 10/26 with plan for referral to GYN oncology.  Most recently, the patient presented on 10/27 with chest pain, leg pain, and abdominal pain.  She underwent pelvic MRI as well as repeat pelvic ultrasound given her symptoms.  Unfortunately, pelvic MRI revealed a mass in her urethra, bilateral ovarian masses, paracervical mass, and diffuse findings concerning for bony metastases in her pelvis.  Imaging 9/11: CT A/P shows 2.4 cm tubular area of hypoattenuation in the region of the cervix and urethra. This may represent a nabothian cyst, or distension of the urethra. Further evaluation with pelvic ultrasound may be considered. 9/29: Pelvic ultrasound showed normal uterus and endometrium.  Simple appearing 3.9 cm right ovarian cyst noted with adjacent fluid collection in the right adnexa suspected to represent a bowel loop filled with fluid. 10/23: CT renal stone study shows marked interval enlargement of left ovarian mass, new since CT scan in September favored to represent a hemorrhagic cyst.  Soft tissue fullness in the region of the cervix is again noted and similar to prior exam.  Retroperitoneal and pelvic adenopathy identified, may be reactive in the setting of infection. 10/23: Pelvic ultrasound reveals 9.5 cm left adnexal mass, hypoechoic, with central internal cystic density.  Bulkiness of cervix, suboptimally assessed. 10/27: Pelvic MRI reveals poorly marginated enhancing 3.7 cm mass centered in the urethra extending from the vesicourethral junction with probable underlying right periurethral diverticulum.  Solid avidly enhancing bilateral ovarian masses, larger on the left measuring 8.3 cm, worrisome for bilateral ovarian metastases.  Poorly marginated 2.4 cm mass in the left uterine cervix although no frank parametrial invasion identified.  Mild to moderate bilateral common iliac, external iliac, and inguinal  adenopathy suspicious for malignancy.  Diffuse patchy confluent nodular replacement of the pelvic osseous structures, suspicious for osseous metastatic disease.  Today, the patient presents to her appointment with her niece.  She continues to have left lower abdominal pain.  She describes this pain as being in her low left pelvis and starting on 9/28 after her catheter was removed the first time and she underwent pelvic ultrasound with transvaginal images.  She denies any relief of this pain.  She uses oxycodone and hydrocodone with some relief in the past for her abdominal and leg pain.  These medications have also allowed her to get some sleep at night.  She also has pain in her legs, which started as a burning sensation in her low back and travels down her legs.  It hurts to walk, and she uses a cane to get around the house.  The burning sensation that she initially felt in her low back and down her legs has resolved now but she continues to have intermittent pain in her legs.  She has a catheter again since she was last seen at [facility].  She has follow-up to see urology on the 15th for cystoscopy.  She notes that certain medications make her urine looks very concentrated.  She describes her bowel function is irregular since her colonoscopy in 2019.  She was supposed to get follow-up colonoscopy 3 years  after her last given polyp findings.  She now has a bowel movement once or twice a week.  She was taking MiraLAX until recently.  She denies any recent changes to her bowel function.  She notes some fluctuation to her weight and was working with the healthy wellness weight program through [facility] [employer].  Over the last week, per her report, she has lost about 13 pounds.  She notes having a decreased appetite although describes this as not knowing what to eat or what she wants to eat.  She does not have much interest in eating.  She denies any nausea, emesis, early satiety.  She notes having some discharge since  her catheter was placed.  She had some blood after a bowel movement about 5 days ago.  She denies any vaginal bleeding or discharge.  Patient lives in [place] with a female friend.  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  She is not currently working.  She has a history of tobacco use and quit 27 years ago although at the time of her shoulder surgery in February, which was at [place] [facility] where her husband died years ago, she had significant stressors and started smoking again.  She has not had a cigarette in the last week.  She denies any alcohol use, reports THC use.  PAST MEDICAL HISTORY:  Past Medical History:  Diagnosis Date   Anxiety    Constipation    Depression    GERD (gastroesophageal reflux disease)    Pt states she no longer needs meds   Hyperlipidemia    Hypertension    Menorrhagia    OSA (obstructive sleep apnea) 11/25/2017   OSA on CPAP    Other fatigue    Shortness of breath on exertion    Type 2 diabetes mellitus ([place])    Vitamin D deficiency    Wears glasses      PAST SURGICAL HISTORY:  Past Surgical History:  Procedure Laterality Date   BREAST EXCISIONAL BIOPSY     DILITATION & CURRETTAGE/HYSTROSCOPY WITH NOVASURE ABLATION N/A 08/26/2015   Procedure: DILATATION & CURETTAGE/HYSTEROSCOPY WITH NOVASURE ABLATION;  Surgeon: [name], MD;  Location: [place];  Service: Gynecology;  Laterality: N/A;   EXCISIONAL LEFT BREAST BX  11-14-2007   benign   LAPAROSCOPIC TUBAL LIGATION Bilateral 08/26/2015   Procedure: LAPAROSCOPIC TUBAL LIGATION;  Surgeon: [name], MD;  Location: [place];  Service: Gynecology;  Laterality: Bilateral;   SHOULDER ARTHROSCOPY WITH ROTATOR CUFF REPAIR AND SUBACROMIAL DECOMPRESSION Right 04/08/2020   Procedure: SHOULDER ARTHROSCOPY WITH ROTATOR CUFF REPAIR AND SUBACROMIAL DECOMPRESSION;  Surgeon: Tania Ade, MD;  Location: [facility];   Service: Orthopedics;  Laterality: Right;  NEED 90 MINUTES FOR THIS CASE   THYROID LOBECTOMY Right 04-30-2008    OB/GYN HISTORY:  OB History  Gravida Para Term Preterm AB Living  0 0 0 0 0 0  SAB IAB Ectopic Multiple Live Births  0 0 0 0 0    No LMP recorded. Patient has had an ablation.  Age at menarche: 43 Age at menopause: 45 Hx of HRT: Denies Hx of STDs: Yes, recently treated for trichomonas Last pap: 01/2020: Negative, benign reactive changes noted.  Satisfactory for evaluation, endocervical and transformation zone component absent.  High risk HPV negative. History of abnormal pap smears: yes  SCREENING STUDIES:  Last mammogram: 2020  Last colonoscopy: 2019 Last bone mineral density:  2018  MEDICATIONS: Outpatient Encounter Medications as of 12/13/2020  Medication Sig   amLODipine (NORVASC) 5 MG tablet TAKE 1 TABLET (5 MG TOTAL) BY MOUTH DAILY.   carvedilol (COREG) 6.25 MG tablet TAKE 1 TABLET BY MOUTH 2 TIMES DAILY WITH A MEAL. (Patient taking differently: Take 6.25 mg by mouth daily.)   hydrochlorothiazide (HYDRODIURIL) 25 MG tablet TAKE 1 TABLET (25 MG TOTAL) BY MOUTH DAILY.   metFORMIN (GLUCOPHAGE) 500 MG tablet Take 1 tablet (500 mg total) by mouth daily.   olmesartan (BENICAR) 40 MG tablet TAKE 1 TABLET (40 MG TOTAL) BY MOUTH DAILY.   oxyCODONE (OXY IR/ROXICODONE) 5 MG immediate release tablet Take 1 tablet (5 mg total) by mouth every 6 (six) hours as needed for up to 20 doses for severe pain.   rosuvastatin (CRESTOR) 10 MG tablet TAKE 1 TABLET (10 MG TOTAL) BY MOUTH DAILY.   [DISCONTINUED] oxycodone (OXY-IR) 5 MG capsule Take 1 capsule (5 mg total) by mouth every 6 (six) hours as needed for up to 20 doses.   buPROPion (WELLBUTRIN SR) 200 MG 12 hr tablet Take 1 tablet (200 mg total) by mouth every morning. (Patient not taking: Reported on 12/12/2020)   dexamethasone (DECADRON) 1 MG tablet Take 1 tablet (1 mg total) by mouth at bedtime at 11PM prior to 8AM cortisol lab  draw (Patient not taking: Reported on 12/12/2020)   glycopyrrolate (ROBINUL) 1 MG tablet Take 1 tablet by mouth daily. (Patient not taking: Reported on 12/12/2020)   HYDROcodone-acetaminophen (NORCO/VICODIN) 5-325 MG tablet Take 1 tablet by mouth every 4 (four) hours as needed. (Patient not taking: Reported on 12/12/2020)   metroNIDAZOLE (FLAGYL) 500 MG tablet Take 1 tablet (500 mg total) by mouth 2 (two) times daily. One po bid x 7 days (Patient not taking: Reported on 12/12/2020)   oxyCODONE-acetaminophen (PERCOCET/ROXICET) 5-325 MG tablet Take 1 tablet by mouth every 6 (six) hours as needed for severe pain. (Patient not taking: Reported on 12/12/2020)   phenazopyridine (PYRIDIUM) 200 MG tablet Take 1 tablet (200 mg total) by mouth 3 (three) times daily as needed for pain. (Patient not taking: Reported on 12/12/2020)   Semaglutide, 1 MG/DOSE, (OZEMPIC, 1 MG/DOSE,) 4 MG/3ML SOPN Inject 1 mg into the skin once a week. (Patient not taking: Reported on 12/12/2020)   traMADol (ULTRAM) 50 MG tablet Take 1 tablet (50 mg total) by mouth every 6 (six) hours for 5 days (Patient not taking: Reported on 12/12/2020)   Vitamin D, Ergocalciferol, (DRISDOL) 1.25 MG (50000 UNIT) CAPS capsule Take 1 capsule (50,000 Units total) by mouth every 7 (seven) days. (Patient not taking: Reported on 12/12/2020)   No facility-administered encounter medications on file as of 12/13/2020.    ALLERGIES:  Allergies  Allergen Reactions   Tramadol Other (See Comments)    Per patient increased HR and sweating     FAMILY HISTORY:  Family History  Problem Relation Age of Onset   Stroke Mother    Diabetes Mother    Hypertension Mother    Obesity Mother    Glaucoma Father    Alcoholism Father    Heart disease Paternal Grandmother        enlarged heart   Diabetes Half-Sister    Hypertension Half-Sister    Colon cancer Neg Hx    Esophageal cancer Neg Hx    Stomach cancer Neg Hx    Colon polyps Neg Hx    Rectal cancer Neg Hx     Breast cancer Neg Hx  Ovarian cancer Neg Hx    Endometrial cancer Neg Hx    Pancreatic cancer Neg Hx    Prostate cancer Neg Hx      SOCIAL HISTORY:  Social Connections: Not on file    REVIEW OF SYSTEMS:  Pertinent positives as per HPI.  Patient also notes fatigue, hot flashes, difficulty walking, vaginal discharge. Denies appetite changes, fevers, chills. Denies hearing loss, neck lumps or masses, mouth sores, ringing in ears or voice changes. Denies cough or wheezing.  Denies shortness of breath. Denies chest pain or palpitations. Denies leg swelling. Denies abdominal distention, blood in stools, constipation, diarrhea, nausea, vomiting, or early satiety. Denies pain with intercourse, frequency, hematuria or incontinence. Denies vaginal bleeding.   Denies joint pain, back pain or muscle pain/cramps. Denies itching, rash, or wounds. Denies dizziness, headaches, numbness or seizures. Denies swollen lymph nodes or glands, denies easy bruising or bleeding. Denies anxiety, depression, confusion, or decreased concentration.  Physical Exam:  Vital Signs for this encounter:  Blood pressure (!) 152/87, pulse (!) 102, temperature 98.4 F (36.9 C), temperature source Oral, resp. rate 18, height 5\' 4"  (1.626 m), weight 280 lb (127 kg), SpO2 95 %. Body mass index is 48.06 kg/m. General: Alert, oriented, no acute distress.  HEENT: Normocephalic, atraumatic. Sclera anicteric.  Chest: Clear to auscultation bilaterally. No wheezes, rhonchi, or rales. Cardiovascular: Regular rate and rhythm, no murmurs, rubs, or gallops.  Abdomen: Obese. Normoactive bowel sounds. Soft, nondistended, nontender to palpation. No masses or hepatosplenomegaly appreciated. No palpable fluid wave.  Extremities: Grossly normal range of motion. Warm, well perfused. No edema bilaterally.  Skin: No rashes or lesions.  Lymphatics: No palpable cervical, supraclavicular, or inguinal adenopathy.  GU: Normal-appearing  external female genitalia with catheter in place with dark concentrated urine in her bag.  Speculum exam is quite difficult.  I was able to advance the speculum approximately 5 cm but then there is narrowing towards the apex of the vagina that limits any direct visualization of the cervix.  No discharge or bleeding noted.  On bimanual exam, almost the entire length of the anterior vagina is firm and fixed.  On palpation, the cervix itself is smooth with no obvious nodularity.  There is a gap between the cervix and the anterior vagina where firmness begins.  On rectovaginal exam, there is a 2 cm smooth mass appreciated within the parametria/cervix junction on the left.  Unable to appreciate adnexal mass within the cul-de-sac on exam.  LABORATORY AND RADIOLOGIC DATA:  Outside medical records were reviewed to synthesize the above history, along with the history and physical obtained during the visit.   Lab Results  Component Value Date   WBC 14.8 (H) 12/05/2020   HGB 12.4 12/05/2020   HCT 38.0 12/05/2020   PLT 291 12/05/2020   GLUCOSE 165 (H) 12/05/2020   CHOL 228 (H) 03/08/2020   TRIG 70 03/08/2020   HDL 52 03/08/2020   LDLCALC 164 (H) 03/08/2020   ALT 58 (H) 12/05/2020   AST 73 (H) 12/05/2020   NA 133 (L) 12/05/2020   K 2.8 (L) 12/05/2020   CL 96 (L) 12/05/2020   CREATININE 0.82 12/05/2020   BUN 18 12/05/2020   CO2 26 12/05/2020   TSH 2.320 03/08/2020   INR 1.0 04/26/2008   HGBA1C 6.7 (H) 04/04/2020

## 2020-12-14 LAB — CA 125: Cancer Antigen (CA) 125: 29.2 U/mL (ref 0.0–38.1)

## 2020-12-16 ENCOUNTER — Inpatient Hospital Stay (HOSPITAL_COMMUNITY)
Admission: EM | Admit: 2020-12-16 | Discharge: 2021-01-14 | DRG: 357 | Disposition: A | Discharge status: Home Health | Payer: Self-pay | Attending: Internal Medicine | Admitting: Internal Medicine

## 2020-12-16 ENCOUNTER — Emergency Department (HOSPITAL_COMMUNITY): Payer: Self-pay

## 2020-12-16 ENCOUNTER — Inpatient Hospital Stay (HOSPITAL_COMMUNITY): Payer: Self-pay

## 2020-12-16 ENCOUNTER — Telehealth: Payer: Self-pay | Admitting: *Deleted

## 2020-12-16 ENCOUNTER — Encounter (HOSPITAL_COMMUNITY): Payer: Self-pay | Admitting: Family Medicine

## 2020-12-16 DIAGNOSIS — Z794 Long term (current) use of insulin: Secondary | ICD-10-CM

## 2020-12-16 DIAGNOSIS — Z20822 Contact with and (suspected) exposure to covid-19: Secondary | ICD-10-CM | POA: Diagnosis present

## 2020-12-16 DIAGNOSIS — K635 Polyp of colon: Secondary | ICD-10-CM | POA: Diagnosis present

## 2020-12-16 DIAGNOSIS — A419 Sepsis, unspecified organism: Secondary | ICD-10-CM

## 2020-12-16 DIAGNOSIS — E119 Type 2 diabetes mellitus without complications: Secondary | ICD-10-CM

## 2020-12-16 DIAGNOSIS — G4733 Obstructive sleep apnea (adult) (pediatric): Secondary | ICD-10-CM

## 2020-12-16 DIAGNOSIS — R1030 Lower abdominal pain, unspecified: Secondary | ICD-10-CM

## 2020-12-16 DIAGNOSIS — C7951 Secondary malignant neoplasm of bone: Secondary | ICD-10-CM | POA: Diagnosis present

## 2020-12-16 DIAGNOSIS — I10 Essential (primary) hypertension: Secondary | ICD-10-CM

## 2020-12-16 DIAGNOSIS — Z6841 Body Mass Index (BMI) 40.0 and over, adult: Secondary | ICD-10-CM

## 2020-12-16 DIAGNOSIS — C799 Secondary malignant neoplasm of unspecified site: Secondary | ICD-10-CM

## 2020-12-16 DIAGNOSIS — I700 Atherosclerosis of aorta: Secondary | ICD-10-CM | POA: Diagnosis present

## 2020-12-16 DIAGNOSIS — Z87891 Personal history of nicotine dependence: Secondary | ICD-10-CM

## 2020-12-16 DIAGNOSIS — C801 Malignant (primary) neoplasm, unspecified: Secondary | ICD-10-CM

## 2020-12-16 DIAGNOSIS — Z79899 Other long term (current) drug therapy: Secondary | ICD-10-CM

## 2020-12-16 DIAGNOSIS — F32A Depression, unspecified: Secondary | ICD-10-CM | POA: Diagnosis present

## 2020-12-16 DIAGNOSIS — E876 Hypokalemia: Secondary | ICD-10-CM

## 2020-12-16 DIAGNOSIS — D649 Anemia, unspecified: Secondary | ICD-10-CM

## 2020-12-16 DIAGNOSIS — K2970 Gastritis, unspecified, without bleeding: Secondary | ICD-10-CM

## 2020-12-16 DIAGNOSIS — N390 Urinary tract infection, site not specified: Secondary | ICD-10-CM

## 2020-12-16 DIAGNOSIS — K5903 Drug induced constipation: Secondary | ICD-10-CM | POA: Diagnosis present

## 2020-12-16 DIAGNOSIS — R109 Unspecified abdominal pain: Secondary | ICD-10-CM | POA: Diagnosis present

## 2020-12-16 DIAGNOSIS — E662 Morbid (severe) obesity with alveolar hypoventilation: Secondary | ICD-10-CM | POA: Diagnosis present

## 2020-12-16 DIAGNOSIS — E440 Moderate protein-calorie malnutrition: Secondary | ICD-10-CM | POA: Insufficient documentation

## 2020-12-16 DIAGNOSIS — Z8559 Personal history of malignant neoplasm of other urinary tract organ: Secondary | ICD-10-CM

## 2020-12-16 DIAGNOSIS — B954 Other streptococcus as the cause of diseases classified elsewhere: Secondary | ICD-10-CM | POA: Diagnosis present

## 2020-12-16 DIAGNOSIS — R509 Fever, unspecified: Secondary | ICD-10-CM

## 2020-12-16 DIAGNOSIS — E785 Hyperlipidemia, unspecified: Secondary | ICD-10-CM | POA: Diagnosis present

## 2020-12-16 DIAGNOSIS — Z833 Family history of diabetes mellitus: Secondary | ICD-10-CM

## 2020-12-16 DIAGNOSIS — Z79891 Long term (current) use of opiate analgesic: Secondary | ICD-10-CM

## 2020-12-16 DIAGNOSIS — R970 Elevated carcinoembryonic antigen [CEA]: Secondary | ICD-10-CM

## 2020-12-16 DIAGNOSIS — R6520 Severe sepsis without septic shock: Secondary | ICD-10-CM

## 2020-12-16 DIAGNOSIS — I119 Hypertensive heart disease without heart failure: Secondary | ICD-10-CM | POA: Diagnosis present

## 2020-12-16 DIAGNOSIS — F419 Anxiety disorder, unspecified: Secondary | ICD-10-CM | POA: Diagnosis present

## 2020-12-16 DIAGNOSIS — G894 Chronic pain syndrome: Secondary | ICD-10-CM | POA: Diagnosis present

## 2020-12-16 DIAGNOSIS — K5900 Constipation, unspecified: Secondary | ICD-10-CM

## 2020-12-16 DIAGNOSIS — C7963 Secondary malignant neoplasm of bilateral ovaries: Secondary | ICD-10-CM | POA: Diagnosis present

## 2020-12-16 DIAGNOSIS — N838 Other noninflammatory disorders of ovary, fallopian tube and broad ligament: Secondary | ICD-10-CM

## 2020-12-16 DIAGNOSIS — F410 Panic disorder [episodic paroxysmal anxiety] without agoraphobia: Secondary | ICD-10-CM

## 2020-12-16 DIAGNOSIS — K219 Gastro-esophageal reflux disease without esophagitis: Secondary | ICD-10-CM | POA: Diagnosis present

## 2020-12-16 DIAGNOSIS — Z7984 Long term (current) use of oral hypoglycemic drugs: Secondary | ICD-10-CM

## 2020-12-16 DIAGNOSIS — K5730 Diverticulosis of large intestine without perforation or abscess without bleeding: Secondary | ICD-10-CM | POA: Diagnosis present

## 2020-12-16 DIAGNOSIS — E559 Vitamin D deficiency, unspecified: Secondary | ICD-10-CM | POA: Diagnosis present

## 2020-12-16 DIAGNOSIS — Z8249 Family history of ischemic heart disease and other diseases of the circulatory system: Secondary | ICD-10-CM

## 2020-12-16 DIAGNOSIS — R591 Generalized enlarged lymph nodes: Secondary | ICD-10-CM | POA: Diagnosis present

## 2020-12-16 DIAGNOSIS — R300 Dysuria: Secondary | ICD-10-CM

## 2020-12-16 DIAGNOSIS — K5909 Other constipation: Secondary | ICD-10-CM | POA: Diagnosis present

## 2020-12-16 DIAGNOSIS — T402X5A Adverse effect of other opioids, initial encounter: Secondary | ICD-10-CM | POA: Diagnosis present

## 2020-12-16 DIAGNOSIS — G893 Neoplasm related pain (acute) (chronic): Secondary | ICD-10-CM

## 2020-12-16 DIAGNOSIS — D72829 Elevated white blood cell count, unspecified: Secondary | ICD-10-CM | POA: Diagnosis present

## 2020-12-16 DIAGNOSIS — C7911 Secondary malignant neoplasm of bladder: Secondary | ICD-10-CM | POA: Diagnosis present

## 2020-12-16 DIAGNOSIS — C169 Malignant neoplasm of stomach, unspecified: Principal | ICD-10-CM

## 2020-12-16 DIAGNOSIS — D62 Acute posthemorrhagic anemia: Secondary | ICD-10-CM | POA: Diagnosis not present

## 2020-12-16 DIAGNOSIS — E890 Postprocedural hypothyroidism: Secondary | ICD-10-CM | POA: Diagnosis present

## 2020-12-16 DIAGNOSIS — C7982 Secondary malignant neoplasm of genital organs: Secondary | ICD-10-CM | POA: Diagnosis present

## 2020-12-16 DIAGNOSIS — N368 Other specified disorders of urethra: Secondary | ICD-10-CM

## 2020-12-16 DIAGNOSIS — Z885 Allergy status to narcotic agent status: Secondary | ICD-10-CM

## 2020-12-16 LAB — CBC WITH DIFFERENTIAL/PLATELET
Abs Immature Granulocytes: 0.63 10*3/uL — ABNORMAL HIGH (ref 0.00–0.07)
Basophils Absolute: 0.1 10*3/uL (ref 0.0–0.1)
Basophils Relative: 0 %
Eosinophils Absolute: 0 10*3/uL (ref 0.0–0.5)
Eosinophils Relative: 0 %
HCT: 31.8 % — ABNORMAL LOW (ref 36.0–46.0)
Hemoglobin: 10.7 g/dL — ABNORMAL LOW (ref 12.0–15.0)
Immature Granulocytes: 5 %
Lymphocytes Relative: 8 %
Lymphs Abs: 1.1 10*3/uL (ref 0.7–4.0)
MCH: 30 pg (ref 26.0–34.0)
MCHC: 33.6 g/dL (ref 30.0–36.0)
MCV: 89.1 fL (ref 80.0–100.0)
Monocytes Absolute: 0.7 10*3/uL (ref 0.1–1.0)
Monocytes Relative: 5 %
Neutro Abs: 11.1 10*3/uL — ABNORMAL HIGH (ref 1.7–7.7)
Neutrophils Relative %: 82 %
Platelets: 258 10*3/uL (ref 150–400)
RBC: 3.57 MIL/uL — ABNORMAL LOW (ref 3.87–5.11)
RDW: 13.6 % (ref 11.5–15.5)
WBC: 13.6 10*3/uL — ABNORMAL HIGH (ref 4.0–10.5)
nRBC: 0.2 % (ref 0.0–0.2)

## 2020-12-16 LAB — COMPREHENSIVE METABOLIC PANEL
ALT: 22 U/L (ref 0–44)
AST: 42 U/L — ABNORMAL HIGH (ref 15–41)
Albumin: 2.4 g/dL — ABNORMAL LOW (ref 3.5–5.0)
Alkaline Phosphatase: 914 U/L — ABNORMAL HIGH (ref 38–126)
Anion gap: 11 (ref 5–15)
BUN: 16 mg/dL (ref 6–20)
CO2: 26 mmol/L (ref 22–32)
Calcium: 9.2 mg/dL (ref 8.9–10.3)
Chloride: 94 mmol/L — ABNORMAL LOW (ref 98–111)
Creatinine, Ser: 0.78 mg/dL (ref 0.44–1.00)
GFR, Estimated: >60 mL/min (ref >60)
Glucose, Bld: 151 mg/dL — ABNORMAL HIGH (ref 70–99)
Potassium: 3.1 mmol/L — ABNORMAL LOW (ref 3.5–5.1)
Sodium: 131 mmol/L — ABNORMAL LOW (ref 135–145)
Total Bilirubin: 1.2 mg/dL (ref 0.3–1.2)
Total Protein: 6.7 g/dL (ref 6.5–8.1)

## 2020-12-16 LAB — RESP PANEL BY RT-PCR (FLU A&B, COVID) ARPGX2
Influenza A by PCR: NEGATIVE
Influenza B by PCR: NEGATIVE
SARS Coronavirus 2 by RT PCR: NEGATIVE

## 2020-12-16 LAB — URINALYSIS, ROUTINE W REFLEX MICROSCOPIC
Bilirubin Urine: NEGATIVE
Glucose, UA: NEGATIVE mg/dL
Ketones, ur: NEGATIVE mg/dL
Nitrite: NEGATIVE
Protein, ur: 30 mg/dL — AB
Specific Gravity, Urine: 1.016 (ref 1.005–1.030)
pH: 6 (ref 5.0–8.0)

## 2020-12-16 LAB — MAGNESIUM: Magnesium: 2.5 mg/dL — ABNORMAL HIGH (ref 1.7–2.4)

## 2020-12-16 LAB — CK: Total CK: 212 U/L (ref 38–234)

## 2020-12-16 LAB — LACTIC ACID, PLASMA: Lactic Acid, Venous: 1.2 mmol/L (ref 0.5–1.9)

## 2020-12-16 MED ORDER — HYDROMORPHONE HCL 1 MG/ML IJ SOLN
1.0000 mg | Freq: Once | INTRAMUSCULAR | Status: AC
  Administered 2020-12-16: 1 mg via INTRAVENOUS
  Filled 2020-12-16: qty 1

## 2020-12-16 MED ORDER — INSULIN ASPART 100 UNIT/ML IJ SOLN
0.0000 [IU] | Freq: Three times a day (TID) | INTRAMUSCULAR | Status: DC
  Administered 2020-12-17 – 2020-12-18 (×2): 2 [IU] via SUBCUTANEOUS
  Administered 2020-12-18 – 2020-12-19 (×3): 1 [IU] via SUBCUTANEOUS
  Administered 2020-12-19: 2 [IU] via SUBCUTANEOUS
  Administered 2020-12-19 – 2020-12-21 (×6): 1 [IU] via SUBCUTANEOUS
  Administered 2020-12-22: 2 [IU] via SUBCUTANEOUS
  Administered 2020-12-22: 1 [IU] via SUBCUTANEOUS
  Administered 2020-12-23: 3 [IU] via SUBCUTANEOUS
  Administered 2020-12-23 – 2020-12-27 (×7): 1 [IU] via SUBCUTANEOUS
  Administered 2020-12-27: 2 [IU] via SUBCUTANEOUS
  Administered 2020-12-27 – 2020-12-29 (×5): 1 [IU] via SUBCUTANEOUS
  Administered 2020-12-29: 2 [IU] via SUBCUTANEOUS
  Administered 2020-12-29 – 2021-01-03 (×11): 1 [IU] via SUBCUTANEOUS
  Administered 2021-01-03: 2 [IU] via SUBCUTANEOUS
  Administered 2021-01-04 – 2021-01-07 (×7): 1 [IU] via SUBCUTANEOUS
  Administered 2021-01-08 – 2021-01-10 (×3): 2 [IU] via SUBCUTANEOUS
  Administered 2021-01-10: 1 [IU] via SUBCUTANEOUS
  Administered 2021-01-10: 3 [IU] via SUBCUTANEOUS
  Administered 2021-01-11 – 2021-01-14 (×5): 1 [IU] via SUBCUTANEOUS
  Filled 2020-12-16: qty 0.09

## 2020-12-16 MED ORDER — GABAPENTIN 100 MG PO CAPS
100.0000 mg | ORAL_CAPSULE | Freq: Every day | ORAL | Status: DC
  Administered 2020-12-17 – 2020-12-28 (×13): 100 mg via ORAL
  Filled 2020-12-16 (×13): qty 1

## 2020-12-16 MED ORDER — OXYCODONE HCL 5 MG PO TABS
5.0000 mg | ORAL_TABLET | Freq: Four times a day (QID) | ORAL | Status: DC
  Administered 2020-12-16 – 2020-12-17 (×3): 5 mg via ORAL
  Filled 2020-12-16 (×3): qty 1

## 2020-12-16 MED ORDER — POTASSIUM CHLORIDE CRYS ER 20 MEQ PO TBCR
40.0000 meq | EXTENDED_RELEASE_TABLET | Freq: Once | ORAL | Status: AC
  Administered 2020-12-16: 40 meq via ORAL
  Filled 2020-12-16: qty 2

## 2020-12-16 MED ORDER — GADOBUTROL 1 MMOL/ML IV SOLN
10.0000 mL | Freq: Once | INTRAVENOUS | Status: AC | PRN
  Administered 2020-12-16: 10 mL via INTRAVENOUS

## 2020-12-16 MED ORDER — HYDROMORPHONE HCL 1 MG/ML IJ SOLN
1.0000 mg | INTRAMUSCULAR | Status: DC | PRN
  Administered 2020-12-16 – 2020-12-17 (×3): 1 mg via INTRAVENOUS
  Filled 2020-12-16 (×3): qty 1

## 2020-12-16 MED ORDER — IOHEXOL 350 MG/ML SOLN: 60.0000 mL | Freq: Once | INTRAVENOUS | Status: DC | PRN

## 2020-12-16 MED ORDER — MORPHINE SULFATE (PF) 2 MG/ML IV SOLN: 1.0000 mg | INTRAVENOUS | Status: DC | PRN

## 2020-12-16 MED ORDER — IOHEXOL 350 MG/ML SOLN
80.0000 mL | Freq: Once | INTRAVENOUS | Status: AC | PRN
  Administered 2020-12-16: 80 mL via INTRAVENOUS

## 2020-12-16 MED ORDER — ENOXAPARIN SODIUM 60 MG/0.6ML IJ SOSY
60.0000 mg | PREFILLED_SYRINGE | INTRAMUSCULAR | Status: DC
  Administered 2020-12-17 – 2021-01-13 (×28): 60 mg via SUBCUTANEOUS
  Filled 2020-12-16 (×29): qty 0.6

## 2020-12-16 NOTE — ED Provider Notes (Signed)
Schedule [facility] DEPT Provider Note   CSN: [id number] Arrival date & time: 12/16/20  1234     History Chief Complaint  Patient presents with   Leg Pain   Abdominal Pain    Erin [name] is a 55 y.o. female with a past medical history significant for hyperlipidemia, hypertension, OSA on CPAP, type 2 diabetes, and new diagnosis of metastatic cancer of unknown etiology who presents to the ED due to lower abdominal and bilateral lower extremity pain that has progressively worsened over the past few days. Patient describes pain as a burning sensation. She notes she was unable to ambulate today due to weakness of bilateral lower extremities and pain. She endorses pain to her bilateral buttocks that radiates down her legs.  Denies fever and chills.  Denies nausea, vomiting, diarrhea.  Patient admits to some constipation due to opioids.  She is currently taking oxycodone with no relief of pain.  Denies any fecal incontinence.  Chart reviewed.  Patient had a recent office visit with Dr. Berline Lopes with GYN oncology on 11/4.  MRI images concerning for metastatic cancer including urethral mass, bilateral ovarian masses, and evidence of bony metastasis.  Patient has had a urinary catheter in place since her previous ED visit on 10/27.  Patient has an upcoming urology appointment scheduled.  History obtained from patient and past medical records. No interpreter used during encounter.       Past Medical History:  Diagnosis Date   Anxiety    Constipation    Depression    GERD (gastroesophageal reflux disease)    Pt states she no longer needs meds   Hyperlipidemia    Hypertension    Menorrhagia    OSA (obstructive sleep apnea) 11/25/2017   OSA on CPAP    Other fatigue    Shortness of breath on exertion    Type 2 diabetes mellitus ([place])    Vitamin D deficiency    Wears glasses     Patient Active Problem List   Diagnosis Date Noted   Abdominal pain  12/16/2020   Tear of right rotator cuff 04/04/2020   Vitamin D deficiency 04/02/2020   Other fatigue 03/13/2020   Shortness of breath on exertion 03/13/2020   OSA (obstructive sleep apnea) 11/25/2017   Obesity hypoventilation syndrome ([place]) 11/25/2017   Mallet finger of right hand 09/03/2017   Anterolisthesis 04/17/2017   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[place]) 04/17/2017   Degenerative disc disease, lumbar 04/17/2017   Class 3 severe obesity with serious comorbidity and body mass index (BMI) of 50.0 to 59.9 in adult ([place]) 06/01/2016   Diabetes mellitus ([place]) 05/27/2016   Essential hypertension 04/13/2016   Menorrhagia 08/26/2015    Class: Present on Admission    Past Surgical History:  Procedure Laterality Date   BREAST EXCISIONAL BIOPSY     DILITATION & CURRETTAGE/HYSTROSCOPY WITH NOVASURE ABLATION N/A 08/26/2015   Procedure: DILATATION & CURETTAGE/HYSTEROSCOPY WITH NOVASURE ABLATION;  Surgeon: [name], MD;  Location: [facility];  Service: Gynecology;  Laterality: N/A;   EXCISIONAL LEFT BREAST BX  11-14-2007   benign   LAPAROSCOPIC TUBAL LIGATION Bilateral 08/26/2015   Procedure: LAPAROSCOPIC TUBAL LIGATION;  Surgeon: [name], MD;  Location: [facility];  Service: Gynecology;  Laterality: Bilateral;   SHOULDER ARTHROSCOPY WITH ROTATOR CUFF REPAIR AND SUBACROMIAL DECOMPRESSION Right 04/08/2020   Procedure: SHOULDER ARTHROSCOPY WITH ROTATOR CUFF REPAIR AND SUBACROMIAL DECOMPRESSION;  Surgeon: Tania Ade, MD;  Location: [facility];  Service: Orthopedics;  Laterality: Right;  NEED 90 MINUTES FOR THIS CASE   THYROID LOBECTOMY Right 04-30-2008     OB History     Gravida  0   Para  0   Term  0   Preterm  0   AB  0   Living  0      SAB  0   IAB  0   Ectopic  0   Multiple  0   Live Births  0           Family History  Problem Relation Age of Onset   Stroke Mother    Diabetes Mother    Hypertension Mother    Obesity Mother     Glaucoma Father    Alcoholism Father    Heart disease Paternal Grandmother        enlarged heart   Diabetes Half-Sister    Hypertension Half-Sister    Colon cancer Neg Hx    Esophageal cancer Neg Hx    Stomach cancer Neg Hx    Colon polyps Neg Hx    Rectal cancer Neg Hx    Breast cancer Neg Hx    Ovarian cancer Neg Hx    Endometrial cancer Neg Hx    Pancreatic cancer Neg Hx    Prostate cancer Neg Hx     Social History   Tobacco Use   Smoking status: Former    Years: 15.00    Types: Cigarettes    Quit date: 2001    Years since quitting: 21.8   Smokeless tobacco: Never  Vaping Use   Vaping Use: Never used  Substance Use Topics   Alcohol use: No   Drug use: Yes    Types: Marijuana    Home Medications Prior to Admission medications   Medication Sig Start Date End Date Taking? Authorizing Provider  amLODipine (NORVASC) 5 MG tablet TAKE 1 TABLET (5 MG TOTAL) BY MOUTH DAILY. 03/06/20 03/06/21  Fay Records, MD  buPROPion (WELLBUTRIN SR) 200 MG 12 hr tablet Take 1 tablet (200 mg total) by mouth every morning. Patient not taking: Reported on 12/12/2020 10/10/20   Dennard Nip D, MD  carvedilol (COREG) 6.25 MG tablet TAKE 1 TABLET BY MOUTH 2 TIMES DAILY WITH A MEAL. Patient taking differently: Take 6.25 mg by mouth daily. 03/06/20 03/06/21  Fay Records, MD  dexamethasone (DECADRON) 1 MG tablet Take 1 tablet (1 mg total) by mouth at bedtime at 11PM prior to 8AM cortisol lab draw Patient not taking: Reported on 12/12/2020 11/18/20     glycopyrrolate (ROBINUL) 1 MG tablet Take 1 tablet by mouth daily. Patient not taking: Reported on 12/12/2020 06/16/17   [provider]  hydrochlorothiazide (HYDRODIURIL) 25 MG tablet TAKE 1 TABLET (25 MG TOTAL) BY MOUTH DAILY. 03/06/20 03/06/21  Fay Records, MD  HYDROcodone-acetaminophen (NORCO/VICODIN) 5-325 MG tablet Take 1 tablet by mouth every 4 (four) hours as needed. Patient not taking: Reported on 12/12/2020 12/05/20   Isla Pence, MD   metFORMIN (GLUCOPHAGE) 500 MG tablet Take 1 tablet (500 mg total) by mouth daily. 07/10/20     metroNIDAZOLE (FLAGYL) 500 MG tablet Take 1 tablet (500 mg total) by mouth 2 (two) times daily. One po bid x 7 days Patient not taking: Reported on 12/12/2020 12/01/20   Malvin Johns, MD  olmesartan (BENICAR) 40 MG tablet TAKE 1 TABLET (40 MG TOTAL) BY MOUTH DAILY. 03/06/20 03/06/21  Fay Records, MD  oxyCODONE (OXY IR/ROXICODONE) 5 MG immediate release tablet Take 1 tablet (5  mg total) by mouth every 6 (six) hours as needed for up to 20 doses for severe pain. 12/13/20   Lafonda Mosses, MD  oxyCODONE-acetaminophen (PERCOCET/ROXICET) 5-325 MG tablet Take 1 tablet by mouth every 6 (six) hours as needed for severe pain. Patient not taking: Reported on 12/12/2020 12/05/20   Isla Pence, MD  phenazopyridine (PYRIDIUM) 200 MG tablet Take 1 tablet (200 mg total) by mouth 3 (three) times daily as needed for pain. Patient not taking: Reported on 12/12/2020 11/27/20   Caccavale, Sophia, PA-C  rosuvastatin (CRESTOR) 10 MG tablet TAKE 1 TABLET (10 MG TOTAL) BY MOUTH DAILY. 03/06/20 03/06/21  Fay Records, MD  Semaglutide, 1 MG/DOSE, (OZEMPIC, 1 MG/DOSE,) 4 MG/3ML SOPN Inject 1 mg into the skin once a week. Patient not taking: Reported on 12/12/2020 10/10/20   Dennard Nip D, MD  traMADol (ULTRAM) 50 MG tablet Take 1 tablet (50 mg total) by mouth every 6 (six) hours for 5 days Patient not taking: Reported on 12/12/2020 12/11/20     Vitamin D, Ergocalciferol, (DRISDOL) 1.25 MG (50000 UNIT) CAPS capsule Take 1 capsule (50,000 Units total) by mouth every 7 (seven) days. Patient not taking: Reported on 12/12/2020 10/10/20   Dennard Nip D, MD    Allergies    Tramadol  Review of Systems   Review of Systems  Constitutional:  Negative for chills and fever.  Respiratory:  Negative for shortness of breath.   Cardiovascular:  Negative for chest pain.  Gastrointestinal:  Positive for abdominal pain and constipation.  Negative for diarrhea, nausea and vomiting.  Musculoskeletal:  Positive for back pain and gait problem.  Neurological:  Positive for weakness.  All other systems reviewed and are negative.  Physical Exam Updated Vital Signs BP (!) 168/100 (BP Location: Left Arm)   Pulse (!) 106   Temp 99.1 F (37.3 C) (Oral)   Resp 18   Ht _0  (1.626 m)   Wt 127 kg   SpO2 94%   BMI 48.06 kg/m   Physical Exam Vitals and nursing note reviewed.  Constitutional:      General: She is not in acute distress.    Appearance: She is not ill-appearing.  HENT:     Head: Normocephalic.  Eyes:     Pupils: Pupils are equal, round, and reactive to light.  Cardiovascular:     Rate and Rhythm: Normal rate and regular rhythm.     Pulses: Normal pulses.     Heart sounds: Normal heart sounds. No murmur heard.   No friction rub. No gallop.  Pulmonary:     Effort: Pulmonary effort is normal.     Breath sounds: Normal breath sounds.  Abdominal:     General: Abdomen is flat. There is no distension.     Palpations: Abdomen is soft.     Tenderness: There is abdominal tenderness. There is no guarding or rebound.     Comments: Diffuse abdominal tenderness.  Musculoskeletal:        General: Normal range of motion.     Cervical back: Neck supple.     Comments: Patient able to lift bilateral legs slightly off bed.  Skin:    General: Skin is warm and dry.  Neurological:     General: No focal deficit present.     Mental Status: She is alert.  Psychiatric:        Mood and Affect: Mood normal.        Behavior: Behavior normal.    ED Results /  Procedures / Treatments   Labs (all labs ordered are listed, but only abnormal results are displayed) Labs Reviewed  COMPREHENSIVE METABOLIC PANEL - Abnormal; Notable for the following components:      Result Value   Sodium 131 (*)    Potassium 3.1 (*)    Chloride 94 (*)    Glucose, Bld 151 (*)    Albumin 2.4 (*)    AST 42 (*)    Alkaline Phosphatase 914 (*)     All other components within normal limits  CBC WITH DIFFERENTIAL/PLATELET - Abnormal; Notable for the following components:   WBC 13.6 (*)    RBC 3.57 (*)    Hemoglobin 10.7 (*)    HCT 31.8 (*)    Neutro Abs 11.1 (*)    Abs Immature Granulocytes 0.63 (*)    All other components within normal limits  URINALYSIS, ROUTINE W REFLEX MICROSCOPIC - Abnormal; Notable for the following components:   Color, Urine AMBER (*)    APPearance HAZY (*)    Hgb urine dipstick SMALL (*)    Protein, ur 30 (*)    Leukocytes,Ua LARGE (*)    Bacteria, UA RARE (*)    All other components within normal limits  URINE CULTURE  RESP PANEL BY RT-PCR (FLU A&B, COVID) ARPGX2  MAGNESIUM  CK  LACTIC ACID, PLASMA  LACTIC ACID, PLASMA    EKG None  Radiology DG Chest 2 View  Result Date: 12/16/2020 CLINICAL DATA:  Shortness of breath in a 55 year old female. EXAM: CHEST - 2 VIEW COMPARISON:  July 10, 2009. FINDINGS: Linear opacities in the LEFT chest of progressed slightly since the previous study. No lobar consolidation. Cardiomediastinal contours and hilar structures are stable. No sign of effusion. Mildly increased density in the subcarinal region on lateral projection. No acute skeletal process on limited assessment. IMPRESSION: Signs of scarring or atelectasis in the LEFT chest, slightly progressed since the previous study. Question developing subcarinal adenopathy, based on lateral projection. Developing lower lobe airspace disease is also a differential consideration. In a patient with pelvic masses on recent pelvic MRI would consider follow-up chest CT for further evaluation. Electronically Signed   By: Zetta Bills M.D.   On: 12/16/2020 14:13   CT ABDOMEN PELVIS W CONTRAST  Result Date: 12/16/2020 CLINICAL DATA:  A 55 year old female recently diagnosed with ovarian cancer by report presents with lower abdominal pain. EXAM: CT ABDOMEN AND PELVIS WITH CONTRAST TECHNIQUE: Multidetector CT imaging of the abdomen  and pelvis was performed using the standard protocol following bolus administration of intravenous contrast. CONTRAST:  36m OMNIPAQUE IOHEXOL 350 MG/ML SOLN COMPARISON:  Comparison is made with multiple prior studies which were performed recently, most recent comparison is an MRI of the pelvis of December 05, 2020. FINDINGS: Lower chest: Basilar atelectasis. Subcarinal region not well evaluated. No consolidation or effusion at the lung bases. Hepatobiliary: No focal, suspicious hepatic lesion. No pericholecystic stranding. Portal vein is patent. No biliary duct dilation. Pancreas: Pancreas with normal contours. No signs of adjacent inflammation. Spleen: Spleen normal size and contour. Adrenals/Urinary Tract: Well-circumscribed LEFT adrenal lesion, density value of 19 Hounsfield units on previous noncontrast imaging measuring 3.2 x 2.6 cm., relative washout is indeterminate at 30%. Normal RIGHT adrenal. Normal, symmetric enhancement of bilateral kidneys without focal renal lesion. Stomach/Bowel: Small hiatal hernia. No stranding adjacent to the stomach. No sign of small bowel obstruction or acute small bowel process. The appendix is normal. Stool in various parts of the colon without signs of obstruction or  adjacent stranding. Vascular/Lymphatic: Retroperitoneal adenopathy (image 50/2) 14 mm lymph node previously 11 mm along the LEFT common iliac chain. LEFT pelvic sidewall lymph node (image 75/2) 14 mm short axis, previously 13 mm. Similar size of RIGHT pelvic sidewall/external iliac lymph nodes also with little change compared to the most recent comparison imaging. Reproductive: Foley catheter in situ. Uterus and cervix not well at assess nor are the masses within the area of the cervix and urethra seen on previous MRI. Increasing size of LEFT ovary with heterogeneous appearance now measuring 10 x 8.8 cm. When assessed on the study of October 20, 2020 the ovary for reference measured 3.7 x 2.5 cm. RIGHT ovary  with area of enhancement shows enlargement as well but not as pronounced as on previous imaging with 2.8 cm area of enhancement in the RIGHT ovary as the largest discrete area which is similar to the recent MRI evaluation. As compared to the study of October 20, 2020 there is little change in the appearance of the RIGHT ovary aside from this area though there is an increasingly nodular appearance of the ovary in this location. Mass posterior to the LEFT ovary just above the uterus measuring 4.4 x 2.8 cm has enlarged from 2.5 cm. Other: Small volume ascites. Musculoskeletal: Heterogeneous pattern associated with the spine in terms of density raises the question of metastatic disease without discrete measurable focal lesion in the spine. For example a geographic area of sclerosis on image 101/5) in the anterior L3 vertebral body does raise the question of underlying lesion with numerous additional foci, essentially nearly all levels of the spine with some degree of heterogeneity. Another example of this finding is noted on the RIGHT at the T10 level. No acute bone finding or frankly destructive bone process. IMPRESSION: Rapid increase in size of the LEFT ovary with some adjacent ascites. The rapid enlargement is seen in the setting of generalized worsening of masses associated with the LEFT and RIGHT ovary but perhaps more pronounced than other areas in the pelvis. The possibility of torsion associated with an ovarian mass could be considered based on this finding. Would correlate with any worsening abdominal pain and with sonogram as warranted for further assessment. Enlarging masses elsewhere though not to the extent that is seen with the LEFT ovary. Masslike area of the cervix and potentially within a urethral diverticulum not as well seen as on the recent MRI evaluation. Small volume ascites. Well-circumscribed LEFT adrenal lesion measuring 3.2 x 2.6 cm, density value of 19 Hounsfield units on previous  noncontrast imaging. Relative washout is indeterminate at 30%. This may represent an adrenal adenoma. Consider dedicated adrenal protocol CT or attention on PET evaluation if performed. Heterogeneous pattern of subtle sclerosis and lucency in the spine on today's study raising the question of bony metastatic disease in the spine. Findings of increased ovarian size on the LEFT out of proportion other areas were called by telephone at the time of interpretation on 12/16/2020 at 5:44 pm to provider Theodis Blaze , who verbally acknowledged these results. Electronically Signed   By: Zetta Bills M.D.   On: 12/16/2020 17:46    Procedures Procedures   Medications Ordered in ED Medications  HYDROmorphone (DILAUDID) injection 1 mg (has no administration in time range)  potassium chloride SA (KLOR-CON) CR tablet 40 mEq (has no administration in time range)  iohexol (OMNIPAQUE) 350 MG/ML injection 80 mL (80 mLs Intravenous Contrast Given 12/16/20 1713)    ED Course  I have reviewed the triage  vital signs and the nursing notes.  Pertinent labs & imaging results that were available during my care of the patient were reviewed by me and considered in my medical decision making (see chart for details).  Clinical Course as of 12/16/20 1847  Mon Dec 16, 2020  1823 WBC(!): 13.6 [CA]  1823 Hemoglobin(!): 10.7 [CA]  1823 Alkaline Phosphatase(!): 914 [CA]  1823 Potassium(!): 3.1 [CA]  1823 Sodium(!): 131 [CA]    Clinical Course User Index [CA] Suzy Bouchard, PA-C   MDM Rules/Calculators/A&P                           55 year old female presents to the ED due to worsening lower abdominal pain and bilateral lower extremity weakness and pain.  Patient recently diagnosed with metastatic cancer of unknown etiology.  Patient has a PET scan scheduled.  Upon arrival, patient mildly tachycardic at 101 with otherwise reassuring vitals.  Patient nontoxic-appearing.  Physical exam significant for diffuse  abdominal tenderness.  Patient able to slightly lift bilateral lower extremities off bed.  Sensation intact.  CT abdomen and routine labs ordered at triage.  CT abdomen showed concerns about possible torsion, so pelvic ultrasound was ordered.  IV Dilaudid given for pain.  MRI lumbar spine due to bilateral lower extremity weakness to rule out cauda equina and METs to lumbar spine.  CBC significant for leukocytosis at 13.6.  Anemia with hemoglobin 10.7.  CMP significant for elevated alk phos at 914.  Renal function.  Hypokalemia 3.1.  Potassium repleted here in the ED.  UA significant for large leukocytes and rare bacteria.  Urine culture pending. CT abdomen personally reviewed which demonstrates: IMPRESSION:  Rapid increase in size of the LEFT ovary with some adjacent ascites.  The rapid enlargement is seen in the setting of generalized  worsening of masses associated with the LEFT and RIGHT ovary but  perhaps more pronounced than other areas in the pelvis. The  possibility of torsion associated with an ovarian mass could be  considered based on this finding. Would correlate with any worsening  abdominal pain and with sonogram as warranted for further  assessment.     Enlarging masses elsewhere though not to the extent that is seen  with the LEFT ovary.     Masslike area of the cervix and potentially within a urethral  diverticulum not as well seen as on the recent MRI evaluation.     Small volume ascites.     Well-circumscribed LEFT adrenal lesion measuring 3.2 x 2.6 cm,  density value of 19 Hounsfield units on previous noncontrast  imaging. Relative washout is indeterminate at 30%. This may  represent an adrenal adenoma. Consider dedicated adrenal protocol CT  or attention on PET evaluation if performed.     Heterogeneous pattern of subtle sclerosis and lucency in the spine  on today's study raising the question of bony metastatic disease in  the spine.    6:19 PM Discussed with Dr.  Berline Lopes with GYN oncology who does not feel primary is GYN related given workup; however can not say for sure until further work-up. She recommends pain control and admission for further work-up. She is happy to help during admission is needed from a GYN standpoint.   Feel patient will benefit from admission for further evaluation and pain control given her repeat ED visits and worsening symptoms. Will consult hosptialist for admission.   6:40 PM Discussed with Dr. Flossie Buffy with [facility] who agrees to  admit patient for further treatment. COVID test ordered Final Clinical Impression(s) / ED Diagnoses Final diagnoses:  Lower abdominal pain    Rx / DC Orders ED Discharge Orders     None        Karie Kirks 12/16/20 1847    Valarie Merino, MD 12/18/20 [phone]

## 2020-12-16 NOTE — H&P (Signed)
History and Physical    Erin [name] KNL:[id number] DOB: 10/27/1965 DOA: 12/16/2020  PCP: Pcp, No  Patient coming from: Home  I have personally briefly reviewed patient's old medical records in [place]  Chief Complaint: worsening abdominal and LE pain  HPI: Erin [name] is a 55 y.o. female with medical history significant for OSA on CPAP, hypertension, type 2 diabetes, morbid obesity, recent diagnosis of ovarian and urethral mass who presents with worsening lower abdominal and lower extremity pain.  Patient reports that she has had on and off lower abdominal pain and was recently prescribed oxycodone several days ago by [facility].  Has been taking oxycodone immediate release 5 mg every 6 hours with minimal relief.  She is also been having severe pain down both of her legs with numbness and tingling.  Has difficulty moving legs due to pain.  Notes at least a 13 pound weight loss in the past several weeks.  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 Also was given indwelling urinary catheter at that time for retention. Has follow up with urology on 11/15.  She follows with Dr. Berline Lopes with [facility] who questions whether her primary source is GYN vs urothelial malignancy. GYN has planned for a PET scan this Friday and is coordinating with urology to evaluate her under anesthesia during her cystoscopy and biopsy. If work-up does not reveal primary diagnosis at that time, she may need diagnostic procedure with resection of one or both both ovarian mass and other biopsies to help with diagnosis.  ED Course: She had a temperature of 99.1 Fahrenheit, tachycardic with heart rate of 106, blood pressure of 169/98 and stable on room air. WBC of 13.6, hemoglobin of 10.7, BG of 258 Sodium of 131, K of 3.1, creatinine of 0.78, BG of  151  UA positive for protein with large leuk, negative nitrite and rare bacteria.  She does have chronic indwelling catheter and denies any new urinary symptoms.  Chest x-ray has questionable developing subcarinal adenopathy and recommends further evaluation.  CT of the abdomen/pelvis showing rapid increase in size of left ovarian mass with some ascites along with lucency in the spine concerning for bony metastasis.  Given her chronic indwelling catheter, only a transabdominal ultrasound the pelvis could be obtained which showed bilateral solid adnexal mass essentially replacing the ovaries and that documented flow and then Is more related to her solid masses.  ED PA discussed findings with Dr. Berline Lopes who recommends further work-up and pain control.  She does not feel that primary sources is GYN related.   Review of Systems: Constitutional: No Weight Change, No Fever ENT/Mouth: No sore throat, No Rhinorrhea Eyes: No Eye Pain, No Vision Changes Cardiovascular: No Chest Pain, no SOB Respiratory: No Cough, No Sputum Gastrointestinal: No Nausea, No Vomiting, No Diarrhea, No Constipation, + Pain Genitourinary: no Urinary Incontinence, No Urgency, No Flank Pain Musculoskeletal: No Arthralgias, No Myalgias Skin: No Skin Lesions, No Pruritus, Neuro: no Weakness, No Numbness Psych: No Anxiety/Panic, No Depression, + decrease appetite Heme/Lymph: No Bruising, No Bleeding  Past Medical History:  Diagnosis Date   Anxiety    Constipation    Depression    GERD (gastroesophageal reflux disease)    Pt states she no longer needs meds   Hyperlipidemia    Hypertension    Menorrhagia    OSA (obstructive  sleep apnea) 11/25/2017   OSA on CPAP    Other fatigue    Shortness of breath on exertion    Type 2 diabetes mellitus ([place])    Vitamin D deficiency    Wears glasses     Past Surgical History:  Procedure Laterality Date   BREAST EXCISIONAL BIOPSY     DILITATION & CURRETTAGE/HYSTROSCOPY  WITH NOVASURE ABLATION N/A 08/26/2015   Procedure: DILATATION & CURETTAGE/HYSTEROSCOPY WITH NOVASURE ABLATION;  Surgeon: [name], MD;  Location: [facility];  Service: Gynecology;  Laterality: N/A;   EXCISIONAL LEFT BREAST BX  11-14-2007   benign   LAPAROSCOPIC TUBAL LIGATION Bilateral 08/26/2015   Procedure: LAPAROSCOPIC TUBAL LIGATION;  Surgeon: [name], MD;  Location: [facility];  Service: Gynecology;  Laterality: Bilateral;   SHOULDER ARTHROSCOPY WITH ROTATOR CUFF REPAIR AND SUBACROMIAL DECOMPRESSION Right 04/08/2020   Procedure: SHOULDER ARTHROSCOPY WITH ROTATOR CUFF REPAIR AND SUBACROMIAL DECOMPRESSION;  Surgeon: Tania Ade, MD;  Location: [facility];  Service: Orthopedics;  Laterality: Right;  NEED 90 MINUTES FOR THIS CASE   THYROID LOBECTOMY Right 04-30-2008     reports that she quit smoking about 21 years ago. Her smoking use included cigarettes. She has never used smokeless tobacco. She reports current drug use. Drug: Marijuana. She reports that she does not drink alcohol. Social History  Allergies  Allergen Reactions   Tramadol Other (See Comments)    Per patient increased HR and sweating    Family History  Problem Relation Age of Onset   Stroke Mother    Diabetes Mother    Hypertension Mother    Obesity Mother    Glaucoma Father    Alcoholism Father    Heart disease Paternal Grandmother        enlarged heart   Diabetes Half-Sister    Hypertension Half-Sister    Colon cancer Neg Hx    Esophageal cancer Neg Hx    Stomach cancer Neg Hx    Colon polyps Neg Hx    Rectal cancer Neg Hx    Breast cancer Neg Hx    Ovarian cancer Neg Hx    Endometrial cancer Neg Hx    Pancreatic cancer Neg Hx    Prostate cancer Neg Hx      Prior to Admission medications   Medication Sig Start Date End Date Taking? Authorizing Provider  amLODipine (NORVASC) 5 MG tablet TAKE 1 TABLET (5 MG TOTAL) BY MOUTH DAILY. 03/06/20 03/06/21  Fay Records, MD   buPROPion (WELLBUTRIN SR) 200 MG 12 hr tablet Take 1 tablet (200 mg total) by mouth every morning. Patient not taking: Reported on 12/12/2020 10/10/20   Dennard Nip D, MD  carvedilol (COREG) 6.25 MG tablet TAKE 1 TABLET BY MOUTH 2 TIMES DAILY WITH A MEAL. Patient taking differently: Take 6.25 mg by mouth daily. 03/06/20 03/06/21  Fay Records, MD  dexamethasone (DECADRON) 1 MG tablet Take 1 tablet (1 mg total) by mouth at bedtime at 11PM prior to 8AM cortisol lab draw Patient not taking: Reported on 12/12/2020 11/18/20     glycopyrrolate (ROBINUL) 1 MG tablet Take 1 tablet by mouth daily. Patient not taking: Reported on 12/12/2020 06/16/17   [provider]  hydrochlorothiazide (HYDRODIURIL) 25 MG tablet TAKE 1 TABLET (25 MG TOTAL) BY MOUTH DAILY. 03/06/20 03/06/21  Fay Records, MD  HYDROcodone-acetaminophen (NORCO/VICODIN) 5-325 MG tablet Take 1 tablet by mouth every 4 (four) hours as needed. Patient not taking: Reported on 12/12/2020 12/05/20   Gilford Raid,  Almyra Free, MD  metFORMIN (GLUCOPHAGE) 500 MG tablet Take 1 tablet (500 mg total) by mouth daily. 07/10/20     metroNIDAZOLE (FLAGYL) 500 MG tablet Take 1 tablet (500 mg total) by mouth 2 (two) times daily. One po bid x 7 days Patient not taking: Reported on 12/12/2020 12/01/20   Malvin Johns, MD  olmesartan (BENICAR) 40 MG tablet TAKE 1 TABLET (40 MG TOTAL) BY MOUTH DAILY. 03/06/20 03/06/21  Fay Records, MD  oxyCODONE (OXY IR/ROXICODONE) 5 MG immediate release tablet Take 1 tablet (5 mg total) by mouth every 6 (six) hours as needed for up to 20 doses for severe pain. 12/13/20   Lafonda Mosses, MD  oxyCODONE-acetaminophen (PERCOCET/ROXICET) 5-325 MG tablet Take 1 tablet by mouth every 6 (six) hours as needed for severe pain. Patient not taking: Reported on 12/12/2020 12/05/20   Isla Pence, MD  phenazopyridine (PYRIDIUM) 200 MG tablet Take 1 tablet (200 mg total) by mouth 3 (three) times daily as needed for pain. Patient not taking:  Reported on 12/12/2020 11/27/20   Caccavale, Sophia, PA-C  rosuvastatin (CRESTOR) 10 MG tablet TAKE 1 TABLET (10 MG TOTAL) BY MOUTH DAILY. 03/06/20 03/06/21  Fay Records, MD  Semaglutide, 1 MG/DOSE, (OZEMPIC, 1 MG/DOSE,) 4 MG/3ML SOPN Inject 1 mg into the skin once a week. Patient not taking: Reported on 12/12/2020 10/10/20   Dennard Nip D, MD  traMADol (ULTRAM) 50 MG tablet Take 1 tablet (50 mg total) by mouth every 6 (six) hours for 5 days Patient not taking: Reported on 12/12/2020 12/11/20     Vitamin D, Ergocalciferol, (DRISDOL) 1.25 MG (50000 UNIT) CAPS capsule Take 1 capsule (50,000 Units total) by mouth every 7 (seven) days. Patient not taking: Reported on 12/12/2020 10/10/20   Dennard Nip D, MD    Physical Exam: Vitals:   12/16/20 1256 12/16/20 1307 12/16/20 1740 12/16/20 1837  BP: (!) 151/93  (!) 163/98 (!) 168/100  Pulse: (!) 101  (!) 101 (!) 106  Resp: 18  16 18   Temp: 99.1 F (37.3 C)     TempSrc: Oral     SpO2: 94%  95% 94%  Weight:  127 kg    Height:  5\' 4"  (1.626 m)      Constitutional: NAD, calm, comfortable, morbidly obese female laying flat in bed Vitals:   12/16/20 1256 12/16/20 1307 12/16/20 1740 12/16/20 1837  BP: (!) 151/93  (!) 163/98 (!) 168/100  Pulse: (!) 101  (!) 101 (!) 106  Resp: 18  16 18   Temp: 99.1 F (37.3 C)     TempSrc: Oral     SpO2: 94%  95% 94%  Weight:  127 kg    Height:  5\' 4"  (1.626 m)     Eyes: PERRL, lids and conjunctivae normal ENMT: Mucous membranes are moist.  Neck: normal, supple Respiratory: clear to auscultation bilaterally, no wheezing, no crackles. Normal respiratory effort. No accessory muscle use.  Cardiovascular: Regular rate and rhythm, no murmurs / rubs / gallops. No extremity edema. Abdomen: no tenderness after patient has received IV Dilaudid, no masses palpated.  Bowel sounds positive.  Musculoskeletal: no clubbing / cyanosis. No joint deformity upper and lower extremities. Good ROM, no contractures. Normal muscle  tone.  Skin: no rashes, lesions, ulcers. No induration Neurologic: CN 2-12 grossly intact. Sensation intact, Strength 4 out of 5 of the lower extremity secondary to pain.   Psychiatric: Normal judgment and insight. Alert and oriented x 3.  Flat affect.    Labs on  Admission: I have personally reviewed following labs and imaging studies  CBC: Recent Labs  Lab 12/16/20 1345  WBC 13.6*  NEUTROABS 11.1*  HGB 10.7*  HCT 31.8*  MCV 89.1  PLT 240   Basic Metabolic Panel: Recent Labs  Lab 12/16/20 1345 12/16/20 1850  NA 131*  --   K 3.1*  --   CL 94*  --   CO2 26  --   GLUCOSE 151*  --   BUN 16  --   CREATININE 0.78  --   CALCIUM 9.2  --   MG  --  2.5*   GFR: Estimated Creatinine Clearance: 106.1 mL/min (by C-G formula based on SCr of 0.78 mg/dL). Liver Function Tests: Recent Labs  Lab 12/16/20 1345  AST 42*  ALT 22  ALKPHOS 914*  BILITOT 1.2  PROT 6.7  ALBUMIN 2.4*   No results for input(s): LIPASE, AMYLASE in the last 168 hours. No results for input(s): AMMONIA in the last 168 hours. Coagulation Profile: No results for input(s): INR, PROTIME in the last 168 hours. Cardiac Enzymes: Recent Labs  Lab 12/16/20 1850  CKTOTAL 212   BNP (last 3 results) No results for input(s): PROBNP in the last 8760 hours. HbA1C: No results for input(s): HGBA1C in the last 72 hours. CBG: No results for input(s): GLUCAP in the last 168 hours. Lipid Profile: No results for input(s): CHOL, HDL, LDLCALC, TRIG, CHOLHDL, LDLDIRECT in the last 72 hours. Thyroid Function Tests: No results for input(s): TSH, T4TOTAL, FREET4, T3FREE, THYROIDAB in the last 72 hours. Anemia Panel: No results for input(s): VITAMINB12, FOLATE, FERRITIN, TIBC, IRON, RETICCTPCT in the last 72 hours. Urine analysis:    Component Value Date/Time   COLORURINE AMBER (A) 12/16/2020 1346   APPEARANCEUR HAZY (A) 12/16/2020 1346   LABSPEC 1.016 12/16/2020 1346   PHURINE 6.0 12/16/2020 1346   GLUCOSEU NEGATIVE  12/16/2020 1346   GLUCOSEU NEGATIVE 05/20/2006 1135   HGBUR SMALL (A) 12/16/2020 1346   BILIRUBINUR NEGATIVE 12/16/2020 1346   BILIRUBINUR small (A) 08/09/2018 1412   KETONESUR NEGATIVE 12/16/2020 1346   PROTEINUR 30 (A) 12/16/2020 1346   UROBILINOGEN 1.0 08/09/2018 1412   UROBILINOGEN 0.2 04/26/2008 1156   NITRITE NEGATIVE 12/16/2020 1346   LEUKOCYTESUR LARGE (A) 12/16/2020 1346    Radiological Exams on Admission: DG Chest 2 View  Result Date: 12/16/2020 CLINICAL DATA:  Shortness of breath in a 55 year old female. EXAM: CHEST - 2 VIEW COMPARISON:  July 10, 2009. FINDINGS: Linear opacities in the LEFT chest of progressed slightly since the previous study. No lobar consolidation. Cardiomediastinal contours and hilar structures are stable. No sign of effusion. Mildly increased density in the subcarinal region on lateral projection. No acute skeletal process on limited assessment. IMPRESSION: Signs of scarring or atelectasis in the LEFT chest, slightly progressed since the previous study. Question developing subcarinal adenopathy, based on lateral projection. Developing lower lobe airspace disease is also a differential consideration. In a patient with pelvic masses on recent pelvic MRI would consider follow-up chest CT for further evaluation. Electronically Signed   By: Zetta Bills M.D.   On: 12/16/2020 14:13   US Pelvis Complete  Result Date: 12/16/2020 CLINICAL DATA:  Pelvic pain EXAM: TRANSABDOMINAL ULTRASOUND OF PELVIS DOPPLER ULTRASOUND OF OVARIES TECHNIQUE: Transabdominal ultrasound examination of the pelvis was performed including evaluation of the uterus, ovaries, adnexal regions, and pelvic cul-de-sac. Color and duplex Doppler ultrasound was utilized to evaluate blood flow to the ovaries. COMPARISON:  CT 12/16/2020, MRI 12/05/2020, pelvic ultrasound 12/01/2020  FINDINGS: Uterus Measurements: 6.8 x 3.4 x 5 cm = volume: 60.5 mL. No fibroids or other mass visualized. Endometrium Poorly  visible, unable to measure. Right ovary Measurements: 4.8 x 2.9 x 5.5 cm = volume: 38.6 mL. Solid right adnexal mass replaces most of right ovary. Left ovary Measurements: 10.5 x 8.7 x 9.2 cm = volume: 440 mL. Enlarged left ovary with heterogenous solid mass that replaces the left ovary. Previously the left ovary measured 8.3 x 7.7 x 8.5 cm on MRI. Pulsed Doppler evaluation demonstrates normal arterial and venous waveforms in both or adnexa, though note that both ovaries are essentially replaced by mass lesions. Other: No significant ascites. Foley catheter within the bladder which appears somewhat thick walled. IMPRESSION: 1. Very limited exam secondary to habitus and only trans abdominal technique 2. Bilateral solid adnexal masses essentially replacing the ovaries which thereby limits assessment of the ovaries. Left adnexal solid mass lesion has significantly increased in size; documented flow in the adnexa relates to flow within the solid adnexal masses. Gynecology follow-up recommended. Electronically Signed   By: Donavan Foil M.D.   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 EXAM: CT ABDOMEN AND PELVIS WITH CONTRAST TECHNIQUE: Multidetector CT imaging of the abdomen and pelvis was performed using the standard protocol following bolus administration of intravenous contrast. CONTRAST:  34mL OMNIPAQUE IOHEXOL 350 MG/ML SOLN COMPARISON:  Comparison is made with multiple prior studies which were performed recently, most recent comparison is an MRI of the pelvis of December 05, 2020. FINDINGS: Lower chest: Basilar atelectasis. Subcarinal region not well evaluated. No consolidation or effusion at the lung bases. Hepatobiliary: No focal, suspicious hepatic lesion. No pericholecystic stranding. Portal vein is patent. No biliary duct dilation. Pancreas: Pancreas  with normal contours. No signs of adjacent inflammation. Spleen: Spleen normal size and contour. Adrenals/Urinary Tract: Well-circumscribed LEFT adrenal lesion, density value of 19 Hounsfield units on previous noncontrast imaging measuring 3.2 x 2.6 cm., relative washout is indeterminate at 30%. Normal RIGHT adrenal. Normal, symmetric enhancement of bilateral kidneys without focal renal lesion. Stomach/Bowel: Small hiatal hernia. No stranding adjacent to the stomach. No sign of small bowel obstruction or acute small bowel process. The appendix is normal. Stool in various parts of the colon without signs of obstruction or adjacent stranding. Vascular/Lymphatic: Retroperitoneal adenopathy (image 50/2) 14 mm lymph node previously 11 mm along the LEFT common iliac chain. LEFT pelvic sidewall lymph node (image 75/2) 14 mm short axis, previously 13 mm. Similar size of RIGHT pelvic sidewall/external iliac lymph nodes also with little change compared to the most recent comparison imaging. Reproductive: Foley catheter in situ. Uterus and cervix not well at assess nor are the masses within the area of the cervix and urethra seen on previous MRI. Increasing size of LEFT ovary with heterogeneous appearance now measuring 10 x 8.8 cm. When assessed on the study of October 20, 2020 the ovary for reference measured 3.7 x 2.5 cm. RIGHT ovary with area of enhancement shows enlargement as well but not as pronounced as on previous imaging with 2.8 cm area of enhancement in the RIGHT ovary as the largest discrete area which is similar to the recent MRI evaluation. As compared to the study of October 20, 2020 there is little change in the appearance of the RIGHT ovary aside from this area though there is an increasingly nodular appearance of  the ovary in this location. Mass posterior to the LEFT ovary just above the uterus measuring 4.4 x 2.8 cm has enlarged from 2.5 cm. Other: Small volume ascites. Musculoskeletal: Heterogeneous  pattern associated with the spine in terms of density raises the question of metastatic disease without discrete measurable focal lesion in the spine. For example a geographic area of sclerosis on image 101/5) in the anterior L3 vertebral body does raise the question of underlying lesion with numerous additional foci, essentially nearly all levels of the spine with some degree of heterogeneity. Another example of this finding is noted on the RIGHT at the T10 level. No acute bone finding or frankly destructive bone process. IMPRESSION: Rapid increase in size of the LEFT ovary with some adjacent ascites. The rapid enlargement is seen in the setting of generalized worsening of masses associated with the LEFT and RIGHT ovary but perhaps more pronounced than other areas in the pelvis. The possibility of torsion associated with an ovarian mass could be considered based on this finding. Would correlate with any worsening abdominal pain and with sonogram as warranted for further assessment. Enlarging masses elsewhere though not to the extent that is seen with the LEFT ovary. Masslike area of the cervix and potentially within a urethral diverticulum not as well seen as on the recent MRI evaluation. Small volume ascites. Well-circumscribed LEFT adrenal lesion measuring 3.2 x 2.6 cm, density value of 19 Hounsfield units on previous noncontrast imaging. Relative washout is indeterminate at 30%. This may represent an adrenal adenoma. Consider dedicated adrenal protocol CT or attention on PET evaluation if performed. Heterogeneous pattern of subtle sclerosis and lucency in the spine on today's study raising the question of bony metastatic disease in the spine. Findings of increased ovarian size on the LEFT out of proportion other areas were called by telephone at the time of interpretation on 12/16/2020 at 5:44 pm to provider Theodis Blaze , who verbally acknowledged these results. Electronically Signed   By: Zetta Bills M.D.    On: 12/16/2020 17:46   [place] Art/Ven Flow Abd Pelv Doppler  Result Date: 12/16/2020 CLINICAL DATA:  Pelvic pain EXAM: TRANSABDOMINAL ULTRASOUND OF PELVIS DOPPLER ULTRASOUND OF OVARIES TECHNIQUE: Transabdominal ultrasound examination of the pelvis was performed including evaluation of the uterus, ovaries, adnexal regions, and pelvic cul-de-sac. Color and duplex Doppler ultrasound was utilized to evaluate blood flow to the ovaries. COMPARISON:  CT 12/16/2020, MRI 12/05/2020, pelvic ultrasound 12/01/2020 FINDINGS: Uterus Measurements: 6.8 x 3.4 x 5 cm = volume: 60.5 mL. No fibroids or other mass visualized. Endometrium Poorly visible, unable to measure. Right ovary Measurements: 4.8 x 2.9 x 5.5 cm = volume: 38.6 mL. Solid right adnexal mass replaces most of right ovary. Left ovary Measurements: 10.5 x 8.7 x 9.2 cm = volume: 440 mL. Enlarged left ovary with heterogenous solid mass that replaces the left ovary. Previously the left ovary measured 8.3 x 7.7 x 8.5 cm on MRI. Pulsed Doppler evaluation demonstrates normal arterial and venous waveforms in both or adnexa, though note that both ovaries are essentially replaced by mass lesions. Other: No significant ascites. Foley catheter within the bladder which appears somewhat thick walled. IMPRESSION: 1. Very limited exam secondary to habitus and only trans abdominal technique 2. Bilateral solid adnexal masses essentially replacing the ovaries which thereby limits assessment of the ovaries. Left adnexal solid mass lesion has significantly increased in size; documented flow in the adnexa relates to flow within the solid adnexal masses. Gynecology follow-up recommended. Electronically Signed   By:  Donavan Foil M.D.   On: 12/16/2020 19:23      Assessment/Plan  Worsening abdominal/LE pain in the setting of recent findings of bilateral ovarian mass, urethral mass, parcervical masses -suspect more due to rapidly worsening mass seen on CT abd rather than acute ovarian  torsion. Doppler limited by indwelling catheter but does show flow. - continue home oxycodone scheduled q6hr - Add PRN IV morphine 1mg  q2hr for moderate pain and 1mg  Dilaudid q3hr for severe pain -trial of Gabapentin for LE neuropathic pain  bilateral ovarian mass, urethral mass, parcervical masses, concerns of metastasis to bone and lung -Has worsening LE pain. Obtain MRI L-spine - CXR concerning for subcarinal adenopathy. Obtain CT chest w contrast  - Pt has seen [facility] Dr. Berline Lopes outpatient and she feels source could be urothelial related and has been correlating with urology outpatient for further biopsy   Asymptomatic bacteriuria -Patient has indwelling catheter since 10/27 due to urinary symptoms caused by urethral mass.  Has planned outpatient urology visit on 11/15 -UA shows positive protein, large leukocyte, negative nitrite and rare bacteria.  We will hold on antibiotics pending urine culture  Hypokalemia - K of 3.1.  Replete with oral supplementation  Leukocytosis WBC of 13.6 likely reactive to malignancy.  This has been elevated for the past month.  low suspicion for any acute infection  OSA Continue CPAP  Hypertension Resume med list once she is able to verify her meds  Type 2 diabetes Placed on sensitive sliding scale  DVT prophylaxis:.Lovenox Code Status: Full Family Communication: Plan discussed with patient at bedside  disposition Plan: Home with at least 2 midnight stays  Consults called:  Admission status: inpatient  Level of care: Med-Surg  Status is: Inpatient  Remains inpatient appropriate because: Recently diagnosed metastatic disease requiring IV pain management         Orene Desanctis DO [facility]   If 7PM-7AM, please contact night-coverage [email or web address]   12/16/2020, 8:22 PM

## 2020-12-16 NOTE — ED Provider Notes (Signed)
Emergency Medicine Provider Triage Evaluation Note  Erin [name] , a 55 y.o. female  was evaluated in triage.  Pt complains of abdominal pain and bilateral leg pain and weakness.  Patient states that since last week she has had ongoing abdominal and lower extremity pain.  States she has been having increasing weakness and difficulty ambulating around her house.  She states she is using a golf club as a cane but has been having increasing difficulty.  States she does feel intermittently short of breath with exertion.  It appears that she was recently diagnosed with ovarian cancer.  Last office visit was 12/13/2020 with Dr. Berline Lopes where she had an MRI and imaging concerning for metastatic cancer including urethral mass, bilateral ovarian masses and evidence of bony metastasis.  She has been prescribed oxycodone and hydrocodone with some relief in pain however she states it is not working at this time.  She continues to have ongoing night sweats.  Has pending PET scan on 11/11.  She denies nausea, vomiting, diarrhea, fevers.   Review of Systems  Positive: Abdominal pain, bilateral lower extremity pain mild weakness Negative: Nausea, vomiting, diarrhea, fevers  Physical Exam  BP (!) 151/93 (BP Location: Left Arm)   Pulse (!) 101   Temp 99.1 F (37.3 C) (Oral)   Resp 18   Ht 5\' 4"  (1.626 m)   Wt 127 kg   SpO2 94%   BMI 48.06 kg/m  Gen:   Awake, no distress,  Resp:  Normal effort, lungs clear to auscultation bilaterally MSK:   Bilateral lower extremities 2/5 strength, bilateral upper extremities 4/5 strength.  Sensation intact. Other:  S1/S2 with tachycardia.  Pulses 1+ in bilateral upper and lower extremities.  Abdomen is protuberant, soft, diffuse tenderness.  Bowel sounds present.  Medical Decision Making  Medically screening exam initiated at 1:38 PM.  Appropriate orders placed.  MICA RAMDASS was informed that the remainder of the evaluation will be completed by another provider,  this initial triage assessment does not replace that evaluation, and the importance of remaining in the ED until their evaluation is complete.     Mickie Hillier, PA-C 12/16/20 1343    Blanchie Dessert, MD 12/16/20 1430

## 2020-12-16 NOTE — ED Notes (Signed)
Patient transported to MRI 

## 2020-12-16 NOTE — ED Triage Notes (Signed)
Ems brings pt in from home for LLQ pain and bilateral lower leg pain. Pt denies N/V/D. Pt states her doctor gave pain meds 3 days ago but it isn't working.

## 2020-12-16 NOTE — Telephone Encounter (Signed)
Patient called and stated "I saw Dr Berline Lopes Friday and she gave me oxycodone for the pain I'm having. It's not helping. I feel very weak, I can hardly walk, I'm sweating like crazy even with the air condition on full blast. I am not able to eat. I am SOB. The pain is in my lower stomach area and my legs. The pain is at a 10 with no relief, even with also taking ibuprofen. I wish someone could just put me in the hospital and help me." Patient was very tearful on the phone.

## 2020-12-16 NOTE — ED Notes (Signed)
U/S in with patient now

## 2020-12-16 NOTE — Telephone Encounter (Signed)
Told Ms Philbin that Dr. Berline Lopes said that she needs to go to the ER to be evaluated.  Pt verbalized understanding. Told her that her Pet scan was r/s from 12-26-20 to 11-11 at 12 pm. She needs to arrive at 1130. Pt verbalized understanding.

## 2020-12-17 ENCOUNTER — Inpatient Hospital Stay (HOSPITAL_COMMUNITY): Payer: Self-pay

## 2020-12-17 ENCOUNTER — Ambulatory Visit: Admit: 2020-12-17 | Payer: Self-pay | Admitting: Gynecologic Oncology

## 2020-12-17 ENCOUNTER — Inpatient Hospital Stay (HOSPITAL_COMMUNITY): Payer: Self-pay | Admitting: Anesthesiology

## 2020-12-17 ENCOUNTER — Encounter (HOSPITAL_COMMUNITY): Payer: Self-pay | Admitting: Family Medicine

## 2020-12-17 ENCOUNTER — Encounter (HOSPITAL_COMMUNITY)
Admission: EM | Disposition: A | Discharge status: Home Health | Payer: Self-pay | Source: Home / Self Care | Attending: Internal Medicine

## 2020-12-17 DIAGNOSIS — N368 Other specified disorders of urethra: Secondary | ICD-10-CM

## 2020-12-17 DIAGNOSIS — K5900 Constipation, unspecified: Secondary | ICD-10-CM

## 2020-12-17 DIAGNOSIS — C7982 Secondary malignant neoplasm of genital organs: Secondary | ICD-10-CM

## 2020-12-17 DIAGNOSIS — C7911 Secondary malignant neoplasm of bladder: Secondary | ICD-10-CM

## 2020-12-17 HISTORY — PX: CYSTOSCOPY: SHX5120

## 2020-12-17 LAB — GLUCOSE, CAPILLARY
Glucose-Capillary: 140 mg/dL — ABNORMAL HIGH (ref 70–99)
Glucose-Capillary: 143 mg/dL — ABNORMAL HIGH (ref 70–99)
Glucose-Capillary: 159 mg/dL — ABNORMAL HIGH (ref 70–99)
Glucose-Capillary: 144 mg/dL — ABNORMAL HIGH (ref 70–99)

## 2020-12-17 LAB — BASIC METABOLIC PANEL
Anion gap: 11 (ref 5–15)
BUN: 23 mg/dL — ABNORMAL HIGH (ref 6–20)
CO2: 27 mmol/L (ref 22–32)
Calcium: 9.2 mg/dL (ref 8.9–10.3)
Chloride: 93 mmol/L — ABNORMAL LOW (ref 98–111)
Creatinine, Ser: 0.85 mg/dL (ref 0.44–1.00)
GFR, Estimated: >60 mL/min (ref >60)
Glucose, Bld: 167 mg/dL — ABNORMAL HIGH (ref 70–99)
Potassium: 3.5 mmol/L (ref 3.5–5.1)
Sodium: 131 mmol/L — ABNORMAL LOW (ref 135–145)

## 2020-12-17 LAB — HEMOGLOBIN A1C
Hgb A1c MFr Bld: 7.1 % — ABNORMAL HIGH (ref 4.8–5.6)
Mean Plasma Glucose: 157.07 mg/dL

## 2020-12-17 LAB — URINE CULTURE: Culture: 90000 — AB

## 2020-12-17 LAB — CBC
HCT: 30.6 % — ABNORMAL LOW (ref 36.0–46.0)
Hemoglobin: 10.3 g/dL — ABNORMAL LOW (ref 12.0–15.0)
MCH: 30.5 pg (ref 26.0–34.0)
MCHC: 33.7 g/dL (ref 30.0–36.0)
MCV: 90.5 fL (ref 80.0–100.0)
Platelets: 239 10*3/uL (ref 150–400)
RBC: 3.38 MIL/uL — ABNORMAL LOW (ref 3.87–5.11)
RDW: 13.9 % (ref 11.5–15.5)
WBC: 13.9 10*3/uL — ABNORMAL HIGH (ref 4.0–10.5)
nRBC: 0.1 % (ref 0.0–0.2)

## 2020-12-17 LAB — LACTIC ACID, PLASMA: Lactic Acid, Venous: 0.9 mmol/L (ref 0.5–1.9)

## 2020-12-17 SURGERY — EXAM UNDER ANESTHESIA
Anesthesia: General

## 2020-12-17 MED ORDER — CHLORHEXIDINE GLUCONATE CLOTH 2 % EX PADS
6.0000 | MEDICATED_PAD | Freq: Every day | CUTANEOUS | Status: DC
  Administered 2020-12-17 – 2020-12-21 (×5): 6 via TOPICAL

## 2020-12-17 MED ORDER — CEFAZOLIN IN SODIUM CHLORIDE 3-0.9 GM/100ML-% IV SOLN
3.0000 g | Freq: Once | INTRAVENOUS | Status: AC
  Administered 2020-12-17: 3 g via INTRAVENOUS
  Filled 2020-12-17: qty 100

## 2020-12-17 MED ORDER — OXYCODONE HCL 5 MG PO TABS
10.0000 mg | ORAL_TABLET | Freq: Four times a day (QID) | ORAL | Status: DC
  Administered 2020-12-18 – 2020-12-22 (×18): 10 mg via ORAL
  Filled 2020-12-17 (×18): qty 2

## 2020-12-17 MED ORDER — MIDAZOLAM HCL 2 MG/2ML IJ SOLN
INTRAMUSCULAR | Status: AC
  Filled 2020-12-17: qty 2

## 2020-12-17 MED ORDER — MIDAZOLAM HCL 5 MG/5ML IJ SOLN
INTRAMUSCULAR | Status: DC | PRN
  Administered 2020-12-17: 1 mg via INTRAVENOUS

## 2020-12-17 MED ORDER — DEXTROSE 5 % IV SOLN: 3.0000 g | Freq: Once | INTRAVENOUS | Status: DC

## 2020-12-17 MED ORDER — HYDROMORPHONE HCL 1 MG/ML IJ SOLN
INTRAMUSCULAR | Status: AC
  Filled 2020-12-17: qty 1

## 2020-12-17 MED ORDER — SODIUM CHLORIDE 0.9 % IR SOLN
Status: DC | PRN
  Administered 2020-12-17 (×2): 3000 mL

## 2020-12-17 MED ORDER — FENTANYL CITRATE (PF) 250 MCG/5ML IJ SOLN
INTRAMUSCULAR | Status: AC
  Filled 2020-12-17: qty 5

## 2020-12-17 MED ORDER — IOHEXOL 350 MG/ML SOLN
80.0000 mL | Freq: Once | INTRAVENOUS | Status: AC | PRN
  Administered 2020-12-17: 80 mL via INTRAVENOUS

## 2020-12-17 MED ORDER — OXYCODONE HCL 5 MG PO TABS
ORAL_TABLET | ORAL | Status: AC
  Filled 2020-12-17: qty 1

## 2020-12-17 MED ORDER — ONDANSETRON HCL 4 MG/2ML IJ SOLN
INTRAMUSCULAR | Status: AC
  Filled 2020-12-17: qty 2

## 2020-12-17 MED ORDER — PROPOFOL 10 MG/ML IV BOLUS
INTRAVENOUS | Status: DC | PRN
  Administered 2020-12-17: 250 mg via INTRAVENOUS

## 2020-12-17 MED ORDER — PROPOFOL 500 MG/50ML IV EMUL
INTRAVENOUS | Status: DC | PRN
  Administered 2020-12-17: 75 ug/kg/min via INTRAVENOUS

## 2020-12-17 MED ORDER — SENNOSIDES-DOCUSATE SODIUM 8.6-50 MG PO TABS
1.0000 | ORAL_TABLET | Freq: Two times a day (BID) | ORAL | Status: DC
  Administered 2020-12-17 – 2021-01-08 (×35): 1 via ORAL
  Filled 2020-12-17 (×35): qty 1

## 2020-12-17 MED ORDER — ONDANSETRON HCL 4 MG/2ML IJ SOLN
INTRAMUSCULAR | Status: DC | PRN
  Administered 2020-12-17: 4 mg via INTRAVENOUS

## 2020-12-17 MED ORDER — FENTANYL CITRATE PF 50 MCG/ML IJ SOSY
PREFILLED_SYRINGE | INTRAMUSCULAR | Status: AC
  Filled 2020-12-17: qty 1

## 2020-12-17 MED ORDER — FENTANYL CITRATE PF 50 MCG/ML IJ SOSY
25.0000 ug | PREFILLED_SYRINGE | INTRAMUSCULAR | Status: DC | PRN
  Administered 2020-12-17 (×3): 50 ug via INTRAVENOUS

## 2020-12-17 MED ORDER — FERRIC SUBSULFATE 259 MG/GM EX SOLN
CUTANEOUS | Status: AC
  Filled 2020-12-17: qty 8

## 2020-12-17 MED ORDER — KETOROLAC TROMETHAMINE 30 MG/ML IJ SOLN
30.0000 mg | Freq: Once | INTRAMUSCULAR | Status: AC | PRN
  Administered 2020-12-17: 30 mg via INTRAVENOUS

## 2020-12-17 MED ORDER — POLYETHYLENE GLYCOL 3350 17 G PO PACK
17.0000 g | PACK | Freq: Two times a day (BID) | ORAL | Status: DC
  Administered 2020-12-17 – 2020-12-19 (×4): 17 g via ORAL
  Filled 2020-12-17 (×7): qty 1

## 2020-12-17 MED ORDER — OXYCODONE HCL 5 MG/5ML PO SOLN: 5.0000 mg | Freq: Once | ORAL | Status: AC | PRN

## 2020-12-17 MED ORDER — STERILE WATER FOR IRRIGATION IR SOLN
Status: DC | PRN
  Administered 2020-12-17: 3000 mL
  Administered 2020-12-17: 500 mL

## 2020-12-17 MED ORDER — PROMETHAZINE HCL 25 MG/ML IJ SOLN: 6.2500 mg | INTRAMUSCULAR | Status: DC | PRN

## 2020-12-17 MED ORDER — 0.9 % SODIUM CHLORIDE (POUR BTL) OPTIME
TOPICAL | Status: DC | PRN
  Administered 2020-12-17: 1000 mL

## 2020-12-17 MED ORDER — FENTANYL CITRATE PF 50 MCG/ML IJ SOSY
PREFILLED_SYRINGE | INTRAMUSCULAR | Status: AC
  Filled 2020-12-17: qty 2

## 2020-12-17 MED ORDER — DEXAMETHASONE SODIUM PHOSPHATE 10 MG/ML IJ SOLN
INTRAMUSCULAR | Status: AC
  Filled 2020-12-17: qty 1

## 2020-12-17 MED ORDER — OXYCODONE HCL 5 MG PO TABS
5.0000 mg | ORAL_TABLET | Freq: Once | ORAL | Status: AC | PRN
  Administered 2020-12-17: 5 mg via ORAL

## 2020-12-17 MED ORDER — HYDROMORPHONE HCL 1 MG/ML IJ SOLN
1.0000 mg | INTRAMUSCULAR | Status: DC | PRN
  Administered 2020-12-17: 1 mg via INTRAVENOUS

## 2020-12-17 MED ORDER — PROPOFOL 10 MG/ML IV BOLUS
INTRAVENOUS | Status: AC
  Filled 2020-12-17: qty 40

## 2020-12-17 MED ORDER — SORBITOL 70 % SOLN
30.0000 mL | Freq: Every day | Status: DC
  Administered 2020-12-18 – 2020-12-23 (×4): 30 mL via ORAL
  Filled 2020-12-17 (×7): qty 30

## 2020-12-17 MED ORDER — HYDROMORPHONE HCL 1 MG/ML IJ SOLN
1.5000 mg | INTRAMUSCULAR | Status: DC | PRN
  Administered 2020-12-17 – 2020-12-23 (×23): 1.5 mg via INTRAVENOUS
  Filled 2020-12-17 (×23): qty 1.5

## 2020-12-17 MED ORDER — LACTATED RINGERS IV SOLN: INTRAVENOUS | Status: DC

## 2020-12-17 MED ORDER — KETOROLAC TROMETHAMINE 30 MG/ML IJ SOLN
INTRAMUSCULAR | Status: AC
  Filled 2020-12-17: qty 1

## 2020-12-17 MED ORDER — FENTANYL CITRATE (PF) 100 MCG/2ML IJ SOLN
INTRAMUSCULAR | Status: DC | PRN
  Administered 2020-12-17: 100 ug via INTRAVENOUS
  Administered 2020-12-17 (×3): 50 ug via INTRAVENOUS

## 2020-12-17 MED ORDER — CHLORHEXIDINE GLUCONATE 0.12 % MT SOLN
15.0000 mL | Freq: Once | OROMUCOSAL | Status: AC
  Administered 2020-12-17: 15 mL via OROMUCOSAL

## 2020-12-17 SURGICAL SUPPLY — 22 items
BAG DRN RND TRDRP ANRFLXCHMBR (UROLOGICAL SUPPLIES) ×1
BAG URINE DRAIN 2000ML AR STRL (UROLOGICAL SUPPLIES) ×1 IMPLANT
BAG URO CATCHER STRL LF (MISCELLANEOUS) ×2 IMPLANT
CATH FOLEY 2WAY SLVR  5CC 20FR (CATHETERS) ×2
CATH FOLEY 2WAY SLVR 5CC 20FR (CATHETERS) IMPLANT
CATH INTERMIT  6FR 70CM (CATHETERS) ×1 IMPLANT
CATH URETL OPEN END 6FR 70 (CATHETERS) ×2 IMPLANT
CLOTH BEACON ORANGE TIMEOUT ST (SAFETY) ×2 IMPLANT
DRSG TELFA 3X8 NADH (GAUZE/BANDAGES/DRESSINGS) ×2 IMPLANT
GAUZE 4X4 16PLY ~~LOC~~+RFID DBL (SPONGE) ×1 IMPLANT
GLOVE SURG ENC MOIS LTX SZ6.5 (GLOVE) ×2 IMPLANT
GOWN STRL REUS W/TWL LRG LVL3 (GOWN DISPOSABLE) ×4 IMPLANT
GUIDEWIRE STR DUAL SENSOR (WIRE) ×2 IMPLANT
INST BIOPSY MAXCORE 18GX25 (NEEDLE) ×1 IMPLANT
LOOP CUT BIPOLAR 24F LRG (ELECTROSURGICAL) ×1 IMPLANT
MANIFOLD NEPTUNE II (INSTRUMENTS) ×2 IMPLANT
PACK CYSTO (CUSTOM PROCEDURE TRAY) ×2 IMPLANT
PAD DRESSING TELFA 3X8 NADH (GAUZE/BANDAGES/DRESSINGS) IMPLANT
SYR TOOMEY IRRIG 70ML (MISCELLANEOUS) ×2
SYRINGE TOOMEY IRRIG 70ML (MISCELLANEOUS) IMPLANT
TUBING CONNECTING 10 (TUBING) ×2 IMPLANT
TUBING UROLOGY SET (TUBING) IMPLANT

## 2020-12-17 NOTE — Assessment & Plan Note (Signed)
-   continue SSI and CBG monitoring

## 2020-12-17 NOTE — Progress Notes (Signed)
Progress Note    Erin [name]   PPI:[id number]  DOB: [birth year]  DOA: 12/16/2020     1 PCP: Pcp, No  Initial CC: abdominal pain  Hospital Course: Erin [name] is a 55 y.o. female with PMH OSA on CPAP, HTN, DMII, morbid obesity who presented with worsening abdominal pain and left thigh pain.   She has been undergoing workup for recently discovered cervical/urethral masses on CT in Sept 2022.  Further imaging showed ongoing interval enlargement of the left adnexal area.  There was also associated retroperitoneal and pelvic adenopathy. MRI pelvis also performed on 12/05/2020 showed poorly enhancing mass centered at the urethra, solid enhancing bilateral ovarian masses, poorly enhancing mass involving left uterine cervix, and significant adenopathy involving bilateral common iliac, bilateral external iliac, bilateral inguinal lymph nodes.  Also noted to have pelvic osseous lesions concerning for metastatic disease as well.  She was admitted for biopsy with urology and GYN oncology.  She was able to undergo cystoscopy with urethral and bladder biopsy on 12/17/2020.  She also underwent cervical biopsies following this procedure.    Interval History:  Seen in her room early this evening after returning from the OR.  Still complaining of ongoing significant pain and states that the Dilaudid wears off after about 1 hour.  She also endorses constipation and has not had a bowel movement in approximately 5 to 6 days.  Assessment & Plan: * Mass of urethra - s/p MRI pelvis on 12/05/20 -Biopsy taken during cystoscopy on 12/17/2020 - Follow-up biopsy results as able - Continue Foley in place  Ovarian mass - s/p MRI pelvis on 12/05/20 showing several masses - now s/p cervical, uretheral, and bladder biopsy on 11/8 - large concern for obvious malignancy; primary to be determined but appears to have significant mets; PET pending  Abdominal pain - Presumed due to underlying multiple masses with  significant lymphadenopathy and pelvic osseous lesions - Currently pain control is her biggest issue as well - Increase Dilaudid and increase oxycodone doses.  May need to convert to extended release OxyContin at some point - Low threshold for involving palliative care for further pain control assistance as well as [place] discussions once further biopsy results available and treatment/prognosis discussions   Constipation - patient endorses no BM in 5-6 days; uses laxatives at home to try and stay regular - at higher risk with opioids - starting on bowel regimen   Hypokalemia - Replete as needed  OSA (obstructive sleep apnea) - Continue nightly CPAP  Diabetes mellitus ([place]) - continue SSI and CBG monitoring   Essential hypertension - Initially elevated on admission, has since improved.  Possibly from some uncontrolled pain initially - Continue monitoring pressure and will treat further as needed   Old records reviewed in assessment of this patient  Antimicrobials:   DVT prophylaxis: Lovenox  Code Status:   Code Status: Full Code  Disposition Plan: Status is: Inpatient  Remains inpatient appropriate because: Ongoing treatment of above   Objective: Blood pressure 140/65, pulse 100, temperature (!) 97.5 F (36.4 C), temperature source Oral, resp. rate 18, height 5\' 4"  (1.626 m), weight 127 kg, SpO2 97 %.  Examination:  Physical Exam Constitutional:      General: She is not in acute distress.    Appearance: She is well-developed. She is obese.     Comments: Uncomfortable appearing  HENT:     Head: Normocephalic and atraumatic.     Mouth/Throat:     Mouth: Mucous membranes are moist.  Eyes:     Extraocular Movements: Extraocular movements intact.  Cardiovascular:     Rate and Rhythm: Normal rate and regular rhythm.  Pulmonary:     Effort: Pulmonary effort is normal.     Breath sounds: Normal breath sounds.  Abdominal:     Comments: Obese, slightly distended,  hypoactive bowel sounds, nonspecific tenderness but worse in lower quadrants  Musculoskeletal:        General: Normal range of motion.     Cervical back: Normal range of motion and neck supple.  Skin:    General: Skin is warm and dry.  Neurological:     General: No focal deficit present.     Mental Status: She is alert.  Psychiatric:        Mood and Affect: Mood normal.        Behavior: Behavior normal.     Consultants:  Urology GYN oncology  Procedures:  Diagnostic cystoscopy, bladder biopsy, urethral biopsy, 12/17/20 Cervical biopsies, 12/17/2020  Data Reviewed: I have personally reviewed labs and imaging studies     LOS: 1 day  Time spent: Greater than 50% of the 35 minute visit was spent in counseling/coordination of care for the patient as laid out in the A&P.   Dwyane Dee, MD [facility] 12/17/2020, 6:35 PM

## 2020-12-17 NOTE — Anesthesia Preprocedure Evaluation (Signed)
Anesthesia Evaluation  Patient identified by MRN, date of birth, ID band Patient awake  General Assessment Comment:Metastatic cancer of unknown origin  Reviewed: Allergy & Precautions, NPO status , Patient's Chart, lab work & pertinent test results  Airway Mallampati: II  TM Distance: <3 FB Neck ROM: Full    Dental no notable dental hx.    Pulmonary sleep apnea and Continuous Positive Airway Pressure Ventilation , former smoker,    Pulmonary exam normal breath sounds clear to auscultation (-) decreased breath sounds      Cardiovascular hypertension,  Rhythm:Regular Rate:Tachycardia     Neuro/Psych negative neurological ROS  negative psych ROS   GI/Hepatic Neg liver ROS, GERD  ,  Endo/Other  diabetesMorbid obesity  Renal/GU negative Renal ROS  negative genitourinary   Musculoskeletal negative musculoskeletal ROS (+)   Abdominal (+) + obese,   Peds negative pediatric ROS (+)  Hematology negative hematology ROS (+)   Anesthesia Other Findings   Reproductive/Obstetrics negative OB ROS                             Anesthesia Physical Anesthesia Plan  ASA: 4  Anesthesia Plan: General   Post-op Pain Management:    Induction: Intravenous  PONV Risk Score and Plan: 3 and Ondansetron, Dexamethasone, Midazolam and Treatment may vary due to age or medical condition  Airway Management Planned: LMA  Additional Equipment:   Intra-op Plan:   Post-operative Plan: Extubation in OR  Informed Consent: I have reviewed the patients History and Physical, chart, labs and discussed the procedure including the risks, benefits and alternatives for the proposed anesthesia with the patient or authorized representative who has indicated his/her understanding and acceptance.     Dental advisory given  Plan Discussed with: CRNA and Surgeon  Anesthesia Plan Comments:         Anesthesia Quick  Evaluation

## 2020-12-17 NOTE — Assessment & Plan Note (Signed)
-  Continue nightly CPAP °

## 2020-12-17 NOTE — Assessment & Plan Note (Addendum)
-   s/p MRI pelvis on 12/05/20 -Biopsy taken during cystoscopy on 12/17/2020 - biopsy has returned on 11/11. 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

## 2020-12-17 NOTE — Transfer of Care (Signed)
Immediate Anesthesia Transfer of Care Note  Patient: Erin [name]  Procedure(s) Performed: EXAM UNDER ANESTHESIA with cervical biopsy CYSTOSCOPY with biopsies  Patient Location: PACU  Anesthesia Type:General  Level of Consciousness: awake, alert , oriented and patient cooperative  Airway & Oxygen Therapy: Patient Spontanous Breathing and Patient connected to face mask oxygen  Post-op Assessment: Report given to RN, Post -op Vital signs reviewed and stable and Patient moving all extremities X 4  Post vital signs: stable  Last Vitals:  Vitals Value Taken Time  BP 133/79 12/17/20 1015  Temp    Pulse 93 12/17/20 1018  Resp 22 12/17/20 1018  SpO2 90 % 12/17/20 1018  Vitals shown include unvalidated device data.  Last Pain:  Vitals:   12/17/20 0841  TempSrc: Oral  PainSc:       Patients Stated Pain Goal: 7 (67/42/55 2589)  Complications: No notable events documented.

## 2020-12-17 NOTE — Consult Note (Signed)
I have been asked to see the patient by Dr. Jeral Pinch for evaluation and management of urethral mass.  History of present illness: 55 year old woman who initially presented to the ED on 10/20/2020 with urinary retention.  Initial imaging showed a 2 cm tubular area of hypoattenuation in the cervix and urethra but no evidence of metastatic disease.  At that time a Foley catheter was placed and she was seen at [facility].  She initially passed a void trial and was thought to have rupture cyst however she presented back to the emergency room with worsening pain and purulent drainage from urethra/vagina.  Repeat imaging shows concern for urethral mass with extensive progression of metastatic disease.  Patient is currently to be admitted from the emergency room for expedited work-up of metastatic malignancy.  She has not yet had a cystoscopy or biopsy of urethral mass.   comprehensive review of systems was obtained and is negative unless otherwise stated in the history of present illness.  Patient Active Problem List   Diagnosis Date Noted   Abdominal pain 12/16/2020   Ovarian mass 12/16/2020   Hypokalemia 12/16/2020   Tear of right rotator cuff 04/04/2020   Vitamin D deficiency 04/02/2020   Other fatigue 03/13/2020   Shortness of breath on exertion 03/13/2020   OSA (obstructive sleep apnea) 11/25/2017   Obesity hypoventilation syndrome ([place]) 11/25/2017   Mallet finger of right hand 09/03/2017   Anterolisthesis 04/17/2017   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[place]) 04/17/2017   Degenerative disc disease, lumbar 04/17/2017   Class 3 severe obesity with serious comorbidity and body mass index (BMI) of 50.0 to 59.9 in adult [facility]) 06/01/2016   Diabetes mellitus ([place]) 05/27/2016   Essential hypertension 04/13/2016   Menorrhagia 08/26/2015    Class: Present on Admission    No current facility-administered medications on file prior to encounter.   Current Outpatient Medications on File Prior to  Encounter  Medication Sig Dispense Refill   amLODipine (NORVASC) 5 MG tablet TAKE 1 TABLET (5 MG TOTAL) BY MOUTH DAILY. 90 tablet 3   buPROPion (WELLBUTRIN SR) 200 MG 12 hr tablet Take 1 tablet (200 mg total) by mouth every morning. (Patient not taking: Reported on 12/12/2020) 30 tablet 0   carvedilol (COREG) 6.25 MG tablet TAKE 1 TABLET BY MOUTH 2 TIMES DAILY WITH A MEAL. (Patient taking differently: Take 6.25 mg by mouth daily.) 180 tablet 3   dexamethasone (DECADRON) 1 MG tablet Take 1 tablet (1 mg total) by mouth at bedtime at 11PM prior to 8AM cortisol lab draw (Patient not taking: Reported on 12/12/2020) 1 tablet 0   glycopyrrolate (ROBINUL) 1 MG tablet Take 1 tablet by mouth daily. (Patient not taking: Reported on 12/12/2020)  2   hydrochlorothiazide (HYDRODIURIL) 25 MG tablet TAKE 1 TABLET (25 MG TOTAL) BY MOUTH DAILY. 90 tablet 3   HYDROcodone-acetaminophen (NORCO/VICODIN) 5-325 MG tablet Take 1 tablet by mouth every 4 (four) hours as needed. (Patient not taking: Reported on 12/12/2020) 12 tablet 0   metFORMIN (GLUCOPHAGE) 500 MG tablet Take 1 tablet (500 mg total) by mouth daily. 90 tablet 1   metroNIDAZOLE (FLAGYL) 500 MG tablet Take 1 tablet (500 mg total) by mouth 2 (two) times daily. One po bid x 7 days (Patient not taking: Reported on 12/12/2020) 14 tablet 0   olmesartan (BENICAR) 40 MG tablet TAKE 1 TABLET (40 MG TOTAL) BY MOUTH DAILY. 90 tablet 3   oxyCODONE (OXY IR/ROXICODONE) 5 MG immediate release tablet Take 1 tablet (5 mg total) by mouth  every 6 (six) hours as needed for up to 20 doses for severe pain. 20 tablet 0   oxyCODONE-acetaminophen (PERCOCET/ROXICET) 5-325 MG tablet Take 1 tablet by mouth every 6 (six) hours as needed for severe pain. (Patient not taking: Reported on 12/12/2020) 15 tablet 0   phenazopyridine (PYRIDIUM) 200 MG tablet Take 1 tablet (200 mg total) by mouth 3 (three) times daily as needed for pain. (Patient not taking: Reported on 12/12/2020) 9 tablet 0    rosuvastatin (CRESTOR) 10 MG tablet TAKE 1 TABLET (10 MG TOTAL) BY MOUTH DAILY. 90 tablet 3   Semaglutide, 1 MG/DOSE, (OZEMPIC, 1 MG/DOSE,) 4 MG/3ML SOPN Inject 1 mg into the skin once a week. (Patient not taking: Reported on 12/12/2020) 3 mL 0   traMADol (ULTRAM) 50 MG tablet Take 1 tablet (50 mg total) by mouth every 6 (six) hours for 5 days (Patient not taking: Reported on 12/12/2020) 20 tablet 0   Vitamin D, Ergocalciferol, (DRISDOL) 1.25 MG (50000 UNIT) CAPS capsule Take 1 capsule (50,000 Units total) by mouth every 7 (seven) days. (Patient not taking: Reported on 12/12/2020) 4 capsule 0    Past Medical History:  Diagnosis Date   Anxiety    Constipation    Depression    GERD (gastroesophageal reflux disease)    Pt states she no longer needs meds   Hyperlipidemia    Hypertension    Menorrhagia    OSA (obstructive sleep apnea) 11/25/2017   OSA on CPAP    Other fatigue    Shortness of breath on exertion    Type 2 diabetes mellitus ([place])    Vitamin D deficiency    Wears glasses     Past Surgical History:  Procedure Laterality Date   BREAST EXCISIONAL BIOPSY     DILITATION & CURRETTAGE/HYSTROSCOPY WITH NOVASURE ABLATION N/A 08/26/2015   Procedure: DILATATION & CURETTAGE/HYSTEROSCOPY WITH NOVASURE ABLATION;  Surgeon: [name], MD;  Location: [place];  Service: Gynecology;  Laterality: N/A;   EXCISIONAL LEFT BREAST BX  11-14-2007   benign   LAPAROSCOPIC TUBAL LIGATION Bilateral 08/26/2015   Procedure: LAPAROSCOPIC TUBAL LIGATION;  Surgeon: [name], MD;  Location: [place];  Service: Gynecology;  Laterality: Bilateral;   SHOULDER ARTHROSCOPY WITH ROTATOR CUFF REPAIR AND SUBACROMIAL DECOMPRESSION Right 04/08/2020   Procedure: SHOULDER ARTHROSCOPY WITH ROTATOR CUFF REPAIR AND SUBACROMIAL DECOMPRESSION;  Surgeon: Tania Ade, MD;  Location: [facility];  Service: Orthopedics;  Laterality: Right;  NEED 62 MINUTES FOR THIS CASE   THYROID LOBECTOMY  Right 04-30-2008    Social History   Tobacco Use   Smoking status: Former    Years: 15.00    Types: Cigarettes    Quit date: 2001    Years since quitting: 21.8   Smokeless tobacco: Never  Vaping Use   Vaping Use: Never used  Substance Use Topics   Alcohol use: No   Drug use: Yes    Types: Marijuana    Family History  Problem Relation Age of Onset   Stroke Mother    Diabetes Mother    Hypertension Mother    Obesity Mother    Glaucoma Father    Alcoholism Father    Heart disease Paternal Grandmother        enlarged heart   Diabetes Half-Sister    Hypertension Half-Sister    Colon cancer Neg Hx    Esophageal cancer Neg Hx    Stomach cancer Neg Hx    Colon polyps Neg Hx  Rectal cancer Neg Hx    Breast cancer Neg Hx    Ovarian cancer Neg Hx    Endometrial cancer Neg Hx    Pancreatic cancer Neg Hx    Prostate cancer Neg Hx     PE: Vitals:   12/17/20 0430 12/17/20 0458 12/17/20 0500 12/17/20 0800  BP: 133/77 133/77 135/75 139/89  Pulse: 98 96 100 94  Resp: 20 (!) 21 (!) 22 18  Temp:      TempSrc:      SpO2: 93%  93% 95%  Weight:      Height:       Patient appears to be in no acute distress  patient is alert and oriented x3 Atraumatic normocephalic head No increased work of breathing, no audible wheezes/rhonchi Regular sinus rhythm/rate Abdomen is soft, nondistended GU: foley draining yellow urine, pelvic exam deferred to operating room Lower extremities are symmetric without appreciable edema Grossly neurologically intact No identifiable skin lesions  Recent Labs    12/16/20 1345 12/17/20 0400  WBC 13.6* 13.9*  HGB 10.7* 10.3*  HCT 31.8* 30.6*   Recent Labs    12/16/20 1345 12/17/20 0400  NA 131* 131*  K 3.1* 3.5  CL 94* 93*  CO2 26 27  GLUCOSE 151* 167*  BUN 16 23*  CREATININE 0.78 0.85  CALCIUM 9.2 9.2   No results for input(s): LABPT, INR in the last 72 hours. No results for input(s): LABURIN in the last 72 hours. Results for  orders placed or performed during the hospital encounter of 12/16/20  Resp Panel by RT-PCR (Flu A&B, Covid) Nasopharyngeal Swab     Status: None   Collection Time: 12/16/20  6:55 PM   Specimen: Nasopharyngeal Swab; Nasopharyngeal(NP) swabs in vial transport medium  Result Value Ref Range Status   SARS Coronavirus 2 by RT PCR NEGATIVE NEGATIVE Final    Comment: (NOTE) SARS-CoV-2 target nucleic acids are NOT DETECTED.  The SARS-CoV-2 RNA is generally detectable in upper respiratory specimens during the acute phase of infection. The lowest concentration of SARS-CoV-2 viral copies this assay can detect is 138 copies/mL. A negative result does not preclude SARS-Cov-2 infection and should not be used as the sole basis for treatment or other patient management decisions. A negative result may occur with  improper specimen collection/handling, submission of specimen other than nasopharyngeal swab, presence of viral mutation(s) within the areas targeted by this assay, and inadequate number of viral copies(<138 copies/mL). A negative result must be combined with clinical observations, patient history, and epidemiological information. The expected result is Negative.  Fact Sheet for Patients:  EntrepreneurPulse.com.au  Fact Sheet for Healthcare Providers:  IncredibleEmployment.be  This test is no t yet approved or cleared by the [place] FDA and  has been authorized for detection and/or diagnosis of SARS-CoV-2 by FDA under an Emergency Use Authorization (EUA). This EUA will remain  in effect (meaning this test can be used) for the duration of the COVID-19 declaration under Section 564(b)(1) of the Act, 21 U.S.C.section 360bbb-3(b)(1), unless the authorization is terminated  or revoked sooner.       Influenza A by PCR NEGATIVE NEGATIVE Final   Influenza B by PCR NEGATIVE NEGATIVE Final    Comment: (NOTE) The Xpert Xpress SARS-CoV-2/FLU/RSV plus  assay is intended as an aid in the diagnosis of influenza from Nasopharyngeal swab specimens and should not be used as a sole basis for treatment. Nasal washings and aspirates are unacceptable for Xpert Xpress SARS-CoV-2/FLU/RSV testing.  Fact Sheet for  Patients: EntrepreneurPulse.com.au  Fact Sheet for Healthcare Providers: IncredibleEmployment.be  This test is not yet approved or cleared by the [place] FDA and has been authorized for detection and/or diagnosis of SARS-CoV-2 by FDA under an Emergency Use Authorization (EUA). This EUA will remain in effect (meaning this test can be used) for the duration of the COVID-19 declaration under Section 564(b)(1) of the Act, 21 U.S.C. section 360bbb-3(b)(1), unless the authorization is terminated or revoked.  Performed at [facility], [place] [address], [place], [place] [address]     Imaging: MR Lumbar Spine 12/16/20 IMPRESSION: 1. 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 No associated pathologic fracture or extra osseous extension of tumor at this time. 2. Retroperitoneal/iliac adenopathy with left larger than right adnexal masses, better characterized on prior CT. 3. Underlying multilevel degenerative spondylosis as above, most pronounced at L4-5 where there is resultant moderate spinal stenosis.     Electronically Signed   By: Jeannine Boga M.D.   On: 12/16/2020 23:44  CT Abd/Pelvis 12/16/20 IMPRESSION: Rapid increase in size of the LEFT ovary with some adjacent ascites. The rapid enlargement is seen in the setting of generalized worsening of masses associated with the LEFT and RIGHT ovary but perhaps more pronounced than other areas in the pelvis. The possibility of torsion associated with an ovarian mass could be considered based on this  finding. Would correlate with any worsening abdominal pain and with sonogram as warranted for further assessment.   Enlarging masses elsewhere though not to the extent that is seen with the LEFT ovary.   Masslike area of the cervix and potentially within a urethral diverticulum not as well seen as on the recent MRI evaluation.   Small volume ascites.   Well-circumscribed LEFT adrenal lesion measuring 3.2 x 2.6 cm, density value of 19 Hounsfield units on previous noncontrast imaging. Relative washout is indeterminate at 30%. This may represent an adrenal adenoma. Consider dedicated adrenal protocol CT or attention on PET evaluation if performed.   Heterogeneous pattern of subtle sclerosis and lucency in the spine on today's study raising the question of bony metastatic disease in the spine.   Findings of increased ovarian size on the LEFT out of proportion other areas were called by telephone at the time of interpretation on 12/16/2020 at 5:44 pm to provider Theodis Blaze , who verbally acknowledged these results.     Electronically Signed   By: Zetta Bills M.D.   On: 12/16/2020 17:46   MR Pelvis 12/16/20 IMPRESSION: 1. Poorly marginated enhancing 3.7 x 2.8 x 3.0 cm mass centered at the urethra, extending from the vesicourethral junction throughout the length of the female urethra, with probable underlying right periurethral diverticulum. This mass is concerning for malignancy. 2. Solid avidly enhancing bilateral ovarian masses, largest 2.3 cm on the right and 8.3 cm on the left, worrisome for bilateral ovarian metastases. 3. Poorly marginated enhancing 2.4 x 2.3 x 3.0 cm mass in the left uterine cervix, which appears to disrupt the normal cervical fibrous stroma without frank parametrial invasion, suspicious for malignancy. 4. Mild-to-moderate bilateral common iliac, bilateral external iliac and bilateral inguinal lymphadenopathy, suspicious for metastatic disease. 5.  Diffuse patchy confluent nodular replacement of the pelvic osseous structures, suspicious for osseous metastatic disease. 6. Mild diffuse uterine adenomyosis.     Electronically Signed   By: Ilona Sorrel M.D.   On: 12/05/2020 20:31   Impression/Recommendations: Urethral mass  with metastatic disease: Discussed proceeding to the operating room with GYN oncology for exam under anesthesia, cystoscopy, bladder biopsy, urethral biopsy, and any other indicated procedures. -Risks and benefits of this procedure were discussed with the patient in detail including but not limited to damage to surrounding structures, bleeding, pain, infection, need for prolonged Foley catheter, need for additional treatment/procedures -Informed consent obtained for cystoscopy, possible bladder biopsy, possible fulguration, urethral biopsy, possible retrograde pyelogram, possible ureteral stents -To OR this morning   Please page with any further questions or concerns. [place] D Issachar Broady

## 2020-12-17 NOTE — Op Note (Signed)
Operative Note  Preoperative diagnosis:  1.  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  same  Procedure(s): 1.  Diagnostic cystoscopy, bladder biopsy, urethral biopsy  Surgeon: Jacalyn Lefevre, MD  Assistants:  None  Anesthesia:  General  Complications:  None  EBL:  minimal  Specimens: 1. Bladder neck biopsy 2. Anterior vaginal wall  Drains/Catheters: 1.  20Fr urethral foley  Intraoperative findings:   Indurated anterior vaginal wall with firm mass at 12 oclock Fixed urethra and bladder neck restricting movement of scope Right ureteral orifice seen and appears uninvolved; unable to visualize left ureteral orifice due to edema/mass/irregular mucosa Bipolar loop used to take biopsy from bladder neck and irregular mucosa just inside bladder Prostate biopsy gun used to take biopsy from anterior vaginal wall/urethra  Indication:  Erin [name] is a 55 y.o. female who initially presented with urinary retention found to have a urethral mass and rapidly progressing metastatic disease.  Description of procedure:  Patient was admitted from the emergency room to medicine for expedited work-up for metastatic malignancy.  After risks, benefits and alternatives of procedure were discussed with the patient in detail informed consent was obtained.  The patient was taken to the operating room placed in supine position.  Anesthesia was induced and antibiotics were administered.  The patient was then repositioned in the dorsolithotomy position.  She was prepped and draped in usual sterile fashion.  A timeout was performed.  Prior to prepping the existing urethral catheter was removed.  Attempts at placing the 21 French rigid cystoscope in the urethral meatus and advanced into the bladder were met with resistance.  A 0.38 sensor wire was then placed in the urethral meatus and advanced easily into the bladder.  The cystoscope was then advanced over the  wire and with some resistance able to traverse the urethra into the bladder.  Findings in the bladder noted above.  A 0.38 sensor wire was then used to cannulate what appeared to be the right ureteral orifice.  The wire easily advanced.  Left ureteral orifice was not visualized due to mucosal changes/edema/mass.  The cystoscope was removed and the resectoscope was then advanced into the bladder.  The bipolar loop was then used to take biopsy of the irregular mucosa of the bladder neck and proximal to trigone.  Hemostasis was achieved with electrocautery with the bipolar loop.  The resectoscope was removed.  Next the prostate biopsy gun was used to take biopsy of the urethra through anterior vaginal wall.  A total of 3 samples were taken this way.  A 20 French Foley catheter was then placed in the urethral meatus and advanced the bladder.  It was inflated with 10 cc of sterile water.  The case was then turned over to GYN oncology.  Plan:  Pt to be admitted to medicine for malignancy work up.

## 2020-12-17 NOTE — Anesthesia Postprocedure Evaluation (Signed)
Anesthesia Post Note  Patient: Erin [name]  Procedure(s) Performed: EXAM UNDER ANESTHESIA with cervical biopsy CYSTOSCOPY with biopsies     Patient location during evaluation: PACU Anesthesia Type: General Level of consciousness: awake and alert Pain management: pain level controlled Vital Signs Assessment: post-procedure vital signs reviewed and stable Respiratory status: spontaneous breathing, nonlabored ventilation, respiratory function stable and patient connected to nasal cannula oxygen Cardiovascular status: blood pressure returned to baseline and stable Postop Assessment: no apparent nausea or vomiting Anesthetic complications: no   No notable events documented.  Last Vitals:  Vitals:   12/17/20 1110 12/17/20 1120  BP:    Pulse: 91 91  Resp: 14 20  Temp: 37 C   SpO2: 98% 94%    Last Pain:  Vitals:   12/17/20 1110  TempSrc:   PainSc: 3                  Jilliane Kazanjian S

## 2020-12-17 NOTE — Anesthesia Procedure Notes (Signed)
Procedure Name: LMA Insertion Date/Time: 12/17/2020 9:13 AM Performed by: Lissa Morales, CRNA Pre-anesthesia Checklist: Patient identified, Emergency Drugs available, Suction available and Patient being monitored Patient Re-evaluated:Patient Re-evaluated prior to induction Oxygen Delivery Method: Circle system utilized Preoxygenation: Pre-oxygenation with 100% oxygen Induction Type: IV induction Ventilation: Mask ventilation without difficulty LMA: LMA with gastric port inserted Tube type: Oral Number of attempts: 1 Placement Confirmation: positive ETCO2 Tube secured with: Tape Dental Injury: Teeth and Oropharynx as per pre-operative assessment

## 2020-12-17 NOTE — Assessment & Plan Note (Signed)
-   Replete as needed 

## 2020-12-17 NOTE — Assessment & Plan Note (Addendum)
-   Presumed due to underlying multiple masses with significant lymphadenopathy and pelvic osseous lesions - Currently pain control is her biggest issue as well - tolerating new regimen: oxycontin and OxyIR, no further dilaudid; will need to still possibly adjust oxycontin once fentanyl patch removed

## 2020-12-17 NOTE — Interval H&P Note (Signed)
History and Physical Interval Note: Recommend admission yesterday due to worsening symptoms to help expedite care. Imaging and tumor markers would suggest may not be GYN malignancy. Plan for cysto with biopsies, EUA and possible cervical biopsies with Urology.  12/17/2020 8:58 AM  Erin [name]  has presented today for surgery, with the diagnosis of metastatic cancer.  The various methods of treatment have been discussed with the patient and family. After consideration of risks, benefits and other options for treatment, the patient has consented to  Procedure(s): EXAM UNDER ANESTHESIA (N/A) CYSTOSCOPY with biopsies (N/A) as a surgical intervention.  The patient's history has been reviewed, patient examined, no change in status, stable for surgery.  I have reviewed the patient's chart and labs.  Questions were answered to the patient's satisfaction.     Lafonda Mosses

## 2020-12-17 NOTE — Progress Notes (Signed)
Called patient's emergency contact, niece Amador Cunas, to update her on findings at the time of surgery. No answer. Unable to leave voicemail.  Jeral Pinch MD Gynecologic Oncology

## 2020-12-17 NOTE — Assessment & Plan Note (Addendum)
-   s/p MRI pelvis on 12/05/20 showing several masses - now s/p cervical, uretheral, and bladder biopsy on 11/8

## 2020-12-17 NOTE — Assessment & Plan Note (Addendum)
-   Initially elevated on admission, has since improved.  Possibly from some uncontrolled pain initially; suspect her spikes are in setting of acute pain - Continue monitoring pressure and will treat further as needed - continue amlodipine and coreg

## 2020-12-17 NOTE — Op Note (Signed)
OPERATIVE NOTE  PATIENT: Erin [name] DATE: 12/17/20  Preop Diagnosis: Urethral mass, bilateral enlarging ovarian masses, suspicious lesions on imaging for bony metastases  Postoperative Diagnosis: same as above - urethral mass extending almost entire length of the urethra, cervix normal in size but firm and somewhat fixed  Surgery: EUA and cervical biopsies  Surgeons:  Valarie Cones MD  Assistant: none  Anesthesia: LMA   Estimated blood loss: 20 ml  IVF:  see I&O flowsheet   Urine output: n/a   Complications: None apparent  Pathology: anterior and posterior cervical biopsies  Operative findings: Induration of anterior vaginal wall, extending from just adjacent to cervix; induration extends further along right lateral vaginal side, does not extend into rectovaginal septum. Due to narrowed vagina given tumor laden anterior vagina, unable to visualize cervix. Cervix without palpable mass (and none seen on vaginoscopy with cystoscope), but firm. Cervix is not contiguous with anterior vaginal wall induration. Blind biopsies taken over my hand with Tischler forceps.   Procedure: The patient was identified in the preoperative holding area. Informed consent was signed on the chart. Patient was seen history was reviewed and exam was performed.   The patient was then taken to the operating room and placed in the supine position with SCD hose on. LMA anesthesia was then induced without difficulty. She was then placed in the dorsolithotomy position. The perineum was prepped with Betadine. The vagina was prepped with Betadine. The patient was then draped after the prep was dried.   Timeout was performed the patient, procedure, antibiotic, allergy, and length of procedure.   After urology had completed cystoscopy and biopsies, I performed and exam with findings noted above. I could not discretely palpate bilateral ovarian masses although there is fullness in the cul de sac.    Multiple attempts were made to visualize the cervix without successful. Biopsies were taken from anterior and posterior cervix with Tischler forceps.  Some bleeding was noted from one of peri-urethral/vaginal biopsy sites. Hemostasis was achieved with monopolar electrocautery.   All instrument, suture, laparotomy, Ray-Tec, and needle counts were correct x2. The patient tolerated the procedure well and was taken recovery room in stable condition.   Lafonda Mosses, MD

## 2020-12-17 NOTE — Progress Notes (Signed)
GYN Oncology Progress Note  Erin [name] is a 55 year old female initially seen by Dr. Jeral Pinch in consultation at the request of Dr. Radene Knee for evaluation of multiple abnormal findings on recent MRI raising the concern for metastatic cancer, possible GYN origin. From the visit, she was scheduled for a PET scan and was scheduled to have a procedure with Urology for cystoscopy and evaluation of a urethral mass. She presented to the ER on 12/16/20 for severe LLQ pain with bilateral lower extremity pain. CT imaging showed rapid increase in size of left ovary with ascites, possible bony metastatic disease to the spine, enlarging masses elsewhere. A pelvic ultrasound was also performed along with a MR of the spine revealing abnormalities of the spine suspicious for diffuse osseous metastatic disease along with retroperitoneal/iliac adenopathy.  This am, she states the pain is still present with little relief from prn medications. No BM reported for 5 days with the use of stool softeners. Passing flatus. Has had decreased appetite with no nausea or emesis. Pain is more in the LLQ abd and is described as constant stabbing. She also reports develop of bilateral leg pain. Urine output has been adequate at home per pt in the foley. Denies dyspnea, chest pain. Has not had anything to drink or eat since midnight.  Exam: Alert, oriented, laying in bed, in no acute distress. Lungs clear. Heart regular in rate and rhythm. Abdomen obese, active bowel sounds, tender with palpation of the LLQ. No lower extremity edema appreciated.  Discussed the plan for this am with the patient. Advised her she would be scheduled to undergo an examination under anesthesia with Dr. Berline Lopes. Dr. Claudia Desanctis with Urology will also be involved in her procedure as well for cystoscopy with biopsies. She verbalizes understanding. No concerns voiced. Order for consent for Dr. Charisse March procedure placed.

## 2020-12-17 NOTE — Hospital Course (Addendum)
Erin [name] is a 55 y.o. female with PMH OSA on CPAP, HTN, DMII, morbid obesity who presented with worsening abdominal pain and left thigh pain.   She has been undergoing workup for recently discovered cervical/urethral masses on CT in Sept 2022.  Further imaging showed ongoing interval enlargement of the left adnexal area.  There was also associated retroperitoneal and pelvic adenopathy. MRI pelvis also performed on 12/05/2020 showed poorly enhancing mass centered at the urethra, solid enhancing bilateral ovarian masses, poorly enhancing mass involving left uterine cervix, and significant adenopathy involving bilateral common iliac, bilateral external iliac, bilateral inguinal lymph nodes.  Also noted to have pelvic osseous lesions concerning for metastatic disease as well.  She was admitted for biopsy with urology and GYN oncology.  She was able to undergo cystoscopy with urethral and bladder biopsy on 12/17/2020.  She also underwent cervical biopsies following this procedure.  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

## 2020-12-17 NOTE — Assessment & Plan Note (Addendum)
-   Underwent colon prep successfully on repeat prepping -Continue laxative regimen in setting of underlying opioid use

## 2020-12-18 ENCOUNTER — Other Ambulatory Visit: Payer: Self-pay

## 2020-12-18 ENCOUNTER — Encounter (HOSPITAL_COMMUNITY): Payer: Self-pay | Admitting: Gynecologic Oncology

## 2020-12-18 DIAGNOSIS — R970 Elevated carcinoembryonic antigen [CEA]: Secondary | ICD-10-CM

## 2020-12-18 DIAGNOSIS — E440 Moderate protein-calorie malnutrition: Secondary | ICD-10-CM | POA: Insufficient documentation

## 2020-12-18 LAB — CBC WITH DIFFERENTIAL/PLATELET
Abs Immature Granulocytes: 0.5 10*3/uL — ABNORMAL HIGH (ref 0.00–0.07)
Basophils Absolute: 0 10*3/uL (ref 0.0–0.1)
Basophils Relative: 0 %
Eosinophils Absolute: 0 10*3/uL (ref 0.0–0.5)
Eosinophils Relative: 0 %
HCT: 29.5 % — ABNORMAL LOW (ref 36.0–46.0)
Hemoglobin: 9.4 g/dL — ABNORMAL LOW (ref 12.0–15.0)
Immature Granulocytes: 4 %
Lymphocytes Relative: 10 %
Lymphs Abs: 1.1 10*3/uL (ref 0.7–4.0)
MCH: 29.8 pg (ref 26.0–34.0)
MCHC: 31.9 g/dL (ref 30.0–36.0)
MCV: 93.7 fL (ref 80.0–100.0)
Monocytes Absolute: 0.7 10*3/uL (ref 0.1–1.0)
Monocytes Relative: 6 %
Neutro Abs: 9.4 10*3/uL — ABNORMAL HIGH (ref 1.7–7.7)
Neutrophils Relative %: 80 %
Platelets: 230 10*3/uL (ref 150–400)
RBC: 3.15 MIL/uL — ABNORMAL LOW (ref 3.87–5.11)
RDW: 13.9 % (ref 11.5–15.5)
WBC: 11.9 10*3/uL — ABNORMAL HIGH (ref 4.0–10.5)
nRBC: 0.3 % — ABNORMAL HIGH (ref 0.0–0.2)

## 2020-12-18 LAB — BASIC METABOLIC PANEL
Anion gap: 9 (ref 5–15)
BUN: 21 mg/dL — ABNORMAL HIGH (ref 6–20)
CO2: 27 mmol/L (ref 22–32)
Calcium: 9.3 mg/dL (ref 8.9–10.3)
Chloride: 96 mmol/L — ABNORMAL LOW (ref 98–111)
Creatinine, Ser: 0.76 mg/dL (ref 0.44–1.00)
GFR, Estimated: >60 mL/min (ref >60)
Glucose, Bld: 139 mg/dL — ABNORMAL HIGH (ref 70–99)
Potassium: 3.8 mmol/L (ref 3.5–5.1)
Sodium: 132 mmol/L — ABNORMAL LOW (ref 135–145)

## 2020-12-18 LAB — GLUCOSE, CAPILLARY
Glucose-Capillary: 125 mg/dL — ABNORMAL HIGH (ref 70–99)
Glucose-Capillary: 156 mg/dL — ABNORMAL HIGH (ref 70–99)
Glucose-Capillary: 138 mg/dL — ABNORMAL HIGH (ref 70–99)
Glucose-Capillary: 190 mg/dL — ABNORMAL HIGH (ref 70–99)

## 2020-12-18 LAB — MAGNESIUM: Magnesium: 2.3 mg/dL (ref 1.7–2.4)

## 2020-12-18 MED ORDER — ENSURE MAX PROTEIN PO LIQD
11.0000 [oz_av] | Freq: Two times a day (BID) | ORAL | Status: DC
  Administered 2020-12-18 – 2020-12-23 (×6): 11 [oz_av] via ORAL

## 2020-12-18 MED ORDER — LACTULOSE 10 GM/15ML PO SOLN: 20.0000 g | Freq: Two times a day (BID) | ORAL | Status: DC | PRN

## 2020-12-18 MED ORDER — FENTANYL 25 MCG/HR TD PT72
1.0000 | MEDICATED_PATCH | TRANSDERMAL | Status: DC
  Administered 2020-12-18: 1 via TRANSDERMAL
  Filled 2020-12-18: qty 1

## 2020-12-18 MED ORDER — ADULT MULTIVITAMIN W/MINERALS CH
1.0000 | ORAL_TABLET | Freq: Every day | ORAL | Status: DC
  Administered 2020-12-19 – 2020-12-27 (×9): 1 via ORAL
  Filled 2020-12-18 (×9): qty 1

## 2020-12-18 NOTE — Progress Notes (Signed)
GYN Oncology Progress Note  Erin [name] is a 55 year old female initially seen by Dr. Jeral Pinch in consultation at the request of Dr. Radene Knee for evaluation of multiple abnormal findings on recent MRI raising the concern for metastatic cancer, possible GYN origin. From the visit, she was scheduled for a PET scan and was scheduled to have a procedure with Urology for cystoscopy and evaluation of a urethral mass. She presented to the ER on 12/16/20 for severe LLQ pain with bilateral lower extremity pain. CT imaging showed rapid increase in size of left ovary with ascites, possible bony metastatic disease to the spine, enlarging masses elsewhere. A pelvic ultrasound was also performed along with a MR of the spine revealing abnormalities of the spine suspicious for diffuse osseous metastatic disease along with retroperitoneal/iliac adenopathy. On 12/17/2020, she underwent an examination under anesthesia, diagnostic cystoscopy, bladder and urethral biopsy.   Patient seen today by Dr. Berline Lopes and I. She continues to report pain in the left lower abd and leg. She states the prn medications are helping some. No BM reported for 6 days and states she had a dose of miralax this am. She has taken in solid food since her procedure with no nausea or emesis.    Exam: Alert, oriented when awake, appears sleeping, laying in bed, in no acute distress. Lungs clear. Heart regular in rate and rhythm. Abdomen obese, active bowel sounds, tender with palpation of the LLQ. No lower extremity edema appreciated.  Surgical findings discussed with the patient by Dr. Berline Lopes. Advised patient we are waiting for the biopsy results. She verbalizes understanding. No concerns voiced. GYN Onc will continue to follow. Dr. Berline Lopes is recommending Palliative Care consultation for pain management/symptom management related to advanced cancer. She is also recommending GI consultation given elevated CEA at 1,864.09, severe constipation with  recommendation for colonoscopy in the near future. The patient had a colonoscopy last in 2019 and is due to one given findings at the time of that scope.

## 2020-12-18 NOTE — Assessment & Plan Note (Addendum)
-   patient being followed by urology and GYN onc initially. Now with biopsy results back, GI and med onc on board - CEA 1864 and CA-125 is 29 on admission

## 2020-12-18 NOTE — Assessment & Plan Note (Signed)
-   Moderate malnutrition in context of acute illness/injury, morbid obesity - Evaluated by dietitian, appreciate assistance - per RD: "Moderate Malnutrition related to acute illness as evidenced by percent weight loss, moderate muscle depletion, energy intake < 75% for > 7 days."

## 2020-12-18 NOTE — Progress Notes (Signed)
1 Day Post-Op Subjective: Pain controlled, no nausea or emesis. Tolerating foley.  Objective: Vital signs in last 24 hours: Temp:  [97.5 F (36.4 C)-99.5 F (37.5 C)] 97.8 F (36.6 C) (11/09 0916) Pulse Rate:  [80-100] 97 (11/09 0916) Resp:  [18] 18 (11/09 0916) BP: (115-186)/(60-100) 156/99 (11/09 0916) SpO2:  [91 %-100 %] 99 % (11/09 0916) FiO2 (%):  [2 %] 2 % (11/09 0544)  Intake/Output from previous day: 11/08 0701 - 11/09 0700 In: 1630 [P.O.:630; I.V.:1000] Out: 2500 [Urine:2500] Intake/Output this shift: Total I/O In: 120 [P.O.:120] Out: 200 [Urine:200]  Physical Exam:  General: Alert and oriented CV: RRR Lungs: Clear Abdomen: Soft, ND, NT Ext: NT, No erythema  Lab Results: Recent Labs    12/16/20 1345 12/17/20 0400 12/18/20 0417  HGB 10.7* 10.3* 9.4*  HCT 31.8* 30.6* 29.5*   BMET Recent Labs    12/17/20 0400 12/18/20 0417  NA 131* 132*  K 3.5 3.8  CL 93* 96*  CO2 27 27  GLUCOSE 167* 139*  BUN 23* 21*  CREATININE 0.85 0.76  CALCIUM 9.2 9.3     Studies/Results: DG Chest 2 View  Result Date: 12/16/2020 CLINICAL DATA:  Shortness of breath in a 55 year old female. EXAM: CHEST - 2 VIEW COMPARISON:  July 10, 2009. FINDINGS: Linear opacities in the LEFT chest of progressed slightly since the previous study. No lobar consolidation. Cardiomediastinal contours and hilar structures are stable. No sign of effusion. Mildly increased density in the subcarinal region on lateral projection. No acute skeletal process on limited assessment. IMPRESSION: Signs of scarring or atelectasis in the LEFT chest, slightly progressed since the previous study. Question developing subcarinal adenopathy, based on lateral projection. Developing lower lobe airspace disease is also a differential consideration. In a patient with pelvic masses on recent pelvic MRI would consider follow-up chest CT for further evaluation. Electronically Signed   By: Zetta Bills M.D.   On: 12/16/2020  14:13   CT CHEST W CONTRAST  Result Date: 12/17/2020 CLINICAL DATA:  Cancer of unknown primary.  Staging. EXAM: CT CHEST WITH CONTRAST TECHNIQUE: Multidetector CT imaging of the chest was performed during intravenous contrast administration. CONTRAST:  69mL OMNIPAQUE IOHEXOL 350 MG/ML SOLN COMPARISON:  Chest CT dated 06/01/2007. FINDINGS: Cardiovascular: There is no cardiomegaly or pericardial effusion. Three-vessel coronary vascular calcification. Mild atherosclerotic calcification of the thoracic aorta. No aneurysmal dilatation. The origins of the great vessels of the aortic arch appear patent as visualized. There is dilatation of the main pulmonary trunk suggestive of pulmonary hypertension. Evaluation of the pulmonary arteries is limited due to respiratory motion artifact and suboptimal opacification and timing of the contrast. No large or central pulmonary artery embolus identified. Mediastinum/Nodes: No hilar or mediastinal adenopathy. The esophagus is grossly unremarkable. Prior right hemithyroidectomy. No mediastinal fluid collection. Lungs/Pleura: Bibasilar subpleural atelectasis/scarring. There is no pleural effusion pneumothorax. The central airways are patent. Upper Abdomen: Indeterminate 3 cm left renal nodule, present on the prior CT, likely a benign etiology such as adenoma. Musculoskeletal: Degenerative changes of the spine. No acute osseous pathology. IMPRESSION: 1. No acute intrathoracic pathology. No CT evidence of central pulmonary artery embolus. 2. Dilatation of the main pulmonary trunk suggestive of pulmonary hypertension. 3. Status post prior right hemithyroidectomy. 4. Bilateral linear atelectasis/scarring. 5. Aortic Atherosclerosis (ICD10-I70.0). Electronically Signed   By: Anner Crete M.D.   On: 12/17/2020 21:09   MR Lumbar Spine W Wo Contrast  Result Date: 12/16/2020 CLINICAL DATA:  Initial evaluation for low back pain, cancer suspected. EXAM:  MRI LUMBAR SPINE WITHOUT AND  WITH CONTRAST TECHNIQUE: Multiplanar and multiecho pulse sequences of the lumbar spine were obtained without and with intravenous contrast. CONTRAST:  5mL GADAVIST GADOBUTROL 1 MMOL/ML IV SOLN COMPARISON:  Prior CT from earlier the same day. FINDINGS: Segmentation: Standard. Lowest well-formed disc space labeled the L5-S1 level. Alignment: 4 mm facet mediated anterolisthesis of L4 on L5. Alignment otherwise normal with preservation of the normal lumbar lordosis. Vertebrae: Diffusely abnormal appearance of the bone marrow is seen throughout the visualized lumbar spine and pelvis. Associated heterogeneous STIR hyperintensity with irregular heterogeneous postcontrast enhancement. Findings are highly suspicious for diffuse osseous metastatic disease. Involvement appears to be most pronounced within the T12 vertebral body as well as the visualized pelvis. Exact measurements of a discrete lesion is difficult given the overall infiltrative appearance of this finding. No associated pathologic fracture. No visible extra osseous extension of tumor at this time. Conus medullaris and cauda equina: Conus extends to the L1 level. Conus and cauda equina appear normal. No visible epidural or intracanalicular tumor. Paraspinal and other soft tissues: Mild edema within the subcutaneous fat of the lower back, which could be related to overall volume status. Paraspinous soft tissues demonstrate no other acute finding. Retroperitoneal/iliac adenopathy noted. Left larger than right adnexal masses partially visualized. Findings better characterized on prior CT. Disc levels: L1-2:  Unremarkable. L2-3: Disc desiccation with mild disc bulge. Superimposed small right foraminal to extraforaminal disc protrusion (series 11, image 17). Mild facet hypertrophy. Underlying short pedicles with a degree of mild spinal stenosis. Mild bilateral L2 foraminal narrowing. L3-4: Disc desiccation with mild disc bulge. Superimposed shallow left  extraforaminal disc protrusion with annular fissure (series 11, image 24). Mild facet hypertrophy. Underlying short pedicles. Mild spinal stenosis. Foramina remain patent. L4-5: 4 mm anterolisthesis. Disc desiccation with broad-based posterior pseudo disc bulge. Biforaminal annular fissures noted, left larger than right. Moderate bilateral facet arthrosis. Resultant moderate spinal stenosis. Mild to moderate bilateral L4 foraminal narrowing. L5-S1: Normal interspace. Mild right greater than left facet hypertrophy. No stenosis. IMPRESSION: 1. 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 No associated pathologic fracture or extra osseous extension of tumor at this time. 2. Retroperitoneal/iliac adenopathy with left larger than right adnexal masses, better characterized on prior CT. 3. Underlying multilevel degenerative spondylosis as above, most pronounced at L4-5 where there is resultant moderate spinal stenosis. Electronically Signed   By: Jeannine Boga M.D.   On: 12/16/2020 23:44   US Pelvis Complete  Result Date: 12/16/2020 CLINICAL DATA:  Pelvic pain EXAM: TRANSABDOMINAL ULTRASOUND OF PELVIS DOPPLER ULTRASOUND OF OVARIES TECHNIQUE: Transabdominal ultrasound examination of the pelvis was performed including evaluation of the uterus, ovaries, adnexal regions, and pelvic cul-de-sac. Color and duplex Doppler ultrasound was utilized to evaluate blood flow to the ovaries. COMPARISON:  CT 12/16/2020, MRI 12/05/2020, pelvic ultrasound 12/01/2020 FINDINGS: Uterus Measurements: 6.8 x 3.4 x 5 cm = volume: 60.5 mL. No fibroids or other mass visualized. Endometrium Poorly visible, unable to measure. Right ovary Measurements: 4.8 x 2.9 x 5.5 cm = volume: 38.6 mL. Solid right adnexal mass replaces most of right ovary. Left ovary Measurements: 10.5 x 8.7 x 9.2 cm = volume: 440 mL. Enlarged left ovary with  heterogenous solid mass that replaces the left ovary. Previously the left ovary measured 8.3 x 7.7 x 8.5 cm on MRI. Pulsed Doppler evaluation demonstrates normal arterial and venous waveforms in both or adnexa, though note that  both ovaries are essentially replaced by mass lesions. Other: No significant ascites. Foley catheter within the bladder which appears somewhat thick walled. IMPRESSION: 1. Very limited exam secondary to habitus and only trans abdominal technique 2. Bilateral solid adnexal masses essentially replacing the ovaries which thereby limits assessment of the ovaries. Left adnexal solid mass lesion has significantly increased in size; documented flow in the adnexa relates to flow within the solid adnexal masses. Gynecology follow-up recommended. Electronically Signed   By: Donavan Foil M.D.   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 EXAM: CT ABDOMEN AND PELVIS WITH CONTRAST TECHNIQUE: Multidetector CT imaging of the abdomen and pelvis was performed using the standard protocol following bolus administration of intravenous contrast. CONTRAST:  18mL OMNIPAQUE IOHEXOL 350 MG/ML SOLN COMPARISON:  Comparison is made with multiple prior studies which were performed recently, most recent comparison is an MRI of the pelvis of December 05, 2020. FINDINGS: Lower chest: Basilar atelectasis. Subcarinal region not well evaluated. No consolidation or effusion at the lung bases. Hepatobiliary: No focal, suspicious hepatic lesion. No pericholecystic stranding. Portal vein is patent. No biliary duct dilation. Pancreas: Pancreas with normal contours. No signs of adjacent inflammation. Spleen: Spleen normal size and contour. Adrenals/Urinary Tract: Well-circumscribed LEFT adrenal lesion, density value of 19 Hounsfield units on previous noncontrast imaging  measuring 3.2 x 2.6 cm., relative washout is indeterminate at 30%. Normal RIGHT adrenal. Normal, symmetric enhancement of bilateral kidneys without focal renal lesion. Stomach/Bowel: Small hiatal hernia. No stranding adjacent to the stomach. No sign of small bowel obstruction or acute small bowel process. The appendix is normal. Stool in various parts of the colon without signs of obstruction or adjacent stranding. Vascular/Lymphatic: Retroperitoneal adenopathy (image 50/2) 14 mm lymph node previously 11 mm along the LEFT common iliac chain. LEFT pelvic sidewall lymph node (image 75/2) 14 mm short axis, previously 13 mm. Similar size of RIGHT pelvic sidewall/external iliac lymph nodes also with little change compared to the most recent comparison imaging. Reproductive: Foley catheter in situ. Uterus and cervix not well at assess nor are the masses within the area of the cervix and urethra seen on previous MRI. Increasing size of LEFT ovary with heterogeneous appearance now measuring 10 x 8.8 cm. When assessed on the study of October 20, 2020 the ovary for reference measured 3.7 x 2.5 cm. RIGHT ovary with area of enhancement shows enlargement as well but not as pronounced as on previous imaging with 2.8 cm area of enhancement in the RIGHT ovary as the largest discrete area which is similar to the recent MRI evaluation. As compared to the study of October 20, 2020 there is little change in the appearance of the RIGHT ovary aside from this area though there is an increasingly nodular appearance of the ovary in this location. Mass posterior to the LEFT ovary just above the uterus measuring 4.4 x 2.8 cm has enlarged from 2.5 cm. Other: Small volume ascites. Musculoskeletal: Heterogeneous pattern associated with the spine in terms of density raises the question of metastatic disease without discrete measurable focal lesion in the spine. For example a geographic area of sclerosis on image 101/5) in the anterior L3  vertebral body does raise the question of underlying lesion with numerous additional foci, essentially nearly all levels of the spine with some degree of heterogeneity. Another example of this finding is noted on  the RIGHT at the T10 level. No acute bone finding or frankly destructive bone process. IMPRESSION: Rapid increase in size of the LEFT ovary with some adjacent ascites. The rapid enlargement is seen in the setting of generalized worsening of masses associated with the LEFT and RIGHT ovary but perhaps more pronounced than other areas in the pelvis. The possibility of torsion associated with an ovarian mass could be considered based on this finding. Would correlate with any worsening abdominal pain and with sonogram as warranted for further assessment. Enlarging masses elsewhere though not to the extent that is seen with the LEFT ovary. Masslike area of the cervix and potentially within a urethral diverticulum not as well seen as on the recent MRI evaluation. Small volume ascites. Well-circumscribed LEFT adrenal lesion measuring 3.2 x 2.6 cm, density value of 19 Hounsfield units on previous noncontrast imaging. Relative washout is indeterminate at 30%. This may represent an adrenal adenoma. Consider dedicated adrenal protocol CT or attention on PET evaluation if performed. Heterogeneous pattern of subtle sclerosis and lucency in the spine on today's study raising the question of bony metastatic disease in the spine. Findings of increased ovarian size on the LEFT out of proportion other areas were called by telephone at the time of interpretation on 12/16/2020 at 5:44 pm to provider Theodis Blaze , who verbally acknowledged these results. Electronically Signed   By: Zetta Bills M.D.   On: 12/16/2020 17:46   [place] Art/Ven Flow Abd Pelv Doppler  Result Date: 12/16/2020 CLINICAL DATA:  Pelvic pain EXAM: TRANSABDOMINAL ULTRASOUND OF PELVIS DOPPLER ULTRASOUND OF OVARIES TECHNIQUE: Transabdominal ultrasound  examination of the pelvis was performed including evaluation of the uterus, ovaries, adnexal regions, and pelvic cul-de-sac. Color and duplex Doppler ultrasound was utilized to evaluate blood flow to the ovaries. COMPARISON:  CT 12/16/2020, MRI 12/05/2020, pelvic ultrasound 12/01/2020 FINDINGS: Uterus Measurements: 6.8 x 3.4 x 5 cm = volume: 60.5 mL. No fibroids or other mass visualized. Endometrium Poorly visible, unable to measure. Right ovary Measurements: 4.8 x 2.9 x 5.5 cm = volume: 38.6 mL. Solid right adnexal mass replaces most of right ovary. Left ovary Measurements: 10.5 x 8.7 x 9.2 cm = volume: 440 mL. Enlarged left ovary with heterogenous solid mass that replaces the left ovary. Previously the left ovary measured 8.3 x 7.7 x 8.5 cm on MRI. Pulsed Doppler evaluation demonstrates normal arterial and venous waveforms in both or adnexa, though note that both ovaries are essentially replaced by mass lesions. Other: No significant ascites. Foley catheter within the bladder which appears somewhat thick walled. IMPRESSION: 1. Very limited exam secondary to habitus and only trans abdominal technique 2. Bilateral solid adnexal masses essentially replacing the ovaries which thereby limits assessment of the ovaries. Left adnexal solid mass lesion has significantly increased in size; documented flow in the adnexa relates to flow within the solid adnexal masses. Gynecology follow-up recommended. Electronically Signed   By: Donavan Foil M.D.   On: 12/16/2020 19:23    Assessment/Plan: Urethral mass: MRI pelvis 12/05/2020 with 3xcm urethral mass extending from vesicourethral junction throughout length of urethra. Lumbar MRI 12/16/2020 with diffuse osseous metastatic disease, RP/iliac adenopathy. S/p cysto/biopsy 12/17/2020 Bilateral ovarian masses, worrisome for bilateral ovarian metastasis Cervical mass  -Leave foley in place for 3 days given fixed urethra -F/u pathology. Once pathology results, will engage  med/onc -Agree with palliative evaluation    LOS: 2 days   Matt R. Omarii Scalzo MD 12/18/2020, 11:47 AM [facility]  Pager: [phone]

## 2020-12-18 NOTE — Progress Notes (Signed)
Initial Nutrition Assessment  DOCUMENTATION CODES:   Non-severe (moderate) malnutrition in context of acute illness/injury, Morbid obesity  INTERVENTION:   -Ensure MAX Protein po BID, each supplement provides 150 kcal and 30 grams of protein   -Multivitamin with minerals daily  NUTRITION DIAGNOSIS:   Moderate Malnutrition related to acute illness as evidenced by percent weight loss, moderate muscle depletion, energy intake < 75% for > 7 days.  GOAL:   Patient will meet greater than or equal to 90% of their needs  MONITOR:   PO intake, Supplement acceptance, Labs, Weight trends, I & O's  REASON FOR ASSESSMENT:   Malnutrition Screening Tool    ASSESSMENT:   55 y.o. female with PMH OSA on CPAP, HTN, DMII, morbid obesity who presented with worsening abdominal pain and left thigh pain.She has been undergoing workup for recently discovered cervical/urethral masses on CT in Sept 2022.  Patient reports poor appetite for 3 weeks now. Has noticed decreases in energy and weakness in doing her ADLs. Unable to cook food for herself and depending on easy to fix foods such as pasta and microwaveable meals. Eating only 1 meal daily. Has lost her taste for some foods but denies taste changes. Denies issues with swallowing and chewing.   Pt has not had a BM for 5-6 days. On bowel regimen. Pt states she has had issues with constipation since 2019 after a colonoscopy.  Per weight records, pt has lost 14 lbs since 10/23 (4% wt loss x 2.5 weeks, significant for time frame).   Medications: Miralax, Senokot, Sorbitol infusion  Labs reviewed:  CBGs: 125-156 Low Na  NUTRITION - FOCUSED PHYSICAL EXAM:  Flowsheet Row Most Recent Value  Orbital Region No depletion  Upper Arm Region No depletion  Thoracic and Lumbar Region No depletion  Buccal Region No depletion  Temple Region No depletion  Clavicle Bone Region No depletion  Clavicle and Acromion Bone Region No depletion  Scapular Bone  Region Moderate depletion  Dorsal Hand Moderate depletion  Patellar Region Mild depletion  Anterior Thigh Region Mild depletion  Posterior Calf Region Mild depletion  Edema (RD Assessment) None  Hair Reviewed  Eyes Reviewed  Mouth Reviewed  Skin Reviewed       Diet Order:   Diet Order             Diet Carb Modified Fluid consistency: Thin; Room service appropriate? Yes  Diet effective now                   EDUCATION NEEDS:   No education needs have been identified at this time  Skin:  Skin Assessment: Reviewed RN Assessment  Last BM:  PTA  Height:   Ht Readings from Last 1 Encounters:  12/17/20 5\' 4"  (1.626 m)    Weight:   Wt Readings from Last 1 Encounters:  12/17/20 127 kg    BMI:  Body mass index is 48.06 kg/m.  Estimated Nutritional Needs:   Kcal:  2150-2350  Protein:  85-100g  Fluid:  2.1L/day  Clayton Bibles, MS, RD, LDN Inpatient Clinical Dietitian Contact information available via Amion

## 2020-12-18 NOTE — Progress Notes (Signed)
Progress Note    Erin [name]   [id number]  DOB: 31-Mar-1965  DOA: 12/16/2020     2 PCP: Pcp, No  Initial CC: abdominal pain  Hospital Course: Erin [name] is a 55 y.o. female with PMH OSA on CPAP, HTN, DMII, morbid obesity who presented with worsening abdominal pain and left thigh pain.   She has been undergoing workup for recently discovered cervical/urethral masses on CT in Sept 2022.  Further imaging showed ongoing interval enlargement of the left adnexal area.  There was also associated retroperitoneal and pelvic adenopathy. MRI pelvis also performed on 12/05/2020 showed poorly enhancing mass centered at the urethra, solid enhancing bilateral ovarian masses, poorly enhancing mass involving left uterine cervix, and significant adenopathy involving bilateral common iliac, bilateral external iliac, bilateral inguinal lymph nodes.  Also noted to have pelvic osseous lesions concerning for metastatic disease as well.  She was admitted for biopsy with urology and GYN oncology.  She was able to undergo cystoscopy with urethral and bladder biopsy on 12/17/2020.  She also underwent cervical biopsies following this procedure.  Interval History:  No events overnight.  Still having ongoing pain in her lower abdomen and left thigh.  We discussed addition of fentanyl patch today and further adjustments as needed.  Also discussed involvement of palliative care and possibly GI depending on biopsy results.  She is still hoping for a "curable treatment".  Assessment & Plan: * Mass of urethra - s/p MRI pelvis on 12/05/20 -Biopsy taken during cystoscopy on 12/17/2020 - Follow-up biopsy results as able - Continue Foley in place  Elevated CEA - patient being followed by urology and GYN onc currently - CEA 1864 and CA-125 is 29 on admission - trying to await biopsy results for type of malignancy then will consult GI at that time depending on need  Ovarian mass - s/p MRI pelvis on 12/05/20  showing several masses - now s/p cervical, uretheral, and bladder biopsy on 11/8 - large concern for obvious malignancy; primary to be determined but appears to have significant mets; PET pending outpatient  Abdominal pain - Presumed due to underlying multiple masses with significant lymphadenopathy and pelvic osseous lesions - Currently pain control is her biggest issue as well - Increased Dilaudid and increase oxycodone doses.  May need to convert to extended release OxyContin at some point - adding fentanyl patch today; will adjust as needed as well - I also brought up bringing palliative care on board soon for not just pain management assistance but also help with Vici discussions; she's still hoping for a "curable treatment" and therefore I think letting her process further another 24 hours before involvement will make some of the transition easier as well  Constipation - patient endorses no BM in 5-6 days; uses laxatives at home to try and stay regular - at higher risk with opioids - continue bowel regimen ; add lactulose today   Malnutrition of moderate degree - Moderate malnutrition in context of acute illness/injury, morbid obesity - Evaluated by dietitian, appreciate assistance - per RD: "Moderate Malnutrition related to acute illness as evidenced by percent weight loss, moderate muscle depletion, energy intake < 75% for > 7 days."  Hypokalemia - Replete as needed  OSA (obstructive sleep apnea) - Continue nightly CPAP  Diabetes mellitus ([place]) - continue SSI and CBG monitoring   Essential hypertension - Initially elevated on admission, has since improved.  Possibly from some uncontrolled pain initially - Continue monitoring pressure and will treat further as needed  Old records reviewed in assessment of this patient  Antimicrobials:   DVT prophylaxis: Lovenox  Code Status:   Code Status: Full Code  Disposition Plan: Status is: Inpatient  Remains inpatient  appropriate because: Ongoing treatment of above   Objective: Blood pressure (!) 159/90, pulse 98, temperature 98.9 F (37.2 C), temperature source Oral, resp. rate 18, height _0  (1.626 m), weight 127 kg, SpO2 99 %.  Examination:  Physical Exam Exam conducted with a chaperone present.  Constitutional:      General: She is not in acute distress.    Appearance: She is well-developed. She is obese.     Comments: Uncomfortable appearing  HENT:     Head: Normocephalic and atraumatic.     Mouth/Throat:     Mouth: Mucous membranes are moist.  Eyes:     Extraocular Movements: Extraocular movements intact.  Cardiovascular:     Rate and Rhythm: Normal rate and regular rhythm.  Pulmonary:     Effort: Pulmonary effort is normal.     Breath sounds: Normal breath sounds.  Abdominal:     Comments: Obese, slightly distended, hypoactive bowel sounds, nonspecific tenderness but worse in lower quadrants  Genitourinary:    Comments: Blood noted on vaginal pad Musculoskeletal:        General: Normal range of motion.     Cervical back: Normal range of motion and neck supple.  Skin:    General: Skin is warm and dry.  Neurological:     General: No focal deficit present.     Mental Status: She is alert.  Psychiatric:        Mood and Affect: Mood normal.        Behavior: Behavior normal.     Consultants:  Urology GYN oncology  Procedures:  Diagnostic cystoscopy, bladder biopsy, urethral biopsy, 12/17/20 Cervical biopsies, 12/17/2020  Data Reviewed: I have personally reviewed labs and imaging studies     LOS: 2 days  Time spent: Greater than 50% of the 35 minute visit was spent in counseling/coordination of care for the patient as laid out in the A&P.   Erin Dee, MD [facility] 12/18/2020, 4:25 PM

## 2020-12-19 ENCOUNTER — Other Ambulatory Visit: Payer: Self-pay

## 2020-12-19 LAB — CBC WITH DIFFERENTIAL/PLATELET
Abs Immature Granulocytes: 0.54 10*3/uL — ABNORMAL HIGH (ref 0.00–0.07)
Basophils Absolute: 0 10*3/uL (ref 0.0–0.1)
Basophils Relative: 0 %
Eosinophils Absolute: 0 10*3/uL (ref 0.0–0.5)
Eosinophils Relative: 0 %
HCT: 29 % — ABNORMAL LOW (ref 36.0–46.0)
Hemoglobin: 9.6 g/dL — ABNORMAL LOW (ref 12.0–15.0)
Immature Granulocytes: 4 %
Lymphocytes Relative: 8 %
Lymphs Abs: 1 10*3/uL (ref 0.7–4.0)
MCH: 30.6 pg (ref 26.0–34.0)
MCHC: 33.1 g/dL (ref 30.0–36.0)
MCV: 92.4 fL (ref 80.0–100.0)
Monocytes Absolute: 0.9 10*3/uL (ref 0.1–1.0)
Monocytes Relative: 7 %
Neutro Abs: 10.1 10*3/uL — ABNORMAL HIGH (ref 1.7–7.7)
Neutrophils Relative %: 81 %
Platelets: 243 10*3/uL (ref 150–400)
RBC: 3.14 MIL/uL — ABNORMAL LOW (ref 3.87–5.11)
RDW: 13.7 % (ref 11.5–15.5)
WBC: 12.6 10*3/uL — ABNORMAL HIGH (ref 4.0–10.5)
nRBC: 0.2 % (ref 0.0–0.2)

## 2020-12-19 LAB — COMPREHENSIVE METABOLIC PANEL
ALT: 15 U/L (ref 0–44)
AST: 35 U/L (ref 15–41)
Albumin: 2.1 g/dL — ABNORMAL LOW (ref 3.5–5.0)
Alkaline Phosphatase: 799 U/L — ABNORMAL HIGH (ref 38–126)
Anion gap: 8 (ref 5–15)
BUN: 25 mg/dL — ABNORMAL HIGH (ref 6–20)
CO2: 29 mmol/L (ref 22–32)
Calcium: 9.5 mg/dL (ref 8.9–10.3)
Chloride: 95 mmol/L — ABNORMAL LOW (ref 98–111)
Creatinine, Ser: 0.81 mg/dL (ref 0.44–1.00)
GFR, Estimated: >60 mL/min (ref >60)
Glucose, Bld: 142 mg/dL — ABNORMAL HIGH (ref 70–99)
Potassium: 4.2 mmol/L (ref 3.5–5.1)
Sodium: 132 mmol/L — ABNORMAL LOW (ref 135–145)
Total Bilirubin: 0.7 mg/dL (ref 0.3–1.2)
Total Protein: 6.6 g/dL (ref 6.5–8.1)

## 2020-12-19 LAB — GLUCOSE, CAPILLARY
Glucose-Capillary: 150 mg/dL — ABNORMAL HIGH (ref 70–99)
Glucose-Capillary: 175 mg/dL — ABNORMAL HIGH (ref 70–99)
Glucose-Capillary: 142 mg/dL — ABNORMAL HIGH (ref 70–99)
Glucose-Capillary: 141 mg/dL — ABNORMAL HIGH (ref 70–99)

## 2020-12-19 LAB — MAGNESIUM: Magnesium: 2.4 mg/dL (ref 1.7–2.4)

## 2020-12-19 MED ORDER — BISACODYL 10 MG RE SUPP
10.0000 mg | Freq: Every day | RECTAL | Status: DC
  Administered 2020-12-19: 10 mg via RECTAL
  Filled 2020-12-19 (×2): qty 1

## 2020-12-19 MED ORDER — CARISOPRODOL 350 MG PO TABS
350.0000 mg | ORAL_TABLET | Freq: Three times a day (TID) | ORAL | Status: DC
  Administered 2020-12-19 – 2020-12-21 (×8): 350 mg via ORAL
  Filled 2020-12-19 (×8): qty 1

## 2020-12-19 MED ORDER — LACTULOSE 10 GM/15ML PO SOLN
20.0000 g | Freq: Two times a day (BID) | ORAL | Status: DC
  Administered 2020-12-19 – 2020-12-21 (×3): 20 g via ORAL
  Filled 2020-12-19 (×5): qty 30

## 2020-12-19 NOTE — Progress Notes (Signed)
Progress Note    Erin [name]   [id number]  DOB: Jul 26, 1965  DOA: 12/16/2020     3 PCP: Pcp, No  Initial CC: abdominal pain  Hospital Course: Erin [name] is a 55 y.o. female with PMH OSA on CPAP, HTN, DMII, morbid obesity who presented with worsening abdominal pain and left thigh pain.   She has been undergoing workup for recently discovered cervical/urethral masses on CT in Sept 2022.  Further imaging showed ongoing interval enlargement of the left adnexal area.  There was also associated retroperitoneal and pelvic adenopathy. MRI pelvis also performed on 12/05/2020 showed poorly enhancing mass centered at the urethra, solid enhancing bilateral ovarian masses, poorly enhancing mass involving left uterine cervix, and significant adenopathy involving bilateral common iliac, bilateral external iliac, bilateral inguinal lymph nodes.  Also noted to have pelvic osseous lesions concerning for metastatic disease as well.  She was admitted for biopsy with urology and GYN oncology.  She was able to undergo cystoscopy with urethral and bladder biopsy on 12/17/2020.  She also underwent cervical biopsies following this procedure.  Interval History:  No events overnight.  States that the addition of fentanyl patch yesterday did finally help her pain some.  Did not wish to increase the patch strength yet but states will let us know. She was also okay with palliative care coming on board to start some further discussions with her today.  She states her sister is also coming into town from [place] today also. Still has not had a bowel movement yet.  Assessment & Plan: * Mass of urethra - s/p MRI pelvis on 12/05/20 -Biopsy taken during cystoscopy on 12/17/2020 - Follow-up biopsy results as able - Continue Foley in place  Elevated CEA - patient being followed by urology and GYN onc currently - CEA 1864 and CA-125 is 29 on admission - trying to await biopsy results for type of malignancy  then will consult GI at that time depending on need  Ovarian mass - s/p MRI pelvis on 12/05/20 showing several masses - now s/p cervical, uretheral, and bladder biopsy on 11/8 - large concern for obvious malignancy; primary to be determined but appears to have significant mets; PET pending outpatient  Abdominal pain - Presumed due to underlying multiple masses with significant lymphadenopathy and pelvic osseous lesions - Currently pain control is her biggest issue as well - Increased Dilaudid and increase oxycodone doses.  May need to convert to extended release OxyContin at some point - added fentanyl patch 11/9, this seems to have finally improved her pain some; she did not need to increase strength today  - she is now amenable to having palliative care come on board to start some discussions while awaiting path results and more plans on next steps   Constipation - patient endorses no BM in 5-6 days; uses laxatives at home to try and stay regular - at higher risk with opioids - continue bowel regimen  Malnutrition of moderate degree - Moderate malnutrition in context of acute illness/injury, morbid obesity - Evaluated by dietitian, appreciate assistance - per RD: "Moderate Malnutrition related to acute illness as evidenced by percent weight loss, moderate muscle depletion, energy intake < 75% for > 7 days."  Hypokalemia - Replete as needed  OSA (obstructive sleep apnea) - Continue nightly CPAP  Diabetes mellitus (Erin [name]) - continue SSI and CBG monitoring   Essential hypertension - Initially elevated on admission, has since improved.  Possibly from some uncontrolled pain initially - Continue monitoring pressure and will  treat further as needed    Old records reviewed in assessment of this patient  Antimicrobials:   DVT prophylaxis: Lovenox  Code Status:   Code Status: Full Code  Disposition Plan: Status is: Inpatient   Objective: Blood pressure (!) 147/85, pulse 90,  temperature 98.1 F (36.7 C), temperature source Oral, resp. rate 18, height 5\' 4"  (1.626 m), weight 127 kg, SpO2 98 %.  Examination:  Physical Exam Constitutional:      General: She is not in acute distress.    Appearance: She is well-developed. She is obese.     Comments: Uncomfortable appearing  HENT:     Head: Normocephalic and atraumatic.     Mouth/Throat:     Mouth: Mucous membranes are moist.  Eyes:     Extraocular Movements: Extraocular movements intact.  Cardiovascular:     Rate and Rhythm: Normal rate and regular rhythm.  Pulmonary:     Effort: Pulmonary effort is normal.     Breath sounds: Normal breath sounds.  Abdominal:     Comments: Obese, slightly distended, hypoactive bowel sounds, nonspecific tenderness but worse in lower quadrants  Musculoskeletal:        General: Normal range of motion.     Cervical back: Normal range of motion and neck supple.  Skin:    General: Skin is warm and dry.  Neurological:     General: No focal deficit present.     Mental Status: She is alert.  Psychiatric:        Mood and Affect: Mood normal.        Behavior: Behavior normal.     Consultants:  Urology GYN oncology  Procedures:  Diagnostic cystoscopy, bladder biopsy, urethral biopsy, 12/17/20 Cervical biopsies, 12/17/2020  Data Reviewed: I have personally reviewed labs and imaging studies     LOS: 3 days  Time spent: Greater than 50% of the 35 minute visit was spent in counseling/coordination of care for the patient as laid out in the A&P.   Dwyane Dee, MD [facility] 12/19/2020, 2:45 PM

## 2020-12-19 NOTE — Progress Notes (Signed)
GYN Oncology Progress Note  Erin [name] is a 55 year old female initially seen by Dr. Jeral Pinch in consultation at the request of Dr. Radene Knee for evaluation of multiple abnormal findings on recent MRI raising the concern for metastatic cancer, possible GYN origin. From the visit, she was scheduled for a PET scan and was scheduled to have a procedure with Urology for cystoscopy and evaluation of a urethral mass. She presented to the ER on 12/16/20 for severe LLQ pain with bilateral lower extremity pain. CT imaging showed rapid increase in size of left ovary with ascites, possible bony metastatic disease to the spine, enlarging masses elsewhere. A pelvic ultrasound was also performed along with a MR of the spine revealing abnormalities of the spine suspicious for diffuse osseous metastatic disease along with retroperitoneal/iliac adenopathy. On 12/17/2020, she underwent an examination under anesthesia, diagnostic cystoscopy, bladder and urethral biopsy.   This am, she reports improvement in her abdominal and leg pain. She still has not had a BM. Passing flatus. She is tolerating a regular diet but states it is difficult to eat laying in bed. No nausea or emesis reported. Denies chest pain, dyspnea. She did not sleep well due to frequent interruptions. No concerns voiced.   Exam: Alert, oriented, laying in bed, in no acute distress, appears more comfortable today. Lungs clear. Heart regular in rate and rhythm. Abdomen obese, active bowel sounds. No lower extremity edema appreciated. Foley in place with sanguinous output.  Continue plan of care per the Hospitalist Team. GYN Onc will continue to follow. Advised patient we are still waiting for the biopsy results. No concerns voiced.

## 2020-12-20 ENCOUNTER — Ambulatory Visit (HOSPITAL_COMMUNITY)
Admission: RE | Admit: 2020-12-20 | Discharge: 2020-12-20 | Disposition: A | Discharge status: Home / Self Care | Payer: Self-pay | Source: Ambulatory Visit | Attending: Gynecologic Oncology | Admitting: Gynecologic Oncology

## 2020-12-20 ENCOUNTER — Encounter (HOSPITAL_COMMUNITY): Payer: Self-pay | Admitting: Family Medicine

## 2020-12-20 DIAGNOSIS — Z7189 Other specified counseling: Secondary | ICD-10-CM

## 2020-12-20 DIAGNOSIS — E440 Moderate protein-calorie malnutrition: Secondary | ICD-10-CM

## 2020-12-20 DIAGNOSIS — C801 Malignant (primary) neoplasm, unspecified: Secondary | ICD-10-CM

## 2020-12-20 DIAGNOSIS — C799 Secondary malignant neoplasm of unspecified site: Secondary | ICD-10-CM

## 2020-12-20 DIAGNOSIS — N368 Other specified disorders of urethra: Secondary | ICD-10-CM

## 2020-12-20 DIAGNOSIS — Z515 Encounter for palliative care: Secondary | ICD-10-CM

## 2020-12-20 LAB — CBC WITH DIFFERENTIAL/PLATELET
Abs Immature Granulocytes: 0.69 10*3/uL — ABNORMAL HIGH (ref 0.00–0.07)
Basophils Absolute: 0 10*3/uL (ref 0.0–0.1)
Basophils Relative: 0 %
Eosinophils Absolute: 0.1 10*3/uL (ref 0.0–0.5)
Eosinophils Relative: 1 %
HCT: 26.3 % — ABNORMAL LOW (ref 36.0–46.0)
Hemoglobin: 8.6 g/dL — ABNORMAL LOW (ref 12.0–15.0)
Immature Granulocytes: 6 %
Lymphocytes Relative: 11 %
Lymphs Abs: 1.3 10*3/uL (ref 0.7–4.0)
MCH: 30.1 pg (ref 26.0–34.0)
MCHC: 32.7 g/dL (ref 30.0–36.0)
MCV: 92 fL (ref 80.0–100.0)
Monocytes Absolute: 0.8 10*3/uL (ref 0.1–1.0)
Monocytes Relative: 7 %
Neutro Abs: 9 10*3/uL — ABNORMAL HIGH (ref 1.7–7.7)
Neutrophils Relative %: 75 %
Platelets: 225 10*3/uL (ref 150–400)
RBC: 2.86 MIL/uL — ABNORMAL LOW (ref 3.87–5.11)
RDW: 13.6 % (ref 11.5–15.5)
WBC: 11.9 10*3/uL — ABNORMAL HIGH (ref 4.0–10.5)
nRBC: 0.2 % (ref 0.0–0.2)

## 2020-12-20 LAB — GLUCOSE, CAPILLARY
Glucose-Capillary: 118 mg/dL — ABNORMAL HIGH (ref 70–99)
Glucose-Capillary: 140 mg/dL — ABNORMAL HIGH (ref 70–99)
Glucose-Capillary: 135 mg/dL — ABNORMAL HIGH (ref 70–99)
Glucose-Capillary: 145 mg/dL — ABNORMAL HIGH (ref 70–99)

## 2020-12-20 LAB — COMPREHENSIVE METABOLIC PANEL
ALT: 15 U/L (ref 0–44)
AST: 29 U/L (ref 15–41)
Albumin: 2 g/dL — ABNORMAL LOW (ref 3.5–5.0)
Alkaline Phosphatase: 706 U/L — ABNORMAL HIGH (ref 38–126)
Anion gap: 9 (ref 5–15)
BUN: 25 mg/dL — ABNORMAL HIGH (ref 6–20)
CO2: 28 mmol/L (ref 22–32)
Calcium: 9.5 mg/dL (ref 8.9–10.3)
Chloride: 95 mmol/L — ABNORMAL LOW (ref 98–111)
Creatinine, Ser: 0.77 mg/dL (ref 0.44–1.00)
GFR, Estimated: >60 mL/min (ref >60)
Glucose, Bld: 138 mg/dL — ABNORMAL HIGH (ref 70–99)
Potassium: 4.4 mmol/L (ref 3.5–5.1)
Sodium: 132 mmol/L — ABNORMAL LOW (ref 135–145)
Total Bilirubin: 0.6 mg/dL (ref 0.3–1.2)
Total Protein: 6.2 g/dL — ABNORMAL LOW (ref 6.5–8.1)

## 2020-12-20 LAB — MAGNESIUM: Magnesium: 2.4 mg/dL (ref 1.7–2.4)

## 2020-12-20 MED ORDER — PEG-KCL-NACL-NASULF-NA ASC-C 100 G PO SOLR
0.5000 | Freq: Once | ORAL | Status: AC
  Administered 2020-12-20: 100 g via ORAL
  Filled 2020-12-20: qty 1

## 2020-12-20 MED ORDER — BISACODYL 5 MG PO TBEC
20.0000 mg | DELAYED_RELEASE_TABLET | Freq: Once | ORAL | Status: AC
  Administered 2020-12-20: 20 mg via ORAL
  Filled 2020-12-20: qty 4

## 2020-12-20 MED ORDER — METOCLOPRAMIDE HCL 5 MG/ML IJ SOLN
10.0000 mg | Freq: Once | INTRAMUSCULAR | Status: AC
  Administered 2020-12-20: 10 mg via INTRAVENOUS
  Filled 2020-12-20: qty 2

## 2020-12-20 MED ORDER — SODIUM CHLORIDE 0.9 % IV SOLN
1.0000 g | INTRAVENOUS | Status: AC
  Administered 2020-12-20 – 2020-12-22 (×3): 1 g via INTRAVENOUS
  Filled 2020-12-20 (×3): qty 10

## 2020-12-20 MED ORDER — PEG-KCL-NACL-NASULF-NA ASC-C 100 G PO SOLR: 1.0000 | Freq: Once | ORAL | Status: DC

## 2020-12-20 MED ORDER — PEG-KCL-NACL-NASULF-NA ASC-C 100 G PO SOLR
0.5000 | Freq: Once | ORAL | Status: AC
  Administered 2020-12-20: 100 g via ORAL

## 2020-12-20 MED ORDER — FENTANYL 50 MCG/HR TD PT72
1.0000 | MEDICATED_PATCH | TRANSDERMAL | Status: DC
  Administered 2020-12-20 – 2020-12-23 (×2): 1 via TRANSDERMAL
  Filled 2020-12-20 (×2): qty 1

## 2020-12-20 MED ORDER — METOCLOPRAMIDE HCL 5 MG/ML IJ SOLN
10.0000 mg | Freq: Once | INTRAMUSCULAR | Status: AC
Start: 2020-12-20 — End: 2020-12-20
  Administered 2020-12-20: 10 mg via INTRAVENOUS
  Filled 2020-12-20: qty 2

## 2020-12-20 NOTE — Progress Notes (Signed)
GYN Oncology Progress Note  Erin [name] is a 55 year old female initially seen by Dr. Jeral Pinch in consultation at the request of Dr. Radene Knee for evaluation of multiple abnormal findings on recent MRI raising the concern for metastatic cancer, possible GYN origin. From the visit, she was scheduled for a PET scan and was scheduled to have a procedure with Urology for cystoscopy and evaluation of a urethral mass. She presented to the ER on 12/16/20 for severe LLQ pain with bilateral lower extremity pain. CT imaging showed rapid increase in size of left ovary with ascites, possible bony metastatic disease to the spine, enlarging masses elsewhere. A pelvic ultrasound was also performed along with a MR of the spine revealing abnormalities of the spine suspicious for diffuse osseous metastatic disease along with retroperitoneal/iliac adenopathy. On 12/17/2020, she underwent an examination under anesthesia, diagnostic cystoscopy, bladder and urethral biopsy.   This am, she reports pain in the LLQ and left leg after straining to have a bowel movement. The discomfort has eased but is still present. She feels the fentanyl patch is helping with pain control. She is tolerating a regular diet but states it is difficult to eat laying in bed and it helps if she can have someone feed her. No nausea or emesis reported. Denies chest pain, dyspnea. Discussed physical therapy referral to increase mobility. No concerns voiced.   Exam: Alert, oriented, laying in bed, in no acute distress. Lungs clear. Heart regular in rate and rhythm. Abdomen obese, active bowel sounds. No lower extremity pitting edema appreciated, mild around bilateral ankles. Foley in place with dark yellow output.  Continue plan of care per the Hospitalist Team. Physical therapy referral placed. GYN Onc will continue to follow. Still awaiting biopsy results from pathology. No concerns voiced.

## 2020-12-20 NOTE — Evaluation (Signed)
Physical Therapy Evaluation Patient Details Name: Erin [name] MRN: [id number] DOB: 04-11-1965 Today's Date: 12/20/2020  History of Present Illness  55yo female who presented on 11/7 with increasing abdominal pain and L thigh pain. Has recent hx of cervical/urethral masses found on CT 9/22, as well as urethral mass, B ovarian masses, L uterine cervical mass, and pelvis osseous lesions concerning for metastatic disease. Admitted for further w/u and biopsy. PMH HLD, HTN, OSA, DOE, DM, shoulder arthroscopy with rotator cuff repair and subacromial decompression  Clinical Impression   Patient received in bed, pleasant and cooperative with therapy today. HR around 120BPM at rest with increase to 132BPM with low level activity this session. Able to get to EOB with 2 person assist and [facility] elevated/increased time, did need extra cues and up to [place] to scoot forward to EOB to get feet on floor today. Able to perform stand pivot transfer to recliner with MinAx2/RW but had quite a bit of pelvic pain and very fatigued. Left up in recliner with all needs met, chair alarm active and RN aware of pt status. Would prefer to return home so we will plan for more aggressive PT POC- but may need to potentially consider other rehab venue options if progress is slow.        Recommendations for follow up therapy are one component of a multi-disciplinary discharge planning process, led by the attending physician.  Recommendations may be updated based on patient status, additional functional criteria and insurance authorization.  Follow Up Recommendations Home health PT (must consider SNF vs CIR if progress is slow)    Assistance Recommended at Discharge Frequent or constant Supervision/Assistance  Functional Status Assessment Patient has had a recent decline in their functional status and demonstrates the ability to make significant improvements in function in a reasonable and predictable amount of time.  Equipment  Recommendations  Rolling walker (2 wheels);Hospital bed;BSC/3in1;Wheelchair (measurements PT);Wheelchair cushion (measurements PT) (bariatric equipment)    Recommendations for Other Services       Precautions / Restrictions Precautions Precautions: Fall;Other (comment) Precaution Comments: watch HR/O2 Restrictions Weight Bearing Restrictions: No      Mobility  Bed Mobility Overal bed mobility: Needs Assistance Bed Mobility: Supine to Sit     Supine to sit: Min assist;Mod assist;HOB elevated     General bed mobility comments: ModA of 1 person in front/MinA of second person in back with HOB elevated, extra time and increased effort    Transfers Overall transfer level: Needs assistance Equipment used: Rolling walker (2 wheels) Transfers: Sit to/from Stand;Bed to chair/wheelchair/BSC Sit to Stand: Min assist;+2 physical assistance Stand pivot transfers: Min assist;+2 physical assistance         General transfer comment: MinAx2 with Mod cues for hand placement/safe RW use to get to standing, then increased time and MinAx2 for safety to pivot into recliner    Ambulation/Gait               General Gait Details: deferred- fatigue and pelvic pain  Stairs            Wheelchair Mobility    Modified Rankin (Stroke Patients Only)       Balance Overall balance assessment: Needs assistance Sitting-balance support: Feet supported;Bilateral upper extremity supported Sitting balance-Leahy Scale: Good Sitting balance - Comments: statically   Standing balance support: Bilateral upper extremity supported;During functional activity Standing balance-Leahy Scale: Fair Standing balance comment: reliant on BUE support  Pertinent Vitals/Pain Pain Assessment: Faces Faces Pain Scale: Hurts even more Pain Location: pelvis area Pain Descriptors / Indicators: Aching;Discomfort Pain Intervention(s): Limited activity within patient's  tolerance;Monitored during session;Repositioned    Home Living Family/patient expects to be discharged to:: Private residence Living Arrangements: Alone Available Help at Discharge: Friend(s);Available PRN/intermittently Type of Home: Apartment Home Access: Stairs to enter Entrance Stairs-Rails: None Entrance Stairs-Number of Steps: 4 no rails   Home Layout: One level Home Equipment: Other (comment) (uses golf club as a cane)      Prior Function Prior Level of Function : Independent/Modified Independent                     Hand Dominance        Extremity/Trunk Assessment   Upper Extremity Assessment Upper Extremity Assessment: Defer to OT evaluation    Lower Extremity Assessment Lower Extremity Assessment: Generalized weakness    Cervical / Trunk Assessment Cervical / Trunk Assessment: Kyphotic  Communication   Communication: No difficulties  Cognition Arousal/Alertness: Awake/alert Behavior During Therapy: Flat affect;WFL for tasks assessed/performed Overall Cognitive Status: Within Functional Limits for tasks assessed                                 General Comments: calm and cooperative, motivated to move around and work with therapy        General Comments General comments (skin integrity, edema, etc.): HR 120 at rest, up to 132BPM with STP    Exercises     Assessment/Plan    PT Assessment Patient needs continued PT services  PT Problem List Decreased strength;Obesity;Decreased activity tolerance;Decreased safety awareness;Decreased balance;Pain;Decreased mobility;Decreased coordination;Cardiopulmonary status limiting activity       PT Treatment Interventions DME instruction;Balance training;Gait training;Neuromuscular re-education;Stair training;Functional mobility training;Cognitive remediation;Patient/family education;Therapeutic activities;Therapeutic exercise;Manual techniques;Wheelchair mobility training    PT Goals (Current  goals can be found in the Care Plan section)  Acute Rehab PT Goals Patient Stated Goal: get stronger, be able to go home PT Goal Formulation: With patient Time For Goal Achievement: 01/03/21 Potential to Achieve Goals: Fair    Frequency Min 4X/week   Barriers to discharge Decreased caregiver support      Co-evaluation               AM-PAC PT "6 Clicks" Mobility  Outcome Measure Help needed turning from your back to your side while in a flat bed without using bedrails?: A Lot Help needed moving from lying on your back to sitting on the side of a flat bed without using bedrails?: Total Help needed moving to and from a bed to a chair (including a wheelchair)?: A Lot Help needed standing up from a chair using your arms (e.g., wheelchair or bedside chair)?: A Lot Help needed to walk in hospital room?: A Lot Help needed climbing 3-5 steps with a railing? : Total 6 Click Score: 10    End of Session Equipment Utilized During Treatment: Gait belt Activity Tolerance: Patient tolerated treatment well;Patient limited by fatigue Patient left: in chair;with call bell/phone within reach;with chair alarm set Nurse Communication: Mobility status PT Visit Diagnosis: Unsteadiness on feet (R26.81);Difficulty in walking, not elsewhere classified (R26.2);Pain;Muscle weakness (generalized) (M62.81) Pain - Right/Left:  (bilateral) Pain - part of body:  (pelvis)    Time: 5883-2549 PT Time Calculation (min) (ACUTE ONLY): 20 min   Charges:   PT Evaluation $PT Eval Moderate Complexity: 1 Mod  Windell Norfolk, DPT, PN2   Supplemental Physical Therapist [place]    Pager [phone] Acute Rehab Office [phone]

## 2020-12-20 NOTE — Progress Notes (Addendum)
3 Days Post-Op Subjective: Denies abdominal pain or flank pain.  Tolerating Foley catheter.  Objective: Vital signs in last 24 hours: Temp:  [98.1 F (36.7 C)-99 F (37.2 C)] 98.1 F (36.7 C) (11/11 0540) Pulse Rate:  [92-108] 108 (11/11 1452) Resp:  [16-18] 16 (11/11 1452) BP: (146-155)/(83-86) 151/83 (11/11 1452) SpO2:  [87 %-100 %] 87 % (11/11 1452)  Intake/Output from previous day: 11/10 0701 - 11/11 0700 In: 680 [P.O.:680] Out: 1500 [Urine:1500] Intake/Output this shift: Total I/O In: 480 [P.O.:480] Out: 900 [Urine:900]  Physical Exam:  General: Alert and oriented CV: RRR Lungs: Clear Abdomen: Soft, ND, NT Ext: NT, No erythema  Lab Results: Recent Labs    12/18/20 0417 12/19/20 0427 12/20/20 0422  HGB 9.4* 9.6* 8.6*  HCT 29.5* 29.0* 26.3*   BMET Recent Labs    12/19/20 0427 12/20/20 0422  NA 132* 132*  K 4.2 4.4  CL 95* 95*  CO2 29 28  GLUCOSE 142* 138*  BUN 25* 25*  CREATININE 0.81 0.77  CALCIUM 9.5 9.5     Studies/Results: No results found.  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 Lumbar MRI 12/16/2020 with diffuse osseous metastatic disease, RP/iliac adenopathy. 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 -Appreciate GI evaluation. Planning on colonoscopy and EGD. -Following    LOS: 4 days   Matt R. Kahmari Koller MD 12/20/2020, [facility]  Pager: [phone]

## 2020-12-20 NOTE — Consult Note (Addendum)
[place]  [phone] [phone]   [place]  Referral MD: Dr. 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 Carrier is a 55 year old female with a past medical history significant for OSA on CPAP, hypertension, type 2 diabetes mellitus, morbid obesity, and recent diagnosis of ovarian and urethral mass.  She presented to the emergency department with lower abdominal pain and lower extremity pain.  The patient had been seeing Dr. Berline Lopes with GYN oncology for bilateral ovarian masses and urethral mass.  She had been given oxycodone by GYN oncology which was not fully effective.  She noted a 13 pound weight loss over several weeks.  She had a CT of the abdomen/pelvis performed which showed a rapid increase in the size of the left ovary with some adjacent ascites, masslike area of the cervix and potentially within a urethral diverticulum not seen on prior MRI, well-circumscribed left adrenal lesion measuring 3.2 x 2.6 cm heterogeneous pattern subtle sclerosis and lucency in the spine concerning for metastatic disease.  She had an MRI of the lumbar spine performed which showed diffusely abnormal appearance of the marrow highly suspicious for diffuse osseous metastatic disease, retroperitoneal/iliac adenopathy with left larger than right adnexal masses.  CT chest was performed which showed no acute intrathoracic pathology, she has dilatation of the main pulmonary trunk suggestive of pulmonary hypertension.  On 12/13/2020 a CA125 was 29.2 and a CEA was elevated at 1864.  The patient was taken to the OR on 12/17/2020 for EUA and cervical biopsies as well as diagnostic cystoscopy, bladder biopsy, and urethral biopsy.  Operative findings included an indurated anterior vaginal wall with firm mass at 12:00.  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  The immunoprofile most consistent with lower GI tract primary.  I met with the patient in her hospital room.  She has several family members at the bedside.  She reports ongoing left-sided abdominal pain that radiates down to her leg.  Prior to admission, she had a poor appetite and lost about 13 pounds.  She is not having any headaches, dizziness, fevers, chills, chest pain, shortness of breath, nausea, vomiting.  She does report constipation.  Denies melena or hematochezia.  No hematuria reported.  Reports history of colonoscopy in 2019 and had several polyps removed which were tubular adenomas.  She was due for repeat colonoscopy in 3 years and was due sometime this year.  The patient lives alone and has no children.  She has several family members locally who can help.  Denies history of alcohol use.  She previously smoked cigarettes for about 15 years but quit in 2001.  Family history significant for a maternal uncle with renal cell carcinoma, maternal aunt with breast cancer, and maternal grandfather with prostate cancer.  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  Past Medical History:  Diagnosis Date   Anxiety    Constipation    Depression    GERD (gastroesophageal reflux disease)    Pt states she no longer needs meds   Hyperlipidemia    Hypertension    Menorrhagia    OSA (obstructive sleep apnea) 11/25/2017   OSA on CPAP    Other fatigue    Shortness of breath on exertion    Type 2 diabetes mellitus ([place])  Vitamin D deficiency    Wears glasses   :   Past Surgical History:  Procedure Laterality Date   BREAST EXCISIONAL BIOPSY     CYSTOSCOPY N/A 12/17/2020   Procedure: CYSTOSCOPY with biopsies;  Surgeon: Robley Fries, MD;  Location: [facility];  Service: Urology;  Laterality: N/A;   DILITATION &  CURRETTAGE/HYSTROSCOPY WITH NOVASURE ABLATION N/A 08/26/2015   Procedure: DILATATION & CURETTAGE/HYSTEROSCOPY WITH NOVASURE ABLATION;  Surgeon: [name], MD;  Location: [place];  Service: Gynecology;  Laterality: N/A;   EXCISIONAL LEFT BREAST BX  11-14-2007   benign   LAPAROSCOPIC TUBAL LIGATION Bilateral 08/26/2015   Procedure: LAPAROSCOPIC TUBAL LIGATION;  Surgeon: [name], MD;  Location: [place];  Service: Gynecology;  Laterality: Bilateral;   SHOULDER ARTHROSCOPY WITH ROTATOR CUFF REPAIR AND SUBACROMIAL DECOMPRESSION Right 04/08/2020   Procedure: SHOULDER ARTHROSCOPY WITH ROTATOR CUFF REPAIR AND SUBACROMIAL DECOMPRESSION;  Surgeon: Tania Ade, MD;  Location: [facility];  Service: Orthopedics;  Laterality: Right;  NEED 90 MINUTES FOR THIS CASE   THYROID LOBECTOMY Right 04-30-2008  :   Current Facility-Administered Medications  Medication Dose Route Frequency Provider Last Rate Last Admin   bisacodyl (DULCOLAX) suppository 10 mg  10 mg Rectal Daily Dwyane Dee, MD   10 mg at 12/20/20 1009   carisoprodol (SOMA) tablet 350 mg  350 mg Oral TID Dwyane Dee, MD   350 mg at 12/20/20 1010   cefTRIAXone (ROCEPHIN) 1 g in sodium chloride 0.9 % 100 mL IVPB  1 g Intravenous Q24H Dwyane Dee, MD 200 mL/hr at 12/20/20 1013 1 g at 12/20/20 1013   Chlorhexidine Gluconate Cloth 2 % PADS 6 each  6 each Topical Daily Tu, Ching T, DO   6 each at 12/20/20 1011   enoxaparin (LOVENOX) injection 60 mg  60 mg Subcutaneous Q24H Tu, Ching T, DO   60 mg at 12/19/20 2142   fentaNYL (DURAGESIC) 25 MCG/HR 1 patch  1 patch Transdermal Cephus Richer, MD   1 patch at 12/18/20 1655   gabapentin (NEURONTIN) capsule 100 mg  100 mg Oral QHS Tu, Ching T, DO   100 mg at 12/19/20 2142   HYDROmorphone (DILAUDID) injection 1.5 mg  1.5 mg Intravenous Q3H PRN Dwyane Dee, MD   1.5 mg at 12/20/20 1006   insulin aspart (novoLOG) injection 0-9 Units  0-9 Units Subcutaneous TID  WC Tu, Ching T, DO   1 Units at 12/19/20 1654   iohexol (OMNIPAQUE) 350 MG/ML injection 60 mL  60 mL Intravenous Once PRN Tu, Ching T, DO       lactulose (CHRONULAC) 10 GM/15ML solution 20 g  20 g Oral BID Dwyane Dee, MD   20 g at 12/20/20 1013   multivitamin with minerals tablet 1 tablet  1 tablet Oral Daily Dwyane Dee, MD   1 tablet at 12/20/20 1010   oxyCODONE (Oxy IR/ROXICODONE) immediate release tablet 10 mg  10 mg Oral Q6H Dwyane Dee, MD   10 mg at 12/20/20 1130   polyethylene glycol (MIRALAX / GLYCOLAX) packet 17 g  17 g Oral BID Dwyane Dee, MD   17 g at 12/20/20 1013   protein supplement (ENSURE MAX) liquid  11 oz Oral BID Dwyane Dee, MD   11 oz at 12/20/20 1013   senna-docusate (Senokot-S) tablet 1 tablet  1 tablet Oral BID Dwyane Dee, MD   1 tablet at 12/20/20 1010   sorbitol 70 % solution 30 mL  30 mL Oral Q1500 Girguis,  Shanon Brow, MD   30 mL at 12/19/20 1422    Allergies  Allergen Reactions   Tramadol Other (See Comments)    Per patient increased HR and sweating  :   Family History  Problem Relation Age of Onset   Stroke Mother    Diabetes Mother    Hypertension Mother    Obesity Mother    Glaucoma Father    Alcoholism Father    Breast cancer Maternal Aunt    Renal cancer Maternal Uncle    Prostate cancer Maternal Grandfather    Heart disease Paternal Grandmother        enlarged heart   Diabetes Half-Sister    Hypertension Half-Sister    Colon cancer Neg Hx    Esophageal cancer Neg Hx    Stomach cancer Neg Hx    Colon polyps Neg Hx    Rectal cancer Neg Hx    Ovarian cancer Neg Hx    Endometrial cancer Neg Hx    Pancreatic cancer Neg Hx   :  Social History   Socioeconomic History   Marital status: Widowed    Spouse name: Not on file   Number of children: Not on file   Years of education: Not on file   Highest education level: Not on file  Occupational History   Occupation: sterile Soil scientist: [employer]  Tobacco  Use   Smoking status: Former    Years: 15.00    Types: Cigarettes    Quit date: 2001    Years since quitting: 21.8   Smokeless tobacco: Never  Vaping Use   Vaping Use: Never used  Substance and Sexual Activity   Alcohol use: No   Drug use: Yes    Types: Marijuana   Sexual activity: Never  Other Topics Concern   Not on file  Social History Narrative   Not on file   Social Determinants of Health   Financial Resource Strain: Not on file  Food Insecurity: Not on file  Transportation Needs: Not on file  Physical Activity: Not on file  Stress: Not on file  Social Connections: Not on file  Intimate Partner Violence: Not on file  :  Review of Systems: A comprehensive 14 point review of systems was negative except as noted in the HPI.  Exam: Patient Vitals for the past 24 hrs:  BP Temp Temp src Pulse Resp SpO2  12/20/20 0540 (!) 146/85 98.1 F (36.7 C) Oral 92 18 100 %  12/19/20 2110 (!) 155/86 99 F (37.2 C) Oral (!) 105 18 94 %  12/19/20 1346 (!) 147/85 98.1 F (36.7 C) Oral 90 18 98 %    General:  well-nourished in no acute distress.   Eyes:  no scleral icterus.   ENT:  There were no oropharyngeal lesions.    Lymphatics:  Negative cervical, supraclavicular or axillary adenopathy.   Respiratory: lungs were clear bilaterally without wheezing or crackles.   Cardiovascular:  Regular rate and rhythm, S1/S2, without murmur, rub or gallop.  There was no pedal edema.   GI: Positive bowel sounds, soft, tenderness over the left lower abdomen.   Skin exam was without echymosis, petichae.   Neuro exam was nonfocal. Patient was alert and oriented.  Attention was good.   Language was appropriate.  Mood was normal without depression.  Speech was not pressured.  Thought content was not tangential.     Lab Results  Component Value Date   WBC 11.9 (H) 12/20/2020  HGB 8.6 (L) 12/20/2020   HCT 26.3 (L) 12/20/2020   PLT 225 12/20/2020   GLUCOSE 138 (H) 12/20/2020   CHOL 228 (H)  03/08/2020   TRIG 70 03/08/2020   HDL 52 03/08/2020   LDLCALC 164 (H) 03/08/2020   ALT 15 12/20/2020   AST 29 12/20/2020   NA 132 (L) 12/20/2020   K 4.4 12/20/2020   CL 95 (L) 12/20/2020   CREATININE 0.77 12/20/2020   BUN 25 (H) 12/20/2020   CO2 28 12/20/2020    DG Chest 2 View  Result Date: 12/16/2020 CLINICAL DATA:  Shortness of breath in a 55 year old female. EXAM: CHEST - 2 VIEW COMPARISON:  July 10, 2009. FINDINGS: Linear opacities in the LEFT chest of progressed slightly since the previous study. No lobar consolidation. Cardiomediastinal contours and hilar structures are stable. No sign of effusion. Mildly increased density in the subcarinal region on lateral projection. No acute skeletal process on limited assessment. IMPRESSION: Signs of scarring or atelectasis in the LEFT chest, slightly progressed since the previous study. Question developing subcarinal adenopathy, based on lateral projection. Developing lower lobe airspace disease is also a differential consideration. In a patient with pelvic masses on recent pelvic MRI would consider follow-up chest CT for further evaluation. Electronically Signed   By: Zetta Bills M.D.   On: 12/16/2020 14:13   CT CHEST W CONTRAST  Result Date: 12/17/2020 CLINICAL DATA:  Cancer of unknown primary.  Staging. EXAM: CT CHEST WITH CONTRAST TECHNIQUE: Multidetector CT imaging of the chest was performed during intravenous contrast administration. CONTRAST:  32m OMNIPAQUE IOHEXOL 350 MG/ML SOLN COMPARISON:  Chest CT dated 06/01/2007. FINDINGS: Cardiovascular: There is no cardiomegaly or pericardial effusion. Three-vessel coronary vascular calcification. Mild atherosclerotic calcification of the thoracic aorta. No aneurysmal dilatation. The origins of the great vessels of the aortic arch appear patent as visualized. There is dilatation of the main pulmonary trunk suggestive of pulmonary hypertension. Evaluation of the pulmonary arteries is limited due to  respiratory motion artifact and suboptimal opacification and timing of the contrast. No large or central pulmonary artery embolus identified. Mediastinum/Nodes: No hilar or mediastinal adenopathy. The esophagus is grossly unremarkable. Prior right hemithyroidectomy. No mediastinal fluid collection. Lungs/Pleura: Bibasilar subpleural atelectasis/scarring. There is no pleural effusion pneumothorax. The central airways are patent. Upper Abdomen: Indeterminate 3 cm left renal nodule, present on the prior CT, likely a benign etiology such as adenoma. Musculoskeletal: Degenerative changes of the spine. No acute osseous pathology. IMPRESSION: 1. No acute intrathoracic pathology. No CT evidence of central pulmonary artery embolus. 2. Dilatation of the main pulmonary trunk suggestive of pulmonary hypertension. 3. Status post prior right hemithyroidectomy. 4. Bilateral linear atelectasis/scarring. 5. Aortic Atherosclerosis (ICD10-I70.0). Electronically Signed   By: AAnner CreteM.D.   On: 12/17/2020 21:09   MR Lumbar Spine W Wo Contrast  Result Date: 12/16/2020 CLINICAL DATA:  Initial evaluation for low back pain, cancer suspected. EXAM: MRI LUMBAR SPINE WITHOUT AND WITH CONTRAST TECHNIQUE: Multiplanar and multiecho pulse sequences of the lumbar spine were obtained without and with intravenous contrast. CONTRAST:  168mGADAVIST GADOBUTROL 1 MMOL/ML IV SOLN COMPARISON:  Prior CT from earlier the same day. FINDINGS: Segmentation: Standard. Lowest well-formed disc space labeled the L5-S1 level. Alignment: 4 mm facet mediated anterolisthesis of L4 on L5. Alignment otherwise normal with preservation of the normal lumbar lordosis. Vertebrae: Diffusely abnormal appearance of the bone marrow is seen throughout the visualized lumbar spine and pelvis. Associated heterogeneous STIR hyperintensity with irregular heterogeneous postcontrast enhancement. Findings are highly  suspicious for diffuse osseous metastatic disease.  Involvement appears to be most pronounced within the T12 vertebral body as well as the visualized pelvis. Exact measurements of a discrete lesion is difficult given the overall infiltrative appearance of this finding. No associated pathologic fracture. No visible extra osseous extension of tumor at this time. Conus medullaris and cauda equina: Conus extends to the L1 level. Conus and cauda equina appear normal. No visible epidural or intracanalicular tumor. Paraspinal and other soft tissues: Mild edema within the subcutaneous fat of the lower back, which could be related to overall volume status. Paraspinous soft tissues demonstrate no other acute finding. Retroperitoneal/iliac adenopathy noted. Left larger than right adnexal masses partially visualized. Findings better characterized on prior CT. Disc levels: L1-2:  Unremarkable. L2-3: Disc desiccation with mild disc bulge. Superimposed small right foraminal to extraforaminal disc protrusion (series 11, image 17). Mild facet hypertrophy. Underlying short pedicles with a degree of mild spinal stenosis. Mild bilateral L2 foraminal narrowing. L3-4: Disc desiccation with mild disc bulge. Superimposed shallow left extraforaminal disc protrusion with annular fissure (series 11, image 24). Mild facet hypertrophy. Underlying short pedicles. Mild spinal stenosis. Foramina remain patent. L4-5: 4 mm anterolisthesis. Disc desiccation with broad-based posterior pseudo disc bulge. Biforaminal annular fissures noted, left larger than right. Moderate bilateral facet arthrosis. Resultant moderate spinal stenosis. Mild to moderate bilateral L4 foraminal narrowing. L5-S1: Normal interspace. Mild right greater than left facet hypertrophy. No stenosis. IMPRESSION: 1. 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 No associated pathologic fracture or extra osseous extension of  tumor at this time. 2. Retroperitoneal/iliac adenopathy with left larger than right adnexal masses, better characterized on prior CT. 3. Underlying multilevel degenerative spondylosis as above, most pronounced at L4-5 where there is resultant moderate spinal stenosis. Electronically Signed   By: Jeannine Boga M.D.   On: 12/16/2020 23:44   MR PELVIS W WO CONTRAST  Result Date: 12/05/2020 CLINICAL DATA:  55 year old female with indeterminate hypoechoic left adnexal mass identified in the setting of left lower quadrant pain. EXAM: MRI PELVIS WITHOUT AND WITH CONTRAST TECHNIQUE: Multiplanar multisequence MR imaging of the pelvis was performed both before and after administration of intravenous contrast. CONTRAST:  51m GADAVIST GADOBUTROL 1 MMOL/ML IV SOLN COMPARISON:  12/01/2020 pelvic sonogram and unenhanced CT abdomen/pelvis. FINDINGS: Urinary Tract: Mild diffuse bladder wall thickening. There is a poorly marginated enhancing solid 3.7 x 2.8 x 3.0 cm mass centered at the urethra (series 32/image 42 and series 30/image 67), extending from the vesicourethral junction throughout the length of the urethra. There appears to be an underlying right periurethral diverticulum (series 5/image 27). At the inferior tip of the urethra, there is a simple 0.9 x 0.9 x 0.9 cm cystic lesion compatible with a Skene's duct cyst. Bowel: Visualized small and large bowel are normal caliber with no bowel wall thickening. Vascular/Lymphatic: Mild bilateral inguinal lymphadenopathy measuring up to 1.4 cm short axis diameter on the right (series 19/image 71) and 1.4 cm on the left (series 19/image 70). Moderate bilateral external iliac lymphadenopathy measuring up to 1.7 cm bilaterally (series 19/image 53 on the right and 56 on the left). Bilateral common iliac lymphadenopathy, largest 1.6 cm on the right (series 19/image 27) and 1.7 cm on the left (series 19/image 14). Reproductive: Uterus: The anteverted uterus measures 8.2 x 3.7  x 5.5 cm. No uterine fibroids. Inner myometrium (junctional zone) measures 12 mm in thickness, which is compatible with  mild diffuse uterine adenomyosis. Endometrium measures 4 mm in bilayer thickness, which is within normal limits. No endometrial cavity fluid or focal endometrial mass. Poorly marginated enhancing solid mass of the left uterine cervix measuring 2.4 x 2.3 x 3.0 cm (series 5/image 21 and series 8/image 22), which appears to disrupt the normal cervical fibrous stroma without frank parametrial invasion. Ovaries and Adnexa: The right ovary measures 4.0 x 2.4 x 4.3 cm and contains multiple solid avidly enhancing masses, largest 2.3 x 2.0 cm (series 19/image 33). The left ovary measures 8.3 x 7.7 x 8.5 cm and is replaced by a predominantly solid avidly enhancing mass. Other: No abnormal free fluid in the pelvis. No focal pelvic fluid collection. Musculoskeletal: Diffuse patchy confluent nodular replacement of the pelvic osseous structures, best seen on axial series 7. IMPRESSION: 1. Poorly marginated enhancing 3.7 x 2.8 x 3.0 cm mass centered at the urethra, extending from the vesicourethral junction throughout the length of the female urethra, with probable underlying right periurethral diverticulum. This mass is concerning for malignancy. 2. Solid avidly enhancing bilateral ovarian masses, largest 2.3 cm on the right and 8.3 cm on the left, worrisome for bilateral ovarian metastases. 3. Poorly marginated enhancing 2.4 x 2.3 x 3.0 cm mass in the left uterine cervix, which appears to disrupt the normal cervical fibrous stroma without frank parametrial invasion, suspicious for malignancy. 4. Mild-to-moderate bilateral common iliac, bilateral external iliac and bilateral inguinal lymphadenopathy, suspicious for metastatic disease. 5. Diffuse patchy confluent nodular replacement of the pelvic osseous structures, suspicious for osseous metastatic disease. 6. Mild diffuse uterine adenomyosis. Electronically  Signed   By: Ilona Sorrel M.D.   On: 12/05/2020 20:31   US Pelvis Complete  Result Date: 12/16/2020 CLINICAL DATA:  Pelvic pain EXAM: TRANSABDOMINAL ULTRASOUND OF PELVIS DOPPLER ULTRASOUND OF OVARIES TECHNIQUE: Transabdominal ultrasound examination of the pelvis was performed including evaluation of the uterus, ovaries, adnexal regions, and pelvic cul-de-sac. Color and duplex Doppler ultrasound was utilized to evaluate blood flow to the ovaries. COMPARISON:  CT 12/16/2020, MRI 12/05/2020, pelvic ultrasound 12/01/2020 FINDINGS: Uterus Measurements: 6.8 x 3.4 x 5 cm = volume: 60.5 mL. No fibroids or other mass visualized. Endometrium Poorly visible, unable to measure. Right ovary Measurements: 4.8 x 2.9 x 5.5 cm = volume: 38.6 mL. Solid right adnexal mass replaces most of right ovary. Left ovary Measurements: 10.5 x 8.7 x 9.2 cm = volume: 440 mL. Enlarged left ovary with heterogenous solid mass that replaces the left ovary. Previously the left ovary measured 8.3 x 7.7 x 8.5 cm on MRI. Pulsed Doppler evaluation demonstrates normal arterial and venous waveforms in both or adnexa, though note that both ovaries are essentially replaced by mass lesions. Other: No significant ascites. Foley catheter within the bladder which appears somewhat thick walled. IMPRESSION: 1. Very limited exam secondary to habitus and only trans abdominal technique 2. Bilateral solid adnexal masses essentially replacing the ovaries which thereby limits assessment of the ovaries. Left adnexal solid mass lesion has significantly increased in size; documented flow in the adnexa relates to flow within the solid adnexal masses. Gynecology follow-up recommended. Electronically Signed   By: Donavan Foil M.D.   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 EXAM: CT ABDOMEN AND PELVIS WITH  CONTRAST TECHNIQUE: Multidetector CT imaging of the abdomen and pelvis was performed using the standard protocol following bolus administration  of intravenous contrast. CONTRAST:  37m OMNIPAQUE IOHEXOL 350 MG/ML SOLN COMPARISON:  Comparison is made with multiple prior studies which were performed recently, most recent comparison is an MRI of the pelvis of December 05, 2020. FINDINGS: Lower chest: Basilar atelectasis. Subcarinal region not well evaluated. No consolidation or effusion at the lung bases. Hepatobiliary: No focal, suspicious hepatic lesion. No pericholecystic stranding. Portal vein is patent. No biliary duct dilation. Pancreas: Pancreas with normal contours. No signs of adjacent inflammation. Spleen: Spleen normal size and contour. Adrenals/Urinary Tract: Well-circumscribed LEFT adrenal lesion, density value of 19 Hounsfield units on previous noncontrast imaging measuring 3.2 x 2.6 cm., relative washout is indeterminate at 30%. Normal RIGHT adrenal. Normal, symmetric enhancement of bilateral kidneys without focal renal lesion. Stomach/Bowel: Small hiatal hernia. No stranding adjacent to the stomach. No sign of small bowel obstruction or acute small bowel process. The appendix is normal. Stool in various parts of the colon without signs of obstruction or adjacent stranding. Vascular/Lymphatic: Retroperitoneal adenopathy (image 50/2) 14 mm lymph node previously 11 mm along the LEFT common iliac chain. LEFT pelvic sidewall lymph node (image 75/2) 14 mm short axis, previously 13 mm. Similar size of RIGHT pelvic sidewall/external iliac lymph nodes also with little change compared to the most recent comparison imaging. Reproductive: Foley catheter in situ. Uterus and cervix not well at assess nor are the masses within the area of the cervix and urethra seen on previous MRI. Increasing size of LEFT ovary with heterogeneous appearance now measuring 10 x 8.8 cm. When assessed on the study of October 20, 2020  the ovary for reference measured 3.7 x 2.5 cm. RIGHT ovary with area of enhancement shows enlargement as well but not as pronounced as on previous imaging with 2.8 cm area of enhancement in the RIGHT ovary as the largest discrete area which is similar to the recent MRI evaluation. As compared to the study of October 20, 2020 there is little change in the appearance of the RIGHT ovary aside from this area though there is an increasingly nodular appearance of the ovary in this location. Mass posterior to the LEFT ovary just above the uterus measuring 4.4 x 2.8 cm has enlarged from 2.5 cm. Other: Small volume ascites. Musculoskeletal: Heterogeneous pattern associated with the spine in terms of density raises the question of metastatic disease without discrete measurable focal lesion in the spine. For example a geographic area of sclerosis on image 101/5) in the anterior L3 vertebral body does raise the question of underlying lesion with numerous additional foci, essentially nearly all levels of the spine with some degree of heterogeneity. Another example of this finding is noted on the RIGHT at the T10 level. No acute bone finding or frankly destructive bone process. IMPRESSION: Rapid increase in size of the LEFT ovary with some adjacent ascites. The rapid enlargement is seen in the setting of generalized worsening of masses associated with the LEFT and RIGHT ovary but perhaps more pronounced than other areas in the pelvis. The possibility of torsion associated with an ovarian mass could be considered based on this finding. Would correlate with any worsening abdominal pain and with sonogram as warranted for further assessment. Enlarging masses elsewhere though not to the extent that is seen with the LEFT ovary. Masslike area of the cervix and potentially within a urethral diverticulum not as well seen as on the recent MRI evaluation. Small volume ascites. Well-circumscribed LEFT adrenal lesion measuring 3.2 x 2.6 cm,  density value of 19 Hounsfield units on previous  noncontrast imaging. Relative washout is indeterminate at 30%. This may represent an adrenal adenoma. Consider dedicated adrenal protocol CT or attention on PET evaluation if performed. Heterogeneous pattern of subtle sclerosis and lucency in the spine on today's study raising the question of bony metastatic disease in the spine. Findings of increased ovarian size on the LEFT out of proportion other areas were called by telephone at the time of interpretation on 12/16/2020 at 5:44 pm to provider Theodis Blaze , who verbally acknowledged these results. Electronically Signed   By: Zetta Bills M.D.   On: 12/16/2020 17:46   [place] Art/Ven Flow Abd Pelv Doppler  Result Date: 12/16/2020 CLINICAL DATA:  Pelvic pain EXAM: TRANSABDOMINAL ULTRASOUND OF PELVIS DOPPLER ULTRASOUND OF OVARIES TECHNIQUE: Transabdominal ultrasound examination of the pelvis was performed including evaluation of the uterus, ovaries, adnexal regions, and pelvic cul-de-sac. Color and duplex Doppler ultrasound was utilized to evaluate blood flow to the ovaries. COMPARISON:  CT 12/16/2020, MRI 12/05/2020, pelvic ultrasound 12/01/2020 FINDINGS: Uterus Measurements: 6.8 x 3.4 x 5 cm = volume: 60.5 mL. No fibroids or other mass visualized. Endometrium Poorly visible, unable to measure. Right ovary Measurements: 4.8 x 2.9 x 5.5 cm = volume: 38.6 mL. Solid right adnexal mass replaces most of right ovary. Left ovary Measurements: 10.5 x 8.7 x 9.2 cm = volume: 440 mL. Enlarged left ovary with heterogenous solid mass that replaces the left ovary. Previously the left ovary measured 8.3 x 7.7 x 8.5 cm on MRI. Pulsed Doppler evaluation demonstrates normal arterial and venous waveforms in both or adnexa, though note that both ovaries are essentially replaced by mass lesions. Other: No significant ascites. Foley catheter within the bladder which appears somewhat thick walled. IMPRESSION: 1. Very limited exam secondary  to habitus and only trans abdominal technique 2. Bilateral solid adnexal masses essentially replacing the ovaries which thereby limits assessment of the ovaries. Left adnexal solid mass lesion has significantly increased in size; documented flow in the adnexa relates to flow within the solid adnexal masses. Gynecology follow-up recommended. Electronically Signed   By: Donavan Foil M.D.   On: 12/16/2020 19:23   CT Renal Stone Study  Result Date: 12/01/2020 CLINICAL DATA:  Flank pain.  Evaluate for kidney stone. EXAM: CT ABDOMEN AND PELVIS WITHOUT CONTRAST TECHNIQUE: Multidetector CT imaging of the abdomen and pelvis was performed following the standard protocol without IV contrast. COMPARISON:  10/20/2020 FINDINGS: Lower chest: Subsegmental atelectasis versus scar is identified within both lung bases. Hepatobiliary: Mild hepatic steatosis. No focal liver abnormality identified. Gallbladder unremarkable. Pancreas: Unremarkable. No pancreatic ductal dilatation or surrounding inflammatory changes. Spleen: Normal in size without focal abnormality. Right adrenal gland appears normal. Left adrenal mass measures 3.3 x 2.5 cm. Unchanged from 06/02/2016 compatible with a benign adrenal adenoma. Adrenals/Urinary Tract: No kidney stones are identified bilaterally. No signs of hydronephrosis or hydroureter. Mild circumferential wall thickening of the urinary bladder is identified. Stomach/Bowel: Stomach appears normal. The appendix is visualized and appears normal. Moderate stool burden is identified within the colon up to the level of the rectum. No bowel wall thickening, inflammation, or distension. Vascular/Lymphatic: Aortic atherosclerosis.  No aneurysm. Signs of retroperitoneal and pelvic adenopathy, including: 1.1 cm left periaortic node, image 52/2. Increased from 1 cm previously. Left external iliac lymph node measures 1.1 cm, image 71/2. previously 7 mm. Right pelvic sidewall lymph node measures 1.1 cm. This is  unchanged from previous exam. Within the right inguinal region there is a 1.3 cm lymph node, image 85/2. Previously 0.9 cm.  Left inguinal region there is a 1.4 cm node, image 84/2. Formally 1 cm. Reproductive: There is increased soft tissue fullness in the region of the cervix which appears unchanged from 10/20/2020. This measures 4.9 x 3.5 cm, image 76/2. Marked interval enlargement of left ovary. The left ovary has a masslike appearance measuring 8.4 x 6.6 cm, image 66/2. Formally this measured 4.5 x 2.7 cm. The right ovary measures 4.0 by 3.6 cm, image 66/2. Formally 4.6 by 2.7 cm. Other: No ascites or focal fluid collections. No signs of peritoneal nodularity. Small periumbilical hernia contains fat only. Supraumbilical fat containing hernia measures 3.3 cm, image 67/6. Infraumbilical hernia also contains fat only measuring 3.4 cm, image 70/6. Fat containing left inguinal hernia noted, small. Musculoskeletal: No acute or suspicious osseous findings. IMPRESSION: 1. No urinary tract calculi identified. 2. Marked interval enlargement of left ovary with masslike appearance. This is new finding compared with 10/20/2020. Given the short interval findings are favored to represent an acute process such as a hemorrhagic cyst. Tubo-ovarian abscess is less favored but not entirely excluded. Malignancy is also a differential consideration but considered less likely given the short interval from 10/20/2020. Initial workup with transabdominal/transvaginal pelvic sonogram is recommended. If the pelvic ultrasound is inconclusive then contrast enhanced pelvic MRI may be considered when the patient is clinically stable, is able to follow instructions, and remain motionless. Ideally this should be performed on a nonemergent basis. 3. Soft tissue fullness in the region of the cervix is again noted and appears similar to previous exam. Etiology is indeterminate. Advise correlation with cervical cancer screening. 4. Retroperitoneal  and pelvic adenopathy identified. In the setting of infection this may reflect reactive adenopathy. Differential considerations include metastatic disease as well as lymphoproliferative disorders. 5. Multiple fat containing ventral abdominal wall and left inguinal hernias. 6. Hepatic steatosis. 7. Stable left adrenal adenoma. 8. Aortic Atherosclerosis (ICD10-I70.0). Electronically Signed   By: Kerby Moors M.D.   On: 12/01/2020 06:43   US PELVIC COMPLETE W TRANSVAGINAL AND TORSION R/O  Result Date: 12/01/2020 CLINICAL DATA:  LLQ Pain EXAM: TRANSABDOMINAL AND TRANSVAGINAL ULTRASOUND OF PELVIS TECHNIQUE: Both transabdominal and transvaginal ultrasound examinations of the pelvis were performed. Transabdominal technique was performed for global imaging of the pelvis including uterus, ovaries, adnexal regions, and pelvic cul-de-sac. It was necessary to proceed with endovaginal exam following the transabdominal exam to visualize the endometrium and uterus. COMPARISON:  December 01, 2020, September eleventh 2022 FINDINGS: Uterus Uterus is only visualized transvaginally and is suboptimally assessed due to patient incomplete voiding and body habitus. Measurements: 8.8 x 3.6 x 5.0 cm = volume: 84 mL. Bulkiness of the cervix. Endometrium Poorly visualized due to distension of the bladder on transvaginal imaging. Right ovary Not visualized transabdominally or transvaginally. Left adnexal mass: Adnexal mass is labeled as uterus on transabdominal imaging. It measures 9.5 x 6.9 x 7.5 cm for a volume of 260 ML. It is predominately hypoechoic. There are internal anechoic spaces consistent with cystic density within the mass. This corresponds to the mass seen on CT. Doppler imaging was not performed of this mass, limiting evaluation for blood flow. Other findings No abnormal free fluid. IMPRESSION: 1. There is a hypoechoic LEFT adnexal mass which measures approximately 9.5 cm with central internal cystic density. Given newly  conspicuous appearance since September 2022, this may reflect a hemorrhagic cyst or sequela of torsed ovary. Recommend GYN consult, and consideration of pelvis MRI w/o and w/ contrast if clinically warranted. Note: This recommendation does not apply  to premenarchal patients or to those with increased risk (genetic, family history, elevated tumor markers or other high-risk factors) of ovarian cancer. Reference: Radiology 2019 Nov; 293(2):359-371. 2. Bulkiness of the cervix, suboptimally assessed with current technique. Recommend correlation with physical exam. Electronically Signed   By: Valentino Saxon M.D.   On: 12/01/2020 10:22     DG Chest 2 View  Result Date: 12/16/2020 CLINICAL DATA:  Shortness of breath in a 55 year old female. EXAM: CHEST - 2 VIEW COMPARISON:  July 10, 2009. FINDINGS: Linear opacities in the LEFT chest of progressed slightly since the previous study. No lobar consolidation. Cardiomediastinal contours and hilar structures are stable. No sign of effusion. Mildly increased density in the subcarinal region on lateral projection. No acute skeletal process on limited assessment. IMPRESSION: Signs of scarring or atelectasis in the LEFT chest, slightly progressed since the previous study. Question developing subcarinal adenopathy, based on lateral projection. Developing lower lobe airspace disease is also a differential consideration. In a patient with pelvic masses on recent pelvic MRI would consider follow-up chest CT for further evaluation. Electronically Signed   By: Zetta Bills M.D.   On: 12/16/2020 14:13   CT CHEST W CONTRAST  Result Date: 12/17/2020 CLINICAL DATA:  Cancer of unknown primary.  Staging. EXAM: CT CHEST WITH CONTRAST TECHNIQUE: Multidetector CT imaging of the chest was performed during intravenous contrast administration. CONTRAST:  20m OMNIPAQUE IOHEXOL 350 MG/ML SOLN COMPARISON:  Chest CT dated 06/01/2007. FINDINGS: Cardiovascular: There is no cardiomegaly or  pericardial effusion. Three-vessel coronary vascular calcification. Mild atherosclerotic calcification of the thoracic aorta. No aneurysmal dilatation. The origins of the great vessels of the aortic arch appear patent as visualized. There is dilatation of the main pulmonary trunk suggestive of pulmonary hypertension. Evaluation of the pulmonary arteries is limited due to respiratory motion artifact and suboptimal opacification and timing of the contrast. No large or central pulmonary artery embolus identified. Mediastinum/Nodes: No hilar or mediastinal adenopathy. The esophagus is grossly unremarkable. Prior right hemithyroidectomy. No mediastinal fluid collection. Lungs/Pleura: Bibasilar subpleural atelectasis/scarring. There is no pleural effusion pneumothorax. The central airways are patent. Upper Abdomen: Indeterminate 3 cm left renal nodule, present on the prior CT, likely a benign etiology such as adenoma. Musculoskeletal: Degenerative changes of the spine. No acute osseous pathology. IMPRESSION: 1. No acute intrathoracic pathology. No CT evidence of central pulmonary artery embolus. 2. Dilatation of the main pulmonary trunk suggestive of pulmonary hypertension. 3. Status post prior right hemithyroidectomy. 4. Bilateral linear atelectasis/scarring. 5. Aortic Atherosclerosis (ICD10-I70.0). Electronically Signed   By: AAnner CreteM.D.   On: 12/17/2020 21:09   MR Lumbar Spine W Wo Contrast  Result Date: 12/16/2020 CLINICAL DATA:  Initial evaluation for low back pain, cancer suspected. EXAM: MRI LUMBAR SPINE WITHOUT AND WITH CONTRAST TECHNIQUE: Multiplanar and multiecho pulse sequences of the lumbar spine were obtained without and with intravenous contrast. CONTRAST:  168mGADAVIST GADOBUTROL 1 MMOL/ML IV SOLN COMPARISON:  Prior CT from earlier the same day. FINDINGS: Segmentation: Standard. Lowest well-formed disc space labeled the L5-S1 level. Alignment: 4 mm facet mediated anterolisthesis of L4 on L5.  Alignment otherwise normal with preservation of the normal lumbar lordosis. Vertebrae: Diffusely abnormal appearance of the bone marrow is seen throughout the visualized lumbar spine and pelvis. Associated heterogeneous STIR hyperintensity with irregular heterogeneous postcontrast enhancement. Findings are highly suspicious for diffuse osseous metastatic disease. Involvement appears to be most pronounced within the T12 vertebral body as well as the visualized pelvis. Exact measurements of a discrete lesion  is difficult given the overall infiltrative appearance of this finding. No associated pathologic fracture. No visible extra osseous extension of tumor at this time. Conus medullaris and cauda equina: Conus extends to the L1 level. Conus and cauda equina appear normal. No visible epidural or intracanalicular tumor. Paraspinal and other soft tissues: Mild edema within the subcutaneous fat of the lower back, which could be related to overall volume status. Paraspinous soft tissues demonstrate no other acute finding. Retroperitoneal/iliac adenopathy noted. Left larger than right adnexal masses partially visualized. Findings better characterized on prior CT. Disc levels: L1-2:  Unremarkable. L2-3: Disc desiccation with mild disc bulge. Superimposed small right foraminal to extraforaminal disc protrusion (series 11, image 17). Mild facet hypertrophy. Underlying short pedicles with a degree of mild spinal stenosis. Mild bilateral L2 foraminal narrowing. L3-4: Disc desiccation with mild disc bulge. Superimposed shallow left extraforaminal disc protrusion with annular fissure (series 11, image 24). Mild facet hypertrophy. Underlying short pedicles. Mild spinal stenosis. Foramina remain patent. L4-5: 4 mm anterolisthesis. Disc desiccation with broad-based posterior pseudo disc bulge. Biforaminal annular fissures noted, left larger than right. Moderate bilateral facet arthrosis. Resultant moderate spinal stenosis. Mild to  moderate bilateral L4 foraminal narrowing. L5-S1: Normal interspace. Mild right greater than left facet hypertrophy. No stenosis. IMPRESSION: 1. 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 No associated pathologic fracture or extra osseous extension of tumor at this time. 2. Retroperitoneal/iliac adenopathy with left larger than right adnexal masses, better characterized on prior CT. 3. Underlying multilevel degenerative spondylosis as above, most pronounced at L4-5 where there is resultant moderate spinal stenosis. Electronically Signed   By: Jeannine Boga M.D.   On: 12/16/2020 23:44   MR PELVIS W WO CONTRAST  Result Date: 12/05/2020 CLINICAL DATA:  55 year old female with indeterminate hypoechoic left adnexal mass identified in the setting of left lower quadrant pain. EXAM: MRI PELVIS WITHOUT AND WITH CONTRAST TECHNIQUE: Multiplanar multisequence MR imaging of the pelvis was performed both before and after administration of intravenous contrast. CONTRAST:  73m GADAVIST GADOBUTROL 1 MMOL/ML IV SOLN COMPARISON:  12/01/2020 pelvic sonogram and unenhanced CT abdomen/pelvis. FINDINGS: Urinary Tract: Mild diffuse bladder wall thickening. There is a poorly marginated enhancing solid 3.7 x 2.8 x 3.0 cm mass centered at the urethra (series 32/image 42 and series 30/image 67), extending from the vesicourethral junction throughout the length of the urethra. There appears to be an underlying right periurethral diverticulum (series 5/image 27). At the inferior tip of the urethra, there is a simple 0.9 x 0.9 x 0.9 cm cystic lesion compatible with a Skene's duct cyst. Bowel: Visualized small and large bowel are normal caliber with no bowel wall thickening. Vascular/Lymphatic: Mild bilateral inguinal lymphadenopathy measuring up to 1.4 cm short axis diameter on the right (series 19/image 71) and 1.4 cm on  the left (series 19/image 70). Moderate bilateral external iliac lymphadenopathy measuring up to 1.7 cm bilaterally (series 19/image 53 on the right and 56 on the left). Bilateral common iliac lymphadenopathy, largest 1.6 cm on the right (series 19/image 27) and 1.7 cm on the left (series 19/image 14). Reproductive: Uterus: The anteverted uterus measures 8.2 x 3.7 x 5.5 cm. No uterine fibroids. Inner myometrium (junctional zone) measures 12 mm in thickness, which is compatible with mild diffuse uterine adenomyosis. Endometrium measures 4 mm in bilayer thickness, which is within normal limits. No endometrial cavity fluid or focal endometrial mass. Poorly marginated enhancing solid mass of  the left uterine cervix measuring 2.4 x 2.3 x 3.0 cm (series 5/image 21 and series 8/image 22), which appears to disrupt the normal cervical fibrous stroma without frank parametrial invasion. Ovaries and Adnexa: The right ovary measures 4.0 x 2.4 x 4.3 cm and contains multiple solid avidly enhancing masses, largest 2.3 x 2.0 cm (series 19/image 33). The left ovary measures 8.3 x 7.7 x 8.5 cm and is replaced by a predominantly solid avidly enhancing mass. Other: No abnormal free fluid in the pelvis. No focal pelvic fluid collection. Musculoskeletal: Diffuse patchy confluent nodular replacement of the pelvic osseous structures, best seen on axial series 7. IMPRESSION: 1. Poorly marginated enhancing 3.7 x 2.8 x 3.0 cm mass centered at the urethra, extending from the vesicourethral junction throughout the length of the female urethra, with probable underlying right periurethral diverticulum. This mass is concerning for malignancy. 2. Solid avidly enhancing bilateral ovarian masses, largest 2.3 cm on the right and 8.3 cm on the left, worrisome for bilateral ovarian metastases. 3. Poorly marginated enhancing 2.4 x 2.3 x 3.0 cm mass in the left uterine cervix, which appears to disrupt the normal cervical fibrous stroma without frank  parametrial invasion, suspicious for malignancy. 4. Mild-to-moderate bilateral common iliac, bilateral external iliac and bilateral inguinal lymphadenopathy, suspicious for metastatic disease. 5. Diffuse patchy confluent nodular replacement of the pelvic osseous structures, suspicious for osseous metastatic disease. 6. Mild diffuse uterine adenomyosis. Electronically Signed   By: Ilona Sorrel M.D.   On: 12/05/2020 20:31   US Pelvis Complete  Result Date: 12/16/2020 CLINICAL DATA:  Pelvic pain EXAM: TRANSABDOMINAL ULTRASOUND OF PELVIS DOPPLER ULTRASOUND OF OVARIES TECHNIQUE: Transabdominal ultrasound examination of the pelvis was performed including evaluation of the uterus, ovaries, adnexal regions, and pelvic cul-de-sac. Color and duplex Doppler ultrasound was utilized to evaluate blood flow to the ovaries. COMPARISON:  CT 12/16/2020, MRI 12/05/2020, pelvic ultrasound 12/01/2020 FINDINGS: Uterus Measurements: 6.8 x 3.4 x 5 cm = volume: 60.5 mL. No fibroids or other mass visualized. Endometrium Poorly visible, unable to measure. Right ovary Measurements: 4.8 x 2.9 x 5.5 cm = volume: 38.6 mL. Solid right adnexal mass replaces most of right ovary. Left ovary Measurements: 10.5 x 8.7 x 9.2 cm = volume: 440 mL. Enlarged left ovary with heterogenous solid mass that replaces the left ovary. Previously the left ovary measured 8.3 x 7.7 x 8.5 cm on MRI. Pulsed Doppler evaluation demonstrates normal arterial and venous waveforms in both or adnexa, though note that both ovaries are essentially replaced by mass lesions. Other: No significant ascites. Foley catheter within the bladder which appears somewhat thick walled. IMPRESSION: 1. Very limited exam secondary to habitus and only trans abdominal technique 2. Bilateral solid adnexal masses essentially replacing the ovaries which thereby limits assessment of the ovaries. Left adnexal solid mass lesion has significantly increased in size; documented flow in the adnexa  relates to flow within the solid adnexal masses. Gynecology follow-up recommended. Electronically Signed   By: Donavan Foil M.D.   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 EXAM: CT ABDOMEN AND PELVIS WITH CONTRAST TECHNIQUE: Multidetector CT imaging of the abdomen and pelvis was performed using the standard protocol following bolus administration of intravenous contrast. CONTRAST:  78m OMNIPAQUE IOHEXOL 350 MG/ML SOLN COMPARISON:  Comparison is made with multiple prior studies which were performed recently, most recent comparison is an MRI  of the pelvis of December 05, 2020. FINDINGS: Lower chest: Basilar atelectasis. Subcarinal region not well evaluated. No consolidation or effusion at the lung bases. Hepatobiliary: No focal, suspicious hepatic lesion. No pericholecystic stranding. Portal vein is patent. No biliary duct dilation. Pancreas: Pancreas with normal contours. No signs of adjacent inflammation. Spleen: Spleen normal size and contour. Adrenals/Urinary Tract: Well-circumscribed LEFT adrenal lesion, density value of 19 Hounsfield units on previous noncontrast imaging measuring 3.2 x 2.6 cm., relative washout is indeterminate at 30%. Normal RIGHT adrenal. Normal, symmetric enhancement of bilateral kidneys without focal renal lesion. Stomach/Bowel: Small hiatal hernia. No stranding adjacent to the stomach. No sign of small bowel obstruction or acute small bowel process. The appendix is normal. Stool in various parts of the colon without signs of obstruction or adjacent stranding. Vascular/Lymphatic: Retroperitoneal adenopathy (image 50/2) 14 mm lymph node previously 11 mm along the LEFT common iliac chain. LEFT pelvic sidewall lymph node (image 75/2) 14 mm short axis, previously 13 mm. Similar size of RIGHT pelvic sidewall/external iliac lymph nodes  also with little change compared to the most recent comparison imaging. Reproductive: Foley catheter in situ. Uterus and cervix not well at assess nor are the masses within the area of the cervix and urethra seen on previous MRI. Increasing size of LEFT ovary with heterogeneous appearance now measuring 10 x 8.8 cm. When assessed on the study of October 20, 2020 the ovary for reference measured 3.7 x 2.5 cm. RIGHT ovary with area of enhancement shows enlargement as well but not as pronounced as on previous imaging with 2.8 cm area of enhancement in the RIGHT ovary as the largest discrete area which is similar to the recent MRI evaluation. As compared to the study of October 20, 2020 there is little change in the appearance of the RIGHT ovary aside from this area though there is an increasingly nodular appearance of the ovary in this location. Mass posterior to the LEFT ovary just above the uterus measuring 4.4 x 2.8 cm has enlarged from 2.5 cm. Other: Small volume ascites. Musculoskeletal: Heterogeneous pattern associated with the spine in terms of density raises the question of metastatic disease without discrete measurable focal lesion in the spine. For example a geographic area of sclerosis on image 101/5) in the anterior L3 vertebral body does raise the question of underlying lesion with numerous additional foci, essentially nearly all levels of the spine with some degree of heterogeneity. Another example of this finding is noted on the RIGHT at the T10 level. No acute bone finding or frankly destructive bone process. IMPRESSION: Rapid increase in size of the LEFT ovary with some adjacent ascites. The rapid enlargement is seen in the setting of generalized worsening of masses associated with the LEFT and RIGHT ovary but perhaps more pronounced than other areas in the pelvis. The possibility of torsion associated with an ovarian mass could be considered based on this finding. Would correlate with any worsening  abdominal pain and with sonogram as warranted for further assessment. Enlarging masses elsewhere though not to the extent that is seen with the LEFT ovary. Masslike area of the cervix and potentially within a urethral diverticulum not as well seen as on the recent MRI evaluation. Small volume ascites. Well-circumscribed LEFT adrenal lesion measuring 3.2 x 2.6 cm, density value of 19 Hounsfield units on previous noncontrast imaging. Relative washout is indeterminate at 30%. This may represent an adrenal adenoma. Consider dedicated adrenal protocol CT or attention on PET evaluation if performed. Heterogeneous pattern of subtle  sclerosis and lucency in the spine on today's study raising the question of bony metastatic disease in the spine. Findings of increased ovarian size on the LEFT out of proportion other areas were called by telephone at the time of interpretation on 12/16/2020 at 5:44 pm to provider Theodis Blaze , who verbally acknowledged these results. Electronically Signed   By: Zetta Bills M.D.   On: 12/16/2020 17:46   [place] Art/Ven Flow Abd Pelv Doppler  Result Date: 12/16/2020 CLINICAL DATA:  Pelvic pain EXAM: TRANSABDOMINAL ULTRASOUND OF PELVIS DOPPLER ULTRASOUND OF OVARIES TECHNIQUE: Transabdominal ultrasound examination of the pelvis was performed including evaluation of the uterus, ovaries, adnexal regions, and pelvic cul-de-sac. Color and duplex Doppler ultrasound was utilized to evaluate blood flow to the ovaries. COMPARISON:  CT 12/16/2020, MRI 12/05/2020, pelvic ultrasound 12/01/2020 FINDINGS: Uterus Measurements: 6.8 x 3.4 x 5 cm = volume: 60.5 mL. No fibroids or other mass visualized. Endometrium Poorly visible, unable to measure. Right ovary Measurements: 4.8 x 2.9 x 5.5 cm = volume: 38.6 mL. Solid right adnexal mass replaces most of right ovary. Left ovary Measurements: 10.5 x 8.7 x 9.2 cm = volume: 440 mL. Enlarged left ovary with heterogenous solid mass that replaces the left ovary.  Previously the left ovary measured 8.3 x 7.7 x 8.5 cm on MRI. Pulsed Doppler evaluation demonstrates normal arterial and venous waveforms in both or adnexa, though note that both ovaries are essentially replaced by mass lesions. Other: No significant ascites. Foley catheter within the bladder which appears somewhat thick walled. IMPRESSION: 1. Very limited exam secondary to habitus and only trans abdominal technique 2. Bilateral solid adnexal masses essentially replacing the ovaries which thereby limits assessment of the ovaries. Left adnexal solid mass lesion has significantly increased in size; documented flow in the adnexa relates to flow within the solid adnexal masses. Gynecology follow-up recommended. Electronically Signed   By: Donavan Foil M.D.   On: 12/16/2020 19:23   CT Renal Stone Study  Result Date: 12/01/2020 CLINICAL DATA:  Flank pain.  Evaluate for kidney stone. EXAM: CT ABDOMEN AND PELVIS WITHOUT CONTRAST TECHNIQUE: Multidetector CT imaging of the abdomen and pelvis was performed following the standard protocol without IV contrast. COMPARISON:  10/20/2020 FINDINGS: Lower chest: Subsegmental atelectasis versus scar is identified within both lung bases. Hepatobiliary: Mild hepatic steatosis. No focal liver abnormality identified. Gallbladder unremarkable. Pancreas: Unremarkable. No pancreatic ductal dilatation or surrounding inflammatory changes. Spleen: Normal in size without focal abnormality. Right adrenal gland appears normal. Left adrenal mass measures 3.3 x 2.5 cm. Unchanged from 06/02/2016 compatible with a benign adrenal adenoma. Adrenals/Urinary Tract: No kidney stones are identified bilaterally. No signs of hydronephrosis or hydroureter. Mild circumferential wall thickening of the urinary bladder is identified. Stomach/Bowel: Stomach appears normal. The appendix is visualized and appears normal. Moderate stool burden is identified within the colon up to the level of the rectum. No bowel  wall thickening, inflammation, or distension. Vascular/Lymphatic: Aortic atherosclerosis.  No aneurysm. Signs of retroperitoneal and pelvic adenopathy, including: 1.1 cm left periaortic node, image 52/2. Increased from 1 cm previously. Left external iliac lymph node measures 1.1 cm, image 71/2. previously 7 mm. Right pelvic sidewall lymph node measures 1.1 cm. This is unchanged from previous exam. Within the right inguinal region there is a 1.3 cm lymph node, image 85/2. Previously 0.9 cm. Left inguinal region there is a 1.4 cm node, image 84/2. Formally 1 cm. Reproductive: There is increased soft tissue fullness in the region of the cervix which appears unchanged  from 10/20/2020. This measures 4.9 x 3.5 cm, image 76/2. Marked interval enlargement of left ovary. The left ovary has a masslike appearance measuring 8.4 x 6.6 cm, image 66/2. Formally this measured 4.5 x 2.7 cm. The right ovary measures 4.0 by 3.6 cm, image 66/2. Formally 4.6 by 2.7 cm. Other: No ascites or focal fluid collections. No signs of peritoneal nodularity. Small periumbilical hernia contains fat only. Supraumbilical fat containing hernia measures 3.3 cm, image 67/6. Infraumbilical hernia also contains fat only measuring 3.4 cm, image 70/6. Fat containing left inguinal hernia noted, small. Musculoskeletal: No acute or suspicious osseous findings. IMPRESSION: 1. No urinary tract calculi identified. 2. Marked interval enlargement of left ovary with masslike appearance. This is new finding compared with 10/20/2020. Given the short interval findings are favored to represent an acute process such as a hemorrhagic cyst. Tubo-ovarian abscess is less favored but not entirely excluded. Malignancy is also a differential consideration but considered less likely given the short interval from 10/20/2020. Initial workup with transabdominal/transvaginal pelvic sonogram is recommended. If the pelvic ultrasound is inconclusive then contrast enhanced pelvic MRI  may be considered when the patient is clinically stable, is able to follow instructions, and remain motionless. Ideally this should be performed on a nonemergent basis. 3. Soft tissue fullness in the region of the cervix is again noted and appears similar to previous exam. Etiology is indeterminate. Advise correlation with cervical cancer screening. 4. Retroperitoneal and pelvic adenopathy identified. In the setting of infection this may reflect reactive adenopathy. Differential considerations include metastatic disease as well as lymphoproliferative disorders. 5. Multiple fat containing ventral abdominal wall and left inguinal hernias. 6. Hepatic steatosis. 7. Stable left adrenal adenoma. 8. Aortic Atherosclerosis (ICD10-I70.0). Electronically Signed   By: Kerby Moors M.D.   On: 12/01/2020 06:43   US PELVIC COMPLETE W TRANSVAGINAL AND TORSION R/O  Result Date: 12/01/2020 CLINICAL DATA:  LLQ Pain EXAM: TRANSABDOMINAL AND TRANSVAGINAL ULTRASOUND OF PELVIS TECHNIQUE: Both transabdominal and transvaginal ultrasound examinations of the pelvis were performed. Transabdominal technique was performed for global imaging of the pelvis including uterus, ovaries, adnexal regions, and pelvic cul-de-sac. It was necessary to proceed with endovaginal exam following the transabdominal exam to visualize the endometrium and uterus. COMPARISON:  December 01, 2020, September eleventh 2022 FINDINGS: Uterus Uterus is only visualized transvaginally and is suboptimally assessed due to patient incomplete voiding and body habitus. Measurements: 8.8 x 3.6 x 5.0 cm = volume: 84 mL. Bulkiness of the cervix. Endometrium Poorly visualized due to distension of the bladder on transvaginal imaging. Right ovary Not visualized transabdominally or transvaginally. Left adnexal mass: Adnexal mass is labeled as uterus on transabdominal imaging. It measures 9.5 x 6.9 x 7.5 cm for a volume of 260 ML. It is predominately hypoechoic. There are internal  anechoic spaces consistent with cystic density within the mass. This corresponds to the mass seen on CT. Doppler imaging was not performed of this mass, limiting evaluation for blood flow. Other findings No abnormal free fluid. IMPRESSION: 1. There is a hypoechoic LEFT adnexal mass which measures approximately 9.5 cm with central internal cystic density. Given newly conspicuous appearance since September 2022, this may reflect a hemorrhagic cyst or sequela of torsed ovary. Recommend GYN consult, and consideration of pelvis MRI w/o and w/ contrast if clinically warranted. Note: This recommendation does not apply to premenarchal patients or to those with increased risk (genetic, family history, elevated tumor markers or other high-risk factors) of ovarian cancer. Reference: Radiology 2019 Nov; 293(2):359-371. 2. Bulkiness of  the cervix, suboptimally assessed with current technique. Recommend correlation with physical exam. Electronically Signed   By: Valentino Saxon M.D.   On: 12/01/2020 10:22    Pathology:  SURGICAL PATHOLOGY  CASE: [phone]  PATIENT: Jerlean Stellmach  Surgical Pathology Report   Clinical History: Metastatic cancer (crm)   FINAL MICROSCOPIC DIAGNOSIS:   A. 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   Given all parts shows the same histomorphology with signet  ring cells.  Immunostains show that the tumor cells are positive for  CK20 and CDX2 while they are negative for CK7, PAX8, GATA3, ER, p63 and  CK5/6.  This immunoprofile is most consistent with lower  gastrointestinal tract primary.  Dr. Melina Copa reviewed the case and  concurs with the diagnosis.   Dr. Berline Lopes was notified on 12/20/2020.   Assessment and Plan:  1.  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  GI primary -12/05/2020 MRI of the pelvis-poorly marginated enhancing 3.7 x 2.8 x 3.0 cm mass centered at the urethra, solid avidly enhancing bilateral ovarian masses, poorly marginated enhancing 2.4 x 2.3 x 3.0 cm mass in the left uterine cervix, mild to moderate bilateral common iliac, bilateral external iliac, and bilateral inguinal lymphadenopathy, diffuse patchy confluent nodular replacement of the pelvic osseous structures. -12/13/2020 CEA 1864, CA125 29.2 -12/16/2020 CT abdomen/pelvis-rapidly increasing size of the left ovary with some adjacent ascites, enlarging masses elsewhere, masslike area of the cervix and potentially within a urethral diverticulum, well-circumscribed left adrenal lesion measuring 3.2 x 2.6 cm, heterogeneous pattern of subtle sclerosis and lucency in the spine. -12/16/2020 MRI of the lumbar spine-diffusely abnormal appearance of the bone marrow throughout the visualized lumbar spine and pelvis highly suspicious for diffuse osseous metastatic disease, retroperitoneal/iliac adenopathy with left larger than right adnexal masses. 12/17/2020 CT chest-no acute intrathoracic pathology 2.  Abdominal pain secondary #1 3.  Constipation 4.  Normocytic anemia 5.  Leukocytosis 6.  Protein calorie malnutrition 7.  Obstructive sleep apnea 8.  Diabetes mellitus 9.  Hypertension 10.  Possible pulmonary hypertension noted on CT chest  Ms. 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  IHC staining most consistent with a lower GI primary.  Discussed the scan results as well as biopsy results with the patient and her family members.  We recommend additional work-up by GI including an upper endoscopy and colonoscopy to further evaluate for primary lesion.  Once we have this  information, we will discuss treatment options in detail with the patient and her family members.  She is having constipation and would recommend a bowel regimen.  Recommend dietitian consultation as well as PT evaluation.  Recommendations: 1.  Recommend GI consult for upper endoscopy and colonoscopy. 2.  Bowel regimen. 3.  PT evaluation. 4.  Narcotic analgesics for pain 5.  Submit the biopsy tissue for molecular testing  Thank you for this referral.   [name], [name], [name], [name]   Ms. Gloeckner was interviewed and examined.  I reviewed CT and MRI images.  She has been diagnosed with poorly differentiated adenocarcinoma with signet ring cell features involving the vagina, bladder neck, and cervix.  The immunohistochemical profile is most consistent with a gastrointestinal primary.  Imaging studies reveal tumor involving the urethra/, cervix, pelvic lymph nodes, ovaries, and bones.  No obvious primary tumor site based on review of the images and lab studies.  This would be an unusual pattern of metastatic spread for colorectal cancer, but this is possible.  She is scheduled undergo upper and lower endoscopy procedures tomorrow.  We will follow-up on the GI evaluation.  We will request the biopsy tissue be sent for molecular testing and mismatch repair protein/MSI testing.  I will see her 02/23/2020.  Please call oncology over the weekend as needed.  I was present for greater than 50% of today's visit.  I performed medical decision making.

## 2020-12-20 NOTE — Consult Note (Addendum)
Referring Provider: [facility] PCP: Pcp, No  Gastroenterologist: Thornton Park, MD Reason for consultation:     abnormal urologic / GYN pathology reports concerning for GI tract origin              ASSESSMENT / PLAN   #  55 yo female presenting with ovarian and urethral masses and concern for metastatic disease. 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  CEA elevated 1,864. No suggestion of GI pathology on imaging studies.        --Dr. Benay Spice was consulted. Will await his input. If he feels that colonoscopy would be helpful then we are happy to proceed. --   # Chronic constipation ( since 2019). Defined as decreased frequency of bowel movements. Takes stool softeners / laxatives as needed.  --Continue currently regimen of Miralax BID.   # Additional medical history listed below.      [facility] Attending   I have taken an interval history, reviewed the chart and examined the patient. I agree with the Advanced Practitioner's note, impression and recommendations.  Majority the medical decision-making in the formulation of the assessment and plan were performed by me.  Case discussed with Dr. Benay Spice.  Given the suggestion that this is a cancer of GI origin we will perform colonoscopy plus or minus EGD tomorrow.The risks and benefits as well as alternatives of endoscopic procedure(s) have been discussed and reviewed. All questions answered. The patient agrees to proceed.   Gatha Mayer, MD, [facility] 12/20/2020 3:41 PM   HISTORY OF PRESENT ILLNESS                                                                                                                         Chief Complaint:  constipation  Erin [name] is a 55 y.o. female with a past medical history significant for HTN, HLD, DM, obesity, OSA. See PMH for any  additional medical problems.   Alexzandra presented to ED in mid September with urinary retention. Imaging showed a tubular area of hypoattenuation in the cervix and urethra. At that time a foley was placed, she was seen at [facility] . Eventually foley was able to be removed and she could urinate. However when laying of left side she felt like she had to urinate.  She had follow up imaging of pelvis showing a mass centered at urethra and bilateral ovarian masses. She came back to ED a few days ago with lower abdominal pain . Repeat imaging concerning for uretheral mass with metastatic disease. She was admitted for further workup. GYN and Urology following On 11/8 she underwent diagnostic cystoscopy, bladder and urethral biopsy. 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 Pathology suggesting lower GI tract malignancy. Dr. Benay Spice ( Oncology) has been consulted   FINAL MICROSCOPIC DIAGNOSIS:   A. VAGINAL WALL, ANTERIOR, BIOPSY:  -  Poorly differentiated adenocarcinoma with signet ring cell features,  see comment   B. 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 CERVIX, BIOPSY:  - Poorly differentiated adenocarcinoma with signet ring cell features,  see comment   A, B and C.   Given all parts shows the same histomorphology with signet  ring cells.  Immunostains show that the tumor cells are positive for  CK20 and CDX2 while they are negative for CK7, PAX8, GATA3, ER, p63 and  CK5/6.  This immunoprofile is most consistent with lower  gastrointestinal tract primary.  Dr. Melina Copa reviewed the case and  concurs with the diagnosis.  Dr. Berline Lopes was notified on 12/20/2020.   Erin [name] had several adenomatous colon polyps in Sept 2019. She is due for 5 year recall colonoscopy. Since her colonoscopy in 2019 Alize says she has had problems with constipation. Prior to the  colonoscopy she had a daily BM but since then has averaged only 1-2 BMs a week. She takes stool softeners and laxatives as needed. She hasn't ever seen any blood in her stool. No known [place] of colon cancer. Her weight has fluctuated over the last few years.   PREVIOUS ENDOSCOPIC EVALUATIONS  / IMAGING STUDIES   Sept 2019 screening colonoscopy  --Complete exam, good prep. The perianal and digital rectal examinations were normal. Three pedunculated polyps were found in the ascending colon. The polyps were 10 to 18 mm in size. These polyps were removed with a hot snare. Resection and retrieval were complete. Estimated blood loss was minimal. - Two semi-pedunculated polyps were found in the transverse colon. The polyps were 3 to 7 mm in size. These polyps were removed with a cold snare. Resection and retrieval were complete. Estimated blood loss was minimal. - The exam was otherwise without abnormality on direct and retroflexion views.  Surgical [P], ascending, polyp (3) - TUBULAR ADENOMA (THIRTY-FIVE FRAGMENTS). - NO HIGH GRADE DYSPLASIA OR MALIGNANCY. Surgical [P], transverse, polyp (2) - TUBULAR ADENOMA (TWO). - NO HIGH GRADE DYSPLASIA OR MALIGNANCY.   MR pelvis 12/05/20 IMPRESSION: 1. Poorly marginated enhancing 3.7 x 2.8 x 3.0 cm mass centered at the urethra, extending from the vesicourethral junction throughout the length of the female urethra, with probable underlying right periurethral diverticulum. This mass is concerning for malignancy. 2. Solid avidly enhancing bilateral ovarian masses, largest 2.3 cm on the right and 8.3 cm on the left, worrisome for bilateral ovarian metastases. 3. Poorly marginated enhancing 2.4 x 2.3 x 3.0 cm mass in the left uterine cervix, which appears to disrupt the normal cervical fibrous stroma without frank parametrial invasion, suspicious for malignancy. 4. Mild-to-moderate bilateral common iliac, bilateral external iliac and bilateral inguinal  lymphadenopathy, suspicious for metastatic disease. 5. Diffuse patchy confluent nodular replacement of the pelvic osseous structures, suspicious for osseous metastatic disease. 6. Mild diffuse uterine adenomyosis.      Past Medical History:  Diagnosis Date  . Anxiety   . Constipation   . Depression   . GERD (gastroesophageal reflux disease)    Pt states she no longer needs meds  . Hyperlipidemia   . Hypertension   . Menorrhagia   . OSA (obstructive sleep apnea) 11/25/2017  . OSA on CPAP   . Other fatigue   . Shortness of breath on exertion   . Type 2 diabetes mellitus ([place])   . Vitamin D deficiency   . Wears glasses     Past Surgical History:  Procedure  Laterality Date  . BREAST EXCISIONAL BIOPSY    . CYSTOSCOPY N/A 12/17/2020   Procedure: CYSTOSCOPY with biopsies;  Surgeon: Robley Fries, MD;  Location: [facility];  Service: Urology;  Laterality: N/A;  . DILITATION & CURRETTAGE/HYSTROSCOPY WITH NOVASURE ABLATION N/A 08/26/2015   Procedure: DILATATION & CURETTAGE/HYSTEROSCOPY WITH NOVASURE ABLATION;  Surgeon: [name], MD;  Location: [place];  Service: Gynecology;  Laterality: N/A;  . EXCISIONAL LEFT BREAST BX  11-14-2007   benign  . LAPAROSCOPIC TUBAL LIGATION Bilateral 08/26/2015   Procedure: LAPAROSCOPIC TUBAL LIGATION;  Surgeon: [name], MD;  Location: [facility];  Service: Gynecology;  Laterality: Bilateral;  . SHOULDER ARTHROSCOPY WITH ROTATOR CUFF REPAIR AND SUBACROMIAL DECOMPRESSION Right 04/08/2020   Procedure: SHOULDER ARTHROSCOPY WITH ROTATOR CUFF REPAIR AND SUBACROMIAL DECOMPRESSION;  Surgeon: Tania Ade, MD;  Location: [facility];  Service: Orthopedics;  Laterality: Right;  NEED 90 MINUTES FOR THIS CASE  . THYROID LOBECTOMY Right 04-30-2008    Prior to Admission medications   Medication Sig Start Date End Date Taking? Authorizing Provider  amLODipine (NORVASC) 5 MG tablet TAKE 1 TABLET (5 MG TOTAL) BY MOUTH  DAILY. Patient taking differently: Take 5 mg by mouth daily. 03/06/20 03/06/21 Yes Fay Records, MD  carvedilol (COREG) 6.25 MG tablet TAKE 1 TABLET BY MOUTH 2 TIMES DAILY WITH A MEAL. Patient taking differently: Take 6.25 mg by mouth 2 (two) times daily with a meal. 03/06/20 03/06/21 Yes Fay Records, MD  hydrochlorothiazide (HYDRODIURIL) 25 MG tablet TAKE 1 TABLET (25 MG TOTAL) BY MOUTH DAILY. Patient taking differently: Take 25 mg by mouth daily. 03/06/20 03/06/21 Yes Fay Records, MD  metFORMIN (GLUCOPHAGE) 500 MG tablet Take 1 tablet (500 mg total) by mouth daily. Patient taking differently: Take 500 mg by mouth daily. 07/10/20  Yes   olmesartan (BENICAR) 40 MG tablet TAKE 1 TABLET (40 MG TOTAL) BY MOUTH DAILY. Patient taking differently: Take 40 mg by mouth daily. 03/06/20 03/06/21 Yes Fay Records, MD  oxyCODONE (OXY IR/ROXICODONE) 5 MG immediate release tablet Take 1 tablet (5 mg total) by mouth every 6 (six) hours as needed for up to 20 doses for severe pain. 12/13/20  Yes Lafonda Mosses, MD  rosuvastatin (CRESTOR) 10 MG tablet TAKE 1 TABLET (10 MG TOTAL) BY MOUTH DAILY. Patient taking differently: Take 10 mg by mouth daily. 03/06/20 03/06/21 Yes Fay Records, MD  buPROPion [facility] SR) 200 MG 12 hr tablet Take 1 tablet (200 mg total) by mouth every morning. Patient not taking: No sig reported 10/10/20   Dennard Nip D, MD  dexamethasone (DECADRON) 1 MG tablet Take 1 tablet (1 mg total) by mouth at bedtime at 11PM prior to 8AM cortisol lab draw Patient not taking: No sig reported 11/18/20     HYDROcodone-acetaminophen (NORCO/VICODIN) 5-325 MG tablet Take 1 tablet by mouth every 4 (four) hours as needed. Patient not taking: No sig reported 12/05/20   Isla Pence, MD  metroNIDAZOLE (FLAGYL) 500 MG tablet Take 1 tablet (500 mg total) by mouth 2 (two) times daily. One po bid x 7 days Patient not taking: No sig reported 12/01/20   Malvin Johns, MD  oxyCODONE-acetaminophen  (PERCOCET/ROXICET) 5-325 MG tablet Take 1 tablet by mouth every 6 (six) hours as needed for severe pain. Patient not taking: No sig reported 12/05/20   Isla Pence, MD  phenazopyridine (PYRIDIUM) 200 MG tablet Take 1 tablet (200 mg total) by mouth 3 (three) times daily as needed for pain.  Patient not taking: No sig reported 11/27/20   Caccavale, Sophia, PA-C  Semaglutide, 1 MG/DOSE, (OZEMPIC, 1 MG/DOSE,) 4 MG/3ML SOPN Inject 1 mg into the skin once a week. Patient not taking: No sig reported 10/10/20   Dennard Nip D, MD  traMADol (ULTRAM) 50 MG tablet Take 1 tablet (50 mg total) by mouth every 6 (six) hours for 5 days Patient not taking: No sig reported 12/11/20     Vitamin D, Ergocalciferol, (DRISDOL) 1.25 MG (50000 UNIT) CAPS capsule Take 1 capsule (50,000 Units total) by mouth every 7 (seven) days. Patient not taking: No sig reported 10/10/20   Starlyn Skeans, MD    Current Facility-Administered Medications  Medication Dose Route Frequency Provider Last Rate Last Admin  . bisacodyl (DULCOLAX) suppository 10 mg  10 mg Rectal Daily Dwyane Dee, MD   10 mg at 12/20/20 1009  . carisoprodol (SOMA) tablet 350 mg  350 mg Oral TID Dwyane Dee, MD   350 mg at 12/20/20 1010  . cefTRIAXone (ROCEPHIN) 1 g in sodium chloride 0.9 % 100 mL IVPB  1 g Intravenous Q24H Dwyane Dee, MD 200 mL/hr at 12/20/20 1013 1 g at 12/20/20 1013  . Chlorhexidine Gluconate Cloth 2 % PADS 6 each  6 each Topical Daily Tu, Ching T, DO   6 each at 12/20/20 1011  . enoxaparin (LOVENOX) injection 60 mg  60 mg Subcutaneous Q24H Tu, Ching T, DO   60 mg at 12/19/20 2142  . fentaNYL (DURAGESIC) 50 MCG/HR 1 patch  1 patch Transdermal Q72H Dwyane Dee, MD      . gabapentin (NEURONTIN) capsule 100 mg  100 mg Oral QHS Tu, Ching T, DO   100 mg at 12/19/20 2142  . HYDROmorphone (DILAUDID) injection 1.5 mg  1.5 mg Intravenous Q3H PRN Dwyane Dee, MD   1.5 mg at 12/20/20 1006  . insulin aspart (novoLOG) injection 0-9 Units   0-9 Units Subcutaneous TID WC Tu, Ching T, DO   1 Units at 12/19/20 1654  . iohexol (OMNIPAQUE) 350 MG/ML injection 60 mL  60 mL Intravenous Once PRN Tu, Ching T, DO      . lactulose (CHRONULAC) 10 GM/15ML solution 20 g  20 g Oral BID Dwyane Dee, MD   20 g at 12/20/20 1013  . multivitamin with minerals tablet 1 tablet  1 tablet Oral Daily Dwyane Dee, MD   1 tablet at 12/20/20 1010  . oxyCODONE (Oxy IR/ROXICODONE) immediate release tablet 10 mg  10 mg Oral Q6H Dwyane Dee, MD   10 mg at 12/20/20 1130  . polyethylene glycol (MIRALAX / GLYCOLAX) packet 17 g  17 g Oral BID Dwyane Dee, MD   17 g at 12/20/20 1013  . protein supplement (ENSURE MAX) liquid  11 oz Oral BID Dwyane Dee, MD   11 oz at 12/20/20 1013  . senna-docusate (Senokot-S) tablet 1 tablet  1 tablet Oral BID Dwyane Dee, MD   1 tablet at 12/20/20 1010  . sorbitol 70 % solution 30 mL  30 mL Oral Q1500 Dwyane Dee, MD   30 mL at 12/19/20 1422    Allergies as of 12/16/2020 - Review Complete 12/16/2020  Allergen Reaction Noted  . Tramadol Other (See Comments) 12/12/2020    Family History  Problem Relation Age of Onset  . Stroke Mother   . Diabetes Mother   . Hypertension Mother   . Obesity Mother   . Glaucoma Father   . Alcoholism Father   . Heart disease Paternal Grandmother  enlarged heart  . Diabetes Half-Sister   . Hypertension Half-Sister   . Colon cancer Neg Hx   . Esophageal cancer Neg Hx   . Stomach cancer Neg Hx   . Colon polyps Neg Hx   . Rectal cancer Neg Hx   . Breast cancer Neg Hx   . Ovarian cancer Neg Hx   . Endometrial cancer Neg Hx   . Pancreatic cancer Neg Hx   . Prostate cancer Neg Hx     Social History   Socioeconomic History  . Marital status: Widowed    Spouse name: Not on file  . Number of children: Not on file  . Years of education: Not on file  . Highest education level: Not on file  Occupational History  . Occupation: Lobbyist:  [employer]  Tobacco Use  . Smoking status: Former    Years: 15.00    Types: Cigarettes    Quit date: 2001    Years since quitting: 21.8  . Smokeless tobacco: Never  Vaping Use  . Vaping Use: Never used  Substance and Sexual Activity  . Alcohol use: No  . Drug use: Yes    Types: Marijuana  . Sexual activity: Never  Other Topics Concern  . Not on file  Social History Narrative  . Not on file   Social Determinants of Health   Financial Resource Strain: Not on file  Food Insecurity: Not on file  Transportation Needs: Not on file  Physical Activity: Not on file  Stress: Not on file  Social Connections: Not on file  Intimate Partner Violence: Not on file    Review of Systems: All systems reviewed and negative except where noted in HPI.   OBJECTIVE    Physical Exam: Vital signs in last 24 hours: Temp:  [98.1 F (36.7 C)-99 F (37.2 C)] 98.1 F (36.7 C) (11/11 0540) Pulse Rate:  [90-105] 92 (11/11 0540) Resp:  [18] 18 (11/11 0540) BP: (146-155)/(85-86) 146/85 (11/11 0540) SpO2:  [94 %-100 %] 100 % (11/11 0540) Last BM Date: 12/19/20  General:  Alert female in NAD Psych:  Pleasant, cooperative. Normal mood and affect Eyes: Pupils equal, no icterus. Conjunctive pink Ears:  Normal auditory acuity Nose: No deformity, discharge or lesions Neck:  Supple, no masses felt Lungs:  Clear to auscultation.  Heart:  Regular rate, regular rhythm. No lower extremity edema Abdomen:  Soft, nondistended, nontender, active bowel sounds, no masses felt Rectal :  Deferred Msk: Symmetrical without gross deformities.  Neurologic:  Alert, oriented, grossly normal neurologically Skin:  Intact without significant lesions.    Scheduled inpatient medications . bisacodyl  10 mg Rectal Daily  . carisoprodol  350 mg Oral TID  . Chlorhexidine Gluconate Cloth  6 each Topical Daily  . enoxaparin (LOVENOX) injection  60 mg Subcutaneous Q24H  . fentaNYL  1 patch Transdermal Q72H  .  gabapentin  100 mg Oral QHS  . insulin aspart  0-9 Units Subcutaneous TID WC  . lactulose  20 g Oral BID  . multivitamin with minerals  1 tablet Oral Daily  . oxyCODONE  10 mg Oral Q6H  . polyethylene glycol  17 g Oral BID  . Ensure Max Protein  11 oz Oral BID  . senna-docusate  1 tablet Oral BID  . sorbitol  30 mL Oral Q1500      Intake/Output from previous day: 11/10 0701 - 11/11 0700 In: 680 [P.O.:680] Out: 1500 [Urine:1500] Intake/Output this shift: Total  I/O In: 360 [P.O.:360] Out: 800 [Urine:800]   Lab Results: Recent Labs    12/18/20 0417 12/19/20 0427 12/20/20 0422  WBC 11.9* 12.6* 11.9*  HGB 9.4* 9.6* 8.6*  HCT 29.5* 29.0* 26.3*  PLT 230 243 225   BMET Recent Labs    12/18/20 0417 12/19/20 0427 12/20/20 0422  NA 132* 132* 132*  K 3.8 4.2 4.4  CL 96* 95* 95*  CO2 27 29 28   GLUCOSE 139* 142* 138*  BUN 21* 25* 25*  CREATININE 0.76 0.81 0.77  CALCIUM 9.3 9.5 9.5   LFTs Recent Labs    12/20/20 0422  PROT 6.2*  ALBUMIN 2.0*  AST 29  ALT 15  ALKPHOS 706*  BILITOT 0.6     . CBC Latest Ref Rng & Units 12/20/2020 12/19/2020 12/18/2020  WBC 4.0 - 10.5 K/uL 11.9(H) 12.6(H) 11.9(H)  Hemoglobin 12.0 - 15.0 g/dL 8.6(L) 9.6(L) 9.4(L)  Hematocrit 36.0 - 46.0 % 26.3(L) 29.0(L) 29.5(L)  Platelets 150 - 400 K/uL 225 243 230    . CMP Latest Ref Rng & Units 12/20/2020 12/19/2020 12/18/2020  Glucose 70 - 99 mg/dL 138(H) 142(H) 139(H)  BUN 6 - 20 mg/dL 25(H) 25(H) 21(H)  Creatinine 0.44 - 1.00 mg/dL 0.77 0.81 0.76  Sodium 135 - 145 mmol/L 132(L) 132(L) 132(L)  Potassium 3.5 - 5.1 mmol/L 4.4 4.2 3.8  Chloride 98 - 111 mmol/L 95(L) 95(L) 96(L)  CO2 22 - 32 mmol/L 28 29 27   Calcium 8.9 - 10.3 mg/dL 9.5 9.5 9.3  Total Protein 6.5 - 8.1 g/dL 6.2(L) 6.6 -  Total Bilirubin 0.3 - 1.2 mg/dL 0.6 0.7 -  Alkaline Phos 38 - 126 U/L 706(H) 799(H) -  AST 15 - 41 U/L 29 35 -  ALT 0 - 44 U/L 15 15 -        Tye Savoy, NP-C @  12/20/2020, 1:10 PM

## 2020-12-20 NOTE — Progress Notes (Signed)
Progress Note    Erin [name]   UYQ:[id number]  DOB: 08/13/1965  DOA: 12/16/2020     4 PCP: Pcp, No  Initial CC: abdominal pain  Hospital Course: Ms. Dry is a 55 y.o. female with PMH OSA on CPAP, HTN, DMII, morbid obesity who presented with worsening abdominal pain and left thigh pain.   She has been undergoing workup for recently discovered cervical/urethral masses on CT in Sept 2022.  Further imaging showed ongoing interval enlargement of the left adnexal area.  There was also associated retroperitoneal and pelvic adenopathy. MRI pelvis also performed on 12/05/2020 showed poorly enhancing mass centered at the urethra, solid enhancing bilateral ovarian masses, poorly enhancing mass involving left uterine cervix, and significant adenopathy involving bilateral common iliac, bilateral external iliac, bilateral inguinal lymph nodes.  Also noted to have pelvic osseous lesions concerning for metastatic disease as well.  She was admitted for biopsy with urology and GYN oncology.  She was able to undergo cystoscopy with urethral and bladder biopsy on 12/17/2020.  She also underwent cervical biopsies following this procedure.  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  Interval History:  No events overnight. Biopsy results formally back today and have been discussed personally with patient as well as with other providers.  She has been aware all week of the suspected diagnosis and today is now processing the information more. Her pain is a little worse today (yesterday it was more controlled) and she's okay with increasing the fentanyl patch further today. She did have a BM yesterday too for the first time after the many laxatives given.   Next steps are now to have discussions about possible treatment options with oncology and/or if more workup still needed in terms of a colonoscopy or possibly an EGD.   Assessment & Plan: *  Mass of urethra - s/p MRI pelvis on 12/05/20 -Biopsy taken during cystoscopy on 12/17/2020 - biopsy has returned on 11/11. 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  Suspicion now confirmed for primary GI malignancy and will engage GI for further evaluation as she may need EGD and colonoscopy at this point.  Medical oncology also consulted given biopsy findings. - palliative care consulted as well for [place] and also assistance with pain control  - follow up GI and oncology eval's for next steps   Elevated CEA - patient being followed by urology and GYN onc initially. Now with biopsy results back, will consult GI and med onc - CEA 1864 and CA-125 is 29 on admission  Ovarian mass - s/p MRI pelvis on 12/05/20 showing several masses - now s/p cervical, uretheral, and bladder biopsy on 11/8  Abdominal pain - Presumed due to underlying multiple masses with significant lymphadenopathy and pelvic osseous lesions - Currently pain control is her biggest issue as well - Increased Dilaudid and increase oxycodone doses.  May need to convert to extended release OxyContin at some point - added fentanyl patch 11/9, this seems to have finally improved her pain some; she is asking for strength increase on 11/11; will increase fentanyl patch to 50 mcg - I have discussed cancer diagnosis with patient and she understands; palliative care also consulted to help with further [place] discussions   Constipation - patient endorses no BM  in 5-6 days; uses laxatives at home to try and stay regular - at higher risk with opioids - continue bowel regimen  Malnutrition of moderate degree - Moderate malnutrition in context of acute illness/injury, morbid obesity -  Evaluated by dietitian, appreciate assistance - per RD: "Moderate Malnutrition related to acute illness as evidenced by percent weight loss, moderate muscle depletion, energy intake < 75% for > 7 days."  Hypokalemia - Replete as needed  OSA (obstructive sleep apnea) - Continue nightly CPAP  Diabetes mellitus ([place]) - continue SSI and CBG monitoring   Essential hypertension - Initially elevated on admission, has since improved.  Possibly from some uncontrolled pain initially - Continue monitoring pressure and will treat further as needed    Old records reviewed in assessment of this patient  Antimicrobials:   DVT prophylaxis: Lovenox  Code Status:   Code Status: Full Code  Disposition Plan: Status is: Inpatient   Objective: Blood pressure (!) 151/83, pulse (!) 108, temperature 98.1 F (36.7 C), temperature source Oral, resp. rate 16, height 5\' 4"  (1.626 m), weight 127 kg, SpO2 (!) 87 %.  Examination:  Physical Exam Constitutional:      General: She is not in acute distress.    Appearance: She is well-developed. She is obese.     Comments: Uncomfortable appearing  HENT:     Head: Normocephalic and atraumatic.     Mouth/Throat:     Mouth: Mucous membranes are moist.  Eyes:     Extraocular Movements: Extraocular movements intact.  Cardiovascular:     Rate and Rhythm: Normal rate and regular rhythm.  Pulmonary:     Effort: Pulmonary effort is normal.     Breath sounds: Normal breath sounds.  Abdominal:     Comments: Obese, slightly distended, hypoactive bowel sounds, nonspecific tenderness but worse in lower quadrants  Musculoskeletal:        General: Normal range of motion.     Cervical back: Normal range of motion and neck supple.  Skin:    General: Skin is warm and dry.  Neurological:     General: No focal deficit present.     Mental Status: She is alert.  Psychiatric:        Mood and Affect: Mood normal.        Behavior: Behavior normal.      Consultants:  Urology GYN oncology  Procedures:  Diagnostic cystoscopy, bladder biopsy, urethral biopsy, 12/17/20 Cervical biopsies, 12/17/2020  Data Reviewed: I have personally reviewed labs and imaging studies     LOS: 4 days  Time spent: Greater than 50% of the 35 minute visit was spent in counseling/coordination of care for the patient as laid out in the A&P.   Dwyane Dee, MD [facility] 12/20/2020, 2:56 PM

## 2020-12-20 NOTE — Assessment & Plan Note (Addendum)
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  Despite suspicion for primary GI malignancy, she had no findings on EGD/CLN for primary source. Next step is further testing and awaiting gastric and polyp biopsies - palliative care consulted as well for [place] and also assistance with pain control  - outpatient PET as able to be arranged; likely has to be done after discharge from [facility] (if she goes to CIR)

## 2020-12-20 NOTE — Progress Notes (Signed)
GYN ONC Progress Note  Patient resting quietly in bed with family at the bedside. Patient is sleepy from recent IV pain administration. Discussed biopsy results with the stains pointing to the lower GI tract. Informed her that Dr. Sabino Gasser had been notified and is reaching out to GI and Med Onc on call Dr. Benay Spice. No questions or concerns voiced.

## 2020-12-20 NOTE — Consult Note (Signed)
Palliative Care Consult Note                                  Date: 12/20/2020   Patient Name: Erin [name]  DOB: 1965/05/24  MRN: [id number]  Age / Sex: 55 y.o., female  PCP: Pcp, No Referring Physician: Dwyane Dee, MD  Reason for Consultation: Establishing goals of care  HPI/Patient Profile: 55 y.o. female  with past medical history of OSA on CPAP, HTN, DMII, morbid obesity who presented with worsening abdominal pain and thigh pain.  Recently undergoing work-up for cervical/urethral masses on CT in September 2022 with interval enlargement of left adnexal area and associated retroperitoneal and pelvic adenopathy.  MRI pelvis with poorly enhancing mass centered at the urethra, bilateral ovarian masses, mass involving the left uterine cervix, and significant adenopathy as well as pelvic osseous lesions concerning for metastatic disease.  She was admitted on 12/16/2020 with acute pain and for biopsy with urology and GYN oncology.  CEA noted to be elevated at 1864 and CEA-125 at 29.  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  Ironically, she had a colonoscopy in 2019 with tubular adenoma polyps removed that were large, but not apparently advanced.  Scans have not showed any evidence of GI malignancy.  Oncology consult ongoing for evaluation and neck steps.  Possible colonoscopy depending on oncology recommendations.  PMT was consulted for goals of care.  Past Medical History:  Diagnosis Date  . Anxiety   . Constipation   . Depression   . GERD (gastroesophageal reflux disease)    Pt states she no longer needs meds  . Hyperlipidemia   . Hypertension   . Menorrhagia   . OSA (obstructive sleep apnea) 11/25/2017  . OSA on CPAP   . Other fatigue   . Shortness of breath on exertion   . Type 2 diabetes mellitus ([place])   . Vitamin D  deficiency   . Wears glasses     Subjective:   This NP Walden Field reviewed medical records, received report from team, assessed the patient and then meet at the patient's bedside to discuss diagnosis, prognosis, GOC, EOL wishes disposition and options.  I met with the patient and her niece Erin [name] at the bedside.   Concept of Palliative Care was introduced as specialized medical care for people and their families living with serious illness.  If focuses on providing relief from the symptoms and stress of a serious illness.  The goal is to improve quality of life for both the patient and the family. Values and goals of care important to patient and family were attempted to be elicited.  Created space and opportunity for patient  and family to explore thoughts and feelings regarding current medical situation   Natural trajectory and current clinical status were discussed. Questions and concerns addressed. Patient  encouraged to call with questions or concerns.    Patient/Family Understanding of Illness: She understands she had a tumor that was growing but not sure why.  They are not sure where it came from.  We discussed the biopsy results showing apparent GI primary, possible colonoscopy upcoming.  Indicated oncology has been consulted for their opinion and next steps.  Life Review: As her husband passed in 2009.  She does not have any children.  She has 1 sister but is more close with  her niece that was visiting her today.  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  However, she fell and had rotator cuff repair and was working with PT trying to get back to work.  She also has a part-time job which she did not get into details.  However, in September when she discovered her pelvic region mass and required her to be out of work for further work-up.  She seems to be a fun-loving individual who loves life.  She  likes to live, travel, have fun.  She does not drink alcohol.  When she goes out it is mostly with work friends.  She likes to cook and eat as well enjoys going to concerts, specifically R&B singer "Joe".  She also enjoys traveling and will often visit family, particularly in [place].  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  We discussed that ongoing evaluation by oncology is pending and this would provide a lot more information on possible neck steps and options for treatment.  She expresses that she wants full scope of treatment.  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  She draws a lot of inspiration from her faith.  She states that she has been in pain recently that was beyond anything she thought she could tolerate.  She wants to recover and get better so that she can share her story with others for inspiration.  She places a lot of faith in Earle.  She feels that it is all up to him and if it is his well then she will be healed.  However she is realistic on what end-of-life looks like as she went through this with her husband, who was septic, on a ventilator, and passed away in Mar 30, 2007.  She expresses that she wants her niece to be her healthcare power of attorney and agreed to a spiritual care consult to complete [facility] POA paperwork.  Review of Systems  Constitutional:  Negative for activity change and fatigue.  Respiratory:  Negative for shortness of breath.   Cardiovascular:  Negative for chest pain.  Gastrointestinal:  Positive for abdominal pain.  Musculoskeletal:  Positive for arthralgias (Leg pain).   Objective:   Primary Diagnoses: Present on Admission: . Abdominal pain . OSA (obstructive sleep apnea) .  Essential hypertension   Physical Exam Vitals and nursing note reviewed.  Constitutional:      General: She is not in acute distress.    Appearance: She is obese. She is not ill-appearing or toxic-appearing.  HENT:     Head: Normocephalic and atraumatic.  Cardiovascular:     Rate and Rhythm: Normal rate.  Pulmonary:     Effort: Pulmonary effort is normal. No respiratory distress.     Breath sounds: Normal breath sounds. No wheezing or rhonchi.  Abdominal:     General: Abdomen is protuberant.     Palpations: Abdomen is soft.  Skin:    General: Skin is warm and dry.  Neurological:     General: No focal deficit present.     Mental Status: She is alert and oriented to person, place, and  time.  Psychiatric:        Mood and Affect: Mood normal.        Behavior: Behavior normal.    Vital Signs:  BP (!) 146/85 (BP Location: Left Arm)   Pulse 92   Temp 98.1 F (36.7 C) (Oral)   Resp 18   Ht 5' 4" (1.626 m)   Wt 127 kg   SpO2 100%   BMI 48.06 kg/m   Palliative Assessment/Data: 90%    Advanced Care Planning:   Primary Decision Maker: PATIENT  Code Status/Advance Care Planning: Full code  A discussion was had today regarding advanced directives. Concepts specific to code status, artifical feeding and hydration, continued IV antibiotics and rehospitalization was had.  The difference between a aggressive medical intervention path and a palliative comfort care path for this patient at this time was had. She elects full scope of care, full code. Wants to seek curative treatment.  Decisions/Changes to ACP: None today  Assessment & Plan:   Impression: 55 year old female with pelvic masses undergoing workup as an outpatient now admitted for acute on chronic abdominal pain and thigh pain. Found enlarging pelvic masses with biopsy consistent with GI primary. Pending decision on whether to repeat colonoscopy. Oncology evaluation pending. Awaiting oncology opinion, treatment  options.  SUMMARY OF RECOMMENDATIONS   Continue full code Full scope of care/treatment Ongoing [place] discussions as care/clinical picture evolves Spiritual care consult for HCPOA completion PMT will continue to follow  Symptom Management:  Per primary team PMT is available to assist as needed  Prognosis:  Unable to determine  Discharge Planning:  To Be Determined   Discussed with: Medical team, nursing team, patient    Thank you for allowing [place] to participate in the care of AREONNA BRAN PMT will continue to support holistically.  Time Total: 105 min  Greater than 50%  of this time was spent counseling and coordinating care related to the above assessment and plan.  Signed by: Walden Field, NP Palliative Medicine Team  Team Phone # [phone] (Nights/Weekends)  12/20/2020, 1:42 PM

## 2020-12-21 DIAGNOSIS — N390 Urinary tract infection, site not specified: Secondary | ICD-10-CM

## 2020-12-21 LAB — GLUCOSE, CAPILLARY
Glucose-Capillary: 145 mg/dL — ABNORMAL HIGH (ref 70–99)
Glucose-Capillary: 140 mg/dL — ABNORMAL HIGH (ref 70–99)
Glucose-Capillary: 137 mg/dL — ABNORMAL HIGH (ref 70–99)
Glucose-Capillary: 130 mg/dL — ABNORMAL HIGH (ref 70–99)
Glucose-Capillary: 115 mg/dL — ABNORMAL HIGH (ref 70–99)

## 2020-12-21 LAB — COMPREHENSIVE METABOLIC PANEL
ALT: 18 U/L (ref 0–44)
AST: 39 U/L (ref 15–41)
Albumin: 2.1 g/dL — ABNORMAL LOW (ref 3.5–5.0)
Alkaline Phosphatase: 684 U/L — ABNORMAL HIGH (ref 38–126)
Anion gap: 8 (ref 5–15)
BUN: 26 mg/dL — ABNORMAL HIGH (ref 6–20)
CO2: 27 mmol/L (ref 22–32)
Calcium: 9.6 mg/dL (ref 8.9–10.3)
Chloride: 101 mmol/L (ref 98–111)
Creatinine, Ser: 0.66 mg/dL (ref 0.44–1.00)
GFR, Estimated: >60 mL/min (ref >60)
Glucose, Bld: 140 mg/dL — ABNORMAL HIGH (ref 70–99)
Potassium: 4.2 mmol/L (ref 3.5–5.1)
Sodium: 136 mmol/L (ref 135–145)
Total Bilirubin: 0.4 mg/dL (ref 0.3–1.2)
Total Protein: 6.1 g/dL — ABNORMAL LOW (ref 6.5–8.1)

## 2020-12-21 LAB — CBC WITH DIFFERENTIAL/PLATELET
Abs Immature Granulocytes: 1.09 10*3/uL — ABNORMAL HIGH (ref 0.00–0.07)
Basophils Absolute: 0.1 10*3/uL (ref 0.0–0.1)
Basophils Relative: 0 %
Eosinophils Absolute: 0.1 10*3/uL (ref 0.0–0.5)
Eosinophils Relative: 1 %
HCT: 27.9 % — ABNORMAL LOW (ref 36.0–46.0)
Hemoglobin: 9.2 g/dL — ABNORMAL LOW (ref 12.0–15.0)
Immature Granulocytes: 8 %
Lymphocytes Relative: 11 %
Lymphs Abs: 1.4 10*3/uL (ref 0.7–4.0)
MCH: 30.2 pg (ref 26.0–34.0)
MCHC: 33 g/dL (ref 30.0–36.0)
MCV: 91.5 fL (ref 80.0–100.0)
Monocytes Absolute: 1 10*3/uL (ref 0.1–1.0)
Monocytes Relative: 8 %
Neutro Abs: 9.4 10*3/uL — ABNORMAL HIGH (ref 1.7–7.7)
Neutrophils Relative %: 72 %
Platelets: 256 10*3/uL (ref 150–400)
RBC: 3.05 MIL/uL — ABNORMAL LOW (ref 3.87–5.11)
RDW: 13.9 % (ref 11.5–15.5)
WBC: 13 10*3/uL — ABNORMAL HIGH (ref 4.0–10.5)
nRBC: 0.5 % — ABNORMAL HIGH (ref 0.0–0.2)

## 2020-12-21 LAB — MAGNESIUM: Magnesium: 2.4 mg/dL (ref 1.7–2.4)

## 2020-12-21 MED ORDER — PEG-KCL-NACL-NASULF-NA ASC-C 100 G PO SOLR: 0.5000 | Freq: Once | ORAL | Status: DC

## 2020-12-21 MED ORDER — PEG-KCL-NACL-NASULF-NA ASC-C 100 G PO SOLR
0.5000 | Freq: Once | ORAL | Status: AC
  Administered 2020-12-21: 100 g via ORAL
  Filled 2020-12-21: qty 1

## 2020-12-21 MED ORDER — AMLODIPINE BESYLATE 5 MG PO TABS
5.0000 mg | ORAL_TABLET | Freq: Every day | ORAL | Status: DC
  Administered 2020-12-21 – 2021-01-14 (×25): 5 mg via ORAL
  Filled 2020-12-21 (×25): qty 1

## 2020-12-21 MED ORDER — BISACODYL 5 MG PO TBEC
10.0000 mg | DELAYED_RELEASE_TABLET | Freq: Once | ORAL | Status: AC
  Administered 2020-12-21: 10 mg via ORAL
  Filled 2020-12-21: qty 2

## 2020-12-21 MED ORDER — PEG-KCL-NACL-NASULF-NA ASC-C 100 G PO SOLR: 1.0000 | Freq: Once | ORAL | Status: DC

## 2020-12-21 MED ORDER — FLEET ENEMA 7-19 GM/118ML RE ENEM
1.0000 | ENEMA | Freq: Once | RECTAL | Status: AC
  Administered 2020-12-21: 1 via RECTAL
  Filled 2020-12-21: qty 1

## 2020-12-21 MED ORDER — CARVEDILOL 6.25 MG PO TABS
6.2500 mg | ORAL_TABLET | Freq: Two times a day (BID) | ORAL | Status: DC
  Administered 2020-12-21 – 2021-01-14 (×49): 6.25 mg via ORAL
  Filled 2020-12-21 (×50): qty 1

## 2020-12-21 NOTE — Progress Notes (Signed)
Chaplain received a consult to see Erin [name] regarding [place].  She wishes to assign her niece as her proxy because her sister is going through her own difficulties right now.  She was not up to filling it out at this time, so I left the paperwork with her and provided some initial support.  I also let her know that if something unexpected were to happen before we get the papers filled out and notarized that her sister would be her next of kin. She is okay with this for the interim.   [place], Bcc Pager, [phone] 1:50 PM

## 2020-12-21 NOTE — Progress Notes (Signed)
Daily Progress Note   Patient Name: Erin [name]       Date: 12/21/2020 DOB: Nov 21, 1965  Age: 55 y.o. MRN#: [id number] Attending Physician: Erin Dee, MD Primary Care Physician: Pcp, No Admit Date: 12/16/2020 Length of Stay: 5 days  Reason for Consultation/Follow-up: Establishing goals of care  HPI/Patient Profile:  55 y.o. female  with past medical history of OSA on CPAP, HTN, DMII, morbid obesity who presented with worsening abdominal pain and thigh pain.  Recently undergoing work-up for cervical/urethral masses on CT in September 2022 with interval enlargement of left adnexal area and associated retroperitoneal and pelvic adenopathy.  MRI pelvis with poorly enhancing mass centered at the urethra, bilateral ovarian masses, mass involving the left uterine cervix, and significant adenopathy as well as pelvic osseous lesions concerning for metastatic disease.  She was admitted on 12/16/2020 with acute pain and for biopsy with urology and GYN oncology.   CEA noted to be elevated at 1864 and CEA-125 at 29.  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  Ironically, she had a colonoscopy in 2019 with tubular adenoma polyps removed that were large, but not apparently advanced.  Scans have not showed any evidence of GI malignancy.  Oncology consult ongoing for evaluation and neck steps.  Possible colonoscopy depending on oncology recommendations.   PMT was consulted for goals of care.  Subjective:   Subjective: Chart Reviewed. Updates received. Patient Assessed. Created space and opportunity for patient  and family to explore thoughts and feelings regarding current medical situation.  Today's Discussion: Today we had an extensive discussion about her current clinical status.  She was initially supposed to have a colonoscopy today but her bowels were not prepped well enough with the single  colon prep.  She is doing another colon prep today and plan for colonoscopy and EGD tomorrow.  GI and oncology have both seen her today.  Once colonoscopy and endoscopy are completed then oncology can start making recommendations for treatment options.  She seems to be in so-so spirits.  She is committed to fighting her disease.  Her overarching goal remains care of her cancer and to be returned to her normal life.  Provided emotional general support through therapeutic listening, empathy, and other techniques.  I answered all questions and addressed all concerns.  Review of Systems  Respiratory:  Negative for shortness of breath.   Cardiovascular:  Negative for chest pain.  Gastrointestinal:  Positive for abdominal pain.  Musculoskeletal:        Noted leg pain   Objective:   Vital Signs:  BP (!) 163/90 (BP Location: Left Arm) Comment: scheduled BP medications given  Pulse (!) 115   Temp 98.5 F (36.9 C) (Oral)   Resp 18   Ht 5\' 4"  (1.626 m)   Wt 127 kg   SpO2 94%   BMI 48.06 kg/m   Physical Exam: Physical Exam Vitals and nursing note reviewed.  Constitutional:      General: She is not in acute distress.    Appearance: She is obese. She is ill-appearing. She is not toxic-appearing.  HENT:     Head: Normocephalic and atraumatic.  Cardiovascular:     Rate and Rhythm: Normal rate.     Heart sounds: No murmur heard. Pulmonary:     Effort: Pulmonary effort is normal. No respiratory distress.     Breath sounds: Normal breath sounds. No wheezing or rhonchi.  Abdominal:  General: Abdomen is flat.     Palpations: Abdomen is soft.     Tenderness: There is abdominal tenderness (Tenderness with light to moderate palpation).  Skin:    General: Skin is warm and dry.  Neurological:     General: No focal deficit present.     Mental Status: She is alert.  Psychiatric:        Mood and Affect: Mood normal.        Behavior: Behavior normal.    Palliative Assessment/Data:  60%   Assessment & Plan:   Impression: Present on Admission: . Abdominal pain . OSA (obstructive sleep apnea) . Essential hypertension  55 year old female with pelvic masses undergoing workup as an outpatient now admitted for acute on chronic abdominal pain and thigh pain. Found enlarging pelvic masses with biopsy consistent with GI primary. Pending decision on whether to repeat colonoscopy. Oncology evaluation pending. Awaiting oncology opinion, treatment options.  SUMMARY OF RECOMMENDATIONS   Continue full code Full scope of care/treatment Ongoing [place] discussions as care/clinical picture evolves Continued spiritual care for HCPOA (see chaplain note) PMT will continue to follow  Code Status: Full code  Prognosis: Unable to determine  Discharge Planning: To Be Determined  Discussed with: Medical team, nursing team, patient  Thank you for allowing [place] to participate in the care of Erin [name] PMT will continue to support holistically.  Time Total: 35 min  Visit consisted of counseling and education dealing with the complex and emotionally intense issues of symptom management and palliative care in the setting of serious and potentially life-threatening illness. Greater than 50%  of this time was spent counseling and coordinating care related to the above assessment and plan.  Erin Field, NP Palliative Medicine Team  Team Phone # [phone] (Nights/Weekends)  10/08/2020, 8:17 AM

## 2020-12-21 NOTE — Progress Notes (Signed)
[facility] ROUNDING NOTE   Subjective: Drink entire bowel prep overnight, but still with brown stools with formed solid material.  Otherwise no acute issues overnight.  Was planning on EGD/colonoscopy today, but will plan instead for additional bowel preparation and procedures to be done tomorrow.   Objective: Vital signs in last 24 hours: Temp:  [98.4 F (36.9 C)-99.3 F (37.4 C)] 98.5 F (36.9 C) (11/12 0948) Pulse Rate:  [108-115] 115 (11/12 0948) Resp:  [16-18] 18 (11/12 0948) BP: (151-163)/(83-90) 163/90 (11/12 0948) SpO2:  [87 %-94 %] 94 % (11/12 0948) Last BM Date: 12/20/20 General: NAD Lungs:  CTA b/l, no w/r/r Heart:  RRR, no m/r/g Abdomen: Generalized TTP without rebound or guarding.  Soft, ND, +BS   Intake/Output from previous day: 11/11 0701 - 11/12 0700 In: 1600 [P.O.:1500; IV Piggyback:100] Out: 2150 [Urine:2150] Intake/Output this shift: No intake/output data recorded.   Lab Results: Recent Labs    12/19/20 0427 12/20/20 0422 12/21/20 0522  WBC 12.6* 11.9* 13.0*  HGB 9.6* 8.6* 9.2*  PLT 243 225 256  MCV 92.4 92.0 91.5   BMET Recent Labs    12/19/20 0427 12/20/20 0422 12/21/20 0522  NA 132* 132* 136  K 4.2 4.4 4.2  CL 95* 95* 101  CO2 29 28 27   GLUCOSE 142* 138* 140*  BUN 25* 25* 26*  CREATININE 0.81 0.77 0.66  CALCIUM 9.5 9.5 9.6   LFT Recent Labs    12/19/20 0427 12/20/20 0422 12/21/20 0522  PROT 6.6 6.2* 6.1*  ALBUMIN 2.1* 2.0* 2.1*  AST 35 29 39  ALT 15 15 18   ALKPHOS 799* 706* 684*  BILITOT 0.7 0.6 0.4   PT/INR No results for input(s): INR in the last 72 hours.    Imaging/Other results: No results found.    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  Imaging also  with e/o metastatic disease to include ascites, left adrenal, L-spine, retroperitoneal/iliac adenopathy - Elevated CEA level at 1864 - Plan for colonoscopy tomorrow, and if negative will do EGD - Clear liquids okay today with n.p.o. at midnight - Additional bowel preparation this afternoon with MoviPrep, Dulcolax, fleets enema  2) Chronic constipation - Will resume MiraLAX bid tomorrow as she will be bowel prep and throughout the day today  The indications, risks, and benefits of EGD and colonoscopy were explained to the patient in detail. Risks include but are not limited to bleeding, perforation, adverse reaction to medications, and cardiopulmonary compromise. Sequelae include but are not limited to the possibility of surgery, hospitalization, and mortality. The patient verbalized understanding and wished to proceed.    Lavena Bullion, DO  12/21/2020, 10:05 AM [facility] Pager [phone]

## 2020-12-21 NOTE — Progress Notes (Signed)
Progress Note    Erin [name]   NLZ:[id number]  DOB: 08/30/1965  DOA: 12/16/2020     5 PCP: Pcp, No  Initial CC: abdominal pain  Hospital Course: Ms. Pesch is a 55 y.o. female with PMH OSA on CPAP, HTN, DMII, morbid obesity who presented with worsening abdominal pain and left thigh pain.   She has been undergoing workup for recently discovered cervical/urethral masses on CT in Sept 2022.  Further imaging showed ongoing interval enlargement of the left adnexal area.  There was also associated retroperitoneal and pelvic adenopathy. MRI pelvis also performed on 12/05/2020 showed poorly enhancing mass centered at the urethra, solid enhancing bilateral ovarian masses, poorly enhancing mass involving left uterine cervix, and significant adenopathy involving bilateral common iliac, bilateral external iliac, bilateral inguinal lymph nodes.  Also noted to have pelvic osseous lesions concerning for metastatic disease as well.  She was admitted for biopsy with urology and GYN oncology.  She was able to undergo cystoscopy with urethral and bladder biopsy on 12/17/2020.  She also underwent cervical biopsies following this procedure.  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  Interval History:  No events overnight.  Patient drowsy and lethargic this morning but had just received some pain medication.  She does seem a little more comfortable in general.  Hopefully is not oversedated with pain regimen adjustments but will trend how she does throughout the day. Still having brown stools so continuing on further bowel prep today and undergoing attempt for colonoscopy tomorrow instead.  Assessment & Plan: * Mass of urethra - s/p MRI pelvis on 12/05/20 -Biopsy taken during cystoscopy on 12/17/2020 - biopsy has returned on 11/11. 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  Suspicion now confirmed for primary GI malignancy and will engage GI for further evaluation as she may need EGD and colonoscopy at this point.  Medical oncology also consulted given biopsy findings. - palliative care consulted as well for [place] and also assistance with pain control  - plan is for colonoscopy +/- EGD with GI; prep poor so postponed until tomorrow   Elevated CEA - patient being followed by urology and GYN onc initially. Now with biopsy results back, will consult GI and med onc - CEA 1864 and CA-125 is 29 on admission  Ovarian mass - s/p MRI pelvis on 12/05/20 showing several masses - now s/p cervical, uretheral, and bladder biopsy on 11/8  Abdominal pain - Presumed due to underlying multiple masses with significant lymphadenopathy and pelvic osseous lesions - Currently pain control is her biggest issue as well - Increased Dilaudid and increase oxycodone doses.  May need to convert to extended release OxyContin at some point - added fentanyl patch 11/9, this seems to have finally improved her pain some; she is asking for strength increase on 11/11; will increase fentanyl patch to 50 mcg - I have discussed cancer diagnosis with patient and she understands; palliative care also consulted to help with further [place] discussions   Acute lower UTI - urine culture growing Strep viridans - blood cultures remain negative - continue rocephin  Constipation - patient endorses no BM in 5-6 days; uses laxatives at home to try and stay regular - at higher  risk with opioids - continue bowel regimen  Malnutrition of moderate degree - Moderate malnutrition in context of acute illness/injury, morbid obesity - Evaluated by dietitian, appreciate  assistance - per RD: "Moderate Malnutrition related to acute illness as evidenced by percent weight loss, moderate muscle depletion, energy intake < 75% for > 7 days."  Hypokalemia - Replete as needed  OSA (obstructive sleep apnea) - Continue nightly CPAP  Diabetes mellitus ([place]) - continue SSI and CBG monitoring   Essential hypertension - Initially elevated on admission, has since improved.  Possibly from some uncontrolled pain initially - Continue monitoring pressure and will treat further as needed   Old records reviewed in assessment of this patient  Antimicrobials:   DVT prophylaxis: Lovenox  Code Status:   Code Status: Full Code  Disposition Plan: Status is: Inpatient   Objective: Blood pressure (!) 163/90, pulse (!) 115, temperature 98.5 F (36.9 C), temperature source Oral, resp. rate 18, height 5\' 4"  (1.626 m), weight 127 kg, SpO2 94 %.  Examination:  Physical Exam Constitutional:      General: She is not in acute distress.    Appearance: She is well-developed. She is obese.  HENT:     Head: Normocephalic and atraumatic.     Mouth/Throat:     Mouth: Mucous membranes are moist.  Eyes:     Extraocular Movements: Extraocular movements intact.  Cardiovascular:     Rate and Rhythm: Normal rate and regular rhythm.  Pulmonary:     Effort: Pulmonary effort is normal.     Breath sounds: Normal breath sounds.  Abdominal:     Comments: Obese, slightly distended, hypoactive bowel sounds, nonspecific tenderness but worse in lower quadrants  Musculoskeletal:        General: Normal range of motion.     Cervical back: Normal range of motion and neck supple.  Skin:    General: Skin is warm and dry.  Neurological:     General: No focal deficit present.     Mental Status: She is alert.  Psychiatric:        Mood and Affect: Mood normal.        Behavior: Behavior normal.     Consultants:  Urology GYN oncology GI Medical oncology Palliative care  Procedures:   Diagnostic cystoscopy, bladder biopsy, urethral biopsy, 12/17/20 Cervical biopsies, 12/17/2020  Data Reviewed: I have personally reviewed labs and imaging studies     LOS: 5 days  Time spent: Greater than 50% of the 35 minute visit was spent in counseling/coordination of care for the patient as laid out in the A&P.   Dwyane Dee, MD [facility] 12/21/2020, 12:16 PM

## 2020-12-21 NOTE — H&P (View-Only) (Signed)
[facility] ROUNDING NOTE   Subjective: Drink entire bowel prep overnight, but still with brown stools with formed solid material.  Otherwise no acute issues overnight.  Was planning on EGD/colonoscopy today, but will plan instead for additional bowel preparation and procedures to be done tomorrow.   Objective: Vital signs in last 24 hours: Temp:  [98.4 F (36.9 C)-99.3 F (37.4 C)] 98.5 F (36.9 C) (11/12 0948) Pulse Rate:  [108-115] 115 (11/12 0948) Resp:  [16-18] 18 (11/12 0948) BP: (151-163)/(83-90) 163/90 (11/12 0948) SpO2:  [87 %-94 %] 94 % (11/12 0948) Last BM Date: 12/20/20 General: NAD Lungs:  CTA b/l, no w/r/r Heart:  RRR, no m/r/g Abdomen: Generalized TTP without rebound or guarding.  Soft, ND, +BS   Intake/Output from previous day: 11/11 0701 - 11/12 0700 In: 1600 [P.O.:1500; IV Piggyback:100] Out: 2150 [Urine:2150] Intake/Output this shift: No intake/output data recorded.   Lab Results: Recent Labs    12/19/20 0427 12/20/20 0422 12/21/20 0522  WBC 12.6* 11.9* 13.0*  HGB 9.6* 8.6* 9.2*  PLT 243 225 256  MCV 92.4 92.0 91.5   BMET Recent Labs    12/19/20 0427 12/20/20 0422 12/21/20 0522  NA 132* 132* 136  K 4.2 4.4 4.2  CL 95* 95* 101  CO2 29 28 27   GLUCOSE 142* 138* 140*  BUN 25* 25* 26*  CREATININE 0.81 0.77 0.66  CALCIUM 9.5 9.5 9.6   LFT Recent Labs    12/19/20 0427 12/20/20 0422 12/21/20 0522  PROT 6.6 6.2* 6.1*  ALBUMIN 2.1* 2.0* 2.1*  AST 35 29 39  ALT 15 15 18   ALKPHOS 799* 706* 684*  BILITOT 0.7 0.6 0.4   PT/INR No results for input(s): INR in the last 72 hours.    Imaging/Other results: No results found.    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  Imaging also  with e/o metastatic disease to include ascites, left adrenal, L-spine, retroperitoneal/iliac adenopathy - Elevated CEA level at 1864 - Plan for colonoscopy tomorrow, and if negative will do EGD - Clear liquids okay today with n.p.o. at midnight - Additional bowel preparation this afternoon with MoviPrep, Dulcolax, fleets enema  2) Chronic constipation - Will resume MiraLAX bid tomorrow as she will be bowel prep and throughout the day today  The indications, risks, and benefits of EGD and colonoscopy were explained to the patient in detail. Risks include but are not limited to bleeding, perforation, adverse reaction to medications, and cardiopulmonary compromise. Sequelae include but are not limited to the possibility of surgery, hospitalization, and mortality. The patient verbalized understanding and wished to proceed.    Lavena Bullion, DO  12/21/2020, 10:05 AM  Gastroenterology Pager [phone]

## 2020-12-21 NOTE — Progress Notes (Signed)
Pt has been inct of urine since her foley has been removed. First random bladder scan = 93 ml, pt has refused additional bladder scans, says it's too painful. Will cont to monitor.

## 2020-12-21 NOTE — Assessment & Plan Note (Addendum)
-   urine culture growing Strep viridans - blood cultures remain negative - bandemia slightly better 8%; continue Rocephin to complete 5 days total (end date 11/15)

## 2020-12-21 NOTE — Plan of Care (Signed)
  Problem: Education: Goal: Knowledge of General Education information will improve Description: Including pain rating scale, medication(s)/side effects and non-pharmacologic comfort measures Outcome: Progressing   Problem: Clinical Measurements: Goal: Ability to maintain clinical measurements within normal limits will improve Outcome: Progressing Goal: Will remain free from infection Outcome: Progressing Goal: Diagnostic test results will improve Outcome: Progressing Goal: Cardiovascular complication will be avoided Outcome: Progressing   Problem: Activity: Goal: Risk for activity intolerance will decrease Outcome: Progressing   Problem: Nutrition: Goal: Adequate nutrition will be maintained Outcome: Progressing   Problem: Elimination: Goal: Will not experience complications related to urinary retention Outcome: Progressing   Problem: Pain Managment: Goal: General experience of comfort will improve Outcome: Progressing   Problem: Safety: Goal: Ability to remain free from injury will improve Outcome: Progressing   Problem: Skin Integrity: Goal: Risk for impaired skin integrity will decrease Outcome: Progressing

## 2020-12-21 NOTE — Anesthesia Preprocedure Evaluation (Addendum)
Anesthesia Evaluation  Patient identified by MRN, date of birth, ID band Patient awake    Reviewed: Allergy & Precautions, H&P , NPO status , Patient's Chart, lab work & pertinent test results  Airway Mallampati: II  TM Distance: >3 FB Neck ROM: Full    Dental  (+) Dental Advisory Given, Chipped,    Pulmonary sleep apnea and Continuous Positive Airway Pressure Ventilation , Current Smoker and Patient abstained from smoking.,    Pulmonary exam normal        Cardiovascular hypertension, Pt. on medications Normal cardiovascular exam     Neuro/Psych PSYCHIATRIC DISORDERS Anxiety Depression    GI/Hepatic GERD  Controlled,  Endo/Other  diabetes, Type 2, Oral Hypoglycemic AgentsMorbid obesity  Renal/GU      Musculoskeletal  (+) Arthritis ,   Abdominal (+) + obese,   Peds  Hematology  (+) anemia ,   Anesthesia Other Findings   Reproductive/Obstetrics                           Anesthesia Physical Anesthesia Plan  ASA: 3  Anesthesia Plan: MAC   Post-op Pain Management:    Induction: Intravenous  PONV Risk Score and Plan: 2 and Propofol infusion and Treatment may vary due to age or medical condition  Airway Management Planned: Nasal Cannula and Natural Airway  Additional Equipment: None  Intra-op Plan:   Post-operative Plan:   Informed Consent: I have reviewed the patients History and Physical, chart, labs and discussed the procedure including the risks, benefits and alternatives for the proposed anesthesia with the patient or authorized representative who has indicated his/her understanding and acceptance.       Plan Discussed with: CRNA and Anesthesiologist  Anesthesia Plan Comments:       Anesthesia Quick Evaluation

## 2020-12-22 ENCOUNTER — Encounter (HOSPITAL_COMMUNITY): Payer: Self-pay | Admitting: Family Medicine

## 2020-12-22 ENCOUNTER — Inpatient Hospital Stay (HOSPITAL_COMMUNITY): Payer: Self-pay | Admitting: Anesthesiology

## 2020-12-22 ENCOUNTER — Encounter (HOSPITAL_COMMUNITY)
Admission: EM | Disposition: A | Discharge status: Home Health | Payer: Self-pay | Source: Home / Self Care | Attending: Internal Medicine

## 2020-12-22 DIAGNOSIS — K635 Polyp of colon: Secondary | ICD-10-CM

## 2020-12-22 DIAGNOSIS — K2970 Gastritis, unspecified, without bleeding: Secondary | ICD-10-CM

## 2020-12-22 DIAGNOSIS — R1084 Generalized abdominal pain: Secondary | ICD-10-CM

## 2020-12-22 HISTORY — PX: COLONOSCOPY WITH PROPOFOL: SHX5780

## 2020-12-22 HISTORY — PX: POLYPECTOMY: SHX5525

## 2020-12-22 HISTORY — PX: BIOPSY: SHX5522

## 2020-12-22 HISTORY — PX: ESOPHAGOGASTRODUODENOSCOPY (EGD) WITH PROPOFOL: SHX5813

## 2020-12-22 LAB — CBC WITH DIFFERENTIAL/PLATELET
Abs Immature Granulocytes: 1.18 10*3/uL — ABNORMAL HIGH (ref 0.00–0.07)
Basophils Absolute: 0.1 10*3/uL (ref 0.0–0.1)
Basophils Relative: 0 %
Eosinophils Absolute: 0.1 10*3/uL (ref 0.0–0.5)
Eosinophils Relative: 1 %
HCT: 26.5 % — ABNORMAL LOW (ref 36.0–46.0)
Hemoglobin: 8.5 g/dL — ABNORMAL LOW (ref 12.0–15.0)
Immature Granulocytes: 9 %
Lymphocytes Relative: 13 %
Lymphs Abs: 1.7 10*3/uL (ref 0.7–4.0)
MCH: 30.1 pg (ref 26.0–34.0)
MCHC: 32.1 g/dL (ref 30.0–36.0)
MCV: 94 fL (ref 80.0–100.0)
Monocytes Absolute: 1 10*3/uL (ref 0.1–1.0)
Monocytes Relative: 7 %
Neutro Abs: 9.6 10*3/uL — ABNORMAL HIGH (ref 1.7–7.7)
Neutrophils Relative %: 70 %
Platelets: 261 10*3/uL (ref 150–400)
RBC: 2.82 MIL/uL — ABNORMAL LOW (ref 3.87–5.11)
RDW: 14.1 % (ref 11.5–15.5)
WBC: 13.6 10*3/uL — ABNORMAL HIGH (ref 4.0–10.5)
nRBC: 1.5 % — ABNORMAL HIGH (ref 0.0–0.2)

## 2020-12-22 LAB — COMPREHENSIVE METABOLIC PANEL
ALT: 18 U/L (ref 0–44)
AST: 35 U/L (ref 15–41)
Albumin: 2.1 g/dL — ABNORMAL LOW (ref 3.5–5.0)
Alkaline Phosphatase: 672 U/L — ABNORMAL HIGH (ref 38–126)
Anion gap: 8 (ref 5–15)
BUN: 24 mg/dL — ABNORMAL HIGH (ref 6–20)
CO2: 27 mmol/L (ref 22–32)
Calcium: 9.3 mg/dL (ref 8.9–10.3)
Chloride: 104 mmol/L (ref 98–111)
Creatinine, Ser: 0.77 mg/dL (ref 0.44–1.00)
GFR, Estimated: >60 mL/min (ref >60)
Glucose, Bld: 137 mg/dL — ABNORMAL HIGH (ref 70–99)
Potassium: 3.6 mmol/L (ref 3.5–5.1)
Sodium: 139 mmol/L (ref 135–145)
Total Bilirubin: 0.6 mg/dL (ref 0.3–1.2)
Total Protein: 6.1 g/dL — ABNORMAL LOW (ref 6.5–8.1)

## 2020-12-22 LAB — GLUCOSE, CAPILLARY
Glucose-Capillary: 134 mg/dL — ABNORMAL HIGH (ref 70–99)
Glucose-Capillary: 126 mg/dL — ABNORMAL HIGH (ref 70–99)
Glucose-Capillary: 167 mg/dL — ABNORMAL HIGH (ref 70–99)
Glucose-Capillary: 136 mg/dL — ABNORMAL HIGH (ref 70–99)

## 2020-12-22 LAB — MAGNESIUM: Magnesium: 2.4 mg/dL (ref 1.7–2.4)

## 2020-12-22 SURGERY — COLONOSCOPY WITH PROPOFOL
Anesthesia: Monitor Anesthesia Care

## 2020-12-22 MED ORDER — ACETAMINOPHEN 325 MG PO TABS: 650.0000 mg | ORAL_TABLET | ORAL | Status: DC | PRN

## 2020-12-22 MED ORDER — LACTATED RINGERS IV SOLN
INTRAVENOUS | Status: AC | PRN
  Administered 2020-12-22: 1000 mL via INTRAVENOUS

## 2020-12-22 MED ORDER — POLYETHYLENE GLYCOL 3350 17 G PO PACK
17.0000 g | PACK | Freq: Two times a day (BID) | ORAL | Status: DC
  Administered 2020-12-22 – 2020-12-23 (×2): 17 g via ORAL
  Filled 2020-12-22 (×2): qty 1

## 2020-12-22 MED ORDER — PROPOFOL 500 MG/50ML IV EMUL
INTRAVENOUS | Status: DC | PRN
  Administered 2020-12-22: 135 ug/kg/min via INTRAVENOUS

## 2020-12-22 MED ORDER — LACTULOSE 10 GM/15ML PO SOLN
20.0000 g | Freq: Two times a day (BID) | ORAL | Status: DC | PRN
  Administered 2021-01-06 – 2021-01-11 (×3): 20 g via ORAL
  Filled 2020-12-22 (×4): qty 30

## 2020-12-22 MED ORDER — PROPOFOL 500 MG/50ML IV EMUL
INTRAVENOUS | Status: AC
  Filled 2020-12-22: qty 50

## 2020-12-22 MED ORDER — PROPOFOL 10 MG/ML IV BOLUS
INTRAVENOUS | Status: AC
  Filled 2020-12-22: qty 20

## 2020-12-22 MED ORDER — OXYCODONE HCL ER 15 MG PO T12A
30.0000 mg | EXTENDED_RELEASE_TABLET | Freq: Two times a day (BID) | ORAL | Status: DC
  Administered 2020-12-22 – 2020-12-23 (×3): 30 mg via ORAL
  Filled 2020-12-22 (×3): qty 2

## 2020-12-22 MED ORDER — PROPOFOL 10 MG/ML IV BOLUS
INTRAVENOUS | Status: DC | PRN
  Administered 2020-12-22: 20 mg via INTRAVENOUS

## 2020-12-22 SURGICAL SUPPLY — 25 items

## 2020-12-22 NOTE — Transfer of Care (Signed)
Immediate Anesthesia Transfer of Care Note  Patient: Erin [name]  Procedure(s) Performed: COLONOSCOPY WITH PROPOFOL ESOPHAGOGASTRODUODENOSCOPY (EGD) WITH PROPOFOL POLYPECTOMY BIOPSY  Patient Location: PACU  Anesthesia Type:MAC  Level of Consciousness: awake  Airway & Oxygen Therapy: Patient Spontanous Breathing and Patient connected to face mask oxygen  Post-op Assessment: Report given to RN, Post -op Vital signs reviewed and stable and Patient moving all extremities X 4  Post vital signs: Reviewed and stable  Last Vitals:  Vitals Value Taken Time  BP 131/66   Temp    Pulse 82 12/22/20 1107  Resp 21 12/22/20 1107  SpO2 99 % 12/22/20 1107  Vitals shown include unvalidated device data.  Last Pain:  Vitals:   12/22/20 0928  TempSrc: Oral  PainSc: 2       Patients Stated Pain Goal: 2 (16/10/96 0454)  Complications: No notable events documented.

## 2020-12-22 NOTE — Op Note (Signed)
[facility] Patient Name: Erin [name] Procedure Date: 12/22/2020 MRN: [id number] Attending MD: Gerrit Heck , MD Date of Birth: Jul 16, 1965 CSN: [id number] Age: 55 Admit Type: Inpatient Procedure:                Colonoscopy Indications:              Generalized abdominal pain, Abnormal CT of the GI                            tract                           55 yo female with recent imaging notable for                            urethral, cervical, and ovarian masses with                            biopsies demonstrating with poorly differentiated                            adenocarcinoma with signet ring features suggestive                            of lower GI tract origin. 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                            Elevated CEA level at 1864. Plan for endoscopic                            evaluation for primary source of malignancy. Providers:                Gerrit Heck, MD, Tory Emerald, RN, Cherylynn Ridges, Technician, Maudry Diego, CRNA Referring MD:              Medicines:                Monitored Anesthesia Care Complications:            No immediate complications. Estimated Blood Loss:     Estimated blood loss was minimal. Procedure:                Pre-Anesthesia Assessment:                           - Prior to the procedure, a History and Physical                            was performed, and patient medications and  allergies were reviewed. The patient's tolerance of                            previous anesthesia was also reviewed. The risks                            and benefits of the procedure and the sedation                            options and risks were discussed with the patient.                            All questions were  answered, and informed consent                            was obtained. Prior Anticoagulants: The patient has                            taken no previous anticoagulant or antiplatelet                            agents. ASA Grade Assessment: III - A patient with                            severe systemic disease. After reviewing the risks                            and benefits, the patient was deemed in                            satisfactory condition to undergo the procedure.                           - Prior to the procedure, a History and Physical                            was performed, and patient medications and                            allergies were reviewed. The patient's tolerance of                            previous anesthesia was also reviewed. The risks                            and benefits of the procedure and the sedation                            options and risks were discussed with the patient.                            All questions were answered, and informed consent  was obtained. Prior Anticoagulants: The patient has                            taken no previous anticoagulant or antiplatelet                            agents. ASA Grade Assessment: III - A patient with                            severe systemic disease. After reviewing the risks                            and benefits, the patient was deemed in                            satisfactory condition to undergo the procedure.                           After obtaining informed consent, the colonoscope                            was passed under direct vision. Throughout the                            procedure, the patient's blood pressure, pulse, and                            oxygen saturations were monitored continuously. The                            CF-HQ190L ([id number] Olympus colonoscope was                            introduced through the anus and advanced to the the                             cecum, identified by appendiceal orifice and                            ileocecal valve. The colonoscopy was technically                            difficult and complex due to significant looping.                            The patient tolerated the procedure well. The                            quality of the bowel preparation was adequate. The                            ileocecal valve, appendiceal orifice, and rectum  were photographed. Scope In: 9:53:55 AM Scope Out: 10:47:40 AM Scope Withdrawal Time: 0 hours 34 minutes 25 seconds  Total Procedure Duration: 0 hours 53 minutes 45 seconds  Findings:      The perianal and digital rectal examinations were normal.      A 12 mm polyp was found in the ascending colon. The polyp was       pedunculated. The polyp was removed with a hot snare. Resection and       retrieval were complete. Estimated blood loss: none.      Four sessile polyps were found in the sigmoid colon. The polyps were 3       to 5 mm in size. These polyps were removed with a cold snare. Resection       and retrieval were complete. Estimated blood loss was minimal.      A few small-mouthed diverticula were found in the ascending colon.      The ascending colon revealed significantly excessive looping. Advancing       the scope required using manual pressure. Able to visualize the       appendiceal orifice and cecum. Unable to intubate the terminal ileum due       to significant looping in the right colon, body habitus, and anatomy       (proximal facing ICV). The colonoscope was then slowly withdrawn with       careful inspection of the entire luminal surface. There was an area of       suspected extraluminal compression in the distal sigmoid colon at 25 cm       from the anal verge, presumably mass effect from known pelvic lesions.       The overlying mucosa was otherwise normal appearing. Aside from polyps       as  described above, no other pathology noted on this study.      The retroflexed view of the distal rectum and anal verge was normal and       showed no anal or rectal abnormalities. Impression:               - One 12 mm polyp in the ascending colon, removed                            with a hot snare. Resected and retrieved.                           - Four 3 to 5 mm polyps in the sigmoid colon,                            removed with a cold snare. Resected and retrieved.                           - Diverticulosis in the ascending colon.                           - There was significant looping of the colon.                           - The distal rectum and anal verge are normal on  retroflexion view. Moderate Sedation:      Not Applicable - Patient had care per Anesthesia. Recommendation:           - Await pathology results.                           - Perform an upper GI endoscopy today.                           - Additional recommendations pending EGD findings. Procedure Code(s):        --- Professional ---                           [phone], Colonoscopy, flexible; with removal of                            tumor(s), polyp(s), or other lesion(s) by snare                            technique Diagnosis Code(s):        --- Professional ---                           K63.5, Polyp of colon                           R10.84, Generalized abdominal pain                           K57.30, Diverticulosis of large intestine without                            perforation or abscess without bleeding                           R93.3, Abnormal findings on diagnostic imaging of                            other parts of digestive tract CPT copyright 2019 [employer]. All rights reserved. The codes documented in this report are preliminary and upon coder review may  be revised to meet current compliance requirements. Gerrit Heck, MD 12/22/2020 11:33:16  AM Number of Addenda: 0

## 2020-12-22 NOTE — Progress Notes (Signed)
Progress Note    Erin [name]   TMH:[id number]  DOB: Aug 19, 1965  DOA: 12/16/2020     6 PCP: Pcp, No  Initial CC: abdominal pain  Hospital Course: Erin [name] is a 55 y.o. female with PMH OSA on CPAP, HTN, DMII, morbid obesity who presented with worsening abdominal pain and left thigh pain.   She has been undergoing workup for recently discovered cervical/urethral masses on CT in Sept 2022.  Further imaging showed ongoing interval enlargement of the left adnexal area.  There was also associated retroperitoneal and pelvic adenopathy. MRI pelvis also performed on 12/05/2020 showed poorly enhancing mass centered at the urethra, solid enhancing bilateral ovarian masses, poorly enhancing mass involving left uterine cervix, and significant adenopathy involving bilateral common iliac, bilateral external iliac, bilateral inguinal lymph nodes.  Also noted to have pelvic osseous lesions concerning for metastatic disease as well.  She was admitted for biopsy with urology and GYN oncology.  She was able to undergo cystoscopy with urethral and bladder biopsy on 12/17/2020.  She also underwent cervical biopsies following this procedure.  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  Interval History:  Seen in her room this afternoon. She was awake, alert and joking around in her usual pleasant self. Unfortunately still says pain is uncontrolled and endorsed a 10/10. I told her we'd further adjust her regimen as noted below. Also, unable to see any masses on EGD/CLN so next steps for workup/treatment are to be determined by oncology.  Encouraged her to continue working with PT as able as well. She lives alone so other issue will be figuring out disposition.   Assessment & Plan: * Mass of urethra - s/p MRI pelvis on 12/05/20 -Biopsy taken during cystoscopy on 12/17/2020 - biopsy has returned on 11/11. Noted to have poorly differentiated  adenocarcinoma with signet ring cell features on all biopsies  - Continue Foley in place; urology following.  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  Suspicion now confirmed for primary GI malignancy and will engage GI for further evaluation as she may need EGD and colonoscopy at this point.  Medical oncology also consulted given biopsy findings. - palliative care consulted as well for [place] and also assistance with pain control  - no masses found on EGD/CLN on 11/13; see gastritis  Elevated CEA - patient being followed by urology and GYN onc initially. Now with biopsy results back, will consult GI and med onc - CEA 1864 and CA-125 is 29 on admission  Ovarian mass - s/p MRI pelvis on 12/05/20 showing several masses - now s/p cervical, uretheral, and bladder biopsy on 11/8  Gastritis and gastroduodenitis - 11/13 CLN: 5 polyps removed (1 in ascending, 4 in sigmoid); diverticulosis noted - 11/13 EGD: gastritis only; 6 weeks BID PPI then daily - no masses found to explain path results of adenocarcinoma with signet ring cells - may need to depend on PET scan to show other areas of focus   Abdominal pain - Presumed due to underlying multiple masses with significant lymphadenopathy and pelvic osseous lesions - Currently pain control is her biggest issue as well - continue fentanyl patch - only using 2-3 doses dilaudid a day so this is reasonable to continue - d/c q6h oxyIR 10mg  and change to Oxycontin 30 mg BID and monitor response - will further adjust depending on  response  Acute lower UTI - urine culture growing Strep viridans - blood cultures remain negative - bandemia up to 9% today; continue trending  - continue rocephin  Constipation - Underwent colon prep successfully on repeat prepping -Continue laxative regimen in  setting of underlying opioid use  Malnutrition of moderate degree - Moderate malnutrition in context of acute illness/injury, morbid obesity - Evaluated by dietitian, appreciate assistance - per RD: "Moderate Malnutrition related to acute illness as evidenced by percent weight loss, moderate muscle depletion, energy intake < 75% for > 7 days."  Hypokalemia - Replete as needed  OSA (obstructive sleep apnea) - Continue nightly CPAP  Diabetes mellitus ([place]) - continue SSI and CBG monitoring   Essential hypertension - Initially elevated on admission, has since improved.  Possibly from some uncontrolled pain initially; suspect her spikes are in setting of acute pain - Continue monitoring pressure and will treat further as needed - continue amlodipine and coreg     Old records reviewed in assessment of this patient  Antimicrobials:   DVT prophylaxis: Lovenox  Code Status:   Code Status: Full Code  Disposition Plan: Status is: Inpatient   Objective: Blood pressure (!) 154/101, pulse 94, temperature 97.8 F (36.6 C), temperature source Oral, resp. rate 14, height 5\' 4"  (1.626 m), weight 127 kg, SpO2 99 %.  Examination:  Physical Exam Constitutional:      General: She is not in acute distress.    Appearance: She is well-developed. She is obese.  HENT:     Head: Normocephalic and atraumatic.     Mouth/Throat:     Mouth: Mucous membranes are moist.  Eyes:     Extraocular Movements: Extraocular movements intact.  Cardiovascular:     Rate and Rhythm: Normal rate and regular rhythm.  Pulmonary:     Effort: Pulmonary effort is normal.     Breath sounds: Normal breath sounds.  Abdominal:     Comments: Obese, slightly distended, hypoactive bowel sounds, nonspecific tenderness but worse in lower quadrants  Musculoskeletal:        General: Normal range of motion.     Cervical back: Normal range of motion and neck supple.  Skin:    General: Skin is warm and dry.   Neurological:     General: No focal deficit present.     Mental Status: She is alert.  Psychiatric:        Mood and Affect: Mood normal.        Behavior: Behavior normal.     Consultants:  Urology GYN oncology GI Medical oncology Palliative care  Procedures:  Diagnostic cystoscopy, bladder biopsy, urethral biopsy, 12/17/20 Cervical biopsies, 12/17/2020 EGD and colonoscopy 12/22/20  Data Reviewed: I have personally reviewed labs and imaging studies     LOS: 6 days  Time spent: Greater than 50% of the 35 minute visit was spent in counseling/coordination of care for the patient as laid out in the A&P.   Dwyane Dee, MD [facility] 12/22/2020, 2:31 PM

## 2020-12-22 NOTE — Anesthesia Postprocedure Evaluation (Signed)
Anesthesia Post Note  Patient: Erin [name]  Procedure(s) Performed: COLONOSCOPY WITH PROPOFOL ESOPHAGOGASTRODUODENOSCOPY (EGD) WITH PROPOFOL POLYPECTOMY BIOPSY     Patient location during evaluation: PACU Anesthesia Type: MAC Level of consciousness: awake and alert Pain management: pain level controlled Vital Signs Assessment: post-procedure vital signs reviewed and stable Respiratory status: spontaneous breathing, nonlabored ventilation and respiratory function stable Cardiovascular status: stable and blood pressure returned to baseline Anesthetic complications: no   No notable events documented.  Last Vitals:  Vitals:   12/22/20 1221 12/22/20 1310  BP: (!) 162/86 (!) 154/101  Pulse: 86 94  Resp: 14   Temp: 36.6 C 36.6 C  SpO2: 99% 99%    Last Pain:  Vitals:   12/22/20 1310  TempSrc: Oral  PainSc:                  Audry Pili

## 2020-12-22 NOTE — Anesthesia Procedure Notes (Signed)
Procedure Name: MAC Date/Time: 12/22/2020 9:47 AM Performed by: Niel Hummer, CRNA Pre-anesthesia Checklist: Patient identified, Emergency Drugs available, Suction available and Patient being monitored Oxygen Delivery Method: Simple face mask

## 2020-12-22 NOTE — Progress Notes (Signed)
Spoke w pt's MIL today, she voiced concerns that pt lives alone and all family live out of town. They are currently trying to find someone to assist w pt in the home. Pt cont to be incont of urine and stool, pt also cont to request that staff feed her her meals. Enc pt to be as independent as possible, she verbalized understanding. Later noted a visitor feeding the pt. Will cont to monitor.

## 2020-12-22 NOTE — Interval H&P Note (Signed)
History and Physical Interval Note:  12/22/2020 9:34 AM  Nolene Bernheim  has presented today for surgery, with the diagnosis of malignancy.  The various methods of treatment have been discussed with the patient and family. After consideration of risks, benefits and other options for treatment, the patient has consented to  Procedure(s): COLONOSCOPY WITH PROPOFOL (N/A) ESOPHAGOGASTRODUODENOSCOPY (EGD) WITH PROPOFOL (N/A) as a surgical intervention.  The patient's history has been reviewed, patient examined, no change in status, stable for surgery.  I have reviewed the patient's chart and labs.  Questions were answered to the patient's satisfaction.     Dominic Pea Phala Schraeder

## 2020-12-22 NOTE — Op Note (Signed)
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                            Elevated CEA level at 1864. Plan for endoscopic                            evaluation for primary source of malignancy. Providers:                Gerrit Heck, MD, Elmer Ramp. Tilden Dome, RN, Cherylynn Ridges, Technician, Maudry Diego, CRNA Referring MD:              Medicines:                Monitored Anesthesia Care Complications:            No immediate complications. Estimated Blood Loss:     Estimated blood loss was minimal. Procedure:                Pre-Anesthesia Assessment:                           - Prior to the procedure, a History and Physical                            was  performed, and patient medications and                            allergies were reviewed. The patient's tolerance of                            previous anesthesia was also reviewed. The risks                            and benefits of the procedure and the sedation                            options and risks were discussed with the patient.                             All questions were answered, and informed consent                            was obtained. Prior Anticoagulants: The patient has                            taken no previous anticoagulant or antiplatelet                            agents. ASA Grade Assessment: III - A patient with                            severe systemic disease. After reviewing the risks                            and benefits, the patient was deemed in                            satisfactory condition to undergo the procedure.                           After obtaining informed consent, the endoscope was                            passed under direct vision. Throughout the                            procedure, the patient's blood pressure, pulse, and                            oxygen saturations were monitored continuously. The                            GIF-H190 ([id number]) Olympus endoscope was introduced                            through the mouth, and advanced to the second part  of duodenum. The upper GI endoscopy was                            accomplished without difficulty. The patient                            tolerated the procedure well. Scope In: Scope Out: Findings:      The examined esophagus was normal.      Scattered areas of localized inflammation characterized by congestion       (edema) and erosions was found in the gastric fundus and in the gastric       body. These had a nodular type contour, but appeared mucosal based and       not submucosal or mass-like. There was small amounts of scattered heme       in the proximal stomach, but no active bleeding nor high grade stigmata       of bleeding. Several mucosal biopsies were taken with a cold forceps for       histology. Estimated blood loss was minimal.      Localized mild inflammation characterized by erythema was found in the       prepyloric region of the stomach. Biopsies were taken with a cold       forceps for  histology. Estimated blood loss was minimal.      The examined duodenum was normal. Impression:               - Normal esophagus.                           - Gastritis. Biopsied.                           - Gastritis. Biopsied.                           - Normal examined duodenum. Moderate Sedation:      Not Applicable - Patient had care per Anesthesia. Recommendation:           - Return patient to hospital ward for ongoing care.                           - Advance diet as tolerated.                           - Use Protonix (pantoprazole) 40 mg PO BID for 6                            weeks to promote mucosal healing, then reduce to 40                            mg daily.                           - Await pathology results. Procedure Code(s):        --- Professional ---                           [phone], Esophagogastroduodenoscopy,  flexible,                            transoral; with biopsy, single or multiple Diagnosis Code(s):        --- Professional ---                           K29.70, Gastritis, unspecified, without bleeding                           R93.3, Abnormal findings on diagnostic imaging of                            other parts of digestive tract CPT copyright 2019 [employer]. All rights reserved. The codes documented in this report are preliminary and upon coder review may  be revised to meet current compliance requirements. Gerrit Heck, MD 12/22/2020 11:21:13 AM Number of Addenda: 0

## 2020-12-22 NOTE — Assessment & Plan Note (Addendum)
-   11/13 CLN: 5 polyps removed (1 in ascending, 4 in sigmoid); diverticulosis noted - 11/13 EGD: gastritis only; 6 weeks BID PPI then daily - no masses found to explain path results of adenocarcinoma with signet ring cells -Follow-up biopsies from scopes - may need to depend on PET scan to show other areas of focus

## 2020-12-23 ENCOUNTER — Encounter: Payer: Self-pay | Admitting: *Deleted

## 2020-12-23 ENCOUNTER — Encounter (HOSPITAL_COMMUNITY): Payer: Self-pay | Admitting: Gastroenterology

## 2020-12-23 LAB — CULTURE, BLOOD (ROUTINE X 2)
Culture: NO GROWTH
Culture: NO GROWTH
Special Requests: ADEQUATE
Special Requests: ADEQUATE

## 2020-12-23 LAB — CBC WITH DIFFERENTIAL/PLATELET
Abs Immature Granulocytes: 1 10*3/uL — ABNORMAL HIGH (ref 0.00–0.07)
Basophils Absolute: 0.1 10*3/uL (ref 0.0–0.1)
Basophils Relative: 1 %
Eosinophils Absolute: 0.1 10*3/uL (ref 0.0–0.5)
Eosinophils Relative: 1 %
HCT: 25.7 % — ABNORMAL LOW (ref 36.0–46.0)
Hemoglobin: 8.1 g/dL — ABNORMAL LOW (ref 12.0–15.0)
Immature Granulocytes: 8 %
Lymphocytes Relative: 15 %
Lymphs Abs: 1.9 10*3/uL (ref 0.7–4.0)
MCH: 30.3 pg (ref 26.0–34.0)
MCHC: 31.5 g/dL (ref 30.0–36.0)
MCV: 96.3 fL (ref 80.0–100.0)
Monocytes Absolute: 0.9 10*3/uL (ref 0.1–1.0)
Monocytes Relative: 7 %
Neutro Abs: 8.9 10*3/uL — ABNORMAL HIGH (ref 1.7–7.7)
Neutrophils Relative %: 68 %
Platelets: 270 10*3/uL (ref 150–400)
RBC: 2.67 MIL/uL — ABNORMAL LOW (ref 3.87–5.11)
RDW: 14.5 % (ref 11.5–15.5)
WBC: 12.8 10*3/uL — ABNORMAL HIGH (ref 4.0–10.5)
nRBC: 1.9 % — ABNORMAL HIGH (ref 0.0–0.2)

## 2020-12-23 LAB — COMPREHENSIVE METABOLIC PANEL
ALT: 15 U/L (ref 0–44)
AST: 29 U/L (ref 15–41)
Albumin: 2.1 g/dL — ABNORMAL LOW (ref 3.5–5.0)
Alkaline Phosphatase: 631 U/L — ABNORMAL HIGH (ref 38–126)
Anion gap: 8 (ref 5–15)
BUN: 21 mg/dL — ABNORMAL HIGH (ref 6–20)
CO2: 26 mmol/L (ref 22–32)
Calcium: 9.4 mg/dL (ref 8.9–10.3)
Chloride: 104 mmol/L (ref 98–111)
Creatinine, Ser: 0.8 mg/dL (ref 0.44–1.00)
GFR, Estimated: >60 mL/min (ref >60)
Glucose, Bld: 119 mg/dL — ABNORMAL HIGH (ref 70–99)
Potassium: 3.8 mmol/L (ref 3.5–5.1)
Sodium: 138 mmol/L (ref 135–145)
Total Bilirubin: 0.6 mg/dL (ref 0.3–1.2)
Total Protein: 6.1 g/dL — ABNORMAL LOW (ref 6.5–8.1)

## 2020-12-23 LAB — GLUCOSE, CAPILLARY
Glucose-Capillary: 119 mg/dL — ABNORMAL HIGH (ref 70–99)
Glucose-Capillary: 173 mg/dL — ABNORMAL HIGH (ref 70–99)
Glucose-Capillary: 128 mg/dL — ABNORMAL HIGH (ref 70–99)
Glucose-Capillary: 152 mg/dL — ABNORMAL HIGH (ref 70–99)

## 2020-12-23 LAB — MAGNESIUM: Magnesium: 2.3 mg/dL (ref 1.7–2.4)

## 2020-12-23 MED ORDER — OXYCODONE HCL 5 MG PO TABS
10.0000 mg | ORAL_TABLET | ORAL | Status: DC | PRN
  Administered 2020-12-23 – 2020-12-29 (×22): 10 mg via ORAL
  Filled 2020-12-23 (×22): qty 2

## 2020-12-23 MED ORDER — SODIUM CHLORIDE 0.9 % IV SOLN
1.0000 g | INTRAVENOUS | Status: AC
  Administered 2020-12-23 – 2020-12-24 (×2): 1 g via INTRAVENOUS
  Filled 2020-12-23 (×2): qty 10

## 2020-12-23 MED ORDER — OXYCODONE HCL ER 40 MG PO T12A
40.0000 mg | EXTENDED_RELEASE_TABLET | Freq: Two times a day (BID) | ORAL | Status: DC
  Administered 2020-12-23 – 2021-01-14 (×45): 40 mg via ORAL
  Filled 2020-12-23 (×5): qty 1
  Filled 2020-12-23: qty 2
  Filled 2020-12-23: qty 1
  Filled 2020-12-23: qty 2
  Filled 2020-12-23: qty 1
  Filled 2020-12-23 (×2): qty 2
  Filled 2020-12-23 (×8): qty 1
  Filled 2020-12-23: qty 2
  Filled 2020-12-23 (×18): qty 1
  Filled 2020-12-23: qty 2
  Filled 2020-12-23 (×2): qty 1
  Filled 2020-12-23: qty 2
  Filled 2020-12-23 (×2): qty 1
  Filled 2020-12-23: qty 2

## 2020-12-23 NOTE — Progress Notes (Addendum)
HEMATOLOGY-ONCOLOGY PROGRESS NOTE  SUBJECTIVE: Had increased pain this morning but pain is better at the time my visit as she just received pain medication.  She has no other specific complaints today.  PHYSICAL EXAMINATION:  Vitals:   12/22/20 2224 12/23/20 0550  BP: (!) 148/81 133/85  Pulse: 95 90  Resp: 18 18  Temp: 99.9 F (37.7 C) 98.6 F (37 C)  SpO2: 93% 95%   Filed Weights   12/16/20 1307 12/17/20 0841 12/22/20 0928  Weight: 127 kg 127 kg 127 kg    Intake/Output from previous day: 11/13 0701 - 11/14 0700 In: 2030 [P.O.:1230; I.V.:700; IV Piggyback:100] Out: 850 [Urine:850]  GENERAL:alert, no distress and comfortable SKIN: skin color, texture, turgor are normal, no rashes or significant lesions EYES: normal, Conjunctiva are pink and non-injected, sclera clear OROPHARYNX:no exudate, no erythema and lips, buccal mucosa, and tongue normal  LUNGS: clear to auscultation and percussion with normal breathing effort HEART: regular rate & rhythm and no murmurs and no lower extremity edema ABDOMEN: Positive bowel sounds, soft, tenderness over the lower abdomen NEURO: alert & oriented x 3 with fluent speech, no focal motor/sensory deficits  LABORATORY DATA:  I have reviewed the data as listed CMP Latest Ref Rng & Units 12/23/2020 12/22/2020 12/21/2020  Glucose 70 - 99 mg/dL 119(H) 137(H) 140(H)  BUN 6 - 20 mg/dL 21(H) 24(H) 26(H)  Creatinine 0.44 - 1.00 mg/dL 0.80 0.77 0.66  Sodium 135 - 145 mmol/L 138 139 136  Potassium 3.5 - 5.1 mmol/L 3.8 3.6 4.2  Chloride 98 - 111 mmol/L 104 104 101  CO2 22 - 32 mmol/L 26 27 27   Calcium 8.9 - 10.3 mg/dL 9.4 9.3 9.6  Total Protein 6.5 - 8.1 g/dL 6.1(L) 6.1(L) 6.1(L)  Total Bilirubin 0.3 - 1.2 mg/dL 0.6 0.6 0.4  Alkaline Phos 38 - 126 U/L 631(H) 672(H) 684(H)  AST 15 - 41 U/L 29 35 39  ALT 0 - 44 U/L 15 18 18     Lab Results  Component Value Date   WBC 12.8 (H) 12/23/2020   HGB 8.1 (L) 12/23/2020   HCT 25.7 (L) 12/23/2020   MCV  96.3 12/23/2020   PLT 270 12/23/2020   NEUTROABS 8.9 (H) 12/23/2020    DG Chest 2 View  Result Date: 12/16/2020 CLINICAL DATA:  Shortness of breath in a 55 year old female. EXAM: CHEST - 2 VIEW COMPARISON:  July 10, 2009. FINDINGS: Linear opacities in the LEFT chest of progressed slightly since the previous study. No lobar consolidation. Cardiomediastinal contours and hilar structures are stable. No sign of effusion. Mildly increased density in the subcarinal region on lateral projection. No acute skeletal process on limited assessment. IMPRESSION: Signs of scarring or atelectasis in the LEFT chest, slightly progressed since the previous study. Question developing subcarinal adenopathy, based on lateral projection. Developing lower lobe airspace disease is also a differential consideration. In a patient with pelvic masses on recent pelvic MRI would consider follow-up chest CT for further evaluation. Electronically Signed   By: Zetta Bills M.D.   On: 12/16/2020 14:13   CT CHEST W CONTRAST  Result Date: 12/17/2020 CLINICAL DATA:  Cancer of unknown primary.  Staging. EXAM: CT CHEST WITH CONTRAST TECHNIQUE: Multidetector CT imaging of the chest was performed during intravenous contrast administration. CONTRAST:  47mL OMNIPAQUE IOHEXOL 350 MG/ML SOLN COMPARISON:  Chest CT dated 06/01/2007. FINDINGS: Cardiovascular: There is no cardiomegaly or pericardial effusion. Three-vessel coronary vascular calcification. Mild atherosclerotic calcification of the thoracic aorta. No aneurysmal dilatation. The origins of  the great vessels of the aortic arch appear patent as visualized. There is dilatation of the main pulmonary trunk suggestive of pulmonary hypertension. Evaluation of the pulmonary arteries is limited due to respiratory motion artifact and suboptimal opacification and timing of the contrast. No large or central pulmonary artery embolus identified. Mediastinum/Nodes: No hilar or mediastinal adenopathy. The  esophagus is grossly unremarkable. Prior right hemithyroidectomy. No mediastinal fluid collection. Lungs/Pleura: Bibasilar subpleural atelectasis/scarring. There is no pleural effusion pneumothorax. The central airways are patent. Upper Abdomen: Indeterminate 3 cm left renal nodule, present on the prior CT, likely a benign etiology such as adenoma. Musculoskeletal: Degenerative changes of the spine. No acute osseous pathology. IMPRESSION: 1. No acute intrathoracic pathology. No CT evidence of central pulmonary artery embolus. 2. Dilatation of the main pulmonary trunk suggestive of pulmonary hypertension. 3. Status post prior right hemithyroidectomy. 4. Bilateral linear atelectasis/scarring. 5. Aortic Atherosclerosis (ICD10-I70.0). Electronically Signed   By: Anner Crete M.D.   On: 12/17/2020 21:09   MR Lumbar Spine W Wo Contrast  Result Date: 12/16/2020 CLINICAL DATA:  Initial evaluation for low back pain, cancer suspected. EXAM: MRI LUMBAR SPINE WITHOUT AND WITH CONTRAST TECHNIQUE: Multiplanar and multiecho pulse sequences of the lumbar spine were obtained without and with intravenous contrast. CONTRAST:  78mL GADAVIST GADOBUTROL 1 MMOL/ML IV SOLN COMPARISON:  Prior CT from earlier the same day. FINDINGS: Segmentation: Standard. Lowest well-formed disc space labeled the L5-S1 level. Alignment: 4 mm facet mediated anterolisthesis of L4 on L5. Alignment otherwise normal with preservation of the normal lumbar lordosis. Vertebrae: Diffusely abnormal appearance of the bone marrow is seen throughout the visualized lumbar spine and pelvis. Associated heterogeneous STIR hyperintensity with irregular heterogeneous postcontrast enhancement. Findings are highly suspicious for diffuse osseous metastatic disease. Involvement appears to be most pronounced within the T12 vertebral body as well as the visualized pelvis. Exact measurements of a discrete lesion is difficult given the overall infiltrative appearance of this  finding. No associated pathologic fracture. No visible extra osseous extension of tumor at this time. Conus medullaris and cauda equina: Conus extends to the L1 level. Conus and cauda equina appear normal. No visible epidural or intracanalicular tumor. Paraspinal and other soft tissues: Mild edema within the subcutaneous fat of the lower back, which could be related to overall volume status. Paraspinous soft tissues demonstrate no other acute finding. Retroperitoneal/iliac adenopathy noted. Left larger than right adnexal masses partially visualized. Findings better characterized on prior CT. Disc levels: L1-2:  Unremarkable. L2-3: Disc desiccation with mild disc bulge. Superimposed small right foraminal to extraforaminal disc protrusion (series 11, image 17). Mild facet hypertrophy. Underlying short pedicles with a degree of mild spinal stenosis. Mild bilateral L2 foraminal narrowing. L3-4: Disc desiccation with mild disc bulge. Superimposed shallow left extraforaminal disc protrusion with annular fissure (series 11, image 24). Mild facet hypertrophy. Underlying short pedicles. Mild spinal stenosis. Foramina remain patent. L4-5: 4 mm anterolisthesis. Disc desiccation with broad-based posterior pseudo disc bulge. Biforaminal annular fissures noted, left larger than right. Moderate bilateral facet arthrosis. Resultant moderate spinal stenosis. Mild to moderate bilateral L4 foraminal narrowing. L5-S1: Normal interspace. Mild right greater than left facet hypertrophy. No stenosis. IMPRESSION: 1. 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 No associated pathologic fracture or extra osseous extension of tumor at this time. 2. Retroperitoneal/iliac adenopathy with left larger than right adnexal masses, better characterized on prior CT. 3. Underlying multilevel degenerative spondylosis as above, most pronounced  at L4-5 where there is resultant moderate spinal stenosis. Electronically Signed   By: Jeannine Boga M.D.   On: 12/16/2020 23:44   MR PELVIS W WO CONTRAST  Result Date: 12/05/2020 CLINICAL DATA:  55 year old female with indeterminate hypoechoic left adnexal mass identified in the setting of left lower quadrant pain. EXAM: MRI PELVIS WITHOUT AND WITH CONTRAST TECHNIQUE: Multiplanar multisequence MR imaging of the pelvis was performed both before and after administration of intravenous contrast. CONTRAST:  69mL GADAVIST GADOBUTROL 1 MMOL/ML IV SOLN COMPARISON:  12/01/2020 pelvic sonogram and unenhanced CT abdomen/pelvis. FINDINGS: Urinary Tract: Mild diffuse bladder wall thickening. There is a poorly marginated enhancing solid 3.7 x 2.8 x 3.0 cm mass centered at the urethra (series 32/image 42 and series 30/image 67), extending from the vesicourethral junction throughout the length of the urethra. There appears to be an underlying right periurethral diverticulum (series 5/image 27). At the inferior tip of the urethra, there is a simple 0.9 x 0.9 x 0.9 cm cystic lesion compatible with a Skene's duct cyst. Bowel: Visualized small and large bowel are normal caliber with no bowel wall thickening. Vascular/Lymphatic: Mild bilateral inguinal lymphadenopathy measuring up to 1.4 cm short axis diameter on the right (series 19/image 71) and 1.4 cm on the left (series 19/image 70). Moderate bilateral external iliac lymphadenopathy measuring up to 1.7 cm bilaterally (series 19/image 53 on the right and 56 on the left). Bilateral common iliac lymphadenopathy, largest 1.6 cm on the right (series 19/image 27) and 1.7 cm on the left (series 19/image 14). Reproductive: Uterus: The anteverted uterus measures 8.2 x 3.7 x 5.5 cm. No uterine fibroids. Inner myometrium (junctional zone) measures 12 mm in thickness, which is compatible with mild diffuse uterine adenomyosis. Endometrium measures 4 mm in bilayer thickness, which  is within normal limits. No endometrial cavity fluid or focal endometrial mass. Poorly marginated enhancing solid mass of the left uterine cervix measuring 2.4 x 2.3 x 3.0 cm (series 5/image 21 and series 8/image 22), which appears to disrupt the normal cervical fibrous stroma without frank parametrial invasion. Ovaries and Adnexa: The right ovary measures 4.0 x 2.4 x 4.3 cm and contains multiple solid avidly enhancing masses, largest 2.3 x 2.0 cm (series 19/image 33). The left ovary measures 8.3 x 7.7 x 8.5 cm and is replaced by a predominantly solid avidly enhancing mass. Other: No abnormal free fluid in the pelvis. No focal pelvic fluid collection. Musculoskeletal: Diffuse patchy confluent nodular replacement of the pelvic osseous structures, best seen on axial series 7. IMPRESSION: 1. Poorly marginated enhancing 3.7 x 2.8 x 3.0 cm mass centered at the urethra, extending from the vesicourethral junction throughout the length of the female urethra, with probable underlying right periurethral diverticulum. This mass is concerning for malignancy. 2. Solid avidly enhancing bilateral ovarian masses, largest 2.3 cm on the right and 8.3 cm on the left, worrisome for bilateral ovarian metastases. 3. Poorly marginated enhancing 2.4 x 2.3 x 3.0 cm mass in the left uterine cervix, which appears to disrupt the normal cervical fibrous stroma without frank parametrial invasion, suspicious for malignancy. 4. Mild-to-moderate bilateral common iliac, bilateral external iliac and bilateral inguinal lymphadenopathy, suspicious for metastatic disease. 5. Diffuse patchy confluent nodular replacement of the pelvic osseous structures, suspicious for osseous metastatic disease. 6. Mild diffuse uterine adenomyosis. Electronically Signed   By: Ilona Sorrel M.D.   On: 12/05/2020 20:31   US Pelvis Complete  Result Date: 12/16/2020 CLINICAL DATA:  Pelvic pain EXAM: TRANSABDOMINAL ULTRASOUND OF PELVIS DOPPLER ULTRASOUND  OF OVARIES  TECHNIQUE: Transabdominal ultrasound examination of the pelvis was performed including evaluation of the uterus, ovaries, adnexal regions, and pelvic cul-de-sac. Color and duplex Doppler ultrasound was utilized to evaluate blood flow to the ovaries. COMPARISON:  CT 12/16/2020, MRI 12/05/2020, pelvic ultrasound 12/01/2020 FINDINGS: Uterus Measurements: 6.8 x 3.4 x 5 cm = volume: 60.5 mL. No fibroids or other mass visualized. Endometrium Poorly visible, unable to measure. Right ovary Measurements: 4.8 x 2.9 x 5.5 cm = volume: 38.6 mL. Solid right adnexal mass replaces most of right ovary. Left ovary Measurements: 10.5 x 8.7 x 9.2 cm = volume: 440 mL. Enlarged left ovary with heterogenous solid mass that replaces the left ovary. Previously the left ovary measured 8.3 x 7.7 x 8.5 cm on MRI. Pulsed Doppler evaluation demonstrates normal arterial and venous waveforms in both or adnexa, though note that both ovaries are essentially replaced by mass lesions. Other: No significant ascites. Foley catheter within the bladder which appears somewhat thick walled. IMPRESSION: 1. Very limited exam secondary to habitus and only trans abdominal technique 2. Bilateral solid adnexal masses essentially replacing the ovaries which thereby limits assessment of the ovaries. Left adnexal solid mass lesion has significantly increased in size; documented flow in the adnexa relates to flow within the solid adnexal masses. Gynecology follow-up recommended. Electronically Signed   By: Donavan Foil M.D.   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 EXAM: CT ABDOMEN AND PELVIS WITH CONTRAST TECHNIQUE: Multidetector CT imaging of the abdomen and pelvis was performed using the standard protocol following bolus administration of intravenous contrast. CONTRAST:  53mL OMNIPAQUE IOHEXOL 350  MG/ML SOLN COMPARISON:  Comparison is made with multiple prior studies which were performed recently, most recent comparison is an MRI of the pelvis of December 05, 2020. FINDINGS: Lower chest: Basilar atelectasis. Subcarinal region not well evaluated. No consolidation or effusion at the lung bases. Hepatobiliary: No focal, suspicious hepatic lesion. No pericholecystic stranding. Portal vein is patent. No biliary duct dilation. Pancreas: Pancreas with normal contours. No signs of adjacent inflammation. Spleen: Spleen normal size and contour. Adrenals/Urinary Tract: Well-circumscribed LEFT adrenal lesion, density value of 19 Hounsfield units on previous noncontrast imaging measuring 3.2 x 2.6 cm., relative washout is indeterminate at 30%. Normal RIGHT adrenal. Normal, symmetric enhancement of bilateral kidneys without focal renal lesion. Stomach/Bowel: Small hiatal hernia. No stranding adjacent to the stomach. No sign of small bowel obstruction or acute small bowel process. The appendix is normal. Stool in various parts of the colon without signs of obstruction or adjacent stranding. Vascular/Lymphatic: Retroperitoneal adenopathy (image 50/2) 14 mm lymph node previously 11 mm along the LEFT common iliac chain. LEFT pelvic sidewall lymph node (image 75/2) 14 mm short axis, previously 13 mm. Similar size of RIGHT pelvic sidewall/external iliac lymph nodes also with little change compared to the most recent comparison imaging. Reproductive: Foley catheter in situ. Uterus and cervix not well at assess nor are the masses within the area of the cervix and urethra seen on previous MRI. Increasing size of LEFT ovary with heterogeneous appearance now measuring 10 x 8.8 cm. When assessed on the study of October 20, 2020 the ovary for reference measured 3.7 x 2.5 cm. RIGHT ovary with area of enhancement shows enlargement as well but not as pronounced as on previous imaging with 2.8 cm area of enhancement in the RIGHT ovary  as  the largest discrete area which is similar to the recent MRI evaluation. As compared to the study of October 20, 2020 there is little change in the appearance of the RIGHT ovary aside from this area though there is an increasingly nodular appearance of the ovary in this location. Mass posterior to the LEFT ovary just above the uterus measuring 4.4 x 2.8 cm has enlarged from 2.5 cm. Other: Small volume ascites. Musculoskeletal: Heterogeneous pattern associated with the spine in terms of density raises the question of metastatic disease without discrete measurable focal lesion in the spine. For example a geographic area of sclerosis on image 101/5) in the anterior L3 vertebral body does raise the question of underlying lesion with numerous additional foci, essentially nearly all levels of the spine with some degree of heterogeneity. Another example of this finding is noted on the RIGHT at the T10 level. No acute bone finding or frankly destructive bone process. IMPRESSION: Rapid increase in size of the LEFT ovary with some adjacent ascites. The rapid enlargement is seen in the setting of generalized worsening of masses associated with the LEFT and RIGHT ovary but perhaps more pronounced than other areas in the pelvis. The possibility of torsion associated with an ovarian mass could be considered based on this finding. Would correlate with any worsening abdominal pain and with sonogram as warranted for further assessment. Enlarging masses elsewhere though not to the extent that is seen with the LEFT ovary. Masslike area of the cervix and potentially within a urethral diverticulum not as well seen as on the recent MRI evaluation. Small volume ascites. Well-circumscribed LEFT adrenal lesion measuring 3.2 x 2.6 cm, density value of 19 Hounsfield units on previous noncontrast imaging. Relative washout is indeterminate at 30%. This may represent an adrenal adenoma. Consider dedicated adrenal protocol CT or attention on  PET evaluation if performed. Heterogeneous pattern of subtle sclerosis and lucency in the spine on today's study raising the question of bony metastatic disease in the spine. Findings of increased ovarian size on the LEFT out of proportion other areas were called by telephone at the time of interpretation on 12/16/2020 at 5:44 pm to provider Theodis Blaze , who verbally acknowledged these results. Electronically Signed   By: Zetta Bills M.D.   On: 12/16/2020 17:46   [place] Art/Ven Flow Abd Pelv Doppler  Result Date: 12/16/2020 CLINICAL DATA:  Pelvic pain EXAM: TRANSABDOMINAL ULTRASOUND OF PELVIS DOPPLER ULTRASOUND OF OVARIES TECHNIQUE: Transabdominal ultrasound examination of the pelvis was performed including evaluation of the uterus, ovaries, adnexal regions, and pelvic cul-de-sac. Color and duplex Doppler ultrasound was utilized to evaluate blood flow to the ovaries. COMPARISON:  CT 12/16/2020, MRI 12/05/2020, pelvic ultrasound 12/01/2020 FINDINGS: Uterus Measurements: 6.8 x 3.4 x 5 cm = volume: 60.5 mL. No fibroids or other mass visualized. Endometrium Poorly visible, unable to measure. Right ovary Measurements: 4.8 x 2.9 x 5.5 cm = volume: 38.6 mL. Solid right adnexal mass replaces most of right ovary. Left ovary Measurements: 10.5 x 8.7 x 9.2 cm = volume: 440 mL. Enlarged left ovary with heterogenous solid mass that replaces the left ovary. Previously the left ovary measured 8.3 x 7.7 x 8.5 cm on MRI. Pulsed Doppler evaluation demonstrates normal arterial and venous waveforms in both or adnexa, though note that both ovaries are essentially replaced by mass lesions. Other: No significant ascites. Foley catheter within the bladder which appears somewhat thick walled. IMPRESSION: 1. Very limited exam secondary to habitus and only trans abdominal technique 2. Bilateral  solid adnexal masses essentially replacing the ovaries which thereby limits assessment of the ovaries. Left adnexal solid mass lesion has  significantly increased in size; documented flow in the adnexa relates to flow within the solid adnexal masses. Gynecology follow-up recommended. Electronically Signed   By: Donavan Foil M.D.   On: 12/16/2020 19:23   CT Renal Stone Study  Result Date: 12/01/2020 CLINICAL DATA:  Flank pain.  Evaluate for kidney stone. EXAM: CT ABDOMEN AND PELVIS WITHOUT CONTRAST TECHNIQUE: Multidetector CT imaging of the abdomen and pelvis was performed following the standard protocol without IV contrast. COMPARISON:  10/20/2020 FINDINGS: Lower chest: Subsegmental atelectasis versus scar is identified within both lung bases. Hepatobiliary: Mild hepatic steatosis. No focal liver abnormality identified. Gallbladder unremarkable. Pancreas: Unremarkable. No pancreatic ductal dilatation or surrounding inflammatory changes. Spleen: Normal in size without focal abnormality. Right adrenal gland appears normal. Left adrenal mass measures 3.3 x 2.5 cm. Unchanged from 06/02/2016 compatible with a benign adrenal adenoma. Adrenals/Urinary Tract: No kidney stones are identified bilaterally. No signs of hydronephrosis or hydroureter. Mild circumferential wall thickening of the urinary bladder is identified. Stomach/Bowel: Stomach appears normal. The appendix is visualized and appears normal. Moderate stool burden is identified within the colon up to the level of the rectum. No bowel wall thickening, inflammation, or distension. Vascular/Lymphatic: Aortic atherosclerosis.  No aneurysm. Signs of retroperitoneal and pelvic adenopathy, including: 1.1 cm left periaortic node, image 52/2. Increased from 1 cm previously. Left external iliac lymph node measures 1.1 cm, image 71/2. previously 7 mm. Right pelvic sidewall lymph node measures 1.1 cm. This is unchanged from previous exam. Within the right inguinal region there is a 1.3 cm lymph node, image 85/2. Previously 0.9 cm. Left inguinal region there is a 1.4 cm node, image 84/2. Formally 1 cm.  Reproductive: There is increased soft tissue fullness in the region of the cervix which appears unchanged from 10/20/2020. This measures 4.9 x 3.5 cm, image 76/2. Marked interval enlargement of left ovary. The left ovary has a masslike appearance measuring 8.4 x 6.6 cm, image 66/2. Formally this measured 4.5 x 2.7 cm. The right ovary measures 4.0 by 3.6 cm, image 66/2. Formally 4.6 by 2.7 cm. Other: No ascites or focal fluid collections. No signs of peritoneal nodularity. Small periumbilical hernia contains fat only. Supraumbilical fat containing hernia measures 3.3 cm, image 67/6. Infraumbilical hernia also contains fat only measuring 3.4 cm, image 70/6. Fat containing left inguinal hernia noted, small. Musculoskeletal: No acute or suspicious osseous findings. IMPRESSION: 1. No urinary tract calculi identified. 2. Marked interval enlargement of left ovary with masslike appearance. This is new finding compared with 10/20/2020. Given the short interval findings are favored to represent an acute process such as a hemorrhagic cyst. Tubo-ovarian abscess is less favored but not entirely excluded. Malignancy is also a differential consideration but considered less likely given the short interval from 10/20/2020. Initial workup with transabdominal/transvaginal pelvic sonogram is recommended. If the pelvic ultrasound is inconclusive then contrast enhanced pelvic MRI may be considered when the patient is clinically stable, is able to follow instructions, and remain motionless. Ideally this should be performed on a nonemergent basis. 3. Soft tissue fullness in the region of the cervix is again noted and appears similar to previous exam. Etiology is indeterminate. Advise correlation with cervical cancer screening. 4. Retroperitoneal and pelvic adenopathy identified. In the setting of infection this may reflect reactive adenopathy. Differential considerations include metastatic disease as well as lymphoproliferative disorders.  5. Multiple fat containing ventral abdominal wall  and left inguinal hernias. 6. Hepatic steatosis. 7. Stable left adrenal adenoma. 8. Aortic Atherosclerosis (ICD10-I70.0). Electronically Signed   By: Kerby Moors M.D.   On: 12/01/2020 06:43   US PELVIC COMPLETE W TRANSVAGINAL AND TORSION R/O  Result Date: 12/01/2020 CLINICAL DATA:  LLQ Pain EXAM: TRANSABDOMINAL AND TRANSVAGINAL ULTRASOUND OF PELVIS TECHNIQUE: Both transabdominal and transvaginal ultrasound examinations of the pelvis were performed. Transabdominal technique was performed for global imaging of the pelvis including uterus, ovaries, adnexal regions, and pelvic cul-de-sac. It was necessary to proceed with endovaginal exam following the transabdominal exam to visualize the endometrium and uterus. COMPARISON:  December 01, 2020, September eleventh 2022 FINDINGS: Uterus Uterus is only visualized transvaginally and is suboptimally assessed due to patient incomplete voiding and body habitus. Measurements: 8.8 x 3.6 x 5.0 cm = volume: 84 mL. Bulkiness of the cervix. Endometrium Poorly visualized due to distension of the bladder on transvaginal imaging. Right ovary Not visualized transabdominally or transvaginally. Left adnexal mass: Adnexal mass is labeled as uterus on transabdominal imaging. It measures 9.5 x 6.9 x 7.5 cm for a volume of 260 ML. It is predominately hypoechoic. There are internal anechoic spaces consistent with cystic density within the mass. This corresponds to the mass seen on CT. Doppler imaging was not performed of this mass, limiting evaluation for blood flow. Other findings No abnormal free fluid. IMPRESSION: 1. There is a hypoechoic LEFT adnexal mass which measures approximately 9.5 cm with central internal cystic density. Given newly conspicuous appearance since September 2022, this may reflect a hemorrhagic cyst or sequela of torsed ovary. Recommend GYN consult, and consideration of pelvis MRI w/o and w/ contrast if clinically  warranted. Note: This recommendation does not apply to premenarchal patients or to those with increased risk (genetic, family history, elevated tumor markers or other high-risk factors) of ovarian cancer. Reference: Radiology 2019 Nov; 293(2):359-371. 2. Bulkiness of the cervix, suboptimally assessed with current technique. Recommend correlation with physical exam. Electronically Signed   By: Valentino Saxon M.D.   On: 12/01/2020 10:22    ASSESSMENT AND PLAN: 1.  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  GI primary -12/05/2020 MRI of the pelvis-poorly marginated enhancing 3.7 x 2.8 x 3.0 cm mass centered at the urethra, solid avidly enhancing bilateral ovarian masses, poorly marginated enhancing 2.4 x 2.3 x 3.0 cm mass in the left uterine cervix, mild to moderate bilateral common iliac, bilateral external iliac, and bilateral inguinal lymphadenopathy, diffuse patchy confluent nodular replacement of the pelvic osseous structures. -12/13/2020 CEA 1864, CA125 29.2 -12/16/2020 CT abdomen/pelvis-rapidly increasing size of the left ovary with some adjacent ascites, enlarging masses elsewhere, masslike area of the cervix and potentially within a urethral diverticulum, well-circumscribed left adrenal lesion measuring 3.2 x 2.6 cm, heterogeneous pattern of subtle sclerosis and lucency in the spine. -12/16/2020 MRI of the lumbar spine-diffusely abnormal appearance of the bone marrow throughout the visualized lumbar spine and pelvis highly suspicious for diffuse osseous metastatic disease, retroperitoneal/iliac adenopathy with left larger than right adnexal masses. -12/17/2020 CT chest-no acute intrathoracic pathology -Upper endoscopy 12/22/2020-gastritis  -Colonoscopy 12/22/2020-polyps removed from the ascending and sigmoid colon, extrinsic compression of the sigmoid colon 2.  Abdominal pain secondary #1 3.  Constipation 4.  Normocytic anemia 5.  Leukocytosis 6.  Protein calorie  malnutrition 7.  Obstructive sleep apnea 8.  Diabetes mellitus 9.  Hypertension 10.  Possible pulmonary hypertension noted on CT chest  Erin [name] appears stable.  She underwent an EGD and colonoscopy this past weekend which  did not show obvious source of primary.  She also has extensive bone metastases.  It is possible that this could be an underlying bladder cancer.  Will request for path to be sent for [facility] and [facility].  She will need a PET scan which will be performed when she is discharged from the hospital.  Recommend continued adjustment of her pain medications per hospitalist and palliative care.  Recommend for her to continue to be more mobile and to work with therapy.  Recommendations: 1.  Send tissue for foundation 1 and [facility]. 2.  Will need outpatient PET scan. 3.  Adjust pain medication per palliative care and hospitalist. 4.  Increase mobility.  Future Appointments  Date Time Provider [place]  02/18/2021 10:45 AM [name], PA-C [facility]      LOS: 7 days   [name], [name], [name], [name] 12/23/20 Erin [name] was interviewed and examined.  She appears stable.  I reviewed the colonoscopy and endoscopy reports.  No primary tumor site was found.  We will follow-up pathology from the polyp and gastric biopsies.  If these biopsies reveal no invasive carcinoma the plan is to proceed with an outpatient PET scan.  We will submit tissue from the bladder/urethra biopsy for [facility] and [facility] tumor site testing.  She is now on 2 long-acting narcotics.  I would consider consolidating to a single long-acting narcotic and adding an oral narcotic for breakthrough pain.  Outpatient follow-up will be scheduled at the cancer center.  Plan to begin systemic therapy once we have a better idea of the primary tumor site.  I was present for greater than 50% of today's visit.  I performed medical decision making.

## 2020-12-23 NOTE — Progress Notes (Signed)
Inpatient Rehab Admissions Coordinator:   Attempted to reach pt by phone but no answer.  Will try and f/u tomorrow or Wednesday.  Note pain still requiring frequent IV diluadid for control, which we cannot support on CIR, will need to demonstrate adequate pain control for participation in therapy on PO regimen for CIR candidacy.    Shann Medal, PT, DPT Admissions Coordinator [phone] 12/23/20  5:06 PM

## 2020-12-23 NOTE — Progress Notes (Signed)
[facility] testing and [facility] testing ordered per Dr Benay Spice.

## 2020-12-23 NOTE — Progress Notes (Signed)
Progress Note    Erin [name]   WUX:[id number]  DOB: [birth year]  DOA: 12/16/2020     7 PCP: Pcp, No  Initial CC: abdominal pain  Hospital Course: Erin [name] is a 55 y.o. female with PMH OSA on CPAP, HTN, DMII, morbid obesity who presented with worsening abdominal pain and left thigh pain.   She has been undergoing workup for recently discovered cervical/urethral masses on CT in Sept 2022.  Further imaging showed ongoing interval enlargement of the left adnexal area.  There was also associated retroperitoneal and pelvic adenopathy. MRI pelvis also performed on 12/05/2020 showed poorly enhancing mass centered at the urethra, solid enhancing bilateral ovarian masses, poorly enhancing mass involving left uterine cervix, and significant adenopathy involving bilateral common iliac, bilateral external iliac, bilateral inguinal lymph nodes.  Also noted to have pelvic osseous lesions concerning for metastatic disease as well.  She was admitted for biopsy with urology and GYN oncology.  She was able to undergo cystoscopy with urethral and bladder biopsy on 12/17/2020.  She also underwent cervical biopsies following this procedure.  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  Interval History:  No events overnight.  Pain seems to be better controlled today.  Her niece arrived from [place] and I updated her in person bedside.  Otherwise patient continues to try and work with therapy and tentative plan is for pursuing CIR at this time.  Assessment & Plan: * Mass of urethra - s/p MRI pelvis on 12/05/20 -Biopsy taken during cystoscopy on 12/17/2020 - biopsy has returned on 11/11. Noted to have poorly differentiated adenocarcinoma with signet ring cell features on all biopsies  - foley has now been removed and she is voiding well - given negative EGD/CLN, there's a possibility of non-urothelial bladder cancer (further testing sent per  oncology); PET still pending  Adenocarcinoma ([place]) - Biopsies from anterior vaginal wall, bladder neck, and cervix are positive for poorly differentiated adenocarcinoma with signet ring cell features.  Despite suspicion for primary GI malignancy, she had no findings on EGD/CLN for primary source. Next step is further testing and awaiting gastric and polyp biopsies - palliative care consulted as well for Erin [name] and also assistance with pain control  - outpatient PET as able to be arranged; not sure if she goes to CIR if that will delay PET  Elevated CEA - patient being followed by urology and GYN onc initially. Now with biopsy results back, will consult GI and med onc - CEA 1864 and CA-125 is 29 on admission  Ovarian mass - s/p MRI pelvis on 12/05/20 showing several masses - now s/p cervical, uretheral, and bladder biopsy on 11/8  Gastritis and gastroduodenitis - 11/13 CLN: 5 polyps removed (1 in ascending, 4 in sigmoid); diverticulosis noted - 11/13 EGD: gastritis only; 6 weeks BID PPI then daily - no masses found to explain path results of adenocarcinoma with signet ring cells -Follow-up biopsies - may need to depend on PET scan to show other areas of focus   Abdominal pain - Presumed due to underlying multiple masses with significant lymphadenopathy and pelvic osseous lesions - Currently pain control is her biggest issue as well - will try d/c fentanyl patch today and slight increase in Oxycontin and allow for PRN oxyIR for breakthrough in efforts to anticipate discharge soon  Acute lower UTI - urine culture growing Strep viridans - blood cultures remain negative - bandemia slightly better 8%; continue Rocephin to complete 5 days total (end date  11/15)  Constipation - Underwent colon prep successfully on repeat prepping -Continue laxative regimen in setting of underlying opioid use  Malnutrition of moderate degree - Moderate malnutrition in context of acute illness/injury, morbid  obesity - Evaluated by dietitian, appreciate assistance - per RD: "Moderate Malnutrition related to acute illness as evidenced by percent weight loss, moderate muscle depletion, energy intake < 75% for > 7 days."  Hypokalemia - Replete as needed  OSA (obstructive sleep apnea) - Continue nightly CPAP  Diabetes mellitus ([place]) - continue SSI and CBG monitoring   Essential hypertension - Initially elevated on admission, has since improved.  Possibly from some uncontrolled pain initially; suspect her spikes are in setting of acute pain - Continue monitoring pressure and will treat further as needed - continue amlodipine and coreg     Old records reviewed in assessment of this patient  Antimicrobials:   DVT prophylaxis: Lovenox  Code Status:   Code Status: Full Code  Disposition Plan: Status is: Inpatient   Objective: Blood pressure 136/73, pulse 100, temperature 98.3 F (36.8 C), resp. rate 16, height 5\' 4"  (1.626 m), weight 127 kg, SpO2 95 %.  Examination:  Physical Exam Constitutional:      General: She is not in acute distress.    Appearance: She is well-developed. She is obese.  HENT:     Head: Normocephalic and atraumatic.     Mouth/Throat:     Mouth: Mucous membranes are moist.  Eyes:     Extraocular Movements: Extraocular movements intact.  Cardiovascular:     Rate and Rhythm: Normal rate and regular rhythm.  Pulmonary:     Effort: Pulmonary effort is normal.     Breath sounds: Normal breath sounds.  Abdominal:     Comments: Obese, slightly distended, hypoactive bowel sounds, nonspecific tenderness but worse in lower quadrants  Musculoskeletal:        General: Normal range of motion.     Cervical back: Normal range of motion and neck supple.  Skin:    General: Skin is warm and dry.  Neurological:     General: No focal deficit present.     Mental Status: She is alert.  Psychiatric:        Mood and Affect: Mood normal.        Behavior: Behavior normal.      Consultants:  Urology GYN oncology GI Medical oncology Palliative care  Procedures:  Diagnostic cystoscopy, bladder biopsy, urethral biopsy, 12/17/20 Cervical biopsies, 12/17/2020 EGD and colonoscopy 12/22/20  Data Reviewed: I have personally reviewed labs and imaging studies     LOS: 7 days  Time spent: Greater than 50% of the 35 minute visit was spent in counseling/coordination of care for the patient as laid out in the A&P.   Dwyane Dee, MD [facility] 12/23/2020, 2:28 PM

## 2020-12-23 NOTE — Progress Notes (Signed)
Inpatient Rehab Admissions Coordinator:   Per therapy recommendations,  patient was screened for CIR candidacy by Clemens Catholic, MS, CCC-SLP . At this time, Pt. Appears to be a a potential candidate for CIR. I will place  order for rehab consult per protocol for full assessment. Note that pt. Will need to be able to reach mod I goals if she does not have 24/7 support at discharge.  Please contact me any with questions.  Clemens Catholic, [place], [place] Admissions Coordinator  [phone] ([place]) [phone] (office)

## 2020-12-23 NOTE — Progress Notes (Signed)
1 Day Post-Op Subjective: C/o abdominal pain, controlled with narcotics. No nausea or emesis. Afebrile.  Objective: Vital signs in last 24 hours: Temp:  [97.8 F (36.6 C)-99.9 F (37.7 C)] 98.6 F (37 C) (11/14 0550) Pulse Rate:  [90-95] 90 (11/14 0550) Resp:  [18] 18 (11/14 0550) BP: (133-154)/(81-101) 133/85 (11/14 0550) SpO2:  [93 %-99 %] 95 % (11/14 0550)  Intake/Output from previous day: 11/13 0701 - 11/14 0700 In: 2030 [P.O.:1230; I.V.:700; IV Piggyback:100] Out: 850 [Urine:850] Intake/Output this shift: Total I/O In: 120 [P.O.:120] Out: 100 [Urine:100]  Physical Exam:  General: Alert and oriented CV: RRR Lungs: Clear Abdomen: Soft, ND, ATTP Ext: NT, No erythema  Lab Results: Recent Labs    12/21/20 0522 12/22/20 0433 12/23/20 0410  HGB 9.2* 8.5* 8.1*  HCT 27.9* 26.5* 25.7*   BMET Recent Labs    12/22/20 0433 12/23/20 0410  NA 139 138  K 3.6 3.8  CL 104 104  CO2 27 26  GLUCOSE 137* 119*  BUN 24* 21*  CREATININE 0.77 0.80  CALCIUM 9.3 9.4     Studies/Results: No results found.  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 Claudia Desanctis with adenocarcinoma as above. Irregularity at bladder neck and trigone obscuring left UO.  Cervical mass Bilateral ovarian masses Extensive bone metastasis throughout lumbar spine Extensive retroperitoneal/iliac adenopathy  -Reviewed notes with no lesions identified on EGD and colonoscopy this weekend. -Possible that this could be a rare, aggressive non-urothelial adenocarcinoma of the bladder  -Agree with further pathology review per med/onc -Discussed with patient that unfortunately, there is no role for surgical consolidative therapy at this point given widespread metastasis. Choice of chemotherapy to be initiated outpatient per med/onc once pathology has been  reviewed. -She reports voiding without difficulty -Will arrange for outpatient followup   LOS: 7 days   Matt R. Galen Malkowski MD 12/23/2020, 12:54 PM [facility]  Pager: [phone]

## 2020-12-23 NOTE — Progress Notes (Signed)
Physical Therapy Treatment Patient Details Name: Erin [name] MRN: [id number] DOB: 1966/01/23 Today's Date: 12/23/2020   History of Present Illness Pt is 55yo female who presented on 11/7 with increasing abdominal pain and L thigh pain. Has recent hx of cervical/urethral masses found on CT 9/22, as well as urethral mass, B ovarian masses, L uterine cervical mass, and pelvis osseous lesions concerning for metastatic disease. Admitted for further w/u and biopsy. PMH HLD, HTN, OSA, DOE, DM, shoulder arthroscopy with rotator cuff repair and subacromial decompression    PT Comments    Pt making gradual progress today.  She was able to ambulate in hall but limited distances, chair follow, and min A at slow speed.  Required mod A for transfers.  Remains largely limited by pain.  Pt lives alone and does not have 24 hr support - updated recommendation to CIR, pt in agreement.     Recommendations for follow up therapy are one component of a multi-disciplinary discharge planning process, led by the attending physician.  Recommendations may be updated based on patient status, additional functional criteria and insurance authorization.  Follow Up Recommendations  Acute inpatient rehab (3hours/day)     Assistance Recommended at Discharge Frequent or constant Supervision/Assistance  Equipment Recommendations  Rolling walker (2 wheels);Hospital bed;BSC/3in1;Wheelchair (measurements PT);Wheelchair cushion (measurements PT) (further assessment post acute; would benefit from bariatric w/c,RW, bsc for width)    Recommendations for Other Services Rehab consult     Precautions / Restrictions Precautions Precautions: Fall Restrictions Weight Bearing Restrictions: No     Mobility  Bed Mobility Overal bed mobility: Needs Assistance Bed Mobility: Supine to Sit     Supine to sit: Mod assist     General bed mobility comments: cues for sequencing, keeping legs close together when moving to side  of bed for decreased pelvis pain, then mod A to lift trunk and scoot forward    Transfers Overall transfer level: Needs assistance Equipment used: Rolling walker (2 wheels) Transfers: Sit to/from Stand Sit to Stand: Mod assist           General transfer comment: Required mod A to stand; did have tech present for safety; cues to not hold breath    Ambulation/Gait Ambulation/Gait assistance: Min assist;+2 safety/equipment Gait Distance (Feet): 30 Feet Assistive device: Rolling walker (2 wheels) Gait Pattern/deviations: Step-to pattern;Decreased stride length;Shuffle Gait velocity: decreased     General Gait Details: Required chair follow for safety and rest due to pain; ambulated 30' at slow speed with shuffle gait and cues for posture as able   Stairs             Wheelchair Mobility    Modified Rankin (Stroke Patients Only)       Balance Overall balance assessment: Needs assistance Sitting-balance support: Feet supported Sitting balance-Leahy Scale: Good Sitting balance - Comments: Sitting EOB 10 mins prior to ambulation due to pain and performed ADLs (washing back)   Standing balance support: Bilateral upper extremity supported;During functional activity Standing balance-Leahy Scale: Poor Standing balance comment: reliant on BUE support                            Cognition Arousal/Alertness: Awake/alert Behavior During Therapy: WFL for tasks assessed/performed Overall Cognitive Status: Within Functional Limits for tasks assessed  Exercises General Exercises - Lower Extremity Ankle Circles/Pumps: AROM;Both;10 reps;Seated Long Arc Quad: AROM;Both;10 reps;Seated    General Comments General comments (skin integrity, edema, etc.): Pt was on 2 L O2 with sats 97%; tried RA and maintained 96% rest and 91% activity; left on RA (pt reports cpap use at night, but no O2 during day at home)       Pertinent Vitals/Pain Pain Assessment: 0-10 Pain Score: 7  Pain Location: pelvis area Pain Descriptors / Indicators: Aching;Discomfort Pain Intervention(s): Limited activity within patient's tolerance;Monitored during session;Repositioned    Home Living                          Prior Function            PT Goals (current goals can now be found in the care plan section) Progress towards PT goals: Progressing toward goals    Frequency    Min 3X/week      PT Plan Discharge plan needs to be updated;Frequency needs to be updated    Co-evaluation              AM-PAC PT "6 Clicks" Mobility   Outcome Measure  Help needed turning from your back to your side while in a flat bed without using bedrails?: A Lot Help needed moving from lying on your back to sitting on the side of a flat bed without using bedrails?: A Lot Help needed moving to and from a bed to a chair (including a wheelchair)?: A Lot Help needed standing up from a chair using your arms (e.g., wheelchair or bedside chair)?: A Lot Help needed to walk in hospital room?: Total Help needed climbing 3-5 steps with a railing? : Total 6 Click Score: 10    End of Session Equipment Utilized During Treatment: Gait belt Activity Tolerance: Patient limited by pain Patient left: with chair alarm set;in chair;with call bell/phone within reach (pillows behind back and under knees for comfort) Nurse Communication: Mobility status (notified techs pt said 1.5 hr was too long in chair and they plan to check back sooner) PT Visit Diagnosis: Unsteadiness on feet (R26.81);Difficulty in walking, not elsewhere classified (R26.2);Pain;Muscle weakness (generalized) (M62.81)     Time: 3295-1884 PT Time Calculation (min) (ACUTE ONLY): 28 min  Charges:  $Gait Training: 8-22 mins $Therapeutic Activity: 8-22 mins                     Abran Richard, PT Acute Rehab Services Pager [phone] [facility]  [place] 12/23/2020, 11:39 AM

## 2020-12-24 DIAGNOSIS — K2970 Gastritis, unspecified, without bleeding: Secondary | ICD-10-CM

## 2020-12-24 DIAGNOSIS — C799 Secondary malignant neoplasm of unspecified site: Secondary | ICD-10-CM

## 2020-12-24 DIAGNOSIS — K2990 Gastroduodenitis, unspecified, without bleeding: Secondary | ICD-10-CM

## 2020-12-24 LAB — CBC WITH DIFFERENTIAL/PLATELET
Abs Immature Granulocytes: 1.07 10*3/uL — ABNORMAL HIGH (ref 0.00–0.07)
Basophils Absolute: 0 10*3/uL (ref 0.0–0.1)
Basophils Relative: 0 %
Eosinophils Absolute: 0.1 10*3/uL (ref 0.0–0.5)
Eosinophils Relative: 1 %
HCT: 25.4 % — ABNORMAL LOW (ref 36.0–46.0)
Hemoglobin: 8.1 g/dL — ABNORMAL LOW (ref 12.0–15.0)
Immature Granulocytes: 8 %
Lymphocytes Relative: 13 %
Lymphs Abs: 1.7 10*3/uL (ref 0.7–4.0)
MCH: 29.9 pg (ref 26.0–34.0)
MCHC: 31.9 g/dL (ref 30.0–36.0)
MCV: 93.7 fL (ref 80.0–100.0)
Monocytes Absolute: 0.9 10*3/uL (ref 0.1–1.0)
Monocytes Relative: 7 %
Neutro Abs: 9.4 10*3/uL — ABNORMAL HIGH (ref 1.7–7.7)
Neutrophils Relative %: 71 %
Platelets: 284 10*3/uL (ref 150–400)
RBC: 2.71 MIL/uL — ABNORMAL LOW (ref 3.87–5.11)
RDW: 14.5 % (ref 11.5–15.5)
WBC: 13.2 10*3/uL — ABNORMAL HIGH (ref 4.0–10.5)
nRBC: 1.7 % — ABNORMAL HIGH (ref 0.0–0.2)

## 2020-12-24 LAB — BASIC METABOLIC PANEL
Anion gap: 9 (ref 5–15)
BUN: 23 mg/dL — ABNORMAL HIGH (ref 6–20)
CO2: 24 mmol/L (ref 22–32)
Calcium: 9.5 mg/dL (ref 8.9–10.3)
Chloride: 107 mmol/L (ref 98–111)
Creatinine, Ser: 0.81 mg/dL (ref 0.44–1.00)
GFR, Estimated: >60 mL/min (ref >60)
Glucose, Bld: 132 mg/dL — ABNORMAL HIGH (ref 70–99)
Potassium: 4.1 mmol/L (ref 3.5–5.1)
Sodium: 140 mmol/L (ref 135–145)

## 2020-12-24 LAB — GLUCOSE, CAPILLARY
Glucose-Capillary: 124 mg/dL — ABNORMAL HIGH (ref 70–99)
Glucose-Capillary: 125 mg/dL — ABNORMAL HIGH (ref 70–99)
Glucose-Capillary: 126 mg/dL — ABNORMAL HIGH (ref 70–99)
Glucose-Capillary: 140 mg/dL — ABNORMAL HIGH (ref 70–99)

## 2020-12-24 LAB — MAGNESIUM: Magnesium: 2.2 mg/dL (ref 1.7–2.4)

## 2020-12-24 LAB — SURGICAL PATHOLOGY

## 2020-12-24 MED ORDER — PANTOPRAZOLE SODIUM 40 MG PO TBEC
40.0000 mg | DELAYED_RELEASE_TABLET | Freq: Two times a day (BID) | ORAL | Status: AC
  Administered 2020-12-24 – 2021-01-13 (×42): 40 mg via ORAL
  Filled 2020-12-24 (×40): qty 1

## 2020-12-24 MED ORDER — POLYETHYLENE GLYCOL 3350 17 G PO PACK
17.0000 g | PACK | Freq: Every day | ORAL | Status: DC
  Administered 2020-12-25 – 2021-01-08 (×11): 17 g via ORAL
  Filled 2020-12-24 (×12): qty 1

## 2020-12-24 MED ORDER — ENSURE ENLIVE PO LIQD
237.0000 mL | Freq: Two times a day (BID) | ORAL | Status: DC
  Administered 2020-12-25 – 2020-12-27 (×5): 237 mL via ORAL

## 2020-12-24 NOTE — Progress Notes (Addendum)
GYN Oncology Progress Note  Erin [name] is a 55 year old female initially seen by Dr. Jeral Pinch in consultation at the request of Dr. Radene Knee for evaluation of multiple abnormal findings on recent MRI raising the concern for metastatic cancer, possible GYN origin. From the visit, she was scheduled for a PET scan and was scheduled to have a procedure with Urology for cystoscopy and evaluation of a urethral mass. She presented to the ER on 12/16/20 for severe LLQ pain with bilateral lower extremity pain. CT imaging showed rapid increase in size of left ovary with ascites, possible bony metastatic disease to the spine, enlarging masses elsewhere. A pelvic ultrasound was also performed along with a MR of the spine revealing abnormalities of the spine suspicious for diffuse osseous metastatic disease along with retroperitoneal/iliac adenopathy. On 12/17/2020, she underwent an examination under anesthesia, diagnostic cystoscopy, bladder and urethral biopsy. The biopsies resulted as poorly differentiated adenocarcinoma with signet ring cell features, most consistent with lower GI tract primary. The patient underwent a colonoscopy and upper endoscopy on 12/22/20 with GI. Findings included the examined esophagus and duodenum as normal, localized inflammation in the stomach, one 12 mm polyp in the ascending colon along with four 3 to 5 mm polyps in the sigmoid colon all sent for path.   This am, she continue to report pain in the LLQ abdomen. Her pain has improved with oxycontin and prn medications. She is tolerating a regular diet with no nausea or emesis reported. Denies chest pain, dyspnea. She states she had 5-6 loose stools overnight. She walked in the halls with PT recently and states she was encouraged by this. No needs voiced. States she needs prayers and positive support.   Exam: Alert, oriented, laying in bed, in no acute distress. Lungs clear. Heart regular in rate and rhythm. Abdomen obese, active  bowel sounds. No lower extremity pitting edema appreciated, mild around bilateral ankles.   Continue plan of care per the Hospitalist Team. Biopsy specimen from 12/17/20 procedure sent for additional testing given negative EGD/colonoscopy. GI biopsies pending. Per notes, consideration of CIR at discharge. Discussed the need to hold laxatives with patient's RN if having loose stools. Comments also added to the medications. Discontinuation of meds if needed deferred to primary team.  Addendum: Gastric biopsies from EGD show adenocarcinoma, consistent with biopsies taken last week. Additionally, I asked pathology to add p16 testing to prior biopsies. This was negative, further supporting that this is not a cervix cancer (primary signet ring adenocarcinoma of the cervix is exceptionally rare - most of the small number of reported cases have been p16 positive). 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 Dorian Pod MD

## 2020-12-24 NOTE — Progress Notes (Signed)
Progress Note    Erin [name]   HWE:[id number]  DOB: [birth year]  DOA: 12/16/2020     8 PCP: Erin [name]  Initial CC: abdominal pain  Hospital Course: Erin [name] is a 55 y.o. female with PMH OSA on CPAP, HTN, DMII, morbid obesity who presented with worsening abdominal pain and left thigh pain.   She has been undergoing workup for recently discovered cervical/urethral masses on CT in Sept 2022.  Further imaging showed ongoing interval enlargement of the left adnexal area.  There was also associated retroperitoneal and pelvic adenopathy. MRI pelvis also performed on 12/05/2020 showed poorly enhancing mass centered at the urethra, solid enhancing bilateral ovarian masses, poorly enhancing mass involving left uterine cervix, and significant adenopathy involving bilateral common iliac, bilateral external iliac, bilateral inguinal lymph nodes.  Also noted to have pelvic osseous lesions concerning for metastatic disease as well.  She was admitted for biopsy with urology and GYN oncology.  She was able to undergo cystoscopy with urethral and bladder biopsy on 12/17/2020.  She also underwent cervical biopsies following this procedure.  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  Interval History:  [name] events overnight.  Resting in bed comfortable.  Dilaudid was discontinued yesterday.  Still giving thought about having a port placed; wishes to discuss further with her family.  Assessment & Plan: * Mass of urethra - s/p MRI pelvis on 12/05/20 -Biopsy taken during cystoscopy on 12/17/2020 - biopsy has returned on 11/11. 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  Despite suspicion for primary GI malignancy, she had [name] findings on EGD/CLN for primary source. Next step is further testing and awaiting gastric and polyp biopsies - palliative care consulted as well for [place] and also assistance with pain control  - outpatient PET as able to be arranged; likely has to be done after discharge from CIR (if she goes to CIR)  Elevated CEA - patient being followed by urology and GYN onc initially. Now with biopsy results back, GI and med onc on board - CEA 1864 and CA-125 is 29 on admission  Ovarian mass - s/p MRI pelvis on 12/05/20 showing several masses - now s/p cervical, uretheral, and bladder biopsy on 11/8  Gastritis and gastroduodenitis - 11/13 CLN: 5 polyps removed (1 in ascending, 4 in sigmoid); diverticulosis noted - 11/13 EGD: gastritis only; 6 weeks BID PPI then daily - [name] masses found to explain path results of adenocarcinoma with signet ring cells -Follow-up biopsies from scopes - may need to depend on PET scan to show other areas of focus   Abdominal pain - Presumed due to underlying multiple masses with significant lymphadenopathy and pelvic osseous lesions - Currently pain control is her biggest issue as well - tolerating new regimen: oxycontin and OxyIR, [name] further dilaudid; will need to still possibly adjust oxycontin once fentanyl patch removed   Acute lower UTI-resolved as of 12/24/2020 - urine culture growing Strep viridans - blood cultures remain negative - bandemia slightly better 8%; continue Rocephin to complete 5 days total (end date 11/15)  Constipation-resolved as of 12/24/2020 - Underwent colon prep successfully  on repeat prepping -Continue laxative regimen in setting of underlying opioid use  Malnutrition of moderate degree - Moderate malnutrition in context of acute illness/injury,  morbid obesity - Evaluated by dietitian, appreciate assistance - per RD: "Moderate Malnutrition related to acute illness as evidenced by percent weight loss, moderate muscle depletion, energy intake < 75% for > 7 days."  Hypokalemia - Replete as needed  OSA (obstructive sleep apnea) - Continue nightly CPAP  Diabetes mellitus ([place]) - continue SSI and CBG monitoring   Essential hypertension - Initially elevated on admission, has since improved.  Possibly from some uncontrolled pain initially; suspect her spikes are in setting of acute pain - Continue monitoring pressure and will treat further as needed - continue amlodipine and coreg    Old records reviewed in assessment of this patient  Antimicrobials:   DVT prophylaxis: Lovenox  Code Status:   Code Status: Full Code  Disposition Plan: Status is: Inpatient   Objective: Blood pressure (!) 150/82, pulse 94, temperature 99 F (37.2 C), temperature source Oral, resp. rate 16, height 5\' 4"  (1.626 m), weight 127 kg, SpO2 95 %.  Examination:  Physical Exam Constitutional:      General: She is not in acute distress.    Appearance: She is well-developed. She is obese.  HENT:     Head: Normocephalic and atraumatic.     Mouth/Throat:     Mouth: Mucous membranes are moist.  Eyes:     Extraocular Movements: Extraocular movements intact.  Cardiovascular:     Rate and Rhythm: Normal rate and regular rhythm.  Pulmonary:     Effort: Pulmonary effort is normal.     Breath sounds: Normal breath sounds.  Abdominal:     Comments: Obese, less distended, slightly hypoactive bowel sounds, nonspecific tenderness but worse in lower quadrants  Musculoskeletal:        General: Normal range of motion.     Cervical back: Normal range of motion and neck supple.  Skin:    General: Skin is warm and dry.  Neurological:     General: [name] focal deficit present.     Mental Status: She is alert.  Psychiatric:        Mood and Affect: Mood normal.         Behavior: Behavior normal.     Consultants:  Urology GYN oncology GI Medical oncology Palliative care  Procedures:  Diagnostic cystoscopy, bladder biopsy, urethral biopsy, 12/17/20 Cervical biopsies, 12/17/2020 EGD and colonoscopy 12/22/20  Data Reviewed: I have personally reviewed labs and imaging studies     LOS: 8 days  Time spent: Greater than 50% of the 35 minute visit was spent in counseling/coordination of care for the patient as laid out in the A&P.   Dwyane Dee, MD [facility] 12/24/2020, 2:23 PM

## 2020-12-24 NOTE — Progress Notes (Addendum)
Nutrition Follow-up  DOCUMENTATION CODES:   Non-severe (moderate) malnutrition in context of acute illness/injury, Morbid obesity  INTERVENTION:   -Ensure Enlive po BID, each supplement provides 350 kcal and 20 grams of protein  -Multivitamin with minerals daily  NUTRITION DIAGNOSIS:   Moderate Malnutrition related to acute illness as evidenced by percent weight loss, moderate muscle depletion, energy intake < 75% for > 7 days.  Ongoing.  GOAL:   Patient will meet greater than or equal to 90% of their needs  Not meeting.  MONITOR:   PO intake, Supplement acceptance, Labs, Weight trends, I & O's  ASSESSMENT:   55 y.o. female with PMH OSA on CPAP, HTN, DMII, morbid obesity who presented with worsening abdominal pain and left thigh pain.She has been undergoing workup for recently discovered cervical/urethral masses on CT in Sept 2022.  Patient currently consuming 0% of meals. Requesting feeding assistance from staff. If not consuming any meals, will need higher calorie supplements, will switch to Ensure Enlive.   Admission weight: 279 lbs. No new weights for admission.  Medications: Multivitamin with minerals daily, Senokot  Labs reviewed:  CBGs: 124-173  Diet Order:   Diet Order             Diet Carb Modified Fluid consistency: Thin; Room service appropriate? Yes  Diet effective now                   EDUCATION NEEDS:   No education needs have been identified at this time  Skin:  Skin Assessment: Reviewed RN Assessment  Last BM:  11/15 -type 6  Height:   Ht Readings from Last 1 Encounters:  12/22/20 5\' 4"  (1.626 m)    Weight:   Wt Readings from Last 1 Encounters:  12/22/20 127 kg    BMI:  Body mass index is 48.06 kg/m.  Estimated Nutritional Needs:   Kcal:  2150-2350  Protein:  85-100g  Fluid:  2.1L/day  Clayton Bibles, MS, RD, LDN Inpatient Clinical Dietitian Contact information available via Amion

## 2020-12-25 ENCOUNTER — Encounter: Payer: Self-pay | Admitting: *Deleted

## 2020-12-25 LAB — CBC WITH DIFFERENTIAL/PLATELET
Abs Immature Granulocytes: 1.01 10*3/uL — ABNORMAL HIGH (ref 0.00–0.07)
Basophils Absolute: 0 10*3/uL (ref 0.0–0.1)
Basophils Relative: 0 %
Eosinophils Absolute: 0.1 10*3/uL (ref 0.0–0.5)
Eosinophils Relative: 1 %
HCT: 26.1 % — ABNORMAL LOW (ref 36.0–46.0)
Hemoglobin: 8.3 g/dL — ABNORMAL LOW (ref 12.0–15.0)
Immature Granulocytes: 8 %
Lymphocytes Relative: 11 %
Lymphs Abs: 1.5 10*3/uL (ref 0.7–4.0)
MCH: 30.3 pg (ref 26.0–34.0)
MCHC: 31.8 g/dL (ref 30.0–36.0)
MCV: 95.3 fL (ref 80.0–100.0)
Monocytes Absolute: 0.8 10*3/uL (ref 0.1–1.0)
Monocytes Relative: 6 %
Neutro Abs: 9.8 10*3/uL — ABNORMAL HIGH (ref 1.7–7.7)
Neutrophils Relative %: 74 %
Platelets: 258 10*3/uL (ref 150–400)
RBC: 2.74 MIL/uL — ABNORMAL LOW (ref 3.87–5.11)
RDW: 14.6 % (ref 11.5–15.5)
WBC: 13.1 10*3/uL — ABNORMAL HIGH (ref 4.0–10.5)
nRBC: 1.4 % — ABNORMAL HIGH (ref 0.0–0.2)

## 2020-12-25 LAB — BASIC METABOLIC PANEL
Anion gap: 9 (ref 5–15)
BUN: 19 mg/dL (ref 6–20)
CO2: 22 mmol/L (ref 22–32)
Calcium: 8.9 mg/dL (ref 8.9–10.3)
Chloride: 101 mmol/L (ref 98–111)
Creatinine, Ser: 0.77 mg/dL (ref 0.44–1.00)
GFR, Estimated: >60 mL/min (ref >60)
Glucose, Bld: 130 mg/dL — ABNORMAL HIGH (ref 70–99)
Potassium: 3.8 mmol/L (ref 3.5–5.1)
Sodium: 132 mmol/L — ABNORMAL LOW (ref 135–145)

## 2020-12-25 LAB — GLUCOSE, CAPILLARY
Glucose-Capillary: 110 mg/dL — ABNORMAL HIGH (ref 70–99)
Glucose-Capillary: 122 mg/dL — ABNORMAL HIGH (ref 70–99)
Glucose-Capillary: 113 mg/dL — ABNORMAL HIGH (ref 70–99)
Glucose-Capillary: 104 mg/dL — ABNORMAL HIGH (ref 70–99)

## 2020-12-25 LAB — MAGNESIUM: Magnesium: 2.1 mg/dL (ref 1.7–2.4)

## 2020-12-25 MED ORDER — ACETAMINOPHEN 325 MG PO TABS
650.0000 mg | ORAL_TABLET | Freq: Four times a day (QID) | ORAL | Status: DC | PRN
  Administered 2020-12-28 – 2021-01-13 (×10): 650 mg via ORAL
  Filled 2020-12-25 (×10): qty 2

## 2020-12-25 MED ORDER — ACETAMINOPHEN 500 MG PO TABS
1000.0000 mg | ORAL_TABLET | Freq: Three times a day (TID) | ORAL | Status: AC
  Administered 2020-12-25 – 2020-12-28 (×9): 1000 mg via ORAL
  Filled 2020-12-25 (×9): qty 2

## 2020-12-25 NOTE — Assessment & Plan Note (Signed)
CPAP.  

## 2020-12-25 NOTE — Progress Notes (Signed)
Inpatient Rehab Admissions Coordinator:   Spoke to patient over the phone to discuss CIR goals/expectations.  Reviewed estimated length of stay to be about a week, depending on progress, and goals of independent to modified independent.  Pt reports all family is "scattered" and not local to provide significant support at discharge from CIR.  I reviewed cost of rehab and what that looks like with medicaid pending.  She is agreeable to pursuing CIR and I will follow for medical readiness and bed availability.   Shann Medal, PT, DPT Admissions Coordinator [phone] 12/25/20  4:55 PM

## 2020-12-25 NOTE — Progress Notes (Signed)
Physical Therapy Treatment Patient Details Name: Erin [name] MRN: [id number] DOB: 10-30-1965 Today's Date: 12/25/2020   History of Present Illness Pt is 55yo female who presented on 11/7 with increasing abdominal pain and L thigh pain. Has recent hx of cervical/urethral masses found on CT 9/22, as well as urethral mass, B ovarian masses, L uterine cervical mass, and pelvis osseous lesions concerning for metastatic disease. Admitted for further w/u and biopsy. PMH HLD, HTN, OSA, DOE, DM, shoulder arthroscopy with rotator cuff repair and subacromial decompression    PT Comments    General Comments: AxO x 3 very pleasant lady and willing to try. General bed mobility comments: pt required HOB elevated and heavy use of rail to transition to EOB.  Greatest difficulty was self scooting due to pain.General transfer comment: pt was able to rise from elevated bed with Min Assist but needed Mod Assist from lower toilet level.  Poor forward flex posture due to ABD pain.  Required increased time to complete task.  Very slow moving but very willing to try. General Gait Details: limited amb distance this session due to max c/o weakness and increased ABD pain.  Pt also became incont urine upond standing.  Assisted to bathroom and assisted with a wash up. Assisted back to bed.     Recommendations for follow up therapy are one component of a multi-disciplinary discharge planning process, led by the attending physician.  Recommendations may be updated based on patient status, additional functional criteria and insurance authorization.  Follow Up Recommendations  Acute inpatient rehab (3hours/day)     Assistance Recommended at Discharge Frequent or constant Supervision/Assistance  Equipment Recommendations       Recommendations for Other Services Rehab consult     Precautions / Restrictions Precautions Precautions: Fall Precaution Comments: METS pelvis and spine     Mobility  Bed  Mobility Overal bed mobility: Needs Assistance Bed Mobility: Supine to Sit     Supine to sit: Min assist     General bed mobility comments: pt required HOB elevated and heavy use of rail to transition to EOB.  Greatest difficulty was self scooting due to pain.    Transfers Overall transfer level: Needs assistance Equipment used: Rolling walker (2 wheels) Transfers: Sit to/from Stand Sit to Stand: Min assist;Mod assist           General transfer comment: pt was able to rise from elevated bed with Min Assist but needed Mod Assist from lower toilet level.  Poor forward flex posture due to ABD pain.  Required increased time to complete task.  Very slow moving but very willing to try.    Ambulation/Gait Ambulation/Gait assistance: Min assist Gait Distance (Feet): 22 Feet (11 feet x 2 to and from bathroom) Assistive device: Rolling walker (2 wheels) Gait Pattern/deviations: Step-to pattern;Decreased stride length;Shuffle;Trunk flexed Gait velocity: decreased     General Gait Details: limited amb distance this session due to max c/o weakness and increased ABD pain.  Pt also became incont urine upond standing.  Assisted to bathroom and assisted with a wash up.   Stairs             Wheelchair Mobility    Modified Rankin (Stroke Patients Only)       Balance  Cognition Arousal/Alertness: Awake/alert Behavior During Therapy: WFL for tasks assessed/performed Overall Cognitive Status: Within Functional Limits for tasks assessed                                 General Comments: AxO x 3 very pleasant lady and willing to try        Exercises      General Comments        Pertinent Vitals/Pain Pain Assessment: Faces Faces Pain Scale: Hurts little more Pain Location: pelvis area/ABD "deep" Pain Descriptors / Indicators: Aching;Discomfort Pain Intervention(s): Monitored during  session;Premedicated before session;Repositioned;Patient requesting pain meds-RN notified    Home Living                          Prior Function            PT Goals (current goals can now be found in the care plan section) Progress towards PT goals: Progressing toward goals    Frequency    Min 3X/week      PT Plan Discharge plan needs to be updated;Frequency needs to be updated    Co-evaluation              AM-PAC PT "6 Clicks" Mobility   Outcome Measure  Help needed turning from your back to your side while in a flat bed without using bedrails?: A Lot Help needed moving from lying on your back to sitting on the side of a flat bed without using bedrails?: A Lot Help needed moving to and from a bed to a chair (including a wheelchair)?: A Lot Help needed standing up from a chair using your arms (e.g., wheelchair or bedside chair)?: A Lot Help needed to walk in hospital room?: A Lot Help needed climbing 3-5 steps with a railing? : Total 6 Click Score: 11    End of Session Equipment Utilized During Treatment: Gait belt Activity Tolerance: Patient limited by pain;Patient limited by fatigue Patient left: in bed;with call bell/phone within reach Nurse Communication: Mobility status PT Visit Diagnosis: Unsteadiness on feet (R26.81);Difficulty in walking, not elsewhere classified (R26.2);Pain;Muscle weakness (generalized) (M62.81)     Time: 8341-9622 PT Time Calculation (min) (ACUTE ONLY): 46 min  Charges:  $Gait Training: 8-22 mins $Therapeutic Activity: 23-37 mins                     {Kamani Lewter  PTA Acute  Rehabilitation Services Pager      [phone] Office      [phone]

## 2020-12-25 NOTE — Progress Notes (Signed)
PATIENT NAVIGATOR PROGRESS NOTE  Name: Erin [name] Date: 12/25/2020 MRN: [id number]  DOB: [birth year]   Reason for visit:  Add on molecular testing  Comments:  Added on on Her2 and PDL 1    Time spent counseling/coordinating care: < 15 minutes

## 2020-12-25 NOTE — Assessment & Plan Note (Signed)
resolved 

## 2020-12-25 NOTE — Plan of Care (Signed)

## 2020-12-25 NOTE — Assessment & Plan Note (Signed)
-   Moderate malnutrition in context of acute illness/injury, morbid obesity - Evaluated by dietitian, appreciate assistance - per RD: "Moderate Malnutrition related to acute illness as evidenced by percent weight loss, moderate muscle depletion, energy intake < 75% for > 7 days."

## 2020-12-25 NOTE — Consult Note (Signed)
Chief Complaint: Patient was seen in consultation today for port a cath placement Chief Complaint  Patient presents with   Leg Pain   Abdominal Pain     Referring Physician(s): Sherrill,B  Supervising Physician: Ruthann Cancer  Patient Status: [facility] - In-pt  History of Present Illness: Erin [name] is a 55 y.o. 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  Request now received from oncology for Port-A-Cath placement to assist with treatment.  Past Medical History:  Diagnosis Date   Anxiety    Constipation    Depression    GERD (gastroesophageal reflux disease)    Pt states she no longer needs meds   Hyperlipidemia    Hypertension    Menorrhagia    OSA (obstructive sleep apnea) 11/25/2017   OSA on CPAP    Other fatigue    Shortness of breath on exertion    Type 2 diabetes mellitus ([place])    Vitamin D deficiency    Wears glasses     Past Surgical History:  Procedure Laterality Date   BIOPSY  12/22/2020   Procedure: BIOPSY;  Surgeon: Lavena Bullion, DO;  Location: [facility];  Service: Endoscopy;;   BREAST EXCISIONAL BIOPSY     COLONOSCOPY WITH PROPOFOL N/A 12/22/2020   Procedure: COLONOSCOPY WITH PROPOFOL;  Surgeon: Lavena Bullion, DO;  Location: [facility];  Service: Endoscopy;  Laterality: N/A;   CYSTOSCOPY N/A 12/17/2020   Procedure: CYSTOSCOPY with biopsies;  Surgeon: Robley Fries, MD;  Location: [facility];  Service: Urology;  Laterality: N/A;   DILITATION & CURRETTAGE/HYSTROSCOPY WITH NOVASURE ABLATION N/A 08/26/2015   Procedure: DILATATION & CURETTAGE/HYSTEROSCOPY WITH NOVASURE ABLATION;  Surgeon: [name], MD;  Location: [place];  Service: Gynecology;  Laterality: N/A;   ESOPHAGOGASTRODUODENOSCOPY (EGD) WITH  PROPOFOL N/A 12/22/2020   Procedure: ESOPHAGOGASTRODUODENOSCOPY (EGD) WITH PROPOFOL;  Surgeon: Lavena Bullion, DO;  [facility]: [facility];  Service: Endoscopy;  Laterality: N/A;   EXCISIONAL LEFT BREAST BX  11-14-2007   benign   LAPAROSCOPIC TUBAL LIGATION Bilateral 08/26/2015   Procedure: LAPAROSCOPIC TUBAL LIGATION;  Surgeon: [name], MD;  Location: [place];  Service: Gynecology;  Laterality: Bilateral;   POLYPECTOMY  12/22/2020   Procedure: POLYPECTOMY;  Surgeon: Lavena Bullion, DO;  [facility]: [facility];  Service: Endoscopy;;   SHOULDER ARTHROSCOPY WITH ROTATOR CUFF REPAIR AND SUBACROMIAL DECOMPRESSION Right 04/08/2020   Procedure: SHOULDER ARTHROSCOPY WITH ROTATOR CUFF REPAIR AND SUBACROMIAL DECOMPRESSION;  Surgeon: Tania Ade, MD;  Location: [facility];  Service: Orthopedics;  Laterality: Right;  NEED 90 MINUTES FOR THIS CASE   THYROID LOBECTOMY Right 04-30-2008    Allergies: Tramadol  Medications: Prior to Admission medications   Medication Sig Start Date End Date Taking? Authorizing Provider  amLODipine (NORVASC) 5 MG tablet TAKE 1 TABLET (5 MG TOTAL) BY MOUTH DAILY. Patient taking differently: Take 5 mg by mouth daily. 03/06/20 03/06/21 Yes Fay Records, MD  carvedilol (COREG) 6.25 MG tablet TAKE 1 TABLET BY MOUTH 2 TIMES DAILY WITH A MEAL. Patient taking differently: Take 6.25 mg by mouth 2 (two) times daily with a meal. 03/06/20 03/06/21 Yes Fay Records, MD  hydrochlorothiazide (HYDRODIURIL) 25 MG tablet TAKE 1 TABLET (25 MG TOTAL) BY MOUTH DAILY. Patient taking differently: Take 25 mg by mouth daily. 03/06/20 03/06/21 Yes Fay Records, MD  metFORMIN (  GLUCOPHAGE) 500 MG tablet Take 1 tablet (500 mg total) by mouth daily. Patient taking differently: Take 500 mg by mouth daily. 07/10/20  Yes   olmesartan (BENICAR) 40 MG tablet TAKE 1 TABLET (40 MG TOTAL) BY MOUTH DAILY. Patient taking differently: Take 40 mg by mouth daily. 03/06/20 03/06/21 Yes Fay Records, MD  oxyCODONE (OXY IR/ROXICODONE) 5 MG immediate release tablet Take 1 tablet (5 mg total) by mouth every 6 (six) hours as needed for up to 20 doses for severe pain. 12/13/20  Yes Lafonda Mosses, MD  rosuvastatin (CRESTOR) 10 MG tablet TAKE 1 TABLET (10 MG TOTAL) BY MOUTH DAILY. Patient taking differently: Take 10 mg by mouth daily. 03/06/20 03/06/21 Yes Fay Records, MD  buPROPion [facility] SR) 200 MG 12 hr tablet Take 1 tablet (200 mg total) by mouth every morning. Patient not taking: No sig reported 10/10/20   Dennard Nip D, MD  dexamethasone (DECADRON) 1 MG tablet Take 1 tablet (1 mg total) by mouth at bedtime at 11PM prior to 8AM cortisol lab draw Patient not taking: No sig reported 11/18/20     HYDROcodone-acetaminophen (NORCO/VICODIN) 5-325 MG tablet Take 1 tablet by mouth every 4 (four) hours as needed. Patient not taking: No sig reported 12/05/20   Isla Pence, MD  metroNIDAZOLE (FLAGYL) 500 MG tablet Take 1 tablet (500 mg total) by mouth 2 (two) times daily. One po bid x 7 days Patient not taking: No sig reported 12/01/20   Malvin Johns, MD  oxyCODONE-acetaminophen (PERCOCET/ROXICET) 5-325 MG tablet Take 1 tablet by mouth every 6 (six) hours as needed for severe pain. Patient not taking: No sig reported 12/05/20   Isla Pence, MD  phenazopyridine (PYRIDIUM) 200 MG tablet Take 1 tablet (200 mg total) by mouth 3 (three) times daily as needed for pain. Patient not taking: No sig reported 11/27/20   Caccavale, Sophia, PA-C  Semaglutide, 1 MG/DOSE, (OZEMPIC, 1 MG/DOSE,) 4 MG/3ML SOPN Inject 1 mg into the skin once a week. Patient not taking: No sig reported 10/10/20   Dennard Nip D, MD  traMADol (ULTRAM) 50 MG tablet Take 1 tablet (50 mg total) by mouth every 6 (six) hours for 5 days Patient not taking: No sig reported 12/11/20     Vitamin D, Ergocalciferol, (DRISDOL) 1.25 MG (50000 UNIT) CAPS capsule Take 1 capsule (50,000 Units total) by mouth every 7 (seven)  days. Patient not taking: No sig reported 10/10/20   Dennard Nip D, MD     Family History  Problem Relation Age of Onset   Stroke Mother    Diabetes Mother    Hypertension Mother    Obesity Mother    Glaucoma Father    Alcoholism Father    Breast cancer Maternal Aunt    Renal cancer Maternal Uncle    Prostate cancer Maternal Grandfather    Heart disease Paternal Grandmother        enlarged heart   Diabetes Half-Sister    Hypertension Half-Sister    Colon cancer Neg Hx    Esophageal cancer Neg Hx    Stomach cancer Neg Hx    Colon polyps Neg Hx    Rectal cancer Neg Hx    Ovarian cancer Neg Hx    Endometrial cancer Neg Hx    Pancreatic cancer Neg Hx     Social History   Socioeconomic History   Marital status: Widowed    Spouse name: Not on file   Number of children: 0   Years of  education: Not on file   Highest education level: Not on file  Occupational History   Occupation: sterile processing tech    Employer: [employer]  Tobacco Use   Smoking status: Former    Years: 15.00    Types: Cigarettes    Quit date: 2001    Years since quitting: 21.8   Smokeless tobacco: Never  Vaping Use   Vaping Use: Never used  Substance and Sexual Activity   Alcohol use: No   Drug use: Yes    Types: Marijuana   Sexual activity: Never  Other Topics Concern   Not on file  Social History Narrative   Not on file   Social Determinants of Health   Financial Resource Strain: Not on file  Food Insecurity: Not on file  Transportation Needs: Not on file  Physical Activity: Not on file  Stress: Not on file  Social Connections: Not on file      Review of Systems currently denies fever, headache, chest pain, worsening dyspnea, cough, back pain, nausea, vomiting or bleeding.  She does have some left lower quadrant discomfort.  Vital Signs: BP (!) 148/81 (BP Location: Left Arm)   Pulse 95   Temp 97.9 F (36.6 C) (Oral)   Resp 18   Ht 5\' 4"  (1.626 m)   Wt 279 lb 15.8 oz  (127 kg)   SpO2 96%   BMI 48.06 kg/m   Physical Exam awake, alert.  Chest clear to auscultation bilaterally anteriorly.  Heart with regular rate and rhythm.  Abdomen obese, soft, positive bowel sounds, some tenderness left lower quadrant region to palpation; no lower extremity edema  Imaging: DG Chest 2 View  Result Date: 12/16/2020 CLINICAL DATA:  Shortness of breath in a 55 year old female. EXAM: CHEST - 2 VIEW COMPARISON:  July 10, 2009. FINDINGS: Linear opacities in the LEFT chest of progressed slightly since the previous study. No lobar consolidation. Cardiomediastinal contours and hilar structures are stable. No sign of effusion. Mildly increased density in the subcarinal region on lateral projection. No acute skeletal process on limited assessment. IMPRESSION: Signs of scarring or atelectasis in the LEFT chest, slightly progressed since the previous study. Question developing subcarinal adenopathy, based on lateral projection. Developing lower lobe airspace disease is also a differential consideration. In a patient with pelvic masses on recent pelvic MRI would consider follow-up chest CT for further evaluation. Electronically Signed   By: Zetta Bills M.D.   On: 12/16/2020 14:13   CT CHEST W CONTRAST  Result Date: 12/17/2020 CLINICAL DATA:  Cancer of unknown primary.  Staging. EXAM: CT CHEST WITH CONTRAST TECHNIQUE: Multidetector CT imaging of the chest was performed during intravenous contrast administration. CONTRAST:  56mL OMNIPAQUE IOHEXOL 350 MG/ML SOLN COMPARISON:  Chest CT dated 06/01/2007. FINDINGS: Cardiovascular: There is no cardiomegaly or pericardial effusion. Three-vessel coronary vascular calcification. Mild atherosclerotic calcification of the thoracic aorta. No aneurysmal dilatation. The origins of the great vessels of the aortic arch appear patent as visualized. There is dilatation of the main pulmonary trunk suggestive of pulmonary hypertension. Evaluation of the pulmonary  arteries is limited due to respiratory motion artifact and suboptimal opacification and timing of the contrast. No large or central pulmonary artery embolus identified. Mediastinum/Nodes: No hilar or mediastinal adenopathy. The esophagus is grossly unremarkable. Prior right hemithyroidectomy. No mediastinal fluid collection. Lungs/Pleura: Bibasilar subpleural atelectasis/scarring. There is no pleural effusion pneumothorax. The central airways are patent. Upper Abdomen: Indeterminate 3 cm left renal nodule, present on the prior CT, likely a benign  etiology such as adenoma. Musculoskeletal: Degenerative changes of the spine. No acute osseous pathology. IMPRESSION: 1. No acute intrathoracic pathology. No CT evidence of central pulmonary artery embolus. 2. Dilatation of the main pulmonary trunk suggestive of pulmonary hypertension. 3. Status post prior right hemithyroidectomy. 4. Bilateral linear atelectasis/scarring. 5. Aortic Atherosclerosis (ICD10-I70.0). Electronically Signed   By: Anner Crete M.D.   On: 12/17/2020 21:09   MR Lumbar Spine W Wo Contrast  Result Date: 12/16/2020 CLINICAL DATA:  Initial evaluation for low back pain, cancer suspected. EXAM: MRI LUMBAR SPINE WITHOUT AND WITH CONTRAST TECHNIQUE: Multiplanar and multiecho pulse sequences of the lumbar spine were obtained without and with intravenous contrast. CONTRAST:  42mL GADAVIST GADOBUTROL 1 MMOL/ML IV SOLN COMPARISON:  Prior CT from earlier the same day. FINDINGS: Segmentation: Standard. Lowest well-formed disc space labeled the L5-S1 level. Alignment: 4 mm facet mediated anterolisthesis of L4 on L5. Alignment otherwise normal with preservation of the normal lumbar lordosis. Vertebrae: Diffusely abnormal appearance of the bone marrow is seen throughout the visualized lumbar spine and pelvis. Associated heterogeneous STIR hyperintensity with irregular heterogeneous postcontrast enhancement. Findings are highly suspicious for diffuse osseous  metastatic disease. Involvement appears to be most pronounced within the T12 vertebral body as well as the visualized pelvis. Exact measurements of a discrete lesion is difficult given the overall infiltrative appearance of this finding. No associated pathologic fracture. No visible extra osseous extension of tumor at this time. Conus medullaris and cauda equina: Conus extends to the L1 level. Conus and cauda equina appear normal. No visible epidural or intracanalicular tumor. Paraspinal and other soft tissues: Mild edema within the subcutaneous fat of the lower back, which could be related to overall volume status. Paraspinous soft tissues demonstrate no other acute finding. Retroperitoneal/iliac adenopathy noted. Left larger than right adnexal masses partially visualized. Findings better characterized on prior CT. Disc levels: L1-2:  Unremarkable. L2-3: Disc desiccation with mild disc bulge. Superimposed small right foraminal to extraforaminal disc protrusion (series 11, image 17). Mild facet hypertrophy. Underlying short pedicles with a degree of mild spinal stenosis. Mild bilateral L2 foraminal narrowing. L3-4: Disc desiccation with mild disc bulge. Superimposed shallow left extraforaminal disc protrusion with annular fissure (series 11, image 24). Mild facet hypertrophy. Underlying short pedicles. Mild spinal stenosis. Foramina remain patent. L4-5: 4 mm anterolisthesis. Disc desiccation with broad-based posterior pseudo disc bulge. Biforaminal annular fissures noted, left larger than right. Moderate bilateral facet arthrosis. Resultant moderate spinal stenosis. Mild to moderate bilateral L4 foraminal narrowing. L5-S1: Normal interspace. Mild right greater than left facet hypertrophy. No stenosis. IMPRESSION: 1. 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 No associated pathologic fracture or extra  osseous extension of tumor at this time. 2. Retroperitoneal/iliac adenopathy with left larger than right adnexal masses, better characterized on prior CT. 3. Underlying multilevel degenerative spondylosis as above, most pronounced at L4-5 where there is resultant moderate spinal stenosis. Electronically Signed   By: Jeannine Boga M.D.   On: 12/16/2020 23:44   MR PELVIS W WO CONTRAST  Result Date: 12/05/2020 CLINICAL DATA:  55 year old female with indeterminate hypoechoic left adnexal mass identified in the setting of left lower quadrant pain. EXAM: MRI PELVIS WITHOUT AND WITH CONTRAST TECHNIQUE: Multiplanar multisequence MR imaging of the pelvis was performed both before and after administration of intravenous contrast. CONTRAST:  24mL GADAVIST GADOBUTROL 1 MMOL/ML IV SOLN COMPARISON:  12/01/2020 pelvic sonogram and unenhanced CT abdomen/pelvis. FINDINGS: Urinary Tract:  Mild diffuse bladder wall thickening. There is a poorly marginated enhancing solid 3.7 x 2.8 x 3.0 cm mass centered at the urethra (series 32/image 42 and series 30/image 67), extending from the vesicourethral junction throughout the length of the urethra. There appears to be an underlying right periurethral diverticulum (series 5/image 27). At the inferior tip of the urethra, there is a simple 0.9 x 0.9 x 0.9 cm cystic lesion compatible with a Skene's duct cyst. Bowel: Visualized small and large bowel are normal caliber with no bowel wall thickening. Vascular/Lymphatic: Mild bilateral inguinal lymphadenopathy measuring up to 1.4 cm short axis diameter on the right (series 19/image 71) and 1.4 cm on the left (series 19/image 70). Moderate bilateral external iliac lymphadenopathy measuring up to 1.7 cm bilaterally (series 19/image 53 on the right and 56 on the left). Bilateral common iliac lymphadenopathy, largest 1.6 cm on the right (series 19/image 27) and 1.7 cm on the left (series 19/image 14). Reproductive: Uterus: The anteverted  uterus measures 8.2 x 3.7 x 5.5 cm. No uterine fibroids. Inner myometrium (junctional zone) measures 12 mm in thickness, which is compatible with mild diffuse uterine adenomyosis. Endometrium measures 4 mm in bilayer thickness, which is within normal limits. No endometrial cavity fluid or focal endometrial mass. Poorly marginated enhancing solid mass of the left uterine cervix measuring 2.4 x 2.3 x 3.0 cm (series 5/image 21 and series 8/image 22), which appears to disrupt the normal cervical fibrous stroma without frank parametrial invasion. Ovaries and Adnexa: The right ovary measures 4.0 x 2.4 x 4.3 cm and contains multiple solid avidly enhancing masses, largest 2.3 x 2.0 cm (series 19/image 33). The left ovary measures 8.3 x 7.7 x 8.5 cm and is replaced by a predominantly solid avidly enhancing mass. Other: No abnormal free fluid in the pelvis. No focal pelvic fluid collection. Musculoskeletal: Diffuse patchy confluent nodular replacement of the pelvic osseous structures, best seen on axial series 7. IMPRESSION: 1. Poorly marginated enhancing 3.7 x 2.8 x 3.0 cm mass centered at the urethra, extending from the vesicourethral junction throughout the length of the female urethra, with probable underlying right periurethral diverticulum. This mass is concerning for malignancy. 2. Solid avidly enhancing bilateral ovarian masses, largest 2.3 cm on the right and 8.3 cm on the left, worrisome for bilateral ovarian metastases. 3. Poorly marginated enhancing 2.4 x 2.3 x 3.0 cm mass in the left uterine cervix, which appears to disrupt the normal cervical fibrous stroma without frank parametrial invasion, suspicious for malignancy. 4. Mild-to-moderate bilateral common iliac, bilateral external iliac and bilateral inguinal lymphadenopathy, suspicious for metastatic disease. 5. Diffuse patchy confluent nodular replacement of the pelvic osseous structures, suspicious for osseous metastatic disease. 6. Mild diffuse uterine  adenomyosis. Electronically Signed   By: Ilona Sorrel M.D.   On: 12/05/2020 20:31   US Pelvis Complete  Result Date: 12/16/2020 CLINICAL DATA:  Pelvic pain EXAM: TRANSABDOMINAL ULTRASOUND OF PELVIS DOPPLER ULTRASOUND OF OVARIES TECHNIQUE: Transabdominal ultrasound examination of the pelvis was performed including evaluation of the uterus, ovaries, adnexal regions, and pelvic cul-de-sac. Color and duplex Doppler ultrasound was utilized to evaluate blood flow to the ovaries. COMPARISON:  CT 12/16/2020, MRI 12/05/2020, pelvic ultrasound 12/01/2020 FINDINGS: Uterus Measurements: 6.8 x 3.4 x 5 cm = volume: 60.5 mL. No fibroids or other mass visualized. Endometrium Poorly visible, unable to measure. Right ovary Measurements: 4.8 x 2.9 x 5.5 cm = volume: 38.6 mL. Solid right adnexal mass replaces most of right ovary. Left ovary Measurements: 10.5 x 8.7 x  9.2 cm = volume: 440 mL. Enlarged left ovary with heterogenous solid mass that replaces the left ovary. Previously the left ovary measured 8.3 x 7.7 x 8.5 cm on MRI. Pulsed Doppler evaluation demonstrates normal arterial and venous waveforms in both or adnexa, though note that both ovaries are essentially replaced by mass lesions. Other: No significant ascites. Foley catheter within the bladder which appears somewhat thick walled. IMPRESSION: 1. Very limited exam secondary to habitus and only trans abdominal technique 2. Bilateral solid adnexal masses essentially replacing the ovaries which thereby limits assessment of the ovaries. Left adnexal solid mass lesion has significantly increased in size; documented flow in the adnexa relates to flow within the solid adnexal masses. Gynecology follow-up recommended. Electronically Signed   By: Donavan Foil M.D.   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 EXAM: CT  ABDOMEN AND PELVIS WITH CONTRAST TECHNIQUE: Multidetector CT imaging of the abdomen and pelvis was performed using the standard protocol following bolus administration of intravenous contrast. CONTRAST:  65mL OMNIPAQUE IOHEXOL 350 MG/ML SOLN COMPARISON:  Comparison is made with multiple prior studies which were performed recently, most recent comparison is an MRI of the pelvis of December 05, 2020. FINDINGS: Lower chest: Basilar atelectasis. Subcarinal region not well evaluated. No consolidation or effusion at the lung bases. Hepatobiliary: No focal, suspicious hepatic lesion. No pericholecystic stranding. Portal vein is patent. No biliary duct dilation. Pancreas: Pancreas with normal contours. No signs of adjacent inflammation. Spleen: Spleen normal size and contour. Adrenals/Urinary Tract: Well-circumscribed LEFT adrenal lesion, density value of 19 Hounsfield units on previous noncontrast imaging measuring 3.2 x 2.6 cm., relative washout is indeterminate at 30%. Normal RIGHT adrenal. Normal, symmetric enhancement of bilateral kidneys without focal renal lesion. Stomach/Bowel: Small hiatal hernia. No stranding adjacent to the stomach. No sign of small bowel obstruction or acute small bowel process. The appendix is normal. Stool in various parts of the colon without signs of obstruction or adjacent stranding. Vascular/Lymphatic: Retroperitoneal adenopathy (image 50/2) 14 mm lymph node previously 11 mm along the LEFT common iliac chain. LEFT pelvic sidewall lymph node (image 75/2) 14 mm short axis, previously 13 mm. Similar size of RIGHT pelvic sidewall/external iliac lymph nodes also with little change compared to the most recent comparison imaging. Reproductive: Foley catheter in situ. Uterus and cervix not well at assess nor are the masses within the area of the cervix and urethra seen on previous MRI. Increasing size of LEFT ovary with heterogeneous appearance now measuring 10 x 8.8 cm. When assessed on the study  of October 20, 2020 the ovary for reference measured 3.7 x 2.5 cm. RIGHT ovary with area of enhancement shows enlargement as well but not as pronounced as on previous imaging with 2.8 cm area of enhancement in the RIGHT ovary as the largest discrete area which is similar to the recent MRI evaluation. As compared to the study of October 20, 2020 there is little change in the appearance of the RIGHT ovary aside from this area though there is an increasingly nodular appearance of the ovary in this location. Mass posterior to the LEFT ovary just above the uterus measuring 4.4 x 2.8 cm has enlarged from 2.5 cm. Other: Small volume ascites. Musculoskeletal: Heterogeneous pattern associated with the spine in terms of density raises the question of metastatic disease without discrete measurable focal lesion in the spine.  For example a geographic area of sclerosis on image 101/5) in the anterior L3 vertebral body does raise the question of underlying lesion with numerous additional foci, essentially nearly all levels of the spine with some degree of heterogeneity. Another example of this finding is noted on the RIGHT at the T10 level. No acute bone finding or frankly destructive bone process. IMPRESSION: Rapid increase in size of the LEFT ovary with some adjacent ascites. The rapid enlargement is seen in the setting of generalized worsening of masses associated with the LEFT and RIGHT ovary but perhaps more pronounced than other areas in the pelvis. The possibility of torsion associated with an ovarian mass could be considered based on this finding. Would correlate with any worsening abdominal pain and with sonogram as warranted for further assessment. Enlarging masses elsewhere though not to the extent that is seen with the LEFT ovary. Masslike area of the cervix and potentially within a urethral diverticulum not as well seen as on the recent MRI evaluation. Small volume ascites. Well-circumscribed LEFT adrenal lesion  measuring 3.2 x 2.6 cm, density value of 19 Hounsfield units on previous noncontrast imaging. Relative washout is indeterminate at 30%. This may represent an adrenal adenoma. Consider dedicated adrenal protocol CT or attention on PET evaluation if performed. Heterogeneous pattern of subtle sclerosis and lucency in the spine on today's study raising the question of bony metastatic disease in the spine. Findings of increased ovarian size on the LEFT out of proportion other areas were called by telephone at the time of interpretation on 12/16/2020 at 5:44 pm to provider Theodis Blaze , who verbally acknowledged these results. Electronically Signed   By: Zetta Bills M.D.   On: 12/16/2020 17:46   [place] Art/Ven Flow Abd Pelv Doppler  Result Date: 12/16/2020 CLINICAL DATA:  Pelvic pain EXAM: TRANSABDOMINAL ULTRASOUND OF PELVIS DOPPLER ULTRASOUND OF OVARIES TECHNIQUE: Transabdominal ultrasound examination of the pelvis was performed including evaluation of the uterus, ovaries, adnexal regions, and pelvic cul-de-sac. Color and duplex Doppler ultrasound was utilized to evaluate blood flow to the ovaries. COMPARISON:  CT 12/16/2020, MRI 12/05/2020, pelvic ultrasound 12/01/2020 FINDINGS: Uterus Measurements: 6.8 x 3.4 x 5 cm = volume: 60.5 mL. No fibroids or other mass visualized. Endometrium Poorly visible, unable to measure. Right ovary Measurements: 4.8 x 2.9 x 5.5 cm = volume: 38.6 mL. Solid right adnexal mass replaces most of right ovary. Left ovary Measurements: 10.5 x 8.7 x 9.2 cm = volume: 440 mL. Enlarged left ovary with heterogenous solid mass that replaces the left ovary. Previously the left ovary measured 8.3 x 7.7 x 8.5 cm on MRI. Pulsed Doppler evaluation demonstrates normal arterial and venous waveforms in both or adnexa, though note that both ovaries are essentially replaced by mass lesions. Other: No significant ascites. Foley catheter within the bladder which appears somewhat thick walled. IMPRESSION: 1.  Very limited exam secondary to habitus and only trans abdominal technique 2. Bilateral solid adnexal masses essentially replacing the ovaries which thereby limits assessment of the ovaries. Left adnexal solid mass lesion has significantly increased in size; documented flow in the adnexa relates to flow within the solid adnexal masses. Gynecology follow-up recommended. Electronically Signed   By: Donavan Foil M.D.   On: 12/16/2020 19:23   CT Renal Stone Study  Result Date: 12/01/2020 CLINICAL DATA:  Flank pain.  Evaluate for kidney stone. EXAM: CT ABDOMEN AND PELVIS WITHOUT CONTRAST TECHNIQUE: Multidetector CT imaging of the abdomen and pelvis was performed following the standard protocol without IV contrast.  COMPARISON:  10/20/2020 FINDINGS: Lower chest: Subsegmental atelectasis versus scar is identified within both lung bases. Hepatobiliary: Mild hepatic steatosis. No focal liver abnormality identified. Gallbladder unremarkable. Pancreas: Unremarkable. No pancreatic ductal dilatation or surrounding inflammatory changes. Spleen: Normal in size without focal abnormality. Right adrenal gland appears normal. Left adrenal mass measures 3.3 x 2.5 cm. Unchanged from 06/02/2016 compatible with a benign adrenal adenoma. Adrenals/Urinary Tract: No kidney stones are identified bilaterally. No signs of hydronephrosis or hydroureter. Mild circumferential wall thickening of the urinary bladder is identified. Stomach/Bowel: Stomach appears normal. The appendix is visualized and appears normal. Moderate stool burden is identified within the colon up to the level of the rectum. No bowel wall thickening, inflammation, or distension. Vascular/Lymphatic: Aortic atherosclerosis.  No aneurysm. Signs of retroperitoneal and pelvic adenopathy, including: 1.1 cm left periaortic node, image 52/2. Increased from 1 cm previously. Left external iliac lymph node measures 1.1 cm, image 71/2. previously 7 mm. Right pelvic sidewall lymph node  measures 1.1 cm. This is unchanged from previous exam. Within the right inguinal region there is a 1.3 cm lymph node, image 85/2. Previously 0.9 cm. Left inguinal region there is a 1.4 cm node, image 84/2. Formally 1 cm. Reproductive: There is increased soft tissue fullness in the region of the cervix which appears unchanged from 10/20/2020. This measures 4.9 x 3.5 cm, image 76/2. Marked interval enlargement of left ovary. The left ovary has a masslike appearance measuring 8.4 x 6.6 cm, image 66/2. Formally this measured 4.5 x 2.7 cm. The right ovary measures 4.0 by 3.6 cm, image 66/2. Formally 4.6 by 2.7 cm. Other: No ascites or focal fluid collections. No signs of peritoneal nodularity. Small periumbilical hernia contains fat only. Supraumbilical fat containing hernia measures 3.3 cm, image 67/6. Infraumbilical hernia also contains fat only measuring 3.4 cm, image 70/6. Fat containing left inguinal hernia noted, small. Musculoskeletal: No acute or suspicious osseous findings. IMPRESSION: 1. No urinary tract calculi identified. 2. Marked interval enlargement of left ovary with masslike appearance. This is new finding compared with 10/20/2020. Given the short interval findings are favored to represent an acute process such as a hemorrhagic cyst. Tubo-ovarian abscess is less favored but not entirely excluded. Malignancy is also a differential consideration but considered less likely given the short interval from 10/20/2020. Initial workup with transabdominal/transvaginal pelvic sonogram is recommended. If the pelvic ultrasound is inconclusive then contrast enhanced pelvic MRI may be considered when the patient is clinically stable, is able to follow instructions, and remain motionless. Ideally this should be performed on a nonemergent basis. 3. Soft tissue fullness in the region of the cervix is again noted and appears similar to previous exam. Etiology is indeterminate. Advise correlation with cervical cancer  screening. 4. Retroperitoneal and pelvic adenopathy identified. In the setting of infection this may reflect reactive adenopathy. Differential considerations include metastatic disease as well as lymphoproliferative disorders. 5. Multiple fat containing ventral abdominal wall and left inguinal hernias. 6. Hepatic steatosis. 7. Stable left adrenal adenoma. 8. Aortic Atherosclerosis (ICD10-I70.0). Electronically Signed   By: Kerby Moors M.D.   On: 12/01/2020 06:43   US PELVIC COMPLETE W TRANSVAGINAL AND TORSION R/O  Result Date: 12/01/2020 CLINICAL DATA:  LLQ Pain EXAM: TRANSABDOMINAL AND TRANSVAGINAL ULTRASOUND OF PELVIS TECHNIQUE: Both transabdominal and transvaginal ultrasound examinations of the pelvis were performed. Transabdominal technique was performed for global imaging of the pelvis including uterus, ovaries, adnexal regions, and pelvic cul-de-sac. It was necessary to proceed with endovaginal exam following the transabdominal exam to visualize  the endometrium and uterus. COMPARISON:  December 01, 2020, September eleventh 2022 FINDINGS: Uterus Uterus is only visualized transvaginally and is suboptimally assessed due to patient incomplete voiding and body habitus. Measurements: 8.8 x 3.6 x 5.0 cm = volume: 84 mL. Bulkiness of the cervix. Endometrium Poorly visualized due to distension of the bladder on transvaginal imaging. Right ovary Not visualized transabdominally or transvaginally. Left adnexal mass: Adnexal mass is labeled as uterus on transabdominal imaging. It measures 9.5 x 6.9 x 7.5 cm for a volume of 260 ML. It is predominately hypoechoic. There are internal anechoic spaces consistent with cystic density within the mass. This corresponds to the mass seen on CT. Doppler imaging was not performed of this mass, limiting evaluation for blood flow. Other findings No abnormal free fluid. IMPRESSION: 1. There is a hypoechoic LEFT adnexal mass which measures approximately 9.5 cm with central internal  cystic density. Given newly conspicuous appearance since September 2022, this may reflect a hemorrhagic cyst or sequela of torsed ovary. Recommend GYN consult, and consideration of pelvis MRI w/o and w/ contrast if clinically warranted. Note: This recommendation does not apply to premenarchal patients or to those with increased risk (genetic, family history, elevated tumor markers or other high-risk factors) of ovarian cancer. Reference: Radiology 2019 Nov; 293(2):359-371. 2. Bulkiness of the cervix, suboptimally assessed with current technique. Recommend correlation with physical exam. Electronically Signed   By: Valentino Saxon M.D.   On: 12/01/2020 10:22    Labs:  CBC: Recent Labs    12/22/20 0433 12/23/20 0410 12/24/20 0412 12/25/20 0415  WBC 13.6* 12.8* 13.2* 13.1*  HGB 8.5* 8.1* 8.1* 8.3*  HCT 26.5* 25.7* 25.4* 26.1*  PLT 261 270 284 258    COAGS: No results for input(s): INR, APTT in the last 8760 hours.  BMP: Recent Labs    03/08/20 1001 04/04/20 1329 12/22/20 0433 12/23/20 0410 12/24/20 0412 12/25/20 0415  NA 141   < > 139 138 140 132*  K 4.3   < > 3.6 3.8 4.1 3.8  CL 105   < > 104 104 107 101  CO2 25   < > 27 26 24 22   GLUCOSE 143*   < > 137* 119* 132* 130*  BUN 21   < > 24* 21* 23* 19  CALCIUM 10.1   < > 9.3 9.4 9.5 8.9  CREATININE 0.96   < > 0.77 0.80 0.81 0.77  GFRNONAA 67   < > >60 >60 >60 >60  GFRAA 78  --   --   --   --   --    < > = values in this interval not displayed.    LIVER FUNCTION TESTS: Recent Labs    12/20/20 0422 12/21/20 0522 12/22/20 0433 12/23/20 0410  BILITOT 0.6 0.4 0.6 0.6  AST 29 39 35 29  ALT 15 18 18 15   ALKPHOS 706* 684* 672* 631*  PROT 6.2* 6.1* 6.1* 6.1*  ALBUMIN 2.0* 2.1* 2.1* 2.1*    TUMOR MARKERS: No results for input(s): AFPTM, CEA, CA199, CHROMGRNA in the last 8760 hours.  Assessment and Plan: 55 y.o. 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  Request now received from oncology for Port-A-Cath placement to assist with treatment.Risks and benefits of image guided port-a-catheter placement was discussed with the patient including, but not limited to bleeding, infection, pneumothorax,  or fibrin sheath development and need for additional procedures.  All of the patient's questions were answered, patient is agreeable to proceed. Consent signed and in chart.  Patient would also like to discuss port placement with family.  We will tentatively schedule procedure for tomorrow or Friday of this week if all parties in agreement.   Thank you for this interesting consult.  I greatly enjoyed meeting Erin [name] and look forward to participating in their care.  A copy of this report was sent to the requesting provider on this date.  Electronically Signed: D. Rowe Robert, PA-C 12/25/2020, 9:44 AM   I spent a total of   25 minutes  in face to face in clinical consultation, greater than 50% of which was counseling/coordinating care for Port-A-Cath placement

## 2020-12-25 NOTE — Assessment & Plan Note (Addendum)
-   s/p MRI pelvis on 12/05/20   Notable for poorly marginated enhancing 3.7 x 2.8 x 3.0 cm mass centered at urethra concerning for malignancy -- solidly avidly enhancing bilateral ovarian masses, largest 2.3 cm on R and 8.3 cm on L concerning for bilateral ovarian mets -- poorly marginated enhancing 2.4x2.3x3.0 cm mass in L uterine cervix, concerning for malignancy -- bilateral common iliac, bilateral external iliac, and bilateral inguinal LAD, concerning for metastatic disease -- diffuse patchy confluent nodular replacement of the pelvic osseus structures, concerning for osseus metastatic disease (see report)  -Biopsy taken during cystoscopy on 12/17/2020  - biopsy has returned on 11/11. 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  gastric nodule bx and notable for adenocarcinoma, poorly differentiated with signet ring morphology.  Focal invastion of muscularis mucosa.  Muscularis propia not present in bx.  Per path reports, this most likely represents primary source of metastatic carcinoma dx on bx of vagina on 11/8.   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

## 2020-12-25 NOTE — Assessment & Plan Note (Addendum)
Biopsies from anterior vaginal wall, bladder neck, and cervix are positive for poorly differentiated adenocarcinoma with signet ring cell features.   gastric nodule bx and notable for adenocarcinoma, poorly differentiated with signet ring morphology.  Focal invastion of muscularis mucosa.  Muscularis propia not present in bx.  Per path reports, this most likely represents primary source of metastatic carcinoma dx on bx of vagina on 11/8.  Outpatient PET study pending for outpatient  Appreciate oncology and gyn onc

## 2020-12-25 NOTE — Assessment & Plan Note (Signed)
S/p ceftriaxone Culture with strep viridans resolved

## 2020-12-25 NOTE — Progress Notes (Signed)
PROGRESS NOTE    Erin [name]  GMW:[id number] DOB: 08/14/1965 DOA: 12/16/2020 PCP: Pcp, No   Chief Complaint  Patient presents with   Leg Pain   Abdominal Pain    Brief Narrative:  Erin [name] is Erin [name] 55 y.o. female with PMH OSA on CPAP, HTN, DMII, morbid obesity who presented with worsening abdominal pain and left thigh pain.    She has been undergoing workup for recently discovered cervical/urethral masses on CT in Sept 2022.  Further imaging showed ongoing interval enlargement of the left adnexal area.  There was also associated retroperitoneal and pelvic adenopathy.  MRI pelvis also performed on 12/05/2020 showed poorly enhancing mass centered at the urethra, solid enhancing bilateral ovarian masses, poorly enhancing mass involving left uterine cervix, and significant adenopathy involving bilateral common iliac, bilateral external iliac, bilateral inguinal lymph nodes.  Also noted to have pelvic osseous lesions concerning for metastatic disease as well.   She was admitted for biopsy with urology and GYN oncology.  She was able to undergo cystoscopy with urethral and bladder biopsy on 12/17/2020.  She also underwent cervical biopsies following this procedure.  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  gastric nodule bx and notable for adenocarcinoma, poorly differentiated with signet ring morphology.  Focal invastion of muscularis mucosa.  Muscularis propia not present in bx.  Per path reports, this most likely represents primary source of metastatic carcinoma dx on bx of vagina on 11/8.  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   gastric nodule bx and notable for adenocarcinoma, poorly differentiated with signet ring morphology.  Focal invastion of muscularis mucosa.  Muscularis propia not present in bx.  Per path reports, this most likely represents primary source of metastatic carcinoma dx on bx of vagina on 11/8.  Outpatient PET study pending for outpatient  Appreciate oncology and gyn onc   Elevated CEA CEA 1864, CA 125 29 on admission  Gastritis and gastroduodenitis EGD with gastritis PPI x 6 weeks then 40 mg daily bx as noted above  Colonoscopy with 5 polyps removed (1 in ascending and 4 in sigmoid) bx as noted above  Ovarian mass Noted on MRI pelvis Workup as noted above  Abdominal pain 2/2 metastatic cancer Continue to adjust pain regimen as needed  Acute lower UTI-resolved as of 12/24/2020 S/p ceftriaxone Culture with strep viridans resolved   Constipation-resolved as of 12/24/2020 resolved  Malnutrition of moderate degree - Moderate  malnutrition in context of acute illness/injury, morbid obesity - Evaluated by dietitian, appreciate assistance - per RD: "Moderate Malnutrition related to  acute illness as evidenced by percent weight loss, moderate muscle depletion, energy intake < 75% for > 7 days."  Hypokalemia Replace and follow  OSA (obstructive sleep apnea) CPAP  Diabetes mellitus ([place]) SSI  Essential hypertension Amlodipine, coreg, follow    DVT prophylaxis: lovenox Code Status: full Family Communication: niece at bedside Disposition:   Status is: Inpatient  Remains inpatient appropriate because: unsafe d/c plan       Consultants:  Oncology IR Urology GI Palliative care Gyn oncology  Procedures:  Diagnostic cystoscopy, bladder biopsy, urethral biopsy, 12/17/20 Cervical biopsies, 12/17/2020 EGD and colonoscopy 12/22/20  Antimicrobials: Anti-infectives (From admission, onward)    Start     Dose/Rate Route Frequency Ordered Stop   12/23/20 1000  cefTRIAXone (ROCEPHIN) 1 g in sodium chloride 0.9 % 100 mL IVPB        1 g 200 mL/hr over 30 Minutes Intravenous Every 24 hours 12/23/20 0746 12/24/20 1030   12/20/20 1000  cefTRIAXone (ROCEPHIN) 1 g in sodium chloride 0.9 % 100 mL IVPB        1 g 200 mL/hr over 30 Minutes Intravenous Every 24 hours 12/20/20 0752 12/22/20 1451   12/17/20 0930  ceFAZolin (ANCEF) 3 g in dextrose 5 % 50 mL IVPB  Status:  Discontinued        3 g 100 mL/hr over 30 Minutes Intravenous  Once 12/17/20 0925 12/17/20 0926   12/17/20 0915  ceFAZolin (ANCEF) IVPB 3g/100 mL premix        3 g 200 mL/hr over 30 Minutes Intravenous  Once 12/17/20 0914 12/17/20 0935       Subjective: C/o pain  Objective: Vitals:   12/24/20 1400 12/24/20 2112 12/25/20 0531 12/25/20 1237  BP: (!) 149/97 (!) 153/81 (!) 148/81 (!) 157/94  Pulse: 98 98 95 98  Resp: 18 18 18 18   Temp: 97.9 F (36.6 C)   98.3 F (36.8 C)  TempSrc: Oral   Oral  SpO2: 96% 95% 96% 95%  Weight:      Height:        Intake/Output Summary (Last 24 hours) at 12/25/2020 1929 Last data filed at 12/25/2020 1800 Gross per 24 hour  Intake 720 ml  Output 1450 ml   Net -730 ml   Filed Weights   12/16/20 1307 12/17/20 0841 12/22/20 0928  Weight: 127 kg 127 kg 127 kg    Examination:  General exam: Appears calm and comfortable  Respiratory system: Clear to auscultation. Respiratory effort normal. Cardiovascular system: S1 & S2 heard, RRR. No JVD, murmurs, rubs, gallops or clicks. No pedal edema. Gastrointestinal system: Abdomen is nondistended, soft and nontender. No organomegaly or masses felt. Normal bowel sounds heard. Central nervous system: Alert and oriented. No focal neurological deficits. Extremities: Symmetric 5 x 5 power. Skin: No rashes, lesions or ulcers Psychiatry: Judgement and insight appear normal. Mood & affect appropriate.     Data Reviewed: I have personally reviewed following labs and imaging studies  CBC: Recent Labs  Lab 12/21/20 0522 12/22/20 0433 12/23/20 0410 12/24/20 0412 12/25/20 0415  WBC 13.0* 13.6* 12.8* 13.2* 13.1*  NEUTROABS 9.4* 9.6* 8.9* 9.4* 9.8*  HGB 9.2* 8.5* 8.1* 8.1* 8.3*  HCT 27.9* 26.5* 25.7* 25.4* 26.1*  MCV 91.5 94.0 96.3 93.7 95.3  PLT 256 261 270 284 751    Basic Metabolic Panel: Recent Labs  Lab 12/21/20 0522 12/22/20  5784 12/23/20 0410 12/24/20 0412 12/25/20 0415  NA 136 139 138 140 132*  K 4.2 3.6 3.8 4.1 3.8  CL 101 104 104 107 101  CO2 27 27 26 24 22   GLUCOSE 140* 137* 119* 132* 130*  BUN 26* 24* 21* 23* 19  CREATININE 0.66 0.77 0.80 0.81 0.77  CALCIUM 9.6 9.3 9.4 9.5 8.9  MG 2.4 2.4 2.3 2.2 2.1    GFR: Estimated Creatinine Clearance: 106.1 mL/min (by C-G formula based on SCr of 0.77 mg/dL).  Liver Function Tests: Recent Labs  Lab 12/19/20 0427 12/20/20 0422 12/21/20 0522 12/22/20 0433 12/23/20 0410  AST 35 29 39 35 29  ALT 15 15 18 18 15   ALKPHOS 799* 706* 684* 672* 631*  BILITOT 0.7 0.6 0.4 0.6 0.6  PROT 6.6 6.2* 6.1* 6.1* 6.1*  ALBUMIN 2.1* 2.0* 2.1* 2.1* 2.1*    CBG: Recent Labs  Lab 12/24/20 1718 12/24/20 2109 12/25/20 0738 12/25/20 1140  12/25/20 1618  GLUCAP 126* 140* 110* 122* 113*     Recent Results (from the past 240 hour(s))  Urine Culture     Status: Abnormal   Collection Time: 12/16/20  1:46 PM   Specimen: Urine, Catheterized  Result Value Ref Range Status   Specimen Description   Final    URINE, CATHETERIZED Performed at [facility], [place] [address] [place], [place] [address]    Special Requests   Final    NONE Performed at [facility], [place] [address] [place],  [address]    Culture 90,000 COLONIES/mL VIRIDANS STREPTOCOCCUS (Fernando Stoiber)  Final   Report Status 12/17/2020 FINAL  Final  Resp Panel by RT-PCR (Flu Mauro Arps&B, Covid) Nasopharyngeal Swab     Status: None   Collection Time: 12/16/20  6:55 PM   Specimen: Nasopharyngeal Swab; Nasopharyngeal(NP) swabs in vial transport medium  Result Value Ref Range Status   SARS Coronavirus 2 by RT PCR NEGATIVE NEGATIVE Final    Comment: (NOTE) SARS-CoV-2 target nucleic acids are NOT DETECTED.  The SARS-CoV-2 RNA is generally detectable in upper respiratory specimens during the acute phase of infection. The lowest concentration of SARS-CoV-2 viral copies this assay can detect is 138 copies/mL. Belia Febo negative result does not preclude SARS-Cov-2 infection and should not be used as the sole basis for treatment or other patient management decisions. Neala Miggins negative result may occur with  improper specimen collection/handling, submission of specimen other than nasopharyngeal swab, presence of viral mutation(s) within the areas targeted by this assay, and inadequate number of viral copies(<138 copies/mL). Chardai Gangemi negative result must be combined with clinical observations, patient history, and epidemiological information. The expected result is Negative.  Fact Sheet for Patients:  EntrepreneurPulse.com.au  Fact Sheet for Healthcare Providers:  IncredibleEmployment.be  This test is no t yet approved or cleared  by the [place] FDA and  has been authorized for detection and/or diagnosis of SARS-CoV-2 by FDA under an Emergency Use Authorization (EUA). This EUA will remain  in effect (meaning this test can be used) for the duration of the COVID-19 declaration under Section 564(b)(1) of the Act, 21 U.S.C.section 360bbb-3(b)(1), unless the authorization is terminated  or revoked sooner.       Influenza Deslyn Cavenaugh by PCR NEGATIVE NEGATIVE Final   Influenza B by PCR NEGATIVE NEGATIVE Final    Comment: (NOTE) The Xpert Xpress SARS-CoV-2/FLU/RSV plus assay is intended as an aid in the diagnosis of influenza from Nasopharyngeal swab specimens and should not be used as Jatoya Armbrister sole basis for  treatment. Nasal washings and aspirates are unacceptable for Xpert Xpress SARS-CoV-2/FLU/RSV testing.  Fact Sheet for Patients: EntrepreneurPulse.com.au  Fact Sheet for Healthcare Providers: IncredibleEmployment.be  This test is not yet approved or cleared by the [place] FDA and has been authorized for detection and/or diagnosis of SARS-CoV-2 by FDA under an Emergency Use Authorization (EUA). This EUA will remain in effect (meaning this test can be used) for the duration of the COVID-19 declaration under Section 564(b)(1) of the Act, 21 U.S.C. section 360bbb-3(b)(1), unless the authorization is terminated or revoked.  Performed at [facility], [place] [address] [place], [place] [address]   Culture, blood (routine x 2)     Status: None   Collection Time: 12/18/20  8:21 AM   Specimen: BLOOD  Result Value Ref Range Status   Specimen Description   Final    BLOOD Performed at [place] [address] [place], [place] [address]    Special Requests   Final    BOTTLES DRAWN AEROBIC ONLY Blood Culture adequate volume Performed at [place] [address], [place], [place] [address]    Culture   Final    NO GROWTH  5 DAYS Performed at [facility], [place] [address], [place], [place] [address]    Report Status 12/23/2020 FINAL  Final  Culture, blood (routine x 2)     Status: None   Collection Time: 12/18/20  8:23 AM   Specimen: BLOOD  Result Value Ref Range Status   Specimen Description   Final    BLOOD LEFT ANTECUBITAL Performed at [place] [address] [place], [place] [address]    Special Requests   Final    BOTTLES DRAWN AEROBIC AND ANAEROBIC Blood Culture adequate volume Performed at [place] [address] [place], [place] [address]    Culture   Final    NO GROWTH 5 DAYS Performed at [facility], [place] [address] [place], [place] [address]    Report Status 12/23/2020 FINAL  Final         Radiology Studies: No results found.      Scheduled Meds:  amLODipine  5 mg Oral Daily   carvedilol  6.25 mg Oral BID WC   enoxaparin (LOVENOX) injection  60 mg Subcutaneous Q24H   feeding supplement  237 mL Oral BID BM   gabapentin  100 mg Oral QHS   insulin aspart  0-9 Units Subcutaneous TID WC   multivitamin with minerals  1 tablet Oral Daily   oxyCODONE  40 mg Oral Q12H   pantoprazole  40 mg Oral BID   polyethylene glycol  17 g Oral Daily   senna-docusate  1 tablet Oral BID   Continuous Infusions:   LOS: 9 days    Time spent: over 30 min    Fayrene Helper, MD [facility]   To contact the attending provider between 7A-7P or the covering provider during after hours 7P-7A, please log into the web site [email or web address] and access using universal [place] password for that web site. If you do not have the password, please call the hospital operator.  12/25/2020, 7:29 PM

## 2020-12-25 NOTE — Assessment & Plan Note (Signed)
CEA 1864, CA 125 29 on admission

## 2020-12-25 NOTE — Assessment & Plan Note (Signed)
Noted on MRI pelvis Workup as noted above

## 2020-12-25 NOTE — Assessment & Plan Note (Signed)
Amlodipine, coreg, follow

## 2020-12-25 NOTE — Assessment & Plan Note (Signed)
SSI

## 2020-12-25 NOTE — Progress Notes (Signed)
HEMATOLOGY-ONCOLOGY PROGRESS NOTE  SUBJECTIVE: She reports feeling better.  Her pain is under good control with the current narcotic regimen.  She is working with her family to arrange for help at home.  PHYSICAL EXAMINATION:  Vitals:   12/24/20 2112 12/25/20 0531  BP: (!) 153/81 (!) 148/81  Pulse: 98 95  Resp: 18 18  Temp:    SpO2: 95% 96%   Filed Weights   12/16/20 1307 12/17/20 0841 12/22/20 0928  Weight: 279 lb 15.8 oz (127 kg) 279 lb 15.8 oz (127 kg) 279 lb 15.8 oz (127 kg)    Intake/Output from previous day: 11/15 0701 - 11/16 0700 In: 940 [P.O.:840; IV Piggyback:100] Out: 1050 [Urine:1050]  LUNGS: clear anteriorly, no respiratory distress HEART: regular rate & rhythm and no murmurs and no lower extremity edema ABDOMEN: Soft, tender with firmness in the left lower abdomen NEURO: alert & oriented x 3 with fluent speech, no focal motor/sensory deficits  LABORATORY DATA:  I have reviewed the data as listed CMP Latest Ref Rng & Units 12/25/2020 12/24/2020 12/23/2020  Glucose 70 - 99 mg/dL 130(H) 132(H) 119(H)  BUN 6 - 20 mg/dL 19 23(H) 21(H)  Creatinine 0.44 - 1.00 mg/dL 0.77 0.81 0.80  Sodium 135 - 145 mmol/L 132(L) 140 138  Potassium 3.5 - 5.1 mmol/L 3.8 4.1 3.8  Chloride 98 - 111 mmol/L 101 107 104  CO2 22 - 32 mmol/L _0 Calcium 8.9 - 10.3 mg/dL 8.9 9.5 9.4  Total Protein 6.5 - 8.1 g/dL - - 6.1(L)  Total Bilirubin 0.3 - 1.2 mg/dL - - 0.6  Alkaline Phos 38 - 126 U/L - - 631(H)  AST 15 - 41 U/L - - 29  ALT 0 - 44 U/L - - 15    Lab Results  Component Value Date   WBC 13.1 (H) 12/25/2020   HGB 8.3 (L) 12/25/2020   HCT 26.1 (L) 12/25/2020   MCV 95.3 12/25/2020   PLT 258 12/25/2020   NEUTROABS 9.8 (H) 12/25/2020    DG Chest 2 View  Result Date: 12/16/2020 CLINICAL DATA:  Shortness of breath in a 55 year old female. EXAM: CHEST - 2 VIEW COMPARISON:  July 10, 2009. FINDINGS: Linear opacities in the LEFT chest of progressed slightly since the previous  study. No lobar consolidation. Cardiomediastinal contours and hilar structures are stable. No sign of effusion. Mildly increased density in the subcarinal region on lateral projection. No acute skeletal process on limited assessment. IMPRESSION: Signs of scarring or atelectasis in the LEFT chest, slightly progressed since the previous study. Question developing subcarinal adenopathy, based on lateral projection. Developing lower lobe airspace disease is also a differential consideration. In a patient with pelvic masses on recent pelvic MRI would consider follow-up chest CT for further evaluation. Electronically Signed   By: Zetta Bills M.D.   On: 12/16/2020 14:13   CT CHEST W CONTRAST  Result Date: 12/17/2020 CLINICAL DATA:  Cancer of unknown primary.  Staging. EXAM: CT CHEST WITH CONTRAST TECHNIQUE: Multidetector CT imaging of the chest was performed during intravenous contrast administration. CONTRAST:  83m OMNIPAQUE IOHEXOL 350 MG/ML SOLN COMPARISON:  Chest CT dated 06/01/2007. FINDINGS: Cardiovascular: There is no cardiomegaly or pericardial effusion. Three-vessel coronary vascular calcification. Mild atherosclerotic calcification of the thoracic aorta. No aneurysmal dilatation. The origins of the great vessels of the aortic arch appear patent as visualized. There is dilatation of the main pulmonary trunk suggestive of pulmonary hypertension. Evaluation of the pulmonary arteries is limited due to respiratory motion artifact  and suboptimal opacification and timing of the contrast. No large or central pulmonary artery embolus identified. Mediastinum/Nodes: No hilar or mediastinal adenopathy. The esophagus is grossly unremarkable. Prior right hemithyroidectomy. No mediastinal fluid collection. Lungs/Pleura: Bibasilar subpleural atelectasis/scarring. There is no pleural effusion pneumothorax. The central airways are patent. Upper Abdomen: Indeterminate 3 cm left renal nodule, present on the prior CT, likely  a benign etiology such as adenoma. Musculoskeletal: Degenerative changes of the spine. No acute osseous pathology. IMPRESSION: 1. No acute intrathoracic pathology. No CT evidence of central pulmonary artery embolus. 2. Dilatation of the main pulmonary trunk suggestive of pulmonary hypertension. 3. Status post prior right hemithyroidectomy. 4. Bilateral linear atelectasis/scarring. 5. Aortic Atherosclerosis (ICD10-I70.0). Electronically Signed   By: Anner Crete M.D.   On: 12/17/2020 21:09   MR Lumbar Spine W Wo Contrast  Result Date: 12/16/2020 CLINICAL DATA:  Initial evaluation for low back pain, cancer suspected. EXAM: MRI LUMBAR SPINE WITHOUT AND WITH CONTRAST TECHNIQUE: Multiplanar and multiecho pulse sequences of the lumbar spine were obtained without and with intravenous contrast. CONTRAST:  59m GADAVIST GADOBUTROL 1 MMOL/ML IV SOLN COMPARISON:  Prior CT from earlier the same day. FINDINGS: Segmentation: Standard. Lowest well-formed disc space labeled the L5-S1 level. Alignment: 4 mm facet mediated anterolisthesis of L4 on L5. Alignment otherwise normal with preservation of the normal lumbar lordosis. Vertebrae: Diffusely abnormal appearance of the bone marrow is seen throughout the visualized lumbar spine and pelvis. Associated heterogeneous STIR hyperintensity with irregular heterogeneous postcontrast enhancement. Findings are highly suspicious for diffuse osseous metastatic disease. Involvement appears to be most pronounced within the T12 vertebral body as well as the visualized pelvis. Exact measurements of a discrete lesion is difficult given the overall infiltrative appearance of this finding. No associated pathologic fracture. No visible extra osseous extension of tumor at this time. Conus medullaris and cauda equina: Conus extends to the L1 level. Conus and cauda equina appear normal. No visible epidural or intracanalicular tumor. Paraspinal and other soft tissues: Mild edema within the  subcutaneous fat of the lower back, which could be related to overall volume status. Paraspinous soft tissues demonstrate no other acute finding. Retroperitoneal/iliac adenopathy noted. Left larger than right adnexal masses partially visualized. Findings better characterized on prior CT. Disc levels: L1-2:  Unremarkable. L2-3: Disc desiccation with mild disc bulge. Superimposed small right foraminal to extraforaminal disc protrusion (series 11, image 17). Mild facet hypertrophy. Underlying short pedicles with a degree of mild spinal stenosis. Mild bilateral L2 foraminal narrowing. L3-4: Disc desiccation with mild disc bulge. Superimposed shallow left extraforaminal disc protrusion with annular fissure (series 11, image 24). Mild facet hypertrophy. Underlying short pedicles. Mild spinal stenosis. Foramina remain patent. L4-5: 4 mm anterolisthesis. Disc desiccation with broad-based posterior pseudo disc bulge. Biforaminal annular fissures noted, left larger than right. Moderate bilateral facet arthrosis. Resultant moderate spinal stenosis. Mild to moderate bilateral L4 foraminal narrowing. L5-S1: Normal interspace. Mild right greater than left facet hypertrophy. No stenosis. IMPRESSION: 1. 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 No associated pathologic fracture or extra osseous extension of tumor at this time. 2. Retroperitoneal/iliac adenopathy with left larger than right adnexal masses, better characterized on prior CT. 3. Underlying multilevel degenerative spondylosis as above, most pronounced at L4-5 where there is resultant moderate spinal stenosis. Electronically Signed   By: BJeannine BogaM.D.   On: 12/16/2020 23:44   MR PELVIS W WO CONTRAST  Result Date: 12/05/2020 CLINICAL  DATA:  55 year old female with indeterminate hypoechoic left adnexal mass identified in the setting of left  lower quadrant pain. EXAM: MRI PELVIS WITHOUT AND WITH CONTRAST TECHNIQUE: Multiplanar multisequence MR imaging of the pelvis was performed both before and after administration of intravenous contrast. CONTRAST:  87m GADAVIST GADOBUTROL 1 MMOL/ML IV SOLN COMPARISON:  12/01/2020 pelvic sonogram and unenhanced CT abdomen/pelvis. FINDINGS: Urinary Tract: Mild diffuse bladder wall thickening. There is a poorly marginated enhancing solid 3.7 x 2.8 x 3.0 cm mass centered at the urethra (series 32/image 42 and series 30/image 67), extending from the vesicourethral junction throughout the length of the urethra. There appears to be an underlying right periurethral diverticulum (series 5/image 27). At the inferior tip of the urethra, there is a simple 0.9 x 0.9 x 0.9 cm cystic lesion compatible with a Skene's duct cyst. Bowel: Visualized small and large bowel are normal caliber with no bowel wall thickening. Vascular/Lymphatic: Mild bilateral inguinal lymphadenopathy measuring up to 1.4 cm short axis diameter on the right (series 19/image 71) and 1.4 cm on the left (series 19/image 70). Moderate bilateral external iliac lymphadenopathy measuring up to 1.7 cm bilaterally (series 19/image 53 on the right and 56 on the left). Bilateral common iliac lymphadenopathy, largest 1.6 cm on the right (series 19/image 27) and 1.7 cm on the left (series 19/image 14). Reproductive: Uterus: The anteverted uterus measures 8.2 x 3.7 x 5.5 cm. No uterine fibroids. Inner myometrium (junctional zone) measures 12 mm in thickness, which is compatible with mild diffuse uterine adenomyosis. Endometrium measures 4 mm in bilayer thickness, which is within normal limits. No endometrial cavity fluid or focal endometrial mass. Poorly marginated enhancing solid mass of the left uterine cervix measuring 2.4 x 2.3 x 3.0 cm (series 5/image 21 and series 8/image 22), which appears to disrupt the normal cervical fibrous stroma without frank parametrial  invasion. Ovaries and Adnexa: The right ovary measures 4.0 x 2.4 x 4.3 cm and contains multiple solid avidly enhancing masses, largest 2.3 x 2.0 cm (series 19/image 33). The left ovary measures 8.3 x 7.7 x 8.5 cm and is replaced by a predominantly solid avidly enhancing mass. Other: No abnormal free fluid in the pelvis. No focal pelvic fluid collection. Musculoskeletal: Diffuse patchy confluent nodular replacement of the pelvic osseous structures, best seen on axial series 7. IMPRESSION: 1. Poorly marginated enhancing 3.7 x 2.8 x 3.0 cm mass centered at the urethra, extending from the vesicourethral junction throughout the length of the female urethra, with probable underlying right periurethral diverticulum. This mass is concerning for malignancy. 2. Solid avidly enhancing bilateral ovarian masses, largest 2.3 cm on the right and 8.3 cm on the left, worrisome for bilateral ovarian metastases. 3. Poorly marginated enhancing 2.4 x 2.3 x 3.0 cm mass in the left uterine cervix, which appears to disrupt the normal cervical fibrous stroma without frank parametrial invasion, suspicious for malignancy. 4. Mild-to-moderate bilateral common iliac, bilateral external iliac and bilateral inguinal lymphadenopathy, suspicious for metastatic disease. 5. Diffuse patchy confluent nodular replacement of the pelvic osseous structures, suspicious for osseous metastatic disease. 6. Mild diffuse uterine adenomyosis. Electronically Signed   By: JIlona SorrelM.D.   On: 12/05/2020 20:31   [place]  Result Date: 12/16/2020 CLINICAL DATA:  Pelvic pain EXAM: TRANSABDOMINAL ULTRASOUND OF PELVIS DOPPLER ULTRASOUND OF OVARIES TECHNIQUE: Transabdominal ultrasound examination of the pelvis was performed including evaluation of the uterus, ovaries, adnexal regions, and pelvic cul-de-sac. Color and duplex Doppler ultrasound was utilized to evaluate blood flow to  the ovaries. COMPARISON:  CT 12/16/2020, MRI 12/05/2020, pelvic ultrasound  12/01/2020 FINDINGS: Uterus Measurements: 6.8 x 3.4 x 5 cm = volume: 60.5 mL. No fibroids or other mass visualized. Endometrium Poorly visible, unable to measure. Right ovary Measurements: 4.8 x 2.9 x 5.5 cm = volume: 38.6 mL. Solid right adnexal mass replaces most of right ovary. Left ovary Measurements: 10.5 x 8.7 x 9.2 cm = volume: 440 mL. Enlarged left ovary with heterogenous solid mass that replaces the left ovary. Previously the left ovary measured 8.3 x 7.7 x 8.5 cm on MRI. Pulsed Doppler evaluation demonstrates normal arterial and venous waveforms in both or adnexa, though note that both ovaries are essentially replaced by mass lesions. Other: No significant ascites. Foley catheter within the bladder which appears somewhat thick walled. IMPRESSION: 1. Very limited exam secondary to habitus and only trans abdominal technique 2. Bilateral solid adnexal masses essentially replacing the ovaries which thereby limits assessment of the ovaries. Left adnexal solid mass lesion has significantly increased in size; documented flow in the adnexa relates to flow within the solid adnexal masses. Gynecology follow-up recommended. Electronically Signed   By: Donavan Foil M.D.   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 EXAM: CT ABDOMEN AND PELVIS WITH CONTRAST TECHNIQUE: Multidetector CT imaging of the abdomen and pelvis was performed using the standard protocol following bolus administration of intravenous contrast. CONTRAST:  7m OMNIPAQUE IOHEXOL 350 MG/ML SOLN COMPARISON:  Comparison is made with multiple prior studies which were performed recently, most recent comparison is an MRI of the pelvis of December 05, 2020. FINDINGS: Lower chest: Basilar atelectasis. Subcarinal region not well evaluated. No consolidation or effusion at the lung bases. Hepatobiliary:  No focal, suspicious hepatic lesion. No pericholecystic stranding. Portal vein is patent. No biliary duct dilation. Pancreas: Pancreas with normal contours. No signs of adjacent inflammation. Spleen: Spleen normal size and contour. Adrenals/Urinary Tract: Well-circumscribed LEFT adrenal lesion, density value of 19 Hounsfield units on previous noncontrast imaging measuring 3.2 x 2.6 cm., relative washout is indeterminate at 30%. Normal RIGHT adrenal. Normal, symmetric enhancement of bilateral kidneys without focal renal lesion. Stomach/Bowel: Small hiatal hernia. No stranding adjacent to the stomach. No sign of small bowel obstruction or acute small bowel process. The appendix is normal. Stool in various parts of the colon without signs of obstruction or adjacent stranding. Vascular/Lymphatic: Retroperitoneal adenopathy (image 50/2) 14 mm lymph node previously 11 mm along the LEFT common iliac chain. LEFT pelvic sidewall lymph node (image 75/2) 14 mm short axis, previously 13 mm. Similar size of RIGHT pelvic sidewall/external iliac lymph nodes also with little change compared to the most recent comparison imaging. Reproductive: Foley catheter in situ. Uterus and cervix not well at assess nor are the masses within the area of the cervix and urethra seen on previous MRI. Increasing size of LEFT ovary with heterogeneous appearance now measuring 10 x 8.8 cm. When assessed on the study of October 20, 2020 the ovary for reference measured 3.7 x 2.5 cm. RIGHT ovary with area of enhancement shows enlargement as well but not as pronounced as on previous imaging with 2.8 cm area of enhancement in the RIGHT ovary as the largest discrete area which is similar to the recent MRI evaluation. As compared to the study of October 20, 2020 there is little change in the appearance of the RIGHT ovary aside  from this area though there is an increasingly nodular appearance of the ovary in this location. Mass posterior to the LEFT ovary  just above the uterus measuring 4.4 x 2.8 cm has enlarged from 2.5 cm. Other: Small volume ascites. Musculoskeletal: Heterogeneous pattern associated with the spine in terms of density raises the question of metastatic disease without discrete measurable focal lesion in the spine. For example a geographic area of sclerosis on image 101/5) in the anterior L3 vertebral body does raise the question of underlying lesion with numerous additional foci, essentially nearly all levels of the spine with some degree of heterogeneity. Another example of this finding is noted on the RIGHT at the T10 level. No acute bone finding or frankly destructive bone process. IMPRESSION: Rapid increase in size of the LEFT ovary with some adjacent ascites. The rapid enlargement is seen in the setting of generalized worsening of masses associated with the LEFT and RIGHT ovary but perhaps more pronounced than other areas in the pelvis. The possibility of torsion associated with an ovarian mass could be considered based on this finding. Would correlate with any worsening abdominal pain and with sonogram as warranted for further assessment. Enlarging masses elsewhere though not to the extent that is seen with the LEFT ovary. Masslike area of the cervix and potentially within a urethral diverticulum not as well seen as on the recent MRI evaluation. Small volume ascites. Well-circumscribed LEFT adrenal lesion measuring 3.2 x 2.6 cm, density value of 19 Hounsfield units on previous noncontrast imaging. Relative washout is indeterminate at 30%. This may represent an adrenal adenoma. Consider dedicated adrenal protocol CT or attention on PET evaluation if performed. Heterogeneous pattern of subtle sclerosis and lucency in the spine on today's study raising the question of bony metastatic disease in the spine. Findings of increased ovarian size on the LEFT out of proportion other areas were called by telephone at the time of interpretation on  12/16/2020 at 5:44 pm to provider Theodis Blaze , who verbally acknowledged these results. Electronically Signed   By: Zetta Bills M.D.   On: 12/16/2020 17:46   [place] Art/Ven Flow Abd Pelv Doppler  Result Date: 12/16/2020 CLINICAL DATA:  Pelvic pain EXAM: TRANSABDOMINAL ULTRASOUND OF PELVIS DOPPLER ULTRASOUND OF OVARIES TECHNIQUE: Transabdominal ultrasound examination of the pelvis was performed including evaluation of the uterus, ovaries, adnexal regions, and pelvic cul-de-sac. Color and duplex Doppler ultrasound was utilized to evaluate blood flow to the ovaries. COMPARISON:  CT 12/16/2020, MRI 12/05/2020, pelvic ultrasound 12/01/2020 FINDINGS: Uterus Measurements: 6.8 x 3.4 x 5 cm = volume: 60.5 mL. No fibroids or other mass visualized. Endometrium Poorly visible, unable to measure. Right ovary Measurements: 4.8 x 2.9 x 5.5 cm = volume: 38.6 mL. Solid right adnexal mass replaces most of right ovary. Left ovary Measurements: 10.5 x 8.7 x 9.2 cm = volume: 440 mL. Enlarged left ovary with heterogenous solid mass that replaces the left ovary. Previously the left ovary measured 8.3 x 7.7 x 8.5 cm on MRI. Pulsed Doppler evaluation demonstrates normal arterial and venous waveforms in both or adnexa, though note that both ovaries are essentially replaced by mass lesions. Other: No significant ascites. Foley catheter within the bladder which appears somewhat thick walled. IMPRESSION: 1. Very limited exam secondary to habitus and only trans abdominal technique 2. Bilateral solid adnexal masses essentially replacing the ovaries which thereby limits assessment of the ovaries. Left adnexal solid mass lesion has significantly increased in size; documented flow in the adnexa relates to flow within the  solid adnexal masses. Gynecology follow-up recommended. Electronically Signed   By: Donavan Foil M.D.   On: 12/16/2020 19:23   CT Renal Stone Study  Result Date: 12/01/2020 CLINICAL DATA:  Flank pain.  Evaluate for kidney  stone. EXAM: CT ABDOMEN AND PELVIS WITHOUT CONTRAST TECHNIQUE: Multidetector CT imaging of the abdomen and pelvis was performed following the standard protocol without IV contrast. COMPARISON:  10/20/2020 FINDINGS: Lower chest: Subsegmental atelectasis versus scar is identified within both lung bases. Hepatobiliary: Mild hepatic steatosis. No focal liver abnormality identified. Gallbladder unremarkable. Pancreas: Unremarkable. No pancreatic ductal dilatation or surrounding inflammatory changes. Spleen: Normal in size without focal abnormality. Right adrenal gland appears normal. Left adrenal mass measures 3.3 x 2.5 cm. Unchanged from 06/02/2016 compatible with a benign adrenal adenoma. Adrenals/Urinary Tract: No kidney stones are identified bilaterally. No signs of hydronephrosis or hydroureter. Mild circumferential wall thickening of the urinary bladder is identified. Stomach/Bowel: Stomach appears normal. The appendix is visualized and appears normal. Moderate stool burden is identified within the colon up to the level of the rectum. No bowel wall thickening, inflammation, or distension. Vascular/Lymphatic: Aortic atherosclerosis.  No aneurysm. Signs of retroperitoneal and pelvic adenopathy, including: 1.1 cm left periaortic node, image 52/2. Increased from 1 cm previously. Left external iliac lymph node measures 1.1 cm, image 71/2. previously 7 mm. Right pelvic sidewall lymph node measures 1.1 cm. This is unchanged from previous exam. Within the right inguinal region there is a 1.3 cm lymph node, image 85/2. Previously 0.9 cm. Left inguinal region there is a 1.4 cm node, image 84/2. Formally 1 cm. Reproductive: There is increased soft tissue fullness in the region of the cervix which appears unchanged from 10/20/2020. This measures 4.9 x 3.5 cm, image 76/2. Marked interval enlargement of left ovary. The left ovary has a masslike appearance measuring 8.4 x 6.6 cm, image 66/2. Formally this measured 4.5 x 2.7 cm.  The right ovary measures 4.0 by 3.6 cm, image 66/2. Formally 4.6 by 2.7 cm. Other: No ascites or focal fluid collections. No signs of peritoneal nodularity. Small periumbilical hernia contains fat only. Supraumbilical fat containing hernia measures 3.3 cm, image 67/6. Infraumbilical hernia also contains fat only measuring 3.4 cm, image 70/6. Fat containing left inguinal hernia noted, small. Musculoskeletal: No acute or suspicious osseous findings. IMPRESSION: 1. No urinary tract calculi identified. 2. Marked interval enlargement of left ovary with masslike appearance. This is new finding compared with 10/20/2020. Given the short interval findings are favored to represent an acute process such as a hemorrhagic cyst. Tubo-ovarian abscess is less favored but not entirely excluded. Malignancy is also a differential consideration but considered less likely given the short interval from 10/20/2020. Initial workup with transabdominal/transvaginal pelvic sonogram is recommended. If the pelvic ultrasound is inconclusive then contrast enhanced pelvic MRI may be considered when the patient is clinically stable, is able to follow instructions, and remain motionless. Ideally this should be performed on a nonemergent basis. 3. Soft tissue fullness in the region of the cervix is again noted and appears similar to previous exam. Etiology is indeterminate. Advise correlation with cervical cancer screening. 4. Retroperitoneal and pelvic adenopathy identified. In the setting of infection this may reflect reactive adenopathy. Differential considerations include metastatic disease as well as lymphoproliferative disorders. 5. Multiple fat containing ventral abdominal wall and left inguinal hernias. 6. Hepatic steatosis. 7. Stable left adrenal adenoma. 8. Aortic Atherosclerosis (ICD10-I70.0). Electronically Signed   By: Kerby Moors M.D.   On: 12/01/2020 06:43   US PELVIC COMPLETE  W TRANSVAGINAL AND TORSION R/O  Result Date:  12/01/2020 CLINICAL DATA:  LLQ Pain EXAM: TRANSABDOMINAL AND TRANSVAGINAL ULTRASOUND OF PELVIS TECHNIQUE: Both transabdominal and transvaginal ultrasound examinations of the pelvis were performed. Transabdominal technique was performed for global imaging of the pelvis including uterus, ovaries, adnexal regions, and pelvic cul-de-sac. It was necessary to proceed with endovaginal exam following the transabdominal exam to visualize the endometrium and uterus. COMPARISON:  December 01, 2020, September eleventh 2022 FINDINGS: Uterus Uterus is only visualized transvaginally and is suboptimally assessed due to patient incomplete voiding and body habitus. Measurements: 8.8 x 3.6 x 5.0 cm = volume: 84 mL. Bulkiness of the cervix. Endometrium Poorly visualized due to distension of the bladder on transvaginal imaging. Right ovary Not visualized transabdominally or transvaginally. Left adnexal mass: Adnexal mass is labeled as uterus on transabdominal imaging. It measures 9.5 x 6.9 x 7.5 cm for a volume of 260 ML. It is predominately hypoechoic. There are internal anechoic spaces consistent with cystic density within the mass. This corresponds to the mass seen on CT. Doppler imaging was not performed of this mass, limiting evaluation for blood flow. Other findings No abnormal free fluid. IMPRESSION: 1. There is a hypoechoic LEFT adnexal mass which measures approximately 9.5 cm with central internal cystic density. Given newly conspicuous appearance since September 2022, this may reflect a hemorrhagic cyst or sequela of torsed ovary. Recommend GYN consult, and consideration of pelvis MRI w/o and w/ contrast if clinically warranted. Note: This recommendation does not apply to premenarchal patients or to those with increased risk (genetic, family history, elevated tumor markers or other high-risk factors) of ovarian cancer. Reference: Radiology 2019 Nov; 293(2):359-371. 2. Bulkiness of the cervix, suboptimally assessed with current  technique. Recommend correlation with physical exam. Electronically Signed   By: Valentino Saxon M.D.   On: 12/01/2020 10:22    ASSESSMENT AND PLAN: 1.  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 -12/05/2020 MRI of the pelvis-poorly marginated enhancing 3.7 x 2.8 x 3.0 cm mass centered at the urethra, solid avidly enhancing bilateral ovarian masses, poorly marginated enhancing 2.4 x 2.3 x 3.0 cm mass in the left uterine cervix, mild to moderate bilateral common iliac, bilateral external iliac, and bilateral inguinal lymphadenopathy, diffuse patchy confluent nodular replacement of the pelvic osseous structures. -12/13/2020 CEA 1864, CA125 29.2 -12/16/2020 CT abdomen/pelvis-rapidly increasing size of the left ovary with some adjacent ascites, enlarging masses elsewhere, masslike area of the cervix and potentially within a urethral diverticulum, well-circumscribed left adrenal lesion measuring 3.2 x 2.6 cm, heterogeneous pattern of subtle sclerosis and lucency in the spine. -12/16/2020 MRI of the lumbar spine-diffusely abnormal appearance of the bone marrow throughout the visualized lumbar spine and pelvis highly suspicious for diffuse osseous metastatic disease, retroperitoneal/iliac adenopathy with left larger than right adnexal masses. -12/17/2020 CT chest-no acute intrathoracic pathology -Upper endoscopy 12/22/2020-gastritis, gastric nodule biopsy-adenocarcinoma, poorly differentiated with signet ring morphology -Colonoscopy 12/22/2020-polyps removed from the ascending and sigmoid colon, extrinsic compression of the sigmoid colon-tubulovillous adenoma without high-grade dysplasia, tubular adenoma and hyperplastic polyps 2.  Abdominal pain secondary #1 3.  Constipation 4.  Normocytic anemia 5.  Leukocytosis 6.  Protein calorie malnutrition 7.  Obstructive sleep apnea 8.  Diabetes mellitus 9.  Hypertension 10.  Possible pulmonary hypertension noted on CT  chest  Ms. Weld appears improved with better pain control.  I discussed the biopsy pathology with her.  It appears she most likely has metastatic gastric cancer versus a primary GU/GYN primary with metastatic  disease involving the stomach.  She understands treatment will consist of systemic therapy.  We will arrange for an outpatient PET scan and await results from a gene array assay for further assistance with determining the primary tumor site.  We will submit the tumor for molecular testing, HER2, and PD-L1 testing.  The plan is to initiate systemic therapy as an outpatient.  I discussed the need for Port-A-Cath placement with Ms. Isham.  She agrees to proceed with Port-A-Cath placement.  Recommendations: 1.  IR consult for Port-A-Cath placement 2.  Will need outpatient PET scan. 3.  Continue oxycodone and OxyContin for pain 4.  Increase ambulation, work on disposition plan 5.  Outpatient follow-up will be scheduled the Cancer center  Future Appointments  Date Time Provider [place]  02/18/2021 10:45 AM [name], PA-C [facility]      LOS: 9 days   Betsy Coder, MD 12/25/20

## 2020-12-25 NOTE — Progress Notes (Signed)
Pt states she will call when ready for CPAP.

## 2020-12-25 NOTE — TOC Initial Note (Signed)
Transition of Care [facility]) - Initial/Assessment Note   Patient Details  Name: Erin [name] MRN: [id number] Date of Birth: 11-08-1965  [facility]) CM/SW Contact:    Erin Don, LCSW Phone Number: 12/25/2020, 2:22 PM  Clinical Narrative: CSW met with patient and patient requested CSW follow up with her niece, Erin [name]. CSW spoke with niece who informed CSW she is trying to become HCPOA for the patient. CSW placed consult for chaplain to assist with POA paperwork. TOC to follow.  Expected Discharge Plan: IP Rehab Facility Barriers to Discharge: Continued Medical Work up, Inadequate or no insurance  Patient Goals and CMS Choice Patient states their goals for this hospitalization and ongoing recovery are:: Have niece become HCPOA Choice offered to / list presented to : Patient  Expected Discharge Plan and Services Expected Discharge Plan: [place] In-house Referral: Clinical Social Work Post Acute Care Choice: IP Rehab Living arrangements for the past 2 months: Apartment          DME Arranged: N/A DME Agency: NA  Prior Living Arrangements/Services Living arrangements for the past 2 months: Apartment Patient language and need for interpreter reviewed:: Yes Need for Family Participation in Patient Care: Yes (Comment) Care giver support system in place?: Yes (comment) Criminal Activity/Legal Involvement Pertinent to Current Situation/Hospitalization: No - Comment as needed  Activities of Daily Living Home Assistive Devices/Equipment: Eyeglasses, CPAP (using golf club as a cane,) ADL Screening (condition at time of admission) Patient's cognitive ability adequate to safely complete daily activities?: Yes Is the patient deaf or have difficulty hearing?: No Does the patient have difficulty seeing, even when wearing glasses/contacts?: No Does the patient have difficulty concentrating, remembering, or making decisions?: No Patient able to express need for  assistance with ADLs?: Yes Does the patient have difficulty dressing or bathing?: Yes Independently performs ADLs?: No Communication: Independent Dressing (OT): Needs assistance Is this a change from baseline?: Change from baseline, expected to last >3 days Grooming: Independent Is this a change from baseline?: Change from baseline, expected to last >3 days Feeding: Needs assistance Is this a change from baseline?: Change from baseline, expected to last >3 days Bathing: Needs assistance Is this a change from baseline?: Change from baseline, expected to last >3 days Toileting: Needs assistance Is this a change from baseline?: Change from baseline, expected to last >3days In/Out Bed: Needs assistance Is this a change from baseline?: Change from baseline, expected to last >3 days Walks in Home: Dependent Is this a change from baseline?: Change from baseline, expected to last >3 days Does the patient have difficulty walking or climbing stairs?: Yes Weakness of Legs: Both Weakness of Arms/Hands: Both  Permission Sought/Granted Permission sought to share information with : Facility Art therapist granted to share info w AGENCY: [facility]  Emotional Assessment Appearance:: Appears stated age Attitude/Demeanor/Rapport: Engaged Affect (typically observed): Accepting Orientation: : Oriented to Self, Oriented to Place, Oriented to  Time, Oriented to Situation Alcohol / Substance Use: Not Applicable  Admission diagnosis:  Lower abdominal pain [R10.30] Abdominal pain [R10.9] Patient Active Problem List   Diagnosis Date Noted   Gastritis and gastroduodenitis    Adenocarcinoma ([place]) 12/20/2020   Metastatic adenocarcinoma of unknown origin ([place])    Malnutrition of moderate degree 12/18/2020   Elevated CEA 12/18/2020   Mass of urethra    Abdominal pain 12/16/2020   Ovarian mass 12/16/2020   Hypokalemia 12/16/2020   Tear of right rotator cuff 04/04/2020   Vitamin D  deficiency  04/02/2020   Other fatigue 03/13/2020   Shortness of breath on exertion 03/13/2020   OSA (obstructive sleep apnea) 11/25/2017   Obesity hypoventilation syndrome ([place]) 11/25/2017   Mallet finger of right hand 09/03/2017   Anterolisthesis 04/17/2017   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[place]) 04/17/2017   Degenerative disc disease, lumbar 04/17/2017   Class 3 severe obesity with serious comorbidity and body mass index (BMI) of 50.0 to 59.9 in adult [facility]) 06/01/2016   Diabetes mellitus ([place]) 05/27/2016   Essential hypertension 04/13/2016   Menorrhagia 08/26/2015    Class: Present on Admission   PCP:  Pcp, No Pharmacy:   [facility] [address] [place] [address] Phone: [phone] Fax: [phone]  Readmission Risk Interventions No flowsheet data found.

## 2020-12-25 NOTE — Assessment & Plan Note (Signed)
2/2 metastatic cancer Continue to adjust pain regimen as needed

## 2020-12-25 NOTE — Assessment & Plan Note (Addendum)
EGD with gastritis PPI x 6 weeks then 40 mg daily bx as noted above  Colonoscopy with 5 polyps removed (1 in ascending and 4 in sigmoid) bx as noted above

## 2020-12-25 NOTE — Assessment & Plan Note (Signed)
Replace and follow. ?

## 2020-12-26 ENCOUNTER — Other Ambulatory Visit (HOSPITAL_COMMUNITY): Payer: Self-pay

## 2020-12-26 ENCOUNTER — Inpatient Hospital Stay (HOSPITAL_COMMUNITY): Payer: Self-pay

## 2020-12-26 HISTORY — PX: IR IMAGING GUIDED PORT INSERTION: IMG5740

## 2020-12-26 LAB — CBC WITH DIFFERENTIAL/PLATELET
Abs Immature Granulocytes: 0.98 10*3/uL — ABNORMAL HIGH (ref 0.00–0.07)
Basophils Absolute: 0.1 10*3/uL (ref 0.0–0.1)
Basophils Relative: 1 %
Eosinophils Absolute: 0.1 10*3/uL (ref 0.0–0.5)
Eosinophils Relative: 0 %
HCT: 25.6 % — ABNORMAL LOW (ref 36.0–46.0)
Hemoglobin: 8 g/dL — ABNORMAL LOW (ref 12.0–15.0)
Immature Granulocytes: 8 %
Lymphocytes Relative: 11 %
Lymphs Abs: 1.4 10*3/uL (ref 0.7–4.0)
MCH: 30 pg (ref 26.0–34.0)
MCHC: 31.3 g/dL (ref 30.0–36.0)
MCV: 95.9 fL (ref 80.0–100.0)
Monocytes Absolute: 0.8 10*3/uL (ref 0.1–1.0)
Monocytes Relative: 6 %
Neutro Abs: 9.7 10*3/uL — ABNORMAL HIGH (ref 1.7–7.7)
Neutrophils Relative %: 74 %
Platelets: 250 10*3/uL (ref 150–400)
RBC: 2.67 MIL/uL — ABNORMAL LOW (ref 3.87–5.11)
RDW: 14.6 % (ref 11.5–15.5)
WBC: 13 10*3/uL — ABNORMAL HIGH (ref 4.0–10.5)
nRBC: 1.8 % — ABNORMAL HIGH (ref 0.0–0.2)

## 2020-12-26 LAB — GLUCOSE, CAPILLARY
Glucose-Capillary: 143 mg/dL — ABNORMAL HIGH (ref 70–99)
Glucose-Capillary: 112 mg/dL — ABNORMAL HIGH (ref 70–99)
Glucose-Capillary: 102 mg/dL — ABNORMAL HIGH (ref 70–99)
Glucose-Capillary: 137 mg/dL — ABNORMAL HIGH (ref 70–99)

## 2020-12-26 LAB — BASIC METABOLIC PANEL
Anion gap: 7 (ref 5–15)
BUN: 18 mg/dL (ref 6–20)
CO2: 23 mmol/L (ref 22–32)
Calcium: 9.4 mg/dL (ref 8.9–10.3)
Chloride: 105 mmol/L (ref 98–111)
Creatinine, Ser: 0.79 mg/dL (ref 0.44–1.00)
GFR, Estimated: >60 mL/min (ref >60)
Glucose, Bld: 130 mg/dL — ABNORMAL HIGH (ref 70–99)
Potassium: 3.9 mmol/L (ref 3.5–5.1)
Sodium: 135 mmol/L (ref 135–145)

## 2020-12-26 LAB — MAGNESIUM: Magnesium: 2.2 mg/dL (ref 1.7–2.4)

## 2020-12-26 LAB — PROTIME-INR
INR: 1.3 — ABNORMAL HIGH (ref 0.8–1.2)
Prothrombin Time: 16.5 seconds — ABNORMAL HIGH (ref 11.4–15.2)

## 2020-12-26 MED ORDER — MIDAZOLAM HCL 2 MG/2ML IJ SOLN
INTRAMUSCULAR | Status: AC | PRN
  Administered 2020-12-26 (×3): 1 mg via INTRAVENOUS

## 2020-12-26 MED ORDER — MIDAZOLAM HCL 2 MG/2ML IJ SOLN
INTRAMUSCULAR | Status: AC
  Filled 2020-12-26: qty 4

## 2020-12-26 MED ORDER — FENTANYL CITRATE (PF) 100 MCG/2ML IJ SOLN
INTRAMUSCULAR | Status: AC
  Filled 2020-12-26: qty 2

## 2020-12-26 MED ORDER — FENTANYL CITRATE (PF) 100 MCG/2ML IJ SOLN
INTRAMUSCULAR | Status: AC | PRN
  Administered 2020-12-26 (×2): 50 ug via INTRAVENOUS

## 2020-12-26 MED ORDER — LIDOCAINE-EPINEPHRINE (PF) 2 %-1:200000 IJ SOLN
INTRAMUSCULAR | Status: AC
  Filled 2020-12-26: qty 20

## 2020-12-26 NOTE — Progress Notes (Addendum)
PROGRESS NOTE    Erin [name]  BMW:[id number] DOB: Jun 02, 1965 DOA: 12/16/2020 PCP: Pcp, No   Chief Complaint  Patient presents with   Leg Pain   Abdominal Pain    Brief Narrative:  Ms. Tourville is Erin [name] 55 y.o. female with PMH OSA on CPAP, HTN, DMII, morbid obesity who presented with worsening abdominal pain and left thigh pain.    She has been undergoing workup for recently discovered cervical/urethral masses on CT in Sept 2022.  Further imaging showed ongoing interval enlargement of the left adnexal area.  There was also associated retroperitoneal and pelvic adenopathy.  MRI pelvis also performed on 12/05/2020 showed poorly enhancing mass centered at the urethra, solid enhancing bilateral ovarian masses, poorly enhancing mass involving left uterine cervix, and significant adenopathy involving bilateral common iliac, bilateral external iliac, bilateral inguinal lymph nodes.  Also noted to have pelvic osseous lesions concerning for metastatic disease as well.   She was admitted for biopsy with urology and GYN oncology.  She was able to undergo cystoscopy with urethral and bladder biopsy on 12/17/2020.  She also underwent cervical biopsies following this procedure.  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[place])   Adenocarcinoma ([place])   Elevated CEA   Ovarian mass   Gastritis and gastroduodenitis   Abdominal pain   Malnutrition of moderate degree   Hypokalemia   OSA (obstructive sleep apnea)   Diabetes mellitus ([place])   Essential hypertension  * Mass of urethra - s/p MRI pelvis on 12/05/20   Notable for poorly marginated enhancing 3.7 x 2.8 x 3.0 cm mass centered at urethra concerning for malignancy -- solidly avidly enhancing bilateral ovarian masses, largest 2.3 cm on R and 8.3 cm on L concerning  for bilateral ovarian mets -- poorly marginated enhancing 2.4x2.3x3.0 cm mass in L uterine cervix, concerning for malignancy -- bilateral common iliac, bilateral external iliac, and bilateral inguinal LAD, concerning for metastatic disease -- diffuse patchy confluent nodular replacement of the pelvic osseus structures, concerning for osseus metastatic disease (see report)  -Biopsy taken during cystoscopy on 12/17/2020  - biopsy has returned on 11/11. 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  gastric nodule bx and notable for adenocarcinoma, poorly differentiated with signet ring morphology.  Focal invastion of muscularis mucosa.  Muscularis propia not present in bx.  Per path reports, this most likely represents primary source of metastatic carcinoma dx on bx of vagina on 11/8.   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   gastric nodule bx and notable for adenocarcinoma, poorly differentiated with signet ring morphology.  Focal invastion of muscularis mucosa.  Muscularis propia not present in bx.  Per path reports, this most likely represents primary source of metastatic carcinoma dx on bx of vagina on 11/8.  Outpatient PET study pending for outpatient  Appreciate oncology and gyn onc  Elevated CEA CEA 1864, CA 125 29 on admission  Gastritis and gastroduodenitis EGD with gastritis PPI x 6 weeks  then 40 mg daily bx as noted above  Colonoscopy with 5 polyps removed (1 in ascending and 4 in sigmoid) bx as noted above  Ovarian mass Noted on MRI pelvis Workup as noted above  Abdominal pain 2/2 metastatic cancer Continue to adjust pain regimen as needed  Acute lower UTI-resolved as of 12/24/2020 S/p ceftriaxone Culture with strep viridans resolved   Constipation-resolved as of 12/24/2020 resolved  Malnutrition of moderate degree - Moderate malnutrition in context of acute illness/injury, morbid obesity - Evaluated by dietitian, appreciate assistance - per RD: "Moderate Malnutrition related to acute illness as evidenced by percent weight loss, moderate muscle depletion, energy intake < 75% for > 7 days."  Hypokalemia Replace and follow  OSA (obstructive sleep apnea) CPAP  Diabetes mellitus ([place]) SSI  Essential hypertension Amlodipine, coreg, follow  Likely d/c to CIR with plan for oncology to follow up  DVT prophylaxis: lovenox Code Status: full Family Communication: niece at bedside Disposition:   Status is: Inpatient  Remains inpatient appropriate because: unsafe d/c plan       Consultants:  Oncology IR Urology GI Palliative care Gyn oncology  Procedures:  Diagnostic cystoscopy, bladder biopsy, urethral biopsy, 12/17/20 Cervical biopsies, 12/17/2020 EGD and colonoscopy 12/22/20 Port placement by IR 11/17  Antimicrobials: Anti-infectives (From admission, onward)    Start     Dose/Rate Route Frequency Ordered Stop   12/23/20 1000  cefTRIAXone (ROCEPHIN) 1 g in sodium chloride 0.9 % 100 mL IVPB        1 g 200 mL/hr over 30 Minutes Intravenous Every 24 hours 12/23/20 0746 12/24/20 1030   12/20/20 1000  cefTRIAXone (ROCEPHIN) 1 g in sodium chloride 0.9 % 100 mL IVPB        1 g 200 mL/hr over 30 Minutes Intravenous Every 24 hours 12/20/20 0752 12/22/20 1451   12/17/20 0930  ceFAZolin (ANCEF) 3 g in dextrose 5 % 50 mL IVPB  Status:   Discontinued        3 g 100 mL/hr over 30 Minutes Intravenous  Once 12/17/20 0925 12/17/20 0926   12/17/20 0915  ceFAZolin (ANCEF) IVPB 3g/100 mL premix        3 g 200 mL/hr over 30 Minutes Intravenous  Once 12/17/20 0914 12/17/20 0935       Subjective: C/o discomfort  Objective: Vitals:   12/26/20 1158 12/26/20 1223 12/26/20 1350 12/26/20 1605  BP: (!) 143/81 (!) 148/86 (!) 149/82 (!) 144/81  Pulse: 93 94 96 (!) 104  Resp: 18 18 20 18   Temp: 98.6 F (37 C) 98.3 F (36.8 C) 99.3 F (37.4 C) 99.1 F (37.3 C)  TempSrc: Axillary Oral Oral Oral  SpO2: 95% 92% 97% 98%  Weight:      Height:        Intake/Output Summary (Last 24 hours) at 12/26/2020 1811 Last data filed at 12/26/2020 1609 Gross per 24 hour  Intake 240 ml  Output 1525 ml  Net -1285 ml   Filed Weights   12/16/20 1307 12/17/20 0841 12/22/20 0928  Weight: 127 kg 127 kg 127 kg    Examination:  General exam: Appears calm and comfortable  Respiratory system: unlabored Cardiovascular system: RRR Gastrointestinal system: distended, tender Central nervous system: Alert and oriented. No focal neurological deficits. Extremities: no LEE Skin: No rashes, lesions or ulcers Psychiatry: Judgement and insight appear normal. Mood & affect appropriate.  Data Reviewed: I have personally reviewed following labs and imaging studies  CBC: Recent Labs  Lab 12/22/20 0433 12/23/20 0410 12/24/20 0412 12/25/20 0415 12/26/20 0405  WBC 13.6* 12.8* 13.2* 13.1* 13.0*  NEUTROABS 9.6* 8.9* 9.4* 9.8* 9.7*  HGB 8.5* 8.1* 8.1* 8.3* 8.0*  HCT 26.5* 25.7* 25.4* 26.1* 25.6*  MCV 94.0 96.3 93.7 95.3 95.9  PLT 261 270 284 258 702    Basic Metabolic Panel: Recent Labs  Lab 12/22/20 0433 12/23/20 0410 12/24/20 0412 12/25/20 0415 12/26/20 0405  NA 139 138 140 132* 135  K 3.6 3.8 4.1 3.8 3.9  CL 104 104 107 101 105  CO2 27 26 24 22 23   GLUCOSE 137* 119* 132* 130* 130*  BUN 24* 21* 23* 19 18  CREATININE 0.77 0.80  0.81 0.77 0.79  CALCIUM 9.3 9.4 9.5 8.9 9.4  MG 2.4 2.3 2.2 2.1 2.2    GFR: Estimated Creatinine Clearance: 106.1 mL/min (by C-G formula based on SCr of 0.79 mg/dL).  Liver Function Tests: Recent Labs  Lab 12/20/20 0422 12/21/20 0522 12/22/20 0433 12/23/20 0410  AST 29 39 35 29  ALT 15 18 18 15   ALKPHOS 706* 684* 672* 631*  BILITOT 0.6 0.4 0.6 0.6  PROT 6.2* 6.1* 6.1* 6.1*  ALBUMIN 2.0* 2.1* 2.1* 2.1*    CBG: Recent Labs  Lab 12/25/20 1618 12/25/20 2111 12/26/20 0725 12/26/20 1125 12/26/20 1626  GLUCAP 113* 104* 143* 112* 102*     Recent Results (from the past 240 hour(s))  Resp Panel by RT-PCR (Flu Stanly Si&B, Covid) Nasopharyngeal Swab     Status: None   Collection Time: 12/16/20  6:55 PM   Specimen: Nasopharyngeal Swab; Nasopharyngeal(NP) swabs in vial transport medium  Result Value Ref Range Status   SARS Coronavirus 2 by RT PCR NEGATIVE NEGATIVE Final    Comment: (NOTE) SARS-CoV-2 target nucleic acids are NOT DETECTED.  The SARS-CoV-2 RNA is generally detectable in upper respiratory specimens during the acute phase of infection. The lowest concentration of SARS-CoV-2 viral copies this assay can detect is 138 copies/mL. Joell Usman negative result does not preclude SARS-Cov-2 infection and should not be used as the sole basis for treatment or other patient management decisions. Jazari Ober negative result may occur with  improper specimen collection/handling, submission of specimen other than nasopharyngeal swab, presence of viral mutation(s) within the areas targeted by this assay, and inadequate number of viral copies(<138 copies/mL). Braheem Tomasik negative result must be combined with clinical observations, patient history, and epidemiological information. The expected result is Negative.  Fact Sheet for Patients:  EntrepreneurPulse.com.au  Fact Sheet for Healthcare Providers:  IncredibleEmployment.be  This test is no t yet approved or cleared by the  [place] FDA and  has been authorized for detection and/or diagnosis of SARS-CoV-2 by FDA under an Emergency Use Authorization (EUA). This EUA will remain  in effect (meaning this test can be used) for the duration of the COVID-19 declaration under Section 564(b)(1) of the Act, 21 U.S.C.section 360bbb-3(b)(1), unless the authorization is terminated  or revoked sooner.       Influenza Dalila Arca by PCR NEGATIVE NEGATIVE Final   Influenza B by PCR NEGATIVE NEGATIVE Final    Comment: (NOTE) The Xpert Xpress SARS-CoV-2/FLU/RSV plus assay is intended as an aid in the diagnosis of influenza from Nasopharyngeal swab specimens and should not be used as Erlin Gardella sole basis for treatment. Nasal washings and aspirates are unacceptable for Xpert Xpress SARS-CoV-2/FLU/RSV testing.  Fact Sheet for Patients: EntrepreneurPulse.com.au  Fact Sheet for Healthcare  Providers: IncredibleEmployment.be  This test is not yet approved or cleared by the [place] and has been authorized for detection and/or diagnosis of SARS-CoV-2 by FDA under an Emergency Use Authorization (EUA). This EUA will remain in effect (meaning this test can be used) for the duration of the COVID-19 declaration under Section 564(b)(1) of the Act, 21 U.S.C. section 360bbb-3(b)(1), unless the authorization is terminated or revoked.  Performed at [facility], [place] [address], [place], [place] [address]   Culture, blood (routine x 2)     Status: None   Collection Time: 12/18/20  8:21 AM   Specimen: BLOOD  Result Value Ref Range Status   Specimen Description   Final    BLOOD Performed at [facility] [address] [place], [place] [address]    Special Requests   Final    BOTTLES DRAWN AEROBIC ONLY Blood Culture adequate volume Performed at [facility] [address], [place], [place] [address]    Culture   Final    NO GROWTH 5  DAYS Performed at [facility], [place] [address] [place], [place] [address]    Report Status 12/23/2020 FINAL  Final  Culture, blood (routine x 2)     Status: None   Collection Time: 12/18/20  8:23 AM   Specimen: BLOOD  Result Value Ref Range Status   Specimen Description   Final    BLOOD LEFT ANTECUBITAL Performed at [facility] [address], [place], [place] [address]    Special Requests   Final    BOTTLES DRAWN AEROBIC AND ANAEROBIC Blood Culture adequate volume Performed at [facility] [address], [place], [place] [address]    Culture   Final    NO GROWTH 5 DAYS Performed at [facility], [place] [address] [place], [place] [address]    Report Status 12/23/2020 FINAL  Final         Radiology Studies: IR IMAGING GUIDED PORT INSERTION  Result Date: 12/26/2020 INDICATION: 55 year old female with history of poorly differentiated adenocarcinoma of the urethra requiring central venous access for chemotherapy. EXAM: IMPLANTED PORT Lailoni Baquera CATH PLACEMENT WITH ULTRASOUND AND FLUOROSCOPIC GUIDANCE COMPARISON:  None. MEDICATIONS: None. ANESTHESIA/SEDATION: Moderate (conscious) sedation was employed during this procedure. Liberta Gimpel total of Versed 3 mg and Fentanyl 100 mcg was administered intravenously. Moderate Sedation Time: 19 minutes. The patient's level of consciousness and vital signs were monitored continuously by radiology nursing throughout the procedure under my direct supervision. CONTRAST:  None FLUOROSCOPY TIME:  0 minutes, 12 seconds (5 mGy) COMPLICATIONS: None immediate. PROCEDURE: The procedure, risks, benefits, and alternatives were explained to the patient. Questions regarding the procedure were encouraged and answered. The patient understands and consents to the procedure. The right neck and chest were prepped with chlorhexidine in Jamont Mellin sterile fashion, and Deep Bonawitz sterile drape was applied covering the operative field. Maximum barrier  sterile technique with sterile gowns and gloves were used for the procedure. Mihaela Fajardo timeout was performed prior to the initiation of the procedure. Ultrasound was used to examine the jugular vein which was compressible and free of internal echoes. Kruti Horacek skin marker was used to demarcate the planned venotomy and port pocket incision sites. Local anesthesia was provided to these sites and the subcutaneous tunnel track with 1% lidocaine with 1:100,000 epinephrine. Isabel Freese small incision was created at the jugular access site and blunt dissection was performed of the subcutaneous tissues. Under ultrasound guidance, the jugular vein  was accessed with Pablo Mathurin 21 ga micropuncture needle and an 0.018" wire was inserted to the superior vena cava. Real-time ultrasound guidance was utilized for vascular access including the acquisition of Shandiin Eisenbeis permanent ultrasound image documenting patency of the accessed vessel. Sameria Morss 5 Fr micopuncture set was then used, through which Kylan Liberati 0.035" Rosen wire was passed under fluoroscopic guidance into the inferior vena cava. An 8 Fr dilator was then placed over the wire. Albertia Carvin subcutaneous port pocket was then created along the upper chest wall utilizing Jsoeph Podesta combination of sharp and blunt dissection. The pocket was irrigated with sterile saline, packed with gauze, and observed for hemorrhage. Ezinne Yogi single lumen "ISP" sized power injectable port was chosen for placement. The 8 Fr catheter was tunneled from the port pocket site to the venotomy incision. The port was placed in the pocket. The external catheter was trimmed to appropriate length. The dilator was exchanged for an 8 Fr peel-away sheath under fluoroscopic guidance. The catheter was then placed through the sheath and the sheath was removed. Final catheter positioning was confirmed and documented with Ilma Achee fluoroscopic spot radiograph. The port was accessed with Mattea Seger Huber needle, aspirated, and flushed with heparinized saline. The deep dermal layer of the port pocket incision was  closed with interrupted 3-0 Vicryl suture. The skin was opposed with Cleopha Indelicato running subcuticular 4-0 Monocryl suture. Dermabond was then placed over the port pocket and neck incisions. The patient tolerated the procedure well without immediate post procedural complication. FINDINGS: After catheter placement, the tip lies within the superior cavoatrial junction. The catheter aspirates and flushes normally and is ready for immediate use. IMPRESSION: Successful placement of Natavia Sublette power injectable Port-Rosaura Bolon-Cath via the right internal jugular vein. The catheter is ready for immediate use. Ruthann Cancer, MD [facility] Radiology Electronically Signed   By: Ruthann Cancer M.D.   On: 12/26/2020 11:08        Scheduled Meds:  acetaminophen  1,000 mg Oral Q8H   amLODipine  5 mg Oral Daily   carvedilol  6.25 mg Oral BID WC   enoxaparin (LOVENOX) injection  60 mg Subcutaneous Q24H   feeding supplement  237 mL Oral BID BM   fentaNYL       gabapentin  100 mg Oral QHS   insulin aspart  0-9 Units Subcutaneous TID WC   lidocaine-EPINEPHrine       midazolam       multivitamin with minerals  1 tablet Oral Daily   oxyCODONE  40 mg Oral Q12H   pantoprazole  40 mg Oral BID   polyethylene glycol  17 g Oral Daily   senna-docusate  1 tablet Oral BID   Continuous Infusions:   LOS: 10 days    Time spent: over 30 min    Fayrene Helper, MD [facility]   To contact the attending provider between 7A-7P or the covering provider during after hours 7P-7A, please log into the web site [email or web address] and access using universal [place] password for that web site. If you do not have the password, please call the hospital operator.  12/26/2020, 6:11 PM

## 2020-12-26 NOTE — Progress Notes (Signed)
Chaplain tried to engage in an initial meeting with Erin [name] but she was sleeping at time of arrival.  Nurse Tech informed Chaplain that Erin [name] had just gotten back from a procedure.  Chaplain called and checked in with Erin [name] about Healthcare POA documents and let her know to have her paged if Erin [name] comes to a place of being alert later today during her visit.    Chaplain offered support.     12/26/20 1100  Clinical Encounter Type  Visited With Patient not available  Visit Type Social support;Initial

## 2020-12-26 NOTE — Progress Notes (Signed)
Inpatient Rehab Admissions Coordinator:   Note port placed this morning. I have no beds available for this patient to admit to CIR today.  Will continue to follow for timing of potential admission pending bed availability.   Shann Medal, PT, DPT Admissions Coordinator [phone] 12/26/20  11:41 AM

## 2020-12-26 NOTE — Procedures (Signed)
Interventional Radiology Procedure Note  Procedure: Single Lumen Power Port Placement    Access:  Right internal jugular vein  Findings: Catheter tip positioned at cavoatrial junction. Port is ready for immediate use, left accessed.   Complications: None  EBL: < 10 mL  Recommendations:  - Ok to shower in 24 hours - Do not submerge for 7 days - Routine line care    Ruthann Cancer, MD Pager: [phone]

## 2020-12-26 NOTE — Plan of Care (Signed)

## 2020-12-27 DIAGNOSIS — C169 Malignant neoplasm of stomach, unspecified: Secondary | ICD-10-CM

## 2020-12-27 DIAGNOSIS — G893 Neoplasm related pain (acute) (chronic): Secondary | ICD-10-CM

## 2020-12-27 LAB — BASIC METABOLIC PANEL
Anion gap: 8 (ref 5–15)
BUN: 16 mg/dL (ref 6–20)
CO2: 22 mmol/L (ref 22–32)
Calcium: 9.2 mg/dL (ref 8.9–10.3)
Chloride: 106 mmol/L (ref 98–111)
Creatinine, Ser: 0.77 mg/dL (ref 0.44–1.00)
GFR, Estimated: >60 mL/min (ref >60)
Glucose, Bld: 123 mg/dL — ABNORMAL HIGH (ref 70–99)
Potassium: 4 mmol/L (ref 3.5–5.1)
Sodium: 136 mmol/L (ref 135–145)

## 2020-12-27 LAB — CBC WITH DIFFERENTIAL/PLATELET
Abs Immature Granulocytes: 0.83 10*3/uL — ABNORMAL HIGH (ref 0.00–0.07)
Basophils Absolute: 0.1 10*3/uL (ref 0.0–0.1)
Basophils Relative: 0 %
Eosinophils Absolute: 0 10*3/uL (ref 0.0–0.5)
Eosinophils Relative: 0 %
HCT: 25 % — ABNORMAL LOW (ref 36.0–46.0)
Hemoglobin: 7.7 g/dL — ABNORMAL LOW (ref 12.0–15.0)
Immature Granulocytes: 7 %
Lymphocytes Relative: 12 %
Lymphs Abs: 1.4 10*3/uL (ref 0.7–4.0)
MCH: 29.4 pg (ref 26.0–34.0)
MCHC: 30.8 g/dL (ref 30.0–36.0)
MCV: 95.4 fL (ref 80.0–100.0)
Monocytes Absolute: 0.8 10*3/uL (ref 0.1–1.0)
Monocytes Relative: 7 %
Neutro Abs: 8.7 10*3/uL — ABNORMAL HIGH (ref 1.7–7.7)
Neutrophils Relative %: 74 %
Platelets: 245 10*3/uL (ref 150–400)
RBC: 2.62 MIL/uL — ABNORMAL LOW (ref 3.87–5.11)
RDW: 14.6 % (ref 11.5–15.5)
WBC: 11.8 10*3/uL — ABNORMAL HIGH (ref 4.0–10.5)
nRBC: 1.8 % — ABNORMAL HIGH (ref 0.0–0.2)

## 2020-12-27 LAB — GLUCOSE, CAPILLARY
Glucose-Capillary: 144 mg/dL — ABNORMAL HIGH (ref 70–99)
Glucose-Capillary: 126 mg/dL — ABNORMAL HIGH (ref 70–99)
Glucose-Capillary: 166 mg/dL — ABNORMAL HIGH (ref 70–99)

## 2020-12-27 LAB — MAGNESIUM: Magnesium: 2.3 mg/dL (ref 1.7–2.4)

## 2020-12-27 MED ORDER — PROCHLORPERAZINE MALEATE 10 MG PO TABS
10.0000 mg | ORAL_TABLET | Freq: Four times a day (QID) | ORAL | Status: DC | PRN
  Administered 2021-01-12: 22:00:00 10 mg via ORAL
  Filled 2020-12-27 (×2): qty 1

## 2020-12-27 MED ORDER — LEUCOVORIN CALCIUM INJECTION 350 MG
300.0000 mg/m2 | Freq: Once | INTRAVENOUS | Status: AC
  Administered 2020-12-27: 718 mg via INTRAVENOUS
  Filled 2020-12-27: qty 35.9

## 2020-12-27 MED ORDER — PALONOSETRON HCL INJECTION 0.25 MG/5ML
0.2500 mg | Freq: Once | INTRAVENOUS | Status: AC
  Administered 2020-12-27: 0.25 mg via INTRAVENOUS
  Filled 2020-12-27: qty 5

## 2020-12-27 MED ORDER — SODIUM CHLORIDE 0.9 % IV SOLN
10.0000 mg | Freq: Once | INTRAVENOUS | Status: AC
  Administered 2020-12-27: 10 mg via INTRAVENOUS
  Filled 2020-12-27: qty 1

## 2020-12-27 MED ORDER — FLUOROURACIL CHEMO INJECTION 2.5 GM/50ML
300.0000 mg/m2 | Freq: Once | INTRAVENOUS | Status: AC
  Administered 2020-12-27: 700 mg via INTRAVENOUS
  Filled 2020-12-27: qty 14

## 2020-12-27 MED ORDER — FLUOROURACIL CHEMO INJECTION 5 GM/100ML
2000.0000 mg/m2 | Freq: Once | INTRAVENOUS | Status: AC
  Administered 2020-12-27: 4800 mg via INTRAVENOUS
  Filled 2020-12-27: qty 96

## 2020-12-27 MED ORDER — OXALIPLATIN CHEMO INJECTION 100 MG/20ML
84.0000 mg/m2 | Freq: Once | INTRAVENOUS | Status: AC
  Administered 2020-12-27: 200 mg via INTRAVENOUS
  Filled 2020-12-27: qty 40

## 2020-12-27 MED ORDER — CHLORHEXIDINE GLUCONATE CLOTH 2 % EX PADS
6.0000 | MEDICATED_PAD | Freq: Every day | CUTANEOUS | Status: DC
  Administered 2020-12-27 – 2021-01-14 (×19): 6 via TOPICAL

## 2020-12-27 MED ORDER — DEXTROSE 5 % IV SOLN: Freq: Once | INTRAVENOUS | Status: DC

## 2020-12-27 MED ORDER — DEXTROSE 5 % IV SOLN: Freq: Once | INTRAVENOUS | Status: AC

## 2020-12-27 MED ORDER — COLD PACK MISC ONCOLOGY
1.0000 | Freq: Once | Status: AC | PRN
  Filled 2020-12-27: qty 1

## 2020-12-27 MED ORDER — HOT PACK MISC ONCOLOGY
1.0000 | Freq: Once | Status: AC | PRN
  Filled 2020-12-27: qty 1

## 2020-12-27 NOTE — Progress Notes (Signed)
Inpatient Rehab Admissions Coordinator:   Discussed with primary team and plan for chemo infusion today.  We will plan to admit to CIR on Monday as long as bed remains available and pt remains stable.    Shann Medal, PT, DPT Admissions Coordinator [phone] 12/27/20  10:09 AM

## 2020-12-27 NOTE — Progress Notes (Signed)
PROGRESS NOTE    Erin [name]  XKG:[id number] DOB: January 04, 1966 DOA: 12/16/2020 PCP: Pcp, No   Chief Complaint  Patient presents with   Leg Pain   Abdominal Pain    Brief Narrative:  [name] is Erin [name] 55 y.o. female with PMH OSA on CPAP, HTN, DMII, morbid obesity who presented with worsening abdominal pain and left thigh pain.    She has been undergoing workup for recently discovered cervical/urethral masses on CT in Sept 2022.  Further imaging showed ongoing interval enlargement of the left adnexal area.  There was also associated retroperitoneal and pelvic adenopathy.  MRI pelvis also performed on 12/05/2020 showed poorly enhancing mass centered at the urethra, solid enhancing bilateral ovarian masses, poorly enhancing mass involving left uterine cervix, and significant adenopathy involving bilateral common iliac, bilateral external iliac, bilateral inguinal lymph nodes.  Also noted to have pelvic osseous lesions concerning for metastatic disease as well.   She was admitted for biopsy with urology and GYN oncology.  She was able to undergo cystoscopy with urethral and bladder biopsy on 12/17/2020.  She also underwent cervical biopsies following this procedure.  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[place])   Gastric cancer ([place])   Adenocarcinoma ([place])   Elevated CEA   Cancer related pain   Ovarian mass   Gastritis and gastroduodenitis   Abdominal pain   Malnutrition of moderate degree   Hypokalemia   OSA (obstructive sleep apnea)   Diabetes mellitus ([place])   Essential hypertension  * Mass of urethra - s/p MRI pelvis on 12/05/20   Notable for poorly marginated enhancing 3.7 x 2.8 x 3.0 cm mass centered at urethra concerning for malignancy -- solidly avidly enhancing bilateral ovarian masses,  largest 2.3 cm on R and 8.3 cm on L concerning for bilateral ovarian mets -- poorly marginated enhancing 2.4x2.3x3.0 cm mass in L uterine cervix, concerning for malignancy -- bilateral common iliac, bilateral external iliac, and bilateral inguinal LAD, concerning for metastatic disease -- diffuse patchy confluent nodular replacement of the pelvic osseus structures, concerning for osseus metastatic disease (see report)  -Biopsy taken during cystoscopy on 12/17/2020  - biopsy has returned on 11/11. 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  gastric nodule bx and notable for adenocarcinoma, poorly differentiated with signet ring morphology.  Focal invastion of muscularis mucosa.  Muscularis propia not present in bx.  Per path reports, this most likely represents primary source of metastatic carcinoma dx on bx of vagina on 11/8.   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   gastric nodule bx and notable for adenocarcinoma, poorly differentiated with signet ring morphology.  Focal invastion of muscularis mucosa.  Muscularis propia not present in bx.  Per path reports, this most likely  represents primary source of metastatic carcinoma dx on bx of vagina on 11/8.  Outpatient PET study pending for outpatient  Appreciate  oncology and gyn onc   Cancer related pain Continue current regimen, if issues with control, may need to discuss with palliative.  Complains of continued pain, but not actually getting prn breakthrough that frequently.  Continue to encourage use of prn meds as needed.  Elevated CEA CEA 1864, CA 125 29 on admission  Gastritis and gastroduodenitis EGD with gastritis PPI x 6 weeks then 40 mg daily bx as noted above  Colonoscopy with 5 polyps removed (1 in ascending and 4 in sigmoid) bx as noted above  Ovarian mass Noted on MRI pelvis Workup as noted above  Abdominal pain 2/2 metastatic cancer Continue to adjust pain regimen as needed  Acute lower UTI-resolved as of 12/24/2020 S/p ceftriaxone Culture with strep viridans resolved   Constipation-resolved as of 12/24/2020 resolved  Malnutrition of moderate degree - Moderate malnutrition in context of acute illness/injury, morbid obesity - Evaluated by dietitian, appreciate assistance - per RD: "Moderate Malnutrition related to acute illness as evidenced by percent weight loss, moderate muscle depletion, energy intake < 75% for > 7 days."  Hypokalemia Replace and follow  OSA (obstructive sleep apnea) CPAP  Diabetes mellitus ([place]) SSI  Essential hypertension Amlodipine, coreg, follow  Likely d/c to CIR with plan for oncology to follow up  DVT prophylaxis: lovenox Code Status: full Family Communication: niece at bedside Disposition:   Status is: Inpatient  Remains inpatient appropriate because: unsafe d/c plan       Consultants:  Oncology IR Urology GI Palliative care Gyn oncology  Procedures:  Diagnostic cystoscopy, bladder biopsy, urethral biopsy, 12/17/20 Cervical biopsies, 12/17/2020 EGD and colonoscopy 12/22/20 Port placement by IR 11/17  Antimicrobials: Anti-infectives (From admission, onward)    Start     Dose/Rate Route Frequency Ordered Stop   12/23/20 1000  cefTRIAXone (ROCEPHIN) 1 g in  sodium chloride 0.9 % 100 mL IVPB        1 g 200 mL/hr over 30 Minutes Intravenous Every 24 hours 12/23/20 0746 12/24/20 1030   12/20/20 1000  cefTRIAXone (ROCEPHIN) 1 g in sodium chloride 0.9 % 100 mL IVPB        1 g 200 mL/hr over 30 Minutes Intravenous Every 24 hours 12/20/20 0752 12/22/20 1451   12/17/20 0930  ceFAZolin (ANCEF) 3 g in dextrose 5 % 50 mL IVPB  Status:  Discontinued        3 g 100 mL/hr over 30 Minutes Intravenous  Once 12/17/20 0925 12/17/20 0926   12/17/20 0915  ceFAZolin (ANCEF) IVPB 3g/100 mL premix        3 g 200 mL/hr over 30 Minutes Intravenous  Once 12/17/20 0914 12/17/20 0935       Subjective: Continued abdominal discomfort  Objective: Vitals:   12/26/20 2119 12/27/20 0557 12/27/20 1406 12/27/20 1601  BP: (!) 142/75 (!) 149/81 (!) 150/99 (!) 166/94  Pulse: 100 (!) 106 100 (!) 102  Resp: 18 18 20 20   Temp: 98.9 F (37.2 C) 99.2 F (37.3 C) 98.1 F (36.7 C) 97.7 F (36.5 C)  TempSrc: Oral Oral Oral Oral  SpO2: 99% 95% 94% 94%  Weight:      Height:        Intake/Output Summary (Last 24 hours) at 12/27/2020 1934 Last data filed at 12/27/2020 1800 Gross per 24 hour  Intake 1956.05 ml  Output 1700 ml  Net 256.05 ml   Danley Danker  Weights   12/16/20 1307 12/17/20 0841 12/22/20 0928  Weight: 127 kg 127 kg 127 kg    Examination:  General: No acute distress. Cardiovascular: RRR Lungs: unlabored Abdomen: mild abdominal ttp Neurological: Alert and oriented 3. Moves all extremities 4 . Cranial nerves II through XII grossly intact. Skin: Warm and dry. No rashes or lesions. Extremities: No clubbing or cyanosis. No edema.   Data Reviewed: I have personally reviewed following labs and imaging studies  CBC: Recent Labs  Lab 12/23/20 0410 12/24/20 0412 12/25/20 0415 12/26/20 0405 12/27/20 0357  WBC 12.8* 13.2* 13.1* 13.0* 11.8*  NEUTROABS 8.9* 9.4* 9.8* 9.7* 8.7*  HGB 8.1* 8.1* 8.3* 8.0* 7.7*  HCT 25.7* 25.4* 26.1* 25.6* 25.0*  MCV 96.3  93.7 95.3 95.9 95.4  PLT 270 284 258 250 833    Basic Metabolic Panel: Recent Labs  Lab 12/23/20 0410 12/24/20 0412 12/25/20 0415 12/26/20 0405 12/27/20 0357  NA 138 140 132* 135 136  K 3.8 4.1 3.8 3.9 4.0  CL 104 107 101 105 106  CO2 26 24 22 23 22   GLUCOSE 119* 132* 130* 130* 123*  BUN 21* 23* 19 18 16   CREATININE 0.80 0.81 0.77 0.79 0.77  CALCIUM 9.4 9.5 8.9 9.4 9.2  MG 2.3 2.2 2.1 2.2 2.3    GFR: Estimated Creatinine Clearance: 106.1 mL/min (by C-G formula based on SCr of 0.77 mg/dL).  Liver Function Tests: Recent Labs  Lab 12/21/20 0522 12/22/20 0433 12/23/20 0410  AST 39 35 29  ALT 18 18 15   ALKPHOS 684* 672* 631*  BILITOT 0.4 0.6 0.6  PROT 6.1* 6.1* 6.1*  ALBUMIN 2.1* 2.1* 2.1*    CBG: Recent Labs  Lab 12/26/20 1125 12/26/20 1626 12/26/20 2116 12/27/20 0807 12/27/20 1124  GLUCAP 112* 102* 137* 144* 126*     Recent Results (from the past 240 hour(s))  Culture, blood (routine x 2)     Status: None   Collection Time: 12/18/20  8:21 AM   Specimen: BLOOD  Result Value Ref Range Status   Specimen Description   Final    BLOOD Performed at [facility], [place] [address], [place], [place] [address]    Special Requests   Final    BOTTLES DRAWN AEROBIC ONLY Blood Culture adequate volume Performed at [place] [address] [place], [place] [address]    Culture   Final    NO GROWTH 5 DAYS Performed at [facility], [place] [address] [place], [place] [address]    Report Status 12/23/2020 FINAL  Final  Culture, blood (routine x 2)     Status: None   Collection Time: 12/18/20  8:23 AM   Specimen: BLOOD  Result Value Ref Range Status   Specimen Description   Final    BLOOD LEFT ANTECUBITAL Performed at [place] [address] [place], [place] [address]    Special Requests   Final    BOTTLES DRAWN AEROBIC AND ANAEROBIC Blood Culture adequate volume Performed at [place] [address] [place], [place] [address]    Culture   Final    NO GROWTH 5 DAYS Performed at [facility], [place] [address] [place], [place] [address]    Report Status 12/23/2020 FINAL  Final         Radiology Studies: IR IMAGING GUIDED PORT INSERTION  Result Date: 12/26/2020 INDICATION: 55 year old female with history of poorly differentiated adenocarcinoma of the urethra requiring central  venous access for chemotherapy. EXAM: IMPLANTED PORT Daniesha Driver CATH PLACEMENT WITH ULTRASOUND AND FLUOROSCOPIC GUIDANCE COMPARISON:  None. MEDICATIONS: None. ANESTHESIA/SEDATION: Moderate (conscious) sedation was employed during this procedure. Onofre Gains total of Versed 3 mg and Fentanyl 100 mcg was administered intravenously. Moderate Sedation Time: 19 minutes. The patient's level of consciousness and vital signs were monitored continuously by radiology nursing throughout the procedure under my direct supervision. CONTRAST:  None FLUOROSCOPY TIME:  0 minutes, 12 seconds (5 mGy) COMPLICATIONS: None immediate. PROCEDURE: The procedure, risks, benefits, and alternatives were explained to the patient. Questions regarding the procedure were encouraged and answered. The patient understands and consents to the procedure. The right neck and chest were prepped with chlorhexidine in Demia Viera sterile fashion, and Mattheu Brodersen sterile drape was applied covering the operative field. Maximum barrier sterile technique with sterile gowns and gloves were used for the procedure. Niasha Devins timeout was performed prior to the initiation of the procedure. Ultrasound was used to examine the jugular vein which was compressible and free of internal echoes. Tomie Elko skin marker was used to demarcate the planned venotomy and port pocket incision sites. Local anesthesia was provided to these sites and the subcutaneous tunnel track with 1% lidocaine with 1:100,000 epinephrine. Rayyan Burley small incision was created at the jugular access site and blunt dissection was  performed of the subcutaneous tissues. Under ultrasound guidance, the jugular vein was accessed with Yolanda Huffstetler 21 ga micropuncture needle and an 0.018" wire was inserted to the superior vena cava. Real-time ultrasound guidance was utilized for vascular access including the acquisition of Shawntae Lowy permanent ultrasound image documenting patency of the accessed vessel. Shirell Struthers 5 Fr micopuncture set was then used, through which Cissy Galbreath 0.035" Rosen wire was passed under fluoroscopic guidance into the inferior vena cava. An 8 Fr dilator was then placed over the wire. Naftali Carchi subcutaneous port pocket was then created along the upper chest wall utilizing Daneli Butkiewicz combination of sharp and blunt dissection. The pocket was irrigated with sterile saline, packed with gauze, and observed for hemorrhage. Kristy Catoe single lumen "ISP" sized power injectable port was chosen for placement. The 8 Fr catheter was tunneled from the port pocket site to the venotomy incision. The port was placed in the pocket. The external catheter was trimmed to appropriate length. The dilator was exchanged for an 8 Fr peel-away sheath under fluoroscopic guidance. The catheter was then placed through the sheath and the sheath was removed. Final catheter positioning was confirmed and documented with Ferrel Simington fluoroscopic spot radiograph. The port was accessed with Lively Haberman Huber needle, aspirated, and flushed with heparinized saline. The deep dermal layer of the port pocket incision was closed with interrupted 3-0 Vicryl suture. The skin was opposed with Amandeep Hogston running subcuticular 4-0 Monocryl suture. Dermabond was then placed over the port pocket and neck incisions. The patient tolerated the procedure well without immediate post procedural complication. FINDINGS: After catheter placement, the tip lies within the superior cavoatrial junction. The catheter aspirates and flushes normally and is ready for immediate use. IMPRESSION: Successful placement of Mylinh Cragg power injectable Port-Ailana Cuadrado-Cath via the right internal jugular vein.  The catheter is ready for immediate use. Ruthann Cancer, MD [facility] Electronically Signed   By: Ruthann Cancer M.D.   On: 12/26/2020 11:08        Scheduled Meds:  acetaminophen  1,000 mg Oral Q8H   amLODipine  5 mg Oral Daily   carvedilol  6.25 mg Oral BID WC   Chlorhexidine Gluconate Cloth  6 each Topical Daily  enoxaparin (LOVENOX) injection  60 mg Subcutaneous Q24H   feeding supplement  237 mL Oral BID BM   FLUOROURACIL (ADRUCIL) CHEMO infusion For Inpatient Use  2,000 mg/m2 (Treatment Plan Recorded) Intravenous Once   gabapentin  100 mg Oral QHS   insulin aspart  0-9 Units Subcutaneous TID WC   oxyCODONE  40 mg Oral Q12H   pantoprazole  40 mg Oral BID   polyethylene glycol  17 g Oral Daily   senna-docusate  1 tablet Oral BID   Continuous Infusions:   LOS: 11 days    Time spent: over 30 min    Fayrene Helper, MD [facility]   To contact the attending provider between 7A-7P or the covering provider during after hours 7P-7A, please log into the web site [email or web address] and access using universal [place] password for that web site. If you do not have the password, please call the hospital operator.  12/27/2020, 7:34 PM

## 2020-12-27 NOTE — Progress Notes (Signed)
Patient was transferred in bed to 1621.

## 2020-12-27 NOTE — Assessment & Plan Note (Addendum)
Will increase short acting oxycodone Continue long acting 40 mg BID Will ask palliative to reassess, appreciate assistance  LE [place] for LE pain negative.  Trial flexeril.  Increase gabapentin.

## 2020-12-27 NOTE — Progress Notes (Addendum)
HEMATOLOGY-ONCOLOGY PROGRESS NOTE  SUBJECTIVE: Port-A-Cath was placed yesterday and tolerated procedure well.  Pain fairly well controlled at this time.  PHYSICAL EXAMINATION:  Vitals:   12/26/20 2119 12/27/20 0557  BP: (!) 142/75 (!) 149/81  Pulse: 100 (!) 106  Resp: 18 18  Temp: 98.9 F (37.2 C) 99.2 F (37.3 C)  SpO2: 99% 95%   Filed Weights   12/16/20 1307 12/17/20 0841 12/22/20 0928  Weight: 127 kg 127 kg 127 kg    Intake/Output from previous day: 11/17 0701 - 11/18 0700 In: 480 [P.O.:480] Out: 1525 [Urine:1525]  LUNGS: clear anteriorly, no respiratory distress HEART: regular rate & rhythm and no murmurs and no lower extremity edema ABDOMEN: Soft, tender with firmness in the left lower abdomen NEURO: alert & oriented x 3 with fluent speech, no focal motor/sensory deficits Vascular: No leg edema  LABORATORY DATA:  I have reviewed the data as listed CMP Latest Ref Rng & Units 12/27/2020 12/26/2020 12/25/2020  Glucose 70 - 99 mg/dL 123(H) 130(H) 130(H)  BUN 6 - 20 mg/dL _0 Creatinine 0.44 - 1.00 mg/dL 0.77 0.79 0.77  Sodium 135 - 145 mmol/L 136 135 132(L)  Potassium 3.5 - 5.1 mmol/L 4.0 3.9 3.8  Chloride 98 - 111 mmol/L 106 105 101  CO2 22 - 32 mmol/L _1 Calcium 8.9 - 10.3 mg/dL 9.2 9.4 8.9  Total Protein 6.5 - 8.1 g/dL - - -  Total Bilirubin 0.3 - 1.2 mg/dL - - -  Alkaline Phos 38 - 126 U/L - - -  AST 15 - 41 U/L - - -  ALT 0 - 44 U/L - - -    Lab Results  Component Value Date   WBC 11.8 (H) 12/27/2020   HGB 7.7 (L) 12/27/2020   HCT 25.0 (L) 12/27/2020   MCV 95.4 12/27/2020   PLT 245 12/27/2020   NEUTROABS 8.7 (H) 12/27/2020    DG Chest 2 View  Result Date: 12/16/2020 CLINICAL DATA:  Shortness of breath in a 55 year old female. EXAM: CHEST - 2 VIEW COMPARISON:  July 10, 2009. FINDINGS: Linear opacities in the LEFT chest of progressed slightly since the previous study. No lobar consolidation. Cardiomediastinal contours and hilar structures  are stable. No sign of effusion. Mildly increased density in the subcarinal region on lateral projection. No acute skeletal process on limited assessment. IMPRESSION: Signs of scarring or atelectasis in the LEFT chest, slightly progressed since the previous study. Question developing subcarinal adenopathy, based on lateral projection. Developing lower lobe airspace disease is also a differential consideration. In a patient with pelvic masses on recent pelvic MRI would consider follow-up chest CT for further evaluation. Electronically Signed   By: Zetta Bills M.D.   On: 12/16/2020 14:13   CT CHEST W CONTRAST  Result Date: 12/17/2020 CLINICAL DATA:  Cancer of unknown primary.  Staging. EXAM: CT CHEST WITH CONTRAST TECHNIQUE: Multidetector CT imaging of the chest was performed during intravenous contrast administration. CONTRAST:  57m OMNIPAQUE IOHEXOL 350 MG/ML SOLN COMPARISON:  Chest CT dated 06/01/2007. FINDINGS: Cardiovascular: There is no cardiomegaly or pericardial effusion. Three-vessel coronary vascular calcification. Mild atherosclerotic calcification of the thoracic aorta. No aneurysmal dilatation. The origins of the great vessels of the aortic arch appear patent as visualized. There is dilatation of the main pulmonary trunk suggestive of pulmonary hypertension. Evaluation of the pulmonary arteries is limited due to respiratory motion artifact and suboptimal opacification and timing of the contrast. No large or central pulmonary artery embolus identified.  Mediastinum/Nodes: No hilar or mediastinal adenopathy. The esophagus is grossly unremarkable. Prior right hemithyroidectomy. No mediastinal fluid collection. Lungs/Pleura: Bibasilar subpleural atelectasis/scarring. There is no pleural effusion pneumothorax. The central airways are patent. Upper Abdomen: Indeterminate 3 cm left renal nodule, present on the prior CT, likely a benign etiology such as adenoma. Musculoskeletal: Degenerative changes of the  spine. No acute osseous pathology. IMPRESSION: 1. No acute intrathoracic pathology. No CT evidence of central pulmonary artery embolus. 2. Dilatation of the main pulmonary trunk suggestive of pulmonary hypertension. 3. Status post prior right hemithyroidectomy. 4. Bilateral linear atelectasis/scarring. 5. Aortic Atherosclerosis (ICD10-I70.0). Electronically Signed   By: Anner Crete M.D.   On: 12/17/2020 21:09   MR Lumbar Spine W Wo Contrast  Result Date: 12/16/2020 CLINICAL DATA:  Initial evaluation for low back pain, cancer suspected. EXAM: MRI LUMBAR SPINE WITHOUT AND WITH CONTRAST TECHNIQUE: Multiplanar and multiecho pulse sequences of the lumbar spine were obtained without and with intravenous contrast. CONTRAST:  10m GADAVIST GADOBUTROL 1 MMOL/ML IV SOLN COMPARISON:  Prior CT from earlier the same day. FINDINGS: Segmentation: Standard. Lowest well-formed disc space labeled the L5-S1 level. Alignment: 4 mm facet mediated anterolisthesis of L4 on L5. Alignment otherwise normal with preservation of the normal lumbar lordosis. Vertebrae: Diffusely abnormal appearance of the bone marrow is seen throughout the visualized lumbar spine and pelvis. Associated heterogeneous STIR hyperintensity with irregular heterogeneous postcontrast enhancement. Findings are highly suspicious for diffuse osseous metastatic disease. Involvement appears to be most pronounced within the T12 vertebral body as well as the visualized pelvis. Exact measurements of a discrete lesion is difficult given the overall infiltrative appearance of this finding. No associated pathologic fracture. No visible extra osseous extension of tumor at this time. Conus medullaris and cauda equina: Conus extends to the L1 level. Conus and cauda equina appear normal. No visible epidural or intracanalicular tumor. Paraspinal and other soft tissues: Mild edema within the subcutaneous fat of the lower back, which could be related to overall volume status.  Paraspinous soft tissues demonstrate no other acute finding. Retroperitoneal/iliac adenopathy noted. Left larger than right adnexal masses partially visualized. Findings better characterized on prior CT. Disc levels: L1-2:  Unremarkable. L2-3: Disc desiccation with mild disc bulge. Superimposed small right foraminal to extraforaminal disc protrusion (series 11, image 17). Mild facet hypertrophy. Underlying short pedicles with a degree of mild spinal stenosis. Mild bilateral L2 foraminal narrowing. L3-4: Disc desiccation with mild disc bulge. Superimposed shallow left extraforaminal disc protrusion with annular fissure (series 11, image 24). Mild facet hypertrophy. Underlying short pedicles. Mild spinal stenosis. Foramina remain patent. L4-5: 4 mm anterolisthesis. Disc desiccation with broad-based posterior pseudo disc bulge. Biforaminal annular fissures noted, left larger than right. Moderate bilateral facet arthrosis. Resultant moderate spinal stenosis. Mild to moderate bilateral L4 foraminal narrowing. L5-S1: Normal interspace. Mild right greater than left facet hypertrophy. No stenosis. IMPRESSION: 1. 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 No associated pathologic fracture or extra osseous extension of tumor at this time. 2. Retroperitoneal/iliac adenopathy with left larger than right adnexal masses, better characterized on prior CT. 3. Underlying multilevel degenerative spondylosis as above, most pronounced at L4-5 where there is resultant moderate spinal stenosis. Electronically Signed   By: BJeannine BogaM.D.   On: 12/16/2020 23:44   MR PELVIS W WO CONTRAST  Result Date: 12/05/2020 CLINICAL DATA:  55year old female with indeterminate hypoechoic left adnexal mass identified in the setting of left  lower quadrant pain. EXAM: MRI PELVIS WITHOUT AND WITH CONTRAST TECHNIQUE: Multiplanar  multisequence MR imaging of the pelvis was performed both before and after administration of intravenous contrast. CONTRAST:  67m GADAVIST GADOBUTROL 1 MMOL/ML IV SOLN COMPARISON:  12/01/2020 pelvic sonogram and unenhanced CT abdomen/pelvis. FINDINGS: Urinary Tract: Mild diffuse bladder wall thickening. There is a poorly marginated enhancing solid 3.7 x 2.8 x 3.0 cm mass centered at the urethra (series 32/image 42 and series 30/image 67), extending from the vesicourethral junction throughout the length of the urethra. There appears to be an underlying right periurethral diverticulum (series 5/image 27). At the inferior tip of the urethra, there is a simple 0.9 x 0.9 x 0.9 cm cystic lesion compatible with a Skene's duct cyst. Bowel: Visualized small and large bowel are normal caliber with no bowel wall thickening. Vascular/Lymphatic: Mild bilateral inguinal lymphadenopathy measuring up to 1.4 cm short axis diameter on the right (series 19/image 71) and 1.4 cm on the left (series 19/image 70). Moderate bilateral external iliac lymphadenopathy measuring up to 1.7 cm bilaterally (series 19/image 53 on the right and 56 on the left). Bilateral common iliac lymphadenopathy, largest 1.6 cm on the right (series 19/image 27) and 1.7 cm on the left (series 19/image 14). Reproductive: Uterus: The anteverted uterus measures 8.2 x 3.7 x 5.5 cm. No uterine fibroids. Inner myometrium (junctional zone) measures 12 mm in thickness, which is compatible with mild diffuse uterine adenomyosis. Endometrium measures 4 mm in bilayer thickness, which is within normal limits. No endometrial cavity fluid or focal endometrial mass. Poorly marginated enhancing solid mass of the left uterine cervix measuring 2.4 x 2.3 x 3.0 cm (series 5/image 21 and series 8/image 22), which appears to disrupt the normal cervical fibrous stroma without frank parametrial invasion. Ovaries and Adnexa: The right ovary measures 4.0 x 2.4 x 4.3 cm and contains  multiple solid avidly enhancing masses, largest 2.3 x 2.0 cm (series 19/image 33). The left ovary measures 8.3 x 7.7 x 8.5 cm and is replaced by a predominantly solid avidly enhancing mass. Other: No abnormal free fluid in the pelvis. No focal pelvic fluid collection. Musculoskeletal: Diffuse patchy confluent nodular replacement of the pelvic osseous structures, best seen on axial series 7. IMPRESSION: 1. Poorly marginated enhancing 3.7 x 2.8 x 3.0 cm mass centered at the urethra, extending from the vesicourethral junction throughout the length of the female urethra, with probable underlying right periurethral diverticulum. This mass is concerning for malignancy. 2. Solid avidly enhancing bilateral ovarian masses, largest 2.3 cm on the right and 8.3 cm on the left, worrisome for bilateral ovarian metastases. 3. Poorly marginated enhancing 2.4 x 2.3 x 3.0 cm mass in the left uterine cervix, which appears to disrupt the normal cervical fibrous stroma without frank parametrial invasion, suspicious for malignancy. 4. Mild-to-moderate bilateral common iliac, bilateral external iliac and bilateral inguinal lymphadenopathy, suspicious for metastatic disease. 5. Diffuse patchy confluent nodular replacement of the pelvic osseous structures, suspicious for osseous metastatic disease. 6. Mild diffuse uterine adenomyosis. Electronically Signed   By: JIlona SorrelM.D.   On: 12/05/2020 20:31   [place]  Result Date: 12/16/2020 CLINICAL DATA:  Pelvic pain EXAM: TRANSABDOMINAL ULTRASOUND OF PELVIS DOPPLER ULTRASOUND OF OVARIES TECHNIQUE: Transabdominal ultrasound examination of the pelvis was performed including evaluation of the uterus, ovaries, adnexal regions, and pelvic cul-de-sac. Color and duplex Doppler ultrasound was utilized to evaluate blood flow to the ovaries. COMPARISON:  CT 12/16/2020, MRI 12/05/2020, pelvic ultrasound 12/01/2020 FINDINGS: Uterus Measurements: 6.8 x  3.4 x 5 cm = volume: 60.5 mL. No  fibroids or other mass visualized. Endometrium Poorly visible, unable to measure. Right ovary Measurements: 4.8 x 2.9 x 5.5 cm = volume: 38.6 mL. Solid right adnexal mass replaces most of right ovary. Left ovary Measurements: 10.5 x 8.7 x 9.2 cm = volume: 440 mL. Enlarged left ovary with heterogenous solid mass that replaces the left ovary. Previously the left ovary measured 8.3 x 7.7 x 8.5 cm on MRI. Pulsed Doppler evaluation demonstrates normal arterial and venous waveforms in both or adnexa, though note that both ovaries are essentially replaced by mass lesions. Other: No significant ascites. Foley catheter within the bladder which appears somewhat thick walled. IMPRESSION: 1. Very limited exam secondary to habitus and only trans abdominal technique 2. Bilateral solid adnexal masses essentially replacing the ovaries which thereby limits assessment of the ovaries. Left adnexal solid mass lesion has significantly increased in size; documented flow in the adnexa relates to flow within the solid adnexal masses. Gynecology follow-up recommended. Electronically Signed   By: Donavan Foil M.D.   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 EXAM: CT ABDOMEN AND PELVIS WITH CONTRAST TECHNIQUE: Multidetector CT imaging of the abdomen and pelvis was performed using the standard protocol following bolus administration of intravenous contrast. CONTRAST:  33m OMNIPAQUE IOHEXOL 350 MG/ML SOLN COMPARISON:  Comparison is made with multiple prior studies which were performed recently, most recent comparison is an MRI of the pelvis of December 05, 2020. FINDINGS: Lower chest: Basilar atelectasis. Subcarinal region not well evaluated. No consolidation or effusion at the lung bases. Hepatobiliary: No focal, suspicious hepatic lesion. No pericholecystic stranding. Portal vein is  patent. No biliary duct dilation. Pancreas: Pancreas with normal contours. No signs of adjacent inflammation. Spleen: Spleen normal size and contour. Adrenals/Urinary Tract: Well-circumscribed LEFT adrenal lesion, density value of 19 Hounsfield units on previous noncontrast imaging measuring 3.2 x 2.6 cm., relative washout is indeterminate at 30%. Normal RIGHT adrenal. Normal, symmetric enhancement of bilateral kidneys without focal renal lesion. Stomach/Bowel: Small hiatal hernia. No stranding adjacent to the stomach. No sign of small bowel obstruction or acute small bowel process. The appendix is normal. Stool in various parts of the colon without signs of obstruction or adjacent stranding. Vascular/Lymphatic: Retroperitoneal adenopathy (image 50/2) 14 mm lymph node previously 11 mm along the LEFT common iliac chain. LEFT pelvic sidewall lymph node (image 75/2) 14 mm short axis, previously 13 mm. Similar size of RIGHT pelvic sidewall/external iliac lymph nodes also with little change compared to the most recent comparison imaging. Reproductive: Foley catheter in situ. Uterus and cervix not well at assess nor are the masses within the area of the cervix and urethra seen on previous MRI. Increasing size of LEFT ovary with heterogeneous appearance now measuring 10 x 8.8 cm. When assessed on the study of October 20, 2020 the ovary for reference measured 3.7 x 2.5 cm. RIGHT ovary with area of enhancement shows enlargement as well but not as pronounced as on previous imaging with 2.8 cm area of enhancement in the RIGHT ovary as the largest discrete area which is similar to the recent MRI evaluation. As compared to the study of October 20, 2020 there is little change in the appearance of the RIGHT ovary aside from this area though there is an increasingly nodular appearance of the ovary in this location.  Mass posterior to the LEFT ovary just above the uterus measuring 4.4 x 2.8 cm has enlarged from 2.5 cm. Other:  Small volume ascites. Musculoskeletal: Heterogeneous pattern associated with the spine in terms of density raises the question of metastatic disease without discrete measurable focal lesion in the spine. For example a geographic area of sclerosis on image 101/5) in the anterior L3 vertebral body does raise the question of underlying lesion with numerous additional foci, essentially nearly all levels of the spine with some degree of heterogeneity. Another example of this finding is noted on the RIGHT at the T10 level. No acute bone finding or frankly destructive bone process. IMPRESSION: Rapid increase in size of the LEFT ovary with some adjacent ascites. The rapid enlargement is seen in the setting of generalized worsening of masses associated with the LEFT and RIGHT ovary but perhaps more pronounced than other areas in the pelvis. The possibility of torsion associated with an ovarian mass could be considered based on this finding. Would correlate with any worsening abdominal pain and with sonogram as warranted for further assessment. Enlarging masses elsewhere though not to the extent that is seen with the LEFT ovary. Masslike area of the cervix and potentially within a urethral diverticulum not as well seen as on the recent MRI evaluation. Small volume ascites. Well-circumscribed LEFT adrenal lesion measuring 3.2 x 2.6 cm, density value of 19 Hounsfield units on previous noncontrast imaging. Relative washout is indeterminate at 30%. This may represent an adrenal adenoma. Consider dedicated adrenal protocol CT or attention on PET evaluation if performed. Heterogeneous pattern of subtle sclerosis and lucency in the spine on today's study raising the question of bony metastatic disease in the spine. Findings of increased ovarian size on the LEFT out of proportion other areas were called by telephone at the time of interpretation on 12/16/2020 at 5:44 pm to provider Theodis Blaze , who verbally acknowledged these  results. Electronically Signed   By: Zetta Bills M.D.   On: 12/16/2020 17:46   [place] Art/Ven Flow Abd Pelv Doppler  Result Date: 12/16/2020 CLINICAL DATA:  Pelvic pain EXAM: TRANSABDOMINAL ULTRASOUND OF PELVIS DOPPLER ULTRASOUND OF OVARIES TECHNIQUE: Transabdominal ultrasound examination of the pelvis was performed including evaluation of the uterus, ovaries, adnexal regions, and pelvic cul-de-sac. Color and duplex Doppler ultrasound was utilized to evaluate blood flow to the ovaries. COMPARISON:  CT 12/16/2020, MRI 12/05/2020, pelvic ultrasound 12/01/2020 FINDINGS: Uterus Measurements: 6.8 x 3.4 x 5 cm = volume: 60.5 mL. No fibroids or other mass visualized. Endometrium Poorly visible, unable to measure. Right ovary Measurements: 4.8 x 2.9 x 5.5 cm = volume: 38.6 mL. Solid right adnexal mass replaces most of right ovary. Left ovary Measurements: 10.5 x 8.7 x 9.2 cm = volume: 440 mL. Enlarged left ovary with heterogenous solid mass that replaces the left ovary. Previously the left ovary measured 8.3 x 7.7 x 8.5 cm on MRI. Pulsed Doppler evaluation demonstrates normal arterial and venous waveforms in both or adnexa, though note that both ovaries are essentially replaced by mass lesions. Other: No significant ascites. Foley catheter within the bladder which appears somewhat thick walled. IMPRESSION: 1. Very limited exam secondary to habitus and only trans abdominal technique 2. Bilateral solid adnexal masses essentially replacing the ovaries which thereby limits assessment of the ovaries. Left adnexal solid mass lesion has significantly increased in size; documented flow in the adnexa relates to flow within the solid adnexal masses. Gynecology follow-up recommended. Electronically Signed   By: Madie Reno.D.  On: 12/16/2020 19:23   CT Renal Stone Study  Result Date: 12/01/2020 CLINICAL DATA:  Flank pain.  Evaluate for kidney stone. EXAM: CT ABDOMEN AND PELVIS WITHOUT CONTRAST TECHNIQUE: Multidetector CT  imaging of the abdomen and pelvis was performed following the standard protocol without IV contrast. COMPARISON:  10/20/2020 FINDINGS: Lower chest: Subsegmental atelectasis versus scar is identified within both lung bases. Hepatobiliary: Mild hepatic steatosis. No focal liver abnormality identified. Gallbladder unremarkable. Pancreas: Unremarkable. No pancreatic ductal dilatation or surrounding inflammatory changes. Spleen: Normal in size without focal abnormality. Right adrenal gland appears normal. Left adrenal mass measures 3.3 x 2.5 cm. Unchanged from 06/02/2016 compatible with a benign adrenal adenoma. Adrenals/Urinary Tract: No kidney stones are identified bilaterally. No signs of hydronephrosis or hydroureter. Mild circumferential wall thickening of the urinary bladder is identified. Stomach/Bowel: Stomach appears normal. The appendix is visualized and appears normal. Moderate stool burden is identified within the colon up to the level of the rectum. No bowel wall thickening, inflammation, or distension. Vascular/Lymphatic: Aortic atherosclerosis.  No aneurysm. Signs of retroperitoneal and pelvic adenopathy, including: 1.1 cm left periaortic node, image 52/2. Increased from 1 cm previously. Left external iliac lymph node measures 1.1 cm, image 71/2. previously 7 mm. Right pelvic sidewall lymph node measures 1.1 cm. This is unchanged from previous exam. Within the right inguinal region there is a 1.3 cm lymph node, image 85/2. Previously 0.9 cm. Left inguinal region there is a 1.4 cm node, image 84/2. Formally 1 cm. Reproductive: There is increased soft tissue fullness in the region of the cervix which appears unchanged from 10/20/2020. This measures 4.9 x 3.5 cm, image 76/2. Marked interval enlargement of left ovary. The left ovary has a masslike appearance measuring 8.4 x 6.6 cm, image 66/2. Formally this measured 4.5 x 2.7 cm. The right ovary measures 4.0 by 3.6 cm, image 66/2. Formally 4.6 by 2.7 cm.  Other: No ascites or focal fluid collections. No signs of peritoneal nodularity. Small periumbilical hernia contains fat only. Supraumbilical fat containing hernia measures 3.3 cm, image 67/6. Infraumbilical hernia also contains fat only measuring 3.4 cm, image 70/6. Fat containing left inguinal hernia noted, small. Musculoskeletal: No acute or suspicious osseous findings. IMPRESSION: 1. No urinary tract calculi identified. 2. Marked interval enlargement of left ovary with masslike appearance. This is new finding compared with 10/20/2020. Given the short interval findings are favored to represent an acute process such as a hemorrhagic cyst. Tubo-ovarian abscess is less favored but not entirely excluded. Malignancy is also a differential consideration but considered less likely given the short interval from 10/20/2020. Initial workup with transabdominal/transvaginal pelvic sonogram is recommended. If the pelvic ultrasound is inconclusive then contrast enhanced pelvic MRI may be considered when the patient is clinically stable, is able to follow instructions, and remain motionless. Ideally this should be performed on a nonemergent basis. 3. Soft tissue fullness in the region of the cervix is again noted and appears similar to previous exam. Etiology is indeterminate. Advise correlation with cervical cancer screening. 4. Retroperitoneal and pelvic adenopathy identified. In the setting of infection this may reflect reactive adenopathy. Differential considerations include metastatic disease as well as lymphoproliferative disorders. 5. Multiple fat containing ventral abdominal wall and left inguinal hernias. 6. Hepatic steatosis. 7. Stable left adrenal adenoma. 8. Aortic Atherosclerosis (ICD10-I70.0). Electronically Signed   By: Kerby Moors M.D.   On: 12/01/2020 06:43   US PELVIC COMPLETE W TRANSVAGINAL AND TORSION R/O  Result Date: 12/01/2020 CLINICAL DATA:  LLQ Pain EXAM: TRANSABDOMINAL AND  TRANSVAGINAL  ULTRASOUND OF PELVIS TECHNIQUE: Both transabdominal and transvaginal ultrasound examinations of the pelvis were performed. Transabdominal technique was performed for global imaging of the pelvis including uterus, ovaries, adnexal regions, and pelvic cul-de-sac. It was necessary to proceed with endovaginal exam following the transabdominal exam to visualize the endometrium and uterus. COMPARISON:  December 01, 2020, September eleventh 2022 FINDINGS: Uterus Uterus is only visualized transvaginally and is suboptimally assessed due to patient incomplete voiding and body habitus. Measurements: 8.8 x 3.6 x 5.0 cm = volume: 84 mL. Bulkiness of the cervix. Endometrium Poorly visualized due to distension of the bladder on transvaginal imaging. Right ovary Not visualized transabdominally or transvaginally. Left adnexal mass: Adnexal mass is labeled as uterus on transabdominal imaging. It measures 9.5 x 6.9 x 7.5 cm for a volume of 260 ML. It is predominately hypoechoic. There are internal anechoic spaces consistent with cystic density within the mass. This corresponds to the mass seen on CT. Doppler imaging was not performed of this mass, limiting evaluation for blood flow. Other findings No abnormal free fluid. IMPRESSION: 1. There is a hypoechoic LEFT adnexal mass which measures approximately 9.5 cm with central internal cystic density. Given newly conspicuous appearance since September 2022, this may reflect a hemorrhagic cyst or sequela of torsed ovary. Recommend GYN consult, and consideration of pelvis MRI w/o and w/ contrast if clinically warranted. Note: This recommendation does not apply to premenarchal patients or to those with increased risk (genetic, family history, elevated tumor markers or other high-risk factors) of ovarian cancer. Reference: Radiology 2019 Nov; 293(2):359-371. 2. Bulkiness of the cervix, suboptimally assessed with current technique. Recommend correlation with physical exam. Electronically Signed    By: Valentino Saxon M.D.   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 EXAM: IMPLANTED PORT A CATH PLACEMENT WITH ULTRASOUND AND FLUOROSCOPIC GUIDANCE COMPARISON:  None. MEDICATIONS: None. ANESTHESIA/SEDATION: Moderate (conscious) sedation was employed during this procedure. A total of Versed 3 mg and Fentanyl 100 mcg was administered intravenously. Moderate Sedation Time: 19 minutes. The patient's level of consciousness and vital signs were monitored continuously by radiology nursing throughout the procedure under my direct supervision. CONTRAST:  None FLUOROSCOPY TIME:  0 minutes, 12 seconds (5 mGy) COMPLICATIONS: None immediate. PROCEDURE: The procedure, risks, benefits, and alternatives were explained to the patient. Questions regarding the procedure were encouraged and answered. The patient understands and consents to the procedure. The right neck and chest were prepped with chlorhexidine in a sterile fashion, and a sterile drape was applied covering the operative field. Maximum barrier sterile technique with sterile gowns and gloves were used for the procedure. A timeout was performed prior to the initiation of the procedure. Ultrasound was used to examine the jugular vein which was compressible and free of internal echoes. A skin marker was used to demarcate the planned venotomy and port pocket incision sites. Local anesthesia was provided to these sites and the subcutaneous tunnel track with 1% lidocaine with 1:100,000 epinephrine. A small incision was created at the jugular access site and blunt dissection was performed of the subcutaneous tissues. Under ultrasound guidance, the jugular vein was accessed with a 21 ga micropuncture needle and an 0.018" wire was inserted to the superior vena cava. Real-time  ultrasound guidance was utilized for vascular access including the acquisition of a permanent ultrasound image documenting patency of the accessed vessel. A 5  Fr micopuncture set was then used, through which a 0.035" Rosen wire was passed under fluoroscopic guidance into the inferior vena cava. An 8 Fr dilator was then placed over the wire. A subcutaneous port pocket was then created along the upper chest wall utilizing a combination of sharp and blunt dissection. The pocket was irrigated with sterile saline, packed with gauze, and observed for hemorrhage. A single lumen "ISP" sized power injectable port was chosen for placement. The 8 Fr catheter was tunneled from the port pocket site to the venotomy incision. The port was placed in the pocket. The external catheter was trimmed to appropriate length. The dilator was exchanged for an 8 Fr peel-away sheath under fluoroscopic guidance. The catheter was then placed through the sheath and the sheath was removed. Final catheter positioning was confirmed and documented with a fluoroscopic spot radiograph. The port was accessed with a Huber needle, aspirated, and flushed with heparinized saline. The deep dermal layer of the port pocket incision was closed with interrupted 3-0 Vicryl suture. The skin was opposed with a running subcuticular 4-0 Monocryl suture. Dermabond was then placed over the port pocket and neck incisions. The patient tolerated the procedure well without immediate post procedural complication. FINDINGS: After catheter placement, the tip lies within the superior cavoatrial junction. The catheter aspirates and flushes normally and is ready for immediate use. IMPRESSION: Successful placement of a power injectable Port-A-Cath via the right internal jugular vein. The catheter is ready for immediate use. Ruthann Cancer, MD [facility] Electronically Signed   By: Ruthann Cancer M.D.   On: 12/26/2020 11:08     ASSESSMENT AND PLAN: 1.  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 -12/05/2020 MRI of the pelvis-poorly marginated enhancing 3.7 x 2.8 x 3.0 cm mass centered at the urethra, solid avidly enhancing bilateral ovarian masses, poorly marginated enhancing 2.4 x 2.3 x 3.0 cm mass in the left uterine cervix, mild to moderate bilateral common iliac, bilateral external iliac, and bilateral inguinal lymphadenopathy, diffuse patchy confluent nodular replacement of the pelvic osseous structures. -12/13/2020 CEA 1864, CA125 29.2 -12/16/2020 CT abdomen/pelvis-rapidly increasing size of the left ovary with some adjacent ascites, enlarging masses elsewhere, masslike area of the cervix and potentially within a urethral diverticulum, well-circumscribed left adrenal lesion measuring 3.2 x 2.6 cm, heterogeneous pattern of subtle sclerosis and lucency in the spine. -12/16/2020 MRI of the lumbar spine-diffusely abnormal appearance of the bone marrow throughout the visualized lumbar spine and pelvis highly suspicious for diffuse osseous metastatic disease, retroperitoneal/iliac adenopathy with left larger than right adnexal masses. -12/17/2020 CT chest-no acute intrathoracic pathology -Upper endoscopy 12/22/2020-gastritis, gastric nodule biopsy-adenocarcinoma, poorly differentiated with signet ring morphology -Colonoscopy 12/22/2020-polyps removed from the ascending and sigmoid colon, extrinsic compression of the sigmoid colon-tubulovillous adenoma without high-grade dysplasia, tubular adenoma and hyperplastic polyps 2.  Abdominal pain secondary #1 3.  Constipation 4.  Normocytic anemia 5.  Leukocytosis 6.  Protein calorie malnutrition 7.  Obstructive sleep apnea 8.  Diabetes mellitus 9.  Hypertension 10.  Possible pulmonary hypertension noted on CT chest  Erin [name] appears stable.  Port-A-Cath was placed 11/17 and is currently accessed.  She is awaiting a bed at [facility].  We  discussed proceeding with FOLFOX in the next few days.  Messages have been sent to the [facility] team and hospitalist and we were advised that we could not administer chemotherapy in [facility].  Additionally, due to the aggressive nature of her malignancy, we cannot wait for  her to finish rehab to initiate chemotherapy. Therefore, we plan to administer FOLFOX today here at [facility].  Potential adverse effects have been discussed with the patient including but not limited to alopecia, myelosuppression, mucositis, nausea, vomiting, diarrhea, peripheral neuropathy.  She agrees to proceed.  Ideally, we would like a PET scan but given that she will be going to [facility] for several weeks, we will consider the PET scan at a later date.  The tumor has been submitted for molecular testing, HER2, and PD-L1 testing and we will follow-up on these results.  Recommendations: 1.  Proceed with cycle 1 of FOLFOX today. 2.  Continue current pain medications. 3.  Plan for outpatient PET scan once she is done with rehab. 4.  We will follow-up on molecular testing, HER2, and PD-L1 results. 5.  May discharge to [facility] once chemotherapy complete (chemo will run through Sunday) 6.  Please call Oncology as needed over the weekend  Future Appointments  Date Time Provider [place]  02/18/2021 10:45 AM [name], PA-C [facility]      LOS: 11 days   [name] 12/27/20  Ms. Hott was interviewed and examined.  She continues to have pain secondary to the metastatic adenocarcinoma.  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  We are waiting on results from molecular testing.  She is being considered for an inpatient rehabilitation stay.  I recommend proceeding with a cycle of chemotherapy prior to the rehabilitation stay.  I recommend FOLFOX.  We reviewed potential toxicities associated with the FOLFOX regimen including the chance for nausea/vomiting, mucositis, diarrhea, alopecia, and  hematologic toxicity.  We discussed the sun sensitivity, rash, hyperpigmentation, and hand/foot syndrome associated with 5-fluorouracil.  We discussed the allergic reaction and various types of neuropathy seen with oxaliplatin.  She agrees to proceed.  The plan is to begin FOLFOX chemotherapy today.  I will check on her 12/30/2020.  Please call oncology over the weekend as needed.  I was present for greater than 50% of today's visit.  I performed medical decision making.  A chemotherapy plan was entered today.

## 2020-12-27 NOTE — [facility] End of Treatment Summary (Signed)
Chemo administered and co-verified with Lupita Raider, RN (ONS certified).  Patient monitored during administration of oxaliplatin/leucovorin/5FU bolus. Infusion of 5 FU started and handoff given to floor RN. Patient tolerated chemo well without any adverse event. VSS at completion of chemo.  Education given to patient on chemo and expected side effects. Patient verbalized understanding. Education materials left with patient.

## 2020-12-27 NOTE — H&P (Incomplete)
Physical Medicine and Rehabilitation Admission H&P    Chief Complaint  Patient presents with   Leg Pain   Abdominal Pain  : HPI: Erin [name] is a 55 year old right-handed female with history of OSA on CPAP, chronic pain syndrome, hypertension diabetes mellitus morbid obesity with BMI 48.06, hyperlipidemia, quit smoking 21 years ago as well as recently diagnosed mass in her urethra September 2022, bilateral ovarian masses, paracervical masses and diffuse findings concerning for bony metastasis in her pelvis on pelvic MRI 10/27.  She was also given a Foley catheter tube for retention at that time and was to follow-up with urology 11/15.  Per chart review lives alone.  1 level apartment with 4 steps to entry.  Reportedly independent prior to admission.  Presented 12/16/2020 with on and off lower abdominal pain recently prescribed oxycodone per GYN/ONC Dr. Berline Lopes as well as reported severe pain down both legs and numbness with tingling.  She does report a 13 pound weight loss over the past several weeks.  Noted elevated CEA 1864, Ca1 25 on admission.  CT of the abdomen/pelvis showed rapid increase in size of left ovarian mass with some ascites along with lucency in the spine concerning for bony metastasis.  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  Underwent EGD/colonoscopy per Dr.Ciriglano showing a sending polyp negative for high-grade dysplasia, 3 sigmoid polyps negative for high-grade dysplasia.  Gastric nodule biopsy notable for adenocarcinoma poorly differentiated with signet ring morphology.  Focal invasion of muscularis mucosa.  Per path report this most likely represented primary source of metastatic carcinoma diagnosis on her biopsy of vagina 11/8.  She continues to be followed by medical oncology Dr. Betsy Coder as well as GYN with plan  outpatient PET study as well as initiate systemic therapy .  She has undergone a single-lumen PowerPort placement 12/26/2020.  Hospital course her Foley catheter tube was removed follow-up urology services voiding without difficulty.  Acute on chronic anemia latest hemoglobin 7.4.  Patient did spike a low-grade temperature 12/30/2020 chest x-ray showed some linear densities both perihilar regions and left lower lung fields suggesting subsegmental atelectasis.  No signs of alveolar pulmonary edema or focal pulmonary consolidation and monitored as well as urinalysis study negative nitrite and one of four blood cultures 11/21 GPC felt to be possibly contaminant no antibiotic therapy initiated and repeat blood cultures no growth to date..  Palliative care was consulted to establish goals of care.  She was cleared to begin Lovenox for DVT prophylaxis.  Therapy evaluations completed due to patient decreased functional mobility was admitted for a comprehensive rehab program.  Review of Systems  Constitutional:  Positive for malaise/fatigue and weight loss. Negative for fever.  HENT:  Negative for hearing loss.   Eyes:  Negative for blurred vision and double vision.  Respiratory:  Positive for shortness of breath. Negative for cough.   Cardiovascular:  Positive for leg swelling. Negative for chest pain and palpitations.  Gastrointestinal:  Positive for abdominal pain, constipation and nausea. Negative for heartburn.       GERD  Genitourinary:  Negative for dysuria, flank pain and hematuria.  Musculoskeletal:  Positive for joint pain and myalgias.  Skin:  Negative for rash.  Psychiatric/Behavioral:  Positive for depression.        Anxiety  All other systems reviewed and are negative. Past Medical History:  Diagnosis Date   Anxiety  Constipation    Depression    GERD (gastroesophageal reflux disease)    Pt states she no longer needs meds   Hyperlipidemia    Hypertension    Menorrhagia    OSA  (obstructive sleep apnea) 11/25/2017   OSA on CPAP    Other fatigue    Shortness of breath on exertion    Type 2 diabetes mellitus ([place])    Vitamin D deficiency    Wears glasses    Past Surgical History:  Procedure Laterality Date   BIOPSY  12/22/2020   Procedure: BIOPSY;  Surgeon: Lavena Bullion, DO;  Location: [facility];  Service: Endoscopy;;   BREAST EXCISIONAL BIOPSY     COLONOSCOPY WITH PROPOFOL N/A 12/22/2020   Procedure: COLONOSCOPY WITH PROPOFOL;  Surgeon: Lavena Bullion, DO;  Location: [facility];  Service: Endoscopy;  Laterality: N/A;   CYSTOSCOPY N/A 12/17/2020   Procedure: CYSTOSCOPY with biopsies;  Surgeon: Robley Fries, MD;  Location: [facility];  Service: Urology;  Laterality: N/A;   DILITATION & CURRETTAGE/HYSTROSCOPY WITH NOVASURE ABLATION N/A 08/26/2015   Procedure: DILATATION & CURETTAGE/HYSTEROSCOPY WITH NOVASURE ABLATION;  Surgeon: [name], MD;  Location: [facility];  Service: Gynecology;  Laterality: N/A;   ESOPHAGOGASTRODUODENOSCOPY (EGD) WITH PROPOFOL N/A 12/22/2020   Procedure: ESOPHAGOGASTRODUODENOSCOPY (EGD) WITH PROPOFOL;  Surgeon: Lavena Bullion, DO;  Location: [facility];  Service: Endoscopy;  Laterality: N/A;   EXCISIONAL LEFT BREAST BX  11-14-2007   benign   IR IMAGING GUIDED PORT INSERTION  12/26/2020   LAPAROSCOPIC TUBAL LIGATION Bilateral 08/26/2015   Procedure: LAPAROSCOPIC TUBAL LIGATION;  Surgeon: [name], MD;  Location: [facility];  Service: Gynecology;  Laterality: Bilateral;   POLYPECTOMY  12/22/2020   Procedure: POLYPECTOMY;  Surgeon: Lavena Bullion, DO;  Location: [facility];  Service: Endoscopy;;   SHOULDER ARTHROSCOPY WITH ROTATOR CUFF REPAIR AND SUBACROMIAL DECOMPRESSION Right 04/08/2020   Procedure: SHOULDER ARTHROSCOPY WITH ROTATOR CUFF REPAIR AND SUBACROMIAL DECOMPRESSION;  Surgeon: Tania Ade, MD;  Location: [facility];  Service: Orthopedics;  Laterality: Right;  NEED 90  MINUTES FOR THIS CASE   THYROID LOBECTOMY Right 04-30-2008   Family History  Problem Relation Age of Onset   Stroke Mother    Diabetes Mother    Hypertension Mother    Obesity Mother    Glaucoma Father    Alcoholism Father    Breast cancer Maternal Aunt    Renal cancer Maternal Uncle    Prostate cancer Maternal Grandfather    Heart disease Paternal Grandmother        enlarged heart   Diabetes Half-Sister    Hypertension Half-Sister    Colon cancer Neg Hx    Esophageal cancer Neg Hx    Stomach cancer Neg Hx    Colon polyps Neg Hx    Rectal cancer Neg Hx    Ovarian cancer Neg Hx    Endometrial cancer Neg Hx    Pancreatic cancer Neg Hx    Social History:  reports that she quit smoking about 21 years ago. Her smoking use included cigarettes. She has never used smokeless tobacco. She reports current drug use. Drug: Marijuana. She reports that she does not drink alcohol. Allergies:  Allergies  Allergen Reactions   Tramadol Other (See Comments)    Per patient increased HR and sweating   Medications Prior to Admission  Medication Sig Dispense Refill   amLODipine (NORVASC) 5 MG tablet TAKE 1 TABLET (5 MG TOTAL) BY MOUTH DAILY. (Patient taking differently:  Take 5 mg by mouth daily.) 90 tablet 3   carvedilol (COREG) 6.25 MG tablet TAKE 1 TABLET BY MOUTH 2 TIMES DAILY WITH A MEAL. (Patient taking differently: Take 6.25 mg by mouth 2 (two) times daily with a meal.) 180 tablet 3   hydrochlorothiazide (HYDRODIURIL) 25 MG tablet TAKE 1 TABLET (25 MG TOTAL) BY MOUTH DAILY. (Patient taking differently: Take 25 mg by mouth daily.) 90 tablet 3   metFORMIN (GLUCOPHAGE) 500 MG tablet Take 1 tablet (500 mg total) by mouth daily. (Patient taking differently: Take 500 mg by mouth daily.) 90 tablet 1   olmesartan (BENICAR) 40 MG tablet TAKE 1 TABLET (40 MG TOTAL) BY MOUTH DAILY. (Patient taking differently: Take 40 mg by mouth daily.) 90 tablet 3   oxyCODONE (OXY IR/ROXICODONE) 5 MG immediate release  tablet Take 1 tablet (5 mg total) by mouth every 6 (six) hours as needed for up to 20 doses for severe pain. 20 tablet 0   rosuvastatin (CRESTOR) 10 MG tablet TAKE 1 TABLET (10 MG TOTAL) BY MOUTH DAILY. (Patient taking differently: Take 10 mg by mouth daily.) 90 tablet 3   buPROPion (WELLBUTRIN SR) 200 MG 12 hr tablet Take 1 tablet (200 mg total) by mouth every morning. (Patient not taking: No sig reported) 30 tablet 0   dexamethasone (DECADRON) 1 MG tablet Take 1 tablet (1 mg total) by mouth at bedtime at 11PM prior to 8AM cortisol lab draw (Patient not taking: No sig reported) 1 tablet 0   HYDROcodone-acetaminophen (NORCO/VICODIN) 5-325 MG tablet Take 1 tablet by mouth every 4 (four) hours as needed. (Patient not taking: No sig reported) 12 tablet 0   metroNIDAZOLE (FLAGYL) 500 MG tablet Take 1 tablet (500 mg total) by mouth 2 (two) times daily. One po bid x 7 days (Patient not taking: No sig reported) 14 tablet 0   oxyCODONE-acetaminophen (PERCOCET/ROXICET) 5-325 MG tablet Take 1 tablet by mouth every 6 (six) hours as needed for severe pain. (Patient not taking: No sig reported) 15 tablet 0   phenazopyridine (PYRIDIUM) 200 MG tablet Take 1 tablet (200 mg total) by mouth 3 (three) times daily as needed for pain. (Patient not taking: No sig reported) 9 tablet 0   Semaglutide, 1 MG/DOSE, (OZEMPIC, 1 MG/DOSE,) 4 MG/3ML SOPN Inject 1 mg into the skin once a week. (Patient not taking: No sig reported) 3 mL 0   traMADol (ULTRAM) 50 MG tablet Take 1 tablet (50 mg total) by mouth every 6 (six) hours for 5 days (Patient not taking: No sig reported) 20 tablet 0   Vitamin D, Ergocalciferol, (DRISDOL) 1.25 MG (50000 UNIT) CAPS capsule Take 1 capsule (50,000 Units total) by mouth every 7 (seven) days. (Patient not taking: No sig reported) 4 capsule 0    Drug Regimen Review Drug regimen was reviewed and remains appropriate with no significant issues identified  Home: Home Living Family/patient expects to be  discharged to:: Private residence Living Arrangements: Alone Available Help at Discharge: Friend(s), Available PRN/intermittently Type of Home: Apartment Home Access: Stairs to enter [employer] of Steps: 4 no rails Entrance Stairs-Rails: None Home Layout: One level Bathroom Shower/Tub: Chiropodist: Standard Home Equipment: Other (comment) (uses golf club as a cane)   Functional History: Prior Function Prior Level of Function : Independent/Modified Independent  Functional Status:  Mobility: Bed Mobility Overal bed mobility: Needs Assistance Bed Mobility: Sit to Supine Supine to sit: Max assist Sit to supine: Max assist General bed mobility comments: sitting EOB on  arrival Transfers Overall transfer level: Needs assistance Equipment used: Rolling walker (2 wheels) Transfers: Sit to/from Stand, Bed to chair/wheelchair/BSC Sit to Stand: Min guard, Min assist Bed to/from chair/wheelchair/BSC transfer type:: Stand pivot Stand pivot transfers: Min guard, Min assist General transfer comment: assisted off elevated bed with increased time and forward weight shift.  Pt sow but steady.  Good safety cognition and use of hands to steady self.  Also assissted off evelavted toilet seat with 25% VC's on proper hand placement. Ambulation/Gait Ambulation/Gait assistance: Supervision, Min guard Gait Distance (Feet): 25 Feet Assistive device: Rolling walker (2 wheels) Gait Pattern/deviations: Step-to pattern, Decreased stride length, Shuffle, Trunk flexed General Gait Details: assisted with amb to bathroom then an increased distance in hallway.  Slow but steady gait.  Mod lean on walker due to ABD pain.  Otherwise, tolerated well. Gait velocity: decreased    ADL:    Cognition: Cognition Overall Cognitive Status: Within Functional Limits for tasks assessed Orientation Level: Oriented X4 Cognition Arousal/Alertness: Awake/alert Behavior During Therapy: WFL  for tasks assessed/performed Overall Cognitive Status: Within Functional Limits for tasks assessed General Comments: AxO x 3 very pleasant and motivated to "get better" and "get back to life".  Physical Exam: Blood pressure (!) 150/81, pulse 98, temperature 97.9 F (36.6 C), temperature source Oral, resp. rate 12, height 5\' 4"  (1.626 m), weight 127 kg, SpO2 98 %. Physical Exam Neurological:     Comments: Patient was alert pleasant no acute distress.  Oriented x3 and follows commands.    Results for orders placed or performed during the hospital encounter of 12/16/20 (from the past 48 hour(s))  Glucose, capillary     Status: Abnormal   Collection Time: 01/04/21  7:40 AM  Result Value Ref Range   Glucose-Capillary 131 (H) 70 - 99 mg/dL    Comment: Glucose reference range applies only to samples taken after fasting for at least 8 hours.  Glucose, capillary     Status: Abnormal   Collection Time: 01/04/21 12:22 PM  Result Value Ref Range   Glucose-Capillary 148 (H) 70 - 99 mg/dL    Comment: Glucose reference range applies only to samples taken after fasting for at least 8 hours.  Glucose, capillary     Status: Abnormal   Collection Time: 01/04/21  4:37 PM  Result Value Ref Range   Glucose-Capillary 129 (H) 70 - 99 mg/dL    Comment: Glucose reference range applies only to samples taken after fasting for at least 8 hours.  Glucose, capillary     Status: Abnormal   Collection Time: 01/04/21  9:16 PM  Result Value Ref Range   Glucose-Capillary 152 (H) 70 - 99 mg/dL    Comment: Glucose reference range applies only to samples taken after fasting for at least 8 hours.  CBC     Status: Abnormal   Collection Time: 01/05/21  6:58 AM  Result Value Ref Range   WBC 6.4 4.0 - 10.5 K/uL   RBC 2.45 (L) 3.87 - 5.11 MIL/uL   Hemoglobin 7.4 (L) 12.0 - 15.0 g/dL   HCT 23.3 (L) 36.0 - 46.0 %   MCV 95.1 80.0 - 100.0 fL   MCH 30.2 26.0 - 34.0 pg   MCHC 31.8 30.0 - 36.0 g/dL   RDW 14.8 11.5 - 15.5 %    Platelets 152 150 - 400 K/uL   nRBC 4.7 (H) 0.0 - 0.2 %    Comment: Performed at [facility], [place] [address], [place], [place] [address]  Glucose, capillary     Status: Abnormal   Collection Time: 01/05/21  7:56 AM  Result Value Ref Range   Glucose-Capillary 123 (H) 70 - 99 mg/dL    Comment: Glucose reference range applies only to samples taken after fasting for at least 8 hours.   Comment 1 Notify RN    Comment 2 Document in Chart   Glucose, capillary     Status: Abnormal   Collection Time: 01/05/21 12:10 PM  Result Value Ref Range   Glucose-Capillary 113 (H) 70 - 99 mg/dL    Comment: Glucose reference range applies only to samples taken after fasting for at least 8 hours.   Comment 1 Notify RN    Comment 2 Document in Chart   Glucose, capillary     Status: Abnormal   Collection Time: 01/05/21  4:45 PM  Result Value Ref Range   Glucose-Capillary 105 (H) 70 - 99 mg/dL    Comment: Glucose reference range applies only to samples taken after fasting for at least 8 hours.   Comment 1 Notify RN    Comment 2 Document in Chart   Glucose, capillary     Status: Abnormal   Collection Time: 01/05/21  9:28 PM  Result Value Ref Range   Glucose-Capillary 119 (H) 70 - 99 mg/dL    Comment: Glucose reference range applies only to samples taken after fasting for at least 8 hours.   No results found.     Medical Problem List and Plan: 1.  Debility secondary to poorly differentiated metastatic adenocarcinoma.  Plan PET scan as outpatient .  She received cycle 1 of FOLFOX 12/27/2020 and she is due for her next chemotherapy 01/10/2021  -patient may *** shower  -ELOS/Goals: *** 2.  Antithrombotics: -DVT/anticoagulation:  Pharmaceutical: Lovenox.  Lower extremity Dopplers negative  -antiplatelet therapy: N/A 3. Pain Management: Neurontin 300 mg nightly, Voltaren gel as directed, OxyContin 40 mg every 12 hours, oxycodone and Flexeril as needed 4. Mood: Provide emotional  support.  Wellbutrin 200 mg daily  -antipsychotic agents: N/A 5. Neuropsych: This patient is capable of making decisions on her own behalf. 6. Skin/Wound Care: Routine skin checks 7. Fluids/Electrolytes/Nutrition: Routine in and outs with follow-up chemistries 8.  Acute on chronic anemia.  Follow-up CBC 9.  Hypertension.  Norvasc 5 mg daily, Coreg 6.25 mg twice daily.  Monitor with increased mobility 10.  Diabetes mellitus.  Hemoglobin A1c 7.1.  SSI.  Patient on Glucophage 500 mg daily prior to admission.  Resume as needed 11.  Obesity.  BMI 48.06.  Dietary follow-up 12.  OSA.  CPAP   Cathlyn Parsons, PA-C 01/06/2021

## 2020-12-27 NOTE — Progress Notes (Signed)
Physical Therapy Treatment Patient Details Name: Erin [name] MRN: [id number] DOB: 04/28/1965 Today's Date: 12/27/2020   History of Present Illness Pt is 54yo female who presented on 11/7 with increasing abdominal pain and L thigh pain. Has recent hx of cervical/urethral masses found on CT 9/22, as well as urethral mass, B ovarian masses, L uterine cervical mass, and pelvis osseous lesions concerning for metastatic disease. Admitted for further w/u and biopsy. PMH HLD, HTN, OSA, DOE, DM, shoulder arthroscopy with rotator cuff repair and subacromial decompression    PT Comments    General Comments: AxO x 3 feeling "so much pain" and worried about "what's to come" starting CHEMO Assisted OOB to [facility] and back to bed this session due to c/o ABD pain and fatigue.   Recommendations for follow up therapy are one component of a multi-disciplinary discharge planning process, led by the attending physician.  Recommendations may be updated based on patient status, additional functional criteria and insurance authorization.  Follow Up Recommendations  Acute inpatient rehab (3hours/day)     Assistance Recommended at Discharge Frequent or constant Supervision/Assistance  Equipment Recommendations  Rolling walker (2 wheels);Hospital bed;BSC/3in1;Wheelchair (measurements PT);Wheelchair cushion (measurements PT)    Recommendations for Other Services Rehab consult     Precautions / Restrictions Precautions Precautions: Fall Precaution Comments: METS pelvis and spine     Mobility  Bed Mobility Overal bed mobility: Needs Assistance Bed Mobility: Supine to Sit;Sit to Supine     Supine to sit: Max assist Sit to supine: Max assist;Total assist   General bed mobility comments: required increased asisst due to pain and fatigue    Transfers Overall transfer level: Needs assistance Equipment used: Rolling walker (2 wheels) Transfers: Sit to/from Stand Sit to Stand: Min assist;Mod assist            General transfer comment: assisted OOB to [facility] then back to bed with 50% VC's on proper hand placement and safety with turns.    Ambulation/Gait               General Gait Details: transfers only this session due to pain and fatigue   Stairs             Wheelchair Mobility    Modified Rankin (Stroke Patients Only)       Balance                                            Cognition Arousal/Alertness: Awake/alert Behavior During Therapy: WFL for tasks assessed/performed Overall Cognitive Status: Within Functional Limits for tasks assessed                                 General Comments: AxO x 3 feeling "so much pain" and worried about "what's to come" starting CHEMO        Exercises      General Comments        Pertinent Vitals/Pain Pain Assessment: Faces Faces Pain Scale: Hurts little more Pain Location: pelvis area/ABD "deep" Pain Descriptors / Indicators: Aching;Discomfort    Home Living                          Prior Function            PT Goals (current goals can now be  found in the care plan section) Progress towards PT goals: Progressing toward goals    Frequency    Min 3X/week      PT Plan Discharge plan needs to be updated;Frequency needs to be updated    Co-evaluation              AM-PAC PT "6 Clicks" Mobility   Outcome Measure  Help needed turning from your back to your side while in a flat bed without using bedrails?: A Lot Help needed moving from lying on your back to sitting on the side of a flat bed without using bedrails?: A Lot Help needed moving to and from a bed to a chair (including a wheelchair)?: A Lot Help needed standing up from a chair using your arms (e.g., wheelchair or bedside chair)?: A Lot Help needed to walk in hospital room?: A Lot Help needed climbing 3-5 steps with a railing? : Total 6 Click Score: 11    End of Session Equipment  Utilized During Treatment: Gait belt Activity Tolerance: Patient limited by pain;Patient limited by fatigue Patient left: in bed;with call bell/phone within reach Nurse Communication: Mobility status PT Visit Diagnosis: Unsteadiness on feet (R26.81);Difficulty in walking, not elsewhere classified (R26.2);Pain;Muscle weakness (generalized) (M62.81)     Time: 5009-3818 PT Time Calculation (min) (ACUTE ONLY): 28 min  Charges:  $Therapeutic Activity: 23-37 mins                     {Zannah Melucci  PTA Acute  Rehabilitation Services Pager      [phone] Office      [phone]

## 2020-12-27 NOTE — Progress Notes (Signed)
Ok to tx with Hgb = 7.7 per Dr Benay Spice

## 2020-12-27 NOTE — Progress Notes (Signed)
Chaplain engaged in a follow-up visit with Erin [name].  Chaplain provided AD paperwork to [place].  She voiced that her niece would be coming tomorrow and they could try to get it done then.    Chaplain offered support.     12/27/20 1400  Clinical Encounter Type  Visited With Patient  Visit Type Follow-up;Social support

## 2020-12-28 DIAGNOSIS — R300 Dysuria: Secondary | ICD-10-CM

## 2020-12-28 LAB — CBC WITH DIFFERENTIAL/PLATELET
Abs Immature Granulocytes: 0.87 10*3/uL — ABNORMAL HIGH (ref 0.00–0.07)
Basophils Absolute: 0 10*3/uL (ref 0.0–0.1)
Basophils Relative: 0 %
Eosinophils Absolute: 0 10*3/uL (ref 0.0–0.5)
Eosinophils Relative: 0 %
HCT: 25.8 % — ABNORMAL LOW (ref 36.0–46.0)
Hemoglobin: 8.1 g/dL — ABNORMAL LOW (ref 12.0–15.0)
Immature Granulocytes: 6 %
Lymphocytes Relative: 8 %
Lymphs Abs: 1.2 10*3/uL (ref 0.7–4.0)
MCH: 29.7 pg (ref 26.0–34.0)
MCHC: 31.4 g/dL (ref 30.0–36.0)
MCV: 94.5 fL (ref 80.0–100.0)
Monocytes Absolute: 0.8 10*3/uL (ref 0.1–1.0)
Monocytes Relative: 6 %
Neutro Abs: 11.4 10*3/uL — ABNORMAL HIGH (ref 1.7–7.7)
Neutrophils Relative %: 80 %
Platelets: 260 10*3/uL (ref 150–400)
RBC: 2.73 MIL/uL — ABNORMAL LOW (ref 3.87–5.11)
RDW: 14.6 % (ref 11.5–15.5)
WBC: 14.3 10*3/uL — ABNORMAL HIGH (ref 4.0–10.5)
nRBC: 0.9 % — ABNORMAL HIGH (ref 0.0–0.2)

## 2020-12-28 LAB — COMPREHENSIVE METABOLIC PANEL
ALT: 17 U/L (ref 0–44)
AST: 45 U/L — ABNORMAL HIGH (ref 15–41)
Albumin: 2.2 g/dL — ABNORMAL LOW (ref 3.5–5.0)
Alkaline Phosphatase: 714 U/L — ABNORMAL HIGH (ref 38–126)
Anion gap: 7 (ref 5–15)
BUN: 17 mg/dL (ref 6–20)
CO2: 25 mmol/L (ref 22–32)
Calcium: 9.2 mg/dL (ref 8.9–10.3)
Chloride: 104 mmol/L (ref 98–111)
Creatinine, Ser: 0.74 mg/dL (ref 0.44–1.00)
GFR, Estimated: >60 mL/min (ref >60)
Glucose, Bld: 146 mg/dL — ABNORMAL HIGH (ref 70–99)
Potassium: 4.4 mmol/L (ref 3.5–5.1)
Sodium: 136 mmol/L (ref 135–145)
Total Bilirubin: 0.4 mg/dL (ref 0.3–1.2)
Total Protein: 6.3 g/dL — ABNORMAL LOW (ref 6.5–8.1)

## 2020-12-28 LAB — GLUCOSE, CAPILLARY
Glucose-Capillary: 130 mg/dL — ABNORMAL HIGH (ref 70–99)
Glucose-Capillary: 124 mg/dL — ABNORMAL HIGH (ref 70–99)
Glucose-Capillary: 129 mg/dL — ABNORMAL HIGH (ref 70–99)
Glucose-Capillary: 190 mg/dL — ABNORMAL HIGH (ref 70–99)

## 2020-12-28 LAB — MAGNESIUM: Magnesium: 2.2 mg/dL (ref 1.7–2.4)

## 2020-12-28 LAB — PHOSPHORUS: Phosphorus: 3.6 mg/dL (ref 2.5–4.6)

## 2020-12-28 MED ORDER — MORPHINE SULFATE (PF) 2 MG/ML IV SOLN
2.0000 mg | Freq: Once | INTRAVENOUS | Status: AC
  Administered 2020-12-28: 2 mg via INTRAVENOUS
  Filled 2020-12-28: qty 1

## 2020-12-28 MED ORDER — DICLOFENAC SODIUM 1 % EX GEL
2.0000 g | Freq: Four times a day (QID) | CUTANEOUS | Status: DC | PRN
  Administered 2020-12-28: 2 g via TOPICAL
  Filled 2020-12-28: qty 100

## 2020-12-28 NOTE — Progress Notes (Signed)
PROGRESS NOTE    Erin [name]  OEU:[id number] DOB: 15-Feb-1965 DOA: 12/16/2020 PCP: Pcp, No   Chief Complaint  Patient presents with   Leg Pain   Abdominal Pain    Brief Narrative:  Erin [name] is Erin [name] 55 y.o. female with PMH OSA on CPAP, HTN, DMII, morbid obesity who presented with worsening abdominal pain and left thigh pain.    She has been undergoing workup for recently discovered cervical/urethral masses on CT in Sept 2022.  Further imaging showed ongoing interval enlargement of the left adnexal area.  There was also associated retroperitoneal and pelvic adenopathy.  MRI pelvis also performed on 12/05/2020 showed poorly enhancing mass centered at the urethra, solid enhancing bilateral ovarian masses, poorly enhancing mass involving left uterine cervix, and significant adenopathy involving bilateral common iliac, bilateral external iliac, bilateral inguinal lymph nodes.  Also noted to have pelvic osseous lesions concerning for metastatic disease as well.   She was admitted for biopsy with urology and GYN oncology.  She was able to undergo cystoscopy with urethral and bladder biopsy on 12/17/2020.  She also underwent cervical biopsies following this procedure.  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[place])   Gastric cancer ([place])   Adenocarcinoma ([place])   Elevated CEA   Cancer related pain   Ovarian mass   Gastritis and gastroduodenitis   Abdominal pain   Dysuria   Malnutrition of moderate degree   Hypokalemia   OSA (obstructive sleep apnea)   Diabetes mellitus ([place])   Essential hypertension  * Mass of urethra - s/p MRI pelvis on 12/05/20   Notable for poorly marginated enhancing 3.7 x 2.8 x 3.0 cm mass centered at urethra concerning for malignancy -- solidly avidly enhancing bilateral  ovarian masses, largest 2.3 cm on R and 8.3 cm on L concerning for bilateral ovarian mets -- poorly marginated enhancing 2.4x2.3x3.0 cm mass in L uterine cervix, concerning for malignancy -- bilateral common iliac, bilateral external iliac, and bilateral inguinal LAD, concerning for metastatic disease -- diffuse patchy confluent nodular replacement of the pelvic osseus structures, concerning for osseus metastatic disease (see report)  -Biopsy taken during cystoscopy on 12/17/2020  - biopsy has returned on 11/11. 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  gastric nodule bx and notable for adenocarcinoma, poorly differentiated with signet ring morphology.  Focal invastion of muscularis mucosa.  Muscularis propia not present in bx.  Per path reports, this most likely represents primary source of metastatic carcinoma dx on bx of vagina on 11/8.   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   gastric nodule bx and notable for adenocarcinoma, poorly differentiated with signet ring morphology.  Focal invastion of muscularis mucosa.  Muscularis propia not present in bx.  Per path  reports, this most likely represents primary source of metastatic carcinoma dx on bx of vagina on 11/8.  Outpatient PET study pending for  outpatient  Appreciate oncology and gyn onc   Cancer related pain Continue current regimen, if issues with control, may need to discuss with palliative.  Complains of continued pain, but not actually getting prn breakthrough that frequently.  Continue to encourage use of prn meds as needed.  Elevated CEA CEA 1864, CA 125 29 on admission  Gastritis and gastroduodenitis EGD with gastritis PPI x 6 weeks then 40 mg daily bx as noted above  Colonoscopy with 5 polyps removed (1 in ascending and 4 in sigmoid) bx as noted above  Ovarian mass Noted on MRI pelvis Workup as noted above  Abdominal pain 2/2 metastatic cancer Continue to adjust pain regimen as needed  Dysuria Follow UA and culture  Acute lower UTI-resolved as of 12/24/2020 S/p ceftriaxone Culture with strep viridans resolved   Constipation-resolved as of 12/24/2020 resolved  Malnutrition of moderate degree - Moderate malnutrition in context of acute illness/injury, morbid obesity - Evaluated by dietitian, appreciate assistance - per RD: "Moderate Malnutrition related to acute illness as evidenced by percent weight loss, moderate muscle depletion, energy intake < 75% for > 7 days."  Hypokalemia Replace and follow  OSA (obstructive sleep apnea) CPAP  Diabetes mellitus ([place]) SSI  Essential hypertension Amlodipine, coreg, follow  Likely d/c to CIR with plan for oncology to follow up  DVT prophylaxis: lovenox Code Status: full Family Communication: niece at bedside Disposition:   Status is: Inpatient  Remains inpatient appropriate because: unsafe d/c plan       Consultants:  Oncology IR Urology GI Palliative care Gyn oncology  Procedures:  Diagnostic cystoscopy, bladder biopsy, urethral biopsy, 12/17/20 Cervical biopsies, 12/17/2020 EGD and colonoscopy 12/22/20 Port placement by IR 11/17  Antimicrobials: Anti-infectives (From admission, onward)    Start     Dose/Rate Route Frequency  Ordered Stop   12/23/20 1000  cefTRIAXone (ROCEPHIN) 1 g in sodium chloride 0.9 % 100 mL IVPB        1 g 200 mL/hr over 30 Minutes Intravenous Every 24 hours 12/23/20 0746 12/24/20 1030   12/20/20 1000  cefTRIAXone (ROCEPHIN) 1 g in sodium chloride 0.9 % 100 mL IVPB        1 g 200 mL/hr over 30 Minutes Intravenous Every 24 hours 12/20/20 0752 12/22/20 1451   12/17/20 0930  ceFAZolin (ANCEF) 3 g in dextrose 5 % 50 mL IVPB  Status:  Discontinued        3 g 100 mL/hr over 30 Minutes Intravenous  Once 12/17/20 0925 12/17/20 0926   12/17/20 0915  ceFAZolin (ANCEF) IVPB 3g/100 mL premix        3 g 200 mL/hr over 30 Minutes Intravenous  Once 12/17/20 0914 12/17/20 0935       Subjective: Sleepy, c/o thigh cramping  Objective: Vitals:   12/28/20 0043 12/28/20 0412 12/28/20 0835 12/28/20 1324  BP: (!) 148/86 (!) 162/88 137/82 (!) 148/74  Pulse: 85 95 92 95  Resp: 17 17 15 20   Temp: 98 F (36.7 C) (!) 97.5 F (36.4 C) 98.3 F (36.8 C) 97.7 F (36.5 C)  TempSrc:  Oral Oral Oral  SpO2: 93% 97% 97% 96%  Weight:      Height:        Intake/Output Summary (Last 24 hours) at 12/28/2020 1917 Last data filed at 12/28/2020 1852 Gross per 24 hour  Intake 235 ml  Output  700 ml  Net -465 ml   Filed Weights   12/16/20 1307 12/17/20 0841 12/22/20 0928  Weight: 127 kg 127 kg 127 kg    Examination:  General: No acute distress. Lungs: unlabored Neurological: Alert and oriented 3. Moves all extremities 4. Cranial nerves II through XII grossly intact. Skin: Warm and dry. No rashes or lesions. Extremities: No clubbing or cyanosis. No edema.    Data Reviewed: I have personally reviewed following labs and imaging studies  CBC: Recent Labs  Lab 12/24/20 0412 12/25/20 0415 12/26/20 0405 12/27/20 0357 12/28/20 0535  WBC 13.2* 13.1* 13.0* 11.8* 14.3*  NEUTROABS 9.4* 9.8* 9.7* 8.7* 11.4*  HGB 8.1* 8.3* 8.0* 7.7* 8.1*  HCT 25.4* 26.1* 25.6* 25.0* 25.8*  MCV 93.7 95.3 95.9 95.4 94.5   PLT 284 258 250 245 098    Basic Metabolic Panel: Recent Labs  Lab 12/24/20 0412 12/25/20 0415 12/26/20 0405 12/27/20 0357 12/28/20 0535  NA 140 132* 135 136 136  K 4.1 3.8 3.9 4.0 4.4  CL 107 101 105 106 104  CO2 24 22 23 22 25   GLUCOSE 132* 130* 130* 123* 146*  BUN 23* 19 18 16 17   CREATININE 0.81 0.77 0.79 0.77 0.74  CALCIUM 9.5 8.9 9.4 9.2 9.2  MG 2.2 2.1 2.2 2.3 2.2  PHOS  --   --   --   --  3.6    GFR: Estimated Creatinine Clearance: 106.1 mL/min (by C-G formula based on SCr of 0.74 mg/dL).  Liver Function Tests: Recent Labs  Lab 12/22/20 0433 12/23/20 0410 12/28/20 0535  AST 35 29 45*  ALT 18 15 17   ALKPHOS 672* 631* 714*  BILITOT 0.6 0.6 0.4  PROT 6.1* 6.1* 6.3*  ALBUMIN 2.1* 2.1* 2.2*    CBG: Recent Labs  Lab 12/27/20 1124 12/27/20 2205 12/28/20 0734 12/28/20 1209 12/28/20 1623  GLUCAP 126* 166* 130* 124* 129*     No results found for this or any previous visit (from the past 240 hour(s)).        Radiology Studies: No results found.      Scheduled Meds:  amLODipine  5 mg Oral Daily   carvedilol  6.25 mg Oral BID WC   Chlorhexidine Gluconate Cloth  6 each Topical Daily   enoxaparin (LOVENOX) injection  60 mg Subcutaneous Q24H   feeding supplement  237 mL Oral BID BM   FLUOROURACIL (ADRUCIL) CHEMO infusion For Inpatient Use  2,000 mg/m2 (Treatment Plan Recorded) Intravenous Once   gabapentin  100 mg Oral QHS   insulin aspart  0-9 Units Subcutaneous TID WC   oxyCODONE  40 mg Oral Q12H   pantoprazole  40 mg Oral BID   polyethylene glycol  17 g Oral Daily   senna-docusate  1 tablet Oral BID   Continuous Infusions:   LOS: 12 days    Time spent: over 30 min    Fayrene Helper, MD [facility]   To contact the attending provider between 7A-7P or the covering provider during after hours 7P-7A, please log into the web site [email or web address] and access using universal [place] password for that web site. If you do not  have the password, please call the hospital operator.  12/28/2020, 7:17 PM

## 2020-12-28 NOTE — Progress Notes (Signed)
RT notified, pt. Ready to be placed on cpap. Alfredo Batty

## 2020-12-28 NOTE — Assessment & Plan Note (Signed)
Follow UA and culture

## 2020-12-28 NOTE — Plan of Care (Signed)
  Problem: Education: Goal: Knowledge of General Education information will improve Description: Including pain rating scale, medication(s)/side effects and non-pharmacologic comfort measures Outcome: Progressing   Problem: Clinical Measurements: Goal: Will remain free from infection Outcome: Progressing   

## 2020-12-28 NOTE — Progress Notes (Signed)
Pt received from [place], via bed. Oriented to unit and room, pt alert and oriented, oriented to use of call bell,reports 10/10 pain, medicated per MD order. See flowsheet for assessment. Pt . With urinary incontinence, cleaned and purewick applied. No distress noted, will continue to monitor pt closely. Neomia Dear RN

## 2020-12-29 ENCOUNTER — Inpatient Hospital Stay (HOSPITAL_COMMUNITY): Payer: Self-pay

## 2020-12-29 DIAGNOSIS — M79604 Pain in right leg: Secondary | ICD-10-CM

## 2020-12-29 DIAGNOSIS — C169 Malignant neoplasm of stomach, unspecified: Principal | ICD-10-CM

## 2020-12-29 DIAGNOSIS — G893 Neoplasm related pain (acute) (chronic): Secondary | ICD-10-CM

## 2020-12-29 DIAGNOSIS — M79605 Pain in left leg: Secondary | ICD-10-CM

## 2020-12-29 DIAGNOSIS — F410 Panic disorder [episodic paroxysmal anxiety] without agoraphobia: Secondary | ICD-10-CM

## 2020-12-29 LAB — CBC WITH DIFFERENTIAL/PLATELET
Abs Immature Granulocytes: 0.33 10*3/uL — ABNORMAL HIGH (ref 0.00–0.07)
Basophils Absolute: 0 10*3/uL (ref 0.0–0.1)
Basophils Relative: 0 %
Eosinophils Absolute: 0 10*3/uL (ref 0.0–0.5)
Eosinophils Relative: 0 %
HCT: 24.3 % — ABNORMAL LOW (ref 36.0–46.0)
Hemoglobin: 7.6 g/dL — ABNORMAL LOW (ref 12.0–15.0)
Immature Granulocytes: 3 %
Lymphocytes Relative: 13 %
Lymphs Abs: 1.4 10*3/uL (ref 0.7–4.0)
MCH: 29.8 pg (ref 26.0–34.0)
MCHC: 31.3 g/dL (ref 30.0–36.0)
MCV: 95.3 fL (ref 80.0–100.0)
Monocytes Absolute: 0.3 10*3/uL (ref 0.1–1.0)
Monocytes Relative: 3 %
Neutro Abs: 8.8 10*3/uL — ABNORMAL HIGH (ref 1.7–7.7)
Neutrophils Relative %: 81 %
Platelets: 228 10*3/uL (ref 150–400)
RBC: 2.55 MIL/uL — ABNORMAL LOW (ref 3.87–5.11)
RDW: 14.8 % (ref 11.5–15.5)
WBC: 10.8 10*3/uL — ABNORMAL HIGH (ref 4.0–10.5)
nRBC: 0.4 % — ABNORMAL HIGH (ref 0.0–0.2)

## 2020-12-29 LAB — PHOSPHORUS: Phosphorus: 3.3 mg/dL (ref 2.5–4.6)

## 2020-12-29 LAB — COMPREHENSIVE METABOLIC PANEL
ALT: 36 U/L (ref 0–44)
AST: 70 U/L — ABNORMAL HIGH (ref 15–41)
Albumin: 2.1 g/dL — ABNORMAL LOW (ref 3.5–5.0)
Alkaline Phosphatase: 712 U/L — ABNORMAL HIGH (ref 38–126)
Anion gap: 8 (ref 5–15)
BUN: 24 mg/dL — ABNORMAL HIGH (ref 6–20)
CO2: 24 mmol/L (ref 22–32)
Calcium: 9.2 mg/dL (ref 8.9–10.3)
Chloride: 103 mmol/L (ref 98–111)
Creatinine, Ser: 0.73 mg/dL (ref 0.44–1.00)
GFR, Estimated: >60 mL/min (ref >60)
Glucose, Bld: 148 mg/dL — ABNORMAL HIGH (ref 70–99)
Potassium: 4.1 mmol/L (ref 3.5–5.1)
Sodium: 135 mmol/L (ref 135–145)
Total Bilirubin: 0.5 mg/dL (ref 0.3–1.2)
Total Protein: 6.1 g/dL — ABNORMAL LOW (ref 6.5–8.1)

## 2020-12-29 LAB — GLUCOSE, CAPILLARY
Glucose-Capillary: 147 mg/dL — ABNORMAL HIGH (ref 70–99)
Glucose-Capillary: 149 mg/dL — ABNORMAL HIGH (ref 70–99)
Glucose-Capillary: 152 mg/dL — ABNORMAL HIGH (ref 70–99)
Glucose-Capillary: 190 mg/dL — ABNORMAL HIGH (ref 70–99)

## 2020-12-29 LAB — MAGNESIUM: Magnesium: 2.3 mg/dL (ref 1.7–2.4)

## 2020-12-29 MED ORDER — OXYCODONE HCL 5 MG PO TABS
15.0000 mg | ORAL_TABLET | ORAL | Status: DC | PRN
  Administered 2020-12-29 – 2021-01-03 (×19): 15 mg via ORAL
  Filled 2020-12-29 (×20): qty 3

## 2020-12-29 MED ORDER — LORAZEPAM 2 MG/ML IJ SOLN
0.5000 mg | INTRAMUSCULAR | Status: DC | PRN
  Administered 2021-01-05 – 2021-01-12 (×6): 0.5 mg via INTRAVENOUS
  Filled 2020-12-29 (×6): qty 1

## 2020-12-29 MED ORDER — GABAPENTIN 300 MG PO CAPS
300.0000 mg | ORAL_CAPSULE | Freq: Every day | ORAL | Status: DC
  Administered 2020-12-29 – 2021-01-13 (×17): 300 mg via ORAL
  Filled 2020-12-29 (×17): qty 1

## 2020-12-29 MED ORDER — CYCLOBENZAPRINE HCL 5 MG PO TABS
5.0000 mg | ORAL_TABLET | Freq: Three times a day (TID) | ORAL | Status: DC | PRN
  Administered 2021-01-01 – 2021-01-13 (×15): 5 mg via ORAL
  Filled 2020-12-29 (×16): qty 1

## 2020-12-29 NOTE — Progress Notes (Signed)
PROGRESS NOTE    Erin [name]  SEG:[id number] DOB: 01-03-1966 DOA: 12/16/2020 PCP: Pcp, No   Chief Complaint  Patient presents with   Leg Pain   Abdominal Pain    Brief Narrative:  Erin [name] is Erin [name] 55 y.o. female with PMH OSA on CPAP, HTN, DMII, morbid obesity who presented with worsening abdominal pain and left thigh pain.    She has been undergoing workup for recently discovered cervical/urethral masses on CT in Sept 2022.  Further imaging showed ongoing interval enlargement of the left adnexal area.  There was also associated retroperitoneal and pelvic adenopathy.  MRI pelvis also performed on 12/05/2020 showed poorly enhancing mass centered at the urethra, solid enhancing bilateral ovarian masses, poorly enhancing mass involving left uterine cervix, and significant adenopathy involving bilateral common iliac, bilateral external iliac, bilateral inguinal lymph nodes.  Also noted to have pelvic osseous lesions concerning for metastatic disease as well.   She was admitted for biopsy with urology and GYN oncology.  She was able to undergo cystoscopy with urethral and bladder biopsy on 12/17/2020.  She also underwent cervical biopsies following this procedure.  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[place])   Gastric cancer ([place])   Adenocarcinoma ([place])   Elevated CEA   Cancer related pain   Panic attack   Ovarian mass   Gastritis and gastroduodenitis   Abdominal pain   Dysuria   Malnutrition of moderate degree   Hypokalemia   OSA (obstructive sleep apnea)   Diabetes mellitus ([place])   Essential hypertension  * Mass of urethra - s/p MRI pelvis on 12/05/20   Notable for poorly marginated enhancing 3.7 x 2.8 x 3.0 cm mass centered at urethra concerning for malignancy -- solidly avidly  enhancing bilateral ovarian masses, largest 2.3 cm on R and 8.3 cm on L concerning for bilateral ovarian mets -- poorly marginated enhancing 2.4x2.3x3.0 cm mass in L uterine cervix, concerning for malignancy -- bilateral common iliac, bilateral external iliac, and bilateral inguinal LAD, concerning for metastatic disease -- diffuse patchy confluent nodular replacement of the pelvic osseus structures, concerning for osseus metastatic disease (see report)  -Biopsy taken during cystoscopy on 12/17/2020  - biopsy has returned on 11/11. 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  gastric nodule bx and notable for adenocarcinoma, poorly differentiated with signet ring morphology.  Focal invastion of muscularis mucosa.  Muscularis propia not present in bx.  Per path reports, this most likely represents primary source of metastatic carcinoma dx on bx of vagina on 11/8.   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   gastric nodule bx and notable for adenocarcinoma, poorly differentiated with signet ring morphology.  Focal invastion of muscularis mucosa.  Muscularis propia not present in  bx.  Per path reports, this most likely represents primary source of metastatic carcinoma dx on bx of vagina on 11/8.  Outpatient PET  study pending for outpatient  Appreciate oncology and gyn onc   Cancer related pain Will increase short acting oxycodone Continue long acting 40 mg BID Will ask palliative to reassess, appreciate assistance  LE [place] for LE pain negative.  Trial flexeril.  Increase gabapentin.  Elevated CEA CEA 1864, CA 125 29 on admission  Panic attack Sounds like panic attack last night Out of body feeling, feeling of impending doom Prn benzo  Gastritis and gastroduodenitis EGD with gastritis PPI x 6 weeks then 40 mg daily bx as noted above  Colonoscopy with 5 polyps removed (1 in ascending and 4 in sigmoid) bx as noted above  Ovarian mass Noted on MRI pelvis Workup as noted above  Abdominal pain 2/2 metastatic cancer Continue to adjust pain regimen as needed  Dysuria Follow UA and culture  Acute lower UTI-resolved as of 12/24/2020 S/p ceftriaxone Culture with strep viridans resolved   Constipation-resolved as of 12/24/2020 resolved  Malnutrition of moderate degree - Moderate malnutrition in context of acute illness/injury, morbid obesity - Evaluated by dietitian, appreciate assistance - per RD: "Moderate Malnutrition related to acute illness as evidenced by percent weight loss, moderate muscle depletion, energy intake < 75% for > 7 days."  Hypokalemia Replace and follow  OSA (obstructive sleep apnea) CPAP  Diabetes mellitus ([place]) SSI  Essential hypertension Amlodipine, coreg, follow  Likely d/c to CIR with plan for oncology to follow up  DVT prophylaxis: lovenox Code Status: full Family Communication: niece at bedside Disposition:   Status is: Inpatient  Remains inpatient appropriate because: unsafe d/c plan       Consultants:  Oncology IR Urology GI Palliative care Gyn oncology  Procedures:  Diagnostic cystoscopy, bladder biopsy, urethral biopsy, 12/17/20 Cervical biopsies, 12/17/2020 EGD and colonoscopy 12/22/20 Port placement by IR  11/17  Antimicrobials: Anti-infectives (From admission, onward)    Start     Dose/Rate Route Frequency Ordered Stop   12/23/20 1000  cefTRIAXone (ROCEPHIN) 1 g in sodium chloride 0.9 % 100 mL IVPB        1 g 200 mL/hr over 30 Minutes Intravenous Every 24 hours 12/23/20 0746 12/24/20 1030   12/20/20 1000  cefTRIAXone (ROCEPHIN) 1 g in sodium chloride 0.9 % 100 mL IVPB        1 g 200 mL/hr over 30 Minutes Intravenous Every 24 hours 12/20/20 0752 12/22/20 1451   12/17/20 0930  ceFAZolin (ANCEF) 3 g in dextrose 5 % 50 mL IVPB  Status:  Discontinued        3 g 100 mL/hr over 30 Minutes Intravenous  Once 12/17/20 0925 12/17/20 0926   12/17/20 0915  ceFAZolin (ANCEF) IVPB 3g/100 mL premix        3 g 200 mL/hr over 30 Minutes Intravenous  Once 12/17/20 0914 12/17/20 0935       Subjective: Describes LE discomfort, like cramping? Also, sensation of doom, panic last night.   No CP or SOB.  Objective: Vitals:   12/28/20 2010 12/28/20 2147 12/29/20 0607 12/29/20 1336  BP:  (!) 138/91 138/81 (!) 159/88  Pulse:  (!) 102 98 (!) 108  Resp: 18 18 19 18   Temp:  98.5 F (36.9 C) 98.2 F (36.8 C) 98.8 F (37.1 C)  TempSrc:  Oral Oral Oral  SpO2:  96% 97% 90%  Weight:      Height:  Intake/Output Summary (Last 24 hours) at 12/29/2020 1848 Last data filed at 12/29/2020 1841 Gross per 24 hour  Intake 1160.83 ml  Output 1800 ml  Net -639.17 ml   Filed Weights   12/16/20 1307 12/17/20 0841 12/22/20 0928  Weight: 127 kg 127 kg 127 kg    Examination:  General: No acute distress. Cardiovascular: RRR Lungs: unlabored Abdomen: Soft, nontender, nondistended Neurological: Alert and oriented 3. Moves all extremities 4. Cranial nerves II through XII grossly intact. Skin: Warm and dry. No rashes or lesions. Extremities: No clubbing or cyanosis. No edema.    Data Reviewed: I have personally reviewed following labs and imaging studies  CBC: Recent Labs  Lab 12/25/20 0415  12/26/20 0405 12/27/20 0357 12/28/20 0535 12/29/20 0541  WBC 13.1* 13.0* 11.8* 14.3* 10.8*  NEUTROABS 9.8* 9.7* 8.7* 11.4* 8.8*  HGB 8.3* 8.0* 7.7* 8.1* 7.6*  HCT 26.1* 25.6* 25.0* 25.8* 24.3*  MCV 95.3 95.9 95.4 94.5 95.3  PLT 258 250 245 260 734    Basic Metabolic Panel: Recent Labs  Lab 12/25/20 0415 12/26/20 0405 12/27/20 0357 12/28/20 0535 12/29/20 0541  NA 132* 135 136 136 135  K 3.8 3.9 4.0 4.4 4.1  CL 101 105 106 104 103  CO2 22 23 22 25 24   GLUCOSE 130* 130* 123* 146* 148*  BUN 19 18 16 17  24*  CREATININE 0.77 0.79 0.77 0.74 0.73  CALCIUM 8.9 9.4 9.2 9.2 9.2  MG 2.1 2.2 2.3 2.2 2.3  PHOS  --   --   --  3.6 3.3    GFR: Estimated Creatinine Clearance: 106.1 mL/min (by C-G formula based on SCr of 0.73 mg/dL).  Liver Function Tests: Recent Labs  Lab 12/23/20 0410 12/28/20 0535 12/29/20 0541  AST 29 45* 70*  ALT 15 17 36  ALKPHOS 631* 714* 712*  BILITOT 0.6 0.4 0.5  PROT 6.1* 6.3* 6.1*  ALBUMIN 2.1* 2.2* 2.1*    CBG: Recent Labs  Lab 12/28/20 1623 12/28/20 2148 12/29/20 0719 12/29/20 1207 12/29/20 1648  GLUCAP 129* 190* 147* 149* 152*     No results found for this or any previous visit (from the past 240 hour(s)).        Radiology Studies: VAS [place] LOWER EXTREMITY VENOUS (DVT)  Result Date: 12/29/2020  Lower Venous DVT Study Patient Name:  Erin [name]  Date of Exam:   12/29/2020 Medical Rec #: [id number]            Accession #:    [id number] Date of Birth: April 03, 1965            Patient Gender: F Patient Age:   79 years Exam Location:  [facility] Procedure:      VAS [place] LOWER EXTREMITY VENOUS (DVT) Referring Phys: Giani Winther POWELL JR --------------------------------------------------------------------------------  Indications: Pain.  Risk Factors: Cancer. Limitations: Body habitus and poor ultrasound/tissue interface. Comparison Study: No prior studies. Performing Technologist: Oliver Hum RVT  Examination Guidelines: Saleemah Mollenhauer complete  evaluation includes B-mode imaging, spectral Doppler, color Doppler, and power Doppler as needed of all accessible portions of each vessel. Bilateral testing is considered an integral part of Devante Capano complete examination. Limited examinations for reoccurring indications may be performed as noted. The reflux portion of the exam is performed with the patient in reverse Trendelenburg.  +---------+---------------+---------+-----------+----------+--------------+ RIGHT    CompressibilityPhasicitySpontaneityPropertiesThrombus Aging +---------+---------------+---------+-----------+----------+--------------+ CFV      Full           Yes      Yes                                 +---------+---------------+---------+-----------+----------+--------------+  SFJ      Full                                                        +---------+---------------+---------+-----------+----------+--------------+ FV Prox  Full                                                        +---------+---------------+---------+-----------+----------+--------------+ FV Mid   Full                                                        +---------+---------------+---------+-----------+----------+--------------+ FV DistalFull                                                        +---------+---------------+---------+-----------+----------+--------------+ PFV      Full                                                        +---------+---------------+---------+-----------+----------+--------------+ POP      Full           Yes      Yes                                 +---------+---------------+---------+-----------+----------+--------------+ PTV      Full                                                        +---------+---------------+---------+-----------+----------+--------------+ PERO     Full                                                         +---------+---------------+---------+-----------+----------+--------------+   +---------+---------------+---------+-----------+----------+--------------+ LEFT     CompressibilityPhasicitySpontaneityPropertiesThrombus Aging +---------+---------------+---------+-----------+----------+--------------+ CFV      Full           Yes      Yes                                 +---------+---------------+---------+-----------+----------+--------------+ SFJ      Full                                                        +---------+---------------+---------+-----------+----------+--------------+  FV Prox  Full                                                        +---------+---------------+---------+-----------+----------+--------------+ FV Mid   Full                                                        +---------+---------------+---------+-----------+----------+--------------+ FV DistalFull                                                        +---------+---------------+---------+-----------+----------+--------------+ PFV      Full                                                        +---------+---------------+---------+-----------+----------+--------------+ POP      Full           Yes      Yes                                 +---------+---------------+---------+-----------+----------+--------------+ PTV      Full                                                        +---------+---------------+---------+-----------+----------+--------------+ PERO     Full                                                        +---------+---------------+---------+-----------+----------+--------------+     Summary: RIGHT: - There is no evidence of deep vein thrombosis in the lower extremity.  - No cystic structure found in the popliteal fossa.  LEFT: - There is no evidence of deep vein thrombosis in the lower extremity.  - No cystic structure found in the popliteal fossa.   *See table(s) above for measurements and observations. Electronically signed by Jamelle Haring on 12/29/2020 at 12:00:16 PM.    Final         Scheduled Meds:  amLODipine  5 mg Oral Daily   carvedilol  6.25 mg Oral BID WC   Chlorhexidine Gluconate Cloth  6 each Topical Daily   enoxaparin (LOVENOX) injection  60 mg Subcutaneous Q24H   feeding supplement  237 mL Oral BID BM   gabapentin  300 mg Oral QHS   insulin aspart  0-9 Units Subcutaneous TID WC   oxyCODONE  40 mg Oral Q12H   pantoprazole  40 mg Oral BID   polyethylene glycol  17 g Oral Daily  senna-docusate  1 tablet Oral BID   Continuous Infusions:   LOS: 13 days    Time spent: over 30 min    Fayrene Helper, MD [facility]   To contact the attending provider between 7A-7P or the covering provider during after hours 7P-7A, please log into the web site [email or web address] and access using universal [place] password for that web site. If you do not have the password, please call the hospital operator.  12/29/2020, 6:48 PM

## 2020-12-29 NOTE — Progress Notes (Signed)
Bilateral lower extremity venous duplex has been completed. Preliminary results can be found in CV Proc through chart review.   12/29/20 11:49 AM Erin [name] RVT

## 2020-12-29 NOTE — Assessment & Plan Note (Addendum)
Panic attack 11/19-20 overnight Out of body feeling, feeling of impending doom Prn benzo

## 2020-12-29 NOTE — Progress Notes (Signed)
Pt states she will call when ready for CPAP QHS.

## 2020-12-30 ENCOUNTER — Encounter: Payer: Self-pay | Admitting: *Deleted

## 2020-12-30 ENCOUNTER — Inpatient Hospital Stay (HOSPITAL_COMMUNITY): Payer: Self-pay

## 2020-12-30 DIAGNOSIS — R509 Fever, unspecified: Secondary | ICD-10-CM

## 2020-12-30 DIAGNOSIS — A419 Sepsis, unspecified organism: Secondary | ICD-10-CM

## 2020-12-30 LAB — GLUCOSE, CAPILLARY
Glucose-Capillary: 199 mg/dL — ABNORMAL HIGH (ref 70–99)
Glucose-Capillary: 138 mg/dL — ABNORMAL HIGH (ref 70–99)
Glucose-Capillary: 128 mg/dL — ABNORMAL HIGH (ref 70–99)
Glucose-Capillary: 142 mg/dL — ABNORMAL HIGH (ref 70–99)
Glucose-Capillary: 166 mg/dL — ABNORMAL HIGH (ref 70–99)

## 2020-12-30 LAB — CBC WITH DIFFERENTIAL/PLATELET
Abs Immature Granulocytes: 0.13 10*3/uL — ABNORMAL HIGH (ref 0.00–0.07)
Basophils Absolute: 0 10*3/uL (ref 0.0–0.1)
Basophils Relative: 0 %
Eosinophils Absolute: 0 10*3/uL (ref 0.0–0.5)
Eosinophils Relative: 0 %
HCT: 25.3 % — ABNORMAL LOW (ref 36.0–46.0)
Hemoglobin: 7.9 g/dL — ABNORMAL LOW (ref 12.0–15.0)
Immature Granulocytes: 1 %
Lymphocytes Relative: 12 %
Lymphs Abs: 1.1 10*3/uL (ref 0.7–4.0)
MCH: 29.7 pg (ref 26.0–34.0)
MCHC: 31.2 g/dL (ref 30.0–36.0)
MCV: 95.1 fL (ref 80.0–100.0)
Monocytes Absolute: 0.1 10*3/uL (ref 0.1–1.0)
Monocytes Relative: 1 %
Neutro Abs: 7.7 10*3/uL (ref 1.7–7.7)
Neutrophils Relative %: 86 %
Platelets: 210 10*3/uL (ref 150–400)
RBC: 2.66 MIL/uL — ABNORMAL LOW (ref 3.87–5.11)
RDW: 14.9 % (ref 11.5–15.5)
WBC: 9 10*3/uL (ref 4.0–10.5)
nRBC: 0.3 % — ABNORMAL HIGH (ref 0.0–0.2)

## 2020-12-30 LAB — URINALYSIS, COMPLETE (UACMP) WITH MICROSCOPIC
Bilirubin Urine: NEGATIVE
Glucose, UA: NEGATIVE mg/dL
Ketones, ur: NEGATIVE mg/dL
Nitrite: NEGATIVE
Protein, ur: 30 mg/dL — AB
Specific Gravity, Urine: 1.024 (ref 1.005–1.030)
pH: 5 (ref 5.0–8.0)

## 2020-12-30 LAB — COMPREHENSIVE METABOLIC PANEL
ALT: 27 U/L (ref 0–44)
AST: 82 U/L — ABNORMAL HIGH (ref 15–41)
Albumin: 2.1 g/dL — ABNORMAL LOW (ref 3.5–5.0)
Alkaline Phosphatase: 933 U/L — ABNORMAL HIGH (ref 38–126)
Anion gap: 7 (ref 5–15)
BUN: 18 mg/dL (ref 6–20)
CO2: 26 mmol/L (ref 22–32)
Calcium: 9.3 mg/dL (ref 8.9–10.3)
Chloride: 104 mmol/L (ref 98–111)
Creatinine, Ser: 0.68 mg/dL (ref 0.44–1.00)
GFR, Estimated: >60 mL/min (ref >60)
Glucose, Bld: 125 mg/dL — ABNORMAL HIGH (ref 70–99)
Potassium: 4.3 mmol/L (ref 3.5–5.1)
Sodium: 137 mmol/L (ref 135–145)
Total Bilirubin: 0.7 mg/dL (ref 0.3–1.2)
Total Protein: 6.6 g/dL (ref 6.5–8.1)

## 2020-12-30 NOTE — Progress Notes (Signed)
Call to [facility] one for clarification of molecular testing order and estimated date of results

## 2020-12-30 NOTE — Assessment & Plan Note (Addendum)
?   Tumor fever Hold abx for now, consider starting if recurrent or spike > 101 F 1 set of cultures from 11/21 with gram positive cocci with staph species (suspect contaminant) Repeat blood cultures pending Follow final cultures Urine culture pending Repeat CXR today with subsegmental atelectasis.

## 2020-12-30 NOTE — Progress Notes (Addendum)
HEMATOLOGY-ONCOLOGY PROGRESS NOTE  SUBJECTIVE: Received cycle 1 of FOLFOX on 12/27/2020.  Overall tolerated well.  She has not had any nausea or vomiting.  Pain better controlled this morning.  Did have a panic attack over the weekend but symptoms have now resolved.  PHYSICAL EXAMINATION:  Vitals:   12/30/20 0042 12/30/20 0608  BP:  (!) 162/89  Pulse: (!) 106 (!) 106  Resp:  20  Temp: 99.8 F (37.7 C) 100.1 F (37.8 C)  SpO2: 94% 93%   Filed Weights   12/16/20 1307 12/17/20 0841 12/22/20 0928  Weight: 127 kg 127 kg 127 kg    Intake/Output from previous day: 11/20 0701 - 11/21 0700 In: 237 [P.O.:237] Out: 1300 [Urine:1300]  LUNGS: clear anteriorly, no respiratory distress HEART: regular rate & rhythm and no murmurs and no lower extremity edema ABDOMEN: Soft, tender with firmness in the left lower abdomen NEURO: alert & oriented x 3 with fluent speech, no focal motor/sensory deficits Vascular: No leg edema  LABORATORY DATA:  I have reviewed the data as listed CMP Latest Ref Rng & Units 12/29/2020 12/28/2020 12/27/2020  Glucose 70 - 99 mg/dL 148(H) 146(H) 123(H)  BUN 6 - 20 mg/dL 24(H) 17 16  Creatinine 0.44 - 1.00 mg/dL 0.73 0.74 0.77  Sodium 135 - 145 mmol/L 135 136 136  Potassium 3.5 - 5.1 mmol/L 4.1 4.4 4.0  Chloride 98 - 111 mmol/L 103 104 106  CO2 22 - 32 mmol/L _0 Calcium 8.9 - 10.3 mg/dL 9.2 9.2 9.2  Total Protein 6.5 - 8.1 g/dL 6.1(L) 6.3(L) -  Total Bilirubin 0.3 - 1.2 mg/dL 0.5 0.4 -  Alkaline Phos 38 - 126 U/L 712(H) 714(H) -  AST 15 - 41 U/L 70(H) 45(H) -  ALT 0 - 44 U/L 36 17 -    Lab Results  Component Value Date   WBC 10.8 (H) 12/29/2020   HGB 7.6 (L) 12/29/2020   HCT 24.3 (L) 12/29/2020   MCV 95.3 12/29/2020   PLT 228 12/29/2020   NEUTROABS 8.8 (H) 12/29/2020    DG Chest 2 View  Result Date: 12/16/2020 CLINICAL DATA:  Shortness of breath in a 55 year old female. EXAM: CHEST - 2 VIEW COMPARISON:  July 10, 2009. FINDINGS: Linear  opacities in the LEFT chest of progressed slightly since the previous study. No lobar consolidation. Cardiomediastinal contours and hilar structures are stable. No sign of effusion. Mildly increased density in the subcarinal region on lateral projection. No acute skeletal process on limited assessment. IMPRESSION: Signs of scarring or atelectasis in the LEFT chest, slightly progressed since the previous study. Question developing subcarinal adenopathy, based on lateral projection. Developing lower lobe airspace disease is also a differential consideration. In a patient with pelvic masses on recent pelvic MRI would consider follow-up chest CT for further evaluation. Electronically Signed   By: Zetta Bills M.D.   On: 12/16/2020 14:13   CT CHEST W CONTRAST  Result Date: 12/17/2020 CLINICAL DATA:  Cancer of unknown primary.  Staging. EXAM: CT CHEST WITH CONTRAST TECHNIQUE: Multidetector CT imaging of the chest was performed during intravenous contrast administration. CONTRAST:  7m OMNIPAQUE IOHEXOL 350 MG/ML SOLN COMPARISON:  Chest CT dated 06/01/2007. FINDINGS: Cardiovascular: There is no cardiomegaly or pericardial effusion. Three-vessel coronary vascular calcification. Mild atherosclerotic calcification of the thoracic aorta. No aneurysmal dilatation. The origins of the great vessels of the aortic arch appear patent as visualized. There is dilatation of the main pulmonary trunk suggestive of pulmonary hypertension. Evaluation of the pulmonary  arteries is limited due to respiratory motion artifact and suboptimal opacification and timing of the contrast. No large or central pulmonary artery embolus identified. Mediastinum/Nodes: No hilar or mediastinal adenopathy. The esophagus is grossly unremarkable. Prior right hemithyroidectomy. No mediastinal fluid collection. Lungs/Pleura: Bibasilar subpleural atelectasis/scarring. There is no pleural effusion pneumothorax. The central airways are patent. Upper Abdomen:  Indeterminate 3 cm left renal nodule, present on the prior CT, likely a benign etiology such as adenoma. Musculoskeletal: Degenerative changes of the spine. No acute osseous pathology. IMPRESSION: 1. No acute intrathoracic pathology. No CT evidence of central pulmonary artery embolus. 2. Dilatation of the main pulmonary trunk suggestive of pulmonary hypertension. 3. Status post prior right hemithyroidectomy. 4. Bilateral linear atelectasis/scarring. 5. Aortic Atherosclerosis (ICD10-I70.0). Electronically Signed   By: Anner Crete M.D.   On: 12/17/2020 21:09   MR Lumbar Spine W Wo Contrast  Result Date: 12/16/2020 CLINICAL DATA:  Initial evaluation for low back pain, cancer suspected. EXAM: MRI LUMBAR SPINE WITHOUT AND WITH CONTRAST TECHNIQUE: Multiplanar and multiecho pulse sequences of the lumbar spine were obtained without and with intravenous contrast. CONTRAST:  46m GADAVIST GADOBUTROL 1 MMOL/ML IV SOLN COMPARISON:  Prior CT from earlier the same day. FINDINGS: Segmentation: Standard. Lowest well-formed disc space labeled the L5-S1 level. Alignment: 4 mm facet mediated anterolisthesis of L4 on L5. Alignment otherwise normal with preservation of the normal lumbar lordosis. Vertebrae: Diffusely abnormal appearance of the bone marrow is seen throughout the visualized lumbar spine and pelvis. Associated heterogeneous STIR hyperintensity with irregular heterogeneous postcontrast enhancement. Findings are highly suspicious for diffuse osseous metastatic disease. Involvement appears to be most pronounced within the T12 vertebral body as well as the visualized pelvis. Exact measurements of a discrete lesion is difficult given the overall infiltrative appearance of this finding. No associated pathologic fracture. No visible extra osseous extension of tumor at this time. Conus medullaris and cauda equina: Conus extends to the L1 level. Conus and cauda equina appear normal. No visible epidural or intracanalicular  tumor. Paraspinal and other soft tissues: Mild edema within the subcutaneous fat of the lower back, which could be related to overall volume status. Paraspinous soft tissues demonstrate no other acute finding. Retroperitoneal/iliac adenopathy noted. Left larger than right adnexal masses partially visualized. Findings better characterized on prior CT. Disc levels: L1-2:  Unremarkable. L2-3: Disc desiccation with mild disc bulge. Superimposed small right foraminal to extraforaminal disc protrusion (series 11, image 17). Mild facet hypertrophy. Underlying short pedicles with a degree of mild spinal stenosis. Mild bilateral L2 foraminal narrowing. L3-4: Disc desiccation with mild disc bulge. Superimposed shallow left extraforaminal disc protrusion with annular fissure (series 11, image 24). Mild facet hypertrophy. Underlying short pedicles. Mild spinal stenosis. Foramina remain patent. L4-5: 4 mm anterolisthesis. Disc desiccation with broad-based posterior pseudo disc bulge. Biforaminal annular fissures noted, left larger than right. Moderate bilateral facet arthrosis. Resultant moderate spinal stenosis. Mild to moderate bilateral L4 foraminal narrowing. L5-S1: Normal interspace. Mild right greater than left facet hypertrophy. No stenosis. IMPRESSION: 1. 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 No associated pathologic fracture or extra osseous extension of tumor at this time. 2. Retroperitoneal/iliac adenopathy with left larger than right adnexal masses, better characterized on prior CT. 3. Underlying multilevel degenerative spondylosis as above, most pronounced at L4-5 where there is resultant moderate spinal stenosis. Electronically Signed   By: BJeannine BogaM.D.   On: 12/16/2020 23:44   MR PELVIS  W WO CONTRAST  Result Date: 12/05/2020 CLINICAL DATA:  55 year old female with indeterminate  hypoechoic left adnexal mass identified in the setting of left lower quadrant pain. EXAM: MRI PELVIS WITHOUT AND WITH CONTRAST TECHNIQUE: Multiplanar multisequence MR imaging of the pelvis was performed both before and after administration of intravenous contrast. CONTRAST:  19m GADAVIST GADOBUTROL 1 MMOL/ML IV SOLN COMPARISON:  12/01/2020 pelvic sonogram and unenhanced CT abdomen/pelvis. FINDINGS: Urinary Tract: Mild diffuse bladder wall thickening. There is a poorly marginated enhancing solid 3.7 x 2.8 x 3.0 cm mass centered at the urethra (series 32/image 42 and series 30/image 67), extending from the vesicourethral junction throughout the length of the urethra. There appears to be an underlying right periurethral diverticulum (series 5/image 27). At the inferior tip of the urethra, there is a simple 0.9 x 0.9 x 0.9 cm cystic lesion compatible with a Skene's duct cyst. Bowel: Visualized small and large bowel are normal caliber with no bowel wall thickening. Vascular/Lymphatic: Mild bilateral inguinal lymphadenopathy measuring up to 1.4 cm short axis diameter on the right (series 19/image 71) and 1.4 cm on the left (series 19/image 70). Moderate bilateral external iliac lymphadenopathy measuring up to 1.7 cm bilaterally (series 19/image 53 on the right and 56 on the left). Bilateral common iliac lymphadenopathy, largest 1.6 cm on the right (series 19/image 27) and 1.7 cm on the left (series 19/image 14). Reproductive: Uterus: The anteverted uterus measures 8.2 x 3.7 x 5.5 cm. No uterine fibroids. Inner myometrium (junctional zone) measures 12 mm in thickness, which is compatible with mild diffuse uterine adenomyosis. Endometrium measures 4 mm in bilayer thickness, which is within normal limits. No endometrial cavity fluid or focal endometrial mass. Poorly marginated enhancing solid mass of the left uterine cervix measuring 2.4 x 2.3 x 3.0 cm (series 5/image 21 and series 8/image 22), which appears to disrupt the  normal cervical fibrous stroma without frank parametrial invasion. Ovaries and Adnexa: The right ovary measures 4.0 x 2.4 x 4.3 cm and contains multiple solid avidly enhancing masses, largest 2.3 x 2.0 cm (series 19/image 33). The left ovary measures 8.3 x 7.7 x 8.5 cm and is replaced by a predominantly solid avidly enhancing mass. Other: No abnormal free fluid in the pelvis. No focal pelvic fluid collection. Musculoskeletal: Diffuse patchy confluent nodular replacement of the pelvic osseous structures, best seen on axial series 7. IMPRESSION: 1. Poorly marginated enhancing 3.7 x 2.8 x 3.0 cm mass centered at the urethra, extending from the vesicourethral junction throughout the length of the female urethra, with probable underlying right periurethral diverticulum. This mass is concerning for malignancy. 2. Solid avidly enhancing bilateral ovarian masses, largest 2.3 cm on the right and 8.3 cm on the left, worrisome for bilateral ovarian metastases. 3. Poorly marginated enhancing 2.4 x 2.3 x 3.0 cm mass in the left uterine cervix, which appears to disrupt the normal cervical fibrous stroma without frank parametrial invasion, suspicious for malignancy. 4. Mild-to-moderate bilateral common iliac, bilateral external iliac and bilateral inguinal lymphadenopathy, suspicious for metastatic disease. 5. Diffuse patchy confluent nodular replacement of the pelvic osseous structures, suspicious for osseous metastatic disease. 6. Mild diffuse uterine adenomyosis. Electronically Signed   By: JIlona SorrelM.D.   On: 12/05/2020 20:31   [place]  Result Date: 12/16/2020 CLINICAL DATA:  Pelvic pain EXAM: TRANSABDOMINAL ULTRASOUND OF PELVIS DOPPLER ULTRASOUND OF OVARIES TECHNIQUE: Transabdominal ultrasound examination of the pelvis was performed including evaluation of the uterus, ovaries, adnexal regions, and pelvic cul-de-sac. Color and duplex Doppler  ultrasound was utilized to evaluate blood flow to the ovaries.  COMPARISON:  CT 12/16/2020, MRI 12/05/2020, pelvic ultrasound 12/01/2020 FINDINGS: Uterus Measurements: 6.8 x 3.4 x 5 cm = volume: 60.5 mL. No fibroids or other mass visualized. Endometrium Poorly visible, unable to measure. Right ovary Measurements: 4.8 x 2.9 x 5.5 cm = volume: 38.6 mL. Solid right adnexal mass replaces most of right ovary. Left ovary Measurements: 10.5 x 8.7 x 9.2 cm = volume: 440 mL. Enlarged left ovary with heterogenous solid mass that replaces the left ovary. Previously the left ovary measured 8.3 x 7.7 x 8.5 cm on MRI. Pulsed Doppler evaluation demonstrates normal arterial and venous waveforms in both or adnexa, though note that both ovaries are essentially replaced by mass lesions. Other: No significant ascites. Foley catheter within the bladder which appears somewhat thick walled. IMPRESSION: 1. Very limited exam secondary to habitus and only trans abdominal technique 2. Bilateral solid adnexal masses essentially replacing the ovaries which thereby limits assessment of the ovaries. Left adnexal solid mass lesion has significantly increased in size; documented flow in the adnexa relates to flow within the solid adnexal masses. Gynecology follow-up recommended. Electronically Signed   By: Donavan Foil M.D.   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 EXAM: CT ABDOMEN AND PELVIS WITH CONTRAST TECHNIQUE: Multidetector CT imaging of the abdomen and pelvis was performed using the standard protocol following bolus administration of intravenous contrast. CONTRAST:  82m OMNIPAQUE IOHEXOL 350 MG/ML SOLN COMPARISON:  Comparison is made with multiple prior studies which were performed recently, most recent comparison is an MRI of the pelvis of December 05, 2020. FINDINGS: Lower chest: Basilar atelectasis. Subcarinal region not well evaluated. No  consolidation or effusion at the lung bases. Hepatobiliary: No focal, suspicious hepatic lesion. No pericholecystic stranding. Portal vein is patent. No biliary duct dilation. Pancreas: Pancreas with normal contours. No signs of adjacent inflammation. Spleen: Spleen normal size and contour. Adrenals/Urinary Tract: Well-circumscribed LEFT adrenal lesion, density value of 19 Hounsfield units on previous noncontrast imaging measuring 3.2 x 2.6 cm., relative washout is indeterminate at 30%. Normal RIGHT adrenal. Normal, symmetric enhancement of bilateral kidneys without focal renal lesion. Stomach/Bowel: Small hiatal hernia. No stranding adjacent to the stomach. No sign of small bowel obstruction or acute small bowel process. The appendix is normal. Stool in various parts of the colon without signs of obstruction or adjacent stranding. Vascular/Lymphatic: Retroperitoneal adenopathy (image 50/2) 14 mm lymph node previously 11 mm along the LEFT common iliac chain. LEFT pelvic sidewall lymph node (image 75/2) 14 mm short axis, previously 13 mm. Similar size of RIGHT pelvic sidewall/external iliac lymph nodes also with little change compared to the most recent comparison imaging. Reproductive: Foley catheter in situ. Uterus and cervix not well at assess nor are the masses within the area of the cervix and urethra seen on previous MRI. Increasing size of LEFT ovary with heterogeneous appearance now measuring 10 x 8.8 cm. When assessed on the study of October 20, 2020 the ovary for reference measured 3.7 x 2.5 cm. RIGHT ovary with area of enhancement shows enlargement as well but not as pronounced as on previous imaging with 2.8 cm area of enhancement in the RIGHT ovary as the largest discrete area which is similar to the recent MRI evaluation. As compared to the study of October 20, 2020 there is little change  in the appearance of the RIGHT ovary aside from this area though there is an increasingly nodular appearance of  the ovary in this location. Mass posterior to the LEFT ovary just above the uterus measuring 4.4 x 2.8 cm has enlarged from 2.5 cm. Other: Small volume ascites. Musculoskeletal: Heterogeneous pattern associated with the spine in terms of density raises the question of metastatic disease without discrete measurable focal lesion in the spine. For example a geographic area of sclerosis on image 101/5) in the anterior L3 vertebral body does raise the question of underlying lesion with numerous additional foci, essentially nearly all levels of the spine with some degree of heterogeneity. Another example of this finding is noted on the RIGHT at the T10 level. No acute bone finding or frankly destructive bone process. IMPRESSION: Rapid increase in size of the LEFT ovary with some adjacent ascites. The rapid enlargement is seen in the setting of generalized worsening of masses associated with the LEFT and RIGHT ovary but perhaps more pronounced than other areas in the pelvis. The possibility of torsion associated with an ovarian mass could be considered based on this finding. Would correlate with any worsening abdominal pain and with sonogram as warranted for further assessment. Enlarging masses elsewhere though not to the extent that is seen with the LEFT ovary. Masslike area of the cervix and potentially within a urethral diverticulum not as well seen as on the recent MRI evaluation. Small volume ascites. Well-circumscribed LEFT adrenal lesion measuring 3.2 x 2.6 cm, density value of 19 Hounsfield units on previous noncontrast imaging. Relative washout is indeterminate at 30%. This may represent an adrenal adenoma. Consider dedicated adrenal protocol CT or attention on PET evaluation if performed. Heterogeneous pattern of subtle sclerosis and lucency in the spine on today's study raising the question of bony metastatic disease in the spine. Findings of increased ovarian size on the LEFT out of proportion other areas were  called by telephone at the time of interpretation on 12/16/2020 at 5:44 pm to provider Theodis Blaze , who verbally acknowledged these results. Electronically Signed   By: Zetta Bills M.D.   On: 12/16/2020 17:46   [place] Art/Ven Flow Abd Pelv Doppler  Result Date: 12/16/2020 CLINICAL DATA:  Pelvic pain EXAM: TRANSABDOMINAL ULTRASOUND OF PELVIS DOPPLER ULTRASOUND OF OVARIES TECHNIQUE: Transabdominal ultrasound examination of the pelvis was performed including evaluation of the uterus, ovaries, adnexal regions, and pelvic cul-de-sac. Color and duplex Doppler ultrasound was utilized to evaluate blood flow to the ovaries. COMPARISON:  CT 12/16/2020, MRI 12/05/2020, pelvic ultrasound 12/01/2020 FINDINGS: Uterus Measurements: 6.8 x 3.4 x 5 cm = volume: 60.5 mL. No fibroids or other mass visualized. Endometrium Poorly visible, unable to measure. Right ovary Measurements: 4.8 x 2.9 x 5.5 cm = volume: 38.6 mL. Solid right adnexal mass replaces most of right ovary. Left ovary Measurements: 10.5 x 8.7 x 9.2 cm = volume: 440 mL. Enlarged left ovary with heterogenous solid mass that replaces the left ovary. Previously the left ovary measured 8.3 x 7.7 x 8.5 cm on MRI. Pulsed Doppler evaluation demonstrates normal arterial and venous waveforms in both or adnexa, though note that both ovaries are essentially replaced by mass lesions. Other: No significant ascites. Foley catheter within the bladder which appears somewhat thick walled. IMPRESSION: 1. Very limited exam secondary to habitus and only trans abdominal technique 2. Bilateral solid adnexal masses essentially replacing the ovaries which thereby limits assessment of the ovaries. Left adnexal solid mass lesion has significantly increased in size; documented flow  in the adnexa relates to flow within the solid adnexal masses. Gynecology follow-up recommended. Electronically Signed   By: Donavan Foil M.D.   On: 12/16/2020 19:23   CT Renal Stone Study  Result Date:  12/01/2020 CLINICAL DATA:  Flank pain.  Evaluate for kidney stone. EXAM: CT ABDOMEN AND PELVIS WITHOUT CONTRAST TECHNIQUE: Multidetector CT imaging of the abdomen and pelvis was performed following the standard protocol without IV contrast. COMPARISON:  10/20/2020 FINDINGS: Lower chest: Subsegmental atelectasis versus scar is identified within both lung bases. Hepatobiliary: Mild hepatic steatosis. No focal liver abnormality identified. Gallbladder unremarkable. Pancreas: Unremarkable. No pancreatic ductal dilatation or surrounding inflammatory changes. Spleen: Normal in size without focal abnormality. Right adrenal gland appears normal. Left adrenal mass measures 3.3 x 2.5 cm. Unchanged from 06/02/2016 compatible with a benign adrenal adenoma. Adrenals/Urinary Tract: No kidney stones are identified bilaterally. No signs of hydronephrosis or hydroureter. Mild circumferential wall thickening of the urinary bladder is identified. Stomach/Bowel: Stomach appears normal. The appendix is visualized and appears normal. Moderate stool burden is identified within the colon up to the level of the rectum. No bowel wall thickening, inflammation, or distension. Vascular/Lymphatic: Aortic atherosclerosis.  No aneurysm. Signs of retroperitoneal and pelvic adenopathy, including: 1.1 cm left periaortic node, image 52/2. Increased from 1 cm previously. Left external iliac lymph node measures 1.1 cm, image 71/2. previously 7 mm. Right pelvic sidewall lymph node measures 1.1 cm. This is unchanged from previous exam. Within the right inguinal region there is a 1.3 cm lymph node, image 85/2. Previously 0.9 cm. Left inguinal region there is a 1.4 cm node, image 84/2. Formally 1 cm. Reproductive: There is increased soft tissue fullness in the region of the cervix which appears unchanged from 10/20/2020. This measures 4.9 x 3.5 cm, image 76/2. Marked interval enlargement of left ovary. The left ovary has a masslike appearance measuring 8.4  x 6.6 cm, image 66/2. Formally this measured 4.5 x 2.7 cm. The right ovary measures 4.0 by 3.6 cm, image 66/2. Formally 4.6 by 2.7 cm. Other: No ascites or focal fluid collections. No signs of peritoneal nodularity. Small periumbilical hernia contains fat only. Supraumbilical fat containing hernia measures 3.3 cm, image 67/6. Infraumbilical hernia also contains fat only measuring 3.4 cm, image 70/6. Fat containing left inguinal hernia noted, small. Musculoskeletal: No acute or suspicious osseous findings. IMPRESSION: 1. No urinary tract calculi identified. 2. Marked interval enlargement of left ovary with masslike appearance. This is new finding compared with 10/20/2020. Given the short interval findings are favored to represent an acute process such as a hemorrhagic cyst. Tubo-ovarian abscess is less favored but not entirely excluded. Malignancy is also a differential consideration but considered less likely given the short interval from 10/20/2020. Initial workup with transabdominal/transvaginal pelvic sonogram is recommended. If the pelvic ultrasound is inconclusive then contrast enhanced pelvic MRI may be considered when the patient is clinically stable, is able to follow instructions, and remain motionless. Ideally this should be performed on a nonemergent basis. 3. Soft tissue fullness in the region of the cervix is again noted and appears similar to previous exam. Etiology is indeterminate. Advise correlation with cervical cancer screening. 4. Retroperitoneal and pelvic adenopathy identified. In the setting of infection this may reflect reactive adenopathy. Differential considerations include metastatic disease as well as lymphoproliferative disorders. 5. Multiple fat containing ventral abdominal wall and left inguinal hernias. 6. Hepatic steatosis. 7. Stable left adrenal adenoma. 8. Aortic Atherosclerosis (ICD10-I70.0). Electronically Signed   By: Queen Slough.D.  On: 12/01/2020 06:43   US PELVIC  COMPLETE W TRANSVAGINAL AND TORSION R/O  Result Date: 12/01/2020 CLINICAL DATA:  LLQ Pain EXAM: TRANSABDOMINAL AND TRANSVAGINAL ULTRASOUND OF PELVIS TECHNIQUE: Both transabdominal and transvaginal ultrasound examinations of the pelvis were performed. Transabdominal technique was performed for global imaging of the pelvis including uterus, ovaries, adnexal regions, and pelvic cul-de-sac. It was necessary to proceed with endovaginal exam following the transabdominal exam to visualize the endometrium and uterus. COMPARISON:  December 01, 2020, September eleventh 2022 FINDINGS: Uterus Uterus is only visualized transvaginally and is suboptimally assessed due to patient incomplete voiding and body habitus. Measurements: 8.8 x 3.6 x 5.0 cm = volume: 84 mL. Bulkiness of the cervix. Endometrium Poorly visualized due to distension of the bladder on transvaginal imaging. Right ovary Not visualized transabdominally or transvaginally. Left adnexal mass: Adnexal mass is labeled as uterus on transabdominal imaging. It measures 9.5 x 6.9 x 7.5 cm for a volume of 260 ML. It is predominately hypoechoic. There are internal anechoic spaces consistent with cystic density within the mass. This corresponds to the mass seen on CT. Doppler imaging was not performed of this mass, limiting evaluation for blood flow. Other findings No abnormal free fluid. IMPRESSION: 1. There is a hypoechoic LEFT adnexal mass which measures approximately 9.5 cm with central internal cystic density. Given newly conspicuous appearance since September 2022, this may reflect a hemorrhagic cyst or sequela of torsed ovary. Recommend GYN consult, and consideration of pelvis MRI w/o and w/ contrast if clinically warranted. Note: This recommendation does not apply to premenarchal patients or to those with increased risk (genetic, family history, elevated tumor markers or other high-risk factors) of ovarian cancer. Reference: Radiology 2019 Nov; 293(2):359-371. 2.  Bulkiness of the cervix, suboptimally assessed with current technique. Recommend correlation with physical exam. Electronically Signed   By: Valentino Saxon M.D.   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 EXAM: IMPLANTED PORT A CATH PLACEMENT WITH ULTRASOUND AND FLUOROSCOPIC GUIDANCE COMPARISON:  None. MEDICATIONS: None. ANESTHESIA/SEDATION: Moderate (conscious) sedation was employed during this procedure. A total of Versed 3 mg and Fentanyl 100 mcg was administered intravenously. Moderate Sedation Time: 19 minutes. The patient's level of consciousness and vital signs were monitored continuously by radiology nursing throughout the procedure under my direct supervision. CONTRAST:  None FLUOROSCOPY TIME:  0 minutes, 12 seconds (5 mGy) COMPLICATIONS: None immediate. PROCEDURE: The procedure, risks, benefits, and alternatives were explained to the patient. Questions regarding the procedure were encouraged and answered. The patient understands and consents to the procedure. The right neck and chest were prepped with chlorhexidine in a sterile fashion, and a sterile drape was applied covering the operative field. Maximum barrier sterile technique with sterile gowns and gloves were used for the procedure. A timeout was performed prior to the initiation of the procedure. Ultrasound was used to examine the jugular vein which was compressible and free of internal echoes. A skin marker was used to demarcate the planned venotomy and port pocket incision sites. Local anesthesia was provided to these sites and the subcutaneous tunnel track with 1% lidocaine with 1:100,000 epinephrine. A small incision was created at the jugular access site and blunt dissection was performed of the subcutaneous tissues. Under ultrasound guidance, the  jugular vein was accessed with a 21 ga micropuncture needle and an 0.018" wire was inserted to the superior vena  cava. Real-time ultrasound guidance was utilized for vascular access including the acquisition of a permanent ultrasound image documenting patency of the accessed vessel. A 5 Fr micopuncture set was then used, through which a 0.035" Rosen wire was passed under fluoroscopic guidance into the inferior vena cava. An 8 Fr dilator was then placed over the wire. A subcutaneous port pocket was then created along the upper chest wall utilizing a combination of sharp and blunt dissection. The pocket was irrigated with sterile saline, packed with gauze, and observed for hemorrhage. A single lumen "ISP" sized power injectable port was chosen for placement. The 8 Fr catheter was tunneled from the port pocket site to the venotomy incision. The port was placed in the pocket. The external catheter was trimmed to appropriate length. The dilator was exchanged for an 8 Fr peel-away sheath under fluoroscopic guidance. The catheter was then placed through the sheath and the sheath was removed. Final catheter positioning was confirmed and documented with a fluoroscopic spot radiograph. The port was accessed with a Huber needle, aspirated, and flushed with heparinized saline. The deep dermal layer of the port pocket incision was closed with interrupted 3-0 Vicryl suture. The skin was opposed with a running subcuticular 4-0 Monocryl suture. Dermabond was then placed over the port pocket and neck incisions. The patient tolerated the procedure well without immediate post procedural complication. FINDINGS: After catheter placement, the tip lies within the superior cavoatrial junction. The catheter aspirates and flushes normally and is ready for immediate use. IMPRESSION: Successful placement of a power injectable Port-A-Cath via the right internal jugular vein. The catheter is ready for immediate use. Ruthann Cancer, MD [facility] Radiology Electronically Signed   By: Ruthann Cancer M.D.   On: 12/26/2020 11:08   VAS [place] LOWER EXTREMITY VENOUS (DVT)  Result Date: 12/29/2020  Lower Venous DVT Study Patient Name:  Erin [name]  Date of Exam:   12/29/2020 Medical Rec #: [id number]            Accession #:    [id number] Date of Birth: 06/14/1965            Patient Gender: F Patient Age:   55 years Exam Location:  [facility] Procedure:      VAS [place] LOWER EXTREMITY VENOUS (DVT) Referring Phys: A POWELL JR --------------------------------------------------------------------------------  Indications: Pain.  Risk Factors: Cancer. Limitations: Body habitus and poor ultrasound/tissue interface. Comparison Study: No prior studies. Performing Technologist: Oliver Hum RVT  Examination Guidelines: A complete evaluation includes B-mode imaging, spectral Doppler, color Doppler, and power Doppler as needed of all accessible portions of each vessel. Bilateral testing is considered an integral part of a complete examination. Limited examinations for reoccurring indications may be performed as noted. The reflux portion of the exam is performed with the patient in reverse Trendelenburg.  +---------+---------------+---------+-----------+----------+--------------+ RIGHT    CompressibilityPhasicitySpontaneityPropertiesThrombus Aging +---------+---------------+---------+-----------+----------+--------------+ CFV      Full           Yes      Yes                                 +---------+---------------+---------+-----------+----------+--------------+ SFJ      Full                                                        +---------+---------------+---------+-----------+----------+--------------+  FV Prox  Full                                                        +---------+---------------+---------+-----------+----------+--------------+ FV Mid   Full                                                         +---------+---------------+---------+-----------+----------+--------------+ FV DistalFull                                                        +---------+---------------+---------+-----------+----------+--------------+ PFV      Full                                                        +---------+---------------+---------+-----------+----------+--------------+ POP      Full           Yes      Yes                                 +---------+---------------+---------+-----------+----------+--------------+ PTV      Full                                                        +---------+---------------+---------+-----------+----------+--------------+ PERO     Full                                                        +---------+---------------+---------+-----------+----------+--------------+   +---------+---------------+---------+-----------+----------+--------------+ LEFT     CompressibilityPhasicitySpontaneityPropertiesThrombus Aging +---------+---------------+---------+-----------+----------+--------------+ CFV      Full           Yes      Yes                                 +---------+---------------+---------+-----------+----------+--------------+ SFJ      Full                                                        +---------+---------------+---------+-----------+----------+--------------+ FV Prox  Full                                                        +---------+---------------+---------+-----------+----------+--------------+  FV Mid   Full                                                        +---------+---------------+---------+-----------+----------+--------------+ FV DistalFull                                                        +---------+---------------+---------+-----------+----------+--------------+ PFV      Full                                                         +---------+---------------+---------+-----------+----------+--------------+ POP      Full           Yes      Yes                                 +---------+---------------+---------+-----------+----------+--------------+ PTV      Full                                                        +---------+---------------+---------+-----------+----------+--------------+ PERO     Full                                                        +---------+---------------+---------+-----------+----------+--------------+     Summary: RIGHT: - There is no evidence of deep vein thrombosis in the lower extremity.  - No cystic structure found in the popliteal fossa.  LEFT: - There is no evidence of deep vein thrombosis in the lower extremity.  - No cystic structure found in the popliteal fossa.  *See table(s) above for measurements and observations. Electronically signed by Jamelle Haring on 12/29/2020 at 12:00:16 PM.    Final     ASSESSMENT AND PLAN: 1.  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 -12/05/2020 MRI of the pelvis-poorly marginated enhancing 3.7 x 2.8 x 3.0 cm mass centered at the urethra, solid avidly enhancing bilateral ovarian masses, poorly marginated enhancing 2.4 x 2.3 x 3.0 cm mass in the left uterine cervix, mild to moderate bilateral common iliac, bilateral external iliac, and bilateral inguinal lymphadenopathy, diffuse patchy confluent nodular replacement of the pelvic osseous structures. -12/13/2020 CEA 1864, CA125 29.2 -12/16/2020 CT abdomen/pelvis-rapidly increasing size of the left ovary with some adjacent ascites, enlarging masses elsewhere, masslike area of the cervix and potentially within a urethral diverticulum, well-circumscribed left adrenal lesion measuring 3.2 x 2.6 cm, heterogeneous pattern of subtle sclerosis and lucency in the spine. -12/16/2020 MRI of the lumbar spine-diffusely abnormal appearance of the bone marrow throughout the  visualized lumbar spine and pelvis highly suspicious for diffuse osseous metastatic disease,  retroperitoneal/iliac adenopathy with left larger than right adnexal masses. 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  Abdominal pain secondary #1 3.  Constipation 4.  Normocytic anemia 5.  Leukocytosis 6.  Protein calorie malnutrition 7.  Obstructive sleep apnea 8.  Diabetes mellitus 9.  Hypertension 10.  Possible pulmonary hypertension noted on CT chest  Ms. Eoff appears stable.  She received her first cycle of FOLFOX on 12/27/2020 and tolerated it well.  Her pain is better controlled this morning and she is not having any nausea or vomiting.  We will be discharging to CIR possibly today if bed is available.  She is due for her next cycle of chemotherapy around 01/13/2021.  We will schedule this as an outpatient.  Will consider PET scan as an outpatient once she is done with inpatient rehabilitation.  The tumor has been submitted for molecular testing, HER2, and PD-L1 testing and we will follow-up on these results.  Recommendations: 1.  Continue current pain medications. 2.  Plan for outpatient PET scan once she is done with rehab. 3.  We will follow-up on molecular testing, HER2, and PD-L1 results. 4.  Okay to discharge to CIR today from our standpoint. 5.  We will arrange for outpatient follow-up in about 2 weeks for cycle #2 of FOLFOX. 6.  Decrease blood draws, no need for daily CBC  Future Appointments  Date Time Provider [place]  02/18/2021 10:45 AM [name], PA-C [facility]       LOS: 14 days   [name] 12/30/20 Ms. Gift was interviewed and examined.  She completed cycle 1 FOLFOX beginning 12/27/2020.  She tolerated the chemotherapy without significant acute toxicity.  Her pain appears improved today.  We are waiting on results of molecular testing to determine future treatment options.  Outpatient follow-up will be scheduled at the Cancer center for cycle 2 FOLFOX.  I will follow her while on the rehab service.  She has a low-grade fever, potentially related to "tumor "fever.  The persistent pyuria is likely related to tumor involving the GU tract.  I was present for greater than 50% of today's visit.  I performed medical decision making.

## 2020-12-30 NOTE — PMR Pre-admission (Shared)
PMR Admission Coordinator Pre-Admission Assessment  Patient: Erin [name] is an 55 y.o., female MRN: [id number] DOB: 08/16/1965 Height: 5\' 4"  (162.6 cm) Weight: 127 kg  Insurance Information HMO:     PPO:      PCP:      IPA:      80/20:      OTHER:  PRIMARY: Uninsured      Policy#:       Subscriber:  CM Name:       Phone#:      Fax#:  Pre-Cert#:       Employer:  Benefits:  Phone #:      Name:  Eff. Date:      Deduct:       Out of Pocket Max:       Life Max:  CIR:       SNF:  Outpatient:      Co-Pay:  Home Health:       Co-Pay:  DME:      Co-Pay:  Providers:  SECONDARY:       Policy#:      Phone#:   Development worker, community:       Phone#:   [employer] for patients in Inpatient Rehabilitation Facilities with attached Privacy Act [place] Records was provided and verbally reviewed with: N/A  Emergency Contact Information Contact Information     Name Relation Home Work Mobile   [place] Niece   [phone]   [place],Phyllis Other   [place] Other   [phone]       Current Medical History  Patient Admitting Diagnosis: debility 2/2 new diagnosis of metastatic cancer  History of Present Illness: Kawanda Drumheller is a 55 year old right-handed female with history of OSA on CPAP hypertension diabetes mellitus morbid obesity with BMI 48.06, hyperlipidemia, quit smoking 21 years ago as well as recently diagnosed mass in her urethra September 2022, bilateral ovarian masses, paracervical masses and diffuse findings concerning for bony metastasis in her pelvis on pelvic MRI 10/27.  She was also given a Foley catheter tube for retention at that time and was to follow-up with urology 11/15.   Presented 12/16/2020 with on and off lower abdominal pain recently prescribed oxycodone per GYN/ONC Dr. Berline Lopes as well as reported severe pain down both legs and numbness with tingling.  She does report a 13 pound weight loss  over the past several weeks.  Noted elevated CEA 1864, Ca1 25 on admission.  CT of the abdomen/pelvis showed rapid increase in size of left ovarian mass with some ascites along with lucency in the spine concerning for bony metastasis.  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  Underwent EGD/colonoscopy per Dr.Ciriglano showing a sending polyp negative for high-grade dysplasia, 3 sigmoid polyps negative for high-grade dysplasia.  Gastric nodule biopsy notable for adenocarcinoma poorly differentiated with signet ring morphology.  Focal invasion of muscularis mucosa.  Per path report this most likely represented primary source of metastatic carcinoma diagnosis on her biopsy of vagina 11/8.  She continues to be followed by medical oncology Dr. Betsy Coder as well as GYN with plan outpatient PET study as well as initiate systemic therapy .  She has undergone a single-lumen PowerPort placement 12/26/2020.  Hospital course her Foley catheter tube was removed follow-up urology services voiding without difficulty.  Acute on chronic anemia latest hemoglobin 7.6.  Palliative care was  consulted to establish goals of care.  She was cleared to begin Lovenox for DVT prophylaxis.  Therapy evaluations completed due to patient decreased functional mobility was recommended for a comprehensive rehab program.    Patient's medical record from [facility] has been reviewed by the rehabilitation admission coordinator and physician.  Past Medical History  Past Medical History:  Diagnosis Date   Anxiety    Constipation    Depression    GERD (gastroesophageal reflux disease)    Pt states she no longer needs meds   Hyperlipidemia    Hypertension    Menorrhagia    OSA (obstructive sleep apnea) 11/25/2017   OSA on CPAP    Other fatigue    Shortness of breath on exertion     Type 2 diabetes mellitus ([place])    Vitamin D deficiency    Wears glasses     Has the patient had major surgery during 100 days prior to admission? No  Family History   family history includes Alcoholism in her father; Breast cancer in her maternal aunt; Diabetes in her half-sister and mother; Glaucoma in her father; Heart disease in her paternal grandmother; Hypertension in her half-sister and mother; Obesity in her mother; Prostate cancer in her maternal grandfather; Renal cancer in her maternal uncle; Stroke in her mother.  Current Medications  Current Facility-Administered Medications:    [COMPLETED] acetaminophen (TYLENOL) tablet 1,000 mg, 1,000 mg, Oral, Q8H, 1,000 mg at 12/28/20 1509 **FOLLOWED BY** acetaminophen (TYLENOL) tablet 650 mg, 650 mg, Oral, Q6H PRN, Elodia Florence., MD, 650 mg at 12/30/20 0704   amLODipine (NORVASC) tablet 5 mg, 5 mg, Oral, Daily, Cirigliano, Vito V, DO, 5 mg at 12/30/20 1026   carvedilol (COREG) tablet 6.25 mg, 6.25 mg, Oral, BID WC, Cirigliano, Vito V, DO, 6.25 mg at 12/30/20 5465   Chlorhexidine Gluconate Cloth 2 % PADS 6 each, 6 each, Topical, Daily, Elodia Florence., MD, 6 each at 12/30/20 1026   cyclobenzaprine (FLEXERIL) tablet 5 mg, 5 mg, Oral, TID PRN, Elodia Florence., MD   diclofenac Sodium (VOLTAREN) 1 % topical gel 2 g, 2 g, Topical, QID PRN, Elodia Florence., MD, 2 g at 12/28/20 2129   enoxaparin (LOVENOX) injection 60 mg, 60 mg, Subcutaneous, Q24H, Cirigliano, Vito V, DO, 60 mg at 12/29/20 2117   feeding supplement (ENSURE ENLIVE / ENSURE PLUS) liquid 237 mL, 237 mL, Oral, BID BM, Dwyane Dee, MD, 237 mL at 12/27/20 1453   gabapentin (NEURONTIN) capsule 300 mg, 300 mg, Oral, QHS, Elodia Florence., MD, 300 mg at 12/29/20 2116   insulin aspart (novoLOG) injection 0-9 Units, 0-9 Units, Subcutaneous, TID WC, Cirigliano, Vito V, DO, 1 Units at 12/30/20 1231   iohexol (OMNIPAQUE) 350 MG/ML injection 60 mL, 60 mL,  Intravenous, Once PRN, Cirigliano, Vito V, DO   lactulose (CHRONULAC) 10 GM/15ML solution 20 g, 20 g, Oral, BID PRN, Dwyane Dee, MD   LORazepam (ATIVAN) injection 0.5 mg, 0.5 mg, Intravenous, Q4H PRN, Elodia Florence., MD   oxyCODONE (Oxy IR/ROXICODONE) immediate release tablet 15 mg, 15 mg, Oral, Q4H PRN, Elodia Florence., MD, 15 mg at 12/30/20 0704   oxyCODONE (OXYCONTIN) 12 hr tablet 40 mg, 40 mg, Oral, Q12H, Girguis, Shanon Brow, MD, 40 mg at 12/30/20 1026   pantoprazole (PROTONIX) EC tablet 40 mg, 40 mg, Oral, BID, Dwyane Dee, MD, 40 mg at 12/30/20 1026   polyethylene glycol (MIRALAX / GLYCOLAX) packet 17 g, 17 g, Oral,  Daily, Dwyane Dee, MD, 17 g at 12/30/20 1026   prochlorperazine (COMPAZINE) tablet 10 mg, 10 mg, Oral, Q6H PRN, Ladell Pier, MD   senna-docusate (Senokot-S) tablet 1 tablet, 1 tablet, Oral, BID, Cross, Melissa D, NP, 1 tablet at 12/30/20 1026  Patients Current Diet:  Diet Order             Diet regular Room service appropriate? Yes; Fluid consistency: Thin  Diet effective now                   Precautions / Restrictions Precautions Precautions: Fall Precaution Comments: METS pelvis and spine Restrictions Weight Bearing Restrictions: No   Has the patient had 2 or more falls or a fall with injury in the past year? No  Prior Activity Level Community (5-7x/wk): independent PTA, driving, working  Prior Functional Level Self Care: Did the patient need help bathing, dressing, using the toilet or eating? Independent  Indoor Mobility: Did the patient need assistance with walking from room to room (with or without device)? Independent  Stairs: Did the patient need assistance with internal or external stairs (with or without device)? Independent  Functional Cognition: Did the patient need help planning regular tasks such as shopping or remembering to take medications? Independent  Patient Information Are you of Hispanic, Latino/a,or  Spanish origin?: A. No, not of Hispanic, Latino/a, or Spanish origin What is your race?: B. Black or African American Do you need or want an interpreter to communicate with a doctor or health care staff?: 0. No  Patient's Response To:  Health Literacy and Transportation Is the patient able to respond to health literacy and transportation needs?: Yes Health Literacy - How often do you need to have someone help you when you read instructions, pamphlets, or other written material from your doctor or pharmacy?: Never In the past 12 months, has lack of transportation kept you from medical appointments or from getting medications?: No In the past 12 months, has lack of transportation kept you from meetings, work, or from getting things needed for daily living?: No  Development worker, international aid / [place] Devices/Equipment: Eyeglasses, CPAP (using golf club as a cane,) Home Equipment: Other (comment) (uses golf club as a cane)  Prior Device Use: Indicate devices/aids used by the patient prior to current illness, exacerbation or injury? None of the above  Current Functional Level Cognition  Overall Cognitive Status: Within Functional Limits for tasks assessed Orientation Level: Oriented X4 (forgetful at times) General Comments: AxO x 3 feeling "so much pain" and worried about "what's to come" starting CHEMO    Extremity Assessment (includes Sensation/Coordination)  Upper Extremity Assessment: Defer to OT evaluation  Lower Extremity Assessment: Generalized weakness    ADLs       Mobility  Overal bed mobility: Needs Assistance Bed Mobility: Supine to Sit, Sit to Supine Supine to sit: Max assist Sit to supine: Max assist, Total assist General bed mobility comments: sitting in recliner    Transfers  Overall transfer level: Needs assistance Equipment used: Rolling walker (2 wheels) Transfers: Sit to/from Stand, Bed to chair/wheelchair/BSC Sit to Stand: Min assist, From elevated  surface Bed to/from chair/wheelchair/BSC transfer type:: Stand pivot Stand pivot transfers: Min assist, +2 physical assistance General transfer comment: assisted OOB to [facility] then back to bed with 50% VC's on proper hand placement and safety with turns.    Ambulation / Gait / Stairs / Wheelchair Mobility  Ambulation/Gait Ambulation/Gait assistance: Counsellor (Feet): 12 Feet (12  ft X 2 to commode) Assistive device: Rolling walker (2 wheels) Gait Pattern/deviations: Step-to pattern, Decreased stride length, Shuffle, Trunk flexed General Gait Details: increased pain and difficulty advancing L LE, increased effort and fatigue with gait, no LOB just limited by pain and fatigue Gait velocity: slow    Posture / Balance Dynamic Sitting Balance Sitting balance - Comments: Sitting EOB 10 mins prior to ambulation due to pain and performed ADLs (washing back) Balance Overall balance assessment: No apparent balance deficits (not formally assessed) Sitting-balance support: Feet supported Sitting balance-Leahy Scale: Good Sitting balance - Comments: Sitting EOB 10 mins prior to ambulation due to pain and performed ADLs (washing back) Standing balance support: Bilateral upper extremity supported, During functional activity Standing balance-Leahy Scale: Fair Standing balance comment: reliant on BUE support    Special needs/care consideration Diabetic management yes and Special service needs new cancer (Oncology f/u)   Previous Home Environment (from acute therapy documentation) Living Arrangements: Alone Available Help at Discharge: Friend(s), Available PRN/intermittently Type of Home: Apartment Home Layout: One level Home Access: Stairs to enter Entrance Stairs-Rails: None Entrance Stairs-Number of Steps: 4 no rails Bathroom Shower/Tub: Chiropodist: [place]: No  Discharge Living Setting Plans for Discharge Living Setting: Patient's  home Type of Home at Discharge: Apartment Discharge Home Layout: One level Discharge Home Access: Stairs to enter Entrance Stairs-Rails: None Entrance Stairs-Number of Steps: 4-5 Discharge Bathroom Shower/Tub: Tub/shower unit Discharge Bathroom Toilet: Standard Discharge Bathroom Accessibility: Yes How Accessible: Accessible via walker Does the patient have any problems obtaining your medications?: No  Social/Family/Support Systems Anticipated Caregiver: none; mod I goals Anticipated Caregiver's Contact Information: n/a Ability/Limitations of Caregiver: n/a Discharge Plan Discussed with Primary Caregiver: Yes (patient) Is Caregiver In Agreement with Plan?: Yes Does Caregiver/Family have Issues with Lodging/Transportation while Pt is in Rehab?: No  Goals Patient/Family Goal for Rehab: PT/OT mod I, SLP n/a Expected length of stay: 6-9 days Pt/Family Agrees to Admission and willing to participate: Yes Program Orientation Provided & Reviewed with Pt/Caregiver Including Roles  & Responsibilities: Yes  Barriers to Discharge: Lack of/limited family support, Insurance for SNF coverage, Pending chemo/radiation  Decrease burden of Care through IP rehab admission: n/a  Possible need for SNF placement upon discharge: NO  Patient Condition: I have reviewed medical records from [facility], spoken with CM, and patient. I discussed via phone for inpatient rehabilitation assessment.  Patient will benefit from ongoing PT and OT, can actively participate in 3 hours of therapy a day 5 days of the week, and can make measurable gains during the admission.  Patient will also benefit from the coordinated team approach during an Inpatient Acute Rehabilitation admission.  The patient will receive intensive therapy as well as Rehabilitation physician, nursing, social worker, and care management interventions.  Due to bladder management, bowel management, safety, skin/wound care, disease management, medication  administration, pain management, and patient education the patient requires 24 hour a day rehabilitation nursing.  The patient is currently min assist with mobility and basic ADLs.  Discharge setting and therapy post discharge at home with home health is anticipated.  Patient has agreed to participate in the Acute Inpatient Rehabilitation Program and will admit {Time; today/tomorrow:10263}.  Preadmission Screen Completed By:  Michel Santee, PT, DPT 12/30/2020 3:53 PM ______________________________________________________________________   Discussed status with Dr. [name] on *** at *** and received approval for admission ***.  Admission Coordinator:  Michel Santee, PT, time Marland KitchenSudie Grumbling ***   Assessment/Plan: Diagnosis: Does the need  for close, 24 hr/day Medical supervision in concert with the patient's rehab needs make it unreasonable for this patient to be served in a less intensive setting? {yes_no_potentially:3041433} Co-Morbidities requiring supervision/potential complications: *** Due to {due LP:5300511}, does the patient require 24 hr/day rehab nursing? {yes_no_potentially:3041433} Does the patient require coordinated care of a physician, rehab nurse, PT, OT, and SLP to address physical and functional deficits in the context of the above medical diagnosis(es)? {yes_no_potentially:3041433} Addressing deficits in the following areas: {deficits:3041436} Can the patient actively participate in an intensive therapy program of at least 3 hrs of therapy 5 days a week? {yes_no_potentially:3041433} The potential for patient to make measurable gains while on inpatient rehab is {potential:3041437} Anticipated functional outcomes upon discharge from inpatient rehab: {functional outcomes:304600100} PT, {functional outcomes:304600100} OT, {functional outcomes:304600100} SLP Estimated rehab length of stay to reach the above functional goals is: *** Anticipated discharge destination: {anticipated dc  setting:21604} 10. Overall Rehab/Functional Prognosis: {potential:3041437}   MD Signature: ***

## 2020-12-30 NOTE — Progress Notes (Signed)
Daily Progress Note   Patient Name: Erin [name]       Date: 12/30/2020 DOB: Mar 31, 1965  Age: 55 y.o. MRN#: [id number] Attending Physician: Elodia Florence., * Primary Care Physician: Pcp, No Admit Date: 12/16/2020 Length of Stay: 14 days  Reason for Consultation/Follow-up: Pain management  HPI/Patient Profile:  55 y.o. female  with past medical history of OSA on CPAP, HTN, DMII, morbid obesity who presented with worsening abdominal pain and thigh pain.  Recently undergoing work-up for cervical/urethral masses on CT in September 2022 with interval enlargement of left adnexal area and associated retroperitoneal and pelvic adenopathy.  MRI pelvis with poorly enhancing mass centered at the urethra, bilateral ovarian masses, mass involving the left uterine cervix, and significant adenopathy as well as pelvic osseous lesions concerning for metastatic disease.  She was admitted on 12/16/2020 with acute pain and for biopsy with urology and GYN oncology.   CEA noted to be elevated at 1864 and CEA-125 at 29.  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  Ironically, she had a colonoscopy in 2019 with tubular adenoma polyps removed that were large, but not apparently advanced.  Scans have not showed any evidence of GI malignancy.  Oncology consult ongoing for evaluation and neck steps.  Possible colonoscopy depending on oncology recommendations.   PMT initially consulted for goals of care and plan remains for full scope care with transition to CIR.  Asked to re-evaluate for pain.  Subjective:   Subjective: Chart Reviewed. Discussed with Dr. Florene Glen.    I saw and examined Erin [name] this evening.  She was lying in bed in no distress.  She was sleepy at time of my encounter.  We discussed pain management and she reports that she may be getting a little bit better relief since Dr. Florene Glen  increased her short acting medication earlier today.  We discussed seeing how she does with this change and reassessing tomorrow (either I can see her if she is still on the floor, or any further tweaks can be made by physiatry if she transitions to CIR).  Review of Systems  Respiratory:  Negative for shortness of breath.   Cardiovascular:  Negative for chest pain.  Gastrointestinal:  Positive for abdominal pain.  Musculoskeletal:        Noted leg pain   Objective:   Vital Signs:  BP (!) 162/89 Comment: has scheduled norvasc this am; will see if day RN can give this a little early.  Pulse (!) 106   Temp 100.1 F (37.8 C) (Oral)   Resp 20   Ht 5\' 4"  (1.626 m)   Wt 127 kg   SpO2 93%   BMI 48.06 kg/m   Physical Exam: Physical Exam Vitals and nursing note reviewed.  Constitutional:      General: She is not in acute distress.    Appearance: She is obese. She is ill-appearing. She is not toxic-appearing.  HENT:     Head: Normocephalic and atraumatic.  Cardiovascular:     Rate and Rhythm: Normal rate.     Heart sounds: No murmur heard. Pulmonary:     Effort: Pulmonary effort is normal. No respiratory distress.     Breath sounds: Normal breath sounds. No wheezing or rhonchi.  Abdominal:     General: Abdomen is flat.     Palpations: Abdomen is soft.     Tenderness: There is abdominal tenderness (Tenderness with light to  moderate palpation).  Skin:    General: Skin is warm and dry.  Neurological:     General: No focal deficit present.     Mental Status: She is alert.  Psychiatric:        Mood and Affect: Mood normal.        Behavior: Behavior normal.    Palliative Assessment/Data: 60%   Assessment & Plan:   Impression: Present on Admission: . Abdominal pain . OSA (obstructive sleep apnea) . Essential hypertension  55 year old female with pelvic masses undergoing workup as an outpatient now admitted for acute on chronic abdominal pain and thigh pain. Found enlarging  pelvic masses with biopsy consistent with GI primary. Pending decision on whether to repeat colonoscopy. Oncology evaluation pending. Awaiting oncology opinion, treatment options.  SUMMARY OF RECOMMENDATIONS   Continue full code Full scope of care/treatment.  Plan to transition to CIR. Pain, cancer related: Currently on short and long-acting oxycodone.  Reviewed MAR and she has had a total of 135 milligrams of oxycodone in the last 24 hours.  Based upon this with the recommended rescue dose of approximately 10% of 24-hour needs, I agree with increase in short acting medication to 15 mg.  If she does not go to CIR, will plan to follow-up tomorrow to assess her response to this change and make further adjustments as needed.  Code Status: Full code  Prognosis: Unable to determine  Discharge Planning: CIR  Discussed with: Medical team, nursing team, patient  Thank you for allowing [place] to participate in the care of Erin [name] PMT will continue to support holistically.  Time Total: 25 minutes  Greater than 50%  of this time was spent counseling and coordinating care related to the above assessment and plan.  Micheline Rough, MD [facility] Team [phone]

## 2020-12-30 NOTE — Progress Notes (Signed)
PROGRESS NOTE    Erin [name]  CBS:[id number] DOB: Jun 30, 1965 DOA: 12/16/2020 PCP: Pcp, No   Chief Complaint  Patient presents with   Leg Pain   Abdominal Pain    Brief Narrative:  Ms. Gainey is Erin [name] 55 y.o. female with PMH OSA on CPAP, HTN, DMII, morbid obesity who presented with worsening abdominal pain and left thigh pain.    She has been undergoing workup for recently discovered cervical/urethral masses on CT in Sept 2022.  Further imaging showed ongoing interval enlargement of the left adnexal area.  There was also associated retroperitoneal and pelvic adenopathy.  MRI pelvis also performed on 12/05/2020 showed poorly enhancing mass centered at the urethra, solid enhancing bilateral ovarian masses, poorly enhancing mass involving left uterine cervix, and significant adenopathy involving bilateral common iliac, bilateral external iliac, bilateral inguinal lymph nodes.  Also noted to have pelvic osseous lesions concerning for metastatic disease as well.   She was admitted for biopsy with urology and GYN oncology.  She was able to undergo cystoscopy with urethral and bladder biopsy on 12/17/2020.  She also underwent cervical biopsies following this procedure.  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[place])   Gastric cancer ([place])   Adenocarcinoma ([place])   Fever   Cancer related pain   Panic attack   Ovarian mass   Gastritis and gastroduodenitis   Abdominal pain   Elevated CEA   Dysuria   Malnutrition of moderate degree   Hypokalemia   OSA (obstructive sleep apnea)   Diabetes mellitus ([place])   Essential hypertension  * Mass of urethra - s/p MRI pelvis on 12/05/20   Notable for poorly marginated enhancing 3.7 x 2.8 x 3.0 cm mass centered at urethra concerning for malignancy -- solidly avidly  enhancing bilateral ovarian masses, largest 2.3 cm on R and 8.3 cm on L concerning for bilateral ovarian mets -- poorly marginated enhancing 2.4x2.3x3.0 cm mass in L uterine cervix, concerning for malignancy -- bilateral common iliac, bilateral external iliac, and bilateral inguinal LAD, concerning for metastatic disease -- diffuse patchy confluent nodular replacement of the pelvic osseus structures, concerning for osseus metastatic disease (see report)  -Biopsy taken during cystoscopy on 12/17/2020  - biopsy has returned on 11/11. 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  gastric nodule bx and notable for adenocarcinoma, poorly differentiated with signet ring morphology.  Focal invastion of muscularis mucosa.  Muscularis propia not present in bx.  Per path reports, this most likely represents primary source of metastatic carcinoma dx on bx of vagina on 11/8.   Oncology following, now s/p port placement 11/17  Appreciate gyn onc assistance   Suspected metastatic gastric cancer - oncology awaiting molecular testing to determine treatment options  Cycle 1 folfox started 11/18 per oncology, likely to discharge to CIR Monday after chemo complete (now awaiting fever w/u)  Fever ? Tumor fever Hold abx for now, consider starting if recurrent or spike > 101 F Follow blood and urine cultures.  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   gastric nodule bx and notable for  adenocarcinoma, poorly differentiated with signet ring morphology.  Focal invastion of muscularis mucosa.  Muscularis propia not present in bx.  Per path reports, this most likely represents primary source of metastatic carcinoma dx on bx of vagina on 11/8.  Outpatient PET study pending for outpatient  Appreciate oncology and gyn onc   Cancer related pain Will increase short acting oxycodone Continue long acting 40 mg BID Will ask palliative to reassess, appreciate assistance  LE [place] for LE pain negative.  Trial flexeril.  Increase gabapentin.  Panic attack Panic attack 11/19-20 overnight Out of body feeling, feeling of impending doom Prn benzo  Gastritis and gastroduodenitis EGD with gastritis PPI x 6 weeks then 40 mg daily bx as noted above  Colonoscopy with 5 polyps removed (1 in ascending and 4 in sigmoid) bx as noted above  Ovarian mass Noted on MRI pelvis Workup as noted above  Elevated CEA CEA 1864, CA 125 29 on admission  Abdominal pain 2/2 metastatic cancer Continue to adjust pain regimen as needed  Dysuria Follow UA and culture  Acute lower UTI-resolved as of 12/24/2020 S/p ceftriaxone Culture with strep viridans resolved   Constipation-resolved as of 12/24/2020 resolved  Malnutrition of moderate degree - Moderate malnutrition in context of acute illness/injury, morbid obesity - Evaluated by dietitian, appreciate assistance - per RD: "Moderate Malnutrition related to acute illness as evidenced by percent weight loss, moderate muscle depletion, energy intake < 75% for > 7 days."  Hypokalemia Replace and follow  OSA (obstructive sleep apnea) CPAP  Diabetes mellitus ([place]) SSI  Essential hypertension Amlodipine, coreg, follow  Likely d/c to CIR with plan for oncology to follow up  DVT prophylaxis: lovenox Code Status: full Family Communication: niece at bedside Disposition:   Status is: Inpatient  Remains inpatient appropriate because:  unsafe d/c plan       Consultants:  Oncology IR Urology GI Palliative care Gyn oncology  Procedures:  Diagnostic cystoscopy, bladder biopsy, urethral biopsy, 12/17/20 Cervical biopsies, 12/17/2020 EGD and colonoscopy 12/22/20 Port placement by IR 11/17  Antimicrobials: Anti-infectives (From admission, onward)    Start     Dose/Rate Route Frequency Ordered Stop   12/23/20 1000  cefTRIAXone (ROCEPHIN) 1 g in sodium chloride 0.9 % 100 mL IVPB        1 g 200 mL/hr over 30 Minutes Intravenous Every 24 hours 12/23/20 0746 12/24/20 1030   12/20/20 1000  cefTRIAXone (ROCEPHIN) 1 g in sodium chloride 0.9 % 100 mL IVPB        1 g 200 mL/hr over 30 Minutes Intravenous Every 24 hours 12/20/20 0752 12/22/20 1451   12/17/20 0930  ceFAZolin (ANCEF) 3 g in dextrose 5 % 50 mL IVPB  Status:  Discontinued        3 g 100 mL/hr over 30 Minutes Intravenous  Once 12/17/20 0925 12/17/20 0926   12/17/20 0915  ceFAZolin (ANCEF) IVPB 3g/100 mL premix        3 g 200 mL/hr over 30 Minutes Intravenous  Once 12/17/20 0914 12/17/20 0935       Subjective: Denies feeling fever  Objective: Vitals:   12/29/20 2131 12/30/20 0042 12/30/20 0608 12/30/20 1450  BP: (!) 159/85  (!) 162/89 (!) 152/86  Pulse: (!) 110 (!) 106 (!) 106 (!) 107  Resp: 19  20 18   Temp: 100.3 F (37.9  C) 99.8 F (37.7 C) 100.1 F (37.8 C) 99.4 F (37.4 C)  TempSrc: Oral Oral Oral Oral  SpO2: 94% 94% 93% 92%  Weight:      Height:        Intake/Output Summary (Last 24 hours) at 12/30/2020 1700 Last data filed at 12/30/2020 4665 Gross per 24 hour  Intake 120 ml  Output 900 ml  Net -780 ml   Filed Weights   12/16/20 1307 12/17/20 0841 12/22/20 0928  Weight: 127 kg 127 kg 127 kg    Examination:  General: No acute distress. Cardiovascular: RRR Lungs: unlabored Abdomen: Soft, nontender, nondistended Neurological: Alert and oriented 3. Moves all extremities 4. Cranial nerves II through XII grossly intact. Skin:  Warm and dry. No rashes or lesions. Extremities: No clubbing or cyanosis. No edema     Data Reviewed: I have personally reviewed following labs and imaging studies  CBC: Recent Labs  Lab 12/26/20 0405 12/27/20 0357 12/28/20 0535 12/29/20 0541 12/30/20 1044  WBC 13.0* 11.8* 14.3* 10.8* 9.0  NEUTROABS 9.7* 8.7* 11.4* 8.8* 7.7  HGB 8.0* 7.7* 8.1* 7.6* 7.9*  HCT 25.6* 25.0* 25.8* 24.3* 25.3*  MCV 95.9 95.4 94.5 95.3 95.1  PLT 250 245 260 228 993    Basic Metabolic Panel: Recent Labs  Lab 12/25/20 0415 12/26/20 0405 12/27/20 0357 12/28/20 0535 12/29/20 0541 12/30/20 1044  NA 132* 135 136 136 135 137  K 3.8 3.9 4.0 4.4 4.1 4.3  CL 101 105 106 104 103 104  CO2 22 23 22 25 24 26   GLUCOSE 130* 130* 123* 146* 148* 125*  BUN 19 18 16 17  24* 18  CREATININE 0.77 0.79 0.77 0.74 0.73 0.68  CALCIUM 8.9 9.4 9.2 9.2 9.2 9.3  MG 2.1 2.2 2.3 2.2 2.3  --   PHOS  --   --   --  3.6 3.3  --     GFR: Estimated Creatinine Clearance: 106.1 mL/min (by C-G formula based on SCr of 0.68 mg/dL).  Liver Function Tests: Recent Labs  Lab 12/28/20 0535 12/29/20 0541 12/30/20 1044  AST 45* 70* 82*  ALT 17 36 27  ALKPHOS 714* 712* 933*  BILITOT 0.4 0.5 0.7  PROT 6.3* 6.1* 6.6  ALBUMIN 2.2* 2.1* 2.1*    CBG: Recent Labs  Lab 12/29/20 1207 12/29/20 1648 12/29/20 2134 12/30/20 0805 12/30/20 1122  GLUCAP 149* 152* 190* 138* 128*     No results found for this or any previous visit (from the past 240 hour(s)).        Radiology Studies: DG CHEST PORT 1 VIEW  Result Date: 12/30/2020 CLINICAL DATA:  Fever, abdominal pain EXAM: PORTABLE CHEST 1 VIEW COMPARISON:  Previous studies including the CT chest done on 12/17/2020 FINDINGS: Transverse diameter of heart is increased. There are linear densities in both parahilar regions and medial left lower lung fields suggesting scarring or subsegmental atelectasis with interval worsening in the left lower lung fields. There is no  significant pleural effusion or pneumothorax. Surgical clips are seen in the right thyroid bed. IMPRESSION: Cardiomegaly. There are linear densities in both parahilar regions and left lower lung fields with interval worsening suggesting subsegmental atelectasis. There are no signs of alveolar pulmonary edema or focal pulmonary consolidation. Electronically Signed   By: Elmer Picker M.D.   On: 12/30/2020 12:04   VAS [place] LOWER EXTREMITY VENOUS (DVT)  Result Date: 12/29/2020  Lower Venous DVT Study Patient Name:  JARYAH ARACENA Hollis  Date of Exam:   12/29/2020 Medical  Rec #: [id number]            Accession #:    [id number] Date of Birth: 1965/06/21            Patient Gender: F Patient Age:   65 years Exam Location[facility]  [facility] Procedure:      VAS [place] LOWER EXTREMITY VENOUS (DVT) Referring Phys: Stacee Earp POWELL JR --------------------------------------------------------------------------------  Indications: Pain.  Risk Factors: Cancer. Limitations: Body habitus and poor ultrasound/tissue interface. Comparison Study: No prior studies. Performing Technologist: Oliver Hum RVT  Examination Guidelines: Joslyne [place]burn complete evaluation includes B-mode imaging, spectral Doppler, color Doppler, and power Doppler as needed of all accessible portions of each vessel. Bilateral testing is considered an integral part of Chastelyn [place] complete examination. Limited examinations for reoccurring indications may be performed as noted. The reflux portion of the exam is performed with the patient in reverse Trendelenburg.  +---------+---------------+---------+-----------+----------+--------------+ RIGHT    CompressibilityPhasicitySpontaneityPropertiesThrombus Aging +---------+---------------+---------+-----------+----------+--------------+ CFV      Full           Yes      Yes                                 +---------+---------------+---------+-----------+----------+--------------+ SFJ      Full                                                         +---------+---------------+---------+-----------+----------+--------------+ FV Prox  Full                                                        +---------+---------------+---------+-----------+----------+--------------+ FV Mid   Full                                                        +---------+---------------+---------+-----------+----------+--------------+ FV DistalFull                                                        +---------+---------------+---------+-----------+----------+--------------+ PFV      Full                                                        +---------+---------------+---------+-----------+----------+--------------+ POP      Full           Yes      Yes                                 +---------+---------------+---------+-----------+----------+--------------+ PTV      Full                                                        +---------+---------------+---------+-----------+----------+--------------+  PERO     Full                                                        +---------+---------------+---------+-----------+----------+--------------+   +---------+---------------+---------+-----------+----------+--------------+ LEFT     CompressibilityPhasicitySpontaneityPropertiesThrombus Aging +---------+---------------+---------+-----------+----------+--------------+ CFV      Full           Yes      Yes                                 +---------+---------------+---------+-----------+----------+--------------+ SFJ      Full                                                        +---------+---------------+---------+-----------+----------+--------------+ FV Prox  Full                                                        +---------+---------------+---------+-----------+----------+--------------+ FV Mid   Full                                                         +---------+---------------+---------+-----------+----------+--------------+ FV DistalFull                                                        +---------+---------------+---------+-----------+----------+--------------+ PFV      Full                                                        +---------+---------------+---------+-----------+----------+--------------+ POP      Full           Yes      Yes                                 +---------+---------------+---------+-----------+----------+--------------+ PTV      Full                                                        +---------+---------------+---------+-----------+----------+--------------+ PERO     Full                                                        +---------+---------------+---------+-----------+----------+--------------+  Summary: RIGHT: - There is no evidence of deep vein thrombosis in the lower extremity.  - No cystic structure found in the popliteal fossa.  LEFT: - There is no evidence of deep vein thrombosis in the lower extremity.  - No cystic structure found in the popliteal fossa.  *See table(s) above for measurements and observations. Electronically signed by Jamelle Haring on 12/29/2020 at 12:00:16 PM.    Final         Scheduled Meds:  amLODipine  5 mg Oral Daily   carvedilol  6.25 mg Oral BID WC   Chlorhexidine Gluconate Cloth  6 each Topical Daily   enoxaparin (LOVENOX) injection  60 mg Subcutaneous Q24H   feeding supplement  237 mL Oral BID BM   gabapentin  300 mg Oral QHS   insulin aspart  0-9 Units Subcutaneous TID WC   oxyCODONE  40 mg Oral Q12H   pantoprazole  40 mg Oral BID   polyethylene glycol  17 g Oral Daily   senna-docusate  1 tablet Oral BID   Continuous Infusions:   LOS: 14 days    Time spent: over 30 min    Fayrene Helper, MD [facility]   To contact the attending provider between 7A-7P or the covering provider during after hours 7P-7A, please log  into the web site [email or web address] and access using universal [place] password for that web site. If you do not have the password, please call the hospital operator.  12/30/2020, 5:00 PM

## 2020-12-30 NOTE — Progress Notes (Signed)
Daily Progress Note   Patient Name: Erin [name]       Date: 12/30/2020 DOB: [birth year]  Age: 55 y.o. MRN#: [id number] Attending Physician: Elodia Florence., * Primary Care Physician: Pcp, No Admit Date: 12/16/2020 Length of Stay: 14 days  Reason for Consultation/Follow-up: Pain management  Subjective: I saw and examined Erin [name] this afternoon.  She was lying in bed in no distress.    She reports that she thinks that increase in short acting oxycodone has been effective in helping to control her pain.  Currently reports that her pain is well controlled.  We discussed plan to continue to use this increased rescue dose of short acting medication over the next couple of days at which time consideration could be for increasing her long acting medication   Review of Systems  Respiratory:  Negative for shortness of breath.   Cardiovascular:  Negative for chest pain.  Gastrointestinal:  Positive for abdominal pain.  Musculoskeletal:        Noted leg pain   Objective:   Vital Signs:  BP (!) 152/86 (BP Location: Left Arm)   Pulse (!) 107   Temp 99.4 F (37.4 C) (Oral)   Resp 18   Ht 5\' 4"  (1.626 m)   Wt 127 kg   SpO2 92%   BMI 48.06 kg/m   Physical Exam: Physical Exam Vitals and nursing note reviewed.  Constitutional:      General: She is not in acute distress.    Appearance: She is obese. She is ill-appearing. She is not toxic-appearing.  HENT:     Head: Normocephalic and atraumatic.  Cardiovascular:     Rate and Rhythm: Normal rate.     Heart sounds: No murmur heard. Pulmonary:     Effort: Pulmonary effort is normal. No respiratory distress.     Breath sounds: Normal breath sounds. No wheezing or rhonchi.  Abdominal:     General: Abdomen is flat.     Palpations: Abdomen is soft.     Tenderness: There is abdominal tenderness (Tenderness with light to moderate palpation).  Skin:    General: Skin is warm and dry.  Neurological:     General: No focal  deficit present.     Mental Status: She is alert.  Psychiatric:        Mood and Affect: Mood normal.        Behavior: Behavior normal.    Palliative Assessment/Data: 60%   Assessment & Plan:   Impression: Present on Admission: . Abdominal pain . OSA (obstructive sleep apnea) . Essential hypertension  55 year old female with pelvic masses undergoing workup as an outpatient now admitted for acute on chronic abdominal pain and thigh pain. Found enlarging pelvic masses with biopsy consistent with GI primary. Pending decision on whether to repeat colonoscopy. Oncology evaluation pending. Awaiting oncology opinion, treatment options.  SUMMARY OF RECOMMENDATIONS   Continue full code Full scope of care/treatment.  Plan to transition to CIR. Pain, cancer related: Improved since increasing short acting medication.  Would recommend continue same with consideration to titrate her long acting oxycodone later this week if she consistently needs round the clock rescue dosing.  Code Status: Full code  Prognosis: Unable to determine  Discharge Planning: CIR  Discussed with: Medical team, nursing team, patient  Thank you for allowing [place] to participate in the care of Erin [name] PMT will continue to support holistically.  Time Total: 20 minutes Greater than 50%  of this time was  spent counseling and coordinating care related to the above assessment and plan.   Micheline Rough, MD [place] Team [phone]

## 2020-12-30 NOTE — Progress Notes (Signed)
Inpatient Rehab Admissions Coordinator:   I do not have a bed on CIR for this patient today.  Note pt with slight temp today (tmax 100.3).  Discussed with Dr. Letta Pate, who requested UA and CXR which appear to have been collected.  Will continue to follow for possible admission pending bed availability.   Shann Medal, PT, DPT Admissions Coordinator [phone] 12/30/20  12:27 PM

## 2020-12-30 NOTE — Progress Notes (Signed)
Physical Therapy Treatment Patient Details Name: Erin [name] MRN: [id number] DOB: 1965-07-26 Today's Date: 12/30/2020   History of Present Illness Pt is 55yo female who presented on 11/7 with increasing abdominal pain and L thigh pain. Has recent hx of cervical/urethral masses found on CT 9/22, as well as urethral mass, B ovarian masses, L uterine cervical mass, and pelvis osseous lesions concerning for metastatic disease. Admitted for further w/u and biopsy. PMH HLD, HTN, OSA, DOE, DM, shoulder arthroscopy with rotator cuff repair and subacromial decompression    PT Comments    Pt limited by pain and fatigue today. Pt reported gas and abdominal pain limiting her ability to participate. Pt did receive pain meds prior to our session. Pt agreeable to ambulate to the commode. Pt ambulated 12 ft x 2 with RW min assist. Pt had difficulty advancing L LE and had a very slow cadence. Pt min assist to transfer to and from commode. Pt had no LOB with gait, but was limited by fatigue and pain. Pt will continue to benefit from acute skilled PT to maximize mobility and independence for the next venue of care.  Recommendations for follow up therapy are one component of a multi-disciplinary discharge planning process, led by the attending physician.  Recommendations may be updated based on patient status, additional functional criteria and insurance authorization.  Follow Up Recommendations  Acute inpatient rehab (3hours/day)     Assistance Recommended at Discharge Frequent or constant Supervision/Assistance  Equipment Recommendations  Rolling walker (2 wheels)    Recommendations for Other Services       Precautions / Restrictions Precautions Precautions: Fall Precaution Comments: METS pelvis and spine Restrictions Weight Bearing Restrictions: No     Mobility  Bed Mobility               General bed mobility comments: sitting in recliner    Transfers Overall transfer level:  Needs assistance Equipment used: Rolling walker (2 wheels) Transfers: Sit to/from Stand;Bed to chair/wheelchair/BSC Sit to Stand: Min assist;From elevated surface                Ambulation/Gait Ambulation/Gait assistance: Min guard Gait Distance (Feet): 12 Feet (12 ft X 2 to commode) Assistive device: Rolling walker (2 wheels) Gait Pattern/deviations: Step-to pattern;Decreased stride length;Shuffle;Trunk flexed Gait velocity: slow     General Gait Details: increased pain and difficulty advancing L LE, increased effort and fatigue with gait, no LOB just limited by pain and fatigue   Stairs             Wheelchair Mobility    Modified Rankin (Stroke Patients Only)       Balance Overall balance assessment: No apparent balance deficits (not formally assessed) Sitting-balance support: Feet supported Sitting balance-Leahy Scale: Good     Standing balance support: Bilateral upper extremity supported;During functional activity Standing balance-Leahy Scale: Fair Standing balance comment: reliant on BUE support                            Cognition Arousal/Alertness: Awake/alert Behavior During Therapy: WFL for tasks assessed/performed Overall Cognitive Status: Within Functional Limits for tasks assessed                                          Exercises      General Comments General comments (skin integrity, edema, etc.): ambulated to Commode,  min assist to transfer to and from commode, min verbal cues to negotiate around obsticles with RW in the BR      Pertinent Vitals/Pain Pain Score: 9  Pain Location: pelvis area/ABD "deep" Pain Descriptors / Indicators: Discomfort Pain Intervention(s): Limited activity within patient's tolerance;Monitored during session;Premedicated before session    Home Living                          Prior Function            PT Goals (current goals can now be found in the care plan  section) Progress towards PT goals: Progressing toward goals    Frequency    Min 3X/week      PT Plan Current plan remains appropriate    Co-evaluation              AM-PAC PT "6 Clicks" Mobility   Outcome Measure  Help needed turning from your back to your side while in a flat bed without using bedrails?: A Lot Help needed moving from lying on your back to sitting on the side of a flat bed without using bedrails?: A Lot Help needed moving to and from a bed to a chair (including a wheelchair)?: A Little Help needed standing up from a chair using your arms (e.g., wheelchair or bedside chair)?: A Little Help needed to walk in hospital room?: A Little Help needed climbing 3-5 steps with a railing? : Total 6 Click Score: 14    End of Session Equipment Utilized During Treatment: Gait belt Activity Tolerance: Patient limited by pain;Patient limited by fatigue Patient left: in bed;with nursing/sitter in room Nurse Communication: Mobility status PT Visit Diagnosis: Unsteadiness on feet (R26.81);Difficulty in walking, not elsewhere classified (R26.2);Pain;Muscle weakness (generalized) (M62.81)     Time: 2820-6015 PT Time Calculation (min) (ACUTE ONLY): 46 min  Charges:  $Gait Training: 23-37 mins $Therapeutic Activity: 8-22 mins                       Lelon Mast 12/30/2020, 11:39 AM

## 2020-12-31 ENCOUNTER — Inpatient Hospital Stay (HOSPITAL_COMMUNITY): Payer: Self-pay

## 2020-12-31 DIAGNOSIS — D649 Anemia, unspecified: Secondary | ICD-10-CM

## 2020-12-31 LAB — BLOOD CULTURE ID PANEL (REFLEXED) - BCID2

## 2020-12-31 LAB — GLUCOSE, CAPILLARY
Glucose-Capillary: 130 mg/dL — ABNORMAL HIGH (ref 70–99)
Glucose-Capillary: 138 mg/dL — ABNORMAL HIGH (ref 70–99)
Glucose-Capillary: 142 mg/dL — ABNORMAL HIGH (ref 70–99)
Glucose-Capillary: 129 mg/dL — ABNORMAL HIGH (ref 70–99)

## 2020-12-31 LAB — COMPREHENSIVE METABOLIC PANEL
ALT: 21 U/L (ref 0–44)
AST: 55 U/L — ABNORMAL HIGH (ref 15–41)
Albumin: 1.9 g/dL — ABNORMAL LOW (ref 3.5–5.0)
Alkaline Phosphatase: 738 U/L — ABNORMAL HIGH (ref 38–126)
Anion gap: 9 (ref 5–15)
BUN: 19 mg/dL (ref 6–20)
CO2: 24 mmol/L (ref 22–32)
Calcium: 8.9 mg/dL (ref 8.9–10.3)
Chloride: 104 mmol/L (ref 98–111)
Creatinine, Ser: 0.72 mg/dL (ref 0.44–1.00)
GFR, Estimated: >60 mL/min (ref >60)
Glucose, Bld: 136 mg/dL — ABNORMAL HIGH (ref 70–99)
Potassium: 4.2 mmol/L (ref 3.5–5.1)
Sodium: 137 mmol/L (ref 135–145)
Total Bilirubin: 0.6 mg/dL (ref 0.3–1.2)
Total Protein: 5.8 g/dL — ABNORMAL LOW (ref 6.5–8.1)

## 2020-12-31 LAB — PHOSPHORUS: Phosphorus: 3.2 mg/dL (ref 2.5–4.6)

## 2020-12-31 LAB — MAGNESIUM: Magnesium: 2.3 mg/dL (ref 1.7–2.4)

## 2020-12-31 LAB — CBC WITH DIFFERENTIAL/PLATELET
Abs Immature Granulocytes: 0.16 10*3/uL — ABNORMAL HIGH (ref 0.00–0.07)
Basophils Absolute: 0 10*3/uL (ref 0.0–0.1)
Basophils Relative: 0 %
Eosinophils Absolute: 0 10*3/uL (ref 0.0–0.5)
Eosinophils Relative: 0 %
HCT: 20.5 % — ABNORMAL LOW (ref 36.0–46.0)
Hemoglobin: 6.6 g/dL — CL (ref 12.0–15.0)
Immature Granulocytes: 2 %
Lymphocytes Relative: 14 %
Lymphs Abs: 1.1 10*3/uL (ref 0.7–4.0)
MCH: 30.6 pg (ref 26.0–34.0)
MCHC: 32.2 g/dL (ref 30.0–36.0)
MCV: 94.9 fL (ref 80.0–100.0)
Monocytes Absolute: 0.1 10*3/uL (ref 0.1–1.0)
Monocytes Relative: 1 %
Neutro Abs: 6.4 10*3/uL (ref 1.7–7.7)
Neutrophils Relative %: 83 %
Platelets: 155 10*3/uL (ref 150–400)
RBC: 2.16 MIL/uL — ABNORMAL LOW (ref 3.87–5.11)
RDW: 15 % (ref 11.5–15.5)
WBC: 7.8 10*3/uL (ref 4.0–10.5)
nRBC: 0.3 % — ABNORMAL HIGH (ref 0.0–0.2)

## 2020-12-31 LAB — PREPARE RBC (CROSSMATCH)

## 2020-12-31 MED ORDER — SODIUM CHLORIDE 0.9% IV SOLUTION: Freq: Once | INTRAVENOUS | Status: AC

## 2020-12-31 MED ORDER — ENSURE ENLIVE PO LIQD
237.0000 mL | Freq: Three times a day (TID) | ORAL | Status: DC
  Administered 2021-01-01 – 2021-01-14 (×16): 237 mL via ORAL

## 2020-12-31 MED ORDER — ADULT MULTIVITAMIN W/MINERALS CH
1.0000 | ORAL_TABLET | Freq: Every day | ORAL | Status: DC
  Administered 2020-12-31 – 2021-01-06 (×7): 1 via ORAL
  Filled 2020-12-31 (×7): qty 1

## 2020-12-31 NOTE — Assessment & Plan Note (Signed)
Worsened anemia to 6.6 today No si/sx bleeding Transfuse 1 unit pRBC and follow

## 2020-12-31 NOTE — Progress Notes (Addendum)
HEMATOLOGY-ONCOLOGY PROGRESS NOTE  SUBJECTIVE: Has had intermittent low-grade fevers.  Denies cough.  No nausea or vomiting reported this morning.  No bleeding reported.  Pain overall controlled at this time.  PHYSICAL EXAMINATION:  Vitals:   12/30/20 2110 12/31/20 0533  BP: (!) 141/75 133/76  Pulse: (!) 109 (!) 101  Resp: 18 14  Temp: (!) 100.4 F (38 C) 98.5 F (36.9 C)  SpO2: 93% 99%   Filed Weights   12/16/20 1307 12/17/20 0841 12/22/20 0928  Weight: 127 kg 127 kg 127 kg    Intake/Output from previous day: 11/21 0701 - 11/22 0700 In: 240 [P.O.:240] Out: 1050 [Urine:1050]  LUNGS: clear anteriorly, no respiratory distress HEART: regular rate & rhythm and no murmurs and no lower extremity edema ABDOMEN: Soft, tender with firmness in the left lower abdomen NEURO: alert & oriented x 3 with fluent speech, no focal motor/sensory deficits Vascular: No leg edema  LABORATORY DATA:  I have reviewed the data as listed CMP Latest Ref Rng & Units 12/31/2020 12/30/2020 12/29/2020  Glucose 70 - 99 mg/dL 136(H) 125(H) 148(H)  BUN 6 - 20 mg/dL 19 18 24(H)  Creatinine 0.44 - 1.00 mg/dL 0.72 0.68 0.73  Sodium 135 - 145 mmol/L 137 137 135  Potassium 3.5 - 5.1 mmol/L 4.2 4.3 4.1  Chloride 98 - 111 mmol/L 104 104 103  CO2 22 - 32 mmol/L _0 Calcium 8.9 - 10.3 mg/dL 8.9 9.3 9.2  Total Protein 6.5 - 8.1 g/dL 5.8(L) 6.6 6.1(L)  Total Bilirubin 0.3 - 1.2 mg/dL 0.6 0.7 0.5  Alkaline Phos 38 - 126 U/L 738(H) 933(H) 712(H)  AST 15 - 41 U/L 55(H) 82(H) 70(H)  ALT 0 - 44 U/L 21 27 36    Lab Results  Component Value Date   WBC 7.8 12/31/2020   HGB 6.6 (LL) 12/31/2020   HCT 20.5 (L) 12/31/2020   MCV 94.9 12/31/2020   PLT 155 12/31/2020   NEUTROABS 6.4 12/31/2020    DG Chest 2 View  Result Date: 12/16/2020 CLINICAL DATA:  Shortness of breath in a 55 year old female. EXAM: CHEST - 2 VIEW COMPARISON:  July 10, 2009. FINDINGS: Linear opacities in the LEFT chest of progressed slightly  since the previous study. No lobar consolidation. Cardiomediastinal contours and hilar structures are stable. No sign of effusion. Mildly increased density in the subcarinal region on lateral projection. No acute skeletal process on limited assessment. IMPRESSION: Signs of scarring or atelectasis in the LEFT chest, slightly progressed since the previous study. Question developing subcarinal adenopathy, based on lateral projection. Developing lower lobe airspace disease is also a differential consideration. In a patient with pelvic masses on recent pelvic MRI would consider follow-up chest CT for further evaluation. Electronically Signed   By: Zetta Bills M.D.   On: 12/16/2020 14:13   CT CHEST W CONTRAST  Result Date: 12/17/2020 CLINICAL DATA:  Cancer of unknown primary.  Staging. EXAM: CT CHEST WITH CONTRAST TECHNIQUE: Multidetector CT imaging of the chest was performed during intravenous contrast administration. CONTRAST:  7m OMNIPAQUE IOHEXOL 350 MG/ML SOLN COMPARISON:  Chest CT dated 06/01/2007. FINDINGS: Cardiovascular: There is no cardiomegaly or pericardial effusion. Three-vessel coronary vascular calcification. Mild atherosclerotic calcification of the thoracic aorta. No aneurysmal dilatation. The origins of the great vessels of the aortic arch appear patent as visualized. There is dilatation of the main pulmonary trunk suggestive of pulmonary hypertension. Evaluation of the pulmonary arteries is limited due to respiratory motion artifact and suboptimal opacification and timing of  the contrast. No large or central pulmonary artery embolus identified. Mediastinum/Nodes: No hilar or mediastinal adenopathy. The esophagus is grossly unremarkable. Prior right hemithyroidectomy. No mediastinal fluid collection. Lungs/Pleura: Bibasilar subpleural atelectasis/scarring. There is no pleural effusion pneumothorax. The central airways are patent. Upper Abdomen: Indeterminate 3 cm left renal nodule, present on  the prior CT, likely a benign etiology such as adenoma. Musculoskeletal: Degenerative changes of the spine. No acute osseous pathology. IMPRESSION: 1. No acute intrathoracic pathology. No CT evidence of central pulmonary artery embolus. 2. Dilatation of the main pulmonary trunk suggestive of pulmonary hypertension. 3. Status post prior right hemithyroidectomy. 4. Bilateral linear atelectasis/scarring. 5. Aortic Atherosclerosis (ICD10-I70.0). Electronically Signed   By: Anner Crete M.D.   On: 12/17/2020 21:09   MR Lumbar Spine W Wo Contrast  Result Date: 12/16/2020 CLINICAL DATA:  Initial evaluation for low back pain, cancer suspected. EXAM: MRI LUMBAR SPINE WITHOUT AND WITH CONTRAST TECHNIQUE: Multiplanar and multiecho pulse sequences of the lumbar spine were obtained without and with intravenous contrast. CONTRAST:  88m GADAVIST GADOBUTROL 1 MMOL/ML IV SOLN COMPARISON:  Prior CT from earlier the same day. FINDINGS: Segmentation: Standard. Lowest well-formed disc space labeled the L5-S1 level. Alignment: 4 mm facet mediated anterolisthesis of L4 on L5. Alignment otherwise normal with preservation of the normal lumbar lordosis. Vertebrae: Diffusely abnormal appearance of the bone marrow is seen throughout the visualized lumbar spine and pelvis. Associated heterogeneous STIR hyperintensity with irregular heterogeneous postcontrast enhancement. Findings are highly suspicious for diffuse osseous metastatic disease. Involvement appears to be most pronounced within the T12 vertebral body as well as the visualized pelvis. Exact measurements of a discrete lesion is difficult given the overall infiltrative appearance of this finding. No associated pathologic fracture. No visible extra osseous extension of tumor at this time. Conus medullaris and cauda equina: Conus extends to the L1 level. Conus and cauda equina appear normal. No visible epidural or intracanalicular tumor. Paraspinal and other soft tissues: Mild  edema within the subcutaneous fat of the lower back, which could be related to overall volume status. Paraspinous soft tissues demonstrate no other acute finding. Retroperitoneal/iliac adenopathy noted. Left larger than right adnexal masses partially visualized. Findings better characterized on prior CT. Disc levels: L1-2:  Unremarkable. L2-3: Disc desiccation with mild disc bulge. Superimposed small right foraminal to extraforaminal disc protrusion (series 11, image 17). Mild facet hypertrophy. Underlying short pedicles with a degree of mild spinal stenosis. Mild bilateral L2 foraminal narrowing. L3-4: Disc desiccation with mild disc bulge. Superimposed shallow left extraforaminal disc protrusion with annular fissure (series 11, image 24). Mild facet hypertrophy. Underlying short pedicles. Mild spinal stenosis. Foramina remain patent. L4-5: 4 mm anterolisthesis. Disc desiccation with broad-based posterior pseudo disc bulge. Biforaminal annular fissures noted, left larger than right. Moderate bilateral facet arthrosis. Resultant moderate spinal stenosis. Mild to moderate bilateral L4 foraminal narrowing. L5-S1: Normal interspace. Mild right greater than left facet hypertrophy. No stenosis. IMPRESSION: 1. 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 No associated pathologic fracture or extra osseous extension of tumor at this time. 2. Retroperitoneal/iliac adenopathy with left larger than right adnexal masses, better characterized on prior CT. 3. Underlying multilevel degenerative spondylosis as above, most pronounced at L4-5 where there is resultant moderate spinal stenosis. Electronically Signed   By: BJeannine BogaM.D.   On: 12/16/2020 23:44   MR PELVIS W WO CONTRAST  Result Date: 12/05/2020 CLINICAL DATA:  55year old female with indeterminate  hypoechoic left adnexal mass identified in the  setting of left lower quadrant pain. EXAM: MRI PELVIS WITHOUT AND WITH CONTRAST TECHNIQUE: Multiplanar multisequence MR imaging of the pelvis was performed both before and after administration of intravenous contrast. CONTRAST:  20m GADAVIST GADOBUTROL 1 MMOL/ML IV SOLN COMPARISON:  12/01/2020 pelvic sonogram and unenhanced CT abdomen/pelvis. FINDINGS: Urinary Tract: Mild diffuse bladder wall thickening. There is a poorly marginated enhancing solid 3.7 x 2.8 x 3.0 cm mass centered at the urethra (series 32/image 42 and series 30/image 67), extending from the vesicourethral junction throughout the length of the urethra. There appears to be an underlying right periurethral diverticulum (series 5/image 27). At the inferior tip of the urethra, there is a simple 0.9 x 0.9 x 0.9 cm cystic lesion compatible with a Skene's duct cyst. Bowel: Visualized small and large bowel are normal caliber with no bowel wall thickening. Vascular/Lymphatic: Mild bilateral inguinal lymphadenopathy measuring up to 1.4 cm short axis diameter on the right (series 19/image 71) and 1.4 cm on the left (series 19/image 70). Moderate bilateral external iliac lymphadenopathy measuring up to 1.7 cm bilaterally (series 19/image 53 on the right and 56 on the left). Bilateral common iliac lymphadenopathy, largest 1.6 cm on the right (series 19/image 27) and 1.7 cm on the left (series 19/image 14). Reproductive: Uterus: The anteverted uterus measures 8.2 x 3.7 x 5.5 cm. No uterine fibroids. Inner myometrium (junctional zone) measures 12 mm in thickness, which is compatible with mild diffuse uterine adenomyosis. Endometrium measures 4 mm in bilayer thickness, which is within normal limits. No endometrial cavity fluid or focal endometrial mass. Poorly marginated enhancing solid mass of the left uterine cervix measuring 2.4 x 2.3 x 3.0 cm (series 5/image 21 and series 8/image 22), which appears to disrupt the normal cervical fibrous stroma without frank  parametrial invasion. Ovaries and Adnexa: The right ovary measures 4.0 x 2.4 x 4.3 cm and contains multiple solid avidly enhancing masses, largest 2.3 x 2.0 cm (series 19/image 33). The left ovary measures 8.3 x 7.7 x 8.5 cm and is replaced by a predominantly solid avidly enhancing mass. Other: No abnormal free fluid in the pelvis. No focal pelvic fluid collection. Musculoskeletal: Diffuse patchy confluent nodular replacement of the pelvic osseous structures, best seen on axial series 7. IMPRESSION: 1. Poorly marginated enhancing 3.7 x 2.8 x 3.0 cm mass centered at the urethra, extending from the vesicourethral junction throughout the length of the female urethra, with probable underlying right periurethral diverticulum. This mass is concerning for malignancy. 2. Solid avidly enhancing bilateral ovarian masses, largest 2.3 cm on the right and 8.3 cm on the left, worrisome for bilateral ovarian metastases. 3. Poorly marginated enhancing 2.4 x 2.3 x 3.0 cm mass in the left uterine cervix, which appears to disrupt the normal cervical fibrous stroma without frank parametrial invasion, suspicious for malignancy. 4. Mild-to-moderate bilateral common iliac, bilateral external iliac and bilateral inguinal lymphadenopathy, suspicious for metastatic disease. 5. Diffuse patchy confluent nodular replacement of the pelvic osseous structures, suspicious for osseous metastatic disease. 6. Mild diffuse uterine adenomyosis. Electronically Signed   By: JIlona SorrelM.D.   On: 12/05/2020 20:31   [place]  Result Date: 12/16/2020 CLINICAL DATA:  Pelvic pain EXAM: TRANSABDOMINAL ULTRASOUND OF PELVIS DOPPLER ULTRASOUND OF OVARIES TECHNIQUE: Transabdominal ultrasound examination of the pelvis was performed including evaluation of the uterus, ovaries, adnexal regions, and pelvic cul-de-sac. Color and duplex Doppler ultrasound was utilized to evaluate blood flow to the ovaries. COMPARISON:  CT 12/16/2020,  MRI 12/05/2020,  pelvic ultrasound 12/01/2020 FINDINGS: Uterus Measurements: 6.8 x 3.4 x 5 cm = volume: 60.5 mL. No fibroids or other mass visualized. Endometrium Poorly visible, unable to measure. Right ovary Measurements: 4.8 x 2.9 x 5.5 cm = volume: 38.6 mL. Solid right adnexal mass replaces most of right ovary. Left ovary Measurements: 10.5 x 8.7 x 9.2 cm = volume: 440 mL. Enlarged left ovary with heterogenous solid mass that replaces the left ovary. Previously the left ovary measured 8.3 x 7.7 x 8.5 cm on MRI. Pulsed Doppler evaluation demonstrates normal arterial and venous waveforms in both or adnexa, though note that both ovaries are essentially replaced by mass lesions. Other: No significant ascites. Foley catheter within the bladder which appears somewhat thick walled. IMPRESSION: 1. Very limited exam secondary to habitus and only trans abdominal technique 2. Bilateral solid adnexal masses essentially replacing the ovaries which thereby limits assessment of the ovaries. Left adnexal solid mass lesion has significantly increased in size; documented flow in the adnexa relates to flow within the solid adnexal masses. Gynecology follow-up recommended. Electronically Signed   By: Donavan Foil M.D.   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 EXAM: CT ABDOMEN AND PELVIS WITH CONTRAST TECHNIQUE: Multidetector CT imaging of the abdomen and pelvis was performed using the standard protocol following bolus administration of intravenous contrast. CONTRAST:  23m OMNIPAQUE IOHEXOL 350 MG/ML SOLN COMPARISON:  Comparison is made with multiple prior studies which were performed recently, most recent comparison is an MRI of the pelvis of December 05, 2020. FINDINGS: Lower chest: Basilar atelectasis. Subcarinal region not well evaluated. No consolidation or effusion at the lung  bases. Hepatobiliary: No focal, suspicious hepatic lesion. No pericholecystic stranding. Portal vein is patent. No biliary duct dilation. Pancreas: Pancreas with normal contours. No signs of adjacent inflammation. Spleen: Spleen normal size and contour. Adrenals/Urinary Tract: Well-circumscribed LEFT adrenal lesion, density value of 19 Hounsfield units on previous noncontrast imaging measuring 3.2 x 2.6 cm., relative washout is indeterminate at 30%. Normal RIGHT adrenal. Normal, symmetric enhancement of bilateral kidneys without focal renal lesion. Stomach/Bowel: Small hiatal hernia. No stranding adjacent to the stomach. No sign of small bowel obstruction or acute small bowel process. The appendix is normal. Stool in various parts of the colon without signs of obstruction or adjacent stranding. Vascular/Lymphatic: Retroperitoneal adenopathy (image 50/2) 14 mm lymph node previously 11 mm along the LEFT common iliac chain. LEFT pelvic sidewall lymph node (image 75/2) 14 mm short axis, previously 13 mm. Similar size of RIGHT pelvic sidewall/external iliac lymph nodes also with little change compared to the most recent comparison imaging. Reproductive: Foley catheter in situ. Uterus and cervix not well at assess nor are the masses within the area of the cervix and urethra seen on previous MRI. Increasing size of LEFT ovary with heterogeneous appearance now measuring 10 x 8.8 cm. When assessed on the study of October 20, 2020 the ovary for reference measured 3.7 x 2.5 cm. RIGHT ovary with area of enhancement shows enlargement as well but not as pronounced as on previous imaging with 2.8 cm area of enhancement in the RIGHT ovary as the largest discrete area which is similar to the recent MRI evaluation. As compared to the study of October 20, 2020 there is little change in the appearance of the RIGHT ovary aside from this area though there is  an increasingly nodular appearance of the ovary in this location. Mass  posterior to the LEFT ovary just above the uterus measuring 4.4 x 2.8 cm has enlarged from 2.5 cm. Other: Small volume ascites. Musculoskeletal: Heterogeneous pattern associated with the spine in terms of density raises the question of metastatic disease without discrete measurable focal lesion in the spine. For example a geographic area of sclerosis on image 101/5) in the anterior L3 vertebral body does raise the question of underlying lesion with numerous additional foci, essentially nearly all levels of the spine with some degree of heterogeneity. Another example of this finding is noted on the RIGHT at the T10 level. No acute bone finding or frankly destructive bone process. IMPRESSION: Rapid increase in size of the LEFT ovary with some adjacent ascites. The rapid enlargement is seen in the setting of generalized worsening of masses associated with the LEFT and RIGHT ovary but perhaps more pronounced than other areas in the pelvis. The possibility of torsion associated with an ovarian mass could be considered based on this finding. Would correlate with any worsening abdominal pain and with sonogram as warranted for further assessment. Enlarging masses elsewhere though not to the extent that is seen with the LEFT ovary. Masslike area of the cervix and potentially within a urethral diverticulum not as well seen as on the recent MRI evaluation. Small volume ascites. Well-circumscribed LEFT adrenal lesion measuring 3.2 x 2.6 cm, density value of 19 Hounsfield units on previous noncontrast imaging. Relative washout is indeterminate at 30%. This may represent an adrenal adenoma. Consider dedicated adrenal protocol CT or attention on PET evaluation if performed. Heterogeneous pattern of subtle sclerosis and lucency in the spine on today's study raising the question of bony metastatic disease in the spine. Findings of increased ovarian size on the LEFT out of proportion other areas were called by telephone at the time  of interpretation on 12/16/2020 at 5:44 pm to provider Theodis Blaze , who verbally acknowledged these results. Electronically Signed   By: Zetta Bills M.D.   On: 12/16/2020 17:46   [place] Art/Ven Flow Abd Pelv Doppler  Result Date: 12/16/2020 CLINICAL DATA:  Pelvic pain EXAM: TRANSABDOMINAL ULTRASOUND OF PELVIS DOPPLER ULTRASOUND OF OVARIES TECHNIQUE: Transabdominal ultrasound examination of the pelvis was performed including evaluation of the uterus, ovaries, adnexal regions, and pelvic cul-de-sac. Color and duplex Doppler ultrasound was utilized to evaluate blood flow to the ovaries. COMPARISON:  CT 12/16/2020, MRI 12/05/2020, pelvic ultrasound 12/01/2020 FINDINGS: Uterus Measurements: 6.8 x 3.4 x 5 cm = volume: 60.5 mL. No fibroids or other mass visualized. Endometrium Poorly visible, unable to measure. Right ovary Measurements: 4.8 x 2.9 x 5.5 cm = volume: 38.6 mL. Solid right adnexal mass replaces most of right ovary. Left ovary Measurements: 10.5 x 8.7 x 9.2 cm = volume: 440 mL. Enlarged left ovary with heterogenous solid mass that replaces the left ovary. Previously the left ovary measured 8.3 x 7.7 x 8.5 cm on MRI. Pulsed Doppler evaluation demonstrates normal arterial and venous waveforms in both or adnexa, though note that both ovaries are essentially replaced by mass lesions. Other: No significant ascites. Foley catheter within the bladder which appears somewhat thick walled. IMPRESSION: 1. Very limited exam secondary to habitus and only trans abdominal technique 2. Bilateral solid adnexal masses essentially replacing the ovaries which thereby limits assessment of the ovaries. Left adnexal solid mass lesion has significantly increased in size; documented flow in the adnexa relates to flow within the solid adnexal masses. Gynecology follow-up recommended.  Electronically Signed   By: Donavan Foil M.D.   On: 12/16/2020 19:23   DG CHEST PORT 1 VIEW  Result Date: 12/30/2020 CLINICAL DATA:  Fever,  abdominal pain EXAM: PORTABLE CHEST 1 VIEW COMPARISON:  Previous studies including the CT chest done on 12/17/2020 FINDINGS: Transverse diameter of heart is increased. There are linear densities in both parahilar regions and medial left lower lung fields suggesting scarring or subsegmental atelectasis with interval worsening in the left lower lung fields. There is no significant pleural effusion or pneumothorax. Surgical clips are seen in the right thyroid bed. IMPRESSION: Cardiomegaly. There are linear densities in both parahilar regions and left lower lung fields with interval worsening suggesting subsegmental atelectasis. There are no signs of alveolar pulmonary edema or focal pulmonary consolidation. Electronically Signed   By: Elmer Picker M.D.   On: 12/30/2020 12:04   US PELVIC COMPLETE W TRANSVAGINAL AND TORSION R/O  Result Date: 12/01/2020 CLINICAL DATA:  LLQ Pain EXAM: TRANSABDOMINAL AND TRANSVAGINAL ULTRASOUND OF PELVIS TECHNIQUE: Both transabdominal and transvaginal ultrasound examinations of the pelvis were performed. Transabdominal technique was performed for global imaging of the pelvis including uterus, ovaries, adnexal regions, and pelvic cul-de-sac. It was necessary to proceed with endovaginal exam following the transabdominal exam to visualize the endometrium and uterus. COMPARISON:  December 01, 2020, September eleventh 2022 FINDINGS: Uterus Uterus is only visualized transvaginally and is suboptimally assessed due to patient incomplete voiding and body habitus. Measurements: 8.8 x 3.6 x 5.0 cm = volume: 84 mL. Bulkiness of the cervix. Endometrium Poorly visualized due to distension of the bladder on transvaginal imaging. Right ovary Not visualized transabdominally or transvaginally. Left adnexal mass: Adnexal mass is labeled as uterus on transabdominal imaging. It measures 9.5 x 6.9 x 7.5 cm for a volume of 260 ML. It is predominately hypoechoic. There are internal anechoic spaces  consistent with cystic density within the mass. This corresponds to the mass seen on CT. Doppler imaging was not performed of this mass, limiting evaluation for blood flow. Other findings No abnormal free fluid. IMPRESSION: 1. There is a hypoechoic LEFT adnexal mass which measures approximately 9.5 cm with central internal cystic density. Given newly conspicuous appearance since September 2022, this may reflect a hemorrhagic cyst or sequela of torsed ovary. Recommend GYN consult, and consideration of pelvis MRI w/o and w/ contrast if clinically warranted. Note: This recommendation does not apply to premenarchal patients or to those with increased risk (genetic, family history, elevated tumor markers or other high-risk factors) of ovarian cancer. Reference: Radiology 2019 Nov; 293(2):359-371. 2. Bulkiness of the cervix, suboptimally assessed with current technique. Recommend correlation with physical exam. Electronically Signed   By: Valentino Saxon M.D.   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 EXAM: IMPLANTED PORT A CATH PLACEMENT WITH ULTRASOUND AND FLUOROSCOPIC GUIDANCE COMPARISON:  None. MEDICATIONS: None. ANESTHESIA/SEDATION: Moderate (conscious) sedation was employed during this procedure. A total of Versed 3 mg and Fentanyl 100 mcg was administered intravenously. Moderate Sedation Time: 19 minutes. The patient's level of consciousness and vital signs were monitored continuously by radiology nursing throughout the procedure under my direct supervision. CONTRAST:  None FLUOROSCOPY TIME:  0 minutes, 12 seconds (5 mGy) COMPLICATIONS: None immediate. PROCEDURE: The procedure, risks, benefits, and alternatives were explained to the patient. Questions regarding the procedure were encouraged and answered. The patient  understands and consents  to the procedure. The right neck and chest were prepped with chlorhexidine in a sterile fashion, and a sterile drape was applied covering the operative field. Maximum barrier sterile technique with sterile gowns and gloves were used for the procedure. A timeout was performed prior to the initiation of the procedure. Ultrasound was used to examine the jugular vein which was compressible and free of internal echoes. A skin marker was used to demarcate the planned venotomy and port pocket incision sites. Local anesthesia was provided to these sites and the subcutaneous tunnel track with 1% lidocaine with 1:100,000 epinephrine. A small incision was created at the jugular access site and blunt dissection was performed of the subcutaneous tissues. Under ultrasound guidance, the jugular vein was accessed with a 21 ga micropuncture needle and an 0.018" wire was inserted to the superior vena cava. Real-time ultrasound guidance was utilized for vascular access including the acquisition of a permanent ultrasound image documenting patency of the accessed vessel. A 5 Fr micopuncture set was then used, through which a 0.035" Rosen wire was passed under fluoroscopic guidance into the inferior vena cava. An 8 Fr dilator was then placed over the wire. A subcutaneous port pocket was then created along the upper chest wall utilizing a combination of sharp and blunt dissection. The pocket was irrigated with sterile saline, packed with gauze, and observed for hemorrhage. A single lumen "ISP" sized power injectable port was chosen for placement. The 8 Fr catheter was tunneled from the port pocket site to the venotomy incision. The port was placed in the pocket. The external catheter was trimmed to appropriate length. The dilator was exchanged for an 8 Fr peel-away sheath under fluoroscopic guidance. The catheter was then placed through the sheath and the sheath was removed. Final catheter positioning was  confirmed and documented with a fluoroscopic spot radiograph. The port was accessed with a Huber needle, aspirated, and flushed with heparinized saline. The deep dermal layer of the port pocket incision was closed with interrupted 3-0 Vicryl suture. The skin was opposed with a running subcuticular 4-0 Monocryl suture. Dermabond was then placed over the port pocket and neck incisions. The patient tolerated the procedure well without immediate post procedural complication. FINDINGS: After catheter placement, the tip lies within the superior cavoatrial junction. The catheter aspirates and flushes normally and is ready for immediate use. IMPRESSION: Successful placement of a power injectable Port-A-Cath via the right internal jugular vein. The catheter is ready for immediate use. Ruthann Cancer, MD [facility] Electronically Signed   By: Ruthann Cancer M.D.   On: 12/26/2020 11:08   VAS [place] LOWER EXTREMITY VENOUS (DVT)  Result Date: 12/29/2020  Lower Venous DVT Study Patient Name:  RYENN HOWETH Robeck  Date of Exam:   12/29/2020 Medical Rec #: [id number]            Accession #:    [id number] Date of Birth: February 16, 1965            Patient Gender: F Patient Age:   75 years Exam Location:  [facility] Procedure:      VAS [place] LOWER EXTREMITY VENOUS (DVT) Referring Phys: A POWELL JR --------------------------------------------------------------------------------  Indications: Pain.  Risk Factors: Cancer. Limitations: Body habitus and poor ultrasound/tissue interface. Comparison Study: No prior studies. Performing Technologist: Oliver Hum RVT  Examination Guidelines: A complete evaluation includes B-mode imaging, spectral Doppler, color Doppler, and power Doppler as needed of all accessible portions of each vessel. Bilateral testing is considered an  integral part of a complete examination. Limited examinations for reoccurring indications may be performed as  noted. The reflux portion of the exam is performed with the patient in reverse Trendelenburg.  +---------+---------------+---------+-----------+----------+--------------+ RIGHT    CompressibilityPhasicitySpontaneityPropertiesThrombus Aging +---------+---------------+---------+-----------+----------+--------------+ CFV      Full           Yes      Yes                                 +---------+---------------+---------+-----------+----------+--------------+ SFJ      Full                                                        +---------+---------------+---------+-----------+----------+--------------+ FV Prox  Full                                                        +---------+---------------+---------+-----------+----------+--------------+ FV Mid   Full                                                        +---------+---------------+---------+-----------+----------+--------------+ FV DistalFull                                                        +---------+---------------+---------+-----------+----------+--------------+ PFV      Full                                                        +---------+---------------+---------+-----------+----------+--------------+ POP      Full           Yes      Yes                                 +---------+---------------+---------+-----------+----------+--------------+ PTV      Full                                                        +---------+---------------+---------+-----------+----------+--------------+ PERO     Full                                                        +---------+---------------+---------+-----------+----------+--------------+   +---------+---------------+---------+-----------+----------+--------------+ LEFT     CompressibilityPhasicitySpontaneityPropertiesThrombus Aging +---------+---------------+---------+-----------+----------+--------------+ CFV  Full            Yes      Yes                                 +---------+---------------+---------+-----------+----------+--------------+ SFJ      Full                                                        +---------+---------------+---------+-----------+----------+--------------+ FV Prox  Full                                                        +---------+---------------+---------+-----------+----------+--------------+ FV Mid   Full                                                        +---------+---------------+---------+-----------+----------+--------------+ FV DistalFull                                                        +---------+---------------+---------+-----------+----------+--------------+ PFV      Full                                                        +---------+---------------+---------+-----------+----------+--------------+ POP      Full           Yes      Yes                                 +---------+---------------+---------+-----------+----------+--------------+ PTV      Full                                                        +---------+---------------+---------+-----------+----------+--------------+ PERO     Full                                                        +---------+---------------+---------+-----------+----------+--------------+     Summary: RIGHT: - There is no evidence of deep vein thrombosis in the lower extremity.  - No cystic structure found in the popliteal fossa.  LEFT: - There is no evidence of deep vein thrombosis in the lower extremity.  - No cystic structure found in the popliteal fossa.  *See table(s) above for measurements and  observations. Electronically signed by Jamelle Haring on 12/29/2020 at 12:00:16 PM.    Final     ASSESSMENT AND PLAN: 1.  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 -12/05/2020 MRI of the pelvis-poorly marginated enhancing 3.7 x 2.8 x 3.0 cm  mass centered at the urethra, solid avidly enhancing bilateral ovarian masses, poorly marginated enhancing 2.4 x 2.3 x 3.0 cm mass in the left uterine cervix, mild to moderate bilateral common iliac, bilateral external iliac, and bilateral inguinal lymphadenopathy, diffuse patchy confluent nodular replacement of the pelvic osseous structures. -12/13/2020 CEA 1864, CA125 29.2 -12/16/2020 CT abdomen/pelvis-rapidly increasing size of the left ovary with some adjacent ascites, enlarging masses elsewhere, masslike area of the cervix and potentially within a urethral diverticulum, well-circumscribed left adrenal lesion measuring 3.2 x 2.6 cm, heterogeneous pattern of subtle sclerosis and lucency in the spine. -12/16/2020 MRI of the lumbar spine-diffusely abnormal appearance of the bone marrow throughout the visualized lumbar spine and pelvis highly suspicious for diffuse osseous metastatic disease, retroperitoneal/iliac adenopathy with left larger than right adnexal masses. 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  Abdominal pain secondary #1 3.  Constipation 4.  Normocytic anemia 5.  Leukocytosis 6.  Protein calorie malnutrition 7.  Obstructive sleep apnea 8.  Diabetes mellitus 9.  Hypertension 10.  Possible pulmonary hypertension noted on CT chest  Ms. Skufca appears unchanged.  She received her first cycle of FOLFOX on 12/27/2020 and tolerated it well.  Pain is overall controlled at this time.  She is not having any nausea or vomiting this morning.  She has been having intermittent low-grade fevers.  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  Blood cultures were drawn yesterday and she is growing gram-positive cocci in the aerobic bottle only in 1 set of the blood cultures.?  contaminant.  It is possible that her fevers are related to atelectasis or tumor fever.  I have ordered an incentive spirometer.  Hemoglobin this morning is down to 6.6.  PRBC transfusion has been ordered per hospitalist.   From our standpoint, okay for her to go to CIR when bed is available and after PRBC transfusion.  We will continue to check on her and CIR.  We will arrange for her second cycle of chemotherapy after CIR discharge.  Will consider PET scan as an outpatient once she is done with inpatient rehabilitation.  The tumor has been submitted for molecular testing, HER2, and PD-L1 testing and we will follow-up on these results.  Recommendations: 1.  Continue current pain medications. 2.  Plan for outpatient PET scan once she is done with rehab. 3.  We will follow-up on molecular testing, HER2, and PD-L1 results. 4.  Proceed with PRBC transfusion as ordered by hospitalist. 5.  Incentive spirometer ordered. 6.  We will arrange for outpatient follow-up following CIR discharge for her second cycle of chemotherapy.  Future Appointments  Date Time Provider [place]  02/18/2021 10:45 AM [name], PA-C [facility]      LOS: 15 days   [name] 12/31/20  Ms. Knezevic was interviewed and examined.  She is now at day 5 following cycle 1 FOLFOX.  She tolerated the chemotherapy without acute toxicity.  She has  severe anemia secondary to chronic disease, phlebotomy, and bone marrow involvement with metastatic carcinoma.  She was afebrile I saw her this morning.  A single blood culture returned positive for gram-positive cocci.  This may be a contaminant.  No apparent source for infection has been identified.  The Port-A-Cath does not appear infected and it would  be unusual to have a Port-A-Cath infection this soon after placement.  She will likely require skilled nursing facility placement if she is not a candidate for inpatient rehab.  I was present for greater than 50% of today's visit.  I performed medical decision making.

## 2020-12-31 NOTE — TOC Progression Note (Signed)
Transition of Care [facility]) - Progression Note    Patient Details  Name: Erin [name] MRN: [id number] Date of Birth: 06-02-1965  Transition of Care [facility]) CM/SW Contact  Ross Ludwig, [place] Phone Number: 12/31/2020, 1:17 PM  Clinical Narrative:     Per CIR, patient not medically ready to discharge to CIR.  CSW to continue to follow patient's progress throughout discharge planning.   Expected Discharge Plan: IP Rehab Facility Barriers to Discharge: Continued Medical Work up, Inadequate or no insurance  Expected Discharge Plan and Services Expected Discharge Plan: [place] In-house Referral: Clinical Social Work   Post Acute Care Choice: IP Rehab Living arrangements for the past 2 months: Apartment                 DME Arranged: N/A DME Agency: NA                   Social Determinants of Health (SDOH) Interventions    Readmission Risk Interventions No flowsheet data found.

## 2020-12-31 NOTE — Progress Notes (Signed)
PHARMACY - PHYSICIAN COMMUNICATION CRITICAL VALUE ALERT - BLOOD CULTURE IDENTIFICATION (BCID)  Erin [name] is an 55 y.o. female who was recently diagnosed with gastric cancer and received her first cycle of folfox on 11/18. One of four blood culture bottles collected on 11/21 has GPC (BCID = staph species).  Name of physician (or Provider) Contacted: Dr. Florene Glen  Current antibiotics: none  Changes to prescribed antibiotics recommended:  - suspects contamination. Monitor off abx for now  Results for orders placed or performed during the hospital encounter of 12/16/20  Blood Culture ID Panel (Reflexed) (Collected: 12/30/2020 10:44 AM)  Result Value Ref Range   Enterococcus faecalis NOT DETECTED NOT DETECTED   Enterococcus Faecium NOT DETECTED NOT DETECTED   Listeria monocytogenes NOT DETECTED NOT DETECTED   Staphylococcus species DETECTED (A) NOT DETECTED   Staphylococcus aureus (BCID) NOT DETECTED NOT DETECTED   Staphylococcus epidermidis NOT DETECTED NOT DETECTED   Staphylococcus lugdunensis NOT DETECTED NOT DETECTED   Streptococcus species NOT DETECTED NOT DETECTED   Streptococcus agalactiae NOT DETECTED NOT DETECTED   Streptococcus pneumoniae NOT DETECTED NOT DETECTED   Streptococcus pyogenes NOT DETECTED NOT DETECTED   A.calcoaceticus-baumannii NOT DETECTED NOT DETECTED   Bacteroides fragilis NOT DETECTED NOT DETECTED   Enterobacterales NOT DETECTED NOT DETECTED   Enterobacter cloacae complex NOT DETECTED NOT DETECTED   Escherichia coli NOT DETECTED NOT DETECTED   Klebsiella aerogenes NOT DETECTED NOT DETECTED   Klebsiella oxytoca NOT DETECTED NOT DETECTED   Klebsiella pneumoniae NOT DETECTED NOT DETECTED   Proteus species NOT DETECTED NOT DETECTED   Salmonella species NOT DETECTED NOT DETECTED   Serratia marcescens NOT DETECTED NOT DETECTED   Haemophilus influenzae NOT DETECTED NOT DETECTED   Neisseria meningitidis NOT DETECTED NOT DETECTED   Pseudomonas aeruginosa  NOT DETECTED NOT DETECTED   Stenotrophomonas maltophilia NOT DETECTED NOT DETECTED   Candida albicans NOT DETECTED NOT DETECTED   Candida auris NOT DETECTED NOT DETECTED   Candida glabrata NOT DETECTED NOT DETECTED   Candida krusei NOT DETECTED NOT DETECTED   Candida parapsilosis NOT DETECTED NOT DETECTED   Candida tropicalis NOT DETECTED NOT DETECTED   Cryptococcus neoformans/gattii NOT DETECTED NOT DETECTED    [name] P 12/31/2020  9:10 AM

## 2020-12-31 NOTE — Progress Notes (Signed)
PROGRESS NOTE    Erin [name]  KDX:[id number] DOB: Jun 12, 1965 DOA: 12/16/2020 PCP: Pcp, No   Chief Complaint  Patient presents with   Leg Pain   Abdominal Pain    Brief Narrative:  Erin [name] is Erin [name] 55 y.o. female with PMH OSA on CPAP, HTN, DMII, morbid obesity who presented with worsening abdominal pain and left thigh pain.    She has been undergoing workup for recently discovered cervical/urethral masses on CT in Sept 2022.  Further imaging showed ongoing interval enlargement of the left adnexal area.  There was also associated retroperitoneal and pelvic adenopathy.  MRI pelvis also performed on 12/05/2020 showed poorly enhancing mass centered at the urethra, solid enhancing bilateral ovarian masses, poorly enhancing mass involving left uterine cervix, and significant adenopathy involving bilateral common iliac, bilateral external iliac, bilateral inguinal lymph nodes.  Also noted to have pelvic osseous lesions concerning for metastatic disease as well.   She was admitted for biopsy with urology and GYN oncology.  She was able to undergo cystoscopy with urethral and bladder biopsy on 12/17/2020.  She also underwent cervical biopsies following this procedure.  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  She's s/p cycle 1 folfox 11/18.  Had planned for discharge to CIR on 11/21, but developed fever, now undergoing workup.  She's requiring transfusion today.  Will need to be more stable prior to transfer to CIR.  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[place])   Gastric cancer ([place])   Adenocarcinoma ([place])   Fever   Cancer related pain   Anemia   Panic attack   Ovarian mass   Gastritis and gastroduodenitis   Abdominal pain   Elevated CEA   Dysuria   Malnutrition of moderate degree   Hypokalemia   OSA (obstructive sleep apnea)    Diabetes mellitus ([place])   Essential hypertension  * Mass of urethra - s/p MRI pelvis on 12/05/20   Notable for poorly marginated enhancing 3.7 x 2.8 x 3.0 cm mass centered at urethra concerning for malignancy -- solidly avidly enhancing bilateral ovarian masses, largest 2.3 cm on R and 8.3 cm on L concerning for bilateral ovarian mets -- poorly marginated enhancing 2.4x2.3x3.0 cm mass in L uterine cervix, concerning for malignancy -- bilateral common iliac, bilateral external iliac, and bilateral inguinal LAD, concerning for metastatic disease -- diffuse patchy confluent nodular replacement of the pelvic osseus structures, concerning for osseus metastatic disease (see report)  -Biopsy taken during cystoscopy on 12/17/2020  - biopsy has returned on 11/11. 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  gastric nodule bx and notable for adenocarcinoma, poorly differentiated with signet ring morphology.  Focal invastion of muscularis mucosa.  Muscularis propia not present in bx.  Per path reports, this most likely represents primary source of metastatic carcinoma dx on bx of vagina on 11/8.   Oncology following, now s/p port placement 11/17  Appreciate gyn onc assistance   Suspected metastatic gastric cancer - oncology awaiting molecular testing to determine treatment options  Cycle 1 folfox started 11/18 per oncology, had planned to discharge to  CIR Monday after chemo complete (now complicated with fever workup and anemia requiring transfusion)  Fever ? Tumor fever Hold abx for now, consider starting if recurrent or spike > 101 F 1 set of cultures  from 11/21 with gram positive cocci with staph species (suspect contaminant) Repeat blood cultures pending Follow final cultures Urine culture pending Repeat CXR today with subsegmental atelectasis.  Adenocarcinoma (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   gastric nodule bx and notable for adenocarcinoma, poorly differentiated with signet ring morphology.  Focal invastion of muscularis mucosa.  Muscularis propia not present in bx.  Per path reports, this most likely represents primary source of metastatic carcinoma dx on bx of vagina on 11/8.  Outpatient PET study pending for outpatient  Appreciate oncology and gyn onc   Anemia Worsened anemia to 6.6 today No si/sx bleeding Transfuse 1 unit pRBC and follow  Cancer related pain Will increase short acting oxycodone Continue long acting 40 mg BID Will ask palliative to reassess, appreciate assistance  LE [place] for LE pain negative.  Trial flexeril.  Increase gabapentin.  Panic attack Panic attack 11/19-20 overnight Out of body feeling, feeling of impending doom Prn benzo  Gastritis and gastroduodenitis EGD with gastritis PPI x 6 weeks then 40 mg daily bx as noted above  Colonoscopy with 5 polyps removed (1 in ascending and 4 in sigmoid) bx as noted above  Ovarian mass Noted on MRI pelvis Workup as noted above  Elevated CEA CEA 1864, CA 125 29 on admission  Abdominal pain 2/2 metastatic cancer Continue to adjust pain regimen as needed  Dysuria Follow UA and culture  Acute lower UTI-resolved as of 12/24/2020 S/p ceftriaxone Culture with strep viridans resolved   Constipation-resolved as of 12/24/2020 resolved  Malnutrition of moderate degree - Moderate malnutrition in context of acute illness/injury, morbid obesity - Evaluated by dietitian, appreciate assistance - per RD: "Moderate Malnutrition  related to acute illness as evidenced by percent weight loss, moderate muscle depletion, energy intake < 75% for > 7 days."  Hypokalemia Replace and follow  OSA (obstructive sleep apnea) CPAP  Diabetes mellitus ([place]) SSI  Essential hypertension Amlodipine, coreg, follow  Likely d/c to CIR with plan for oncology to follow up  DVT prophylaxis: lovenox Code Status: full Family Communication: niece at bedside Disposition:   Status is: Inpatient  Remains inpatient appropriate because: unsafe d/c plan       Consultants:  Oncology IR Urology GI Palliative care Gyn oncology  Procedures:  Diagnostic cystoscopy, bladder biopsy, urethral biopsy, 12/17/20 Cervical biopsies, 12/17/2020 EGD and colonoscopy 12/22/20 Port placement by IR 11/17  Antimicrobials: Anti-infectives (From admission, onward)    Start     Dose/Rate Route Frequency Ordered Stop   12/23/20 1000  cefTRIAXone (ROCEPHIN) 1 g in sodium chloride 0.9 % 100 mL IVPB        1 g 200 mL/hr over 30 Minutes Intravenous Every 24 hours 12/23/20 0746 12/24/20 1030   12/20/20 1000  cefTRIAXone (ROCEPHIN) 1 g in sodium chloride 0.9 % 100 mL IVPB        1 g 200 mL/hr over 30 Minutes Intravenous Every 24 hours 12/20/20 0752 12/22/20 1451   12/17/20 0930  ceFAZolin (ANCEF) 3 g in dextrose 5 % 50 mL IVPB  Status:  Discontinued        3 g 100 mL/hr over 30 Minutes Intravenous  Once 12/17/20 0925 12/17/20 0926  12/17/20 0915  ceFAZolin (ANCEF) IVPB 3g/100 mL premix        3 g 200 mL/hr over 30 Minutes Intravenous  Once 12/17/20 0914 12/17/20 0935       Subjective: Denies feeling fever  Objective: Vitals:   12/31/20 1428 12/31/20 1430 12/31/20 1445 12/31/20 1700  BP: 128/71 128/71 133/73 137/73  Pulse: 97 97 96 (!) 102  Resp: 20 20 18 18   Temp: 98.3 F (36.8 C) 98.3 F (36.8 C) 98.2 F (36.8 C) 99.3 F (37.4 C)  TempSrc: Oral Oral  Oral  SpO2:      Weight:      Height:        Intake/Output Summary (Last  24 hours) at 12/31/2020 2132 Last data filed at 12/31/2020 1714 Gross per 24 hour  Intake 779 ml  Output 800 ml  Net -21 ml   Filed Weights   12/16/20 1307 12/17/20 0841 12/22/20 0928  Weight: 127 kg 127 kg 127 kg    Examination:  General: No acute distress. Cardiovascular: RRR Lungs: unlabored Abdomen: Soft, nontender, nondistended Neurological: Alert and oriented 3. Moves all extremities 4. Cranial nerves II through XII grossly intact. Skin: Warm and dry. No rashes or lesions. Extremities: No clubbing or cyanosis. No edema     Data Reviewed: I have personally reviewed following labs and imaging studies  CBC: Recent Labs  Lab 12/27/20 0357 12/28/20 0535 12/29/20 0541 12/30/20 1044 12/31/20 0521  WBC 11.8* 14.3* 10.8* 9.0 7.8  NEUTROABS 8.7* 11.4* 8.8* 7.7 6.4  HGB 7.7* 8.1* 7.6* 7.9* 6.6*  HCT 25.0* 25.8* 24.3* 25.3* 20.5*  MCV 95.4 94.5 95.3 95.1 94.9  PLT 245 260 228 210 644    Basic Metabolic Panel: Recent Labs  Lab 12/26/20 0405 12/27/20 0357 12/28/20 0535 12/29/20 0541 12/30/20 1044 12/31/20 0521  NA 135 136 136 135 137 137  K 3.9 4.0 4.4 4.1 4.3 4.2  CL 105 106 104 103 104 104  CO2 23 22 25 24 26 24   GLUCOSE 130* 123* 146* 148* 125* 136*  BUN 18 16 17  24* 18 19  CREATININE 0.79 0.77 0.74 0.73 0.68 0.72  CALCIUM 9.4 9.2 9.2 9.2 9.3 8.9  MG 2.2 2.3 2.2 2.3  --  2.3  PHOS  --   --  3.6 3.3  --  3.2    GFR: Estimated Creatinine Clearance: 106.1 mL/min (by C-G formula based on SCr of 0.72 mg/dL).  Liver Function Tests: Recent Labs  Lab 12/28/20 0535 12/29/20 0541 12/30/20 1044 12/31/20 0521  AST 45* 70* 82* 55*  ALT 17 36 27 21  ALKPHOS 714* 712* 933* 738*  BILITOT 0.4 0.5 0.7 0.6  PROT 6.3* 6.1* 6.6 5.8*  ALBUMIN 2.2* 2.1* 2.1* 1.9*    CBG: Recent Labs  Lab 12/30/20 1719 12/30/20 2113 12/31/20 0815 12/31/20 1202 12/31/20 1706  GLUCAP 142* 166* 130* 138* 142*     Recent Results (from the past 240 hour(s))  Culture,  blood (routine x 2)     Status: None (Preliminary result)   Collection Time: 12/30/20 10:44 AM   Specimen: BLOOD  Result Value Ref Range Status   Specimen Description   Final    BLOOD LEFT ANTECUBITAL Performed at [facility], [place] [address] [place], [place] [address]    Special Requests   Final    BOTTLES DRAWN AEROBIC AND ANAEROBIC Blood Culture adequate volume Performed at [facility] [address] [place], [place] [address]    Culture  Setup Time   Final    GRAM POSITIVE COCCI AEROBIC BOTTLE ONLY CRITICAL RESULT CALLED TO, READ BACK BY AND VERIFIED WITH: PHARMD J GADHIA [id number] AT 844 AM BY CM Performed at [facility], [place] [address], [place], [place] [address]    Culture GRAM POSITIVE COCCI  Final   Report Status PENDING  Incomplete  Blood Culture ID Panel (Reflexed)     Status: Abnormal   Collection Time: 12/30/20 10:44 AM  Result Value Ref Range Status   Enterococcus faecalis NOT DETECTED NOT DETECTED Final   Enterococcus Faecium NOT DETECTED NOT DETECTED Final   Listeria monocytogenes NOT DETECTED NOT DETECTED Final   Staphylococcus species DETECTED (Erin [name]) NOT DETECTED Final    Comment: CRITICAL RESULT CALLED TO, READ BACK BY AND VERIFIED WITH: PHARMD J GADHIA [id number] AT 844 AM BY CM    Staphylococcus aureus (BCID) NOT DETECTED NOT DETECTED Final   Staphylococcus epidermidis NOT DETECTED NOT DETECTED Final   Staphylococcus lugdunensis NOT DETECTED NOT DETECTED Final   Streptococcus species NOT DETECTED NOT DETECTED Final   Streptococcus agalactiae NOT DETECTED NOT DETECTED Final   Streptococcus pneumoniae NOT DETECTED NOT DETECTED Final   Streptococcus pyogenes NOT DETECTED NOT DETECTED Final   Veneta Sliter.calcoaceticus-baumannii NOT DETECTED NOT DETECTED Final   Bacteroides fragilis NOT DETECTED NOT DETECTED Final   Enterobacterales NOT DETECTED NOT DETECTED Final   Enterobacter cloacae complex NOT DETECTED NOT DETECTED Final    Escherichia coli NOT DETECTED NOT DETECTED Final   Klebsiella aerogenes NOT DETECTED NOT DETECTED Final   Klebsiella oxytoca NOT DETECTED NOT DETECTED Final   Klebsiella pneumoniae NOT DETECTED NOT DETECTED Final   Proteus species NOT DETECTED NOT DETECTED Final   Salmonella species NOT DETECTED NOT DETECTED Final   Serratia marcescens NOT DETECTED NOT DETECTED Final   Haemophilus influenzae NOT DETECTED NOT DETECTED Final   Neisseria meningitidis NOT DETECTED NOT DETECTED Final   Pseudomonas aeruginosa NOT DETECTED NOT DETECTED Final   Stenotrophomonas maltophilia NOT DETECTED NOT DETECTED Final   Candida albicans NOT DETECTED NOT DETECTED Final   Candida auris NOT DETECTED NOT DETECTED Final   Candida glabrata NOT DETECTED NOT DETECTED Final   Candida krusei NOT DETECTED NOT DETECTED Final   Candida parapsilosis NOT DETECTED NOT DETECTED Final   Candida tropicalis NOT DETECTED NOT DETECTED Final   Cryptococcus neoformans/gattii NOT DETECTED NOT DETECTED Final    Comment: Performed at [facility], [place]. [address], [place], [place] [address]  Culture, blood (routine x 2)     Status: None (Preliminary result)   Collection Time: 12/30/20 10:49 AM   Specimen: BLOOD  Result Value Ref Range Status   Specimen Description   Final    BLOOD BLOOD RIGHT FOREARM Performed at [facility] [address], [place], [place] [address]    Special Requests   Final    BOTTLES DRAWN AEROBIC AND ANAEROBIC Blood Culture adequate volume Performed at [facility] [address] [place], [place] [address]    Culture   Final    NO GROWTH < 24 HOURS Performed at [facility] [address], [place],  [address]    Report Status PENDING  Incomplete          Radiology Studies: DG CHEST PORT 1 VIEW  Result Date: 12/31/2020 CLINICAL DATA:  Chest pain EXAM: PORTABLE CHEST 1 VIEW COMPARISON:  Previous studies including the examination of  12/30/2020 FINDINGS: Transverse diameter of heart is increased.  There are no signs of pulmonary edema or focal pulmonary consolidation. There are small linear densities in both mid lung fields and left lower lung fields suggesting subsegmental atelectasis with interval improvement in the left lung and possible slight worsening in the right mid lung fields. IMPRESSION: Cardiomegaly. There are no signs of pulmonary edema or focal pulmonary consolidation. There are small linear densities in the both mid and left lower lung fields suggesting subsegmental atelectasis with some improvement in the left lung. Electronically Signed   By: Elmer Picker M.D.   On: 12/31/2020 13:39   DG CHEST PORT 1 VIEW  Result Date: 12/30/2020 CLINICAL DATA:  Fever, abdominal pain EXAM: PORTABLE CHEST 1 VIEW COMPARISON:  Previous studies including the CT chest done on 12/17/2020 FINDINGS: Transverse diameter of heart is increased. There are linear densities in both parahilar regions and medial left lower lung fields suggesting scarring or subsegmental atelectasis with interval worsening in the left lower lung fields. There is no significant pleural effusion or pneumothorax. Surgical clips are seen in the right thyroid bed. IMPRESSION: Cardiomegaly. There are linear densities in both parahilar regions and left lower lung fields with interval worsening suggesting subsegmental atelectasis. There are no signs of alveolar pulmonary edema or focal pulmonary consolidation. Electronically Signed   By: Elmer Picker M.D.   On: 12/30/2020 12:04        Scheduled Meds:  amLODipine  5 mg Oral Daily   carvedilol  6.25 mg Oral BID WC   Chlorhexidine Gluconate Cloth  6 each Topical Daily   enoxaparin (LOVENOX) injection  60 mg Subcutaneous Q24H   feeding supplement  237 mL Oral TID BM   gabapentin  300 mg Oral QHS   insulin aspart  0-9 Units Subcutaneous TID WC   multivitamin with minerals  1 tablet Oral Daily   oxyCODONE  40  mg Oral Q12H   pantoprazole  40 mg Oral BID   polyethylene glycol  17 g Oral Daily   senna-docusate  1 tablet Oral BID   Continuous Infusions:   LOS: 15 days    Time spent: over 30 min    Fayrene Helper, MD [facility]   To contact the attending provider between 7A-7P or the covering provider during after hours 7P-7A, please log into the web site [email or web address] and access using universal [place] password for that web site. If you do not have the password, please call the hospital operator.  12/31/2020, 9:32 PM

## 2020-12-31 NOTE — Progress Notes (Signed)
Nutrition Follow-up  DOCUMENTATION CODES:  Non-severe (moderate) malnutrition in context of acute illness/injury, Morbid obesity  INTERVENTION:  Continue regular diet.  Increase Ensure from BID to TID.  Add MVI with minerals daily.  Encourage PO and supplement intake.  NUTRITION DIAGNOSIS:  Moderate Malnutrition related to acute illness as evidenced by percent weight loss, moderate muscle depletion, energy intake < 75% for > 7 days. - ongoing  GOAL:  Patient will meet greater than or equal to 90% of their needs. - not meeting  MONITOR:  PO intake, Supplement acceptance, Labs, Weight trends, I & O's  REASON FOR ASSESSMENT:  Malnutrition Screening Tool    ASSESSMENT:  55 y.o. female with PMH OSA on CPAP, HTN, DMII, morbid obesity who presented with worsening abdominal pain and left thigh pain.She has been undergoing workup for recently discovered cervical/urethral masses on CT in Sept 2022. 11/8 - cytoscopy 11/13 - polypectomy and biopsy; EGD; colonoscopy 11/17 - jugular port placement   Spoke with pt at bedside. Pt reports having very little appetite and having to force herself to eat. She reports liking the Ensure supplements.  Per Epic, pt has eaten an average of 30% over the last 8 meals.  Recommend increasing Ensure from BID to TID, as well as adding MVI with minerals daily.  Supplements: Ensure BID  Medications: reviewed; SSI, oxycodone PO BID, Protonix BID, miralax, Senokot BID, oxycodone PO PRN (given once today)  Labs: reviewed; CBG 128-166 (H)  Diet Order:   Diet Order             Diet regular Room service appropriate? Yes; Fluid consistency: Thin  Diet effective now                  EDUCATION NEEDS:  No education needs have been identified at this time  Skin:  Skin Assessment: Skin Integrity Issues: Skin Integrity Issues:: Incisions Incisions: Vagina, closed (11/8)  Last BM:  12/24/20 - Type 6 - OBR in place  Height:  Ht Readings from Last 1  Encounters:  12/22/20 5\' 4"  (1.626 m)   Weight:  Wt Readings from Last 1 Encounters:  12/22/20 127 kg   BMI:  Body mass index is 48.06 kg/m.  Estimated Nutritional Needs:  Kcal:  2150-2350 Protein:  85-100g Fluid:  2.1L/day  Derrel Nip, RD, LDN (she/her/hers) Clinical Inpatient Dietitian RD Pager/After-Hours/Weekend Pager # in [place]

## 2020-12-31 NOTE — Progress Notes (Signed)
Inpatient Rehab Admissions Coordinator:   Discussed case with Dr. Naaman Plummer this AM.  Pt continues to be febrile.  UA revealed moderate leukocytes (expected due to bladder tumor per oncology).  Blood cultures drawn and gram-positive cocci are present in one set.  Hgb dropped 6.6 from 7.9 yesterday to 6.6 today, BP slightly elevated, now on 1.5L O2, and pt with decreased tolerance for mobility over the last few therapy sessions.  Concern for whether we can manage pt on CIR at this time.  Per Dr. Naaman Plummer, I will f/u in a few days to see how she's doing.    Shann Medal, PT, DPT Admissions Coordinator [phone] 12/31/20  11:05 AM

## 2021-01-01 LAB — CBC
HCT: 24.5 % — ABNORMAL LOW (ref 36.0–46.0)
Hemoglobin: 7.6 g/dL — ABNORMAL LOW (ref 12.0–15.0)
MCH: 29.2 pg (ref 26.0–34.0)
MCHC: 31 g/dL (ref 30.0–36.0)
MCV: 94.2 fL (ref 80.0–100.0)
Platelets: 177 10*3/uL (ref 150–400)
RBC: 2.6 MIL/uL — ABNORMAL LOW (ref 3.87–5.11)
RDW: 15.1 % (ref 11.5–15.5)
WBC: 6.8 10*3/uL (ref 4.0–10.5)
nRBC: 0 % (ref 0.0–0.2)

## 2021-01-01 LAB — TYPE AND SCREEN
ABO/RH(D): O POS
Antibody Screen: NEGATIVE
Unit division: 0

## 2021-01-01 LAB — URINE CULTURE

## 2021-01-01 LAB — CULTURE, BLOOD (ROUTINE X 2): Special Requests: ADEQUATE

## 2021-01-01 LAB — BPAM RBC
Blood Product Expiration Date: 202212262359
ISSUE DATE / TIME: 202211221420
Unit Type and Rh: 5100

## 2021-01-01 LAB — GLUCOSE, CAPILLARY
Glucose-Capillary: 115 mg/dL — ABNORMAL HIGH (ref 70–99)
Glucose-Capillary: 136 mg/dL — ABNORMAL HIGH (ref 70–99)
Glucose-Capillary: 121 mg/dL — ABNORMAL HIGH (ref 70–99)
Glucose-Capillary: 131 mg/dL — ABNORMAL HIGH (ref 70–99)

## 2021-01-01 MED ORDER — ZINC OXIDE 40 % EX OINT
TOPICAL_OINTMENT | CUTANEOUS | Status: DC | PRN
  Filled 2021-01-01: qty 57

## 2021-01-01 NOTE — Progress Notes (Addendum)
PROGRESS NOTE    Erin [name]  GGY:[id number] DOB: [birth year] DOA: 12/16/2020 PCP: Pcp, No   Brief Narrative:  This 55 years old female with PMH significant for OSA on CPAP, HTN, DMII, morbid obesity who presented with worsening abdominal pain and left thigh pain.  She has been undergoing workup for recently discovered cervical/urethral masses on CT in Sept 2022.  Further imaging showed ongoing interval enlargement of the left adnexal area.  There was also associated retroperitoneal and pelvic adenopathy. MRI pelvis also performed on 12/05/2020 showed poorly enhancing mass centered at the urethra, solid enhancing bilateral ovarian masses, poorly enhancing mass involving left uterine cervix, and significant adenopathy involving bilateral common iliac, bilateral external iliac, bilateral inguinal lymph nodes.  Also noted to have pelvic osseous lesions concerning for metastatic disease as well. Patient underwent cystoscopy with ureteral and bladder biopsy on 12/17/2020 she also underwent cervical biopsies following this procedure.  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 PT recommended acute CIR.  Assessment & Plan:   Principal Problem:   Mass of urethra Active Problems:   Essential hypertension   Diabetes mellitus (HCC)   OSA (obstructive sleep apnea)   Abdominal pain   Ovarian mass   Hypokalemia   Malnutrition of moderate degree   Elevated CEA   Adenocarcinoma (HCC)   Metastatic adenocarcinoma of unknown origin (HCC)   Gastritis and gastroduodenitis   Gastric cancer (HCC)   Cancer related pain   Dysuria   Panic attack   Fever   Anemia   Urethral Mass: MRI showed poorly marginated enhancing 3.7 x 2.8 x 3.0 cm mass centered at urethra concerning for malignancy -- solid avidly enhancing bilateral ovarian masses, largest 2.3 cm on Rt and 8.3 cm on Lt concerning for bilateral ovarian mets -- poorly marginated enhancing  2.4x2.3x3.0 cm mass in L uterine cervix, concerning for malignancy -- bilateral common iliac, bilateral external iliac, and bilateral inguinal LAD, concerning for metastatic disease -- diffuse patchy confluent nodular replacement of the pelvic osseus structures, concerning for osseus metastatic disease (see report) - Biopsy taken during cystoscopy on 12/17/2020 - biopsy has returned on 11/11. 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  gastric nodule bx and notable for adenocarcinoma, poorly differentiated with signet ring morphology.  Focal invastion of muscularis mucosa.  Muscularis propia not present in bx.  Per path reports, this most likely represents primary source of metastatic carcinoma dx on bx of vagina on 11/8. - Oncology following, now s/p port placement 11/17 - Appreciate gyn onc assistance    Suspected metastatic gastric cancer : Oncology awaiting molecular testing to determine treatment options Cycle 1 folfox started ON 11/18 per oncology, had planned to discharge to CIR Monday after chemo complete (now complicated with fever workup and anemia requiring transfusion).   Fever: Patient has spiked fever could be related to malignancy. Hold abx for now, consider starting if recurrent or spike > 101 F 1 set of cultures from 11/21 with gram positive cocci with staph species (suspect contaminant) Repeat blood cultures pending Follow final cultures Urine culture contaminated. Repeat CXR today with subsegmental atelectasis.  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   -  gastric nodule bx and notable for adenocarcinoma, poorly differentiated with signet ring morphology.  Focal invastion of muscularis mucosa.  Muscularis propia not present in bx.  Per path reports, this most likely represents primary source of metastatic carcinoma dx on bx of vagina on 11/8. - Onco recommended Outpatient PET study  as outpatient   Normochromic normocytic anemia: Could be secondary to malignancy. Continue to monitor H&H. Transfuse 1 unit of PRBC.  Posttransfusion hemoglobin 7.6   Chronic pain syndrome: Continue short acting oxycodone. Continue long-acting 40 mg twice daily Palliative care consulted for pain management. LE [place] for LE pain negative.  Trial flexeril.  Increase gabapentin.   Panic attacks: Panic attack 11/19-20 overnight Out of body feeling, feeling of impending doom Continue Prn benzo   Gastritis: EGD with gastritis PPI x 6 weeks then 40 mg daily bx as noted above Colonoscopy with 5 polyps removed (1 in ascending and 4 in sigmoid) bx as noted above   Ovarian mass: Noted on MRI pelvis Workup as noted above   Elevated CEA: CEA 1864, CA 125 29 on admission   Acute lower UTI-resolved as of 12/24/2020 S/p ceftriaxone Culture with strep viridans resolved      Malnutrition of moderate degree - Moderate malnutrition in context of acute illness/injury, morbid obesity - Evaluated by dietitian, appreciate assistance - per RD: "Moderate Malnutrition related to acute illness as evidenced by percent weight loss, moderate muscle depletion, energy intake < 75% for > 7 days."    OSA (obstructive sleep apnea) Continue CPAP   Diabetes mellitus ([place]) Continue SSI   Essential hypertension Continue amlodipine, coreg, follow    DVT prophylaxis: Lovenox Code Status: Full code Family Communication: No family at bedside Disposition Plan:   Status is: Inpatient  Remains inpatient appropriate because: Awaiting CIR.  Consultants:   Oncology IR Urology GI Palliative care D1 oncology  Procedures:  Diagnostic cystoscopy, bladder biopsy, urethral biopsy, 12/17/20 Cervical biopsies, 12/17/2020 EGD and colonoscopy 12/22/20 Port placement by IR 11/17   Antimicrobials:  Anti-infectives (From admission, onward)    Start     Dose/Rate Route Frequency Ordered Stop   12/23/20 1000  cefTRIAXone (ROCEPHIN) 1 g in sodium chloride 0.9 % 100 mL IVPB        1 g 200 mL/hr over 30 Minutes Intravenous Every 24 hours 12/23/20 0746 12/24/20 1030   12/20/20 1000  cefTRIAXone (ROCEPHIN) 1 g in sodium chloride 0.9 % 100 mL IVPB        1 g 200 mL/hr over 30 Minutes Intravenous Every 24 hours 12/20/20 0752 12/22/20 1451   12/17/20 0930  ceFAZolin (ANCEF) 3 g in dextrose 5 % 50 mL IVPB  Status:  Discontinued        3 g 100 mL/hr over 30 Minutes Intravenous  Once 12/17/20 0925 12/17/20 0926   12/17/20 0915  ceFAZolin (ANCEF) IVPB 3g/100 mL premix        3 g 200 mL/hr over 30 Minutes Intravenous  Once 12/17/20 0914 12/17/20 0935        Subjective: Patient was seen and examined at bedside.  Overnight events noted.   Patient reports feeling better and abdomen still distended. She reports pain is reasonably controlled.  Objective: Vitals:   12/31/20 2150 12/31/20 2244 01/01/21 0504 01/01/21 1400  BP: 137/67  126/76 120/67  Pulse: 95 91 90 86  Resp: 20  16 16   Temp: 99.1 F (37.3 C)  (!) 97.1 F (36.2 C) 97.7 F (36.5 C)  TempSrc: Oral  Oral  Oral  SpO2: 95% 93% 90% 93%  Weight:      Height:        Intake/Output Summary (Last 24 hours) at 01/01/2021 1631 Last data filed at 12/31/2020 1714 Gross per 24 hour  Intake 294 ml  Output 400 ml  Net -106 ml   Filed Weights   12/16/20 1307 12/17/20 0841 12/22/20 0928  Weight: 127 kg 127 kg 127 kg    Examination:  General exam: Appears comfortable, chronically ill looking, not in any distress, deconditioned Respiratory system: Clear to auscultation. Respiratory effort  normal. Cardiovascular system: S1-S2 heard, regular rate and rhythm, no murmur. Gastrointestinal system: Abdomen is soft, mildly distended, nontender, BS +. Central nervous system: Alert and oriented x 3 . No focal neurological deficits. Extremities: Edema 1+, no cyanosis, no clubbing. Skin: No rashes, lesions or ulcers Psychiatry: Judgement and insight appear normal. Mood & affect appropriate.     Data Reviewed: I have personally reviewed following labs and imaging studies  CBC: Recent Labs  Lab 12/27/20 0357 12/28/20 0535 12/29/20 0541 12/30/20 1044 12/31/20 0521 01/01/21 0830  WBC 11.8* 14.3* 10.8* 9.0 7.8 6.8  NEUTROABS 8.7* 11.4* 8.8* 7.7 6.4  --   HGB 7.7* 8.1* 7.6* 7.9* 6.6* 7.6*  HCT 25.0* 25.8* 24.3* 25.3* 20.5* 24.5*  MCV 95.4 94.5 95.3 95.1 94.9 94.2  PLT 245 260 228 210 155 144   Basic Metabolic Panel: Recent Labs  Lab 12/26/20 0405 12/27/20 0357 12/28/20 0535 12/29/20 0541 12/30/20 1044 12/31/20 0521  NA 135 136 136 135 137 137  K 3.9 4.0 4.4 4.1 4.3 4.2  CL 105 106 104 103 104 104  CO2 23 22 25 24 26 24   GLUCOSE 130* 123* 146* 148* 125* 136*  BUN 18 16 17  24* 18 19  CREATININE 0.79 0.77 0.74 0.73 0.68 0.72  CALCIUM 9.4 9.2 9.2 9.2 9.3 8.9  MG 2.2 2.3 2.2 2.3  --  2.3  PHOS  --   --  3.6 3.3  --  3.2   GFR: Estimated Creatinine Clearance: 106.1 mL/min (by C-G formula based on SCr of 0.72 mg/dL). Liver Function Tests: Recent Labs  Lab 12/28/20 0535 12/29/20 0541 12/30/20 1044 12/31/20 0521  AST 45* 70* 82* 55*  ALT 17 36 27 21  ALKPHOS 714* 712* 933* 738*  BILITOT 0.4 0.5 0.7 0.6  PROT 6.3* 6.1* 6.6 5.8*  ALBUMIN 2.2* 2.1* 2.1* 1.9*   No results for input(s): LIPASE, AMYLASE in the last 168 hours. No results for input(s): AMMONIA in the last 168 hours. Coagulation Profile: Recent Labs  Lab 12/26/20 0405  INR 1.3*   Cardiac Enzymes: No results for input(s): CKTOTAL, CKMB, CKMBINDEX, TROPONINI in the last 168 hours. BNP (last 3  results) No results for input(s): PROBNP in the last 8760 hours. HbA1C: No results for input(s): HGBA1C in the last 72 hours. CBG: Recent Labs  Lab 12/31/20 1202 12/31/20 1706 12/31/20 2151 01/01/21 0804 01/01/21 1203  GLUCAP 138* 142* 129* 115* 136*   Lipid Profile: No results for input(s): CHOL, HDL, LDLCALC, TRIG, CHOLHDL, LDLDIRECT in the last 72 hours. Thyroid Function Tests: No results for input(s): TSH, T4TOTAL, FREET4, T3FREE, THYROIDAB in the last 72 hours. Anemia Panel: No results for input(s): VITAMINB12, FOLATE, FERRITIN, TIBC, IRON, RETICCTPCT in the last 72 hours. Sepsis Labs: No results for input(s): PROCALCITON, LATICACIDVEN in the last 168 hours.  Recent Results (from the past 240 hour(s))  Culture, blood (routine x 2)     Status: Abnormal  Collection Time: 12/30/20 10:44 AM   Specimen: BLOOD  Result Value Ref Range Status   Specimen Description   Final    BLOOD LEFT ANTECUBITAL Performed at [facility] [address], [place], [place] [address]    Special Requests   Final    BOTTLES DRAWN AEROBIC AND ANAEROBIC Blood Culture adequate volume Performed at [facility] [address], [place], [place] [address]    Culture  Setup Time   Final    GRAM POSITIVE COCCI AEROBIC BOTTLE ONLY CRITICAL RESULT CALLED TO, READ BACK BY AND VERIFIED WITH: PHARMD J GADHIA [id number] AT 57 AM BY CM    Culture (A)  Final    STAPHYLOCOCCUS HOMINIS THE SIGNIFICANCE OF ISOLATING THIS ORGANISM FROM A SINGLE SET OF BLOOD CULTURES WHEN MULTIPLE SETS ARE DRAWN IS UNCERTAIN. PLEASE NOTIFY THE MICROBIOLOGY DEPARTMENT WITHIN ONE WEEK IF SPECIATION AND SENSITIVITIES ARE REQUIRED. Performed at [facility], [place] [address] [place], [place] [address]    Report Status 01/01/2021 FINAL  Final  Blood Culture ID Panel (Reflexed)     Status: Abnormal   Collection Time: 12/30/20 10:44 AM  Result Value Ref Range Status   Enterococcus faecalis  NOT DETECTED NOT DETECTED Final   Enterococcus Faecium NOT DETECTED NOT DETECTED Final   Listeria monocytogenes NOT DETECTED NOT DETECTED Final   Staphylococcus species DETECTED (A) NOT DETECTED Final    Comment: CRITICAL RESULT CALLED TO, READ BACK BY AND VERIFIED WITH: PHARMD J GADHIA [id number] AT 844 AM BY CM    Staphylococcus aureus (BCID) NOT DETECTED NOT DETECTED Final   Staphylococcus epidermidis NOT DETECTED NOT DETECTED Final   Staphylococcus lugdunensis NOT DETECTED NOT DETECTED Final   Streptococcus species NOT DETECTED NOT DETECTED Final   Streptococcus agalactiae NOT DETECTED NOT DETECTED Final   Streptococcus pneumoniae NOT DETECTED NOT DETECTED Final   Streptococcus pyogenes NOT DETECTED NOT DETECTED Final   A.calcoaceticus-baumannii NOT DETECTED NOT DETECTED Final   Bacteroides fragilis NOT DETECTED NOT DETECTED Final   Enterobacterales NOT DETECTED NOT DETECTED Final   Enterobacter cloacae complex NOT DETECTED NOT DETECTED Final   Escherichia coli NOT DETECTED NOT DETECTED Final   Klebsiella aerogenes NOT DETECTED NOT DETECTED Final   Klebsiella oxytoca NOT DETECTED NOT DETECTED Final   Klebsiella pneumoniae NOT DETECTED NOT DETECTED Final   Proteus species NOT DETECTED NOT DETECTED Final   Salmonella species NOT DETECTED NOT DETECTED Final   Serratia marcescens NOT DETECTED NOT DETECTED Final   Haemophilus influenzae NOT DETECTED NOT DETECTED Final   Neisseria meningitidis NOT DETECTED NOT DETECTED Final   Pseudomonas aeruginosa NOT DETECTED NOT DETECTED Final   Stenotrophomonas maltophilia NOT DETECTED NOT DETECTED Final   Candida albicans NOT DETECTED NOT DETECTED Final   Candida auris NOT DETECTED NOT DETECTED Final   Candida glabrata NOT DETECTED NOT DETECTED Final   Candida krusei NOT DETECTED NOT DETECTED Final   Candida parapsilosis NOT DETECTED NOT DETECTED Final   Candida tropicalis NOT DETECTED NOT DETECTED Final   Cryptococcus neoformans/gattii NOT  DETECTED NOT DETECTED Final    Comment: Performed at [facility], [place] [address] [place], [place] [address]  Culture, blood (routine x 2)     Status: None (Preliminary result)   Collection Time: 12/30/20 10:49 AM   Specimen: BLOOD  Result Value Ref Range Status   Specimen Description   Final    BLOOD BLOOD RIGHT FOREARM Performed at [facility] [address]  [place], [place] [address]    Special Requests   Final    BOTTLES DRAWN AEROBIC AND ANAEROBIC Blood Culture adequate volume Performed at [facility] [address] [place], [place] [address]    Culture   Final    NO GROWTH 2 DAYS Performed at [facility] [address], [place], [place] [address]    Report Status PENDING  Incomplete  Urine Culture     Status: Abnormal   Collection Time: 12/30/20 12:31 PM   Specimen: Urine, Clean Catch  Result Value Ref Range Status   Specimen Description   Final    URINE, CLEAN CATCH Performed at [facility], [place] [address], [place], [place] [address]    Special Requests   Final    NONE Performed at [facility], [place] [address], [place], [place] [address]    Culture MULTIPLE SPECIES PRESENT, SUGGEST RECOLLECTION (A)  Final   Report Status 01/01/2021 FINAL  Final  Culture, blood (routine x 2)     Status: None (Preliminary result)   Collection Time: 12/31/20 12:39 PM   Specimen: Left Antecubital; Blood  Result Value Ref Range Status   Specimen Description   Final    LEFT ANTECUBITAL Performed at [facility] [address], [place], [place] [address]    Special Requests   Final    BOTTLES DRAWN AEROBIC AND ANAEROBIC Blood Culture adequate volume Performed at [facility] [address] [place], [place] [address]    Culture   Final    NO GROWTH < 24 HOURS Performed at [facility] [address], [place], [place] [address]    Report  Status PENDING  Incomplete  Culture, blood (routine x 2)     Status: None (Preliminary result)   Collection Time: 12/31/20 12:45 PM   Specimen: BLOOD LEFT HAND  Result Value Ref Range Status   Specimen Description   Final    BLOOD LEFT HAND Performed at [facility] [address], [place], [place] [address]    Special Requests   Final    BOTTLES DRAWN AEROBIC ONLY Blood Culture adequate volume Performed at [facility] [address] [place], [place] [address]    Culture   Final    NO GROWTH < 24 HOURS Performed at [facility] [address] [place], [place] [address]    Report Status PENDING  Incomplete    Radiology Studies: DG CHEST PORT 1 VIEW  Result Date: 12/31/2020 CLINICAL DATA:  Chest pain EXAM: PORTABLE CHEST 1 VIEW COMPARISON:  Previous studies including the examination of 12/30/2020 FINDINGS: Transverse diameter of heart is increased. There are no signs of pulmonary edema or focal pulmonary consolidation. There are small linear densities in both mid lung fields and left lower lung fields suggesting subsegmental atelectasis with interval improvement in the left lung and possible slight worsening in the right mid lung fields. IMPRESSION: Cardiomegaly. There are no signs of pulmonary edema or focal pulmonary consolidation. There are small linear densities in the both mid and left lower lung fields suggesting subsegmental atelectasis with some improvement in the left lung. Electronically Signed   By: Elmer Picker M.D.   On: 12/31/2020 13:39    Scheduled Meds:  amLODipine  5 mg Oral Daily   carvedilol  6.25 mg Oral BID WC   Chlorhexidine Gluconate Cloth  6 each Topical Daily   enoxaparin (LOVENOX) injection  60  mg Subcutaneous Q24H   feeding supplement  237 mL Oral TID BM   gabapentin  300 mg Oral QHS   insulin aspart  0-9 Units Subcutaneous TID WC   multivitamin with minerals  1 tablet Oral Daily   oxyCODONE  40 mg  Oral Q12H   pantoprazole  40 mg Oral BID   polyethylene glycol  17 g Oral Daily   senna-docusate  1 tablet Oral BID   Continuous Infusions:   LOS: 16 days    Time spent: 35 mins    Trinette Vera, MD [facility]   If 7PM-7AM, please contact night-coverage

## 2021-01-01 NOTE — Progress Notes (Signed)
Physical Therapy Treatment Patient Details Name: Erin [name] MRN: [id number] DOB: 1965/02/25 Today's Date: 01/01/2021   History of Present Illness Pt is 55yo female who presented on 11/7 with increasing abdominal pain and L thigh pain. Has recent hx of cervical/urethral masses found on CT 9/22, as well as urethral mass, B ovarian masses, L uterine cervical mass, and pelvis osseous lesions concerning for metastatic disease. Admitted for further w/u and biopsy. PMH HLD, HTN, OSA, DOE, DM, shoulder arthroscopy with rotator cuff repair and subacromial decompression    PT Comments    General Comments: AxO x 3 feeling "a little better" with Mother in Law in room helping her (comforting/loving) Pt was sitting EOB Indep with Mother in [place] in room on arrival. General transfer comment: assisted off elevated bed with increased time and forward weight shift.  Pt sow but steady.  Good safety cognition and use of hands to steady self. General Gait Details: increased time assisted with amb to and from bathroom.  Slow but steady.  C/O L LE weakness > Right. Slightly forward flex posture due to increased ABD pain with activity, but "better than it was".  Assisted back to bed supporting B LE's.  Pt plans to D/C to Inpt Rehab priopr to home.   Recommendations for follow up therapy are one component of a multi-disciplinary discharge planning process, led by the attending physician.  Recommendations may be updated based on patient status, additional functional criteria and insurance authorization.  Follow Up Recommendations  Acute inpatient rehab (3hours/day)     Assistance Recommended at Discharge Frequent or constant Supervision/Assistance  Equipment Recommendations  Rolling walker (2 wheels)    Recommendations for Other Services       Precautions / Restrictions Precautions Precautions: Fall Precaution Comments: METS pelvis and spine     Mobility  Bed Mobility Overal bed mobility: Needs  Assistance Bed Mobility: Sit to Supine       Sit to supine: Max assist   General bed mobility comments: Max Assist to support B LE up onto bed due to Increased ABD pain after activity    Transfers Overall transfer level: Needs assistance Equipment used: Rolling walker (2 wheels) Transfers: Sit to/from Stand;Bed to chair/wheelchair/BSC Sit to Stand: Min guard;Min assist Stand pivot transfers: Min guard;Min assist         General transfer comment: assisted ogg elevated bed with increased time and forward weight shift.  Pt sow but steady.  Good safety cognition and use of hands to steady self.    Ambulation/Gait Ambulation/Gait assistance: Supervision;Min guard Gait Distance (Feet): 18 Feet (to and from bathroom) Assistive device: Rolling walker (2 wheels) Gait Pattern/deviations: Step-to pattern;Decreased stride length;Shuffle;Trunk flexed Gait velocity: slwo     General Gait Details: increased time assisted with amb to and from bathroom.  Slow but steady.  C/O L LE weakness > Right. Slightly forward flex posture due to increased ABD pain with activity, but "better than it was".   Stairs             Wheelchair Mobility    Modified Rankin (Stroke Patients Only)       Balance                                            Cognition Arousal/Alertness: Awake/alert Behavior During Therapy: WFL for tasks assessed/performed Overall Cognitive Status: Within Functional Limits for tasks assessed  General Comments: AxO x 3 feeling "a little better" with Mother in Law in room helping her (comforting/loving)        Exercises      General Comments        Pertinent Vitals/Pain Pain Assessment: Faces Faces Pain Scale: Hurts a little bit Pain Location: pelvis area/ABD "deep" but "better" than other day. Pain Descriptors / Indicators: Discomfort;Aching Pain Intervention(s): Monitored during  session;Premedicated before session    Home Living                          Prior Function            PT Goals (current goals can now be found in the care plan section) Progress towards PT goals: Progressing toward goals    Frequency    Min 3X/week      PT Plan Current plan remains appropriate    Co-evaluation              AM-PAC PT "6 Clicks" Mobility   Outcome Measure  Help needed turning from your back to your side while in a flat bed without using bedrails?: A Lot Help needed moving from lying on your back to sitting on the side of a flat bed without using bedrails?: A Lot Help needed moving to and from a bed to a chair (including a wheelchair)?: A Little Help needed standing up from a chair using your arms (e.g., wheelchair or bedside chair)?: A Little Help needed to walk in hospital room?: A Little Help needed climbing 3-5 steps with a railing? : Total 6 Click Score: 14    End of Session Equipment Utilized During Treatment: Gait belt Activity Tolerance: Patient limited by pain;Patient limited by fatigue Patient left: in bed;with nursing/sitter in room;with family/visitor present;with bed alarm set Nurse Communication: Mobility status PT Visit Diagnosis: Unsteadiness on feet (R26.81);Difficulty in walking, not elsewhere classified (R26.2);Pain;Muscle weakness (generalized) (M62.81)     Time: 3419-3790 PT Time Calculation (min) (ACUTE ONLY): 24 min  Charges:  $Gait Training: 8-22 mins $Therapeutic Activity: 8-22 mins                     Rica Koyanagi  PTA Acute  Rehabilitation Services Pager      [phone] Office      [phone]

## 2021-01-01 NOTE — Progress Notes (Signed)
HEMATOLOGY-ONCOLOGY PROGRESS NOTE  SUBJECTIVE: She reports adequate pain control.  She has an intermittent cough.  She was transfused with 1 unit packed red cells yesterday. PHYSICAL EXAMINATION:  Vitals:   01/01/21 0504 01/01/21 1400  BP: 126/76 120/67  Pulse: 90 86  Resp: 16 16  Temp: (!) 97.1 F (36.2 C) 97.7 F (36.5 C)  SpO2: 90% 93%   Filed Weights   12/16/20 1307 12/17/20 0841 12/22/20 0928  Weight: 279 lb 15.8 oz (127 kg) 279 lb 15.8 oz (127 kg) 279 lb 15.8 oz (127 kg)    Intake/Output from previous day: 11/22 0701 - 11/23 0700 In: 539 [P.O.:240; I.V.:5; Blood:294] Out: 400 [Urine:400] ABDOMEN: Soft, tender with mild firmness in the left lower abdomen NEURO: alert & oriented x 3 with fluent speech, no focal motor/sensory deficits Vascular: No leg edema  LABORATORY DATA:  I have reviewed the data as listed CMP Latest Ref Rng & Units 12/31/2020 12/30/2020 12/29/2020  Glucose 70 - 99 mg/dL 136(H) 125(H) 148(H)  BUN 6 - 20 mg/dL 19 18 24(H)  Creatinine 0.44 - 1.00 mg/dL 0.72 0.68 0.73  Sodium 135 - 145 mmol/L 137 137 135  Potassium 3.5 - 5.1 mmol/L 4.2 4.3 4.1  Chloride 98 - 111 mmol/L 104 104 103  CO2 22 - 32 mmol/L _0 Calcium 8.9 - 10.3 mg/dL 8.9 9.3 9.2  Total Protein 6.5 - 8.1 g/dL 5.8(L) 6.6 6.1(L)  Total Bilirubin 0.3 - 1.2 mg/dL 0.6 0.7 0.5  Alkaline Phos 38 - 126 U/L 738(H) 933(H) 712(H)  AST 15 - 41 U/L 55(H) 82(H) 70(H)  ALT 0 - 44 U/L 21 27 36    Lab Results  Component Value Date   WBC 6.8 01/01/2021   HGB 7.6 (L) 01/01/2021   HCT 24.5 (L) 01/01/2021   MCV 94.2 01/01/2021   PLT 177 01/01/2021   NEUTROABS 6.4 12/31/2020    DG Chest 2 View  Result Date: 12/16/2020 CLINICAL DATA:  Shortness of breath in a 55 year old female. EXAM: CHEST - 2 VIEW COMPARISON:  July 10, 2009. FINDINGS: Linear opacities in the LEFT chest of progressed slightly since the previous study. No lobar consolidation. Cardiomediastinal contours and hilar structures are  stable. No sign of effusion. Mildly increased density in the subcarinal region on lateral projection. No acute skeletal process on limited assessment. IMPRESSION: Signs of scarring or atelectasis in the LEFT chest, slightly progressed since the previous study. Question developing subcarinal adenopathy, based on lateral projection. Developing lower lobe airspace disease is also a differential consideration. In a patient with pelvic masses on recent pelvic MRI would consider follow-up chest CT for further evaluation. Electronically Signed   By: Zetta Bills M.D.   On: 12/16/2020 14:13   CT CHEST W CONTRAST  Result Date: 12/17/2020 CLINICAL DATA:  Cancer of unknown primary.  Staging. EXAM: CT CHEST WITH CONTRAST TECHNIQUE: Multidetector CT imaging of the chest was performed during intravenous contrast administration. CONTRAST:  5m OMNIPAQUE IOHEXOL 350 MG/ML SOLN COMPARISON:  Chest CT dated 06/01/2007. FINDINGS: Cardiovascular: There is no cardiomegaly or pericardial effusion. Three-vessel coronary vascular calcification. Mild atherosclerotic calcification of the thoracic aorta. No aneurysmal dilatation. The origins of the great vessels of the aortic arch appear patent as visualized. There is dilatation of the main pulmonary trunk suggestive of pulmonary hypertension. Evaluation of the pulmonary arteries is limited due to respiratory motion artifact and suboptimal opacification and timing of the contrast. No large or central pulmonary artery embolus identified. Mediastinum/Nodes: No hilar or  mediastinal adenopathy. The esophagus is grossly unremarkable. Prior right hemithyroidectomy. No mediastinal fluid collection. Lungs/Pleura: Bibasilar subpleural atelectasis/scarring. There is no pleural effusion pneumothorax. The central airways are patent. Upper Abdomen: Indeterminate 3 cm left renal nodule, present on the prior CT, likely a benign etiology such as adenoma. Musculoskeletal: Degenerative changes of the  spine. No acute osseous pathology. IMPRESSION: 1. No acute intrathoracic pathology. No CT evidence of central pulmonary artery embolus. 2. Dilatation of the main pulmonary trunk suggestive of pulmonary hypertension. 3. Status post prior right hemithyroidectomy. 4. Bilateral linear atelectasis/scarring. 5. Aortic Atherosclerosis (ICD10-I70.0). Electronically Signed   By: Anner Crete M.D.   On: 12/17/2020 21:09   MR Lumbar Spine W Wo Contrast  Result Date: 12/16/2020 CLINICAL DATA:  Initial evaluation for low back pain, cancer suspected. EXAM: MRI LUMBAR SPINE WITHOUT AND WITH CONTRAST TECHNIQUE: Multiplanar and multiecho pulse sequences of the lumbar spine were obtained without and with intravenous contrast. CONTRAST:  59m GADAVIST GADOBUTROL 1 MMOL/ML IV SOLN COMPARISON:  Prior CT from earlier the same day. FINDINGS: Segmentation: Standard. Lowest well-formed disc space labeled the L5-S1 level. Alignment: 4 mm facet mediated anterolisthesis of L4 on L5. Alignment otherwise normal with preservation of the normal lumbar lordosis. Vertebrae: Diffusely abnormal appearance of the bone marrow is seen throughout the visualized lumbar spine and pelvis. Associated heterogeneous STIR hyperintensity with irregular heterogeneous postcontrast enhancement. Findings are highly suspicious for diffuse osseous metastatic disease. Involvement appears to be most pronounced within the T12 vertebral body as well as the visualized pelvis. Exact measurements of a discrete lesion is difficult given the overall infiltrative appearance of this finding. No associated pathologic fracture. No visible extra osseous extension of tumor at this time. Conus medullaris and cauda equina: Conus extends to the L1 level. Conus and cauda equina appear normal. No visible epidural or intracanalicular tumor. Paraspinal and other soft tissues: Mild edema within the subcutaneous fat of the lower back, which could be related to overall volume status.  Paraspinous soft tissues demonstrate no other acute finding. Retroperitoneal/iliac adenopathy noted. Left larger than right adnexal masses partially visualized. Findings better characterized on prior CT. Disc levels: L1-2:  Unremarkable. L2-3: Disc desiccation with mild disc bulge. Superimposed small right foraminal to extraforaminal disc protrusion (series 11, image 17). Mild facet hypertrophy. Underlying short pedicles with a degree of mild spinal stenosis. Mild bilateral L2 foraminal narrowing. L3-4: Disc desiccation with mild disc bulge. Superimposed shallow left extraforaminal disc protrusion with annular fissure (series 11, image 24). Mild facet hypertrophy. Underlying short pedicles. Mild spinal stenosis. Foramina remain patent. L4-5: 4 mm anterolisthesis. Disc desiccation with broad-based posterior pseudo disc bulge. Biforaminal annular fissures noted, left larger than right. Moderate bilateral facet arthrosis. Resultant moderate spinal stenosis. Mild to moderate bilateral L4 foraminal narrowing. L5-S1: Normal interspace. Mild right greater than left facet hypertrophy. No stenosis. IMPRESSION: 1. 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 No associated pathologic fracture or extra osseous extension of tumor at this time. 2. Retroperitoneal/iliac adenopathy with left larger than right adnexal masses, better characterized on prior CT. 3. Underlying multilevel degenerative spondylosis as above, most pronounced at L4-5 where there is resultant moderate spinal stenosis. Electronically Signed   By: BJeannine BogaM.D.   On: 12/16/2020 23:44   MR PELVIS W WO CONTRAST  Result Date: 12/05/2020 CLINICAL DATA:  55year old female with indeterminate hypoechoic left adnexal mass identified in the setting of left lower quadrant pain. EXAM:  MRI PELVIS WITHOUT AND WITH CONTRAST TECHNIQUE: Multiplanar  multisequence MR imaging of the pelvis was performed both before and after administration of intravenous contrast. CONTRAST:  61m GADAVIST GADOBUTROL 1 MMOL/ML IV SOLN COMPARISON:  12/01/2020 pelvic sonogram and unenhanced CT abdomen/pelvis. FINDINGS: Urinary Tract: Mild diffuse bladder wall thickening. There is a poorly marginated enhancing solid 3.7 x 2.8 x 3.0 cm mass centered at the urethra (series 32/image 42 and series 30/image 67), extending from the vesicourethral junction throughout the length of the urethra. There appears to be an underlying right periurethral diverticulum (series 5/image 27). At the inferior tip of the urethra, there is a simple 0.9 x 0.9 x 0.9 cm cystic lesion compatible with a Skene's duct cyst. Bowel: Visualized small and large bowel are normal caliber with no bowel wall thickening. Vascular/Lymphatic: Mild bilateral inguinal lymphadenopathy measuring up to 1.4 cm short axis diameter on the right (series 19/image 71) and 1.4 cm on the left (series 19/image 70). Moderate bilateral external iliac lymphadenopathy measuring up to 1.7 cm bilaterally (series 19/image 53 on the right and 56 on the left). Bilateral common iliac lymphadenopathy, largest 1.6 cm on the right (series 19/image 27) and 1.7 cm on the left (series 19/image 14). Reproductive: Uterus: The anteverted uterus measures 8.2 x 3.7 x 5.5 cm. No uterine fibroids. Inner myometrium (junctional zone) measures 12 mm in thickness, which is compatible with mild diffuse uterine adenomyosis. Endometrium measures 4 mm in bilayer thickness, which is within normal limits. No endometrial cavity fluid or focal endometrial mass. Poorly marginated enhancing solid mass of the left uterine cervix measuring 2.4 x 2.3 x 3.0 cm (series 5/image 21 and series 8/image 22), which appears to disrupt the normal cervical fibrous stroma without frank parametrial invasion. Ovaries and Adnexa: The right ovary measures 4.0 x 2.4 x 4.3 cm and contains  multiple solid avidly enhancing masses, largest 2.3 x 2.0 cm (series 19/image 33). The left ovary measures 8.3 x 7.7 x 8.5 cm and is replaced by a predominantly solid avidly enhancing mass. Other: No abnormal free fluid in the pelvis. No focal pelvic fluid collection. Musculoskeletal: Diffuse patchy confluent nodular replacement of the pelvic osseous structures, best seen on axial series 7. IMPRESSION: 1. Poorly marginated enhancing 3.7 x 2.8 x 3.0 cm mass centered at the urethra, extending from the vesicourethral junction throughout the length of the female urethra, with probable underlying right periurethral diverticulum. This mass is concerning for malignancy. 2. Solid avidly enhancing bilateral ovarian masses, largest 2.3 cm on the right and 8.3 cm on the left, worrisome for bilateral ovarian metastases. 3. Poorly marginated enhancing 2.4 x 2.3 x 3.0 cm mass in the left uterine cervix, which appears to disrupt the normal cervical fibrous stroma without frank parametrial invasion, suspicious for malignancy. 4. Mild-to-moderate bilateral common iliac, bilateral external iliac and bilateral inguinal lymphadenopathy, suspicious for metastatic disease. 5. Diffuse patchy confluent nodular replacement of the pelvic osseous structures, suspicious for osseous metastatic disease. 6. Mild diffuse uterine adenomyosis. Electronically Signed   By: JIlona SorrelM.D.   On: 12/05/2020 20:31   [place]  Result Date: 12/16/2020 CLINICAL DATA:  Pelvic pain EXAM: TRANSABDOMINAL ULTRASOUND OF PELVIS DOPPLER ULTRASOUND OF OVARIES TECHNIQUE: Transabdominal ultrasound examination of the pelvis was performed including evaluation of the uterus, ovaries, adnexal regions, and pelvic cul-de-sac. Color and duplex Doppler ultrasound was utilized to evaluate blood flow to the ovaries. COMPARISON:  CT 12/16/2020, MRI 12/05/2020, pelvic ultrasound 12/01/2020 FINDINGS: Uterus Measurements: 6.8 x 3.4 x 5 cm =  volume: 60.5 mL. No  fibroids or other mass visualized. Endometrium Poorly visible, unable to measure. Right ovary Measurements: 4.8 x 2.9 x 5.5 cm = volume: 38.6 mL. Solid right adnexal mass replaces most of right ovary. Left ovary Measurements: 10.5 x 8.7 x 9.2 cm = volume: 440 mL. Enlarged left ovary with heterogenous solid mass that replaces the left ovary. Previously the left ovary measured 8.3 x 7.7 x 8.5 cm on MRI. Pulsed Doppler evaluation demonstrates normal arterial and venous waveforms in both or adnexa, though note that both ovaries are essentially replaced by mass lesions. Other: No significant ascites. Foley catheter within the bladder which appears somewhat thick walled. IMPRESSION: 1. Very limited exam secondary to habitus and only trans abdominal technique 2. Bilateral solid adnexal masses essentially replacing the ovaries which thereby limits assessment of the ovaries. Left adnexal solid mass lesion has significantly increased in size; documented flow in the adnexa relates to flow within the solid adnexal masses. Gynecology follow-up recommended. Electronically Signed   By: Donavan Foil M.D.   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 EXAM: CT ABDOMEN AND PELVIS WITH CONTRAST TECHNIQUE: Multidetector CT imaging of the abdomen and pelvis was performed using the standard protocol following bolus administration of intravenous contrast. CONTRAST:  27m OMNIPAQUE IOHEXOL 350 MG/ML SOLN COMPARISON:  Comparison is made with multiple prior studies which were performed recently, most recent comparison is an MRI of the pelvis of December 05, 2020. FINDINGS: Lower chest: Basilar atelectasis. Subcarinal region not well evaluated. No consolidation or effusion at the lung bases. Hepatobiliary: No focal, suspicious hepatic lesion. No pericholecystic stranding. Portal vein is  patent. No biliary duct dilation. Pancreas: Pancreas with normal contours. No signs of adjacent inflammation. Spleen: Spleen normal size and contour. Adrenals/Urinary Tract: Well-circumscribed LEFT adrenal lesion, density value of 19 Hounsfield units on previous noncontrast imaging measuring 3.2 x 2.6 cm., relative washout is indeterminate at 30%. Normal RIGHT adrenal. Normal, symmetric enhancement of bilateral kidneys without focal renal lesion. Stomach/Bowel: Small hiatal hernia. No stranding adjacent to the stomach. No sign of small bowel obstruction or acute small bowel process. The appendix is normal. Stool in various parts of the colon without signs of obstruction or adjacent stranding. Vascular/Lymphatic: Retroperitoneal adenopathy (image 50/2) 14 mm lymph node previously 11 mm along the LEFT common iliac chain. LEFT pelvic sidewall lymph node (image 75/2) 14 mm short axis, previously 13 mm. Similar size of RIGHT pelvic sidewall/external iliac lymph nodes also with little change compared to the most recent comparison imaging. Reproductive: Foley catheter in situ. Uterus and cervix not well at assess nor are the masses within the area of the cervix and urethra seen on previous MRI. Increasing size of LEFT ovary with heterogeneous appearance now measuring 10 x 8.8 cm. When assessed on the study of October 20, 2020 the ovary for reference measured 3.7 x 2.5 cm. RIGHT ovary with area of enhancement shows enlargement as well but not as pronounced as on previous imaging with 2.8 cm area of enhancement in the RIGHT ovary as the largest discrete area which is similar to the recent MRI evaluation. As compared to the study of October 20, 2020 there is little change in the appearance of the RIGHT ovary aside from this area though there is an increasingly nodular appearance of the ovary in this location. Mass posterior to the LEFT  ovary just above the uterus measuring 4.4 x 2.8 cm has enlarged from 2.5 cm. Other:  Small volume ascites. Musculoskeletal: Heterogeneous pattern associated with the spine in terms of density raises the question of metastatic disease without discrete measurable focal lesion in the spine. For example a geographic area of sclerosis on image 101/5) in the anterior L3 vertebral body does raise the question of underlying lesion with numerous additional foci, essentially nearly all levels of the spine with some degree of heterogeneity. Another example of this finding is noted on the RIGHT at the T10 level. No acute bone finding or frankly destructive bone process. IMPRESSION: Rapid increase in size of the LEFT ovary with some adjacent ascites. The rapid enlargement is seen in the setting of generalized worsening of masses associated with the LEFT and RIGHT ovary but perhaps more pronounced than other areas in the pelvis. The possibility of torsion associated with an ovarian mass could be considered based on this finding. Would correlate with any worsening abdominal pain and with sonogram as warranted for further assessment. Enlarging masses elsewhere though not to the extent that is seen with the LEFT ovary. Masslike area of the cervix and potentially within a urethral diverticulum not as well seen as on the recent MRI evaluation. Small volume ascites. Well-circumscribed LEFT adrenal lesion measuring 3.2 x 2.6 cm, density value of 19 Hounsfield units on previous noncontrast imaging. Relative washout is indeterminate at 30%. This may represent an adrenal adenoma. Consider dedicated adrenal protocol CT or attention on PET evaluation if performed. Heterogeneous pattern of subtle sclerosis and lucency in the spine on today's study raising the question of bony metastatic disease in the spine. Findings of increased ovarian size on the LEFT out of proportion other areas were called by telephone at the time of interpretation on 12/16/2020 at 5:44 pm to provider Theodis Blaze , who verbally acknowledged these  results. Electronically Signed   By: Zetta Bills M.D.   On: 12/16/2020 17:46   [place] Art/Ven Flow Abd Pelv Doppler  Result Date: 12/16/2020 CLINICAL DATA:  Pelvic pain EXAM: TRANSABDOMINAL ULTRASOUND OF PELVIS DOPPLER ULTRASOUND OF OVARIES TECHNIQUE: Transabdominal ultrasound examination of the pelvis was performed including evaluation of the uterus, ovaries, adnexal regions, and pelvic cul-de-sac. Color and duplex Doppler ultrasound was utilized to evaluate blood flow to the ovaries. COMPARISON:  CT 12/16/2020, MRI 12/05/2020, pelvic ultrasound 12/01/2020 FINDINGS: Uterus Measurements: 6.8 x 3.4 x 5 cm = volume: 60.5 mL. No fibroids or other mass visualized. Endometrium Poorly visible, unable to measure. Right ovary Measurements: 4.8 x 2.9 x 5.5 cm = volume: 38.6 mL. Solid right adnexal mass replaces most of right ovary. Left ovary Measurements: 10.5 x 8.7 x 9.2 cm = volume: 440 mL. Enlarged left ovary with heterogenous solid mass that replaces the left ovary. Previously the left ovary measured 8.3 x 7.7 x 8.5 cm on MRI. Pulsed Doppler evaluation demonstrates normal arterial and venous waveforms in both or adnexa, though note that both ovaries are essentially replaced by mass lesions. Other: No significant ascites. Foley catheter within the bladder which appears somewhat thick walled. IMPRESSION: 1. Very limited exam secondary to habitus and only trans abdominal technique 2. Bilateral solid adnexal masses essentially replacing the ovaries which thereby limits assessment of the ovaries. Left adnexal solid mass lesion has significantly increased in size; documented flow in the adnexa relates to flow within the solid adnexal masses. Gynecology follow-up recommended. Electronically Signed   By: Donavan Foil M.D.   On: 12/16/2020 19:23  DG CHEST PORT 1 VIEW  Result Date: 12/31/2020 CLINICAL DATA:  Chest pain EXAM: PORTABLE CHEST 1 VIEW COMPARISON:  Previous studies including the examination of 12/30/2020  FINDINGS: Transverse diameter of heart is increased. There are no signs of pulmonary edema or focal pulmonary consolidation. There are small linear densities in both mid lung fields and left lower lung fields suggesting subsegmental atelectasis with interval improvement in the left lung and possible slight worsening in the right mid lung fields. IMPRESSION: Cardiomegaly. There are no signs of pulmonary edema or focal pulmonary consolidation. There are small linear densities in the both mid and left lower lung fields suggesting subsegmental atelectasis with some improvement in the left lung. Electronically Signed   By: Elmer Picker M.D.   On: 12/31/2020 13:39   DG CHEST PORT 1 VIEW  Result Date: 12/30/2020 CLINICAL DATA:  Fever, abdominal pain EXAM: PORTABLE CHEST 1 VIEW COMPARISON:  Previous studies including the CT chest done on 12/17/2020 FINDINGS: Transverse diameter of heart is increased. There are linear densities in both parahilar regions and medial left lower lung fields suggesting scarring or subsegmental atelectasis with interval worsening in the left lower lung fields. There is no significant pleural effusion or pneumothorax. Surgical clips are seen in the right thyroid bed. IMPRESSION: Cardiomegaly. There are linear densities in both parahilar regions and left lower lung fields with interval worsening suggesting subsegmental atelectasis. There are no signs of alveolar pulmonary edema or focal pulmonary consolidation. Electronically Signed   By: Elmer Picker M.D.   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 EXAM: IMPLANTED PORT A CATH PLACEMENT WITH ULTRASOUND AND FLUOROSCOPIC GUIDANCE COMPARISON:  None. MEDICATIONS: None. ANESTHESIA/SEDATION: Moderate (conscious) sedation was employed during this  procedure. A total of Versed 3 mg and Fentanyl 100 mcg was administered intravenously. Moderate Sedation Time: 19 minutes. The patient's level of consciousness and vital signs were monitored continuously by radiology nursing throughout the procedure under my direct supervision. CONTRAST:  None FLUOROSCOPY TIME:  0 minutes, 12 seconds (5 mGy) COMPLICATIONS: None immediate. PROCEDURE: The procedure, risks, benefits, and alternatives were explained to the patient. Questions regarding the procedure were encouraged and answered. The patient understands and consents to the procedure. The right neck and chest were prepped with chlorhexidine in a sterile fashion, and a sterile drape was applied covering the operative field. Maximum barrier sterile technique with sterile gowns and gloves were used for the procedure. A timeout was performed prior to the initiation of the procedure. Ultrasound was used to examine the jugular vein which was compressible and free of internal echoes. A skin marker was used to demarcate the planned venotomy and port pocket incision sites. Local anesthesia was provided to these sites and the subcutaneous tunnel track with 1% lidocaine with 1:100,000 epinephrine. A small incision was created at the jugular access site and blunt dissection was performed of the subcutaneous tissues. Under ultrasound guidance, the jugular vein was accessed with a 21 ga micropuncture needle and an 0.018" wire was inserted to the superior vena cava. Real-time ultrasound guidance was utilized for vascular access including the acquisition of a permanent ultrasound image documenting patency of the accessed vessel. A 5 Fr micopuncture set was then used, through which a 0.035" Rosen wire was passed under fluoroscopic guidance into the inferior vena cava. An 8 Fr dilator was then placed over the wire. A  subcutaneous port pocket was then created along the upper chest wall utilizing a combination of sharp and blunt dissection.  The pocket was irrigated with sterile saline, packed with gauze, and observed for hemorrhage. A single lumen "ISP" sized power injectable port was chosen for placement. The 8 Fr catheter was tunneled from the port pocket site to the venotomy incision. The port was placed in the pocket. The external catheter was trimmed to appropriate length. The dilator was exchanged for an 8 Fr peel-away sheath under fluoroscopic guidance. The catheter was then placed through the sheath and the sheath was removed. Final catheter positioning was confirmed and documented with a fluoroscopic spot radiograph. The port was accessed with a Huber needle, aspirated, and flushed with heparinized saline. The deep dermal layer of the port pocket incision was closed with interrupted 3-0 Vicryl suture. The skin was opposed with a running subcuticular 4-0 Monocryl suture. Dermabond was then placed over the port pocket and neck incisions. The patient tolerated the procedure well without immediate post procedural complication. FINDINGS: After catheter placement, the tip lies within the superior cavoatrial junction. The catheter aspirates and flushes normally and is ready for immediate use. IMPRESSION: Successful placement of a power injectable Port-A-Cath via the right internal jugular vein. The catheter is ready for immediate use. Ruthann Cancer, MD [facility] Electronically Signed   By: Ruthann Cancer M.D.   On: 12/26/2020 11:08   VAS [place] LOWER EXTREMITY VENOUS (DVT)  Result Date: 12/29/2020  Lower Venous DVT Study Patient Name:  LARHONDA DETTLOFF Kincer  Date of Exam:   12/29/2020 Medical Rec #: [id number]            Accession #:    [id number] Date of Birth: Jun 12, 1965            Patient Gender: F Patient Age:   12 years Exam Location:  [facility] Procedure:      VAS [place] LOWER EXTREMITY VENOUS (DVT) Referring Phys: A POWELL JR  --------------------------------------------------------------------------------  Indications: Pain.  Risk Factors: Cancer. Limitations: Body habitus and poor ultrasound/tissue interface. Comparison Study: No prior studies. Performing Technologist: Oliver Hum RVT  Examination Guidelines: A complete evaluation includes B-mode imaging, spectral Doppler, color Doppler, and power Doppler as needed of all accessible portions of each vessel. Bilateral testing is considered an integral part of a complete examination. Limited examinations for reoccurring indications may be performed as noted. The reflux portion of the exam is performed with the patient in reverse Trendelenburg.  +---------+---------------+---------+-----------+----------+--------------+ RIGHT    CompressibilityPhasicitySpontaneityPropertiesThrombus Aging +---------+---------------+---------+-----------+----------+--------------+ CFV      Full           Yes      Yes                                 +---------+---------------+---------+-----------+----------+--------------+ SFJ      Full                                                        +---------+---------------+---------+-----------+----------+--------------+ FV Prox  Full                                                        +---------+---------------+---------+-----------+----------+--------------+  FV Mid   Full                                                        +---------+---------------+---------+-----------+----------+--------------+ FV DistalFull                                                        +---------+---------------+---------+-----------+----------+--------------+ PFV      Full                                                        +---------+---------------+---------+-----------+----------+--------------+ POP      Full           Yes      Yes                                  +---------+---------------+---------+-----------+----------+--------------+ PTV      Full                                                        +---------+---------------+---------+-----------+----------+--------------+ PERO     Full                                                        +---------+---------------+---------+-----------+----------+--------------+   +---------+---------------+---------+-----------+----------+--------------+ LEFT     CompressibilityPhasicitySpontaneityPropertiesThrombus Aging +---------+---------------+---------+-----------+----------+--------------+ CFV      Full           Yes      Yes                                 +---------+---------------+---------+-----------+----------+--------------+ SFJ      Full                                                        +---------+---------------+---------+-----------+----------+--------------+ FV Prox  Full                                                        +---------+---------------+---------+-----------+----------+--------------+ FV Mid   Full                                                        +---------+---------------+---------+-----------+----------+--------------+  FV DistalFull                                                        +---------+---------------+---------+-----------+----------+--------------+ PFV      Full                                                        +---------+---------------+---------+-----------+----------+--------------+ POP      Full           Yes      Yes                                 +---------+---------------+---------+-----------+----------+--------------+ PTV      Full                                                        +---------+---------------+---------+-----------+----------+--------------+ PERO     Full                                                         +---------+---------------+---------+-----------+----------+--------------+     Summary: RIGHT: - There is no evidence of deep vein thrombosis in the lower extremity.  - No cystic structure found in the popliteal fossa.  LEFT: - There is no evidence of deep vein thrombosis in the lower extremity.  - No cystic structure found in the popliteal fossa.  *See table(s) above for measurements and observations. Electronically signed by Jamelle Haring on 12/29/2020 at 12:00:16 PM.    Final     ASSESSMENT AND PLAN: 1.  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 -12/05/2020 MRI of the pelvis-poorly marginated enhancing 3.7 x 2.8 x 3.0 cm mass centered at the urethra, solid avidly enhancing bilateral ovarian masses, poorly marginated enhancing 2.4 x 2.3 x 3.0 cm mass in the left uterine cervix, mild to moderate bilateral common iliac, bilateral external iliac, and bilateral inguinal lymphadenopathy, diffuse patchy confluent nodular replacement of the pelvic osseous structures. -12/13/2020 CEA 1864, CA125 29.2 -12/16/2020 CT abdomen/pelvis-rapidly increasing size of the left ovary with some adjacent ascites, enlarging masses elsewhere, masslike area of the cervix and potentially within a urethral diverticulum, well-circumscribed left adrenal lesion measuring 3.2 x 2.6 cm, heterogeneous pattern of subtle sclerosis and lucency in the spine. -12/16/2020 MRI of the lumbar spine-diffusely abnormal appearance of the bone marrow throughout the visualized lumbar spine and pelvis highly suspicious for diffuse osseous metastatic disease, retroperitoneal/iliac adenopathy with left larger than right adnexal masses. 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  Abdominal pain secondary #1  3.  Constipation 4.  Normocytic anemia 5.  Leukocytosis 6.  Protein calorie malnutrition 7.  Obstructive sleep apnea 8.  Diabetes mellitus 9.  Hypertension 10.  Possible pulmonary hypertension noted on CT chest  Ms. Amoroso appears unchanged.  She is now afebrile.  The hemoglobin improved following the transfusion yesterday.  Her pain is adequately controlled.  A single blood culture returned positive for staph hominis, this may be a contaminant. She can be transferred to the inpatient rehabilitation service versus outpatient rehab in a SNF if she remains afebrile.   Recommendations: 1.  Continue current pain medications. 2.  Plan for outpatient PET scan  3.  We will follow-up on molecular testing, HER2, and PD-L1 results. 4.  No need for daily CBC 5.  Incentive spirometer ordered. 6.  I will check on her 01/03/2021, please call oncology as needed  Future Appointments  Date Time Provider [place]  02/18/2021 10:45 AM [name], PA-C [facility]      LOS: 16 days   Betsy Coder, MD 01/01/21

## 2021-01-02 LAB — GLUCOSE, CAPILLARY
Glucose-Capillary: 114 mg/dL — ABNORMAL HIGH (ref 70–99)
Glucose-Capillary: 140 mg/dL — ABNORMAL HIGH (ref 70–99)
Glucose-Capillary: 94 mg/dL (ref 70–99)
Glucose-Capillary: 118 mg/dL — ABNORMAL HIGH (ref 70–99)

## 2021-01-02 LAB — CBC
HCT: 22.7 % — ABNORMAL LOW (ref 36.0–46.0)
Hemoglobin: 7 g/dL — ABNORMAL LOW (ref 12.0–15.0)
MCH: 29.5 pg (ref 26.0–34.0)
MCHC: 30.8 g/dL (ref 30.0–36.0)
MCV: 95.8 fL (ref 80.0–100.0)
Platelets: 150 10*3/uL (ref 150–400)
RBC: 2.37 MIL/uL — ABNORMAL LOW (ref 3.87–5.11)
RDW: 14.9 % (ref 11.5–15.5)
WBC: 6.1 10*3/uL (ref 4.0–10.5)
nRBC: 0.3 % — ABNORMAL HIGH (ref 0.0–0.2)

## 2021-01-02 LAB — BASIC METABOLIC PANEL
Anion gap: 7 (ref 5–15)
BUN: 22 mg/dL — ABNORMAL HIGH (ref 6–20)
CO2: 24 mmol/L (ref 22–32)
Calcium: 8.8 mg/dL — ABNORMAL LOW (ref 8.9–10.3)
Chloride: 104 mmol/L (ref 98–111)
Creatinine, Ser: 0.72 mg/dL (ref 0.44–1.00)
GFR, Estimated: >60 mL/min (ref >60)
Glucose, Bld: 123 mg/dL — ABNORMAL HIGH (ref 70–99)
Potassium: 4.1 mmol/L (ref 3.5–5.1)
Sodium: 135 mmol/L (ref 135–145)

## 2021-01-02 MED ORDER — LACTULOSE 10 GM/15ML PO SOLN
15.0000 g | Freq: Two times a day (BID) | ORAL | Status: AC
  Administered 2021-01-02: 15 g via ORAL
  Filled 2021-01-02: qty 30

## 2021-01-02 NOTE — Progress Notes (Signed)
PROGRESS NOTE    Erin [name]  HBZ:[id number] DOB: December 13, 1965 DOA: 12/16/2020 PCP: Pcp, No   Brief Narrative:  This 55 years old female with PMH significant for OSA on CPAP, HTN, DMII, morbid obesity who presented with worsening abdominal pain and left thigh pain.  She has been undergoing workup for recently discovered cervical/urethral masses on CT in Sept 2022.  Further imaging showed ongoing interval enlargement of the left adnexal area.  There was also associated retroperitoneal and pelvic adenopathy. MRI pelvis also performed on 12/05/2020 showed poorly enhancing mass centered at the urethra, solid enhancing bilateral ovarian masses, poorly enhancing mass involving left uterine cervix, and significant adenopathy involving bilateral common iliac, bilateral external iliac, bilateral inguinal lymph nodes.  Also noted to have pelvic osseous lesions concerning for metastatic disease as well. Patient underwent cystoscopy with ureteral and bladder biopsy on 12/17/2020 she also underwent cervical biopsies following this procedure.  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 PT recommended acute CIR.  Assessment & Plan:   Principal Problem:   Mass of urethra Active Problems:   Essential hypertension   Diabetes mellitus (HCC)   OSA (obstructive sleep apnea)   Abdominal pain   Ovarian mass   Hypokalemia   Malnutrition of moderate degree   Elevated CEA   Adenocarcinoma (HCC)   Metastatic adenocarcinoma of unknown origin (HCC)   Gastritis and gastroduodenitis   Gastric cancer (HCC)   Cancer related pain   Dysuria   Panic attack   Fever   Anemia   Urethral Mass: MRI showed poorly marginated enhancing 3.7 x 2.8 x 3.0 cm mass centered at urethra concerning for malignancy -- solid avidly enhancing bilateral ovarian masses, largest 2.3 cm on Rt and 8.3 cm on Lt concerning for bilateral ovarian mets -- poorly marginated enhancing  2.4x2.3x3.0 cm mass in L uterine cervix, concerning for malignancy -- bilateral common iliac, bilateral external iliac, and bilateral inguinal LAD, concerning for metastatic disease -- diffuse patchy confluent nodular replacement of the pelvic osseus structures, concerning for osseus metastatic disease (see report) - Biopsy taken during cystoscopy on 12/17/2020 - biopsy has returned on 11/11. 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  gastric nodule bx and notable for adenocarcinoma, poorly differentiated with signet ring morphology.  Focal invastion of muscularis mucosa.  Muscularis propia not present in bx.  Per path reports, this most likely represents primary source of metastatic carcinoma dx on bx of vagina on 11/8. - Oncology following, now s/p port placement 11/17 - Appreciate gyn onc assistance    Suspected metastatic gastric cancer : Oncology awaiting molecular testing to determine treatment options. Cycle 1 folfox started ON 11/18 per oncology, had planned to discharge to CIR Monday after chemo complete (now complicated with fever workup and anemia requiring transfusion).   Fever: Patient has spiked fever could be related to malignancy. Hold abx for now, consider starting if recurrent or spike > 101 F 1 set of cultures from 11/21 with gram positive cocci with staph species (suspect contaminant) Repeat blood cultures NGTD Follow final cultures Urine culture contaminated. Repeat CXR today with subsegmental atelectasis.  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  -  gastric nodule bx and notable for adenocarcinoma, poorly differentiated with signet ring morphology.  Focal invastion of muscularis mucosa.  Muscularis propia not present in bx.  Per path reports, this most likely represents primary source of metastatic carcinoma dx on bx of vagina on 11/8. - Onco recommended Outpatient PET study  as outpatient   Normochromic normocytic anemia: Could be secondary to malignancy. Continue to monitor H&H. Transfuse 1 unit of PRBC.  Posttransfusion hemoglobin 7.6>7.0   Chronic pain syndrome: Continue short acting oxycodone. Continue long-acting 40 mg twice daily Palliative care consulted for pain management. LE [place] for LE pain negative.  Trial flexeril.  Increase gabapentin.   Panic attacks: Panic attack 11/19-20 overnight Out of body feeling, feeling of impending doom Continue Prn benzo   Gastritis: EGD with gastritis PPI x 6 weeks then 40 mg daily bx as noted above. Colonoscopy with 5 polyps removed (1 in ascending and 4 in sigmoid) bx as noted above   Ovarian mass: Noted on MRI pelvis Workup as noted above.   Elevated CEA: CEA 1864, CA 125 29 on admission   Acute lower UTI-resolved as of 12/24/2020 S/p ceftriaxone Culture with strep viridans resolved      Malnutrition of moderate degree - Moderate malnutrition in context of acute illness/injury, morbid obesity - Evaluated by dietitian, appreciate assistance - per RD: "Moderate Malnutrition related to acute illness as evidenced by percent weight loss, moderate muscle depletion, energy intake < 75% for > 7 days."    OSA (obstructive sleep apnea) Continue CPAP.   Diabetes mellitus ([place]) Continue SSI   Essential hypertension Continue amlodipine, coreg    DVT prophylaxis: Lovenox Code Status: Full code Family Communication: No family at bedside Disposition Plan:   Status is: Inpatient  Remains inpatient appropriate because: Awaiting CIR.  Consultants:   Oncology IR Urology GI Palliative care D1 oncology  Procedures:  Diagnostic cystoscopy, bladder biopsy, urethral biopsy, 12/17/20 Cervical biopsies, 12/17/2020 EGD and colonoscopy 12/22/20 Port placement by IR 11/17   Antimicrobials:  Anti-infectives (From admission, onward)    Start     Dose/Rate Route Frequency Ordered Stop   12/23/20 1000  cefTRIAXone (ROCEPHIN) 1 g in sodium chloride 0.9 % 100 mL IVPB        1 g 200 mL/hr over 30 Minutes Intravenous Every 24 hours 12/23/20 0746 12/24/20 1030   12/20/20 1000  cefTRIAXone (ROCEPHIN) 1 g in sodium chloride 0.9 % 100 mL IVPB        1 g 200 mL/hr over 30 Minutes Intravenous Every 24 hours 12/20/20 0752 12/22/20 1451   12/17/20 0930  ceFAZolin (ANCEF) 3 g in dextrose 5 % 50 mL IVPB  Status:  Discontinued        3 g 100 mL/hr over 30 Minutes Intravenous  Once 12/17/20 0925 12/17/20 0926   12/17/20 0915  ceFAZolin (ANCEF) IVPB 3g/100 mL premix        3 g 200 mL/hr over 30 Minutes Intravenous  Once 12/17/20 0914 12/17/20 0935        Subjective: Patient was seen and examined at bedside.  Overnight events noted.   She reports pain is reasonably controlled.  Her abdomen is still distended.  Objective: Vitals:   01/01/21 1400 01/01/21 2050 01/01/21 2312 01/02/21 0511  BP: 120/67  140/87 132/78  Pulse: 86 81 93 87  Resp: 16 16 20 19   Temp: 97.7 F (36.5 C)  (!) 97.4 F (36.3 C) 99 F (37.2 C)  TempSrc: Oral  Oral Oral  SpO2:  93%  95% 92%  Weight:      Height:        Intake/Output Summary (Last 24 hours) at 01/02/2021 1329 Last data filed at 01/02/2021 0160 Gross per 24 hour  Intake --  Output 1500 ml  Net -1500 ml   Filed Weights   12/16/20 1307 12/17/20 0841 12/22/20 0928  Weight: 127 kg 127 kg 127 kg    Examination:  General exam: Appears comfortable, chronically ill looking, not in any distress, deconditioned Respiratory system: Clear to auscultation. Respiratory effort normal. RR 17 Cardiovascular  system: S1-S2 heard, regular rate and rhythm, no murmur. Gastrointestinal system: Abdomen is soft, mildly distended, nontender, BS +. Central nervous system: Alert and oriented x 3 . No focal neurological deficits. Extremities: Edema 1+, no cyanosis, no clubbing. Skin: No rashes, lesions or ulcers Psychiatry: Judgement and insight appear normal. Mood & affect appropriate.     Data Reviewed: I have personally reviewed following labs and imaging studies  CBC: Recent Labs  Lab 12/27/20 0357 12/28/20 0535 12/29/20 0541 12/30/20 1044 12/31/20 0521 01/01/21 0830 01/02/21 0550  WBC 11.8* 14.3* 10.8* 9.0 7.8 6.8 6.1  NEUTROABS 8.7* 11.4* 8.8* 7.7 6.4  --   --   HGB 7.7* 8.1* 7.6* 7.9* 6.6* 7.6* 7.0*  HCT 25.0* 25.8* 24.3* 25.3* 20.5* 24.5* 22.7*  MCV 95.4 94.5 95.3 95.1 94.9 94.2 95.8  PLT 245 260 228 210 155 177 109   Basic Metabolic Panel: Recent Labs  Lab 12/27/20 0357 12/28/20 0535 12/29/20 0541 12/30/20 1044 12/31/20 0521 01/02/21 0550  NA 136 136 135 137 137 135  K 4.0 4.4 4.1 4.3 4.2 4.1  CL 106 104 103 104 104 104  CO2 22 25 24 26 24 24   GLUCOSE 123* 146* 148* 125* 136* 123*  BUN 16 17 24* 18 19 22*  CREATININE 0.77 0.74 0.73 0.68 0.72 0.72  CALCIUM 9.2 9.2 9.2 9.3 8.9 8.8*  MG 2.3 2.2 2.3  --  2.3  --   PHOS  --  3.6 3.3  --  3.2  --    GFR: Estimated Creatinine Clearance: 106.1 mL/min (by C-G formula based on SCr of 0.72 mg/dL). Liver Function Tests: Recent Labs  Lab 12/28/20 0535 12/29/20 0541 12/30/20 1044 12/31/20 0521  AST 45* 70* 82* 55*  ALT 17 36 27 21  ALKPHOS 714* 712* 933* 738*  BILITOT 0.4 0.5 0.7 0.6  PROT 6.3* 6.1* 6.6 5.8*  ALBUMIN 2.2* 2.1* 2.1* 1.9*   No results for input(s): LIPASE, AMYLASE in the last 168 hours. No results for input(s): AMMONIA in the last 168 hours. Coagulation Profile: No results for input(s): INR, PROTIME in the last 168 hours.  Cardiac Enzymes: No results for input(s): CKTOTAL, CKMB, CKMBINDEX, TROPONINI in  the last 168 hours. BNP (last 3 results) No results for input(s): PROBNP in the last 8760 hours. HbA1C: No results for input(s): HGBA1C in the last 72 hours. CBG: Recent Labs  Lab 01/01/21 1203 01/01/21 1706 01/01/21 2314 01/02/21 0814 01/02/21 1126  GLUCAP 136* 121* 131* 114* 140*   Lipid Profile: No results for input(s): CHOL, HDL, LDLCALC, TRIG, CHOLHDL, LDLDIRECT in the last 72 hours. Thyroid Function Tests: No results for input(s): TSH, T4TOTAL, FREET4, T3FREE, THYROIDAB in the last 72 hours. Anemia Panel: No results for input(s): VITAMINB12, FOLATE, FERRITIN, TIBC, IRON, RETICCTPCT in the last 72 hours. Sepsis Labs: No results for input(s): PROCALCITON, LATICACIDVEN in the last 168 hours.  Recent Results (from the past 240 hour(s))  Culture,  blood (routine x 2)     Status: Abnormal   Collection Time: 12/30/20 10:44 AM   Specimen: BLOOD  Result Value Ref Range Status   Specimen Description   Final    BLOOD LEFT ANTECUBITAL Performed at [facility] [address] [place], [place] [address]    Special Requests   Final    BOTTLES DRAWN AEROBIC AND ANAEROBIC Blood Culture adequate volume Performed at [facility] [address] [place], [place] [address]    Culture  Setup Time   Final    GRAM POSITIVE COCCI AEROBIC BOTTLE ONLY CRITICAL RESULT CALLED TO, READ BACK BY AND VERIFIED WITH: PHARMD J GADHIA [id number] AT 82 AM BY CM    Culture (A)  Final    STAPHYLOCOCCUS HOMINIS THE SIGNIFICANCE OF ISOLATING THIS ORGANISM FROM A SINGLE SET OF BLOOD CULTURES WHEN MULTIPLE SETS ARE DRAWN IS UNCERTAIN. PLEASE NOTIFY THE MICROBIOLOGY DEPARTMENT WITHIN ONE WEEK IF SPECIATION AND SENSITIVITIES ARE REQUIRED. Performed at [facility], [place] [address], [place], [place] [address]    Report Status 01/01/2021 FINAL  Final  Blood Culture ID Panel (Reflexed)     Status: Abnormal   Collection Time: 12/30/20 10:44 AM  Result Value Ref Range  Status   Enterococcus faecalis NOT DETECTED NOT DETECTED Final   Enterococcus Faecium NOT DETECTED NOT DETECTED Final   Listeria monocytogenes NOT DETECTED NOT DETECTED Final   Staphylococcus species DETECTED (A) NOT DETECTED Final    Comment: CRITICAL RESULT CALLED TO, READ BACK BY AND VERIFIED WITH: PHARMD J GADHIA [id number] AT 844 AM BY CM    Staphylococcus aureus (BCID) NOT DETECTED NOT DETECTED Final   Staphylococcus epidermidis NOT DETECTED NOT DETECTED Final   Staphylococcus lugdunensis NOT DETECTED NOT DETECTED Final   Streptococcus species NOT DETECTED NOT DETECTED Final   Streptococcus agalactiae NOT DETECTED NOT DETECTED Final   Streptococcus pneumoniae NOT DETECTED NOT DETECTED Final   Streptococcus pyogenes NOT DETECTED NOT DETECTED Final   A.calcoaceticus-baumannii NOT DETECTED NOT DETECTED Final   Bacteroides fragilis NOT DETECTED NOT DETECTED Final   Enterobacterales NOT DETECTED NOT DETECTED Final   Enterobacter cloacae complex NOT DETECTED NOT DETECTED Final   Escherichia coli NOT DETECTED NOT DETECTED Final   Klebsiella aerogenes NOT DETECTED NOT DETECTED Final   Klebsiella oxytoca NOT DETECTED NOT DETECTED Final   Klebsiella pneumoniae NOT DETECTED NOT DETECTED Final   Proteus species NOT DETECTED NOT DETECTED Final   Salmonella species NOT DETECTED NOT DETECTED Final   Serratia marcescens NOT DETECTED NOT DETECTED Final   Haemophilus influenzae NOT DETECTED NOT DETECTED Final   Neisseria meningitidis NOT DETECTED NOT DETECTED Final   Pseudomonas aeruginosa NOT DETECTED NOT DETECTED Final   Stenotrophomonas maltophilia NOT DETECTED NOT DETECTED Final   Candida albicans NOT DETECTED NOT DETECTED Final   Candida auris NOT DETECTED NOT DETECTED Final   Candida glabrata NOT DETECTED NOT DETECTED Final   Candida krusei NOT DETECTED NOT DETECTED Final   Candida parapsilosis NOT DETECTED NOT DETECTED Final   Candida tropicalis NOT DETECTED NOT DETECTED Final    Cryptococcus neoformans/gattii NOT DETECTED NOT DETECTED Final    Comment: Performed at [facility], [place] [address] [place], [place] [address]  Culture, blood (routine x 2)     Status: None (Preliminary result)   Collection Time: 12/30/20 10:49 AM   Specimen: BLOOD  Result Value Ref Range Status   Specimen Description   Final    BLOOD BLOOD  RIGHT FOREARM Performed at [facility], [place] [address], [place], [place] [address]    Special Requests   Final    BOTTLES DRAWN AEROBIC AND ANAEROBIC Blood Culture adequate volume Performed at [facility] [address] [place], [place] [address]    Culture   Final    NO GROWTH 2 DAYS Performed at [facility] [address], [place], [place] [address]    Report Status PENDING  Incomplete  Urine Culture     Status: Abnormal   Collection Time: 12/30/20 12:31 PM   Specimen: Urine, Clean Catch  Result Value Ref Range Status   Specimen Description   Final    URINE, CLEAN CATCH Performed at [facility], [place] [address], [place], [place] [address]    Special Requests   Final    NONE Performed at [facility], [place] [address] [place], [place] [address]    Culture MULTIPLE SPECIES PRESENT, SUGGEST RECOLLECTION (A)  Final   Report Status 01/01/2021 FINAL  Final  Culture, blood (routine x 2)     Status: None (Preliminary result)   Collection Time: 12/31/20 12:39 PM   Specimen: Left Antecubital; Blood  Result Value Ref Range Status   Specimen Description   Final    LEFT ANTECUBITAL Performed at [facility] [address] [place], [place] [address]    Special Requests   Final    BOTTLES DRAWN AEROBIC AND ANAEROBIC Blood Culture adequate volume Performed at [facility] [address] [place], [place] [address]    Culture   Final    NO GROWTH < 24 HOURS Performed at [facility] [address]  [place], [place] [address]    Report Status PENDING  Incomplete  Culture, blood (routine x 2)     Status: None (Preliminary result)   Collection Time: 12/31/20 12:45 PM   Specimen: BLOOD LEFT HAND  Result Value Ref Range Status   Specimen Description   Final    BLOOD LEFT HAND Performed at [facility] [address] [place], [place] [address]    Special Requests   Final    BOTTLES DRAWN AEROBIC ONLY Blood Culture adequate volume Performed at [facility] [address] [place], [place] [address]    Culture   Final    NO GROWTH < 24 HOURS Performed at [facility] [address], [place], [place] [address]    Report Status PENDING  Incomplete    Radiology Studies: No results found.  Scheduled Meds:  amLODipine  5 mg Oral Daily   carvedilol  6.25 mg Oral BID WC   Chlorhexidine Gluconate Cloth  6 each Topical Daily   enoxaparin (LOVENOX) injection  60 mg Subcutaneous Q24H   feeding supplement  237 mL Oral TID BM   gabapentin  300 mg Oral QHS   insulin aspart  0-9 Units Subcutaneous TID WC   lactulose  15 g Oral BID   multivitamin with minerals  1 tablet Oral Daily   oxyCODONE  40 mg Oral Q12H   pantoprazole  40 mg Oral BID   polyethylene glycol  17 g Oral Daily   senna-docusate  1 tablet Oral BID   Continuous Infusions:   LOS: 17 days    Time spent: 25 mins    Shawna Clamp, MD [facility]   If 7PM-7AM, please contact night-coverage

## 2021-01-03 LAB — BASIC METABOLIC PANEL
Anion gap: 6 (ref 5–15)
BUN: 17 mg/dL (ref 6–20)
CO2: 25 mmol/L (ref 22–32)
Calcium: 9 mg/dL (ref 8.9–10.3)
Chloride: 107 mmol/L (ref 98–111)
Creatinine, Ser: 0.61 mg/dL (ref 0.44–1.00)
GFR, Estimated: >60 mL/min (ref >60)
Glucose, Bld: 108 mg/dL — ABNORMAL HIGH (ref 70–99)
Potassium: 4 mmol/L (ref 3.5–5.1)
Sodium: 138 mmol/L (ref 135–145)

## 2021-01-03 LAB — GLUCOSE, CAPILLARY
Glucose-Capillary: 104 mg/dL — ABNORMAL HIGH (ref 70–99)
Glucose-Capillary: 170 mg/dL — ABNORMAL HIGH (ref 70–99)
Glucose-Capillary: 136 mg/dL — ABNORMAL HIGH (ref 70–99)
Glucose-Capillary: 116 mg/dL — ABNORMAL HIGH (ref 70–99)

## 2021-01-03 MED ORDER — SODIUM CHLORIDE 0.9% FLUSH: 10.0000 mL | INTRAVENOUS | Status: DC | PRN

## 2021-01-03 MED ORDER — SODIUM CHLORIDE 0.9% FLUSH
10.0000 mL | Freq: Two times a day (BID) | INTRAVENOUS | Status: DC
  Administered 2021-01-03 – 2021-01-14 (×21): 10 mL

## 2021-01-03 MED ORDER — OXYCODONE HCL 5 MG PO TABS
5.0000 mg | ORAL_TABLET | ORAL | Status: DC | PRN
  Administered 2021-01-03 – 2021-01-14 (×42): 10 mg via ORAL
  Filled 2021-01-03 (×44): qty 2

## 2021-01-03 NOTE — Progress Notes (Signed)
Inpatient Rehab Admissions Coordinator:   I will have no beds over the weekend.  Will discuss with Dr. Naaman Plummer on Monday AM.    Shann Medal, PT, DPT Admissions Coordinator [phone] 01/03/21  10:51 AM

## 2021-01-03 NOTE — Progress Notes (Signed)
Physical Therapy Treatment Patient Details Name: Erin [name] MRN: [id number] DOB: [birth year] Today's Date: 01/03/2021   History of Present Illness This 55 years old female with PMH significant for OSA on CPAP, HTN, DMII, morbid obesity who presented with worsening abdominal pain and left thigh pain.   She has been undergoing workup for recently discovered cervical/urethral masses on CT in Sept 2022.  Further imaging showed ongoing interval enlargement of the left adnexal area.  There was also associated retroperitoneal and pelvic adenopathy.  MRI pelvis also performed on 12/05/2020 showed poorly enhancing mass centered at the urethra, solid enhancing bilateral ovarian masses, poorly enhancing mass involving left uterine cervix, and significant adenopathy involving bilateral common iliac, bilateral external iliac, bilateral inguinal lymph nodes.  Also noted to have pelvic osseous lesions concerning for metastatic disease as well.  Patient underwent cystoscopy with ureteral and bladder biopsy on 12/17/2020 she also underwent cervical biopsies following this procedure.  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    PT Comments    General Comments: AxO x 3 very pleasant and motivated to "get better" and "get back to life". General transfer comment: assisted off elevated bed with increased time and forward weight shift.  Pt sow but steady.  Good safety cognition and use of hands to steady self.  Also assissted off evelavted toilet seat with 25% VC's on proper hand placement.General Gait Details: assisted with amb to bathroom then an increased distance in hallway.  Slow but steady gait.  Mod lean on walker due to ABD pain.  Otherwise, tolerated well.    Recommendations for follow up therapy are one component of a multi-disciplinary discharge planning process, led by the attending physician.  Recommendations may be updated based on patient  status, additional functional criteria and insurance authorization.  Follow Up Recommendations  Acute inpatient rehab (3hours/day)     Assistance Recommended at Discharge Frequent or constant Supervision/Assistance  Equipment Recommendations  Rolling walker (2 wheels)    Recommendations for Other Services       Precautions / Restrictions Precautions Precautions: Fall Precaution Comments: METS pelvis and spine     Mobility  Bed Mobility               General bed mobility comments: sitting EOB on arrival    Transfers Overall transfer level: Needs assistance Equipment used: Rolling walker (2 wheels) Transfers: Sit to/from Stand;Bed to chair/wheelchair/BSC Sit to Stand: Min guard;Min assist Stand pivot transfers: Min guard;Min assist         General transfer comment: assisted off elevated bed with increased time and forward weight shift.  Pt sow but steady.  Good safety cognition and use of hands to steady self.  Also assissted off evelavted toilet seat with 25% VC's on proper hand placement.    Ambulation/Gait Ambulation/Gait assistance: Supervision;Min guard Gait Distance (Feet): 25 Feet Assistive device: Rolling walker (2 wheels) Gait Pattern/deviations: Step-to pattern;Decreased stride length;Shuffle;Trunk flexed Gait velocity: decreased     General Gait Details: assisted with amb to bathroom then an increased distance in hallway.  Slow but steady gait.  Mod lean on walker due to ABD pain.  Otherwise, tolerated well.   Stairs             Wheelchair Mobility    Modified Rankin (Stroke Patients Only)       Balance  Cognition Arousal/Alertness: Awake/alert Behavior During Therapy: WFL for tasks assessed/performed Overall Cognitive Status: Within Functional Limits for tasks assessed                                 General Comments: AxO x 3 very pleasant and motivated  to "get better" and "get back to life".        Exercises      General Comments        Pertinent Vitals/Pain Pain Assessment: Faces Faces Pain Scale: Hurts even more Pain Location: pelvis area/ABD "deep" but "better" than other day. Pain Descriptors / Indicators: Discomfort;Aching Pain Intervention(s): Monitored during session;Repositioned    Home Living                          Prior Function            PT Goals (current goals can now be found in the care plan section) Progress towards PT goals: Progressing toward goals    Frequency    Min 3X/week      PT Plan Current plan remains appropriate    Co-evaluation              AM-PAC PT "6 Clicks" Mobility   Outcome Measure  Help needed turning from your back to your side while in a flat bed without using bedrails?: A Lot Help needed moving from lying on your back to sitting on the side of a flat bed without using bedrails?: A Lot Help needed moving to and from a bed to a chair (including a wheelchair)?: A Little Help needed standing up from a chair using your arms (e.g., wheelchair or bedside chair)?: A Little Help needed to walk in hospital room?: A Lot Help needed climbing 3-5 steps with a railing? : A Lot 6 Click Score: 14    End of Session Equipment Utilized During Treatment: Gait belt Activity Tolerance: Patient tolerated treatment well Patient left: in bed;with nursing/sitter in room;with bed alarm set Nurse Communication: Mobility status PT Visit Diagnosis: Unsteadiness on feet (R26.81);Difficulty in walking, not elsewhere classified (R26.2);Pain;Muscle weakness (generalized) (M62.81)     Time: 1117-3567 PT Time Calculation (min) (ACUTE ONLY): 29 min  Charges:  $Gait Training: 8-22 mins $Therapeutic Activity: 8-22 mins                     {Napolean Sia  PTA Acute  Rehabilitation Services Pager      [phone] Office      [phone]

## 2021-01-03 NOTE — Progress Notes (Signed)
PROGRESS NOTE    Erin [name]  JJK:[id number] DOB: 22-Jul-1965 DOA: 12/16/2020 PCP: Pcp, No   Brief Narrative:  This 55 years old female with PMH significant for OSA on CPAP, HTN, DMII, morbid obesity who presented with worsening abdominal pain and left thigh pain.  She has been undergoing workup for recently discovered cervical/urethral masses on CT in Sept 2022.  Further imaging showed ongoing interval enlargement of the left adnexal area.  There was also associated retroperitoneal and pelvic adenopathy. MRI pelvis also performed on 12/05/2020 showed poorly enhancing mass centered at the urethra, solid enhancing bilateral ovarian masses, poorly enhancing mass involving left uterine cervix, and significant adenopathy involving bilateral common iliac, bilateral external iliac, bilateral inguinal lymph nodes.  Also noted to have pelvic osseous lesions concerning for metastatic disease as well. Patient underwent cystoscopy with ureteral and bladder biopsy on 12/17/2020 she also underwent cervical biopsies following this procedure.  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 PT recommended acute CIR.  Assessment & Plan:   Principal Problem:   Mass of urethra Active Problems:   Essential hypertension   Diabetes mellitus (HCC)   OSA (obstructive sleep apnea)   Abdominal pain   Ovarian mass   Hypokalemia   Malnutrition of moderate degree   Elevated CEA   Adenocarcinoma (HCC)   Metastatic adenocarcinoma of unknown origin (HCC)   Gastritis and gastroduodenitis   Gastric cancer (HCC)   Cancer related pain   Dysuria   Panic attack   Fever   Anemia   Urethral Mass: MRI showed poorly marginated enhancing 3.7 x 2.8 x 3.0 cm mass centered at urethra concerning for malignancy -- solid avidly enhancing bilateral ovarian masses, largest 2.3 cm on Rt and 8.3 cm on Lt concerning for bilateral ovarian mets -- poorly marginated enhancing  2.4x2.3x3.0 cm mass in L uterine cervix, concerning for malignancy -- bilateral common iliac, bilateral external iliac, and bilateral inguinal LAD, concerning for metastatic disease -- diffuse patchy confluent nodular replacement of the pelvic osseus structures, concerning for osseus metastatic disease (see report) - Biopsy taken during cystoscopy on 12/17/2020 - biopsy has returned on 11/11. 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  gastric nodule bx and notable for adenocarcinoma, poorly differentiated with signet ring morphology.  Focal invastion of muscularis mucosa.  Muscularis propia not present in bx.  Per path reports, this most likely represents primary source of metastatic carcinoma dx on bx of vagina on 11/8. - Oncology following, now s/p port placement 11/17 - Appreciate gyn onc assistance    Suspected metastatic gastric cancer : Oncology awaiting molecular testing to determine treatment options. Cycle 1 folfox started ON 11/18 per oncology, had planned to discharge to CIR Monday after chemo complete (now complicated with fever workup and anemia requiring transfusion).   Fever: > Resolved. Patient has spiked fever could be related to malignancy. Hold abx for now, consider starting if recurrent or spike > 101 F 1 set of cultures from 11/21 with gram positive cocci with staph species (suspect contaminant) Repeat blood cultures NGTD Urine culture contaminated. Repeat CXR with subsegmental atelectasis.  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  - gastric  nodule bx  and notable for adenocarcinoma, poorly differentiated with signet ring morphology.  Focal invastion of muscularis mucosa.  Muscularis propia not present in bx.  Per path reports, this most likely represents primary source of metastatic carcinoma dx on bx of vagina on 11/8. - Onco recommended Outpatient PET study  as outpatient   Normochromic normocytic anemia: Could be secondary to malignancy. Continue to monitor H&H. Transfuse 1 unit of PRBC.  Posttransfusion hemoglobin 7.6>7.0   Chronic pain syndrome: Continue long-acting 40 mg twice daily. Reduce short acting oxycodone to 10 mg every 6 as needed Palliative care consulted for pain management. LE [place] for LE pain negative.  Trial flexeril.  Increase gabapentin.   Panic attacks: Panic attack 11/19-20 overnight Out of body feeling, feeling of impending doom Continue Prn benzo   Gastritis: EGD with gastritis PPI x 6 weeks then 40 mg daily bx as noted above. Colonoscopy with 5 polyps removed (1 in ascending and 4 in sigmoid) bx as noted above   Ovarian mass: Noted on MRI pelvis Workup as noted above.   Elevated CEA: CEA 1864, CA 125 29 on admission   Acute lower UTI-resolved as of 12/24/2020 S/p ceftriaxone Culture with strep viridans resolved      Malnutrition of moderate degree - Moderate malnutrition in context of acute illness/injury, morbid obesity - Evaluated by dietitian, appreciate assistance - per RD: "Moderate Malnutrition related to acute illness as evidenced by percent weight loss, moderate muscle depletion, energy intake < 75% for > 7 days."    OSA (obstructive sleep apnea) Continue CPAP.   Diabetes mellitus ([place]) Continue SSI   Essential hypertension Continue amlodipine, coreg    DVT prophylaxis: Lovenox Code Status: Full code Family Communication: No family at bedside Disposition Plan:   Status is: Inpatient  Remains inpatient appropriate because: Patient is medically clear.  Awaiting  CIR.  Consultants:  Oncology IR Urology GI Palliative care D1 oncology  Procedures:  Diagnostic cystoscopy, bladder biopsy, urethral biopsy, 12/17/20 Cervical biopsies, 12/17/2020 EGD and colonoscopy 12/22/20 Port placement by IR 11/17   Antimicrobials:  Anti-infectives (From admission, onward)    Start     Dose/Rate Route Frequency Ordered Stop   12/23/20 1000  cefTRIAXone (ROCEPHIN) 1 g in sodium chloride 0.9 % 100 mL IVPB        1 g 200 mL/hr over 30 Minutes Intravenous Every 24 hours 12/23/20 0746 12/24/20 1030   12/20/20 1000  cefTRIAXone (ROCEPHIN) 1 g in sodium chloride 0.9 % 100 mL IVPB        1 g 200 mL/hr over 30 Minutes Intravenous Every 24 hours 12/20/20 0752 12/22/20 1451   12/17/20 0930  ceFAZolin (ANCEF) 3 g in dextrose 5 % 50 mL IVPB  Status:  Discontinued        3 g 100 mL/hr over 30 Minutes Intravenous  Once 12/17/20 0925 12/17/20 0926   12/17/20 0915  ceFAZolin (ANCEF) IVPB 3g/100 mL premix        3 g 200 mL/hr over 30 Minutes Intravenous  Once 12/17/20 0914 12/17/20 0935        Subjective: Patient was seen and examined at bedside.  Overnight events noted.   She reports pain is reasonably controlled but reports seems oversedated.  Her abdomen is still distended. She was sitting on the bedside having breakfast.  Objective: Vitals:   01/02/21 2027 01/02/21 2232 01/03/21 0427 01/03/21 1355  BP:  (!) 145/82 139/76 122/79  Pulse: 95 91 90 93  Resp: 17 18 16  17  Temp:  98.2 F (36.8 C) 98.2 F (36.8 C) 97.9 F (36.6 C)  TempSrc:  Oral Oral Oral  SpO2: 96% 97% 95% 93%  Weight:      Height:        Intake/Output Summary (Last 24 hours) at 01/03/2021 1415 Last data filed at 01/03/2021 1300 Gross per 24 hour  Intake 775 ml  Output 800 ml  Net -25 ml   Filed Weights   12/16/20 1307 12/17/20 0841 12/22/20 0928  Weight: 127 kg 127 kg 127 kg    Examination:  General exam: Comfortable. chronically ill looking, not in any distress,  deconditioned Respiratory system: Clear to auscultation. Respiratory effort normal. RR 17 Cardiovascular system: S1-S2 heard, regular rate and rhythm, no murmur. Gastrointestinal system: Abdomen is soft, mildly distended, nontender, BS +. Central nervous system: Alert and oriented x 3 . No focal neurological deficits. Extremities: Edema 1+, no cyanosis, no clubbing. Skin: No rashes, lesions or ulcers Psychiatry: Judgement and insight appear normal. Mood & affect appropriate.     Data Reviewed: I have personally reviewed following labs and imaging studies  CBC: Recent Labs  Lab 12/28/20 0535 12/29/20 0541 12/30/20 1044 12/31/20 0521 01/01/21 0830 01/02/21 0550  WBC 14.3* 10.8* 9.0 7.8 6.8 6.1  NEUTROABS 11.4* 8.8* 7.7 6.4  --   --   HGB 8.1* 7.6* 7.9* 6.6* 7.6* 7.0*  HCT 25.8* 24.3* 25.3* 20.5* 24.5* 22.7*  MCV 94.5 95.3 95.1 94.9 94.2 95.8  PLT 260 228 210 155 177 425   Basic Metabolic Panel: Recent Labs  Lab 12/28/20 0535 12/29/20 0541 12/30/20 1044 12/31/20 0521 01/02/21 0550 01/03/21 0620  NA 136 135 137 137 135 138  K 4.4 4.1 4.3 4.2 4.1 4.0  CL 104 103 104 104 104 107  CO2 25 24 26 24 24 25   GLUCOSE 146* 148* 125* 136* 123* 108*  BUN 17 24* 18 19 22* 17  CREATININE 0.74 0.73 0.68 0.72 0.72 0.61  CALCIUM 9.2 9.2 9.3 8.9 8.8* 9.0  MG 2.2 2.3  --  2.3  --   --   PHOS 3.6 3.3  --  3.2  --   --    GFR: Estimated Creatinine Clearance: 106.1 mL/min (by C-G formula based on SCr of 0.61 mg/dL). Liver Function Tests: Recent Labs  Lab 12/28/20 0535 12/29/20 0541 12/30/20 1044 12/31/20 0521  AST 45* 70* 82* 55*  ALT 17 36 27 21  ALKPHOS 714* 712* 933* 738*  BILITOT 0.4 0.5 0.7 0.6  PROT 6.3* 6.1* 6.6 5.8*  ALBUMIN 2.2* 2.1* 2.1* 1.9*   No results for input(s): LIPASE, AMYLASE in the last 168 hours. No results for input(s): AMMONIA in the last 168 hours. Coagulation Profile: No results for input(s): INR, PROTIME in the last 168 hours.  Cardiac  Enzymes: No results for input(s): CKTOTAL, CKMB, CKMBINDEX, TROPONINI in the last 168 hours. BNP (last 3 results) No results for input(s): PROBNP in the last 8760 hours. HbA1C: No results for input(s): HGBA1C in the last 72 hours. CBG: Recent Labs  Lab 01/02/21 1126 01/02/21 1613 01/02/21 2229 01/03/21 0830 01/03/21 1142  GLUCAP 140* 94 118* 104* 170*   Lipid Profile: No results for input(s): CHOL, HDL, LDLCALC, TRIG, CHOLHDL, LDLDIRECT in the last 72 hours. Thyroid Function Tests: No results for input(s): TSH, T4TOTAL, FREET4, T3FREE, THYROIDAB in the last 72 hours. Anemia Panel: No results for input(s): VITAMINB12, FOLATE, FERRITIN, TIBC, IRON, RETICCTPCT in the last 72 hours. Sepsis Labs: No results for  input(s): PROCALCITON, LATICACIDVEN in the last 168 hours.  Recent Results (from the past 240 hour(s))  Culture, blood (routine x 2)     Status: Abnormal   Collection Time: 12/30/20 10:44 AM   Specimen: BLOOD  Result Value Ref Range Status   Specimen Description   Final    BLOOD LEFT ANTECUBITAL Performed at [facility] [address] [place], [place] [address]    Special Requests   Final    BOTTLES DRAWN AEROBIC AND ANAEROBIC Blood Culture adequate volume Performed at [facility] [address], [place], [place] [address]    Culture  Setup Time   Final    GRAM POSITIVE COCCI AEROBIC BOTTLE ONLY CRITICAL RESULT CALLED TO, READ BACK BY AND VERIFIED WITH: PHARMD J GADHIA [id number] AT 44 AM BY CM    Culture (A)  Final    STAPHYLOCOCCUS HOMINIS THE SIGNIFICANCE OF ISOLATING THIS ORGANISM FROM A SINGLE SET OF BLOOD CULTURES WHEN MULTIPLE SETS ARE DRAWN IS UNCERTAIN. PLEASE NOTIFY THE MICROBIOLOGY DEPARTMENT WITHIN ONE WEEK IF SPECIATION AND SENSITIVITIES ARE REQUIRED. Performed at [facility], [place] [address], [place], [place] [address]    Report Status 01/01/2021 FINAL  Final  Blood Culture ID Panel (Reflexed)     Status:  Abnormal   Collection Time: 12/30/20 10:44 AM  Result Value Ref Range Status   Enterococcus faecalis NOT DETECTED NOT DETECTED Final   Enterococcus Faecium NOT DETECTED NOT DETECTED Final   Listeria monocytogenes NOT DETECTED NOT DETECTED Final   Staphylococcus species DETECTED (A) NOT DETECTED Final    Comment: CRITICAL RESULT CALLED TO, READ BACK BY AND VERIFIED WITH: PHARMD J GADHIA [id number] AT 844 AM BY CM    Staphylococcus aureus (BCID) NOT DETECTED NOT DETECTED Final   Staphylococcus epidermidis NOT DETECTED NOT DETECTED Final   Staphylococcus lugdunensis NOT DETECTED NOT DETECTED Final   Streptococcus species NOT DETECTED NOT DETECTED Final   Streptococcus agalactiae NOT DETECTED NOT DETECTED Final   Streptococcus pneumoniae NOT DETECTED NOT DETECTED Final   Streptococcus pyogenes NOT DETECTED NOT DETECTED Final   A.calcoaceticus-baumannii NOT DETECTED NOT DETECTED Final   Bacteroides fragilis NOT DETECTED NOT DETECTED Final   Enterobacterales NOT DETECTED NOT DETECTED Final   Enterobacter cloacae complex NOT DETECTED NOT DETECTED Final   Escherichia coli NOT DETECTED NOT DETECTED Final   Klebsiella aerogenes NOT DETECTED NOT DETECTED Final   Klebsiella oxytoca NOT DETECTED NOT DETECTED Final   Klebsiella pneumoniae NOT DETECTED NOT DETECTED Final   Proteus species NOT DETECTED NOT DETECTED Final   Salmonella species NOT DETECTED NOT DETECTED Final   Serratia marcescens NOT DETECTED NOT DETECTED Final   Haemophilus influenzae NOT DETECTED NOT DETECTED Final   Neisseria meningitidis NOT DETECTED NOT DETECTED Final   Pseudomonas aeruginosa NOT DETECTED NOT DETECTED Final   Stenotrophomonas maltophilia NOT DETECTED NOT DETECTED Final   Candida albicans NOT DETECTED NOT DETECTED Final   Candida auris NOT DETECTED NOT DETECTED Final   Candida glabrata NOT DETECTED NOT DETECTED Final   Candida krusei NOT DETECTED NOT DETECTED Final   Candida parapsilosis NOT DETECTED NOT DETECTED  Final   Candida tropicalis NOT DETECTED NOT DETECTED Final   Cryptococcus neoformans/gattii NOT DETECTED NOT DETECTED Final    Comment: Performed at [facility], [place] [address] [place], [place] [address]  Culture, blood (routine x 2)     Status: None (Preliminary result)   Collection Time: 12/30/20 10:49 AM   Specimen: BLOOD  Result Value Ref Range Status   Specimen Description   Final    BLOOD BLOOD RIGHT FOREARM Performed at [facility] [address], [place], [place] [address]    Special Requests   Final    BOTTLES DRAWN AEROBIC AND ANAEROBIC Blood Culture adequate volume Performed at [facility] [address], [place], [place] [address]    Culture   Final    NO GROWTH 3 DAYS Performed at [facility], [place] [address], [place], [place] [address]    Report Status PENDING  Incomplete  Urine Culture     Status: Abnormal   Collection Time: 12/30/20 12:31 PM   Specimen: Urine, Clean Catch  Result Value Ref Range Status   Specimen Description   Final    URINE, CLEAN CATCH Performed at [facility], [place] [address] [place], [place] [address]    Special Requests   Final    NONE Performed at [facility], [place] [address], [place], [place] [address]    Culture MULTIPLE SPECIES PRESENT, SUGGEST RECOLLECTION (A)  Final   Report Status 01/01/2021 FINAL  Final  Culture, blood (routine x 2)     Status: None (Preliminary result)   Collection Time: 12/31/20 12:39 PM   Specimen: Left Antecubital; Blood  Result Value Ref Range Status   Specimen Description   Final    LEFT ANTECUBITAL Performed at [facility] [address], [place], [place] [address]    Special Requests   Final    BOTTLES DRAWN AEROBIC AND ANAEROBIC Blood Culture adequate volume Performed at [facility] [address] [place], [place] [address]    Culture   Final    NO GROWTH 2  DAYS Performed at [facility] [address], [place], [place] [address]    Report Status PENDING  Incomplete  Culture, blood (routine x 2)     Status: None (Preliminary result)   Collection Time: 12/31/20 12:45 PM   Specimen: BLOOD LEFT HAND  Result Value Ref Range Status   Specimen Description   Final    BLOOD LEFT HAND Performed at [facility] [address] [place], [place] [address]    Special Requests   Final    BOTTLES DRAWN AEROBIC ONLY Blood Culture adequate volume Performed at [facility] [address] [place], [place] [address]    Culture   Final    NO GROWTH 2 DAYS Performed at [facility] [address] [place],  [address]    Report Status PENDING  Incomplete    Radiology Studies: No results found.  Scheduled Meds:  amLODipine  5 mg Oral Daily   carvedilol  6.25 mg Oral BID WC   Chlorhexidine Gluconate Cloth  6 each Topical Daily   enoxaparin (LOVENOX) injection  60 mg Subcutaneous Q24H   feeding supplement  237 mL Oral TID BM   gabapentin  300 mg Oral QHS   insulin aspart  0-9 Units Subcutaneous TID WC   lactulose  15 g Oral BID   multivitamin with minerals  1 tablet Oral Daily   oxyCODONE  40 mg Oral Q12H   pantoprazole  40 mg Oral BID   polyethylene glycol  17 g Oral Daily   senna-docusate  1 tablet Oral BID   sodium chloride flush  10-40 mL Intracatheter Q12H   Continuous Infusions:   LOS: 18 days  Time spent: 25 mins    Shawna Clamp, MD [facility]   If 7PM-7AM, please contact night-coverage

## 2021-01-03 NOTE — Progress Notes (Addendum)
HEMATOLOGY-ONCOLOGY PROGRESS NOTE  SUBJECTIVE: Reports adequate pain control but feels oversedated.  Denies nausea and vomiting.  Trying to ambulate more.  Afebrile.  PHYSICAL EXAMINATION:  Vitals:   01/02/21 2232 01/03/21 0427  BP: (!) 145/82 139/76  Pulse: 91 90  Resp: 18 16  Temp: 98.2 F (36.8 C) 98.2 F (36.8 C)  SpO2: 97% 95%   Filed Weights   12/16/20 1307 12/17/20 0841 12/22/20 0928  Weight: 127 kg 127 kg 127 kg    Intake/Output from previous day: 11/24 0701 - 11/25 0700 In: 540 [P.O.:540] Out: 800 [Urine:800] ABDOMEN: Soft, tender with mild firmness in the left mid and lower abdomen NEURO: alert & oriented x 3 with fluent speech, no focal motor/sensory deficits Vascular: No leg edema  Port-A-Cath without erythema  LABORATORY DATA:  I have reviewed the data as listed CMP Latest Ref Rng & Units 01/03/2021 01/02/2021 12/31/2020  Glucose 70 - 99 mg/dL 108(H) 123(H) 136(H)  BUN 6 - 20 mg/dL 17 22(H) 19  Creatinine 0.44 - 1.00 mg/dL 0.61 0.72 0.72  Sodium 135 - 145 mmol/L 138 135 137  Potassium 3.5 - 5.1 mmol/L 4.0 4.1 4.2  Chloride 98 - 111 mmol/L 107 104 104  CO2 22 - 32 mmol/L _0 Calcium 8.9 - 10.3 mg/dL 9.0 8.8(L) 8.9  Total Protein 6.5 - 8.1 g/dL - - 5.8(L)  Total Bilirubin 0.3 - 1.2 mg/dL - - 0.6  Alkaline Phos 38 - 126 U/L - - 738(H)  AST 15 - 41 U/L - - 55(H)  ALT 0 - 44 U/L - - 21    Lab Results  Component Value Date   WBC 6.1 01/02/2021   HGB 7.0 (L) 01/02/2021   HCT 22.7 (L) 01/02/2021   MCV 95.8 01/02/2021   PLT 150 01/02/2021   NEUTROABS 6.4 12/31/2020    DG Chest 2 View  Result Date: 12/16/2020 CLINICAL DATA:  Shortness of breath in a 56 year old female. EXAM: CHEST - 2 VIEW COMPARISON:  July 10, 2009. FINDINGS: Linear opacities in the LEFT chest of progressed slightly since the previous study. No lobar consolidation. Cardiomediastinal contours and hilar structures are stable. No sign of effusion. Mildly increased density in the  subcarinal region on lateral projection. No acute skeletal process on limited assessment. IMPRESSION: Signs of scarring or atelectasis in the LEFT chest, slightly progressed since the previous study. Question developing subcarinal adenopathy, based on lateral projection. Developing lower lobe airspace disease is also a differential consideration. In a patient with pelvic masses on recent pelvic MRI would consider follow-up chest CT for further evaluation. Electronically Signed   By: Zetta Bills M.D.   On: 12/16/2020 14:13   CT CHEST W CONTRAST  Result Date: 12/17/2020 CLINICAL DATA:  Cancer of unknown primary.  Staging. EXAM: CT CHEST WITH CONTRAST TECHNIQUE: Multidetector CT imaging of the chest was performed during intravenous contrast administration. CONTRAST:  12m OMNIPAQUE IOHEXOL 350 MG/ML SOLN COMPARISON:  Chest CT dated 06/01/2007. FINDINGS: Cardiovascular: There is no cardiomegaly or pericardial effusion. Three-vessel coronary vascular calcification. Mild atherosclerotic calcification of the thoracic aorta. No aneurysmal dilatation. The origins of the great vessels of the aortic arch appear patent as visualized. There is dilatation of the main pulmonary trunk suggestive of pulmonary hypertension. Evaluation of the pulmonary arteries is limited due to respiratory motion artifact and suboptimal opacification and timing of the contrast. No large or central pulmonary artery embolus identified. Mediastinum/Nodes: No hilar or mediastinal adenopathy. The esophagus is grossly unremarkable. Prior right hemithyroidectomy.  No mediastinal fluid collection. Lungs/Pleura: Bibasilar subpleural atelectasis/scarring. There is no pleural effusion pneumothorax. The central airways are patent. Upper Abdomen: Indeterminate 3 cm left renal nodule, present on the prior CT, likely a benign etiology such as adenoma. Musculoskeletal: Degenerative changes of the spine. No acute osseous pathology. IMPRESSION: 1. No acute  intrathoracic pathology. No CT evidence of central pulmonary artery embolus. 2. Dilatation of the main pulmonary trunk suggestive of pulmonary hypertension. 3. Status post prior right hemithyroidectomy. 4. Bilateral linear atelectasis/scarring. 5. Aortic Atherosclerosis (ICD10-I70.0). Electronically Signed   By: Anner Crete M.D.   On: 12/17/2020 21:09   MR Lumbar Spine W Wo Contrast  Result Date: 12/16/2020 CLINICAL DATA:  Initial evaluation for low back pain, cancer suspected. EXAM: MRI LUMBAR SPINE WITHOUT AND WITH CONTRAST TECHNIQUE: Multiplanar and multiecho pulse sequences of the lumbar spine were obtained without and with intravenous contrast. CONTRAST:  4m GADAVIST GADOBUTROL 1 MMOL/ML IV SOLN COMPARISON:  Prior CT from earlier the same day. FINDINGS: Segmentation: Standard. Lowest well-formed disc space labeled the L5-S1 level. Alignment: 4 mm facet mediated anterolisthesis of L4 on L5. Alignment otherwise normal with preservation of the normal lumbar lordosis. Vertebrae: Diffusely abnormal appearance of the bone marrow is seen throughout the visualized lumbar spine and pelvis. Associated heterogeneous STIR hyperintensity with irregular heterogeneous postcontrast enhancement. Findings are highly suspicious for diffuse osseous metastatic disease. Involvement appears to be most pronounced within the T12 vertebral body as well as the visualized pelvis. Exact measurements of a discrete lesion is difficult given the overall infiltrative appearance of this finding. No associated pathologic fracture. No visible extra osseous extension of tumor at this time. Conus medullaris and cauda equina: Conus extends to the L1 level. Conus and cauda equina appear normal. No visible epidural or intracanalicular tumor. Paraspinal and other soft tissues: Mild edema within the subcutaneous fat of the lower back, which could be related to overall volume status. Paraspinous soft tissues demonstrate no other acute finding.  Retroperitoneal/iliac adenopathy noted. Left larger than right adnexal masses partially visualized. Findings better characterized on prior CT. Disc levels: L1-2:  Unremarkable. L2-3: Disc desiccation with mild disc bulge. Superimposed small right foraminal to extraforaminal disc protrusion (series 11, image 17). Mild facet hypertrophy. Underlying short pedicles with a degree of mild spinal stenosis. Mild bilateral L2 foraminal narrowing. L3-4: Disc desiccation with mild disc bulge. Superimposed shallow left extraforaminal disc protrusion with annular fissure (series 11, image 24). Mild facet hypertrophy. Underlying short pedicles. Mild spinal stenosis. Foramina remain patent. L4-5: 4 mm anterolisthesis. Disc desiccation with broad-based posterior pseudo disc bulge. Biforaminal annular fissures noted, left larger than right. Moderate bilateral facet arthrosis. Resultant moderate spinal stenosis. Mild to moderate bilateral L4 foraminal narrowing. L5-S1: Normal interspace. Mild right greater than left facet hypertrophy. No stenosis. IMPRESSION: 1. 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 No associated pathologic fracture or extra osseous extension of tumor at this time. 2. Retroperitoneal/iliac adenopathy with left larger than right adnexal masses, better characterized on prior CT. 3. Underlying multilevel degenerative spondylosis as above, most pronounced at L4-5 where there is resultant moderate spinal stenosis. Electronically Signed   By: BJeannine BogaM.D.   On: 12/16/2020 23:44   MR PELVIS W WO CONTRAST  Result Date: 12/05/2020 CLINICAL DATA:  55year old female with indeterminate hypoechoic left adnexal mass identified in the setting of left lower quadrant pain. EXAM: MRI PELVIS WITHOUT AND WITH CONTRAST TECHNIQUE: Multiplanar multisequence MR  imaging of the pelvis was performed both  before and after administration of intravenous contrast. CONTRAST:  4m GADAVIST GADOBUTROL 1 MMOL/ML IV SOLN COMPARISON:  12/01/2020 pelvic sonogram and unenhanced CT abdomen/pelvis. FINDINGS: Urinary Tract: Mild diffuse bladder wall thickening. There is a poorly marginated enhancing solid 3.7 x 2.8 x 3.0 cm mass centered at the urethra (series 32/image 42 and series 30/image 67), extending from the vesicourethral junction throughout the length of the urethra. There appears to be an underlying right periurethral diverticulum (series 5/image 27). At the inferior tip of the urethra, there is a simple 0.9 x 0.9 x 0.9 cm cystic lesion compatible with a Skene's duct cyst. Bowel: Visualized small and large bowel are normal caliber with no bowel wall thickening. Vascular/Lymphatic: Mild bilateral inguinal lymphadenopathy measuring up to 1.4 cm short axis diameter on the right (series 19/image 71) and 1.4 cm on the left (series 19/image 70). Moderate bilateral external iliac lymphadenopathy measuring up to 1.7 cm bilaterally (series 19/image 53 on the right and 56 on the left). Bilateral common iliac lymphadenopathy, largest 1.6 cm on the right (series 19/image 27) and 1.7 cm on the left (series 19/image 14). Reproductive: Uterus: The anteverted uterus measures 8.2 x 3.7 x 5.5 cm. No uterine fibroids. Inner myometrium (junctional zone) measures 12 mm in thickness, which is compatible with mild diffuse uterine adenomyosis. Endometrium measures 4 mm in bilayer thickness, which is within normal limits. No endometrial cavity fluid or focal endometrial mass. Poorly marginated enhancing solid mass of the left uterine cervix measuring 2.4 x 2.3 x 3.0 cm (series 5/image 21 and series 8/image 22), which appears to disrupt the normal cervical fibrous stroma without frank parametrial invasion. Ovaries and Adnexa: The right ovary measures 4.0 x 2.4 x 4.3 cm and contains multiple solid avidly enhancing masses, largest 2.3 x 2.0 cm  (series 19/image 33). The left ovary measures 8.3 x 7.7 x 8.5 cm and is replaced by a predominantly solid avidly enhancing mass. Other: No abnormal free fluid in the pelvis. No focal pelvic fluid collection. Musculoskeletal: Diffuse patchy confluent nodular replacement of the pelvic osseous structures, best seen on axial series 7. IMPRESSION: 1. Poorly marginated enhancing 3.7 x 2.8 x 3.0 cm mass centered at the urethra, extending from the vesicourethral junction throughout the length of the female urethra, with probable underlying right periurethral diverticulum. This mass is concerning for malignancy. 2. Solid avidly enhancing bilateral ovarian masses, largest 2.3 cm on the right and 8.3 cm on the left, worrisome for bilateral ovarian metastases. 3. Poorly marginated enhancing 2.4 x 2.3 x 3.0 cm mass in the left uterine cervix, which appears to disrupt the normal cervical fibrous stroma without frank parametrial invasion, suspicious for malignancy. 4. Mild-to-moderate bilateral common iliac, bilateral external iliac and bilateral inguinal lymphadenopathy, suspicious for metastatic disease. 5. Diffuse patchy confluent nodular replacement of the pelvic osseous structures, suspicious for osseous metastatic disease. 6. Mild diffuse uterine adenomyosis. Electronically Signed   By: JIlona SorrelM.D.   On: 12/05/2020 20:31   [place]  Result Date: 12/16/2020 CLINICAL DATA:  Pelvic pain EXAM: TRANSABDOMINAL ULTRASOUND OF PELVIS DOPPLER ULTRASOUND OF OVARIES TECHNIQUE: Transabdominal ultrasound examination of the pelvis was performed including evaluation of the uterus, ovaries, adnexal regions, and pelvic cul-de-sac. Color and duplex Doppler ultrasound was utilized to evaluate blood flow to the ovaries. COMPARISON:  CT 12/16/2020, MRI 12/05/2020, pelvic ultrasound 12/01/2020 FINDINGS: Uterus Measurements: 6.8 x 3.4 x 5 cm = volume: 60.5 mL. No fibroids or other mass visualized.  Endometrium Poorly visible,  unable to measure. Right ovary Measurements: 4.8 x 2.9 x 5.5 cm = volume: 38.6 mL. Solid right adnexal mass replaces most of right ovary. Left ovary Measurements: 10.5 x 8.7 x 9.2 cm = volume: 440 mL. Enlarged left ovary with heterogenous solid mass that replaces the left ovary. Previously the left ovary measured 8.3 x 7.7 x 8.5 cm on MRI. Pulsed Doppler evaluation demonstrates normal arterial and venous waveforms in both or adnexa, though note that both ovaries are essentially replaced by mass lesions. Other: No significant ascites. Foley catheter within the bladder which appears somewhat thick walled. IMPRESSION: 1. Very limited exam secondary to habitus and only trans abdominal technique 2. Bilateral solid adnexal masses essentially replacing the ovaries which thereby limits assessment of the ovaries. Left adnexal solid mass lesion has significantly increased in size; documented flow in the adnexa relates to flow within the solid adnexal masses. Gynecology follow-up recommended. Electronically Signed   By: Donavan Foil M.D.   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 EXAM: CT ABDOMEN AND PELVIS WITH CONTRAST TECHNIQUE: Multidetector CT imaging of the abdomen and pelvis was performed using the standard protocol following bolus administration of intravenous contrast. CONTRAST:  68m OMNIPAQUE IOHEXOL 350 MG/ML SOLN COMPARISON:  Comparison is made with multiple prior studies which were performed recently, most recent comparison is an MRI of the pelvis of December 05, 2020. FINDINGS: Lower chest: Basilar atelectasis. Subcarinal region not well evaluated. No consolidation or effusion at the lung bases. Hepatobiliary: No focal, suspicious hepatic lesion. No pericholecystic stranding. Portal vein is patent. No biliary duct dilation. Pancreas: Pancreas with  normal contours. No signs of adjacent inflammation. Spleen: Spleen normal size and contour. Adrenals/Urinary Tract: Well-circumscribed LEFT adrenal lesion, density value of 19 Hounsfield units on previous noncontrast imaging measuring 3.2 x 2.6 cm., relative washout is indeterminate at 30%. Normal RIGHT adrenal. Normal, symmetric enhancement of bilateral kidneys without focal renal lesion. Stomach/Bowel: Small hiatal hernia. No stranding adjacent to the stomach. No sign of small bowel obstruction or acute small bowel process. The appendix is normal. Stool in various parts of the colon without signs of obstruction or adjacent stranding. Vascular/Lymphatic: Retroperitoneal adenopathy (image 50/2) 14 mm lymph node previously 11 mm along the LEFT common iliac chain. LEFT pelvic sidewall lymph node (image 75/2) 14 mm short axis, previously 13 mm. Similar size of RIGHT pelvic sidewall/external iliac lymph nodes also with little change compared to the most recent comparison imaging. Reproductive: Foley catheter in situ. Uterus and cervix not well at assess nor are the masses within the area of the cervix and urethra seen on previous MRI. Increasing size of LEFT ovary with heterogeneous appearance now measuring 10 x 8.8 cm. When assessed on the study of October 20, 2020 the ovary for reference measured 3.7 x 2.5 cm. RIGHT ovary with area of enhancement shows enlargement as well but not as pronounced as on previous imaging with 2.8 cm area of enhancement in the RIGHT ovary as the largest discrete area which is similar to the recent MRI evaluation. As compared to the study of October 20, 2020 there is little change in the appearance of the RIGHT ovary aside from this area though there is an increasingly nodular appearance of the ovary in this location. Mass posterior to the LEFT ovary just above the uterus measuring 4.4 x 2.8  cm has enlarged from 2.5 cm. Other: Small volume ascites. Musculoskeletal: Heterogeneous pattern  associated with the spine in terms of density raises the question of metastatic disease without discrete measurable focal lesion in the spine. For example a geographic area of sclerosis on image 101/5) in the anterior L3 vertebral body does raise the question of underlying lesion with numerous additional foci, essentially nearly all levels of the spine with some degree of heterogeneity. Another example of this finding is noted on the RIGHT at the T10 level. No acute bone finding or frankly destructive bone process. IMPRESSION: Rapid increase in size of the LEFT ovary with some adjacent ascites. The rapid enlargement is seen in the setting of generalized worsening of masses associated with the LEFT and RIGHT ovary but perhaps more pronounced than other areas in the pelvis. The possibility of torsion associated with an ovarian mass could be considered based on this finding. Would correlate with any worsening abdominal pain and with sonogram as warranted for further assessment. Enlarging masses elsewhere though not to the extent that is seen with the LEFT ovary. Masslike area of the cervix and potentially within a urethral diverticulum not as well seen as on the recent MRI evaluation. Small volume ascites. Well-circumscribed LEFT adrenal lesion measuring 3.2 x 2.6 cm, density value of 19 Hounsfield units on previous noncontrast imaging. Relative washout is indeterminate at 30%. This may represent an adrenal adenoma. Consider dedicated adrenal protocol CT or attention on PET evaluation if performed. Heterogeneous pattern of subtle sclerosis and lucency in the spine on today's study raising the question of bony metastatic disease in the spine. Findings of increased ovarian size on the LEFT out of proportion other areas were called by telephone at the time of interpretation on 12/16/2020 at 5:44 pm to provider Theodis Blaze , who verbally acknowledged these results. Electronically Signed   By: Zetta Bills M.D.   On:  12/16/2020 17:46   [place] Art/Ven Flow Abd Pelv Doppler  Result Date: 12/16/2020 CLINICAL DATA:  Pelvic pain EXAM: TRANSABDOMINAL ULTRASOUND OF PELVIS DOPPLER ULTRASOUND OF OVARIES TECHNIQUE: Transabdominal ultrasound examination of the pelvis was performed including evaluation of the uterus, ovaries, adnexal regions, and pelvic cul-de-sac. Color and duplex Doppler ultrasound was utilized to evaluate blood flow to the ovaries. COMPARISON:  CT 12/16/2020, MRI 12/05/2020, pelvic ultrasound 12/01/2020 FINDINGS: Uterus Measurements: 6.8 x 3.4 x 5 cm = volume: 60.5 mL. No fibroids or other mass visualized. Endometrium Poorly visible, unable to measure. Right ovary Measurements: 4.8 x 2.9 x 5.5 cm = volume: 38.6 mL. Solid right adnexal mass replaces most of right ovary. Left ovary Measurements: 10.5 x 8.7 x 9.2 cm = volume: 440 mL. Enlarged left ovary with heterogenous solid mass that replaces the left ovary. Previously the left ovary measured 8.3 x 7.7 x 8.5 cm on MRI. Pulsed Doppler evaluation demonstrates normal arterial and venous waveforms in both or adnexa, though note that both ovaries are essentially replaced by mass lesions. Other: No significant ascites. Foley catheter within the bladder which appears somewhat thick walled. IMPRESSION: 1. Very limited exam secondary to habitus and only trans abdominal technique 2. Bilateral solid adnexal masses essentially replacing the ovaries which thereby limits assessment of the ovaries. Left adnexal solid mass lesion has significantly increased in size; documented flow in the adnexa relates to flow within the solid adnexal masses. Gynecology follow-up recommended. Electronically Signed   By: Donavan Foil M.D.   On: 12/16/2020 19:23   DG CHEST PORT 1 VIEW  Result Date:  12/31/2020 CLINICAL DATA:  Chest pain EXAM: PORTABLE CHEST 1 VIEW COMPARISON:  Previous studies including the examination of 12/30/2020 FINDINGS: Transverse diameter of heart is increased. There are no  signs of pulmonary edema or focal pulmonary consolidation. There are small linear densities in both mid lung fields and left lower lung fields suggesting subsegmental atelectasis with interval improvement in the left lung and possible slight worsening in the right mid lung fields. IMPRESSION: Cardiomegaly. There are no signs of pulmonary edema or focal pulmonary consolidation. There are small linear densities in the both mid and left lower lung fields suggesting subsegmental atelectasis with some improvement in the left lung. Electronically Signed   By: Elmer Picker M.D.   On: 12/31/2020 13:39   DG CHEST PORT 1 VIEW  Result Date: 12/30/2020 CLINICAL DATA:  Fever, abdominal pain EXAM: PORTABLE CHEST 1 VIEW COMPARISON:  Previous studies including the CT chest done on 12/17/2020 FINDINGS: Transverse diameter of heart is increased. There are linear densities in both parahilar regions and medial left lower lung fields suggesting scarring or subsegmental atelectasis with interval worsening in the left lower lung fields. There is no significant pleural effusion or pneumothorax. Surgical clips are seen in the right thyroid bed. IMPRESSION: Cardiomegaly. There are linear densities in both parahilar regions and left lower lung fields with interval worsening suggesting subsegmental atelectasis. There are no signs of alveolar pulmonary edema or focal pulmonary consolidation. Electronically Signed   By: Elmer Picker M.D.   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 EXAM: IMPLANTED PORT A CATH PLACEMENT WITH ULTRASOUND AND FLUOROSCOPIC GUIDANCE COMPARISON:  None. MEDICATIONS: None. ANESTHESIA/SEDATION: Moderate (conscious) sedation was employed during this procedure. A total of Versed 3 mg and Fentanyl 100 mcg was  administered intravenously. Moderate Sedation Time: 19 minutes. The patient's level of consciousness and vital signs were monitored continuously by radiology nursing throughout the procedure under my direct supervision. CONTRAST:  None FLUOROSCOPY TIME:  0 minutes, 12 seconds (5 mGy) COMPLICATIONS: None immediate. PROCEDURE: The procedure, risks, benefits, and alternatives were explained to the patient. Questions regarding the procedure were encouraged and answered. The patient understands and consents to the procedure. The right neck and chest were prepped with chlorhexidine in a sterile fashion, and a sterile drape was applied covering the operative field. Maximum barrier sterile technique with sterile gowns and gloves were used for the procedure. A timeout was performed prior to the initiation of the procedure. Ultrasound was used to examine the jugular vein which was compressible and free of internal echoes. A skin marker was used to demarcate the planned venotomy and port pocket incision sites. Local anesthesia was provided to these sites and the subcutaneous tunnel track with 1% lidocaine with 1:100,000 epinephrine. A small incision was created at the jugular access site and blunt dissection was performed of the subcutaneous tissues. Under ultrasound guidance, the jugular vein was accessed with a 21 ga micropuncture needle and an 0.018" wire was inserted to the superior vena cava. Real-time ultrasound guidance was utilized for vascular access including the acquisition of a permanent ultrasound image documenting patency of the accessed vessel. A 5 Fr micopuncture set was then used, through which a 0.035" Rosen wire was passed under fluoroscopic guidance into the inferior vena cava. An 8 Fr dilator was then placed over the wire. A subcutaneous port pocket was then created along the  upper chest wall utilizing a combination of sharp and blunt dissection. The pocket was irrigated with sterile saline, packed with  gauze, and observed for hemorrhage. A single lumen "ISP" sized power injectable port was chosen for placement. The 8 Fr catheter was tunneled from the port pocket site to the venotomy incision. The port was placed in the pocket. The external catheter was trimmed to appropriate length. The dilator was exchanged for an 8 Fr peel-away sheath under fluoroscopic guidance. The catheter was then placed through the sheath and the sheath was removed. Final catheter positioning was confirmed and documented with a fluoroscopic spot radiograph. The port was accessed with a Huber needle, aspirated, and flushed with heparinized saline. The deep dermal layer of the port pocket incision was closed with interrupted 3-0 Vicryl suture. The skin was opposed with a running subcuticular 4-0 Monocryl suture. Dermabond was then placed over the port pocket and neck incisions. The patient tolerated the procedure well without immediate post procedural complication. FINDINGS: After catheter placement, the tip lies within the superior cavoatrial junction. The catheter aspirates and flushes normally and is ready for immediate use. IMPRESSION: Successful placement of a power injectable Port-A-Cath via the right internal jugular vein. The catheter is ready for immediate use. Ruthann Cancer, MD [facility] Electronically Signed   By: Ruthann Cancer M.D.   On: 12/26/2020 11:08   VAS [place] LOWER EXTREMITY VENOUS (DVT)  Result Date: 12/29/2020  Lower Venous DVT Study Patient Name:  DARALYN BERT Mavis  Date of Exam:   12/29/2020 Medical Rec #: [id number]            Accession #:    [id number] Date of Birth: 27-Sep-1965            Patient Gender: F Patient Age:   26 years Exam Location:  [facility] Procedure:      VAS [place] LOWER EXTREMITY VENOUS (DVT) Referring Phys: A POWELL JR --------------------------------------------------------------------------------  Indications: Pain.  Risk  Factors: Cancer. Limitations: Body habitus and poor ultrasound/tissue interface. Comparison Study: No prior studies. Performing Technologist: Oliver Hum RVT  Examination Guidelines: A complete evaluation includes B-mode imaging, spectral Doppler, color Doppler, and power Doppler as needed of all accessible portions of each vessel. Bilateral testing is considered an integral part of a complete examination. Limited examinations for reoccurring indications may be performed as noted. The reflux portion of the exam is performed with the patient in reverse Trendelenburg.  +---------+---------------+---------+-----------+----------+--------------+ RIGHT    CompressibilityPhasicitySpontaneityPropertiesThrombus Aging +---------+---------------+---------+-----------+----------+--------------+ CFV      Full           Yes      Yes                                 +---------+---------------+---------+-----------+----------+--------------+ SFJ      Full                                                        +---------+---------------+---------+-----------+----------+--------------+ FV Prox  Full                                                        +---------+---------------+---------+-----------+----------+--------------+  FV Mid   Full                                                        +---------+---------------+---------+-----------+----------+--------------+ FV DistalFull                                                        +---------+---------------+---------+-----------+----------+--------------+ PFV      Full                                                        +---------+---------------+---------+-----------+----------+--------------+ POP      Full           Yes      Yes                                 +---------+---------------+---------+-----------+----------+--------------+ PTV      Full                                                         +---------+---------------+---------+-----------+----------+--------------+ PERO     Full                                                        +---------+---------------+---------+-----------+----------+--------------+   +---------+---------------+---------+-----------+----------+--------------+ LEFT     CompressibilityPhasicitySpontaneityPropertiesThrombus Aging +---------+---------------+---------+-----------+----------+--------------+ CFV      Full           Yes      Yes                                 +---------+---------------+---------+-----------+----------+--------------+ SFJ      Full                                                        +---------+---------------+---------+-----------+----------+--------------+ FV Prox  Full                                                        +---------+---------------+---------+-----------+----------+--------------+ FV Mid   Full                                                        +---------+---------------+---------+-----------+----------+--------------+  FV DistalFull                                                        +---------+---------------+---------+-----------+----------+--------------+ PFV      Full                                                        +---------+---------------+---------+-----------+----------+--------------+ POP      Full           Yes      Yes                                 +---------+---------------+---------+-----------+----------+--------------+ PTV      Full                                                        +---------+---------------+---------+-----------+----------+--------------+ PERO     Full                                                        +---------+---------------+---------+-----------+----------+--------------+     Summary: RIGHT: - There is no evidence of deep vein thrombosis in the lower extremity.  - No cystic structure found in  the popliteal fossa.  LEFT: - There is no evidence of deep vein thrombosis in the lower extremity.  - No cystic structure found in the popliteal fossa.  *See table(s) above for measurements and observations. Electronically signed by Jamelle Haring on 12/29/2020 at 12:00:16 PM.    Final     ASSESSMENT AND PLAN: 1.  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 -12/05/2020 MRI of the pelvis-poorly marginated enhancing 3.7 x 2.8 x 3.0 cm mass centered at the urethra, solid avidly enhancing bilateral ovarian masses, poorly marginated enhancing 2.4 x 2.3 x 3.0 cm mass in the left uterine cervix, mild to moderate bilateral common iliac, bilateral external iliac, and bilateral inguinal lymphadenopathy, diffuse patchy confluent nodular replacement of the pelvic osseous structures. -12/13/2020 CEA 1864, CA125 29.2 -12/16/2020 CT abdomen/pelvis-rapidly increasing size of the left ovary with some adjacent ascites, enlarging masses elsewhere, masslike area of the cervix and potentially within a urethral diverticulum, well-circumscribed left adrenal lesion measuring 3.2 x 2.6 cm, heterogeneous pattern of subtle sclerosis and lucency in the spine. -12/16/2020 MRI of the lumbar spine-diffusely abnormal appearance of the bone marrow throughout the visualized lumbar spine and pelvis highly suspicious for diffuse osseous metastatic disease, retroperitoneal/iliac adenopathy with left larger than right adnexal masses. 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  Abdominal pain secondary #1 3.  Constipation 4.   Normocytic anemia 5.  Leukocytosis 6.  Protein calorie malnutrition 7.  Obstructive sleep apnea 8.  Diabetes mellitus 9.  Hypertension 10.  Possible pulmonary hypertension noted on CT chest  Ms. Gaines appears unchanged.  Her pain is adequately controlled, but she is more sedated than she would like.  Recommend continuation of OxyContin at the current dose but reducing oxycodone from 15 mg down to 5 to 10 mg every 4 hours as needed.  She remains afebrile.  Blood cultures drawn on 11/22 remain negative to date.  She can be transferred to the inpatient rehabilitation service versus outpatient rehab in a SNF.  Next cycle of chemo is due 12/2.  If she goes to inpatient rehab, may will need to delay.  Recommendations: 1.  Continue OxyContin 40 mg twice every 12 hours and decrease oxycodone from 15 mg every 4 hours as needed to 5 to 10 mg every 4 hours as needed. 2.  Plan for outpatient PET scan  3.  We will follow-up on molecular testing, HER2, and PD-L1 results. 4.  No need for daily CBC 5.  Incentive spirometer ordered. 6.  Okay to transfer to rehab when bed available.  We will arrange for outpatient follow-up at the cancer center for her next cycle of chemotherapy.  Please call medical oncology over the weekend for questions.  We will check on her again 11/28 if she remains in the hospital.  Future Appointments  Date Time Provider [place]  02/18/2021 10:45 AM [name], PA-C [facility]      LOS: 18 days   [name] 01/03/21 Ms. Wemhoff was interviewed and examined.  She reports somnolence when taking oxycodone for breakthrough pain.  We adjusted the oxycodone dose today. She is now at day 8 following cycle 1 FOLFOX.  We will plan cycle 2 FOLFOX to be given 01/10/2022 or week of 01/13/2022.  The positive blood culture from 12/30/2020 was likely contaminant.  She has been afebrile for the past 2 days.  I will check on her 01/06/2021.

## 2021-01-04 LAB — CULTURE, BLOOD (ROUTINE X 2)
Culture: NO GROWTH
Special Requests: ADEQUATE

## 2021-01-04 LAB — CBC
HCT: 22.8 % — ABNORMAL LOW (ref 36.0–46.0)
Hemoglobin: 7.1 g/dL — ABNORMAL LOW (ref 12.0–15.0)
MCH: 29.7 pg (ref 26.0–34.0)
MCHC: 31.1 g/dL (ref 30.0–36.0)
MCV: 95.4 fL (ref 80.0–100.0)
Platelets: 147 10*3/uL — ABNORMAL LOW (ref 150–400)
RBC: 2.39 MIL/uL — ABNORMAL LOW (ref 3.87–5.11)
RDW: 14.6 % (ref 11.5–15.5)
WBC: 5.8 10*3/uL (ref 4.0–10.5)
nRBC: 3.4 % — ABNORMAL HIGH (ref 0.0–0.2)

## 2021-01-04 LAB — BASIC METABOLIC PANEL
Anion gap: 8 (ref 5–15)
BUN: 14 mg/dL (ref 6–20)
CO2: 25 mmol/L (ref 22–32)
Calcium: 9.1 mg/dL (ref 8.9–10.3)
Chloride: 104 mmol/L (ref 98–111)
Creatinine, Ser: 0.68 mg/dL (ref 0.44–1.00)
GFR, Estimated: >60 mL/min (ref >60)
Glucose, Bld: 122 mg/dL — ABNORMAL HIGH (ref 70–99)
Potassium: 3.8 mmol/L (ref 3.5–5.1)
Sodium: 137 mmol/L (ref 135–145)

## 2021-01-04 LAB — GLUCOSE, CAPILLARY
Glucose-Capillary: 131 mg/dL — ABNORMAL HIGH (ref 70–99)
Glucose-Capillary: 148 mg/dL — ABNORMAL HIGH (ref 70–99)
Glucose-Capillary: 129 mg/dL — ABNORMAL HIGH (ref 70–99)
Glucose-Capillary: 152 mg/dL — ABNORMAL HIGH (ref 70–99)

## 2021-01-04 NOTE — Progress Notes (Signed)
PROGRESS NOTE    Erin [name]  ELF:[id number] DOB: [birth year] DOA: 12/16/2020 PCP: Pcp, No   Brief Narrative:  This 55 years old female with PMH significant for OSA on CPAP, HTN, DMII, morbid obesity who presented with worsening abdominal pain and left thigh pain.  She has been undergoing workup for recently discovered cervical/urethral masses on CT in Sept 2022.  Further imaging showed ongoing interval enlargement of the left adnexal area.  There was also associated retroperitoneal and pelvic adenopathy. MRI pelvis also performed on 12/05/2020 showed poorly enhancing mass centered at the urethra, solid enhancing bilateral ovarian masses, poorly enhancing mass involving left uterine cervix, and significant adenopathy involving bilateral common iliac, bilateral external iliac, bilateral inguinal lymph nodes.  Also noted to have pelvic osseous lesions concerning for metastatic disease as well. Patient underwent cystoscopy with ureteral and bladder biopsy on 12/17/2020 she also underwent cervical biopsies following this procedure.  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 PT recommended acute CIR.  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 MRI showed poorly marginated enhancing 3.7 x 2.8 x 3.0 cm mass centered at urethra concerning for malignancy -- solid avidly enhancing bilateral ovarian masses, largest 2.3 cm on Rt and 8.3 cm on  Lt concerning for bilateral ovarian mets -- poorly marginated enhancing 2.4x2.3x3.0 cm mass in L uterine cervix, concerning for malignancy -- bilateral common iliac, bilateral external iliac, and bilateral inguinal LAD, concerning for metastatic disease -- diffuse patchy confluent nodular replacement of the pelvic osseus structures, concerning for osseus metastatic disease (see report) - Biopsy taken during cystoscopy on 12/17/2020 - biopsy has returned on 11/11. 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  gastric nodule bx and notable for adenocarcinoma, poorly differentiated with signet ring morphology.  Focal invastion of muscularis mucosa.  Muscularis propia not present in bx.  Per path reports, this most likely represents primary source of metastatic carcinoma dx on bx of vagina on 11/8. - Oncology following, now s/p port placement 11/17 - Appreciate gyn onc assistance    Suspected metastatic gastric cancer : Oncology awaiting molecular testing to determine treatment options. Cycle 1 folfox started ON 11/18 per oncology, had planned to discharge to [facility]  on[facility] after chemo complete    Fever: > Resolved. Patient has spiked fever could be related to malignancy. Hold abx for now, consider starting if recurrent or spike > 101 F 1 set of cultures from 11/21 with gram positive cocci with staph species (suspect contaminant) Repeat blood cultures NGTD Urine culture contaminated. Repeat CXR with subsegmental atelectasis.  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  -  gastric  nodule bx and notable for adenocarcinoma, poorly differentiated with signet ring morphology.  Focal invastion of muscularis mucosa.  Muscularis propia not present in bx.  Per path reports, this most likely represents primary source of metastatic carcinoma dx on bx of vagina on 11/8. - Onco recommended Outpatient PET study  as outpatient   Normochromic normocytic anemia: Could be secondary to malignancy. Continue to monitor H&H. Transfuse 1 unit of PRBC.  Posttransfusion hemoglobin 7.6>7.0>7.1   Chronic pain syndrome: Continue long-acting oxycontin 40 mg twice daily. Reduce short acting oxycodone to 10 mg every 6 as needed Palliative care consulted for pain management. LE [place] for LE pain negative.  Trial flexeril.  Increase gabapentin.   Panic attacks: Continue benzo as needed  Gastritis: EGD with gastritis PPI x 6 weeks then 40 mg daily Colonoscopy with 5 polyps removed (1 in ascending and 4 in sigmoid) bx as noted above   Ovarian mass: Noted on MRI pelvis Workup as noted above.     Acute lower UTI-resolved as of 12/24/2020 S/p ceftriaxone Culture with strep viridans resolved      Malnutrition of moderate degree - Moderate malnutrition in context of acute illness/injury, morbid obesity - Evaluated by dietitian, appreciate assistance - per RD: "Moderate Malnutrition related to acute illness as evidenced by percent weight loss, moderate muscle depletion, energy intake < 75% for > 7 days."    OSA (obstructive sleep apnea) Continue CPAP.   Diabetes mellitus ([place]) Continue SSI   Essential hypertension Continue amlodipine, coreg    DVT prophylaxis: Lovenox Code Status: Full code Family Communication: No family at bedside. Disposition Plan:   Status is: Inpatient  Remains inpatient appropriate because: Patient is medically clear.  Awaiting CIR.  Patient is medically clear for discharge : Yes  Consultants:  Oncology IR Urology GI Palliative care D1  oncology  Procedures:  Diagnostic cystoscopy, bladder biopsy, urethral biopsy, 12/17/20 Cervical biopsies, 12/17/2020 EGD and colonoscopy 12/22/20 Port placement by IR 11/17   Antimicrobials:  Anti-infectives (From admission, onward)    Start     Dose/Rate Route Frequency Ordered Stop   12/23/20 1000  cefTRIAXone (ROCEPHIN) 1 g in sodium chloride 0.9 % 100 mL IVPB        1 g 200 mL/hr over 30 Minutes Intravenous Every 24 hours 12/23/20 0746 12/24/20 1030   12/20/20 1000  cefTRIAXone (ROCEPHIN) 1 g in sodium chloride 0.9 % 100 mL IVPB        1 g 200 mL/hr over 30 Minutes Intravenous Every 24 hours 12/20/20 0752 12/22/20 1451   12/17/20 0930  ceFAZolin (ANCEF) 3 g in dextrose 5 % 50 mL IVPB  Status:  Discontinued        3 g 100 mL/hr over 30 Minutes Intravenous  Once 12/17/20 0925 12/17/20 0926   12/17/20 0915  ceFAZolin (ANCEF) IVPB 3g/100 mL premix        3 g 200 mL/hr over 30 Minutes Intravenous  Once 12/17/20 0914 12/17/20 0935        Subjective: Patient was seen and examined at bedside.  Overnight events noted.   Patient reports feeling much improved.  She was sitting on the side of the bed having breakfast..  She is awaiting bed placement in CIR  Objective: Vitals:   01/03/21 1355 01/03/21 2213 01/04/21 0529 01/04/21 1326  BP: 122/79 (!) 142/79 (!) 151/78 (!) 146/78  Pulse: 93 92 92 87  Resp: 17 18 19    Temp: 97.9 F (36.6 C) (!) 97.5 F (36.4  C) 97.8 F (36.6 C) 98 F (36.7 C)  TempSrc: Oral Oral Oral Oral  SpO2: 93% 93% 92% 97%  Weight:      Height:        Intake/Output Summary (Last 24 hours) at 01/04/2021 1335 Last data filed at 01/04/2021 8144 Gross per 24 hour  Intake 240 ml  Output 850 ml  Net -610 ml   Filed Weights   12/16/20 1307 12/17/20 0841 12/22/20 0928  Weight: 127 kg 127 kg 127 kg    Examination:  General exam: Comfortable. Not in any distress, deconditioned. Respiratory system: Clear to auscultation B/L. Respiratory effort normal.  RR 17 Cardiovascular system: S1-S2 heard, regular rate and rhythm, no murmur. Gastrointestinal system: Abdomen is soft, mildly distended, nontender, BS +. Central nervous system: Alert and oriented x 3 . No focal neurological deficits. Extremities: Edema 1+, no cyanosis, no clubbing. Skin: No rashes, lesions or ulcers Psychiatry: Judgement and insight appear normal. Mood & affect appropriate.     Data Reviewed: I have personally reviewed following labs and imaging studies  CBC: Recent Labs  Lab 12/29/20 0541 12/30/20 1044 12/31/20 0521 01/01/21 0830 01/02/21 0550 01/04/21 0500  WBC 10.8* 9.0 7.8 6.8 6.1 5.8  NEUTROABS 8.8* 7.7 6.4  --   --   --   HGB 7.6* 7.9* 6.6* 7.6* 7.0* 7.1*  HCT 24.3* 25.3* 20.5* 24.5* 22.7* 22.8*  MCV 95.3 95.1 94.9 94.2 95.8 95.4  PLT 228 210 155 177 150 818*   Basic Metabolic Panel: Recent Labs  Lab 12/29/20 0541 12/30/20 1044 12/31/20 0521 01/02/21 0550 01/03/21 0620 01/04/21 0500  NA 135 137 137 135 138 137  K 4.1 4.3 4.2 4.1 4.0 3.8  CL 103 104 104 104 107 104  CO2 24 26 24 24 25 25   GLUCOSE 148* 125* 136* 123* 108* 122*  BUN 24* 18 19 22* 17 14  CREATININE 0.73 0.68 0.72 0.72 0.61 0.68  CALCIUM 9.2 9.3 8.9 8.8* 9.0 9.1  MG 2.3  --  2.3  --   --   --   PHOS 3.3  --  3.2  --   --   --    GFR: Estimated Creatinine Clearance: 106.1 mL/min (by C-G formula based on SCr of 0.68 mg/dL). Liver Function Tests: Recent Labs  Lab 12/29/20 0541 12/30/20 1044 12/31/20 0521  AST 70* 82* 55*  ALT 36 27 21  ALKPHOS 712* 933* 738*  BILITOT 0.5 0.7 0.6  PROT 6.1* 6.6 5.8*  ALBUMIN 2.1* 2.1* 1.9*   No results for input(s): LIPASE, AMYLASE in the last 168 hours. No results for input(s): AMMONIA in the last 168 hours. Coagulation Profile: No results for input(s): INR, PROTIME in the last 168 hours.  Cardiac Enzymes: No results for input(s): CKTOTAL, CKMB, CKMBINDEX, TROPONINI in the last 168 hours. BNP (last 3 results) No results for  input(s): PROBNP in the last 8760 hours. HbA1C: No results for input(s): HGBA1C in the last 72 hours. CBG: Recent Labs  Lab 01/03/21 1142 01/03/21 1611 01/03/21 2215 01/04/21 0740 01/04/21 1222  GLUCAP 170* 136* 116* 131* 148*   Lipid Profile: No results for input(s): CHOL, HDL, LDLCALC, TRIG, CHOLHDL, LDLDIRECT in the last 72 hours. Thyroid Function Tests: No results for input(s): TSH, T4TOTAL, FREET4, T3FREE, THYROIDAB in the last 72 hours. Anemia Panel: No results for input(s): VITAMINB12, FOLATE, FERRITIN, TIBC, IRON, RETICCTPCT in the last 72 hours. Sepsis Labs: No results for input(s): PROCALCITON, LATICACIDVEN in the last 168 hours.  Recent Results (  from the past 240 hour(s))  Culture, blood (routine x 2)     Status: Abnormal   Collection Time: 12/30/20 10:44 AM   Specimen: BLOOD  Result Value Ref Range Status   Specimen Description   Final    BLOOD LEFT ANTECUBITAL Performed at [facility] [address], [place], [place] [address]    Special Requests   Final    BOTTLES DRAWN AEROBIC AND ANAEROBIC Blood Culture adequate volume Performed at [facility] [address], [place], [place] [address]    Culture  Setup Time   Final    GRAM POSITIVE COCCI AEROBIC BOTTLE ONLY CRITICAL RESULT CALLED TO, READ BACK BY AND VERIFIED WITH: PHARMD J GADHIA [id number] AT 76 AM BY CM    Culture (A)  Final    STAPHYLOCOCCUS HOMINIS THE SIGNIFICANCE OF ISOLATING THIS ORGANISM FROM A SINGLE SET OF BLOOD CULTURES WHEN MULTIPLE SETS ARE DRAWN IS UNCERTAIN. PLEASE NOTIFY THE MICROBIOLOGY DEPARTMENT WITHIN ONE WEEK IF SPECIATION AND SENSITIVITIES ARE REQUIRED. Performed at [facility], [place] [address], [place], [place] [address]    Report Status 01/01/2021 FINAL  Final  Blood Culture ID Panel (Reflexed)     Status: Abnormal   Collection Time: 12/30/20 10:44 AM  Result Value Ref Range Status   Enterococcus faecalis NOT DETECTED NOT DETECTED  Final   Enterococcus Faecium NOT DETECTED NOT DETECTED Final   Listeria monocytogenes NOT DETECTED NOT DETECTED Final   Staphylococcus species DETECTED (A) NOT DETECTED Final    Comment: CRITICAL RESULT CALLED TO, READ BACK BY AND VERIFIED WITH: PHARMD J GADHIA [id number] AT 844 AM BY CM    Staphylococcus aureus (BCID) NOT DETECTED NOT DETECTED Final   Staphylococcus epidermidis NOT DETECTED NOT DETECTED Final   Staphylococcus lugdunensis NOT DETECTED NOT DETECTED Final   Streptococcus species NOT DETECTED NOT DETECTED Final   Streptococcus agalactiae NOT DETECTED NOT DETECTED Final   Streptococcus pneumoniae NOT DETECTED NOT DETECTED Final   Streptococcus pyogenes NOT DETECTED NOT DETECTED Final   A.calcoaceticus-baumannii NOT DETECTED NOT DETECTED Final   Bacteroides fragilis NOT DETECTED NOT DETECTED Final   Enterobacterales NOT DETECTED NOT DETECTED Final   Enterobacter cloacae complex NOT DETECTED NOT DETECTED Final   Escherichia coli NOT DETECTED NOT DETECTED Final   Klebsiella aerogenes NOT DETECTED NOT DETECTED Final   Klebsiella oxytoca NOT DETECTED NOT DETECTED Final   Klebsiella pneumoniae NOT DETECTED NOT DETECTED Final   Proteus species NOT DETECTED NOT DETECTED Final   Salmonella species NOT DETECTED NOT DETECTED Final   Serratia marcescens NOT DETECTED NOT DETECTED Final   Haemophilus influenzae NOT DETECTED NOT DETECTED Final   Neisseria meningitidis NOT DETECTED NOT DETECTED Final   Pseudomonas aeruginosa NOT DETECTED NOT DETECTED Final   Stenotrophomonas maltophilia NOT DETECTED NOT DETECTED Final   Candida albicans NOT DETECTED NOT DETECTED Final   Candida auris NOT DETECTED NOT DETECTED Final   Candida glabrata NOT DETECTED NOT DETECTED Final   Candida krusei NOT DETECTED NOT DETECTED Final   Candida parapsilosis NOT DETECTED NOT DETECTED Final   Candida tropicalis NOT DETECTED NOT DETECTED Final   Cryptococcus neoformans/gattii NOT DETECTED NOT DETECTED Final     Comment: Performed at [facility], [place] [address], [place], [place] [address]  Culture, blood (routine x 2)     Status: None   Collection Time: 12/30/20 10:49 AM   Specimen: BLOOD  Result Value Ref Range Status   Specimen Description   Final  BLOOD BLOOD RIGHT FOREARM Performed at [facility] [address] [place], [place] [address]    Special Requests   Final    BOTTLES DRAWN AEROBIC AND ANAEROBIC Blood Culture adequate volume Performed at [facility] [address], [place], [place] [address]    Culture   Final    NO GROWTH 5 DAYS Performed at [facility], [place] [address] [place], [place] [address]    Report Status 01/04/2021 FINAL  Final  Urine Culture     Status: Abnormal   Collection Time: 12/30/20 12:31 PM   Specimen: Urine, Clean Catch  Result Value Ref Range Status   Specimen Description   Final    URINE, CLEAN CATCH Performed at [facility], [place] [address], [place], [place] [address]    Special Requests   Final    NONE Performed at [facility], [place] [address] [place], [place] [address]    Culture MULTIPLE SPECIES PRESENT, SUGGEST RECOLLECTION (A)  Final   Report Status 01/01/2021 FINAL  Final  Culture, blood (routine x 2)     Status: None (Preliminary result)   Collection Time: 12/31/20 12:39 PM   Specimen: Left Antecubital; Blood  Result Value Ref Range Status   Specimen Description   Final    LEFT ANTECUBITAL Performed at [facility] [address], [place], [place] [address]    Special Requests   Final    BOTTLES DRAWN AEROBIC AND ANAEROBIC Blood Culture adequate volume Performed at [facility] [address] [place], [place] [address]    Culture   Final    NO GROWTH 4 DAYS Performed at [facility], [place] [address], [place], [place] [address]    Report Status PENDING  Incomplete  Culture, blood (routine x 2)      Status: None (Preliminary result)   Collection Time: 12/31/20 12:45 PM   Specimen: BLOOD LEFT HAND  Result Value Ref Range Status   Specimen Description   Final    BLOOD LEFT HAND Performed at [facility] [address], [place], [place] [address]    Special Requests   Final    BOTTLES DRAWN AEROBIC ONLY Blood Culture adequate volume Performed at [facility] [address], [place], [place] [address]    Culture   Final    NO GROWTH 4 DAYS Performed at [facility], [place] [address], [place],  [address]    Report Status PENDING  Incomplete    Radiology Studies: No results found.  Scheduled Meds:  amLODipine  5 mg Oral Daily   carvedilol  6.25 mg Oral BID WC   Chlorhexidine Gluconate Cloth  6 each Topical Daily   enoxaparin (LOVENOX) injection  60 mg Subcutaneous Q24H   feeding supplement  237 mL Oral TID BM   gabapentin  300 mg Oral QHS   insulin aspart  0-9 Units Subcutaneous TID WC   lactulose  15 g Oral BID   multivitamin with minerals  1 tablet Oral Daily   oxyCODONE  40 mg Oral Q12H   pantoprazole  40 mg Oral BID   polyethylene glycol  17 g Oral Daily   senna-docusate  1 tablet Oral BID   sodium chloride flush  10-40 mL Intracatheter Q12H   Continuous Infusions:   LOS: 19 days    Time spent: 25 mins    Shawna Clamp, MD [facility]  If 7PM-7AM, please contact night-coverage

## 2021-01-04 NOTE — Progress Notes (Signed)
Mobility Specialist - Progress Note    01/04/21 1422  Mobility  Activity Ambulated in hall  Level of Assistance Minimal assist, patient does 75% or more  Assistive Device Front wheel walker  Distance Ambulated (ft) 50 ft  Mobility Ambulated with assistance in hallway  Mobility Response Tolerated well  Mobility performed by Mobility specialist  $Mobility charge 1 Mobility   Upon entry pt was agreeable to mobilize and required Min A to stand from recliner. RW used to help pt ambulate 50 ft in hallway. Pt c/o of LE and stomach pain, but was determined to walk. 1 standing rest break required. After session, pt returned to recliner with lunch on tray table and call bell at side.   [place] Specialist Acute Rehabilitation Services Phone: [phone] 01/04/21, 2:25 PM

## 2021-01-05 LAB — GLUCOSE, CAPILLARY
Glucose-Capillary: 123 mg/dL — ABNORMAL HIGH (ref 70–99)
Glucose-Capillary: 113 mg/dL — ABNORMAL HIGH (ref 70–99)
Glucose-Capillary: 105 mg/dL — ABNORMAL HIGH (ref 70–99)
Glucose-Capillary: 119 mg/dL — ABNORMAL HIGH (ref 70–99)

## 2021-01-05 LAB — CULTURE, BLOOD (ROUTINE X 2)
Culture: NO GROWTH
Culture: NO GROWTH
Special Requests: ADEQUATE
Special Requests: ADEQUATE

## 2021-01-05 LAB — CBC
HCT: 23.3 % — ABNORMAL LOW (ref 36.0–46.0)
Hemoglobin: 7.4 g/dL — ABNORMAL LOW (ref 12.0–15.0)
MCH: 30.2 pg (ref 26.0–34.0)
MCHC: 31.8 g/dL (ref 30.0–36.0)
MCV: 95.1 fL (ref 80.0–100.0)
Platelets: 152 10*3/uL (ref 150–400)
RBC: 2.45 MIL/uL — ABNORMAL LOW (ref 3.87–5.11)
RDW: 14.8 % (ref 11.5–15.5)
WBC: 6.4 10*3/uL (ref 4.0–10.5)
nRBC: 4.7 % — ABNORMAL HIGH (ref 0.0–0.2)

## 2021-01-05 NOTE — Progress Notes (Signed)
PROGRESS NOTE    Erin [name]  CHE:[id number] DOB: 01-05-1966 DOA: 12/16/2020 PCP: Pcp, No   Brief Narrative:  This 55 years old female with PMH significant for OSA on CPAP, HTN, DMII, morbid obesity who presented with worsening abdominal pain and left thigh pain.  She has been undergoing workup for recently discovered cervical/urethral masses on CT in Sept 2022.  Further imaging showed ongoing interval enlargement of the left adnexal area.  There was also associated retroperitoneal and pelvic adenopathy. MRI pelvis also performed on 12/05/2020 showed poorly enhancing mass centered at the urethra, solid enhancing bilateral ovarian masses, poorly enhancing mass involving left uterine cervix, and significant adenopathy involving bilateral common iliac, bilateral external iliac, bilateral inguinal lymph nodes.  Also noted to have pelvic osseous lesions concerning for metastatic disease as well. Patient underwent cystoscopy with ureteral and bladder biopsy on 12/17/2020 she also underwent cervical biopsies following this procedure.  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 PT recommended acute CIR.  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 MRI showed poorly marginated enhancing 3.7 x 2.8 x 3.0 cm mass centered at urethra concerning for malignancy -- solid avidly enhancing bilateral ovarian masses, largest 2.3 cm on Rt and 8.3 cm on  Lt concerning for bilateral ovarian mets -- poorly marginated enhancing 2.4x2.3x3.0 cm mass in L uterine cervix, concerning for malignancy -- bilateral common iliac, bilateral external iliac, and bilateral inguinal LAD, concerning for metastatic disease -- diffuse patchy confluent nodular replacement of the pelvic osseus structures, concerning for osseus metastatic disease (see report) - Biopsy taken during cystoscopy on 12/17/2020 - biopsy has returned on 11/11. 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  gastric nodule bx and notable for adenocarcinoma, poorly differentiated with signet ring morphology.  Focal invastion of muscularis mucosa.  Muscularis propia not present in bx.  Per path reports, this most likely represents primary source of metastatic carcinoma dx on bx of vagina on 11/8. - Oncology following, now s/p port placement 11/17 - Appreciate gyn onc assistance    Suspected metastatic gastric cancer : Oncology awaiting molecular testing to determine treatment options. Cycle 1 folfox started ON 11/18 per oncology, [facility] after chemo complete   Fever: > Resolved. Patient has spiked fever could be related to malignancy. Hold abx for now, consider starting if recurrent or spike > 101 F 1 set of cultures from 11/21 with gram positive cocci with staph species (suspect contaminant) Repeat blood cultures NGTD Urine culture contaminated. Repeat CXR with subsegmental atelectasis.  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  - gastric  nodule bx and notable for adenocarcinoma, poorly differentiated with signet ring morphology.  Focal invastion of muscularis mucosa.  Muscularis propia not present in bx.  Per path reports, this most likely represents primary source of metastatic carcinoma dx on bx of vagina on 11/8. - Onco recommended Outpatient PET study  as outpatient   Normochromic normocytic anemia: Could be secondary to malignancy. Continue to monitor H&H. Transfuse 1 unit of PRBC.  Posttransfusion hemoglobin 7.6>7.0>7.1>7.4   Chronic pain syndrome: Continue long-acting oxycontin 40 mg twice daily. Reduce short acting oxycodone to 10 mg every 6 as needed Palliative care consulted for pain management. LE [place] for LE pain negative.  Trial flexeril.  Increase gabapentin.   Panic attacks: Continue benzo as needed  Gastritis: EGD with gastritis PPI x 6 weeks then 40 mg daily Colonoscopy with 5 polyps removed (1 in ascending and 4 in sigmoid) bx as noted above   Ovarian mass: Noted on MRI pelvis Workup as noted above.     Acute lower UTI-resolved as of 12/24/2020 S/p ceftriaxone Culture with strep viridans resolved      Malnutrition of moderate degree - Moderate malnutrition in context of acute illness/injury, morbid obesity - Evaluated by dietitian, appreciate assistance - per RD: "Moderate Malnutrition related to acute illness as evidenced by percent weight loss, moderate muscle depletion, energy intake < 75% for > 7 days."    OSA (obstructive sleep apnea) Continue CPAP.   Diabetes mellitus ([place]) Continue SSI   Essential hypertension Continue amlodipine, coreg    DVT prophylaxis: Lovenox Code Status: Full code Family Communication: No family at bedside. Disposition Plan:   Status is: Inpatient  Remains inpatient appropriate because: Patient is medically clear.   Yes.  Awaiting CIR.  Patient is medically clear for discharge : Yes  Consultants:  Oncology IR Urology GI Palliative care D1  oncology  Procedures:  Diagnostic cystoscopy, bladder biopsy, urethral biopsy, 12/17/20 Cervical biopsies, 12/17/2020 EGD and colonoscopy 12/22/20 Port placement by IR 11/17   Antimicrobials:  Anti-infectives (From admission, onward)    Start     Dose/Rate Route Frequency Ordered Stop   12/23/20 1000  cefTRIAXone (ROCEPHIN) 1 g in sodium chloride 0.9 % 100 mL IVPB        1 g 200 mL/hr over 30 Minutes Intravenous Every 24 hours 12/23/20 0746 12/24/20 1030   12/20/20 1000  cefTRIAXone (ROCEPHIN) 1 g in sodium chloride 0.9 % 100 mL IVPB        1 g 200 mL/hr over 30 Minutes Intravenous Every 24 hours 12/20/20 0752 12/22/20 1451   12/17/20 0930  ceFAZolin (ANCEF) 3 g in dextrose 5 % 50 mL IVPB  Status:  Discontinued        3 g 100 mL/hr over 30 Minutes Intravenous  Once 12/17/20 0925 12/17/20 0926   12/17/20 0915  ceFAZolin (ANCEF) IVPB 3g/100 mL premix        3 g 200 mL/hr over 30 Minutes Intravenous  Once 12/17/20 0914 12/17/20 0935        Subjective: Patient was seen and examined at bedside.  Overnight events noted.   Patient reports feeling much improved.  She was sitting on the side of the bed having breakfast..  She is awaiting bed placement in CIR  Objective: Vitals:   01/05/21 0653 01/05/21 0903 01/05/21 0952 01/05/21 1233  BP: 138/76 (!) 147/81 (!) 141/73 (!) 149/83  Pulse: 94 94  96  Resp: 18 16  18   Temp: 98 F (36.7 C) 98.2 F (36.8  C)  (!) 97.5 F (36.4 C)  TempSrc:  Oral  Oral  SpO2: 93% 95%  96%  Weight:      Height:        Intake/Output Summary (Last 24 hours) at 01/05/2021 1334 Last data filed at 01/05/2021 0953 Gross per 24 hour  Intake 810 ml  Output 900 ml  Net -90 ml   Filed Weights   12/16/20 1307 12/17/20 0841 12/22/20 0928  Weight: 127 kg 127 kg 127 kg    Examination:  General exam: Comfortable. Not in any acute  distress, deconditioned. Respiratory system: Clear to auscultation B/L. Respiratory effort normal. RR 17 Cardiovascular  system: S1-S2 heard, regular rate and rhythm, no murmur. Gastrointestinal system: Abdomen is soft, mildly distended, nontender, BS +. Central nervous system: Alert and oriented x 3 . No focal neurological deficits. Extremities: Edema 1+, no cyanosis, no clubbing. Skin: No rashes, lesions or ulcers Psychiatry: Judgement and insight appear normal. Mood & affect appropriate.     Data Reviewed: I have personally reviewed following labs and imaging studies  CBC: Recent Labs  Lab 12/30/20 1044 12/31/20 0521 01/01/21 0830 01/02/21 0550 01/04/21 0500 01/05/21 0658  WBC 9.0 7.8 6.8 6.1 5.8 6.4  NEUTROABS 7.7 6.4  --   --   --   --   HGB 7.9* 6.6* 7.6* 7.0* 7.1* 7.4*  HCT 25.3* 20.5* 24.5* 22.7* 22.8* 23.3*  MCV 95.1 94.9 94.2 95.8 95.4 95.1  PLT 210 155 177 150 147* 258   Basic Metabolic Panel: Recent Labs  Lab 12/30/20 1044 12/31/20 0521 01/02/21 0550 01/03/21 0620 01/04/21 0500  NA 137 137 135 138 137  K 4.3 4.2 4.1 4.0 3.8  CL 104 104 104 107 104  CO2 26 24 24 25 25   GLUCOSE 125* 136* 123* 108* 122*  BUN 18 19 22* 17 14  CREATININE 0.68 0.72 0.72 0.61 0.68  CALCIUM 9.3 8.9 8.8* 9.0 9.1  MG  --  2.3  --   --   --   PHOS  --  3.2  --   --   --    GFR: Estimated Creatinine Clearance: 106.1 mL/min (by C-G formula based on SCr of 0.68 mg/dL). Liver Function Tests: Recent Labs  Lab 12/30/20 1044 12/31/20 0521  AST 82* 55*  ALT 27 21  ALKPHOS 933* 738*  BILITOT 0.7 0.6  PROT 6.6 5.8*  ALBUMIN 2.1* 1.9*   No results for input(s): LIPASE, AMYLASE in the last 168 hours. No results for input(s): AMMONIA in the last 168 hours. Coagulation Profile: No results for input(s): INR, PROTIME in the last 168 hours.  Cardiac Enzymes: No results for input(s): CKTOTAL, CKMB, CKMBINDEX, TROPONINI in the last 168 hours. BNP (last 3 results) No results for input(s): PROBNP in the last 8760 hours. HbA1C: No results for input(s): HGBA1C in the last 72 hours. CBG: Recent Labs   Lab 01/04/21 1222 01/04/21 1637 01/04/21 2116 01/05/21 0756 01/05/21 1210  GLUCAP 148* 129* 152* 123* 113*   Lipid Profile: No results for input(s): CHOL, HDL, LDLCALC, TRIG, CHOLHDL, LDLDIRECT in the last 72 hours. Thyroid Function Tests: No results for input(s): TSH, T4TOTAL, FREET4, T3FREE, THYROIDAB in the last 72 hours. Anemia Panel: No results for input(s): VITAMINB12, FOLATE, FERRITIN, TIBC, IRON, RETICCTPCT in the last 72 hours. Sepsis Labs: No results for input(s): PROCALCITON, LATICACIDVEN in the last 168 hours.  Recent Results (from the past 240 hour(s))  Culture, blood (routine x 2)     Status: Abnormal  Collection Time: 12/30/20 10:44 AM   Specimen: BLOOD  Result Value Ref Range Status   Specimen Description   Final    BLOOD LEFT ANTECUBITAL Performed at [facility] [address] [place], [place] [address]    Special Requests   Final    BOTTLES DRAWN AEROBIC AND ANAEROBIC Blood Culture adequate volume Performed at [facility] [address], [place],  [place] [address]    Culture  Setup Time   Final    GRAM POSITIVE COCCI AEROBIC BOTTLE ONLY CRITICAL RESULT CALLED TO, READ BACK BY AND VERIFIED WITH: PHARMD J GADHIA [id number] AT 35 AM BY CM    Culture (A)  Final    STAPHYLOCOCCUS HOMINIS THE SIGNIFICANCE OF ISOLATING THIS ORGANISM FROM A SINGLE SET OF BLOOD CULTURES WHEN MULTIPLE SETS ARE DRAWN IS UNCERTAIN. PLEASE NOTIFY THE MICROBIOLOGY DEPARTMENT WITHIN ONE WEEK IF SPECIATION AND SENSITIVITIES ARE REQUIRED. Performed at [facility], [place] [address], [place], [place] [address]    Report Status 01/01/2021 FINAL  Final  Blood Culture ID Panel (Reflexed)     Status: Abnormal   Collection Time: 12/30/20 10:44 AM  Result Value Ref Range Status   Enterococcus faecalis NOT DETECTED NOT DETECTED Final   Enterococcus Faecium NOT DETECTED NOT DETECTED Final   Listeria monocytogenes NOT DETECTED NOT DETECTED Final    Staphylococcus species DETECTED (A) NOT DETECTED Final    Comment: CRITICAL RESULT CALLED TO, READ BACK BY AND VERIFIED WITH: PHARMD J GADHIA [id number] AT 844 AM BY CM    Staphylococcus aureus (BCID) NOT DETECTED NOT DETECTED Final   Staphylococcus epidermidis NOT DETECTED NOT DETECTED Final   Staphylococcus lugdunensis NOT DETECTED NOT DETECTED Final   Streptococcus species NOT DETECTED NOT DETECTED Final   Streptococcus agalactiae NOT DETECTED NOT DETECTED Final   Streptococcus pneumoniae NOT DETECTED NOT DETECTED Final   Streptococcus pyogenes NOT DETECTED NOT DETECTED Final   A.calcoaceticus-baumannii NOT DETECTED NOT DETECTED Final   Bacteroides fragilis NOT DETECTED NOT DETECTED Final   Enterobacterales NOT DETECTED NOT DETECTED Final   Enterobacter cloacae complex NOT DETECTED NOT DETECTED Final   Escherichia coli NOT DETECTED NOT DETECTED Final   Klebsiella aerogenes NOT DETECTED NOT DETECTED Final   Klebsiella oxytoca NOT DETECTED NOT DETECTED Final   Klebsiella pneumoniae NOT DETECTED NOT DETECTED Final   Proteus species NOT DETECTED NOT DETECTED Final   Salmonella species NOT DETECTED NOT DETECTED Final   Serratia marcescens NOT DETECTED NOT DETECTED Final   Haemophilus influenzae NOT DETECTED NOT DETECTED Final   Neisseria meningitidis NOT DETECTED NOT DETECTED Final   Pseudomonas aeruginosa NOT DETECTED NOT DETECTED Final   Stenotrophomonas maltophilia NOT DETECTED NOT DETECTED Final   Candida albicans NOT DETECTED NOT DETECTED Final   Candida auris NOT DETECTED NOT DETECTED Final   Candida glabrata NOT DETECTED NOT DETECTED Final   Candida krusei NOT DETECTED NOT DETECTED Final   Candida parapsilosis NOT DETECTED NOT DETECTED Final   Candida tropicalis NOT DETECTED NOT DETECTED Final   Cryptococcus neoformans/gattii NOT DETECTED NOT DETECTED Final    Comment: Performed at [facility], [place] [address] [place], [place] [address]  Culture, blood (routine x 2)      Status: None   Collection Time: 12/30/20 10:49 AM   Specimen: BLOOD  Result Value Ref Range Status   Specimen Description   Final    BLOOD BLOOD RIGHT FOREARM Performed at [facility] [address] [place], [place]  [address]    Special Requests   Final    BOTTLES DRAWN AEROBIC AND ANAEROBIC Blood Culture adequate volume Performed at [facility] [address], [place], [place] [address]    Culture   Final    NO GROWTH 5 DAYS Performed at [facility], [place] [address] [place], [place] [address]    Report Status 01/04/2021 FINAL  Final  Urine Culture     Status: Abnormal   Collection Time: 12/30/20 12:31 PM   Specimen: Urine, Clean Catch  Result Value Ref Range Status   Specimen Description   Final    URINE, CLEAN CATCH Performed at [facility], [place] [address] [place], [place] [address]    Special Requests   Final    NONE Performed at [facility], [place] [address] [place], [place] [address]    Culture MULTIPLE SPECIES PRESENT, SUGGEST RECOLLECTION (A)  Final   Report Status 01/01/2021 FINAL  Final  Culture, blood (routine x 2)     Status: None   Collection Time: 12/31/20 12:39 PM   Specimen: Left Antecubital; Blood  Result Value Ref Range Status   Specimen Description   Final    LEFT ANTECUBITAL Performed at [facility] [address] [place], [place] [address]    Special Requests   Final    BOTTLES DRAWN AEROBIC AND ANAEROBIC Blood Culture adequate volume Performed at [facility] [address], [place], [place] [address]    Culture   Final    NO GROWTH 5 DAYS Performed at [facility], [place] [address] [place], [place] [address]    Report Status 01/05/2021 FINAL  Final  Culture, blood (routine x 2)     Status: None   Collection Time: 12/31/20 12:45 PM   Specimen: BLOOD LEFT HAND  Result Value Ref Range Status   Specimen  Description   Final    BLOOD LEFT HAND Performed at [facility] [address] [place], [place] [address]    Special Requests   Final    BOTTLES DRAWN AEROBIC ONLY Blood Culture adequate volume Performed at [facility] [address] [place], [place] [address]    Culture   Final    NO GROWTH 5 DAYS Performed at [facility], [place] [address] [place], [place] [address]    Report Status 01/05/2021 FINAL  Final    Radiology Studies: No results found.  Scheduled Meds:  amLODipine  5 mg Oral Daily   carvedilol  6.25 mg Oral BID WC   Chlorhexidine Gluconate Cloth  6 each Topical Daily   enoxaparin (LOVENOX) injection  60 mg Subcutaneous Q24H   feeding supplement  237 mL Oral TID BM   gabapentin  300 mg Oral QHS   insulin aspart  0-9 Units Subcutaneous TID WC   multivitamin with minerals  1 tablet Oral Daily   oxyCODONE  40 mg Oral Q12H   pantoprazole  40 mg Oral BID   polyethylene glycol  17 g Oral Daily   senna-docusate  1 tablet Oral BID   sodium chloride flush  10-40 mL Intracatheter Q12H   Continuous Infusions:   LOS: 20 days    Time spent: 25 mins    Shawna Clamp, MD [facility]   If 7PM-7AM, please contact night-coverage

## 2021-01-05 NOTE — TOC Progression Note (Signed)
Transition of Care [facility]) - Progression Note    Patient Details  Name: Erin [name] MRN: [id number] Date of Birth: [birth year]  Transition of Care [facility]) CM/SW Contact  Ross Ludwig, [place] Phone Number: 01/05/2021, 6:45 PM  Clinical Narrative:     Waiting for bed at CIR, currently no beds available, CIR to review on Monday.   Expected Discharge Plan: IP Rehab Facility Barriers to Discharge: Continued Medical Work up, Inadequate or no insurance  Expected Discharge Plan and Services Expected Discharge Plan: [place] In-house Referral: Clinical Social Work   Post Acute Care Choice: IP Rehab Living arrangements for the past 2 months: Apartment                 DME Arranged: N/A DME Agency: NA                   Social Determinants of Health (SDOH) Interventions    Readmission Risk Interventions No flowsheet data found.

## 2021-01-05 NOTE — Progress Notes (Signed)
   01/05/21 1339  Mobility  Activity Ambulated in hall  Level of Assistance Contact guard assist, steadying assist  Assistive Device Front wheel walker  Distance Ambulated (ft) 50 ft  Mobility Ambulated with assistance in hallway  Mobility Response Tolerated well;Tolerated fair  Mobility performed by Mobility specialist  $Mobility charge 1 Mobility   Pt agreeable to mobilize this afternoon. Required min A to stand from chair. Ambulated about 49ft in hallway with RW, tolerated fair. She stated she was experiencing pain to the outer right leg and lower abdomen. Pain was at 8/10 the entire session. Left pt on EOB, per her request, with call bell at side. Notified NT that pt needed new purewick. Notified RN of session.  [place] Specialist Acute Rehab Services Office: [phone]

## 2021-01-05 NOTE — Plan of Care (Signed)
  Problem: Health Behavior/Discharge Planning: Goal: Ability to manage health-related needs will improve Outcome: Progressing   Problem: Clinical Measurements: Goal: Ability to maintain clinical measurements within normal limits will improve Outcome: Progressing Goal: Will remain free from infection Outcome: Progressing Goal: Diagnostic test results will improve Outcome: Progressing   Problem: Activity: Goal: Risk for activity intolerance will decrease Outcome: Progressing   Problem: Pain Managment: Goal: General experience of comfort will improve Outcome: Progressing   Problem: Safety: Goal: Ability to remain free from injury will improve Outcome: Progressing   Problem: Skin Integrity: Goal: Risk for impaired skin integrity will decrease Outcome: Progressing

## 2021-01-06 ENCOUNTER — Encounter (HOSPITAL_COMMUNITY): Payer: Self-pay | Admitting: Oncology

## 2021-01-06 LAB — CBC
HCT: 23.7 % — ABNORMAL LOW (ref 36.0–46.0)
Hemoglobin: 7.4 g/dL — ABNORMAL LOW (ref 12.0–15.0)
MCH: 30.2 pg (ref 26.0–34.0)
MCHC: 31.2 g/dL (ref 30.0–36.0)
MCV: 96.7 fL (ref 80.0–100.0)
Platelets: 150 10*3/uL (ref 150–400)
RBC: 2.45 MIL/uL — ABNORMAL LOW (ref 3.87–5.11)
RDW: 14.9 % (ref 11.5–15.5)
WBC: 6.5 10*3/uL (ref 4.0–10.5)
nRBC: 3.7 % — ABNORMAL HIGH (ref 0.0–0.2)

## 2021-01-06 LAB — GLUCOSE, CAPILLARY
Glucose-Capillary: 109 mg/dL — ABNORMAL HIGH (ref 70–99)
Glucose-Capillary: 115 mg/dL — ABNORMAL HIGH (ref 70–99)
Glucose-Capillary: 121 mg/dL — ABNORMAL HIGH (ref 70–99)
Glucose-Capillary: 121 mg/dL — ABNORMAL HIGH (ref 70–99)

## 2021-01-06 NOTE — Progress Notes (Addendum)
HEMATOLOGY-ONCOLOGY PROGRESS NOTE  SUBJECTIVE: Pain is adequately controlled.  She denies nausea and vomiting.  Remains afebrile.  PHYSICAL EXAMINATION:  Vitals:   01/05/21 2130 01/06/21 0536  BP: 116/81 (!) 150/81  Pulse: 99 98  Resp: 16 12  Temp: 98.5 F (36.9 C) 97.9 F (36.6 C)  SpO2: 98%    Filed Weights   12/16/20 1307 12/17/20 0841 12/22/20 0928  Weight: 127 kg 127 kg 127 kg    Intake/Output from previous day: 11/27 0701 - 11/28 0700 In: 190 [P.O.:180; I.V.:10] Out: 650 [Urine:650] ABDOMEN: Soft, tender with mild firmness in the left mid and lower abdomen NEURO: alert & oriented x 3 with fluent speech, no focal motor/sensory deficits Vascular: No leg edema  Port-A-Cath without erythema  LABORATORY DATA:  I have reviewed the data as listed CMP Latest Ref Rng & Units 01/04/2021 01/03/2021 01/02/2021  Glucose 70 - 99 mg/dL 122(H) 108(H) 123(H)  BUN 6 - 20 mg/dL 14 17 22(H)  Creatinine 0.44 - 1.00 mg/dL 0.68 0.61 0.72  Sodium 135 - 145 mmol/L 137 138 135  Potassium 3.5 - 5.1 mmol/L 3.8 4.0 4.1  Chloride 98 - 111 mmol/L 104 107 104  CO2 22 - 32 mmol/L _0 Calcium 8.9 - 10.3 mg/dL 9.1 9.0 8.8(L)  Total Protein 6.5 - 8.1 g/dL - - -  Total Bilirubin 0.3 - 1.2 mg/dL - - -  Alkaline Phos 38 - 126 U/L - - -  AST 15 - 41 U/L - - -  ALT 0 - 44 U/L - - -    Lab Results  Component Value Date   WBC 6.5 01/06/2021   HGB 7.4 (L) 01/06/2021   HCT 23.7 (L) 01/06/2021   MCV 96.7 01/06/2021   PLT 150 01/06/2021   NEUTROABS 6.4 12/31/2020    DG Chest 2 View  Result Date: 12/16/2020 CLINICAL DATA:  Shortness of breath in a 55 year old female. EXAM: CHEST - 2 VIEW COMPARISON:  July 10, 2009. FINDINGS: Linear opacities in the LEFT chest of progressed slightly since the previous study. No lobar consolidation. Cardiomediastinal contours and hilar structures are stable. No sign of effusion. Mildly increased density in the subcarinal region on lateral projection. No acute  skeletal process on limited assessment. IMPRESSION: Signs of scarring or atelectasis in the LEFT chest, slightly progressed since the previous study. Question developing subcarinal adenopathy, based on lateral projection. Developing lower lobe airspace disease is also a differential consideration. In a patient with pelvic masses on recent pelvic MRI would consider follow-up chest CT for further evaluation. Electronically Signed   By: Zetta Bills M.D.   On: 12/16/2020 14:13   CT CHEST W CONTRAST  Result Date: 12/17/2020 CLINICAL DATA:  Cancer of unknown primary.  Staging. EXAM: CT CHEST WITH CONTRAST TECHNIQUE: Multidetector CT imaging of the chest was performed during intravenous contrast administration. CONTRAST:  90m OMNIPAQUE IOHEXOL 350 MG/ML SOLN COMPARISON:  Chest CT dated 06/01/2007. FINDINGS: Cardiovascular: There is no cardiomegaly or pericardial effusion. Three-vessel coronary vascular calcification. Mild atherosclerotic calcification of the thoracic aorta. No aneurysmal dilatation. The origins of the great vessels of the aortic arch appear patent as visualized. There is dilatation of the main pulmonary trunk suggestive of pulmonary hypertension. Evaluation of the pulmonary arteries is limited due to respiratory motion artifact and suboptimal opacification and timing of the contrast. No large or central pulmonary artery embolus identified. Mediastinum/Nodes: No hilar or mediastinal adenopathy. The esophagus is grossly unremarkable. Prior right hemithyroidectomy. No mediastinal fluid collection. Lungs/Pleura:  Bibasilar subpleural atelectasis/scarring. There is no pleural effusion pneumothorax. The central airways are patent. Upper Abdomen: Indeterminate 3 cm left renal nodule, present on the prior CT, likely a benign etiology such as adenoma. Musculoskeletal: Degenerative changes of the spine. No acute osseous pathology. IMPRESSION: 1. No acute intrathoracic pathology. No CT evidence of central  pulmonary artery embolus. 2. Dilatation of the main pulmonary trunk suggestive of pulmonary hypertension. 3. Status post prior right hemithyroidectomy. 4. Bilateral linear atelectasis/scarring. 5. Aortic Atherosclerosis (ICD10-I70.0). Electronically Signed   By: Anner Crete M.D.   On: 12/17/2020 21:09   MR Lumbar Spine W Wo Contrast  Result Date: 12/16/2020 CLINICAL DATA:  Initial evaluation for low back pain, cancer suspected. EXAM: MRI LUMBAR SPINE WITHOUT AND WITH CONTRAST TECHNIQUE: Multiplanar and multiecho pulse sequences of the lumbar spine were obtained without and with intravenous contrast. CONTRAST:  66m GADAVIST GADOBUTROL 1 MMOL/ML IV SOLN COMPARISON:  Prior CT from earlier the same day. FINDINGS: Segmentation: Standard. Lowest well-formed disc space labeled the L5-S1 level. Alignment: 4 mm facet mediated anterolisthesis of L4 on L5. Alignment otherwise normal with preservation of the normal lumbar lordosis. Vertebrae: Diffusely abnormal appearance of the bone marrow is seen throughout the visualized lumbar spine and pelvis. Associated heterogeneous STIR hyperintensity with irregular heterogeneous postcontrast enhancement. Findings are highly suspicious for diffuse osseous metastatic disease. Involvement appears to be most pronounced within the T12 vertebral body as well as the visualized pelvis. Exact measurements of a discrete lesion is difficult given the overall infiltrative appearance of this finding. No associated pathologic fracture. No visible extra osseous extension of tumor at this time. Conus medullaris and cauda equina: Conus extends to the L1 level. Conus and cauda equina appear normal. No visible epidural or intracanalicular tumor. Paraspinal and other soft tissues: Mild edema within the subcutaneous fat of the lower back, which could be related to overall volume status. Paraspinous soft tissues demonstrate no other acute finding. Retroperitoneal/iliac adenopathy noted. Left  larger than right adnexal masses partially visualized. Findings better characterized on prior CT. Disc levels: L1-2:  Unremarkable. L2-3: Disc desiccation with mild disc bulge. Superimposed small right foraminal to extraforaminal disc protrusion (series 11, image 17). Mild facet hypertrophy. Underlying short pedicles with a degree of mild spinal stenosis. Mild bilateral L2 foraminal narrowing. L3-4: Disc desiccation with mild disc bulge. Superimposed shallow left extraforaminal disc protrusion with annular fissure (series 11, image 24). Mild facet hypertrophy. Underlying short pedicles. Mild spinal stenosis. Foramina remain patent. L4-5: 4 mm anterolisthesis. Disc desiccation with broad-based posterior pseudo disc bulge. Biforaminal annular fissures noted, left larger than right. Moderate bilateral facet arthrosis. Resultant moderate spinal stenosis. Mild to moderate bilateral L4 foraminal narrowing. L5-S1: Normal interspace. Mild right greater than left facet hypertrophy. No stenosis. IMPRESSION: 1. 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 No associated pathologic fracture or extra osseous extension of tumor at this time. 2. Retroperitoneal/iliac adenopathy with left larger than right adnexal masses, better characterized on prior CT. 3. Underlying multilevel degenerative spondylosis as above, most pronounced at L4-5 where there is resultant moderate spinal stenosis. Electronically Signed   By: BJeannine BogaM.D.   On: 12/16/2020 23:44   [place] Complete  Result Date: 12/16/2020 CLINICAL DATA:  Pelvic pain EXAM: TRANSABDOMINAL ULTRASOUND OF PELVIS DOPPLER ULTRASOUND OF OVARIES TECHNIQUE: Transabdominal ultrasound examination of the pelvis was performed including evaluation of the uterus, ovaries, adnexal regions, and pelvic cul-de-sac. Color and duplex Doppler  ultrasound was utilized to  evaluate blood flow to the ovaries. COMPARISON:  CT 12/16/2020, MRI 12/05/2020, pelvic ultrasound 12/01/2020 FINDINGS: Uterus Measurements: 6.8 x 3.4 x 5 cm = volume: 60.5 mL. No fibroids or other mass visualized. Endometrium Poorly visible, unable to measure. Right ovary Measurements: 4.8 x 2.9 x 5.5 cm = volume: 38.6 mL. Solid right adnexal mass replaces most of right ovary. Left ovary Measurements: 10.5 x 8.7 x 9.2 cm = volume: 440 mL. Enlarged left ovary with heterogenous solid mass that replaces the left ovary. Previously the left ovary measured 8.3 x 7.7 x 8.5 cm on MRI. Pulsed Doppler evaluation demonstrates normal arterial and venous waveforms in both or adnexa, though note that both ovaries are essentially replaced by mass lesions. Other: No significant ascites. Foley catheter within the bladder which appears somewhat thick walled. IMPRESSION: 1. Very limited exam secondary to habitus and only trans abdominal technique 2. Bilateral solid adnexal masses essentially replacing the ovaries which thereby limits assessment of the ovaries. Left adnexal solid mass lesion has significantly increased in size; documented flow in the adnexa relates to flow within the solid adnexal masses. Gynecology follow-up recommended. Electronically Signed   By: Donavan Foil M.D.   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 EXAM: CT ABDOMEN AND PELVIS WITH CONTRAST TECHNIQUE: Multidetector CT imaging of the abdomen and pelvis was performed using the standard protocol following bolus administration of intravenous contrast. CONTRAST:  85m OMNIPAQUE IOHEXOL 350 MG/ML SOLN COMPARISON:  Comparison is made with multiple prior studies which were performed recently, most recent comparison is an MRI of the pelvis of December 05, 2020. FINDINGS: Lower chest: Basilar atelectasis.  Subcarinal region not well evaluated. No consolidation or effusion at the lung bases. Hepatobiliary: No focal, suspicious hepatic lesion. No pericholecystic stranding. Portal vein is patent. No biliary duct dilation. Pancreas: Pancreas with normal contours. No signs of adjacent inflammation. Spleen: Spleen normal size and contour. Adrenals/Urinary Tract: Well-circumscribed LEFT adrenal lesion, density value of 19 Hounsfield units on previous noncontrast imaging measuring 3.2 x 2.6 cm., relative washout is indeterminate at 30%. Normal RIGHT adrenal. Normal, symmetric enhancement of bilateral kidneys without focal renal lesion. Stomach/Bowel: Small hiatal hernia. No stranding adjacent to the stomach. No sign of small bowel obstruction or acute small bowel process. The appendix is normal. Stool in various parts of the colon without signs of obstruction or adjacent stranding. Vascular/Lymphatic: Retroperitoneal adenopathy (image 50/2) 14 mm lymph node previously 11 mm along the LEFT common iliac chain. LEFT pelvic sidewall lymph node (image 75/2) 14 mm short axis, previously 13 mm. Similar size of RIGHT pelvic sidewall/external iliac lymph nodes also with little change compared to the most recent comparison imaging. Reproductive: Foley catheter in situ. Uterus and cervix not well at assess nor are the masses within the area of the cervix and urethra seen on previous MRI. Increasing size of LEFT ovary with heterogeneous appearance now measuring 10 x 8.8 cm. When assessed on the study of October 20, 2020 the ovary for reference measured 3.7 x 2.5 cm. RIGHT ovary with area of enhancement shows enlargement as well but not as pronounced as on previous imaging with 2.8 cm area of enhancement in the RIGHT ovary as the largest discrete area which is similar to the recent MRI evaluation. As compared to the study of October 20, 2020 there is little change  in the appearance of the RIGHT ovary aside from this area though there  is an increasingly nodular appearance of the ovary in this location. Mass posterior to the LEFT ovary just above the uterus measuring 4.4 x 2.8 cm has enlarged from 2.5 cm. Other: Small volume ascites. Musculoskeletal: Heterogeneous pattern associated with the spine in terms of density raises the question of metastatic disease without discrete measurable focal lesion in the spine. For example a geographic area of sclerosis on image 101/5) in the anterior L3 vertebral body does raise the question of underlying lesion with numerous additional foci, essentially nearly all levels of the spine with some degree of heterogeneity. Another example of this finding is noted on the RIGHT at the T10 level. No acute bone finding or frankly destructive bone process. IMPRESSION: Rapid increase in size of the LEFT ovary with some adjacent ascites. The rapid enlargement is seen in the setting of generalized worsening of masses associated with the LEFT and RIGHT ovary but perhaps more pronounced than other areas in the pelvis. The possibility of torsion associated with an ovarian mass could be considered based on this finding. Would correlate with any worsening abdominal pain and with sonogram as warranted for further assessment. Enlarging masses elsewhere though not to the extent that is seen with the LEFT ovary. Masslike area of the cervix and potentially within a urethral diverticulum not as well seen as on the recent MRI evaluation. Small volume ascites. Well-circumscribed LEFT adrenal lesion measuring 3.2 x 2.6 cm, density value of 19 Hounsfield units on previous noncontrast imaging. Relative washout is indeterminate at 30%. This may represent an adrenal adenoma. Consider dedicated adrenal protocol CT or attention on PET evaluation if performed. Heterogeneous pattern of subtle sclerosis and lucency in the spine on today's study raising the question of bony metastatic disease in the spine. Findings of increased ovarian size on the  LEFT out of proportion other areas were called by telephone at the time of interpretation on 12/16/2020 at 5:44 pm to provider Theodis Blaze , who verbally acknowledged these results. Electronically Signed   By: Zetta Bills M.D.   On: 12/16/2020 17:46   [place] Art/Ven Flow Abd Pelv Doppler  Result Date: 12/16/2020 CLINICAL DATA:  Pelvic pain EXAM: TRANSABDOMINAL ULTRASOUND OF PELVIS DOPPLER ULTRASOUND OF OVARIES TECHNIQUE: Transabdominal ultrasound examination of the pelvis was performed including evaluation of the uterus, ovaries, adnexal regions, and pelvic cul-de-sac. Color and duplex Doppler ultrasound was utilized to evaluate blood flow to the ovaries. COMPARISON:  CT 12/16/2020, MRI 12/05/2020, pelvic ultrasound 12/01/2020 FINDINGS: Uterus Measurements: 6.8 x 3.4 x 5 cm = volume: 60.5 mL. No fibroids or other mass visualized. Endometrium Poorly visible, unable to measure. Right ovary Measurements: 4.8 x 2.9 x 5.5 cm = volume: 38.6 mL. Solid right adnexal mass replaces most of right ovary. Left ovary Measurements: 10.5 x 8.7 x 9.2 cm = volume: 440 mL. Enlarged left ovary with heterogenous solid mass that replaces the left ovary. Previously the left ovary measured 8.3 x 7.7 x 8.5 cm on MRI. Pulsed Doppler evaluation demonstrates normal arterial and venous waveforms in both or adnexa, though note that both ovaries are essentially replaced by mass lesions. Other: No significant ascites. Foley catheter within the bladder which appears somewhat thick walled. IMPRESSION: 1. Very limited exam secondary to habitus and only trans abdominal technique 2. Bilateral solid adnexal masses essentially replacing the ovaries which thereby limits assessment of the ovaries. Left adnexal solid mass lesion has significantly increased in size; documented flow  in the adnexa relates to flow within the solid adnexal masses. Gynecology follow-up recommended. Electronically Signed   By: Donavan Foil M.D.   On: 12/16/2020 19:23   DG  CHEST PORT 1 VIEW  Result Date: 12/31/2020 CLINICAL DATA:  Chest pain EXAM: PORTABLE CHEST 1 VIEW COMPARISON:  Previous studies including the examination of 12/30/2020 FINDINGS: Transverse diameter of heart is increased. There are no signs of pulmonary edema or focal pulmonary consolidation. There are small linear densities in both mid lung fields and left lower lung fields suggesting subsegmental atelectasis with interval improvement in the left lung and possible slight worsening in the right mid lung fields. IMPRESSION: Cardiomegaly. There are no signs of pulmonary edema or focal pulmonary consolidation. There are small linear densities in the both mid and left lower lung fields suggesting subsegmental atelectasis with some improvement in the left lung. Electronically Signed   By: Elmer Picker M.D.   On: 12/31/2020 13:39   DG CHEST PORT 1 VIEW  Result Date: 12/30/2020 CLINICAL DATA:  Fever, abdominal pain EXAM: PORTABLE CHEST 1 VIEW COMPARISON:  Previous studies including the CT chest done on 12/17/2020 FINDINGS: Transverse diameter of heart is increased. There are linear densities in both parahilar regions and medial left lower lung fields suggesting scarring or subsegmental atelectasis with interval worsening in the left lower lung fields. There is no significant pleural effusion or pneumothorax. Surgical clips are seen in the right thyroid bed. IMPRESSION: Cardiomegaly. There are linear densities in both parahilar regions and left lower lung fields with interval worsening suggesting subsegmental atelectasis. There are no signs of alveolar pulmonary edema or focal pulmonary consolidation. Electronically Signed   By: Elmer Picker M.D.   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 EXAM: IMPLANTED PORT A  CATH PLACEMENT WITH ULTRASOUND AND FLUOROSCOPIC GUIDANCE COMPARISON:  None. MEDICATIONS: None. ANESTHESIA/SEDATION: Moderate (conscious) sedation was employed during this procedure. A total of Versed 3 mg and Fentanyl 100 mcg was administered intravenously. Moderate Sedation Time: 19 minutes. The patient's level of consciousness and vital signs were monitored continuously by radiology nursing throughout the procedure under my direct supervision. CONTRAST:  None FLUOROSCOPY TIME:  0 minutes, 12 seconds (5 mGy) COMPLICATIONS: None immediate. PROCEDURE: The procedure, risks, benefits, and alternatives were explained to the patient. Questions regarding the procedure were encouraged and answered. The patient understands and consents to the procedure. The right neck and chest were prepped with chlorhexidine in a sterile fashion, and a sterile drape was applied covering the operative field. Maximum barrier sterile technique with sterile gowns and gloves were used for the procedure. A timeout was performed prior to the initiation of the procedure. Ultrasound was used to examine the jugular vein which was compressible and free of internal echoes. A skin marker was used to demarcate the planned venotomy and port pocket incision sites. Local anesthesia was provided to these sites and the subcutaneous tunnel track with 1% lidocaine with 1:100,000 epinephrine. A small incision was created at the jugular access site and blunt dissection was performed of the subcutaneous tissues. Under ultrasound guidance, the jugular vein was accessed with a 21 ga micropuncture needle and an 0.018" wire was inserted to the superior vena cava. Real-time ultrasound guidance was utilized for vascular access including the acquisition of a permanent ultrasound image documenting patency of the accessed vessel. A 5 Fr micopuncture set  was then used, through which a 0.035" Rosen wire was passed under fluoroscopic guidance into the inferior vena cava. An  8 Fr dilator was then placed over the wire. A subcutaneous port pocket was then created along the upper chest wall utilizing a combination of sharp and blunt dissection. The pocket was irrigated with sterile saline, packed with gauze, and observed for hemorrhage. A single lumen "ISP" sized power injectable port was chosen for placement. The 8 Fr catheter was tunneled from the port pocket site to the venotomy incision. The port was placed in the pocket. The external catheter was trimmed to appropriate length. The dilator was exchanged for an 8 Fr peel-away sheath under fluoroscopic guidance. The catheter was then placed through the sheath and the sheath was removed. Final catheter positioning was confirmed and documented with a fluoroscopic spot radiograph. The port was accessed with a Huber needle, aspirated, and flushed with heparinized saline. The deep dermal layer of the port pocket incision was closed with interrupted 3-0 Vicryl suture. The skin was opposed with a running subcuticular 4-0 Monocryl suture. Dermabond was then placed over the port pocket and neck incisions. The patient tolerated the procedure well without immediate post procedural complication. FINDINGS: After catheter placement, the tip lies within the superior cavoatrial junction. The catheter aspirates and flushes normally and is ready for immediate use. IMPRESSION: Successful placement of a power injectable Port-A-Cath via the right internal jugular vein. The catheter is ready for immediate use. Ruthann Cancer, MD [facility] Electronically Signed   By: Ruthann Cancer M.D.   On: 12/26/2020 11:08   VAS [place] LOWER EXTREMITY VENOUS (DVT)  Result Date: 12/29/2020  Lower Venous DVT Study Patient Name:  Erin [name]  Date of Exam:   12/29/2020 Medical Rec #: [id number]            Accession #:    [id number] Date of Birth: Mar 27, 1965            Patient Gender: F Patient Age:   89 years  Exam Location:  [facility] Procedure:      VAS [place] LOWER EXTREMITY VENOUS (DVT) Referring Phys: A POWELL JR --------------------------------------------------------------------------------  Indications: Pain.  Risk Factors: Cancer. Limitations: Body habitus and poor ultrasound/tissue interface. Comparison Study: No prior studies. Performing Technologist: Oliver Hum RVT  Examination Guidelines: A complete evaluation includes B-mode imaging, spectral Doppler, color Doppler, and power Doppler as needed of all accessible portions of each vessel. Bilateral testing is considered an integral part of a complete examination. Limited examinations for reoccurring indications may be performed as noted. The reflux portion of the exam is performed with the patient in reverse Trendelenburg.  +---------+---------------+---------+-----------+----------+--------------+ RIGHT    CompressibilityPhasicitySpontaneityPropertiesThrombus Aging +---------+---------------+---------+-----------+----------+--------------+ CFV      Full           Yes      Yes                                 +---------+---------------+---------+-----------+----------+--------------+ SFJ      Full                                                        +---------+---------------+---------+-----------+----------+--------------+ FV Prox  Full                                                        +---------+---------------+---------+-----------+----------+--------------+  FV Mid   Full                                                        +---------+---------------+---------+-----------+----------+--------------+ FV DistalFull                                                        +---------+---------------+---------+-----------+----------+--------------+ PFV      Full                                                        +---------+---------------+---------+-----------+----------+--------------+ POP       Full           Yes      Yes                                 +---------+---------------+---------+-----------+----------+--------------+ PTV      Full                                                        +---------+---------------+---------+-----------+----------+--------------+ PERO     Full                                                        +---------+---------------+---------+-----------+----------+--------------+   +---------+---------------+---------+-----------+----------+--------------+ LEFT     CompressibilityPhasicitySpontaneityPropertiesThrombus Aging +---------+---------------+---------+-----------+----------+--------------+ CFV      Full           Yes      Yes                                 +---------+---------------+---------+-----------+----------+--------------+ SFJ      Full                                                        +---------+---------------+---------+-----------+----------+--------------+ FV Prox  Full                                                        +---------+---------------+---------+-----------+----------+--------------+ FV Mid   Full                                                        +---------+---------------+---------+-----------+----------+--------------+  FV DistalFull                                                        +---------+---------------+---------+-----------+----------+--------------+ PFV      Full                                                        +---------+---------------+---------+-----------+----------+--------------+ POP      Full           Yes      Yes                                 +---------+---------------+---------+-----------+----------+--------------+ PTV      Full                                                        +---------+---------------+---------+-----------+----------+--------------+ PERO     Full                                                         +---------+---------------+---------+-----------+----------+--------------+     Summary: RIGHT: - There is no evidence of deep vein thrombosis in the lower extremity.  - No cystic structure found in the popliteal fossa.  LEFT: - There is no evidence of deep vein thrombosis in the lower extremity.  - No cystic structure found in the popliteal fossa.  *See table(s) above for measurements and observations. Electronically signed by Jamelle Haring on 12/29/2020 at 12:00:16 PM.    Final     ASSESSMENT AND PLAN: 1.  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 -12/05/2020 MRI of the pelvis-poorly marginated enhancing 3.7 x 2.8 x 3.0 cm mass centered at the urethra, solid avidly enhancing bilateral ovarian masses, poorly marginated enhancing 2.4 x 2.3 x 3.0 cm mass in the left uterine cervix, mild to moderate bilateral common iliac, bilateral external iliac, and bilateral inguinal lymphadenopathy, diffuse patchy confluent nodular replacement of the pelvic osseous structures. -12/13/2020 CEA 1864, CA125 29.2 -12/16/2020 CT abdomen/pelvis-rapidly increasing size of the left ovary with some adjacent ascites, enlarging masses elsewhere, masslike area of the cervix and potentially within a urethral diverticulum, well-circumscribed left adrenal lesion measuring 3.2 x 2.6 cm, heterogeneous pattern of subtle sclerosis and lucency in the spine. -12/16/2020 MRI of the lumbar spine-diffusely abnormal appearance of the bone marrow throughout the visualized lumbar spine and pelvis highly suspicious for diffuse osseous metastatic disease, retroperitoneal/iliac adenopathy with left larger than right adnexal masses. -12/17/2020 CT chest-no acute intrathoracic pathology -Upper endoscopy 12/22/2020-gastritis, gastric nodule biopsy-adenocarcinoma, poorly differentiated with signet ring morphology -Colonoscopy 12/22/2020-polyps removed from the ascending and sigmoid colon,  extrinsic compression of the sigmoid colon-tubulovillous adenoma without high-grade dysplasia, tubular adenoma and hyperplastic polyps -12/27/2020 cycle #1 FOLFOX 2.  Abdominal pain secondary #1  3.  Constipation 4.  Normocytic anemia 5.  Leukocytosis 6.  Protein calorie malnutrition 7.  Obstructive sleep apnea 8.  Diabetes mellitus 9.  Hypertension 10.  Possible pulmonary hypertension noted on CT chest  Erin [name] appears unchanged. Today is day 11 of cycle 1 of FOLFOX.  Her labs remain stable.  Second cycle of chemotherapy is due on 01/10/2021.  Awaiting final discharge planning before scheduling next cycle of chemo.  Pain is adequately controlled at this time.  Recommend continuation of current pain medications.  She has been evaluated by physical therapy who recommends inpatient rehab.  She can be transferred to the inpatient rehab service versus outpatient rehab in a SNF.  We will arrange for outpatient follow-up for her next cycle of chemotherapy.  Recommendations: 1.  Continue current pain medication. 2.  Plan for outpatient PET scan  3.  We will follow-up on molecular testing, HER2, and PD-L1 results. 4.  No need for daily CBC 5.  Okay to transfer to rehab (inpatient versus SNF) when bed available.  We will arrange for outpatient follow-up at the cancer center for her next cycle of chemotherapy.  Future Appointments  Date Time Provider [place]  02/18/2021 10:45 AM [name], PA-C [facility]      LOS: 21 days   [name] 01/06/21  Erin [name] was interviewed and examined.  Her pain is under better control and she is ambulating.  She will be due for cycle 2 FOLFOX on 01/10/2021.  Plan for inpatient rehabilitation is noted.  I am concerned she may have a lengthy rehabilitation stay.  I would not want to delay cycle 2 chemotherapy beyond early next week.  She may also be a candidate for placement in a SNF and physical therapy  there.  Molecular testing results should be available by early next week.  I was present for greater than 50% of today's visit.  I performed medical decision making.

## 2021-01-06 NOTE — Progress Notes (Signed)
PROGRESS NOTE    Erin [name]  NFA:[id number] DOB: [birth year] DOA: 12/16/2020 PCP: Pcp, No   Brief Narrative:  This 55 years old female with PMH significant for OSA on CPAP, HTN, DMII, morbid obesity who presented with worsening abdominal pain and left thigh pain.  She has been undergoing workup for recently discovered cervical/urethral masses on CT in Sept 2022.  Further imaging showed ongoing interval enlargement of the left adnexal area.  There was also associated retroperitoneal and pelvic adenopathy. MRI pelvis also performed on 12/05/2020 showed poorly enhancing mass centered at the urethra, solid enhancing bilateral ovarian masses, poorly enhancing mass involving left uterine cervix, and significant adenopathy involving bilateral common iliac, bilateral external iliac, bilateral inguinal lymph nodes.  Also noted to have pelvic osseous lesions concerning for metastatic disease as well. Patient underwent cystoscopy with ureteral and bladder biopsy on 12/17/2020 she also underwent cervical biopsies following this procedure.  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 PT recommended acute CIR.  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 MRI showed poorly marginated enhancing 3.7 x 2.8 x 3.0 cm mass centered at urethra concerning for malignancy -- solid avidly enhancing bilateral ovarian masses, largest 2.3 cm on Rt and 8.3 cm on  Lt concerning for bilateral ovarian mets -- poorly marginated enhancing 2.4x2.3x3.0 cm mass in L uterine cervix, concerning for malignancy -- bilateral common iliac, bilateral external iliac, and bilateral inguinal LAD, concerning for metastatic disease -- diffuse patchy confluent nodular replacement of the pelvic osseus structures, concerning for osseus metastatic disease (see report) - Biopsy taken during cystoscopy on 12/17/2020 - biopsy has returned on 11/11. 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  gastric nodule bx and notable for adenocarcinoma, poorly differentiated with signet ring morphology.  Focal invastion of muscularis mucosa.  Muscularis propia not present in bx.  Per path reports, this most likely represents primary source of metastatic carcinoma dx on bx of vagina on 11/8. - Oncology following, now s/p port placement 11/17 - Appreciate gyn onc assistance    Suspected metastatic gastric cancer : Oncology awaiting molecular testing to determine treatment options. Cycle 1 folfox started ON 11/18 per oncology, had planned to discharge to CIR on Monday after chemo complete   Fever: > Resolved. Patient has spiked fever could be related to malignancy. Hold abx for now, consider starting if recurrent or spike > 101 F 1 set of cultures from 11/21 with gram positive cocci with staph species (suspect contaminant) Repeat blood cultures NGTD Urine culture contaminated. Repeat CXR with subsegmental atelectasis.  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  - gastric  nodule bx and notable for adenocarcinoma, poorly differentiated with signet ring morphology.  Focal invastion of muscularis mucosa.  Muscularis propia not present in bx.  Per path reports, this most likely represents primary source of metastatic carcinoma dx on bx of vagina on 11/8. - Onco recommended Outpatient PET study as outpatient   Normochromic normocytic anemia: Could be secondary to malignancy. Continue to monitor H&H. Transfuse 1 unit of PRBC.  Posttransfusion hemoglobin 7.6>7.0>7.1>7.4   Chronic pain syndrome: Continue long-acting oxycontin 40 mg twice daily. Reduce short acting oxycodone to 10 mg every 6 as needed Palliative care consulted for pain management. LE [place] for LE pain negative.  Trial flexeril.  Increase gabapentin.   Panic attacks: Continue benzo as needed  Gastritis: EGD with gastritis PPI x 6 weeks then 40 mg daily Colonoscopy with 5 polyps removed (1 in ascending and 4 in sigmoid) bx as noted above   Ovarian mass: Noted on MRI pelvis Workup as noted above.   Acute lower UTI-resolved as of 12/24/2020 S/p ceftriaxone Culture with strep viridans resolved      Malnutrition of moderate degree - Moderate malnutrition in context of acute illness/injury, morbid obesity - Evaluated by dietitian, appreciate assistance - per RD: "Moderate Malnutrition related to acute illness as evidenced by percent weight loss, moderate muscle depletion, energy intake < 75% for > 7 days."    OSA (obstructive sleep apnea) Continue CPAP.   Diabetes mellitus ([place]) Continue SSI   Essential hypertension Continue amlodipine, coreg    DVT prophylaxis: Lovenox Code Status: Full code Family Communication: No family at bedside. Disposition Plan:   Status is: Inpatient  Remains inpatient appropriate because: Patient is medically clear.   Yes.  Awaiting CIR.  Patient is medically clear for discharge : Yes  Consultants:  Oncology IR Urology GI Palliative care D1  oncology  Procedures:  Diagnostic cystoscopy, bladder biopsy, urethral biopsy, 12/17/20 Cervical biopsies, 12/17/2020 EGD and colonoscopy 12/22/20 Port placement by IR 11/17   Antimicrobials:  Anti-infectives (From admission, onward)    Start     Dose/Rate Route Frequency Ordered Stop   12/23/20 1000  cefTRIAXone (ROCEPHIN) 1 g in sodium chloride 0.9 % 100 mL IVPB        1 g 200 mL/hr over 30 Minutes Intravenous Every 24 hours 12/23/20 0746 12/24/20 1030   12/20/20 1000  cefTRIAXone (ROCEPHIN) 1 g in sodium chloride 0.9 % 100 mL IVPB        1 g 200 mL/hr over 30 Minutes Intravenous Every 24 hours 12/20/20 0752 12/22/20 1451   12/17/20 0930  ceFAZolin (ANCEF) 3 g in dextrose 5 % 50 mL IVPB  Status:  Discontinued        3 g 100 mL/hr over 30 Minutes Intravenous  Once 12/17/20 0925 12/17/20 0926   12/17/20 0915  ceFAZolin (ANCEF) IVPB 3g/100 mL premix        3 g 200 mL/hr over 30 Minutes Intravenous  Once 12/17/20 0914 12/17/20 0935        Subjective: Patient was seen and examined at bedside.  Overnight events noted.   She was sitting on the side of the bed having breakfast.  Patient reports feeling better/ slept better last night She is awaiting bed placement in CIR  Objective: Vitals:   01/05/21 1459 01/05/21 2130 01/06/21 0536 01/06/21 1244  BP: (!) 151/86 116/81 (!) 150/81 (!) 144/80  Pulse: 93 99 98 95  Resp: 18 16 12 18   Temp: 98.2 F (36.8 C) 98.5 F (36.9 C)  97.9 F (36.6 C) 98.2 F (36.8 C)  TempSrc: Oral Oral Oral Oral  SpO2: 93% 98%  94%  Weight:      Height:        Intake/Output Summary (Last 24 hours) at 01/06/2021 1439 Last data filed at 01/06/2021 0930 Gross per 24 hour  Intake 420 ml  Output 1050 ml  Net -630 ml   Filed Weights   12/16/20 1307 12/17/20 0841 12/22/20 0928  Weight: 127 kg 127 kg 127 kg    Examination:  General exam: Comfortable.  Deconditioned, not in any distress Respiratory system: Clear to auscultation B/L. Respiratory  effort normal. RR 14 Cardiovascular system: S1-S2 heard, regular rate and rhythm, no murmur. Gastrointestinal system: Abdomen is soft, mildly distended, nontender, BS +. Central nervous system: Alert and oriented x 3 . No focal neurological deficits. Extremities: Edema 1+, no cyanosis, no clubbing. Skin: No rashes, lesions or ulcers Psychiatry: Judgement and insight appear normal. Mood & affect appropriate.     Data Reviewed: I have personally reviewed following labs and imaging studies  CBC: Recent Labs  Lab 12/31/20 0521 01/01/21 0830 01/02/21 0550 01/04/21 0500 01/05/21 0658 01/06/21 0625  WBC 7.8 6.8 6.1 5.8 6.4 6.5  NEUTROABS 6.4  --   --   --   --   --   HGB 6.6* 7.6* 7.0* 7.1* 7.4* 7.4*  HCT 20.5* 24.5* 22.7* 22.8* 23.3* 23.7*  MCV 94.9 94.2 95.8 95.4 95.1 96.7  PLT 155 177 150 147* 152 354   Basic Metabolic Panel: Recent Labs  Lab 12/31/20 0521 01/02/21 0550 01/03/21 0620 01/04/21 0500  NA 137 135 138 137  K 4.2 4.1 4.0 3.8  CL 104 104 107 104  CO2 24 24 25 25   GLUCOSE 136* 123* 108* 122*  BUN 19 22* 17 14  CREATININE 0.72 0.72 0.61 0.68  CALCIUM 8.9 8.8* 9.0 9.1  MG 2.3  --   --   --   PHOS 3.2  --   --   --    GFR: Estimated Creatinine Clearance: 106.1 mL/min (by C-G formula based on SCr of 0.68 mg/dL). Liver Function Tests: Recent Labs  Lab 12/31/20 0521  AST 55*  ALT 21  ALKPHOS 738*  BILITOT 0.6  PROT 5.8*  ALBUMIN 1.9*   No results for input(s): LIPASE, AMYLASE in the last 168 hours. No results for input(s): AMMONIA in the last 168 hours. Coagulation Profile: No results for input(s): INR, PROTIME in the last 168 hours.  Cardiac Enzymes: No results for input(s): CKTOTAL, CKMB, CKMBINDEX, TROPONINI in the last 168 hours. BNP (last 3 results) No results for input(s): PROBNP in the last 8760 hours. HbA1C: No results for input(s): HGBA1C in the last 72 hours. CBG: Recent Labs  Lab 01/05/21 1210 01/05/21 1645 01/05/21 2128  01/06/21 0802 01/06/21 1241  GLUCAP 113* 105* 119* 109* 115*   Lipid Profile: No results for input(s): CHOL, HDL, LDLCALC, TRIG, CHOLHDL, LDLDIRECT in the last 72 hours. Thyroid Function Tests: No results for input(s): TSH, T4TOTAL, FREET4, T3FREE, THYROIDAB in the last 72 hours. Anemia Panel: No results for input(s): VITAMINB12, FOLATE, FERRITIN, TIBC, IRON, RETICCTPCT in the last 72 hours. Sepsis Labs: No results for input(s): PROCALCITON, LATICACIDVEN in the last 168 hours.  Recent Results (from the past 240 hour(s))  Culture, blood (routine x 2)     Status: Abnormal   Collection Time: 12/30/20 10:44 AM   Specimen: BLOOD  Result Value Ref Range Status   Specimen Description  Final    BLOOD LEFT ANTECUBITAL Performed at [facility] [address], [place], [place] [address]    Special Requests   Final    BOTTLES DRAWN AEROBIC AND ANAEROBIC Blood Culture adequate volume Performed at [facility] [address], [place], [place] [address]    Culture  Setup Time   Final    GRAM POSITIVE COCCI AEROBIC BOTTLE ONLY CRITICAL RESULT CALLED TO, READ BACK BY AND VERIFIED WITH: PHARMD J GADHIA [id number] AT 81 AM BY CM    Culture (A)  Final    STAPHYLOCOCCUS HOMINIS THE SIGNIFICANCE OF ISOLATING THIS ORGANISM FROM A SINGLE SET OF BLOOD CULTURES WHEN MULTIPLE SETS ARE DRAWN IS UNCERTAIN. PLEASE NOTIFY THE MICROBIOLOGY DEPARTMENT WITHIN ONE WEEK IF SPECIATION AND SENSITIVITIES ARE REQUIRED. Performed at [facility], [place] [address], [place], [place] [address]    Report Status 01/01/2021 FINAL  Final  Blood Culture ID Panel (Reflexed)     Status: Abnormal   Collection Time: 12/30/20 10:44 AM  Result Value Ref Range Status   Enterococcus faecalis NOT DETECTED NOT DETECTED Final   Enterococcus Faecium NOT DETECTED NOT DETECTED Final   Listeria monocytogenes NOT DETECTED NOT DETECTED Final   Staphylococcus species DETECTED (A) NOT DETECTED  Final    Comment: CRITICAL RESULT CALLED TO, READ BACK BY AND VERIFIED WITH: PHARMD J GADHIA [id number] AT 844 AM BY CM    Staphylococcus aureus (BCID) NOT DETECTED NOT DETECTED Final   Staphylococcus epidermidis NOT DETECTED NOT DETECTED Final   Staphylococcus lugdunensis NOT DETECTED NOT DETECTED Final   Streptococcus species NOT DETECTED NOT DETECTED Final   Streptococcus agalactiae NOT DETECTED NOT DETECTED Final   Streptococcus pneumoniae NOT DETECTED NOT DETECTED Final   Streptococcus pyogenes NOT DETECTED NOT DETECTED Final   A.calcoaceticus-baumannii NOT DETECTED NOT DETECTED Final   Bacteroides fragilis NOT DETECTED NOT DETECTED Final   Enterobacterales NOT DETECTED NOT DETECTED Final   Enterobacter cloacae complex NOT DETECTED NOT DETECTED Final   Escherichia coli NOT DETECTED NOT DETECTED Final   Klebsiella aerogenes NOT DETECTED NOT DETECTED Final   Klebsiella oxytoca NOT DETECTED NOT DETECTED Final   Klebsiella pneumoniae NOT DETECTED NOT DETECTED Final   Proteus species NOT DETECTED NOT DETECTED Final   Salmonella species NOT DETECTED NOT DETECTED Final   Serratia marcescens NOT DETECTED NOT DETECTED Final   Haemophilus influenzae NOT DETECTED NOT DETECTED Final   Neisseria meningitidis NOT DETECTED NOT DETECTED Final   Pseudomonas aeruginosa NOT DETECTED NOT DETECTED Final   Stenotrophomonas maltophilia NOT DETECTED NOT DETECTED Final   Candida albicans NOT DETECTED NOT DETECTED Final   Candida auris NOT DETECTED NOT DETECTED Final   Candida glabrata NOT DETECTED NOT DETECTED Final   Candida krusei NOT DETECTED NOT DETECTED Final   Candida parapsilosis NOT DETECTED NOT DETECTED Final   Candida tropicalis NOT DETECTED NOT DETECTED Final   Cryptococcus neoformans/gattii NOT DETECTED NOT DETECTED Final    Comment: Performed at [facility], [place] [address] [place], [place] [address]  Culture, blood (routine x 2)     Status: None   Collection Time: 12/30/20 10:49 AM    Specimen: BLOOD  Result Value Ref Range Status   Specimen Description   Final    BLOOD BLOOD RIGHT FOREARM Performed at [facility] [address] [place], [place] [address]    Special Requests   Final    BOTTLES DRAWN AEROBIC AND ANAEROBIC Blood Culture adequate volume  Performed at [facility], [place] [address], [place], [place] [address]    Culture   Final    NO GROWTH 5 DAYS Performed at [facility], [place] [address], [place], [place] [address]    Report Status 01/04/2021 FINAL  Final  Urine Culture     Status: Abnormal   Collection Time: 12/30/20 12:31 PM   Specimen: Urine, Clean Catch  Result Value Ref Range Status   Specimen Description   Final    URINE, CLEAN CATCH Performed at [facility], [place] [address] [place], [place] [address]    Special Requests   Final    NONE Performed at [facility], [place] [address], [place], [place] [address]    Culture MULTIPLE SPECIES PRESENT, SUGGEST RECOLLECTION (A)  Final   Report Status 01/01/2021 FINAL  Final  Culture, blood (routine x 2)     Status: None   Collection Time: 12/31/20 12:39 PM   Specimen: Left Antecubital; Blood  Result Value Ref Range Status   Specimen Description   Final    LEFT ANTECUBITAL Performed at [facility] [address], [place], [place] [address]    Special Requests   Final    BOTTLES DRAWN AEROBIC AND ANAEROBIC Blood Culture adequate volume Performed at [facility] [address] [place], [place] [address]    Culture   Final    NO GROWTH 5 DAYS Performed at [facility], [place] [address], [place], [place] [address]    Report Status 01/05/2021 FINAL  Final  Culture, blood (routine x 2)     Status: None   Collection Time: 12/31/20 12:45 PM   Specimen: BLOOD LEFT HAND  Result Value Ref Range Status   Specimen Description   Final    BLOOD LEFT HAND Performed at [facility] [address] [place], [place] [address]    Special Requests   Final    BOTTLES DRAWN AEROBIC ONLY Blood Culture adequate volume Performed at [facility] [address], [place], [place] [address]    Culture   Final    NO GROWTH 5 DAYS Performed at [facility], [place] [address], [place], [place] [address]    Report Status 01/05/2021 FINAL  Final    Radiology Studies: No results found.  Scheduled Meds:  amLODipine  5 mg Oral Daily   carvedilol  6.25 mg Oral BID WC   Chlorhexidine Gluconate Cloth  6 each Topical Daily   enoxaparin (LOVENOX) injection  60 mg Subcutaneous Q24H   feeding supplement  237 mL Oral TID BM   gabapentin  300 mg Oral QHS   insulin aspart  0-9 Units Subcutaneous TID WC   oxyCODONE  40 mg Oral Q12H   pantoprazole  40 mg Oral BID   polyethylene glycol  17 g Oral Daily   senna-docusate  1 tablet Oral BID   sodium chloride flush  10-40 mL Intracatheter Q12H   Continuous Infusions:   LOS: 21 days    Time spent: 25 mins    Shawna Clamp, MD [facility]   If 7PM-7AM, please contact night-coverage

## 2021-01-06 NOTE — Progress Notes (Signed)
Inpatient Rehab Admissions Coordinator:   I continue to follow for bed availability on CIR.  Note labs have stabilized and pt afebrile.  Participating well in therapy.  Shann Medal, PT, DPT Admissions Coordinator [phone] 01/06/21  10:36 AM

## 2021-01-07 ENCOUNTER — Encounter (HOSPITAL_COMMUNITY): Payer: Self-pay

## 2021-01-07 ENCOUNTER — Encounter (HOSPITAL_COMMUNITY): Payer: Self-pay | Admitting: Oncology

## 2021-01-07 LAB — GLUCOSE, CAPILLARY
Glucose-Capillary: 114 mg/dL — ABNORMAL HIGH (ref 70–99)
Glucose-Capillary: 131 mg/dL — ABNORMAL HIGH (ref 70–99)
Glucose-Capillary: 132 mg/dL — ABNORMAL HIGH (ref 70–99)
Glucose-Capillary: 124 mg/dL — ABNORMAL HIGH (ref 70–99)

## 2021-01-07 NOTE — Progress Notes (Signed)
Inpatient Rehab Admissions Coordinator:   Reviewed case again with Dr. Naaman Plummer and Dr. Dagoberto Ligas.  Note that pt tolerance for therapy has improved in the last few days.  Spoke to [name], NP, and pt due for another round of chemo on Friday 12/2.  For a number of reasons, we cannot do chemo infusions on CIR.  Further, given pt response to last chemo dose (low grade fevers, tacchycardia, need for O2 and PRBCs, and decreased tolerance for therapy following), CIR MDs feel pt better suited to slower paced rehab where chemo will not have to be delayed.  CIR will sign off at this time.  TOC aware.   Shann Medal, PT, DPT Admissions Coordinator [phone] 01/07/21  10:48 AM

## 2021-01-07 NOTE — Progress Notes (Addendum)
HEMATOLOGY-ONCOLOGY PROGRESS NOTE  SUBJECTIVE: Pain is better controlled at this time.  She is not having any nausea or vomiting.  She has been ambulating in the hall more.  Afebrile.  PHYSICAL EXAMINATION:  Vitals:   01/07/21 0432 01/07/21 0432  BP: 127/75 127/75  Pulse: 93 93  Resp: 18 18  Temp: 98.2 F (36.8 C) 98.2 F (36.8 C)  SpO2: 96% 97%   Filed Weights   12/16/20 1307 12/17/20 0841 12/22/20 0928  Weight: 127 kg 127 kg 127 kg    Intake/Output from previous day: 11/28 0701 - 11/29 0700 In: 360 [P.O.:360] Out: 1900 [Urine:1900] ABDOMEN: Soft, tender with mild firmness in the left mid and lower abdomen NEURO: alert & oriented x 3 with fluent speech, no focal motor/sensory deficits Vascular: No leg edema  Port-A-Cath without erythema  LABORATORY DATA:  I have reviewed the data as listed CMP Latest Ref Rng & Units 01/04/2021 01/03/2021 01/02/2021  Glucose 70 - 99 mg/dL 122(H) 108(H) 123(H)  BUN 6 - 20 mg/dL 14 17 22(H)  Creatinine 0.44 - 1.00 mg/dL 0.68 0.61 0.72  Sodium 135 - 145 mmol/L 137 138 135  Potassium 3.5 - 5.1 mmol/L 3.8 4.0 4.1  Chloride 98 - 111 mmol/L 104 107 104  CO2 22 - 32 mmol/L _0 Calcium 8.9 - 10.3 mg/dL 9.1 9.0 8.8(L)  Total Protein 6.5 - 8.1 g/dL - - -  Total Bilirubin 0.3 - 1.2 mg/dL - - -  Alkaline Phos 38 - 126 U/L - - -  AST 15 - 41 U/L - - -  ALT 0 - 44 U/L - - -    Lab Results  Component Value Date   WBC 6.5 01/06/2021   HGB 7.4 (L) 01/06/2021   HCT 23.7 (L) 01/06/2021   MCV 96.7 01/06/2021   PLT 150 01/06/2021   NEUTROABS 6.4 12/31/2020    DG Chest 2 View  Result Date: 12/16/2020 CLINICAL DATA:  Shortness of breath in a 55 year old female. EXAM: CHEST - 2 VIEW COMPARISON:  July 10, 2009. FINDINGS: Linear opacities in the LEFT chest of progressed slightly since the previous study. No lobar consolidation. Cardiomediastinal contours and hilar structures are stable. No sign of effusion. Mildly increased density in the  subcarinal region on lateral projection. No acute skeletal process on limited assessment. IMPRESSION: Signs of scarring or atelectasis in the LEFT chest, slightly progressed since the previous study. Question developing subcarinal adenopathy, based on lateral projection. Developing lower lobe airspace disease is also a differential consideration. In a patient with pelvic masses on recent pelvic MRI would consider follow-up chest CT for further evaluation. Electronically Signed   By: Zetta Bills M.D.   On: 12/16/2020 14:13   CT CHEST W CONTRAST  Result Date: 12/17/2020 CLINICAL DATA:  Cancer of unknown primary.  Staging. EXAM: CT CHEST WITH CONTRAST TECHNIQUE: Multidetector CT imaging of the chest was performed during intravenous contrast administration. CONTRAST:  72m OMNIPAQUE IOHEXOL 350 MG/ML SOLN COMPARISON:  Chest CT dated 06/01/2007. FINDINGS: Cardiovascular: There is no cardiomegaly or pericardial effusion. Three-vessel coronary vascular calcification. Mild atherosclerotic calcification of the thoracic aorta. No aneurysmal dilatation. The origins of the great vessels of the aortic arch appear patent as visualized. There is dilatation of the main pulmonary trunk suggestive of pulmonary hypertension. Evaluation of the pulmonary arteries is limited due to respiratory motion artifact and suboptimal opacification and timing of the contrast. No large or central pulmonary artery embolus identified. Mediastinum/Nodes: No hilar or mediastinal adenopathy. The  esophagus is grossly unremarkable. Prior right hemithyroidectomy. No mediastinal fluid collection. Lungs/Pleura: Bibasilar subpleural atelectasis/scarring. There is no pleural effusion pneumothorax. The central airways are patent. Upper Abdomen: Indeterminate 3 cm left renal nodule, present on the prior CT, likely a benign etiology such as adenoma. Musculoskeletal: Degenerative changes of the spine. No acute osseous pathology. IMPRESSION: 1. No acute  intrathoracic pathology. No CT evidence of central pulmonary artery embolus. 2. Dilatation of the main pulmonary trunk suggestive of pulmonary hypertension. 3. Status post prior right hemithyroidectomy. 4. Bilateral linear atelectasis/scarring. 5. Aortic Atherosclerosis (ICD10-I70.0). Electronically Signed   By: Anner Crete M.D.   On: 12/17/2020 21:09   MR Lumbar Spine W Wo Contrast  Result Date: 12/16/2020 CLINICAL DATA:  Initial evaluation for low back pain, cancer suspected. EXAM: MRI LUMBAR SPINE WITHOUT AND WITH CONTRAST TECHNIQUE: Multiplanar and multiecho pulse sequences of the lumbar spine were obtained without and with intravenous contrast. CONTRAST:  38m GADAVIST GADOBUTROL 1 MMOL/ML IV SOLN COMPARISON:  Prior CT from earlier the same day. FINDINGS: Segmentation: Standard. Lowest well-formed disc space labeled the L5-S1 level. Alignment: 4 mm facet mediated anterolisthesis of L4 on L5. Alignment otherwise normal with preservation of the normal lumbar lordosis. Vertebrae: Diffusely abnormal appearance of the bone marrow is seen throughout the visualized lumbar spine and pelvis. Associated heterogeneous STIR hyperintensity with irregular heterogeneous postcontrast enhancement. Findings are highly suspicious for diffuse osseous metastatic disease. Involvement appears to be most pronounced within the T12 vertebral body as well as the visualized pelvis. Exact measurements of a discrete lesion is difficult given the overall infiltrative appearance of this finding. No associated pathologic fracture. No visible extra osseous extension of tumor at this time. Conus medullaris and cauda equina: Conus extends to the L1 level. Conus and cauda equina appear normal. No visible epidural or intracanalicular tumor. Paraspinal and other soft tissues: Mild edema within the subcutaneous fat of the lower back, which could be related to overall volume status. Paraspinous soft tissues demonstrate no other acute finding.  Retroperitoneal/iliac adenopathy noted. Left larger than right adnexal masses partially visualized. Findings better characterized on prior CT. Disc levels: L1-2:  Unremarkable. L2-3: Disc desiccation with mild disc bulge. Superimposed small right foraminal to extraforaminal disc protrusion (series 11, image 17). Mild facet hypertrophy. Underlying short pedicles with a degree of mild spinal stenosis. Mild bilateral L2 foraminal narrowing. L3-4: Disc desiccation with mild disc bulge. Superimposed shallow left extraforaminal disc protrusion with annular fissure (series 11, image 24). Mild facet hypertrophy. Underlying short pedicles. Mild spinal stenosis. Foramina remain patent. L4-5: 4 mm anterolisthesis. Disc desiccation with broad-based posterior pseudo disc bulge. Biforaminal annular fissures noted, left larger than right. Moderate bilateral facet arthrosis. Resultant moderate spinal stenosis. Mild to moderate bilateral L4 foraminal narrowing. L5-S1: Normal interspace. Mild right greater than left facet hypertrophy. No stenosis. IMPRESSION: 1. 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 No associated pathologic fracture or extra osseous extension of tumor at this time. 2. Retroperitoneal/iliac adenopathy with left larger than right adnexal masses, better characterized on prior CT. 3. Underlying multilevel degenerative spondylosis as above, most pronounced at L4-5 where there is resultant moderate spinal stenosis. Electronically Signed   By: BJeannine BogaM.D.   On: 12/16/2020 23:44   [place]vis Complete  Result Date: 12/16/2020 CLINICAL DATA:  Pelvic pain EXAM: TRANSABDOMINAL ULTRASOUND OF PELVIS DOPPLER ULTRASOUND OF OVARIES TECHNIQUE: Transabdominal ultrasound examination of the pelvis was performed including evaluation of  the uterus, ovaries, adnexal regions, and pelvic cul-de-sac. Color  and duplex Doppler ultrasound was utilized to evaluate blood flow to the ovaries. COMPARISON:  CT 12/16/2020, MRI 12/05/2020, pelvic ultrasound 12/01/2020 FINDINGS: Uterus Measurements: 6.8 x 3.4 x 5 cm = volume: 60.5 mL. No fibroids or other mass visualized. Endometrium Poorly visible, unable to measure. Right ovary Measurements: 4.8 x 2.9 x 5.5 cm = volume: 38.6 mL. Solid right adnexal mass replaces most of right ovary. Left ovary Measurements: 10.5 x 8.7 x 9.2 cm = volume: 440 mL. Enlarged left ovary with heterogenous solid mass that replaces the left ovary. Previously the left ovary measured 8.3 x 7.7 x 8.5 cm on MRI. Pulsed Doppler evaluation demonstrates normal arterial and venous waveforms in both or adnexa, though note that both ovaries are essentially replaced by mass lesions. Other: No significant ascites. Foley catheter within the bladder which appears somewhat thick walled. IMPRESSION: 1. Very limited exam secondary to habitus and only trans abdominal technique 2. Bilateral solid adnexal masses essentially replacing the ovaries which thereby limits assessment of the ovaries. Left adnexal solid mass lesion has significantly increased in size; documented flow in the adnexa relates to flow within the solid adnexal masses. Gynecology follow-up recommended. Electronically Signed   By: Donavan Foil M.D.   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 EXAM: CT ABDOMEN AND PELVIS WITH CONTRAST TECHNIQUE: Multidetector CT imaging of the abdomen and pelvis was performed using the standard protocol following bolus administration of intravenous contrast. CONTRAST:  38m OMNIPAQUE IOHEXOL 350 MG/ML SOLN COMPARISON:  Comparison is made with multiple prior studies which were performed recently, most recent comparison is an MRI of the pelvis of December 05, 2020.  FINDINGS: Lower chest: Basilar atelectasis. Subcarinal region not well evaluated. No consolidation or effusion at the lung bases. Hepatobiliary: No focal, suspicious hepatic lesion. No pericholecystic stranding. Portal vein is patent. No biliary duct dilation. Pancreas: Pancreas with normal contours. No signs of adjacent inflammation. Spleen: Spleen normal size and contour. Adrenals/Urinary Tract: Well-circumscribed LEFT adrenal lesion, density value of 19 Hounsfield units on previous noncontrast imaging measuring 3.2 x 2.6 cm., relative washout is indeterminate at 30%. Normal RIGHT adrenal. Normal, symmetric enhancement of bilateral kidneys without focal renal lesion. Stomach/Bowel: Small hiatal hernia. No stranding adjacent to the stomach. No sign of small bowel obstruction or acute small bowel process. The appendix is normal. Stool in various parts of the colon without signs of obstruction or adjacent stranding. Vascular/Lymphatic: Retroperitoneal adenopathy (image 50/2) 14 mm lymph node previously 11 mm along the LEFT common iliac chain. LEFT pelvic sidewall lymph node (image 75/2) 14 mm short axis, previously 13 mm. Similar size of RIGHT pelvic sidewall/external iliac lymph nodes also with little change compared to the most recent comparison imaging. Reproductive: Foley catheter in situ. Uterus and cervix not well at assess nor are the masses within the area of the cervix and urethra seen on previous MRI. Increasing size of LEFT ovary with heterogeneous appearance now measuring 10 x 8.8 cm. When assessed on the study of October 20, 2020 the ovary for reference measured 3.7 x 2.5 cm. RIGHT ovary with area of enhancement shows enlargement as well but not as pronounced as on previous imaging with 2.8 cm area of enhancement in the RIGHT ovary as the largest discrete area which is similar to the recent MRI evaluation. As  compared to the study of October 20, 2020 there is little change in the appearance of the  RIGHT ovary aside from this area though there is an increasingly nodular appearance of the ovary in this location. Mass posterior to the LEFT ovary just above the uterus measuring 4.4 x 2.8 cm has enlarged from 2.5 cm. Other: Small volume ascites. Musculoskeletal: Heterogeneous pattern associated with the spine in terms of density raises the question of metastatic disease without discrete measurable focal lesion in the spine. For example a geographic area of sclerosis on image 101/5) in the anterior L3 vertebral body does raise the question of underlying lesion with numerous additional foci, essentially nearly all levels of the spine with some degree of heterogeneity. Another example of this finding is noted on the RIGHT at the T10 level. No acute bone finding or frankly destructive bone process. IMPRESSION: Rapid increase in size of the LEFT ovary with some adjacent ascites. The rapid enlargement is seen in the setting of generalized worsening of masses associated with the LEFT and RIGHT ovary but perhaps more pronounced than other areas in the pelvis. The possibility of torsion associated with an ovarian mass could be considered based on this finding. Would correlate with any worsening abdominal pain and with sonogram as warranted for further assessment. Enlarging masses elsewhere though not to the extent that is seen with the LEFT ovary. Masslike area of the cervix and potentially within a urethral diverticulum not as well seen as on the recent MRI evaluation. Small volume ascites. Well-circumscribed LEFT adrenal lesion measuring 3.2 x 2.6 cm, density value of 19 Hounsfield units on previous noncontrast imaging. Relative washout is indeterminate at 30%. This may represent an adrenal adenoma. Consider dedicated adrenal protocol CT or attention on PET evaluation if performed. Heterogeneous pattern of subtle sclerosis and lucency in the spine on today's study raising the question of bony metastatic disease in the  spine. Findings of increased ovarian size on the LEFT out of proportion other areas were called by telephone at the time of interpretation on 12/16/2020 at 5:44 pm to provider Theodis Blaze , who verbally acknowledged these results. Electronically Signed   By: Zetta Bills M.D.   On: 12/16/2020 17:46   [place] Art/Ven Flow Abd Pelv Doppler  Result Date: 12/16/2020 CLINICAL DATA:  Pelvic pain EXAM: TRANSABDOMINAL ULTRASOUND OF PELVIS DOPPLER ULTRASOUND OF OVARIES TECHNIQUE: Transabdominal ultrasound examination of the pelvis was performed including evaluation of the uterus, ovaries, adnexal regions, and pelvic cul-de-sac. Color and duplex Doppler ultrasound was utilized to evaluate blood flow to the ovaries. COMPARISON:  CT 12/16/2020, MRI 12/05/2020, pelvic ultrasound 12/01/2020 FINDINGS: Uterus Measurements: 6.8 x 3.4 x 5 cm = volume: 60.5 mL. No fibroids or other mass visualized. Endometrium Poorly visible, unable to measure. Right ovary Measurements: 4.8 x 2.9 x 5.5 cm = volume: 38.6 mL. Solid right adnexal mass replaces most of right ovary. Left ovary Measurements: 10.5 x 8.7 x 9.2 cm = volume: 440 mL. Enlarged left ovary with heterogenous solid mass that replaces the left ovary. Previously the left ovary measured 8.3 x 7.7 x 8.5 cm on MRI. Pulsed Doppler evaluation demonstrates normal arterial and venous waveforms in both or adnexa, though note that both ovaries are essentially replaced by mass lesions. Other: No significant ascites. Foley catheter within the bladder which appears somewhat thick walled. IMPRESSION: 1. Very limited exam secondary to habitus and only trans abdominal technique 2. Bilateral solid adnexal masses essentially replacing the ovaries which thereby limits assessment of the ovaries.  Left adnexal solid mass lesion has significantly increased in size; documented flow in the adnexa relates to flow within the solid adnexal masses. Gynecology follow-up recommended. Electronically Signed   By:  Donavan Foil M.D.   On: 12/16/2020 19:23   DG CHEST PORT 1 VIEW  Result Date: 12/31/2020 CLINICAL DATA:  Chest pain EXAM: PORTABLE CHEST 1 VIEW COMPARISON:  Previous studies including the examination of 12/30/2020 FINDINGS: Transverse diameter of heart is increased. There are no signs of pulmonary edema or focal pulmonary consolidation. There are small linear densities in both mid lung fields and left lower lung fields suggesting subsegmental atelectasis with interval improvement in the left lung and possible slight worsening in the right mid lung fields. IMPRESSION: Cardiomegaly. There are no signs of pulmonary edema or focal pulmonary consolidation. There are small linear densities in the both mid and left lower lung fields suggesting subsegmental atelectasis with some improvement in the left lung. Electronically Signed   By: Elmer Picker M.D.   On: 12/31/2020 13:39   DG CHEST PORT 1 VIEW  Result Date: 12/30/2020 CLINICAL DATA:  Fever, abdominal pain EXAM: PORTABLE CHEST 1 VIEW COMPARISON:  Previous studies including the CT chest done on 12/17/2020 FINDINGS: Transverse diameter of heart is increased. There are linear densities in both parahilar regions and medial left lower lung fields suggesting scarring or subsegmental atelectasis with interval worsening in the left lower lung fields. There is no significant pleural effusion or pneumothorax. Surgical clips are seen in the right thyroid bed. IMPRESSION: Cardiomegaly. There are linear densities in both parahilar regions and left lower lung fields with interval worsening suggesting subsegmental atelectasis. There are no signs of alveolar pulmonary edema or focal pulmonary consolidation. Electronically Signed   By: Elmer Picker M.D.   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 EXAM: IMPLANTED PORT A CATH PLACEMENT WITH ULTRASOUND AND FLUOROSCOPIC GUIDANCE COMPARISON:  None. MEDICATIONS: None. ANESTHESIA/SEDATION: Moderate (conscious) sedation was employed during this procedure. A total of Versed 3 mg and Fentanyl 100 mcg was administered intravenously. Moderate Sedation Time: 19 minutes. The patient's level of consciousness and vital signs were monitored continuously by radiology nursing throughout the procedure under my direct supervision. CONTRAST:  None FLUOROSCOPY TIME:  0 minutes, 12 seconds (5 mGy) COMPLICATIONS: None immediate. PROCEDURE: The procedure, risks, benefits, and alternatives were explained to the patient. Questions regarding the procedure were encouraged and answered. The patient understands and consents to the procedure. The right neck and chest were prepped with chlorhexidine in a sterile fashion, and a sterile drape was applied covering the operative field. Maximum barrier sterile technique with sterile gowns and gloves were used for the procedure. A timeout was performed prior to the initiation of the procedure. Ultrasound was used to examine the jugular vein which was compressible and free of internal echoes. A skin marker was used to demarcate the planned venotomy and port pocket incision sites. Local anesthesia was provided to these sites and the subcutaneous tunnel track with 1% lidocaine with 1:100,000 epinephrine. A small incision was created at the jugular access site and blunt dissection was performed of the subcutaneous tissues. Under ultrasound guidance, the jugular vein was accessed with a 21 ga micropuncture needle and an 0.018" wire was inserted to the superior vena cava. Real-time ultrasound guidance was utilized for vascular access including the acquisition of a permanent ultrasound  image documenting patency of the accessed vessel. A 5 Fr micopuncture set was then used, through which a 0.035" Rosen wire was passed under  fluoroscopic guidance into the inferior vena cava. An 8 Fr dilator was then placed over the wire. A subcutaneous port pocket was then created along the upper chest wall utilizing a combination of sharp and blunt dissection. The pocket was irrigated with sterile saline, packed with gauze, and observed for hemorrhage. A single lumen "ISP" sized power injectable port was chosen for placement. The 8 Fr catheter was tunneled from the port pocket site to the venotomy incision. The port was placed in the pocket. The external catheter was trimmed to appropriate length. The dilator was exchanged for an 8 Fr peel-away sheath under fluoroscopic guidance. The catheter was then placed through the sheath and the sheath was removed. Final catheter positioning was confirmed and documented with a fluoroscopic spot radiograph. The port was accessed with a Huber needle, aspirated, and flushed with heparinized saline. The deep dermal layer of the port pocket incision was closed with interrupted 3-0 Vicryl suture. The skin was opposed with a running subcuticular 4-0 Monocryl suture. Dermabond was then placed over the port pocket and neck incisions. The patient tolerated the procedure well without immediate post procedural complication. FINDINGS: After catheter placement, the tip lies within the superior cavoatrial junction. The catheter aspirates and flushes normally and is ready for immediate use. IMPRESSION: Successful placement of a power injectable Port-A-Cath via the right internal jugular vein. The catheter is ready for immediate use. Ruthann Cancer, MD [facility] Electronically Signed   By: Ruthann Cancer M.D.   On: 12/26/2020 11:08   VAS [place] LOWER EXTREMITY VENOUS (DVT)  Result Date: 12/29/2020  Lower Venous DVT Study Patient Name:  KALEEAH GINGERICH Lykins  Date of Exam:   12/29/2020 Medical Rec #: [id number]            Accession #:    [id number] Date of Birth: 09/13/1965             Patient Gender: F Patient Age:   55 years Exam Location:  [facility] Procedure:      VAS [place] LOWER EXTREMITY VENOUS (DVT) Referring Phys: A POWELL JR --------------------------------------------------------------------------------  Indications: Pain.  Risk Factors: Cancer. Limitations: Body habitus and poor ultrasound/tissue interface. Comparison Study: No prior studies. Performing Technologist: Oliver Hum RVT  Examination Guidelines: A complete evaluation includes B-mode imaging, spectral Doppler, color Doppler, and power Doppler as needed of all accessible portions of each vessel. Bilateral testing is considered an integral part of a complete examination. Limited examinations for reoccurring indications may be performed as noted. The reflux portion of the exam is performed with the patient in reverse Trendelenburg.  +---------+---------------+---------+-----------+----------+--------------+ RIGHT    CompressibilityPhasicitySpontaneityPropertiesThrombus Aging +---------+---------------+---------+-----------+----------+--------------+ CFV      Full           Yes      Yes                                 +---------+---------------+---------+-----------+----------+--------------+ SFJ      Full                                                        +---------+---------------+---------+-----------+----------+--------------+  FV Prox  Full                                                        +---------+---------------+---------+-----------+----------+--------------+ FV Mid   Full                                                        +---------+---------------+---------+-----------+----------+--------------+ FV DistalFull                                                        +---------+---------------+---------+-----------+----------+--------------+ PFV      Full                                                         +---------+---------------+---------+-----------+----------+--------------+ POP      Full           Yes      Yes                                 +---------+---------------+---------+-----------+----------+--------------+ PTV      Full                                                        +---------+---------------+---------+-----------+----------+--------------+ PERO     Full                                                        +---------+---------------+---------+-----------+----------+--------------+   +---------+---------------+---------+-----------+----------+--------------+ LEFT     CompressibilityPhasicitySpontaneityPropertiesThrombus Aging +---------+---------------+---------+-----------+----------+--------------+ CFV      Full           Yes      Yes                                 +---------+---------------+---------+-----------+----------+--------------+ SFJ      Full                                                        +---------+---------------+---------+-----------+----------+--------------+ FV Prox  Full                                                        +---------+---------------+---------+-----------+----------+--------------+  FV Mid   Full                                                        +---------+---------------+---------+-----------+----------+--------------+ FV DistalFull                                                        +---------+---------------+---------+-----------+----------+--------------+ PFV      Full                                                        +---------+---------------+---------+-----------+----------+--------------+ POP      Full           Yes      Yes                                 +---------+---------------+---------+-----------+----------+--------------+ PTV      Full                                                         +---------+---------------+---------+-----------+----------+--------------+ PERO     Full                                                        +---------+---------------+---------+-----------+----------+--------------+     Summary: RIGHT: - There is no evidence of deep vein thrombosis in the lower extremity.  - No cystic structure found in the popliteal fossa.  LEFT: - There is no evidence of deep vein thrombosis in the lower extremity.  - No cystic structure found in the popliteal fossa.  *See table(s) above for measurements and observations. Electronically signed by Jamelle Haring on 12/29/2020 at 12:00:16 PM.    Final     ASSESSMENT AND PLAN: 1.  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 -12/05/2020 MRI of the pelvis-poorly marginated enhancing 3.7 x 2.8 x 3.0 cm mass centered at the urethra, solid avidly enhancing bilateral ovarian masses, poorly marginated enhancing 2.4 x 2.3 x 3.0 cm mass in the left uterine cervix, mild to moderate bilateral common iliac, bilateral external iliac, and bilateral inguinal lymphadenopathy, diffuse patchy confluent nodular replacement of the pelvic osseous structures. -12/13/2020 CEA 1864, CA125 29.2 -12/16/2020 CT abdomen/pelvis-rapidly increasing size of the left ovary with some adjacent ascites, enlarging masses elsewhere, masslike area of the cervix and potentially within a urethral diverticulum, well-circumscribed left adrenal lesion measuring 3.2 x 2.6 cm, heterogeneous pattern of subtle sclerosis and lucency in the spine. -12/16/2020 MRI of the lumbar spine-diffusely abnormal appearance of the bone marrow throughout the visualized lumbar spine and pelvis highly suspicious for diffuse osseous metastatic disease, retroperitoneal/iliac  adenopathy with left larger than right adnexal masses. 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  Abdominal pain secondary #1 3.  Constipation 4.  Normocytic anemia 5.  Leukocytosis 6.  Protein calorie malnutrition 7.  Obstructive sleep apnea 8.  Diabetes mellitus 9.  Hypertension 10.  Possible pulmonary hypertension noted on CT chest  Ms. Bluestone appears unchanged. [facility] report has returned which shows a 90% probability that primary malignancy is colorectal.  Today is day 12 of cycle 1 of FOLFOX. Second cycle of chemotherapy is due on 01/10/2021.  Disposition pending-May go to CIR versus SNF for rehab.  I have reached out to the PA with CIR to determine if chemotherapy can be administered on their unit.  I was referred to their rehab admission coordinator and I have left a voicemail.  Awaiting return call.  We would not recommend delaying her second cycle of chemotherapy very long as it will compromise her cancer care.  Pain is adequately controlled at this time.  Recommend continuation of current pain medications.  Awaiting final recommendations regarding disposition.  May go to CIR versus SNF.  However, next cycle of chemotherapy is due on 12/2 and unsure if chemotherapy can be administered in CIR.  If it cannot, we would recommend SNF for short-term rehab so that she can come to our office for chemotherapy.  Recommendations: 1.  Continue current pain medication. 2.  Plan for outpatient PET scan  3.  We will follow-up on molecular testing, HER2, and PD-L1 results. 4.  No need for daily CBC 5.  Okay to discharge from our standpoint.  Awaiting return call from rehab admission coordinator to determine if chemotherapy can be administered as scheduled on 12/2.  If it cannot, we would recommend  SNF placement that the patient's chemotherapy will not be delayed.  Future Appointments  Date Time Provider [place]  02/18/2021 10:45 AM [name], PA-C [facility]      LOS: 22 days   [name] 01/07/21 Ms. Dauenhauer was interviewed and examined.  Her pain and performance status appear to be in proving.  She is now at day 12 following cycle 1 FOLFOX.  The plan is to get cycle 2 FOLFOX as an inpatient on 01/10/2021 if she remains in the hospital. The tumor gene array assay returned suggestive of an intestinal primary.  The plan is to continue FOLFOX chemotherapy.  We will follow-up on results from [facility] testing to decide on additional treatment options.  We will begin weaning the OxyContin if the pain continues to improve.  I was present for greater than 50% of today's visit.  I performed medical decision making.

## 2021-01-07 NOTE — Progress Notes (Signed)
Nutrition Follow-up  DOCUMENTATION CODES:   Non-severe (moderate) malnutrition in context of acute illness/injury, Morbid obesity  INTERVENTION:   -Ensure Enlive po TID, each supplement provides 350 kcal and 20 grams of protein    NUTRITION DIAGNOSIS:   Moderate Malnutrition related to acute illness as evidenced by percent weight loss, moderate muscle depletion, energy intake < 75% for > 7 days.  Ongoing.  GOAL:   Patient will meet greater than or equal to 90% of their needs  Progressing  MONITOR:   PO intake, Supplement acceptance, Labs, Weight trends, I & O's  ASSESSMENT:   55 y.o. female with PMH OSA on CPAP, HTN, DMII, morbid obesity who presented with worsening abdominal pain and left thigh pain.She has been undergoing workup for recently discovered cervical/urethral masses on CT in Sept 2022.  11/8 - cytoscopy 11/13 - polypectomy and biopsy; EGD; colonoscopy 11/17 - jugular port placement   Patient currently consuming 25-75% of meals. Drinking at least 1 Ensure daily. Pt to continue chemotherapy 12/2.  Admission weight: 279 lbs No new  weights for this admission.  Medications: Miralax, Senokot  Labs reviewed: CBGs: 109-131  Diet Order:   Diet Order             Diet regular Room service appropriate? Yes; Fluid consistency: Thin  Diet effective now                   EDUCATION NEEDS:   No education needs have been identified at this time  Skin:  Skin Assessment: Skin Integrity Issues: Skin Integrity Issues:: Incisions Incisions: Vagina, closed (11/8)  Last BM:  11/25 -type 6  Height:   Ht Readings from Last 1 Encounters:  12/22/20 5\' 4"  (1.626 m)    Weight:   Wt Readings from Last 1 Encounters:  12/22/20 127 kg     BMI:  Body mass index is 48.06 kg/m.  Estimated Nutritional Needs:   Kcal:  2150-2350  Protein:  85-100g  Fluid:  2.1L/day   Clayton Bibles, MS, RD, LDN Inpatient Clinical Dietitian Contact information  available via Amion

## 2021-01-07 NOTE — Progress Notes (Signed)
PHYSICAL THERAPY  Attempted o see wice Am pt eating asked that I return this afternoon Pm pt sleeping Pt is progressing with her mobility amb to and from bathroom with staff.  Will continue to follow. Per chart review, pt is unable to D/C to CIR.  Will update LPT and re eval.  Rica Koyanagi  PTA Acute  Rehabilitation Services Pager      [phone] Office      [phone]

## 2021-01-07 NOTE — Progress Notes (Signed)
PROGRESS NOTE    Erin [name]  JJH:[id number] DOB: [birth year] DOA: 12/16/2020 PCP: Pcp, No   Brief Narrative:  This 55 years old female with PMH significant for OSA on CPAP, HTN, DMII, morbid obesity who presented with worsening abdominal pain and left thigh pain.  She has been undergoing workup for recently discovered cervical/urethral masses on CT in Sept 2022.  Further imaging showed ongoing interval enlargement of the left adnexal area.  There was also associated retroperitoneal and pelvic adenopathy. MRI pelvis also performed on 12/05/2020 showed poorly enhancing mass centered at the urethra, solid enhancing bilateral ovarian masses, poorly enhancing mass involving left uterine cervix, and significant adenopathy involving bilateral common iliac, bilateral external iliac, bilateral inguinal lymph nodes.  Also noted to have pelvic osseous lesions concerning for metastatic disease as well. Patient underwent cystoscopy with ureteral and bladder biopsy on 12/17/2020 she also underwent cervical biopsies following this procedure.  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 PT recommended acute CIR. Patient need to continue chemotherapy which cannot be completed in CIR. TOC is working on finding SNF.  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 MRI showed poorly marginated enhancing 3.7 x 2.8 x 3.0 cm mass centered at urethra concerning  for malignancy -- solid avidly enhancing bilateral ovarian masses, largest 2.3 cm on Rt and 8.3 cm on Lt concerning for bilateral ovarian mets -- poorly marginated enhancing 2.4x2.3x3.0 cm mass in L uterine cervix, concerning for malignancy -- bilateral common iliac, bilateral external iliac, and bilateral inguinal LAD, concerning for metastatic disease -- diffuse patchy confluent nodular replacement of the pelvic osseus structures, concerning for osseus metastatic disease (see report) - Biopsy taken during cystoscopy on 12/17/2020 - biopsy has returned on 11/11. 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  gastric nodule bx and notable for adenocarcinoma, poorly differentiated with signet ring morphology.  Focal invastion of muscularis mucosa.  Muscularis propia not present in bx.  Per path reports, this most likely represents primary source of metastatic carcinoma dx on bx of vagina on 11/8. - Oncology following, now s/p port placement 11/17 - Appreciate gyn onc assistance    Suspected metastatic gastric cancer : Oncology awaiting molecular testing to determine treatment options. Cycle 1 folfox started ON 11/18 per oncology, had planned to discharge to CIR on Monday after chemo complete. Patient need to continue chemotherapy which cannot be completed in CIR. TOC is working on finding SNF.   Fever: > Resolved. Patient has spiked fever could be related to malignancy. Hold abx for now, consider starting if recurrent or spike > 101 F 1 set of cultures from 11/21 with gram positive cocci with staph species (suspect contaminant) Repeat blood cultures NGTD  Urine culture contaminated. Repeat CXR with subsegmental  atelectasis.  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  - gastric nodule bx and notable for adenocarcinoma, poorly differentiated with signet ring morphology.  Focal invastion of muscularis mucosa.  Muscularis propia not present in bx.  Per path reports, this most likely represents primary source of metastatic carcinoma dx on bx of vagina on 11/8. - Onco recommended Outpatient PET study as outpatient   Normochromic normocytic anemia: Could be secondary to malignancy. Continue to monitor H&H. Transfuse 1 unit of PRBC.  Posttransfusion hemoglobin 7.6>7.0>7.1>7.4   Chronic pain syndrome: Continue long-acting oxycontin 40 mg twice daily. Reduce short acting oxycodone to 10 mg every 6 as needed Palliative care consulted for pain management. LE [place] for LE pain negative.  Trial flexeril.  Increase gabapentin.   Panic attacks: Continue benzo as needed  Gastritis: EGD with gastritis PPI x 6 weeks then 40 mg daily Colonoscopy with 5 polyps removed (1 in ascending and 4 in sigmoid) bx as noted above   Ovarian mass: Noted on MRI pelvis Workup as noted above.   Acute lower UTI-resolved as of 12/24/2020 S/p ceftriaxone Culture with strep viridans resolved      Malnutrition of moderate degree - Moderate malnutrition in context of acute illness/injury, morbid obesity - Evaluated by dietitian, appreciate assistance - per RD: "Moderate Malnutrition related to acute illness as evidenced by percent weight loss, moderate muscle depletion, energy intake < 75% for > 7 days."    OSA (obstructive sleep apnea) Continue CPAP.   Diabetes mellitus ([place]) Continue SSI   Essential hypertension Continue amlodipine, coreg    DVT prophylaxis: Lovenox Code Status: Full code Family Communication: No family at bedside. Disposition Plan:   Status is:  Inpatient  Remains inpatient appropriate because: Patient is medically clear.   Yes.  Awaiting CIR.  Patient need to continue chemotherapy which cannot be completed in CIR. TOC is working on finding SNF.  Patient is medically clear for discharge : Yes  Consultants:  Oncology IR Urology GI Palliative care D1 oncology  Procedures:  Diagnostic cystoscopy, bladder biopsy, urethral biopsy, 12/17/20 Cervical biopsies, 12/17/2020 EGD and colonoscopy 12/22/20 Port placement by IR 11/17   Antimicrobials:  Anti-infectives (From admission, onward)    Start     Dose/Rate Route Frequency Ordered Stop   12/23/20 1000  cefTRIAXone (ROCEPHIN) 1 g in sodium chloride 0.9 % 100 mL IVPB        1 g 200 mL/hr over 30 Minutes Intravenous Every 24 hours 12/23/20 0746 12/24/20 1030   12/20/20 1000  cefTRIAXone (ROCEPHIN) 1 g in sodium chloride 0.9 % 100 mL IVPB        1 g 200 mL/hr over 30 Minutes Intravenous Every 24 hours 12/20/20 0752 12/22/20 1451   12/17/20 0930  ceFAZolin (ANCEF) 3 g in dextrose 5 % 50 mL IVPB  Status:  Discontinued        3 g 100 mL/hr over 30 Minutes Intravenous  Once 12/17/20 0925 12/17/20 0926   12/17/20 0915  ceFAZolin (ANCEF) IVPB 3g/100 mL premix        3 g 200 mL/hr over 30 Minutes Intravenous  Once 12/17/20 0914 12/17/20 0935        Subjective: Patient was seen and examined at bedside.  Overnight events noted.   Patient reports feeling better/ slept better last night.  Sitting on the side of the bed getting breakfast.  She is awaiting bed placement in CIR  Objective: Vitals:   01/06/21 2024 01/07/21 0432 01/07/21 0432 01/07/21 1249  BP: 137/81 127/75 127/75   Pulse: (!) 102 93 93   Resp: 16 18 18 20   Temp: 98.5 F (36.9 C) 98.2 F (36.8 C) 98.2 F (36.8 C)   TempSrc: Oral Oral Oral   SpO2: 96% 96% 97%   Weight:      Height:        Intake/Output Summary (Last 24 hours) at 01/07/2021 1409 Last data filed at 01/07/2021 0645 Gross per 24 hour   Intake 120 ml  Output 1500 ml  Net -1380 ml   Filed Weights   12/16/20 1307 12/17/20 0841 12/22/20 0928  Weight: 127 kg 127 kg 127 kg    Examination:  General exam: Comfortable.  Deconditioned, not in any distress,  Respiratory system: Clear to auscultation B/L. Respiratory effort normal. RR 15 Cardiovascular system: S1-S2 heard, regular rate and rhythm, no murmur. Gastrointestinal system: Abdomen is soft, mildly distended, nontender, BS +. Central nervous system: Alert and oriented x 3 . No focal neurological deficits. Extremities: Edema 1+, no cyanosis, no clubbing. Skin: No rashes, lesions or ulcers Psychiatry: Judgement and insight appear normal. Mood & affect appropriate.     Data Reviewed: I have personally reviewed following labs and imaging studies  CBC: Recent Labs  Lab 01/01/21 0830 01/02/21 0550 01/04/21 0500 01/05/21 0658 01/06/21 0625  WBC 6.8 6.1 5.8 6.4 6.5  HGB 7.6* 7.0* 7.1* 7.4* 7.4*  HCT 24.5* 22.7* 22.8* 23.3* 23.7*  MCV 94.2 95.8 95.4 95.1 96.7  PLT 177 150 147* 152 109   Basic Metabolic Panel: Recent Labs  Lab 01/02/21 0550 01/03/21 0620 01/04/21 0500  NA 135 138 137  K 4.1 4.0 3.8  CL 104 107 104  CO2 24 25 25   GLUCOSE 123* 108* 122*  BUN 22* 17 14  CREATININE 0.72 0.61 0.68  CALCIUM 8.8* 9.0 9.1   GFR: Estimated Creatinine Clearance: 106.1 mL/min (by C-G formula based on SCr of 0.68 mg/dL). Liver Function Tests: No results for input(s): AST, ALT, ALKPHOS, BILITOT, PROT, ALBUMIN in the last 168 hours.  No results for input(s): LIPASE, AMYLASE in the last 168 hours. No results for input(s): AMMONIA in the last 168 hours. Coagulation Profile: No results for input(s): INR, PROTIME in the last 168 hours.  Cardiac Enzymes: No results for input(s): CKTOTAL, CKMB, CKMBINDEX, TROPONINI in the last 168 hours. BNP (last 3 results) No results for input(s): PROBNP in the last 8760 hours. HbA1C: No results for input(s): HGBA1C in the  last 72 hours. CBG: Recent Labs  Lab 01/06/21 1241 01/06/21 1630 01/06/21 2026 01/07/21 0734 01/07/21 1239  GLUCAP 115* 121* 121* 114* 131*   Lipid Profile: No results for input(s): CHOL, HDL, LDLCALC, TRIG, CHOLHDL, LDLDIRECT in the last 72 hours. Thyroid Function Tests: No results for input(s): TSH, T4TOTAL, FREET4, T3FREE, THYROIDAB in the last 72 hours. Anemia Panel: No results for input(s): VITAMINB12, FOLATE, FERRITIN, TIBC, IRON, RETICCTPCT in the last 72 hours. Sepsis Labs: No results for input(s): PROCALCITON, LATICACIDVEN in the last 168 hours.  Recent Results (from the past 240 hour(s))  Culture, blood (routine x 2)     Status: Abnormal   Collection Time: 12/30/20 10:44 AM   Specimen: BLOOD  Result Value Ref Range Status   Specimen Description   Final    BLOOD LEFT ANTECUBITAL Performed at [facility] [address], [place], [place] [address]  Special Requests   Final    BOTTLES DRAWN AEROBIC AND ANAEROBIC Blood Culture adequate volume Performed at [facility] [address], [place], [place] [address]    Culture  Setup Time   Final    GRAM POSITIVE COCCI AEROBIC BOTTLE ONLY CRITICAL RESULT CALLED TO, READ BACK BY AND VERIFIED WITH: PHARMD J GADHIA [id number] AT 61 AM BY CM    Culture (A)  Final    STAPHYLOCOCCUS HOMINIS THE SIGNIFICANCE OF ISOLATING THIS ORGANISM FROM A SINGLE SET OF BLOOD CULTURES WHEN MULTIPLE SETS ARE DRAWN IS UNCERTAIN. PLEASE NOTIFY THE MICROBIOLOGY DEPARTMENT WITHIN ONE WEEK IF SPECIATION AND SENSITIVITIES ARE REQUIRED. Performed at [facility], [place] [address], [place], [place] [address]    Report Status 01/01/2021 FINAL  Final  Blood Culture ID Panel (Reflexed)     Status: Abnormal   Collection Time: 12/30/20 10:44 AM  Result Value Ref Range Status   Enterococcus faecalis NOT DETECTED NOT DETECTED Final   Enterococcus Faecium NOT DETECTED NOT DETECTED Final   Listeria monocytogenes  NOT DETECTED NOT DETECTED Final   Staphylococcus species DETECTED (A) NOT DETECTED Final    Comment: CRITICAL RESULT CALLED TO, READ BACK BY AND VERIFIED WITH: PHARMD J GADHIA [id number] AT 844 AM BY CM    Staphylococcus aureus (BCID) NOT DETECTED NOT DETECTED Final   Staphylococcus epidermidis NOT DETECTED NOT DETECTED Final   Staphylococcus lugdunensis NOT DETECTED NOT DETECTED Final   Streptococcus species NOT DETECTED NOT DETECTED Final   Streptococcus agalactiae NOT DETECTED NOT DETECTED Final   Streptococcus pneumoniae NOT DETECTED NOT DETECTED Final   Streptococcus pyogenes NOT DETECTED NOT DETECTED Final   A.calcoaceticus-baumannii NOT DETECTED NOT DETECTED Final   Bacteroides fragilis NOT DETECTED NOT DETECTED Final   Enterobacterales NOT DETECTED NOT DETECTED Final   Enterobacter cloacae complex NOT DETECTED NOT DETECTED Final   Escherichia coli NOT DETECTED NOT DETECTED Final   Klebsiella aerogenes NOT DETECTED NOT DETECTED Final   Klebsiella oxytoca NOT DETECTED NOT DETECTED Final   Klebsiella pneumoniae NOT DETECTED NOT DETECTED Final   Proteus species NOT DETECTED NOT DETECTED Final   Salmonella species NOT DETECTED NOT DETECTED Final   Serratia marcescens NOT DETECTED NOT DETECTED Final   Haemophilus influenzae NOT DETECTED NOT DETECTED Final   Neisseria meningitidis NOT DETECTED NOT DETECTED Final   Pseudomonas aeruginosa NOT DETECTED NOT DETECTED Final   Stenotrophomonas maltophilia NOT DETECTED NOT DETECTED Final   Candida albicans NOT DETECTED NOT DETECTED Final   Candida auris NOT DETECTED NOT DETECTED Final   Candida glabrata NOT DETECTED NOT DETECTED Final   Candida krusei NOT DETECTED NOT DETECTED Final   Candida parapsilosis NOT DETECTED NOT DETECTED Final   Candida tropicalis NOT DETECTED NOT DETECTED Final   Cryptococcus neoformans/gattii NOT DETECTED NOT DETECTED Final    Comment: Performed at [facility], [place] [address], [place], [place] [address]   Culture, blood (routine x 2)     Status: None   Collection Time: 12/30/20 10:49 AM   Specimen: BLOOD  Result Value Ref Range Status   Specimen Description   Final    BLOOD BLOOD RIGHT FOREARM Performed at [facility] [address], [place], [place] [address]    Special Requests   Final    BOTTLES DRAWN AEROBIC AND ANAEROBIC Blood Culture adequate volume Performed at [facility] [address] [place], [place] [address]    Culture   Final  NO GROWTH 5 DAYS Performed at [facility], [place] [address], [place], [place] [address]    Report Status 01/04/2021 FINAL  Final  Urine Culture     Status: Abnormal   Collection Time: 12/30/20 12:31 PM   Specimen: Urine, Clean Catch  Result Value Ref Range Status   Specimen Description   Final    URINE, CLEAN CATCH Performed at [facility], [place] [address], [place], [place] [address]    Special Requests   Final    NONE Performed at [facility], [place] [address], [place], [place] [address]    Culture MULTIPLE SPECIES PRESENT, SUGGEST RECOLLECTION (A)  Final   Report Status 01/01/2021 FINAL  Final  Culture, blood (routine x 2)     Status: None   Collection Time: 12/31/20 12:39 PM   Specimen: Left Antecubital; Blood  Result Value Ref Range Status   Specimen Description   Final    LEFT ANTECUBITAL Performed at [facility] [address] [place], [place] [address]    Special Requests   Final    BOTTLES DRAWN AEROBIC AND ANAEROBIC Blood Culture adequate volume Performed at [facility] [address], [place], [place] [address]    Culture   Final    NO GROWTH 5 DAYS Performed at [facility], [place] [address], [place], [place] [address]    Report Status 01/05/2021 FINAL  Final  Culture, blood (routine x 2)     Status: None   Collection Time: 12/31/20 12:45 PM   Specimen: BLOOD LEFT HAND  Result Value Ref  Range Status   Specimen Description   Final    BLOOD LEFT HAND Performed at [facility] [address] [place], [place] [address]    Special Requests   Final    BOTTLES DRAWN AEROBIC ONLY Blood Culture adequate volume Performed at [facility] [address] [place], [place] [address]    Culture   Final    NO GROWTH 5 DAYS Performed at [facility], [place] [address], [place],  [address]    Report Status 01/05/2021 FINAL  Final    Radiology Studies: No results found.  Scheduled Meds:  amLODipine  5 mg Oral Daily   carvedilol  6.25 mg Oral BID WC   Chlorhexidine Gluconate Cloth  6 each Topical Daily   enoxaparin (LOVENOX) injection  60 mg Subcutaneous Q24H   feeding supplement  237 mL Oral TID BM   gabapentin  300 mg Oral QHS   insulin aspart  0-9 Units Subcutaneous TID WC   oxyCODONE  40 mg Oral Q12H   pantoprazole  40 mg Oral BID   polyethylene glycol  17 g Oral Daily   senna-docusate  1 tablet Oral BID   sodium chloride flush  10-40 mL Intracatheter Q12H   Continuous Infusions:   LOS: 22 days    Time spent: 25 mins    Shawna Clamp, MD [facility]   If 7PM-7AM, please contact night-coverage

## 2021-01-08 LAB — CBC
HCT: 22.1 % — ABNORMAL LOW (ref 36.0–46.0)
Hemoglobin: 6.8 g/dL — CL (ref 12.0–15.0)
MCH: 29.7 pg (ref 26.0–34.0)
MCHC: 30.8 g/dL (ref 30.0–36.0)
MCV: 96.5 fL (ref 80.0–100.0)
Platelets: 186 10*3/uL (ref 150–400)
RBC: 2.29 MIL/uL — ABNORMAL LOW (ref 3.87–5.11)
RDW: 15.6 % — ABNORMAL HIGH (ref 11.5–15.5)
WBC: 6.4 10*3/uL (ref 4.0–10.5)
nRBC: 2.2 % — ABNORMAL HIGH (ref 0.0–0.2)

## 2021-01-08 LAB — BASIC METABOLIC PANEL
Anion gap: 7 (ref 5–15)
BUN: 16 mg/dL (ref 6–20)
CO2: 25 mmol/L (ref 22–32)
Calcium: 9.2 mg/dL (ref 8.9–10.3)
Chloride: 106 mmol/L (ref 98–111)
Creatinine, Ser: 0.77 mg/dL (ref 0.44–1.00)
GFR, Estimated: >60 mL/min (ref >60)
Glucose, Bld: 111 mg/dL — ABNORMAL HIGH (ref 70–99)
Potassium: 3.6 mmol/L (ref 3.5–5.1)
Sodium: 138 mmol/L (ref 135–145)

## 2021-01-08 LAB — GLUCOSE, CAPILLARY
Glucose-Capillary: 102 mg/dL — ABNORMAL HIGH (ref 70–99)
Glucose-Capillary: 153 mg/dL — ABNORMAL HIGH (ref 70–99)
Glucose-Capillary: 118 mg/dL — ABNORMAL HIGH (ref 70–99)
Glucose-Capillary: 102 mg/dL — ABNORMAL HIGH (ref 70–99)

## 2021-01-08 LAB — MAGNESIUM: Magnesium: 2.1 mg/dL (ref 1.7–2.4)

## 2021-01-08 LAB — PHOSPHORUS: Phosphorus: 3.4 mg/dL (ref 2.5–4.6)

## 2021-01-08 LAB — PREPARE RBC (CROSSMATCH)

## 2021-01-08 LAB — HEMOGLOBIN AND HEMATOCRIT, BLOOD
HCT: 22.1 % — ABNORMAL LOW (ref 36.0–46.0)
Hemoglobin: 6.8 g/dL — CL (ref 12.0–15.0)

## 2021-01-08 MED ORDER — SODIUM CHLORIDE 0.9% IV SOLUTION
Freq: Once | INTRAVENOUS | Status: AC
  Administered 2021-01-08: 250 mL via INTRAVENOUS

## 2021-01-08 NOTE — Progress Notes (Signed)
PT Cancellation Note  Patient Details Name: Erin [name] MRN: [id number] DOB: Jun 26, 1965   Cancelled Treatment:    Reason Eval/Treat Not Completed: Pain limiting ability to participate   Claretha Cooper 01/08/2021, 4:32 PM [place] Pager [phone] Office [phone]

## 2021-01-08 NOTE — Progress Notes (Signed)
PROGRESS NOTE    Erin [name]  UMP:[id number] DOB: Nov 19, 1965 DOA: 12/16/2020 PCP: Pcp, No   Brief Narrative:  This 55 years old female with PMH significant for OSA on CPAP, HTN, DMII, recent diagnosis of ovarian anterolateral mass, morbid obesity who presented to [facility] ED with lower abdominal pain and lower extremity pain.  She has been followed by Dr. Berline Lopes with gynecology oncology for bilateral ovarian masses and endometrial mass.  Was prescribed oxycodone by gynecology oncology for her pain but it was not fully effective so she presented to the ED for further evaluation.  Work-up revealed rapid increase in the size of the left ovary with some adjacent ascites, masslike area of the cervix and potentially within the urethral diverticulum seen on CT abdomen and pelvis, not seen on prior MRI.  Left adrenal lesion measuring 3.2 x 2.6 cm, lucency in the spine concerning for metastatic disease.  MRI of the lumbar spine 12/16/2020 showed diffusely abnormal appearance of the marrow highly suspicious for diffuse osseous metastatic disease.  Retroperitoneal/iliac adenopathy with left larger than right adnexal masses.  Was taken to the OR on 12/17/2020 for EUA and cervical biopsies as well as diagnostic cystoscopy, bladder biopsy, endometrial biopsy.  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  Ongoing chemotherapy.  Plan to continue chemotherapy.  Next chemotherapy on 01/10/2021.  Hospital course complicated by drop in hemoglobin 6.8 K on 12/29/2020, 2 units PRBCs ordered to be transfused with patient's verbal consent.  01/08/2021: Patient seen and examined at bedside.  Pain is better controlled.  Weak appearing on exam.  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 MRI showed poorly marginated enhancing 3.7 x 2.8 x 3.0 cm mass centered at urethra concerning for malignancy -- solid avidly enhancing bilateral ovarian masses, largest 2.3 cm on Rt and 8.3 cm on Lt concerning for bilateral ovarian mets -- poorly marginated enhancing 2.4x2.3x3.0 cm mass in L uterine cervix, concerning for malignancy -- bilateral common iliac, bilateral external iliac, and bilateral inguinal LAD, concerning for metastatic disease -- diffuse patchy confluent nodular replacement of the pelvic osseus structures, concerning for osseus metastatic disease (see report) - Biopsy taken during cystoscopy on 12/17/2020 - biopsy has returned on 11/11. 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  gastric nodule bx and notable for adenocarcinoma, poorly differentiated with signet ring morphology.  Focal invastion of muscularis mucosa.  Muscularis propia not present in bx.  Per path reports, this most likely represents primary source of metastatic carcinoma dx on bx of vagina on 11/8. - Oncology following, now s/p port placement 11/17 - Appreciate gyn onc assistance    Suspected metastatic gastric cancer : Oncology  awaiting molecular testing to determine treatment options. Cycle 1 folfox started ON 11/18 per oncology, had planned to discharge to CIR on Monday  after chemo complete. Patient need to continue chemotherapy which cannot be completed in CIR. TOC is working on finding SNF.   Acute blood loss anemia in the setting of chemotherapy Hemoglobin dropped this morning 6.8, 2 units PRBC ordered to be transfused on 01/08/2021.  Repeat CBC posttransfusion. Transfused 1 unit PRBC previously during this admission.  Fever: > Resolved. Patient has spiked fever could be related to malignancy. Hold abx for now, consider starting if recurrent or spike > 101 F 1 set of cultures from 11/21 with gram positive cocci with staph species (suspect contaminant) Repeat blood cultures NGTD Urine culture contaminated. Repeat CXR with subsegmental atelectasis.  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  - gastric nodule bx and notable for adenocarcinoma, poorly differentiated with signet ring morphology.  Focal invastion of muscularis mucosa.  Muscularis propia not present in bx.  Per path reports, this most likely represents primary source of metastatic carcinoma dx on bx of vagina on 11/8. - Onco recommended Outpatient PET study as outpatient   Chronic pain syndrome: Continue long-acting oxycontin 40 mg twice daily. Reduce short acting oxycodone to 10 mg every 6 as needed Palliative care consulted for pain management. LE [place] for LE pain negative.  Trial flexeril.  Increase gabapentin. Continue pain control as needed   Panic attacks: Continue benzo as needed  Gastritis: EGD with gastritis PPI x 6 weeks then 40 mg daily Colonoscopy with 5 polyps removed (1 in ascending and 4 in sigmoid) bx as noted above   Ovarian mass: Noted on MRI pelvis Workup as noted above.   Acute lower UTI-resolved as of 12/24/2020 S/p ceftriaxone Culture with strep viridans resolved    Malnutrition of moderate degree - Moderate malnutrition in  context of acute illness/injury, morbid obesity - Evaluated by dietitian, appreciate assistance - per RD: "Moderate Malnutrition related to acute illness as evidenced by percent weight loss, moderate muscle depletion, energy intake < 75% for > 7 days."    OSA (obstructive sleep apnea) Continue CPAP.   Diabetes mellitus ([place]) Continue SSI   Essential hypertension BP is at goal Continue home regimen Continue amlodipine, coreg  Physical debility/ambulatory dysfunction Per CIR cannot do chemoinfusion on CIR. Continue PT OT with assistance and fall precautions     DVT prophylaxis: Lovenox subcu daily Code Status: Full code Family Communication: No family at bedside. Disposition Plan:   Status is: Inpatient  Remains inpatient appropriate because: Patient is medically clear.   Yes.  Awaiting CIR.  Patient need to continue chemotherapy which cannot be completed in CIR. TOC is working on finding SNF.  Patient is medically clear for discharge : Yes  Consultants:  Oncology IR Urology GI Palliative care D1 oncology  Procedures:  Diagnostic cystoscopy, bladder biopsy, urethral biopsy, 12/17/20 Cervical biopsies, 12/17/2020 EGD and colonoscopy 12/22/20 Port placement by IR 11/17   Antimicrobials:  Anti-infectives (From admission, onward)    Start     Dose/Rate Route Frequency Ordered Stop   12/23/20 1000  cefTRIAXone (ROCEPHIN) 1 g in sodium chloride 0.9 % 100 mL IVPB        1 g 200 mL/hr over 30 Minutes Intravenous Every 24 hours 12/23/20 0746 12/24/20 1030   12/20/20 1000  cefTRIAXone (ROCEPHIN) 1 g in sodium chloride 0.9 % 100 mL IVPB  1 g 200 mL/hr over 30 Minutes Intravenous Every 24 hours 12/20/20 0752 12/22/20 1451   12/17/20 0930  ceFAZolin (ANCEF) 3 g in dextrose 5 % 50 mL IVPB  Status:  Discontinued        3 g 100 mL/hr over 30 Minutes Intravenous  Once 12/17/20 0925 12/17/20 0926   12/17/20 0915  ceFAZolin (ANCEF) IVPB 3g/100 mL premix        3 g 200  mL/hr over 30 Minutes Intravenous  Once 12/17/20 0914 12/17/20 0935        Objective: Vitals:   01/07/21 1249 01/07/21 1334 01/07/21 2000 01/08/21 0405  BP:   (!) 151/74 127/79  Pulse:   (!) 101 92  Resp: 20 18 18 20   Temp:   98.1 F (36.7 C) 98.3 F (36.8 C)  TempSrc:   Oral Oral  SpO2:   97% 97%  Weight:      Height:        Intake/Output Summary (Last 24 hours) at 01/08/2021 1044 Last data filed at 01/08/2021 0523 Gross per 24 hour  Intake 240 ml  Output 850 ml  Net -610 ml   Filed Weights   12/16/20 1307 12/17/20 0841 12/22/20 0928  Weight: 127 kg 127 kg 127 kg    Examination:  General exam: Appears well-developed well-nourished in no acute distress.  She is alert and oriented x3. Respiratory system: Clear to auscultation with no wheezes or rales.   Cardiovascular system: Regular rate and rhythm no rubs or gallops.  No JVD or thyromegaly noted.   Gastrointestinal system: Appears soft nondistended bowel sounds present.   Central nervous system: Alert and oriented x3.  Nonfocal exam.  Extremities: Trace edema in lower extremities bilaterally.   Skin: No rashes or ulcerative lesions. Psychiatry: Mood is appropriate for condition and setting.   Data Reviewed: I have personally reviewed following labs and imaging studies  CBC: Recent Labs  Lab 01/02/21 0550 01/04/21 0500 01/05/21 0658 01/06/21 0625 01/08/21 0451 01/08/21 0830  WBC 6.1 5.8 6.4 6.5 6.4  --   HGB 7.0* 7.1* 7.4* 7.4* 6.8* 6.8*  HCT 22.7* 22.8* 23.3* 23.7* 22.1* 22.1*  MCV 95.8 95.4 95.1 96.7 96.5  --   PLT 150 147* 152 150 186  --    Basic Metabolic Panel: Recent Labs  Lab 01/02/21 0550 01/03/21 0620 01/04/21 0500 01/08/21 0451  NA 135 138 137 138  K 4.1 4.0 3.8 3.6  CL 104 107 104 106  CO2 24 25 25 25   GLUCOSE 123* 108* 122* 111*  BUN 22* 17 14 16   CREATININE 0.72 0.61 0.68 0.77  CALCIUM 8.8* 9.0 9.1 9.2  MG  --   --   --  2.1  PHOS  --   --   --  3.4   GFR: Estimated  Creatinine Clearance: 106.1 mL/min (by C-G formula based on SCr of 0.77 mg/dL). Liver Function Tests: No results for input(s): AST, ALT, ALKPHOS, BILITOT, PROT, ALBUMIN in the last 168 hours.  No results for input(s): LIPASE, AMYLASE in the last 168 hours. No results for input(s): AMMONIA in the last 168 hours. Coagulation Profile: No results for input(s): INR, PROTIME in the last 168 hours.  Cardiac Enzymes: No results for input(s): CKTOTAL, CKMB, CKMBINDEX, TROPONINI in the last 168 hours. BNP (last 3 results) No results for input(s): PROBNP in the last 8760 hours. HbA1C: No results for input(s): HGBA1C in the last 72 hours. CBG: Recent Labs  Lab 01/07/21 0734 01/07/21 1239 01/07/21  [place] 01/07/21 2007 01/08/21 0733  GLUCAP 114* 131* 132* 124* 102*   Lipid Profile: No results for input(s): CHOL, HDL, LDLCALC, TRIG, CHOLHDL, LDLDIRECT in the last 72 hours. Thyroid Function Tests: No results for input(s): TSH, T4TOTAL, FREET4, T3FREE, THYROIDAB in the last 72 hours. Anemia Panel: No results for input(s): VITAMINB12, FOLATE, FERRITIN, TIBC, IRON, RETICCTPCT in the last 72 hours. Sepsis Labs: No results for input(s): PROCALCITON, LATICACIDVEN in the last 168 hours.  Recent Results (from the past 240 hour(s))  Culture, blood (routine x 2)     Status: Abnormal   Collection Time: 12/30/20 10:44 AM   Specimen: BLOOD  Result Value Ref Range Status   Specimen Description   Final    BLOOD LEFT ANTECUBITAL Performed at [place] [address] [place], [place] [address]    Special Requests   Final    BOTTLES DRAWN AEROBIC AND ANAEROBIC Blood Culture adequate volume Performed at [facility] [address], [place], [place] [address]    Culture  Setup Time   Final    GRAM POSITIVE COCCI AEROBIC BOTTLE ONLY CRITICAL RESULT CALLED TO, READ BACK BY AND VERIFIED WITH: PHARMD J GADHIA [id number] AT 63 AM BY CM    Culture (A)  Final     STAPHYLOCOCCUS HOMINIS THE SIGNIFICANCE OF ISOLATING THIS ORGANISM FROM A SINGLE SET OF BLOOD CULTURES WHEN MULTIPLE SETS ARE DRAWN IS UNCERTAIN. PLEASE NOTIFY THE MICROBIOLOGY DEPARTMENT WITHIN ONE WEEK IF SPECIATION AND SENSITIVITIES ARE REQUIRED. Performed at [facility], [place] [address], [place], [place] [address]    Report Status 01/01/2021 FINAL  Final  Blood Culture ID Panel (Reflexed)     Status: Abnormal   Collection Time: 12/30/20 10:44 AM  Result Value Ref Range Status   Enterococcus faecalis NOT DETECTED NOT DETECTED Final   Enterococcus Faecium NOT DETECTED NOT DETECTED Final   Listeria monocytogenes NOT DETECTED NOT DETECTED Final   Staphylococcus species DETECTED (A) NOT DETECTED Final    Comment: CRITICAL RESULT CALLED TO, READ BACK BY AND VERIFIED WITH: PHARMD J GADHIA [id number] AT 844 AM BY CM    Staphylococcus aureus (BCID) NOT DETECTED NOT DETECTED Final   Staphylococcus epidermidis NOT DETECTED NOT DETECTED Final   Staphylococcus lugdunensis NOT DETECTED NOT DETECTED Final   Streptococcus species NOT DETECTED NOT DETECTED Final   Streptococcus agalactiae NOT DETECTED NOT DETECTED Final   Streptococcus pneumoniae NOT DETECTED NOT DETECTED Final   Streptococcus pyogenes NOT DETECTED NOT DETECTED Final   A.calcoaceticus-baumannii NOT DETECTED NOT DETECTED Final   Bacteroides fragilis NOT DETECTED NOT DETECTED Final   Enterobacterales NOT DETECTED NOT DETECTED Final   Enterobacter cloacae complex NOT DETECTED NOT DETECTED Final   Escherichia coli NOT DETECTED NOT DETECTED Final   Klebsiella aerogenes NOT DETECTED NOT DETECTED Final   Klebsiella oxytoca NOT DETECTED NOT DETECTED Final   Klebsiella pneumoniae NOT DETECTED NOT DETECTED Final   Proteus species NOT DETECTED NOT DETECTED Final   Salmonella species NOT DETECTED NOT DETECTED Final   Serratia marcescens NOT DETECTED NOT DETECTED Final   Haemophilus influenzae NOT DETECTED NOT DETECTED Final   Neisseria  meningitidis NOT DETECTED NOT DETECTED Final   Pseudomonas aeruginosa NOT DETECTED NOT DETECTED Final   Stenotrophomonas maltophilia NOT DETECTED NOT DETECTED Final   Candida albicans NOT DETECTED NOT DETECTED Final   Candida auris NOT DETECTED NOT DETECTED Final   Candida glabrata NOT DETECTED NOT DETECTED Final   Candida krusei NOT DETECTED NOT DETECTED  Final   Candida parapsilosis NOT DETECTED NOT DETECTED Final   Candida tropicalis NOT DETECTED NOT DETECTED Final   Cryptococcus neoformans/gattii NOT DETECTED NOT DETECTED Final    Comment: Performed at [facility], [address] [place], [place] [address]  Culture, blood (routine x 2)     Status: None   Collection Time: 12/30/20 10:49 AM   Specimen: BLOOD  Result Value Ref Range Status   Specimen Description   Final    BLOOD BLOOD RIGHT FOREARM Performed at [facility] [address], [place], [place] [address]    Special Requests   Final    BOTTLES DRAWN AEROBIC AND ANAEROBIC Blood Culture adequate volume Performed at [facility] [address], [place], [place] [address]    Culture   Final    NO GROWTH 5 DAYS Performed at [facility], [place] [address], [place], [place] [address]    Report Status 01/04/2021 FINAL  Final  Urine Culture     Status: Abnormal   Collection Time: 12/30/20 12:31 PM   Specimen: Urine, Clean Catch  Result Value Ref Range Status   Specimen Description   Final    URINE, CLEAN CATCH Performed at [facility], [place] [address], [place], [place] [address]    Special Requests   Final    NONE Performed at [facility], [place] [address] [place], [place] [address]    Culture MULTIPLE SPECIES PRESENT, SUGGEST RECOLLECTION (A)  Final   Report Status 01/01/2021 FINAL  Final  Culture, blood (routine x 2)     Status: None   Collection Time: 12/31/20 12:39 PM   Specimen: Left Antecubital; Blood  Result Value Ref Range  Status   Specimen Description   Final    LEFT ANTECUBITAL Performed at [facility] [address] [place], [place] [address]    Special Requests   Final    BOTTLES DRAWN AEROBIC AND ANAEROBIC Blood Culture adequate volume Performed at [facility] [address], [place], [place] [address]    Culture   Final    NO GROWTH 5 DAYS Performed at [facility], [place] [address], [place], [place] [address]    Report Status 01/05/2021 FINAL  Final  Culture, blood (routine x 2)     Status: None   Collection Time: 12/31/20 12:45 PM   Specimen: BLOOD LEFT HAND  Result Value Ref Range Status   Specimen Description   Final    BLOOD LEFT HAND Performed at [facility] [address], [place], [place] [address]    Special Requests   Final    BOTTLES DRAWN AEROBIC ONLY Blood Culture adequate volume Performed at [facility] [address], [place], [place] [address]    Culture   Final    NO GROWTH 5 DAYS Performed at [facility], [place] [address], [place], [place] [address]    Report Status 01/05/2021 FINAL  Final    Radiology Studies: No results found.  Scheduled Meds:  sodium chloride   Intravenous Once   amLODipine  5 mg Oral Daily   carvedilol  6.25 mg Oral BID WC   Chlorhexidine Gluconate Cloth  6 each Topical Daily   enoxaparin (LOVENOX) injection  60 mg Subcutaneous Q24H   feeding supplement  237 mL Oral TID BM   gabapentin  300 mg Oral QHS   insulin aspart  0-9 Units  Subcutaneous TID WC   oxyCODONE  40 mg Oral Q12H   pantoprazole  40 mg Oral BID   polyethylene glycol  17 g Oral Daily   senna-docusate  1 tablet Oral BID   sodium chloride flush  10-40 mL Intracatheter Q12H   Continuous Infusions:   LOS: 23 days    Time spent: 25 mins    Kayleen Memos, MD [facility]   If 7PM-7AM, please contact night-coverage

## 2021-01-09 ENCOUNTER — Encounter: Payer: Self-pay | Admitting: *Deleted

## 2021-01-09 LAB — CBC WITH DIFFERENTIAL/PLATELET
Abs Immature Granulocytes: 0.2 10*3/uL — ABNORMAL HIGH (ref 0.00–0.07)
Basophils Absolute: 0 10*3/uL (ref 0.0–0.1)
Basophils Relative: 0 %
Eosinophils Absolute: 0 10*3/uL (ref 0.0–0.5)
Eosinophils Relative: 1 %
HCT: 27 % — ABNORMAL LOW (ref 36.0–46.0)
Hemoglobin: 8.7 g/dL — ABNORMAL LOW (ref 12.0–15.0)
Immature Granulocytes: 3 %
Lymphocytes Relative: 24 %
Lymphs Abs: 1.5 10*3/uL (ref 0.7–4.0)
MCH: 30 pg (ref 26.0–34.0)
MCHC: 32.2 g/dL (ref 30.0–36.0)
MCV: 93.1 fL (ref 80.0–100.0)
Monocytes Absolute: 0.8 10*3/uL (ref 0.1–1.0)
Monocytes Relative: 13 %
Neutro Abs: 3.6 10*3/uL (ref 1.7–7.7)
Neutrophils Relative %: 59 %
Platelets: 185 10*3/uL (ref 150–400)
RBC: 2.9 MIL/uL — ABNORMAL LOW (ref 3.87–5.11)
RDW: 15.9 % — ABNORMAL HIGH (ref 11.5–15.5)
WBC: 6.1 10*3/uL (ref 4.0–10.5)
nRBC: 2.3 % — ABNORMAL HIGH (ref 0.0–0.2)

## 2021-01-09 LAB — TYPE AND SCREEN
ABO/RH(D): O POS
Antibody Screen: NEGATIVE
Unit division: 0
Unit division: 0

## 2021-01-09 LAB — BPAM RBC
Blood Product Expiration Date: 202301012359
Blood Product Expiration Date: 202301012359
ISSUE DATE / TIME: 202211301627
ISSUE DATE / TIME: 202211302033
Unit Type and Rh: 5100
Unit Type and Rh: 5100

## 2021-01-09 LAB — GLUCOSE, CAPILLARY
Glucose-Capillary: 105 mg/dL — ABNORMAL HIGH (ref 70–99)
Glucose-Capillary: 152 mg/dL — ABNORMAL HIGH (ref 70–99)
Glucose-Capillary: 106 mg/dL — ABNORMAL HIGH (ref 70–99)
Glucose-Capillary: 115 mg/dL — ABNORMAL HIGH (ref 70–99)

## 2021-01-09 MED ORDER — SENNOSIDES-DOCUSATE SODIUM 8.6-50 MG PO TABS
2.0000 | ORAL_TABLET | Freq: Two times a day (BID) | ORAL | Status: DC
  Administered 2021-01-09 – 2021-01-14 (×10): 2 via ORAL
  Filled 2021-01-09 (×10): qty 2

## 2021-01-09 MED ORDER — POLYETHYLENE GLYCOL 3350 17 G PO PACK
17.0000 g | PACK | Freq: Two times a day (BID) | ORAL | Status: DC
  Administered 2021-01-09 – 2021-01-14 (×9): 17 g via ORAL
  Filled 2021-01-09 (×10): qty 1

## 2021-01-09 NOTE — Progress Notes (Addendum)
HEMATOLOGY-ONCOLOGY PROGRESS NOTE  SUBJECTIVE: Pain is better controlled at this time.  Remains on OxyContin and is using oxycodone about every 4-6 hours.  She is not having any nausea or vomiting.  No bowel movement since 11/25. She has been ambulating in the hall more.  No bleeding reported.  Afebrile.  PHYSICAL EXAMINATION:  Vitals:   01/08/21 2327 01/09/21 0555  BP: 130/79 116/77  Pulse: 88 89  Resp: 18 20  Temp: 97.7 F (36.5 C) 98.3 F (36.8 C)  SpO2: 95% 93%   Filed Weights   12/16/20 1307 12/17/20 0841 12/22/20 0928  Weight: 127 kg 127 kg 127 kg    Intake/Output from previous day: 11/30 0701 - 12/01 0700 In: 2253.3 [P.O.:1012; I.V.:533.8; Blood:707.5] Out: 1000 [Urine:1000] ABDOMEN: Soft, tender with mild firmness in the left mid and lower abdomen NEURO: alert & oriented x 3 with fluent speech, no focal motor/sensory deficits Vascular: No leg edema  Port-A-Cath without erythema  LABORATORY DATA:  I have reviewed the data as listed CMP Latest Ref Rng & Units 01/08/2021 01/04/2021 01/03/2021  Glucose 70 - 99 mg/dL 111(H) 122(H) 108(H)  BUN 6 - 20 mg/dL _0 Creatinine 0.44 - 1.00 mg/dL 0.77 0.68 0.61  Sodium 135 - 145 mmol/L 138 137 138  Potassium 3.5 - 5.1 mmol/L 3.6 3.8 4.0  Chloride 98 - 111 mmol/L 106 104 107  CO2 22 - 32 mmol/L _1 Calcium 8.9 - 10.3 mg/dL 9.2 9.1 9.0  Total Protein 6.5 - 8.1 g/dL - - -  Total Bilirubin 0.3 - 1.2 mg/dL - - -  Alkaline Phos 38 - 126 U/L - - -  AST 15 - 41 U/L - - -  ALT 0 - 44 U/L - - -    Lab Results  Component Value Date   WBC 6.1 01/09/2021   HGB 8.7 (L) 01/09/2021   HCT 27.0 (L) 01/09/2021   MCV 93.1 01/09/2021   PLT 185 01/09/2021   NEUTROABS 3.6 01/09/2021    DG Chest 2 View  Result Date: 12/16/2020 CLINICAL DATA:  Shortness of breath in a 55 year old female. EXAM: CHEST - 2 VIEW COMPARISON:  July 10, 2009. FINDINGS: Linear opacities in the LEFT chest of progressed slightly since the previous  study. No lobar consolidation. Cardiomediastinal contours and hilar structures are stable. No sign of effusion. Mildly increased density in the subcarinal region on lateral projection. No acute skeletal process on limited assessment. IMPRESSION: Signs of scarring or atelectasis in the LEFT chest, slightly progressed since the previous study. Question developing subcarinal adenopathy, based on lateral projection. Developing lower lobe airspace disease is also a differential consideration. In a patient with pelvic masses on recent pelvic MRI would consider follow-up chest CT for further evaluation. Electronically Signed   By: Zetta Bills M.D.   On: 12/16/2020 14:13   CT CHEST W CONTRAST  Result Date: 12/17/2020 CLINICAL DATA:  Cancer of unknown primary.  Staging. EXAM: CT CHEST WITH CONTRAST TECHNIQUE: Multidetector CT imaging of the chest was performed during intravenous contrast administration. CONTRAST:  61m OMNIPAQUE IOHEXOL 350 MG/ML SOLN COMPARISON:  Chest CT dated 06/01/2007. FINDINGS: Cardiovascular: There is no cardiomegaly or pericardial effusion. Three-vessel coronary vascular calcification. Mild atherosclerotic calcification of the thoracic aorta. No aneurysmal dilatation. The origins of the great vessels of the aortic arch appear patent as visualized. There is dilatation of the main pulmonary trunk suggestive of pulmonary hypertension. Evaluation of the pulmonary arteries is limited due to respiratory motion artifact  and suboptimal opacification and timing of the contrast. No large or central pulmonary artery embolus identified. Mediastinum/Nodes: No hilar or mediastinal adenopathy. The esophagus is grossly unremarkable. Prior right hemithyroidectomy. No mediastinal fluid collection. Lungs/Pleura: Bibasilar subpleural atelectasis/scarring. There is no pleural effusion pneumothorax. The central airways are patent. Upper Abdomen: Indeterminate 3 cm left renal nodule, present on the prior CT, likely  a benign etiology such as adenoma. Musculoskeletal: Degenerative changes of the spine. No acute osseous pathology. IMPRESSION: 1. No acute intrathoracic pathology. No CT evidence of central pulmonary artery embolus. 2. Dilatation of the main pulmonary trunk suggestive of pulmonary hypertension. 3. Status post prior right hemithyroidectomy. 4. Bilateral linear atelectasis/scarring. 5. Aortic Atherosclerosis (ICD10-I70.0). Electronically Signed   By: Anner Crete M.D.   On: 12/17/2020 21:09   MR Lumbar Spine W Wo Contrast  Result Date: 12/16/2020 CLINICAL DATA:  Initial evaluation for low back pain, cancer suspected. EXAM: MRI LUMBAR SPINE WITHOUT AND WITH CONTRAST TECHNIQUE: Multiplanar and multiecho pulse sequences of the lumbar spine were obtained without and with intravenous contrast. CONTRAST:  18m GADAVIST GADOBUTROL 1 MMOL/ML IV SOLN COMPARISON:  Prior CT from earlier the same day. FINDINGS: Segmentation: Standard. Lowest well-formed disc space labeled the L5-S1 level. Alignment: 4 mm facet mediated anterolisthesis of L4 on L5. Alignment otherwise normal with preservation of the normal lumbar lordosis. Vertebrae: Diffusely abnormal appearance of the bone marrow is seen throughout the visualized lumbar spine and pelvis. Associated heterogeneous STIR hyperintensity with irregular heterogeneous postcontrast enhancement. Findings are highly suspicious for diffuse osseous metastatic disease. Involvement appears to be most pronounced within the T12 vertebral body as well as the visualized pelvis. Exact measurements of a discrete lesion is difficult given the overall infiltrative appearance of this finding. No associated pathologic fracture. No visible extra osseous extension of tumor at this time. Conus medullaris and cauda equina: Conus extends to the L1 level. Conus and cauda equina appear normal. No visible epidural or intracanalicular tumor. Paraspinal and other soft tissues: Mild edema within the  subcutaneous fat of the lower back, which could be related to overall volume status. Paraspinous soft tissues demonstrate no other acute finding. Retroperitoneal/iliac adenopathy noted. Left larger than right adnexal masses partially visualized. Findings better characterized on prior CT. Disc levels: L1-2:  Unremarkable. L2-3: Disc desiccation with mild disc bulge. Superimposed small right foraminal to extraforaminal disc protrusion (series 11, image 17). Mild facet hypertrophy. Underlying short pedicles with a degree of mild spinal stenosis. Mild bilateral L2 foraminal narrowing. L3-4: Disc desiccation with mild disc bulge. Superimposed shallow left extraforaminal disc protrusion with annular fissure (series 11, image 24). Mild facet hypertrophy. Underlying short pedicles. Mild spinal stenosis. Foramina remain patent. L4-5: 4 mm anterolisthesis. Disc desiccation with broad-based posterior pseudo disc bulge. Biforaminal annular fissures noted, left larger than right. Moderate bilateral facet arthrosis. Resultant moderate spinal stenosis. Mild to moderate bilateral L4 foraminal narrowing. L5-S1: Normal interspace. Mild right greater than left facet hypertrophy. No stenosis. IMPRESSION: 1. 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 No associated pathologic fracture or extra osseous extension of tumor at this time. 2. Retroperitoneal/iliac adenopathy with left larger than right adnexal masses, better characterized on prior CT. 3. Underlying multilevel degenerative spondylosis as above, most pronounced at L4-5 where there is resultant moderate spinal stenosis. Electronically Signed   By: BJeannine BogaM.D.   On: 12/16/2020 23:44   [place] Complete  Result Date: 12/16/2020 CLINICAL DATA:  Pelvic pain EXAM: TRANSABDOMINAL ULTRASOUND OF PELVIS DOPPLER ULTRASOUND OF OVARIES TECHNIQUE:  Transabdominal ultrasound examination of the pelvis was performed including evaluation of the uterus, ovaries, adnexal regions, and pelvic cul-de-sac. Color and duplex Doppler ultrasound was utilized to evaluate blood flow to the ovaries. COMPARISON:  CT 12/16/2020, MRI 12/05/2020, pelvic ultrasound 12/01/2020 FINDINGS: Uterus Measurements: 6.8 x 3.4 x 5 cm = volume: 60.5 mL. No fibroids or other mass visualized. Endometrium Poorly visible, unable to measure. Right ovary Measurements: 4.8 x 2.9 x 5.5 cm = volume: 38.6 mL. Solid right adnexal mass replaces most of right ovary. Left ovary Measurements: 10.5 x 8.7 x 9.2 cm = volume: 440 mL. Enlarged left ovary with heterogenous solid mass that replaces the left ovary. Previously the left ovary measured 8.3 x 7.7 x 8.5 cm on MRI. Pulsed Doppler evaluation demonstrates normal arterial and venous waveforms in both or adnexa, though note that both ovaries are essentially replaced by mass lesions. Other: No significant ascites. Foley catheter within the bladder which appears somewhat thick walled. IMPRESSION: 1. Very limited exam secondary to habitus and only trans abdominal technique 2. Bilateral solid adnexal masses essentially replacing the ovaries which thereby limits assessment of the ovaries. Left adnexal solid mass lesion has significantly increased in size; documented flow in the adnexa relates to flow within the solid adnexal masses. Gynecology follow-up recommended. Electronically Signed   By: Donavan Foil M.D.   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 EXAM: CT ABDOMEN AND PELVIS WITH CONTRAST TECHNIQUE: Multidetector CT imaging of the abdomen and pelvis was performed using the standard protocol following bolus administration of intravenous contrast. CONTRAST:  66m OMNIPAQUE IOHEXOL 350 MG/ML SOLN  COMPARISON:  Comparison is made with multiple prior studies which were performed recently, most recent comparison is an MRI of the pelvis of December 05, 2020. FINDINGS: Lower chest: Basilar atelectasis. Subcarinal region not well evaluated. No consolidation or effusion at the lung bases. Hepatobiliary: No focal, suspicious hepatic lesion. No pericholecystic stranding. Portal vein is patent. No biliary duct dilation. Pancreas: Pancreas with normal contours. No signs of adjacent inflammation. Spleen: Spleen normal size and contour. Adrenals/Urinary Tract: Well-circumscribed LEFT adrenal lesion, density value of 19 Hounsfield units on previous noncontrast imaging measuring 3.2 x 2.6 cm., relative washout is indeterminate at 30%. Normal RIGHT adrenal. Normal, symmetric enhancement of bilateral kidneys without focal renal lesion. Stomach/Bowel: Small hiatal hernia. No stranding adjacent to the stomach. No sign of small bowel obstruction or acute small bowel process. The appendix is normal. Stool in various parts of the colon without signs of obstruction or adjacent stranding. Vascular/Lymphatic: Retroperitoneal adenopathy (image 50/2) 14 mm lymph node previously 11 mm along the LEFT common iliac chain. LEFT pelvic sidewall lymph node (image 75/2) 14 mm short axis, previously 13 mm. Similar size of RIGHT pelvic sidewall/external iliac lymph nodes also with little change compared to the most recent comparison imaging. Reproductive: Foley catheter in situ. Uterus and cervix not well at assess nor are the masses within the area of the cervix and urethra seen on previous MRI. Increasing size of LEFT ovary with heterogeneous appearance now measuring 10 x 8.8 cm. When assessed on the study of October 20, 2020 the ovary for reference measured 3.7 x 2.5 cm. RIGHT ovary with area of enhancement shows enlargement as well but not as pronounced as on previous imaging with  2.8 cm area of enhancement in the RIGHT ovary as the largest  discrete area which is similar to the recent MRI evaluation. As compared to the study of October 20, 2020 there is little change in the appearance of the RIGHT ovary aside from this area though there is an increasingly nodular appearance of the ovary in this location. Mass posterior to the LEFT ovary just above the uterus measuring 4.4 x 2.8 cm has enlarged from 2.5 cm. Other: Small volume ascites. Musculoskeletal: Heterogeneous pattern associated with the spine in terms of density raises the question of metastatic disease without discrete measurable focal lesion in the spine. For example a geographic area of sclerosis on image 101/5) in the anterior L3 vertebral body does raise the question of underlying lesion with numerous additional foci, essentially nearly all levels of the spine with some degree of heterogeneity. Another example of this finding is noted on the RIGHT at the T10 level. No acute bone finding or frankly destructive bone process. IMPRESSION: Rapid increase in size of the LEFT ovary with some adjacent ascites. The rapid enlargement is seen in the setting of generalized worsening of masses associated with the LEFT and RIGHT ovary but perhaps more pronounced than other areas in the pelvis. The possibility of torsion associated with an ovarian mass could be considered based on this finding. Would correlate with any worsening abdominal pain and with sonogram as warranted for further assessment. Enlarging masses elsewhere though not to the extent that is seen with the LEFT ovary. Masslike area of the cervix and potentially within a urethral diverticulum not as well seen as on the recent MRI evaluation. Small volume ascites. Well-circumscribed LEFT adrenal lesion measuring 3.2 x 2.6 cm, density value of 19 Hounsfield units on previous noncontrast imaging. Relative washout is indeterminate at 30%. This may represent an adrenal adenoma. Consider dedicated adrenal protocol CT or attention on PET evaluation  if performed. Heterogeneous pattern of subtle sclerosis and lucency in the spine on today's study raising the question of bony metastatic disease in the spine. Findings of increased ovarian size on the LEFT out of proportion other areas were called by telephone at the time of interpretation on 12/16/2020 at 5:44 pm to provider Theodis Blaze , who verbally acknowledged these results. Electronically Signed   By: Zetta Bills M.D.   On: 12/16/2020 17:46   [place] Art/Ven Flow Abd Pelv Doppler  Result Date: 12/16/2020 CLINICAL DATA:  Pelvic pain EXAM: TRANSABDOMINAL ULTRASOUND OF PELVIS DOPPLER ULTRASOUND OF OVARIES TECHNIQUE: Transabdominal ultrasound examination of the pelvis was performed including evaluation of the uterus, ovaries, adnexal regions, and pelvic cul-de-sac. Color and duplex Doppler ultrasound was utilized to evaluate blood flow to the ovaries. COMPARISON:  CT 12/16/2020, MRI 12/05/2020, pelvic ultrasound 12/01/2020 FINDINGS: Uterus Measurements: 6.8 x 3.4 x 5 cm = volume: 60.5 mL. No fibroids or other mass visualized. Endometrium Poorly visible, unable to measure. Right ovary Measurements: 4.8 x 2.9 x 5.5 cm = volume: 38.6 mL. Solid right adnexal mass replaces most of right ovary. Left ovary Measurements: 10.5 x 8.7 x 9.2 cm = volume: 440 mL. Enlarged left ovary with heterogenous solid mass that replaces the left ovary. Previously the left ovary measured 8.3 x 7.7 x 8.5 cm on MRI. Pulsed Doppler evaluation demonstrates normal arterial and venous waveforms in both or adnexa, though note that both ovaries are essentially replaced by mass lesions. Other: No significant ascites. Foley catheter within the bladder which appears somewhat thick walled. IMPRESSION: 1. Very limited exam secondary  to habitus and only trans abdominal technique 2. Bilateral solid adnexal masses essentially replacing the ovaries which thereby limits assessment of the ovaries. Left adnexal solid mass lesion has significantly increased  in size; documented flow in the adnexa relates to flow within the solid adnexal masses. Gynecology follow-up recommended. Electronically Signed   By: Donavan Foil M.D.   On: 12/16/2020 19:23   DG CHEST PORT 1 VIEW  Result Date: 12/31/2020 CLINICAL DATA:  Chest pain EXAM: PORTABLE CHEST 1 VIEW COMPARISON:  Previous studies including the examination of 12/30/2020 FINDINGS: Transverse diameter of heart is increased. There are no signs of pulmonary edema or focal pulmonary consolidation. There are small linear densities in both mid lung fields and left lower lung fields suggesting subsegmental atelectasis with interval improvement in the left lung and possible slight worsening in the right mid lung fields. IMPRESSION: Cardiomegaly. There are no signs of pulmonary edema or focal pulmonary consolidation. There are small linear densities in the both mid and left lower lung fields suggesting subsegmental atelectasis with some improvement in the left lung. Electronically Signed   By: Elmer Picker M.D.   On: 12/31/2020 13:39   DG CHEST PORT 1 VIEW  Result Date: 12/30/2020 CLINICAL DATA:  Fever, abdominal pain EXAM: PORTABLE CHEST 1 VIEW COMPARISON:  Previous studies including the CT chest done on 12/17/2020 FINDINGS: Transverse diameter of heart is increased. There are linear densities in both parahilar regions and medial left lower lung fields suggesting scarring or subsegmental atelectasis with interval worsening in the left lower lung fields. There is no significant pleural effusion or pneumothorax. Surgical clips are seen in the right thyroid bed. IMPRESSION: Cardiomegaly. There are linear densities in both parahilar regions and left lower lung fields with interval worsening suggesting subsegmental atelectasis. There are no signs of alveolar pulmonary edema or focal pulmonary consolidation. Electronically Signed   By: Elmer Picker M.D.   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 EXAM: IMPLANTED PORT A CATH PLACEMENT WITH ULTRASOUND AND FLUOROSCOPIC GUIDANCE COMPARISON:  None. MEDICATIONS: None. ANESTHESIA/SEDATION: Moderate (conscious) sedation was employed during this procedure. A total of Versed 3 mg and Fentanyl 100 mcg was administered intravenously. Moderate Sedation Time: 19 minutes. The patient's level of consciousness and vital signs were monitored continuously by radiology nursing throughout the procedure under my direct supervision. CONTRAST:  None FLUOROSCOPY TIME:  0 minutes, 12 seconds (5 mGy) COMPLICATIONS: None immediate. PROCEDURE: The procedure, risks, benefits, and alternatives were explained to the patient. Questions regarding the procedure were encouraged and answered. The patient understands and consents to the procedure. The right neck and chest were prepped with chlorhexidine in a sterile fashion, and a sterile drape was applied covering the operative field. Maximum barrier sterile technique with sterile gowns and gloves were used for the procedure. A timeout was performed prior to the initiation of the procedure. Ultrasound was used to examine the jugular vein which was compressible and free of internal echoes. A skin marker was used to demarcate the planned venotomy and port pocket incision sites. Local anesthesia was provided to these sites and the subcutaneous tunnel track with 1% lidocaine with 1:100,000 epinephrine. A small incision was created at the jugular access site and blunt dissection was performed of the subcutaneous tissues. Under ultrasound guidance, the jugular vein was accessed with a 21 ga micropuncture needle and an 0.018"  wire was inserted to the superior vena cava. Real-time ultrasound guidance was utilized for vascular access including the acquisition of a  permanent ultrasound image documenting patency of the accessed vessel. A 5 Fr micopuncture set was then used, through which a 0.035" Rosen wire was passed under fluoroscopic guidance into the inferior vena cava. An 8 Fr dilator was then placed over the wire. A subcutaneous port pocket was then created along the upper chest wall utilizing a combination of sharp and blunt dissection. The pocket was irrigated with sterile saline, packed with gauze, and observed for hemorrhage. A single lumen "ISP" sized power injectable port was chosen for placement. The 8 Fr catheter was tunneled from the port pocket site to the venotomy incision. The port was placed in the pocket. The external catheter was trimmed to appropriate length. The dilator was exchanged for an 8 Fr peel-away sheath under fluoroscopic guidance. The catheter was then placed through the sheath and the sheath was removed. Final catheter positioning was confirmed and documented with a fluoroscopic spot radiograph. The port was accessed with a Huber needle, aspirated, and flushed with heparinized saline. The deep dermal layer of the port pocket incision was closed with interrupted 3-0 Vicryl suture. The skin was opposed with a running subcuticular 4-0 Monocryl suture. Dermabond was then placed over the port pocket and neck incisions. The patient tolerated the procedure well without immediate post procedural complication. FINDINGS: After catheter placement, the tip lies within the superior cavoatrial junction. The catheter aspirates and flushes normally and is ready for immediate use. IMPRESSION: Successful placement of a power injectable Port-A-Cath via the right internal jugular vein. The catheter is ready for immediate use. Ruthann Cancer, MD [facility] Radiology Electronically Signed   By: Ruthann Cancer M.D.   On: 12/26/2020 11:08   VAS [place] LOWER EXTREMITY VENOUS (DVT)  Result Date: 12/29/2020  Lower Venous  DVT Study Patient Name:  Erin [name]  Date of Exam:   12/29/2020 Medical Rec #: [id number]            Accession #:    [id number] Date of Birth: [birth year]            Patient Gender: F Patient Age:   39 years Exam Location:  [facility] Procedure:      VAS [place] LOWER EXTREMITY VENOUS (DVT) Referring Phys: A POWELL JR --------------------------------------------------------------------------------  Indications: Pain.  Risk Factors: Cancer. Limitations: Body habitus and poor ultrasound/tissue interface. Comparison Study: No prior studies. Performing Technologist: Oliver Hum RVT  Examination Guidelines: A complete evaluation includes B-mode imaging, spectral Doppler, color Doppler, and power Doppler as needed of all accessible portions of each vessel. Bilateral testing is considered an integral part of a complete examination. Limited examinations for reoccurring indications may be performed as noted. The reflux portion of the exam is performed with the patient in reverse Trendelenburg.  +---------+---------------+---------+-----------+----------+--------------+ RIGHT    CompressibilityPhasicitySpontaneityPropertiesThrombus Aging +---------+---------------+---------+-----------+----------+--------------+ CFV      Full           Yes      Yes                                 +---------+---------------+---------+-----------+----------+--------------+ SFJ      Full                                                        +---------+---------------+---------+-----------+----------+--------------+  FV Prox  Full                                                        +---------+---------------+---------+-----------+----------+--------------+ FV Mid   Full                                                        +---------+---------------+---------+-----------+----------+--------------+ FV DistalFull                                                         +---------+---------------+---------+-----------+----------+--------------+ PFV      Full                                                        +---------+---------------+---------+-----------+----------+--------------+ POP      Full           Yes      Yes                                 +---------+---------------+---------+-----------+----------+--------------+ PTV      Full                                                        +---------+---------------+---------+-----------+----------+--------------+ PERO     Full                                                        +---------+---------------+---------+-----------+----------+--------------+   +---------+---------------+---------+-----------+----------+--------------+ LEFT     CompressibilityPhasicitySpontaneityPropertiesThrombus Aging +---------+---------------+---------+-----------+----------+--------------+ CFV      Full           Yes      Yes                                 +---------+---------------+---------+-----------+----------+--------------+ SFJ      Full                                                        +---------+---------------+---------+-----------+----------+--------------+ FV Prox  Full                                                        +---------+---------------+---------+-----------+----------+--------------+  FV Mid   Full                                                        +---------+---------------+---------+-----------+----------+--------------+ FV DistalFull                                                        +---------+---------------+---------+-----------+----------+--------------+ PFV      Full                                                        +---------+---------------+---------+-----------+----------+--------------+ POP      Full           Yes      Yes                                  +---------+---------------+---------+-----------+----------+--------------+ PTV      Full                                                        +---------+---------------+---------+-----------+----------+--------------+ PERO     Full                                                        +---------+---------------+---------+-----------+----------+--------------+     Summary: RIGHT: - There is no evidence of deep vein thrombosis in the lower extremity.  - No cystic structure found in the popliteal fossa.  LEFT: - There is no evidence of deep vein thrombosis in the lower extremity.  - No cystic structure found in the popliteal fossa.  *See table(s) above for measurements and observations. Electronically signed by Jamelle Haring on 12/29/2020 at 12:00:16 PM.    Final     ASSESSMENT AND PLAN: 1.  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 -12/05/2020 MRI of the pelvis-poorly marginated enhancing 3.7 x 2.8 x 3.0 cm mass centered at the urethra, solid avidly enhancing bilateral ovarian masses, poorly marginated enhancing 2.4 x 2.3 x 3.0 cm mass in the left uterine cervix, mild to moderate bilateral common iliac, bilateral external iliac, and bilateral inguinal lymphadenopathy, diffuse patchy confluent nodular replacement of the pelvic osseous structures. -12/13/2020 CEA 1864, CA125 29.2 -12/16/2020 CT abdomen/pelvis-rapidly increasing size of the left ovary with some adjacent ascites, enlarging masses elsewhere, masslike area of the cervix and potentially within a urethral diverticulum, well-circumscribed left adrenal lesion measuring 3.2 x 2.6 cm, heterogeneous pattern of subtle sclerosis and lucency in the spine. -12/16/2020 MRI of the lumbar spine-diffusely abnormal appearance of the bone marrow throughout the visualized lumbar spine and pelvis highly suspicious for diffuse osseous metastatic disease, retroperitoneal/iliac  adenopathy with left larger than  right adnexal masses. 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  Abdominal pain secondary #1 3.  Constipation 4.  Normocytic anemia 5.  Leukocytosis 6.  Protein calorie malnutrition 7.  Obstructive sleep apnea 8.  Diabetes mellitus 9.  Hypertension 10.  Possible pulmonary hypertension noted on CT chest  Erin [name] appears unchanged. Today is day 13 of cycle 1 of FOLFOX.  She is due for second cycle of chemotherapy on 12/2.  I have sent an email to alert the chemotherapy team of need for chemotherapy on 12/2.  CBC with differential was performed this morning and is adequate for treatment tomorrow.  She has anemia which is due to bone marrow involvement of her malignancy. We will need CMET in the a.m.  No need for daily lab work.  Pain is adequately controlled at this time.  She is using oxycodone routinely in addition to OxyContin.  If she is using less breakthrough pain medication, would recommend decreasing dose of OxyContin.  She is having constipation despite being on MiraLAX daily and Senokot 1 tablet twice daily.  I have adjusted her MiraLAX to be twice a day and Senokot-S was changed to 2 tablets twice a day.  Nursing plans to administer a dose of lactulose today.  She is awaiting SNF placement for short-term rehab.  Okay to discharge to SNF after chemotherapy completed and once bed available.  Recommendations: 1.  Continue current pain medication and will consider decreasing OxyContin if breakthrough pain medication use decreases. 2.  We will plan to administer second cycle of FOLFOX on 12/2.  DBC with  differential was drawn today and will check c-Met in the a.m. 3.  Plan for outpatient PET scan  4.  We will follow-up on molecular testing, HER2, and PD-L1 results. 5.  No need for daily lab work from our standpoint. 6.  Adjust MiraLAX to twice a day and Senokot S to 2 tablets twice a day.  She also has lactulose as needed. 7.  Okay to discharge to SNF after chemotherapy completed and bed available.  Future Appointments  Date Time Provider [place]  02/18/2021 10:45 AM [name], PA-C [facility]      LOS: 24 days   [name] 01/09/21  Erin [name] was interviewed and examined.  She appears stable.  The hemoglobin is higher following the Red cell transfusion yesterday.  The anemia is secondary to bone marrow involvement with cancer, chronic disease, chemotherapy, and phlebotomy.  She will be scheduled for cycle 2 FOLFOX tomorrow.  I was present for greater than 50% of today's visit.  I informed medical decision making.

## 2021-01-09 NOTE — Progress Notes (Signed)
Ms Drennen is scheduled to receive  Cycle 2 FOLFOX while inpatient on 01/10/21 Orders written, [place] is aware of preparation. Alfonse Flavors RN will be administering.It is requested that premeds be administered at 1300 on 12/2 in order that chemo can be given at 1400.  Communications sent to [place] and Inpatient Chemo team.

## 2021-01-09 NOTE — Progress Notes (Signed)
Mobility Specialist - Progress Note    01/09/21 1235  Mobility  Activity Ambulated in hall  Level of Assistance Minimal assist, patient does 75% or more  Assistive Device Front wheel walker  Distance Ambulated (ft) 115 ft  Mobility Ambulated with assistance in hallway  Mobility Response Tolerated well  Mobility performed by Mobility specialist  $Mobility charge 1 Mobility   Upon entry pt was agreeable to mobilize and needed Min A to stand from recliner. Pt ambulated ~115 ft while using RW. 2 standing rest breaks required to help pt practice pursed breathing. Returning to room, pt sat in recliner and expressed pain in lower abdomen rating it a 6/10. Pt also expressed feeling worrisome about chemo, but stated "I want to live and I am determined to do this. I'm just scared." Stayed with pt to provide comfort. Once done, pt was left in recliner with call bell at side and awaiting for NT.   [place] Specialist Acute Rehabilitation Services Phone: [phone] 01/09/21, 12:40 PM

## 2021-01-09 NOTE — Progress Notes (Signed)
PROGRESS NOTE    SHERRILYNN GUDGEL  WCH:[id number] DOB: 18-May-1965 DOA: 12/16/2020 PCP: Pcp, No   Brief Narrative:  This 55 years old female with PMH significant for OSA on CPAP, HTN, DMII, recent diagnosis of ovarian anterolateral mass, morbid obesity who presented to [facility] ED with lower abdominal pain and lower extremity pain.  She has been followed by Dr. Berline Lopes with gynecology oncology for bilateral ovarian masses and endometrial mass.  Was prescribed oxycodone by gynecology oncology for her pain but it was not fully effective so she presented to the ED for further evaluation.  Work-up revealed rapid increase in the size of the left ovary with some adjacent ascites, masslike area of the cervix and potentially within the urethral diverticulum seen on CT abdomen and pelvis, not seen on prior MRI.  Left adrenal lesion measuring 3.2 x 2.6 cm, lucency in the spine concerning for metastatic disease.  MRI of the lumbar spine 12/16/2020 showed diffusely abnormal appearance of the marrow highly suspicious for diffuse osseous metastatic disease.  Retroperitoneal/iliac adenopathy with left larger than right adnexal masses.  Was taken to the OR on 12/17/2020 for EUA and cervical biopsies as well as diagnostic cystoscopy, bladder biopsy, endometrial biopsy.  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  Ongoing chemotherapy.  Plan to continue chemotherapy.  Next chemotherapy on 01/10/2021.  Hospital course complicated by drop in hemoglobin 6.8 K on 01/08/2021, 2 units PRBCs ordered to be transfused with patient's verbal consent.  01/09/2021: Patient was seen at her bedside.  She had no new complaints.  There were no acute events overnight.  Plan for chemotherapy on 01/10/2021.  Difficult placement, TOC assisting.  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 MRI showed poorly marginated enhancing 3.7 x 2.8 x 3.0 cm mass centered at urethra concerning for malignancy -- solid avidly enhancing bilateral ovarian masses, largest 2.3 cm on Rt and 8.3 cm on Lt concerning for bilateral ovarian mets -- poorly marginated enhancing 2.4x2.3x3.0 cm mass in L uterine cervix, concerning for malignancy -- bilateral common iliac, bilateral external iliac, and bilateral inguinal LAD, concerning for metastatic disease -- diffuse patchy confluent nodular replacement of the pelvic osseus structures, concerning for osseus metastatic disease (see report) - Biopsy taken during cystoscopy on 12/17/2020 - biopsy has returned on 11/11. 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  gastric nodule bx and notable for adenocarcinoma, poorly differentiated with signet ring morphology.  Focal invastion of muscularis mucosa.  Muscularis propia not present in bx.  Per path reports, this most likely represents primary source of metastatic carcinoma dx on bx of vagina on 11/8. - Oncology following, now s/p port placement 11/17  Appreciate oncology's assistance.   Suspected metastatic gastric cancer : Oncology awaiting molecular testing to determine treatment options. Cycle  1 folfox started ON 11/18 per oncology, had planned to discharge to CIR on Monday after chemo complete. Patient need to continue chemotherapy which cannot be completed in CIR or in SNF.  Difficult placement.  TOC assisting.  Acute blood loss anemia in the setting of chemotherapy Hemoglobin dropped this morning 6.8, 2 units PRBC ordered to be transfused on 01/08/2021.  Repeat CBC posttransfusion. Transfused 1 unit PRBC previously during this admission.  Fever: > Resolved. Patient has spiked fever could be related to malignancy. Hold abx for now, consider starting if recurrent or spike > 101 F 1 set of cultures from 11/21 with gram positive cocci with staph species (suspect contaminant) Repeat blood cultures NGTD Urine culture contaminated. Repeat CXR with subsegmental atelectasis.  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  - gastric nodule bx and notable for adenocarcinoma, poorly differentiated with signet ring morphology.  Focal invastion of muscularis mucosa.  Muscularis propia not present in bx.  Per path reports, this most likely represents primary source of metastatic carcinoma dx on bx of vagina on 11/8. - Onco recommended Outpatient PET study as outpatient   Chronic pain syndrome: Continue long-acting oxycontin 40 mg twice daily. Reduce short acting oxycodone to 10 mg every 6 as needed Palliative care consulted for pain management. LE [place] for LE pain negative.  Trial flexeril.  Increase gabapentin. Continue pain control as needed   Panic attacks: Continue benzo as needed  Gastritis: EGD with gastritis PPI x 6 weeks then 40 mg daily Colonoscopy with 5 polyps removed (1 in ascending and 4 in sigmoid) bx as noted above   Ovarian mass: Noted on MRI pelvis Workup as noted above.   Acute lower UTI-resolved as of 12/24/2020 S/p ceftriaxone Culture  with strep viridans resolved    Malnutrition of moderate degree - Moderate malnutrition in context of acute illness/injury, morbid obesity - Evaluated by dietitian, appreciate assistance - per RD: "Moderate Malnutrition related to acute illness as evidenced by percent weight loss, moderate muscle depletion, energy intake < 75% for > 7 days."    OSA (obstructive sleep apnea) Continue CPAP.   Diabetes mellitus ([place]) Continue SSI   Essential hypertension BP is at goal Continue home regimen Continue amlodipine, coreg  Physical debility/ambulatory dysfunction Per CIR cannot do chemoinfusion on CIR. Continue PT OT with assistance and fall precautions     DVT prophylaxis: Lovenox subcu daily Code Status: Full code Family Communication: No family at bedside. Disposition Plan:   Status is: Inpatient  Remains inpatient appropriate because: Patient is medically clear.   Yes.  Awaiting CIR.  Patient need to continue chemotherapy which cannot be completed in CIR. TOC is working on finding SNF.  Patient is medically clear for discharge : Yes  Consultants:  Oncology IR Urology GI Palliative care D1 oncology  Procedures:  Diagnostic cystoscopy, bladder biopsy, urethral biopsy, 12/17/20 Cervical biopsies, 12/17/2020 EGD and colonoscopy 12/22/20 Port placement by IR 11/17   Antimicrobials:  Anti-infectives (From admission, onward)    Start     Dose/Rate Route Frequency Ordered Stop   12/23/20 1000  cefTRIAXone (ROCEPHIN) 1 g in sodium chloride 0.9 % 100 mL IVPB        1 g 200 mL/hr over 30 Minutes Intravenous Every 24 hours 12/23/20 0746 12/24/20 1030   12/20/20 1000  cefTRIAXone (ROCEPHIN) 1 g in sodium chloride 0.9 % 100 mL IVPB        1 g 200 mL/hr over 30 Minutes Intravenous Every 24 hours 12/20/20 0752 12/22/20 1451   12/17/20 0930  ceFAZolin (ANCEF) 3 g in dextrose 5 % 50 mL IVPB  Status:  Discontinued        3 g 100 mL/hr over 30 Minutes Intravenous  Once  12/17/20 0925 12/17/20 0926   12/17/20 0915  ceFAZolin (ANCEF) IVPB 3g/100 mL premix        3 g 200 mL/hr over 30 Minutes Intravenous  Once 12/17/20 0914 12/17/20 0935        Objective: Vitals:   01/08/21 2053 01/08/21 2327 01/09/21 0555 01/09/21 1305  BP: 139/73 130/79 116/77 118/80  Pulse: 93 88 89 86  Resp: 17 18 20 15   Temp: 98.1 F (36.7 C) 97.7 F (36.5 C) 98.3 F (36.8 C) 98.6 F (37 C)  TempSrc: Oral Oral Oral Oral  SpO2: 97% 95% 93% 94%  Weight:      Height:        Intake/Output Summary (Last 24 hours) at 01/09/2021 1655 Last data filed at 01/09/2021 1000 Gross per 24 hour  Intake 917.5 ml  Output 1000 ml  Net -82.5 ml   Filed Weights   12/16/20 1307 12/17/20 0841 12/22/20 0928  Weight: 127 kg 127 kg 127 kg    Examination: No significant changes from prior exam.  General exam: Appears well-developed well-nourished in no acute distress.  She is alert and oriented x3. Respiratory system: Clear to auscultation with no wheezes or rales.   Cardiovascular system: Regular rate and rhythm no rubs or gallops.  No JVD or thyromegaly noted.   Gastrointestinal system: Appears soft nondistended bowel sounds present.   Central nervous system: Alert and oriented x3.  Nonfocal exam.  Extremities: Trace edema in lower extremities bilaterally.   Skin: No rashes or ulcerative lesions. Psychiatry: Mood is appropriate for condition and setting.   Data Reviewed: I have personally reviewed following labs and imaging studies  CBC: Recent Labs  Lab 01/04/21 0500 01/05/21 0658 01/06/21 0625 01/08/21 0451 01/08/21 0830 01/09/21 0408  WBC 5.8 6.4 6.5 6.4  --  6.1  NEUTROABS  --   --   --   --   --  3.6  HGB 7.1* 7.4* 7.4* 6.8* 6.8* 8.7*  HCT 22.8* 23.3* 23.7* 22.1* 22.1* 27.0*  MCV 95.4 95.1 96.7 96.5  --  93.1  PLT 147* 152 150 186  --  891   Basic Metabolic Panel: Recent Labs  Lab 01/03/21 0620 01/04/21 0500 01/08/21 0451  NA 138 137 138  K 4.0 3.8 3.6  CL 107  104 106  CO2 25 25 25   GLUCOSE 108* 122* 111*  BUN 17 14 16   CREATININE 0.61 0.68 0.77  CALCIUM 9.0 9.1 9.2  MG  --   --  2.1  PHOS  --   --  3.4   GFR: Estimated Creatinine Clearance: 106.1 mL/min (by C-G formula based on SCr of 0.77 mg/dL). Liver Function Tests: No results for input(s): AST, ALT, ALKPHOS, BILITOT, PROT, ALBUMIN in the last 168 hours.  No results for input(s): LIPASE, AMYLASE in the last 168 hours. No results for input(s): AMMONIA in the last 168 hours. Coagulation Profile: No results for input(s): INR, PROTIME in the last 168 hours.  Cardiac Enzymes: No results for input(s): CKTOTAL, CKMB, CKMBINDEX, TROPONINI in the last 168 hours. BNP (last 3 results)  No results for input(s): PROBNP in the last 8760 hours. HbA1C: No results for input(s): HGBA1C in the last 72 hours. CBG: Recent Labs  Lab 01/08/21 1209 01/08/21 1655 01/08/21 2209 01/09/21 0734 01/09/21 1235  GLUCAP 153* 118* 102* 105* 152*   Lipid Profile: No results for input(s): CHOL, HDL, LDLCALC, TRIG, CHOLHDL, LDLDIRECT in the last 72 hours. Thyroid Function Tests: No results for input(s): TSH, T4TOTAL, FREET4, T3FREE, THYROIDAB in the last 72 hours. Anemia Panel: No results for input(s): VITAMINB12, FOLATE, FERRITIN, TIBC, IRON, RETICCTPCT in the last 72 hours. Sepsis Labs: No results for input(s): PROCALCITON, LATICACIDVEN in the last 168 hours.  Recent Results (from the past 240 hour(s))  Culture, blood (routine x 2)     Status: None   Collection Time: 12/31/20 12:39 PM   Specimen: Left Antecubital; Blood  Result Value Ref Range Status   Specimen Description   Final    LEFT ANTECUBITAL Performed at [facility] [address], [place], [place] [address]    Special Requests   Final    BOTTLES DRAWN AEROBIC AND ANAEROBIC Blood Culture adequate volume Performed at [facility] [address] [place], [place] [address]    Culture   Final    NO  GROWTH 5 DAYS Performed at [facility], [place] [address] [place], [place] [address]    Report Status 01/05/2021 FINAL  Final  Culture, blood (routine x 2)     Status: None   Collection Time: 12/31/20 12:45 PM   Specimen: BLOOD LEFT HAND  Result Value Ref Range Status   Specimen Description   Final    BLOOD LEFT HAND Performed at [facility] [address], [place], [place] [address]    Special Requests   Final    BOTTLES DRAWN AEROBIC ONLY Blood Culture adequate volume Performed at [facility] [address], [place], [place] [address]    Culture   Final    NO GROWTH 5 DAYS Performed at [facility], [place] [address] [place], [place] [address]    Report Status 01/05/2021 FINAL  Final    Radiology Studies: No results found.  Scheduled Meds:  amLODipine  5 mg Oral Daily   carvedilol  6.25 mg Oral BID WC   Chlorhexidine Gluconate Cloth  6 each Topical Daily   enoxaparin (LOVENOX) injection  60 mg Subcutaneous Q24H   feeding supplement  237 mL Oral TID BM   gabapentin  300 mg Oral QHS   insulin aspart  0-9 Units Subcutaneous TID WC   oxyCODONE  40 mg Oral Q12H   pantoprazole  40 mg Oral BID   polyethylene glycol  17 g Oral BID   senna-docusate  2 tablet Oral BID   sodium chloride flush  10-40 mL Intracatheter Q12H   Continuous Infusions:   LOS: 24 days    Time spent: 25 mins    Kayleen Memos, MD [facility]   If 7PM-7AM, please contact night-coverage

## 2021-01-09 NOTE — TOC Progression Note (Signed)
Transition of Care [facility]) - Progression Note    Patient Details  Name: Erin [name] MRN: [id number] Date of Birth: 1965-12-16  [facility] CM/SW Contact  Lennart Pall, LCSW Phone Number: 01/09/2021, 2:09 PM  Clinical Narrative:    Met with patient today to review status of discharge planning and the barriers.  Pt aware that CIR has declined admit due to chemo plan and that plan then was changed to SNF for rehab. Unfortunately, per discussion with [facility] supervisor and reviewing the chemo plan, this is not a plan that SNF could manage either.  Have explained that the focus will need to shift to home discharge and improving her mobility.  Have alerted medical team and therapy supervisor.  Pt reports she has only limited support from family as none are living in [place].  She would need to be mod independent/ independent to dc home.   Expected Discharge Plan: IP Rehab Facility Barriers to Discharge: Continued Medical Work up, Inadequate or no insurance  Expected Discharge Plan and Services Expected Discharge Plan: [place] In-house Referral: Clinical Social Work   Post Acute Care Choice: IP Rehab Living arrangements for the past 2 months: Apartment                 DME Arranged: N/A DME Agency: NA                   Social Determinants of Health (SDOH) Interventions    Readmission Risk Interventions Readmission Risk Prevention Plan 01/06/2021  Transportation Screening Complete  Medication Review [employer]) Complete  PCP or Specialist appointment within 3-5 days of discharge Complete  HRI or Home Care Consult Complete  SW Recovery Care/Counseling Consult Complete  Palliative Care Screening Not [place] Complete  Some recent data might be hidden

## 2021-01-10 LAB — COMPREHENSIVE METABOLIC PANEL
ALT: 15 U/L (ref 0–44)
AST: 17 U/L (ref 15–41)
Albumin: 2.3 g/dL — ABNORMAL LOW (ref 3.5–5.0)
Alkaline Phosphatase: 476 U/L — ABNORMAL HIGH (ref 38–126)
Anion gap: 7 (ref 5–15)
BUN: 14 mg/dL (ref 6–20)
CO2: 25 mmol/L (ref 22–32)
Calcium: 9.2 mg/dL (ref 8.9–10.3)
Chloride: 104 mmol/L (ref 98–111)
Creatinine, Ser: 0.81 mg/dL (ref 0.44–1.00)
GFR, Estimated: >60 mL/min (ref >60)
Glucose, Bld: 113 mg/dL — ABNORMAL HIGH (ref 70–99)
Potassium: 3.3 mmol/L — ABNORMAL LOW (ref 3.5–5.1)
Sodium: 136 mmol/L (ref 135–145)
Total Bilirubin: 0.8 mg/dL (ref 0.3–1.2)
Total Protein: 5.9 g/dL — ABNORMAL LOW (ref 6.5–8.1)

## 2021-01-10 LAB — GLUCOSE, CAPILLARY
Glucose-Capillary: 128 mg/dL — ABNORMAL HIGH (ref 70–99)
Glucose-Capillary: 167 mg/dL — ABNORMAL HIGH (ref 70–99)
Glucose-Capillary: 212 mg/dL — ABNORMAL HIGH (ref 70–99)
Glucose-Capillary: 166 mg/dL — ABNORMAL HIGH (ref 70–99)

## 2021-01-10 MED ORDER — SODIUM CHLORIDE 0.9 % IV SOLN: 2000.0000 mg/m2 | INTRAVENOUS | Status: DC

## 2021-01-10 MED ORDER — BISACODYL 10 MG RE SUPP: 10.0000 mg | Freq: Every day | RECTAL | Status: DC | PRN

## 2021-01-10 MED ORDER — PALONOSETRON HCL INJECTION 0.25 MG/5ML
0.2500 mg | Freq: Once | INTRAVENOUS | Status: AC
  Administered 2021-01-10: 0.25 mg via INTRAVENOUS
  Filled 2021-01-10: qty 5

## 2021-01-10 MED ORDER — DEXTROSE 5 % IV SOLN: Freq: Once | INTRAVENOUS | Status: AC

## 2021-01-10 MED ORDER — POTASSIUM CHLORIDE 20 MEQ PO PACK
40.0000 meq | PACK | Freq: Two times a day (BID) | ORAL | Status: AC
  Administered 2021-01-10 (×2): 40 meq via ORAL
  Filled 2021-01-10 (×2): qty 2

## 2021-01-10 MED ORDER — FLUOROURACIL CHEMO INJECTION 5 GM/100ML
2000.0000 mg/m2 | Freq: Once | INTRAVENOUS | Status: AC
  Administered 2021-01-10: 4800 mg via INTRAVENOUS
  Filled 2021-01-10: qty 96

## 2021-01-10 MED ORDER — COLD PACK MISC ONCOLOGY
1.0000 | Freq: Once | Status: AC | PRN
  Filled 2021-01-10: qty 1

## 2021-01-10 MED ORDER — FLUOROURACIL CHEMO INJECTION 2.5 GM/50ML
300.0000 mg/m2 | Freq: Once | INTRAVENOUS | Status: AC
  Administered 2021-01-10: 700 mg via INTRAVENOUS
  Filled 2021-01-10: qty 14

## 2021-01-10 MED ORDER — SODIUM CHLORIDE 0.9 % IV SOLN
10.0000 mg | Freq: Once | INTRAVENOUS | Status: AC
  Administered 2021-01-10: 10 mg via INTRAVENOUS
  Filled 2021-01-10: qty 1

## 2021-01-10 MED ORDER — LEUCOVORIN CALCIUM INJECTION 350 MG
300.0000 mg/m2 | Freq: Once | INTRAVENOUS | Status: AC
  Administered 2021-01-10: 718 mg via INTRAVENOUS
  Filled 2021-01-10: qty 35.9

## 2021-01-10 MED ORDER — HOT PACK MISC ONCOLOGY
1.0000 | Freq: Once | Status: AC | PRN
  Filled 2021-01-10: qty 1

## 2021-01-10 MED ORDER — OXALIPLATIN CHEMO INJECTION 100 MG/20ML
84.0000 mg/m2 | Freq: Once | INTRAVENOUS | Status: AC
  Administered 2021-01-10: 200 mg via INTRAVENOUS
  Filled 2021-01-10: qty 20

## 2021-01-10 NOTE — Progress Notes (Signed)
PROGRESS NOTE    Erin [name]  YBO:[id number] DOB: 02/23/1965 DOA: 12/16/2020 PCP: Pcp, No   Brief Narrative:  This 55 years old female with PMH significant for OSA on CPAP, HTN, DMII, recent diagnosis of ovarian anterolateral mass, morbid obesity who presented to [facility] ED with lower abdominal pain and lower extremity pain.  She has been followed by Dr. Berline Lopes with gynecology oncology for bilateral ovarian masses and endometrial mass.  Was prescribed oxycodone by gynecology oncology for her pain but it was not fully effective so she presented to the ED for further evaluation.  Work-up revealed rapid increase in the size of the left ovary with some adjacent ascites, masslike area of the cervix and potentially within the urethral diverticulum seen on CT abdomen and pelvis, not seen on prior MRI.  Left adrenal lesion measuring 3.2 x 2.6 cm, lucency in the spine concerning for metastatic disease.  MRI of the lumbar spine 12/16/2020 showed diffusely abnormal appearance of the marrow highly suspicious for diffuse osseous metastatic disease.  Retroperitoneal/iliac adenopathy with left larger than right adnexal masses.  Was taken to the OR on 12/17/2020 for EUA and cervical biopsies as well as diagnostic cystoscopy, bladder biopsy, endometrial biopsy.  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  Ongoing chemotherapy.  Plan to continue chemotherapy.  Next chemotherapy on 01/10/2021.  Hospital course complicated by drop in hemoglobin 6.8 K on 01/08/2021, 2 units PRBCs ordered to be transfused with patient's verbal consent.  01/10/2021: Seen at her bedside.  There were no acute events overnight.  Patient has no new complaints.  Plan for chemotherapy today.  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 MRI showed poorly marginated enhancing 3.7 x 2.8 x 3.0 cm mass centered at urethra concerning for malignancy -- solid avidly enhancing bilateral ovarian masses, largest 2.3 cm on Rt and 8.3 cm on Lt concerning for bilateral ovarian mets -- poorly marginated enhancing 2.4x2.3x3.0 cm mass in L uterine cervix, concerning for malignancy -- bilateral common iliac, bilateral external iliac, and bilateral inguinal LAD, concerning for metastatic disease -- diffuse patchy confluent nodular replacement of the pelvic osseus structures, concerning for osseus metastatic disease (see report) - Biopsy taken during cystoscopy on 12/17/2020 - biopsy has returned on 11/11. 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  gastric nodule bx and notable for adenocarcinoma, poorly differentiated with signet ring morphology.  Focal invastion of muscularis mucosa.  Muscularis propia not present in bx.  Per path reports, this most likely represents primary source of metastatic carcinoma dx on bx of vagina on 11/8. - Oncology following, now s/p port placement 11/17 Appreciate oncology's assistance.   Suspected metastatic gastric  cancer : Oncology awaiting molecular testing to determine treatment options. Cycle 1 folfox started ON 11/18 per oncology, had planned  to discharge to CIR on Monday after chemo complete. Patient need to continue chemotherapy which cannot be completed in CIR or in SNF.  Difficult placement.  TOC assisting.  Acute blood loss anemia in the setting of chemotherapy Hemoglobin dropped this morning 6.8, 2 units PRBC ordered to be transfused on 01/08/2021.  Repeat CBC posttransfusion. Transfused 1 unit PRBC previously during this admission.  Fever: > Resolved. Patient has spiked fever could be related to malignancy. Hold abx for now, consider starting if recurrent or spike > 101 F 1 set of cultures from 11/21 with gram positive cocci with staph species (suspect contaminant) Repeat blood cultures NGTD Urine culture contaminated. Repeat CXR with subsegmental atelectasis.  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  - gastric nodule bx and notable for adenocarcinoma, poorly differentiated with signet ring morphology.  Focal invastion of muscularis mucosa.  Muscularis propia not present in bx.  Per path reports, this most likely represents primary source of metastatic carcinoma dx on bx of vagina on 11/8. - Onco recommended Outpatient PET study as outpatient   Chronic pain syndrome: Continue long-acting oxycontin 40 mg twice daily. Reduce short acting oxycodone to 10 mg every 6 as needed Palliative care consulted for pain management. LE [place] for LE pain negative.  Trial flexeril.  Increase gabapentin. Continue pain control as needed   Panic attacks: Continue benzo as needed  Gastritis: EGD with gastritis PPI x 6 weeks then 40 mg daily Colonoscopy with 5 polyps removed (1 in ascending and 4 in sigmoid) bx as noted above   Ovarian mass: Noted on MRI pelvis Workup as noted above.   Acute lower UTI-resolved as of 12/24/2020 S/p ceftriaxone Culture with strep viridans resolved    Malnutrition of  moderate degree - Moderate malnutrition in context of acute illness/injury, morbid obesity - Evaluated by dietitian, appreciate assistance - per RD: "Moderate Malnutrition related to acute illness as evidenced by percent weight loss, moderate muscle depletion, energy intake < 75% for > 7 days."    OSA (obstructive sleep apnea) Continue CPAP.   Diabetes mellitus ([place]) Continue SSI   Essential hypertension BP is at goal Continue home regimen Continue amlodipine, coreg  Physical debility/ambulatory dysfunction Per CIR cannot do chemoinfusion on CIR. Continue PT OT with assistance and fall precautions     DVT prophylaxis: Lovenox subcu daily Code Status: Full code Family Communication: No family at bedside. Disposition Plan:   Status is: Inpatient  Remains inpatient appropriate because: Patient is medically clear.   Yes.  Awaiting CIR.  Patient need to continue chemotherapy which cannot be completed in CIR. TOC is working on finding SNF.  Patient is medically clear for discharge : Yes  Consultants:  Oncology IR Urology GI Palliative care D1 oncology  Procedures:  Diagnostic cystoscopy, bladder biopsy, urethral biopsy, 12/17/20 Cervical biopsies, 12/17/2020 EGD and colonoscopy 12/22/20 Port placement by IR 11/17   Antimicrobials:  Anti-infectives (From admission, onward)    Start     Dose/Rate Route Frequency Ordered Stop   12/23/20 1000  cefTRIAXone (ROCEPHIN) 1 g in sodium chloride 0.9 % 100 mL IVPB        1 g 200 mL/hr over 30 Minutes Intravenous Every 24 hours 12/23/20 0746 12/24/20 1030   12/20/20 1000  cefTRIAXone (ROCEPHIN) 1 g in sodium chloride  0.9 % 100 mL IVPB        1 g 200 mL/hr over 30 Minutes Intravenous Every 24 hours 12/20/20 0752 12/22/20 1451   12/17/20 0930  ceFAZolin (ANCEF) 3 g in dextrose 5 % 50 mL IVPB  Status:  Discontinued        3 g 100 mL/hr over 30 Minutes Intravenous  Once 12/17/20 0925 12/17/20 0926   12/17/20 0915  ceFAZolin  (ANCEF) IVPB 3g/100 mL premix        3 g 200 mL/hr over 30 Minutes Intravenous  Once 12/17/20 0914 12/17/20 0935        Objective: Vitals:   01/09/21 1305 01/09/21 2127 01/10/21 0510 01/10/21 1437  BP: 118/80 (!) 179/92 122/74 (!) 146/90  Pulse: 86 (!) 103 87 81  Resp: 15 20 17 18   Temp: 98.6 F (37 C) (!) 97.4 F (36.3 C) (!) 97.5 F (36.4 C) (!) 97.3 F (36.3 C)  TempSrc: Oral Oral Axillary Oral  SpO2: 94% 98% 96% 96%  Weight:      Height:        Intake/Output Summary (Last 24 hours) at 01/10/2021 1445 Last data filed at 01/10/2021 1056 Gross per 24 hour  Intake 240 ml  Output --  Net 240 ml   Filed Weights   12/16/20 1307 12/17/20 0841 12/22/20 0928  Weight: 127 kg 127 kg 127 kg    Examination: No significant changes from prior exam.  General exam: Appears well-developed well-nourished in no acute distress.  She is alert and oriented x3. Respiratory system: Clear to auscultation with no wheezes or rales.   Cardiovascular system: Regular rate and rhythm no rubs or gallops.  No JVD or thyromegaly noted.   Gastrointestinal system: Appears soft nondistended bowel sounds present.   Central nervous system: Alert and oriented x3.  Nonfocal exam.  Extremities: Trace edema in lower extremities bilaterally.   Skin: No rashes or ulcerative lesions. Psychiatry: Mood is appropriate for condition and setting.   Data Reviewed: I have personally reviewed following labs and imaging studies  CBC: Recent Labs  Lab 01/04/21 0500 01/05/21 0658 01/06/21 0625 01/08/21 0451 01/08/21 0830 01/09/21 0408  WBC 5.8 6.4 6.5 6.4  --  6.1  NEUTROABS  --   --   --   --   --  3.6  HGB 7.1* 7.4* 7.4* 6.8* 6.8* 8.7*  HCT 22.8* 23.3* 23.7* 22.1* 22.1* 27.0*  MCV 95.4 95.1 96.7 96.5  --  93.1  PLT 147* 152 150 186  --  660   Basic Metabolic Panel: Recent Labs  Lab 01/04/21 0500 01/08/21 0451 01/10/21 0449  NA 137 138 136  K 3.8 3.6 3.3*  CL 104 106 104  CO2 25 25 25   GLUCOSE  122* 111* 113*  BUN 14 16 14   CREATININE 0.68 0.77 0.81  CALCIUM 9.1 9.2 9.2  MG  --  2.1  --   PHOS  --  3.4  --    GFR: Estimated Creatinine Clearance: 104.8 mL/min (by C-G formula based on SCr of 0.81 mg/dL). Liver Function Tests: Recent Labs  Lab 01/10/21 0449  AST 17  ALT 15  ALKPHOS 476*  BILITOT 0.8  PROT 5.9*  ALBUMIN 2.3*    No results for input(s): LIPASE, AMYLASE in the last 168 hours. No results for input(s): AMMONIA in the last 168 hours. Coagulation Profile: No results for input(s): INR, PROTIME in the last 168 hours.  Cardiac Enzymes: No results for input(s): CKTOTAL, CKMB, CKMBINDEX, TROPONINI  in the last 168 hours. BNP (last 3 results) No results for input(s): PROBNP in the last 8760 hours. HbA1C: No results for input(s): HGBA1C in the last 72 hours. CBG: Recent Labs  Lab 01/09/21 1235 01/09/21 1731 01/09/21 2119 01/10/21 0808 01/10/21 1153  GLUCAP 152* 106* 115* 128* 167*   Lipid Profile: No results for input(s): CHOL, HDL, LDLCALC, TRIG, CHOLHDL, LDLDIRECT in the last 72 hours. Thyroid Function Tests: No results for input(s): TSH, T4TOTAL, FREET4, T3FREE, THYROIDAB in the last 72 hours. Anemia Panel: No results for input(s): VITAMINB12, FOLATE, FERRITIN, TIBC, IRON, RETICCTPCT in the last 72 hours. Sepsis Labs: No results for input(s): PROCALCITON, LATICACIDVEN in the last 168 hours.  No results found for this or any previous visit (from the past 240 hour(s)).   Radiology Studies: No results found.  Scheduled Meds:  amLODipine  5 mg Oral Daily   carvedilol  6.25 mg Oral BID WC   Chlorhexidine Gluconate Cloth  6 each Topical Daily   enoxaparin (LOVENOX) injection  60 mg Subcutaneous Q24H   feeding supplement  237 mL Oral TID BM   FLUOROURACIL (ADRUCIL) CHEMO infusion For Inpatient Use  2,000 mg/m2 (Treatment Plan Recorded) Intravenous Once   fluorouracil  300 mg/m2 (Treatment Plan Recorded) Intravenous Once   gabapentin  300 mg Oral  QHS   insulin aspart  0-9 Units Subcutaneous TID WC   leucovorin (WELLCOVORIN) IV infusion  300 mg/m2 (Treatment Plan Recorded) Intravenous Once   oxaliplatin (ELOXATIN) CHEMO IV infusion  84 mg/m2 (Treatment Plan Recorded) Intravenous Once   oxyCODONE  40 mg Oral Q12H   pantoprazole  40 mg Oral BID   polyethylene glycol  17 g Oral BID   senna-docusate  2 tablet Oral BID   sodium chloride flush  10-40 mL Intracatheter Q12H   Continuous Infusions:  dextrose       LOS: 25 days    Time spent: 25 mins    Kayleen Memos, MD [facility]   If 7PM-7AM, please contact night-coverage

## 2021-01-10 NOTE — Progress Notes (Addendum)
HEMATOLOGY-ONCOLOGY PROGRESS NOTE  SUBJECTIVE: Pain is controlled.  No nausea or vomiting.  No bleeding reported.  PHYSICAL EXAMINATION:  Vitals:   01/09/21 2127 01/10/21 0510  BP: (!) 179/92 122/74  Pulse: (!) 103 87  Resp: 20 17  Temp: (!) 97.4 F (36.3 C) (!) 97.5 F (36.4 C)  SpO2: 98% 96%   Filed Weights   12/16/20 1307 12/17/20 0841 12/22/20 0928  Weight: 127 kg 127 kg 127 kg    Intake/Output from previous day: 12/01 0701 - 12/02 0700 In: 240 [P.O.:240] Out: -  HEENT: No thrush ABDOMEN: Soft, tender with mild firmness in the left mid and lower abdomen NEURO: alert & oriented x 3 with fluent speech, no focal motor/sensory deficits Vascular: No leg edema Skin: Palms without erythema Lungs: Clear bilaterally Cardiac: Regular rate and rhythm  Port-A-Cath without erythema  LABORATORY DATA:  I have reviewed the data as listed CMP Latest Ref Rng & Units 01/10/2021 01/08/2021 01/04/2021  Glucose 70 - 99 mg/dL 113(H) 111(H) 122(H)  BUN 6 - 20 mg/dL $Remove'14 16 14  'mpOLJwK$ Creatinine 0.44 - 1.00 mg/dL 0.81 0.77 0.68  Sodium 135 - 145 mmol/L 136 138 137  Potassium 3.5 - 5.1 mmol/L 3.3(L) 3.6 3.8  Chloride 98 - 111 mmol/L 104 106 104  CO2 22 - 32 mmol/L $RemoveB'25 25 25  'WFThrVOX$ Calcium 8.9 - 10.3 mg/dL 9.2 9.2 9.1  Total Protein 6.5 - 8.1 g/dL 5.9(L) - -  Total Bilirubin 0.3 - 1.2 mg/dL 0.8 - -  Alkaline Phos 38 - 126 U/L 476(H) - -  AST 15 - 41 U/L 17 - -  ALT 0 - 44 U/L 15 - -    Lab Results  Component Value Date   WBC 6.1 01/09/2021   HGB 8.7 (L) 01/09/2021   HCT 27.0 (L) 01/09/2021   MCV 93.1 01/09/2021   PLT 185 01/09/2021   NEUTROABS 3.6 01/09/2021    DG Chest 2 View  Result Date: 12/16/2020 CLINICAL DATA:  Shortness of breath in a 55 year old female. EXAM: CHEST - 2 VIEW COMPARISON:  July 10, 2009. FINDINGS: Linear opacities in the LEFT chest of progressed slightly since the previous study. No lobar consolidation. Cardiomediastinal contours and hilar structures are stable. No sign  of effusion. Mildly increased density in the subcarinal region on lateral projection. No acute skeletal process on limited assessment. IMPRESSION: Signs of scarring or atelectasis in the LEFT chest, slightly progressed since the previous study. Question developing subcarinal adenopathy, based on lateral projection. Developing lower lobe airspace disease is also a differential consideration. In a patient with pelvic masses on recent pelvic MRI would consider follow-up chest CT for further evaluation. Electronically Signed   By: Zetta Bills M.D.   On: 12/16/2020 14:13   CT CHEST W CONTRAST  Result Date: 12/17/2020 CLINICAL DATA:  Cancer of unknown primary.  Staging. EXAM: CT CHEST WITH CONTRAST TECHNIQUE: Multidetector CT imaging of the chest was performed during intravenous contrast administration. CONTRAST:  3mL OMNIPAQUE IOHEXOL 350 MG/ML SOLN COMPARISON:  Chest CT dated 06/01/2007. FINDINGS: Cardiovascular: There is no cardiomegaly or pericardial effusion. Three-vessel coronary vascular calcification. Mild atherosclerotic calcification of the thoracic aorta. No aneurysmal dilatation. The origins of the great vessels of the aortic arch appear patent as visualized. There is dilatation of the main pulmonary trunk suggestive of pulmonary hypertension. Evaluation of the pulmonary arteries is limited due to respiratory motion artifact and suboptimal opacification and timing of the contrast. No large or central pulmonary artery embolus identified. Mediastinum/Nodes: No hilar  or mediastinal adenopathy. The esophagus is grossly unremarkable. Prior right hemithyroidectomy. No mediastinal fluid collection. Lungs/Pleura: Bibasilar subpleural atelectasis/scarring. There is no pleural effusion pneumothorax. The central airways are patent. Upper Abdomen: Indeterminate 3 cm left renal nodule, present on the prior CT, likely a benign etiology such as adenoma. Musculoskeletal: Degenerative changes of the spine. No acute  osseous pathology. IMPRESSION: 1. No acute intrathoracic pathology. No CT evidence of central pulmonary artery embolus. 2. Dilatation of the main pulmonary trunk suggestive of pulmonary hypertension. 3. Status post prior right hemithyroidectomy. 4. Bilateral linear atelectasis/scarring. 5. Aortic Atherosclerosis (ICD10-I70.0). Electronically Signed   By: Anner Crete M.D.   On: 12/17/2020 21:09   MR Lumbar Spine W Wo Contrast  Result Date: 12/16/2020 CLINICAL DATA:  Initial evaluation for low back pain, cancer suspected. EXAM: MRI LUMBAR SPINE WITHOUT AND WITH CONTRAST TECHNIQUE: Multiplanar and multiecho pulse sequences of the lumbar spine were obtained without and with intravenous contrast. CONTRAST:  15mL GADAVIST GADOBUTROL 1 MMOL/ML IV SOLN COMPARISON:  Prior CT from earlier the same day. FINDINGS: Segmentation: Standard. Lowest well-formed disc space labeled the L5-S1 level. Alignment: 4 mm facet mediated anterolisthesis of L4 on L5. Alignment otherwise normal with preservation of the normal lumbar lordosis. Vertebrae: Diffusely abnormal appearance of the bone marrow is seen throughout the visualized lumbar spine and pelvis. Associated heterogeneous STIR hyperintensity with irregular heterogeneous postcontrast enhancement. Findings are highly suspicious for diffuse osseous metastatic disease. Involvement appears to be most pronounced within the T12 vertebral body as well as the visualized pelvis. Exact measurements of a discrete lesion is difficult given the overall infiltrative appearance of this finding. No associated pathologic fracture. No visible extra osseous extension of tumor at this time. Conus medullaris and cauda equina: Conus extends to the L1 level. Conus and cauda equina appear normal. No visible epidural or intracanalicular tumor. Paraspinal and other soft tissues: Mild edema within the subcutaneous fat of the lower back, which could be related to overall volume status. Paraspinous soft  tissues demonstrate no other acute finding. Retroperitoneal/iliac adenopathy noted. Left larger than right adnexal masses partially visualized. Findings better characterized on prior CT. Disc levels: L1-2:  Unremarkable. L2-3: Disc desiccation with mild disc bulge. Superimposed small right foraminal to extraforaminal disc protrusion (series 11, image 17). Mild facet hypertrophy. Underlying short pedicles with a degree of mild spinal stenosis. Mild bilateral L2 foraminal narrowing. L3-4: Disc desiccation with mild disc bulge. Superimposed shallow left extraforaminal disc protrusion with annular fissure (series 11, image 24). Mild facet hypertrophy. Underlying short pedicles. Mild spinal stenosis. Foramina remain patent. L4-5: 4 mm anterolisthesis. Disc desiccation with broad-based posterior pseudo disc bulge. Biforaminal annular fissures noted, left larger than right. Moderate bilateral facet arthrosis. Resultant moderate spinal stenosis. Mild to moderate bilateral L4 foraminal narrowing. L5-S1: Normal interspace. Mild right greater than left facet hypertrophy. No stenosis. IMPRESSION: 1. 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 No associated pathologic fracture or extra osseous extension of tumor at this time. 2. Retroperitoneal/iliac adenopathy with left larger than right adnexal masses, better characterized on prior CT. 3. Underlying multilevel degenerative spondylosis as above, most pronounced at L4-5 where there is resultant moderate spinal stenosis. Electronically Signed   By: Jeannine Boga M.D.   On: 12/16/2020 23:44   US Pelvis Complete  Result Date: 12/16/2020 CLINICAL DATA:  Pelvic pain EXAM: TRANSABDOMINAL ULTRASOUND OF PELVIS DOPPLER ULTRASOUND OF OVARIES TECHNIQUE: Transabdominal ultrasound examination of the pelvis was  performed including evaluation of the uterus, ovaries,  adnexal regions, and pelvic cul-de-sac. Color and duplex Doppler ultrasound was utilized to evaluate blood flow to the ovaries. COMPARISON:  CT 12/16/2020, MRI 12/05/2020, pelvic ultrasound 12/01/2020 FINDINGS: Uterus Measurements: 6.8 x 3.4 x 5 cm = volume: 60.5 mL. No fibroids or other mass visualized. Endometrium Poorly visible, unable to measure. Right ovary Measurements: 4.8 x 2.9 x 5.5 cm = volume: 38.6 mL. Solid right adnexal mass replaces most of right ovary. Left ovary Measurements: 10.5 x 8.7 x 9.2 cm = volume: 440 mL. Enlarged left ovary with heterogenous solid mass that replaces the left ovary. Previously the left ovary measured 8.3 x 7.7 x 8.5 cm on MRI. Pulsed Doppler evaluation demonstrates normal arterial and venous waveforms in both or adnexa, though note that both ovaries are essentially replaced by mass lesions. Other: No significant ascites. Foley catheter within the bladder which appears somewhat thick walled. IMPRESSION: 1. Very limited exam secondary to habitus and only trans abdominal technique 2. Bilateral solid adnexal masses essentially replacing the ovaries which thereby limits assessment of the ovaries. Left adnexal solid mass lesion has significantly increased in size; documented flow in the adnexa relates to flow within the solid adnexal masses. Gynecology follow-up recommended. Electronically Signed   By: Donavan Foil M.D.   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 EXAM: CT ABDOMEN AND PELVIS WITH CONTRAST TECHNIQUE: Multidetector CT imaging of the abdomen and pelvis was performed using the standard protocol following bolus administration of intravenous contrast. CONTRAST:  21mL OMNIPAQUE IOHEXOL 350 MG/ML SOLN COMPARISON:  Comparison is made with multiple prior studies which were performed recently, most recent comparison  is an MRI of the pelvis of December 05, 2020. FINDINGS: Lower chest: Basilar atelectasis. Subcarinal region not well evaluated. No consolidation or effusion at the lung bases. Hepatobiliary: No focal, suspicious hepatic lesion. No pericholecystic stranding. Portal vein is patent. No biliary duct dilation. Pancreas: Pancreas with normal contours. No signs of adjacent inflammation. Spleen: Spleen normal size and contour. Adrenals/Urinary Tract: Well-circumscribed LEFT adrenal lesion, density value of 19 Hounsfield units on previous noncontrast imaging measuring 3.2 x 2.6 cm., relative washout is indeterminate at 30%. Normal RIGHT adrenal. Normal, symmetric enhancement of bilateral kidneys without focal renal lesion. Stomach/Bowel: Small hiatal hernia. No stranding adjacent to the stomach. No sign of small bowel obstruction or acute small bowel process. The appendix is normal. Stool in various parts of the colon without signs of obstruction or adjacent stranding. Vascular/Lymphatic: Retroperitoneal adenopathy (image 50/2) 14 mm lymph node previously 11 mm along the LEFT common iliac chain. LEFT pelvic sidewall lymph node (image 75/2) 14 mm short axis, previously 13 mm. Similar size of RIGHT pelvic sidewall/external iliac lymph nodes also with little change compared to the most recent comparison imaging. Reproductive: Foley catheter in situ. Uterus and cervix not well at assess nor are the masses within the area of the cervix and urethra seen on previous MRI. Increasing size of LEFT ovary with heterogeneous appearance now measuring 10 x 8.8 cm. When assessed on the study of October 20, 2020 the ovary for reference measured 3.7 x 2.5 cm. RIGHT ovary with area of enhancement shows enlargement as well but not as pronounced as on previous imaging with 2.8 cm area of enhancement in the RIGHT ovary as the largest discrete area which is similar to the  recent MRI evaluation. As compared to the study of October 20, 2020 there  is little change in the appearance of the RIGHT ovary aside from this area though there is an increasingly nodular appearance of the ovary in this location. Mass posterior to the LEFT ovary just above the uterus measuring 4.4 x 2.8 cm has enlarged from 2.5 cm. Other: Small volume ascites. Musculoskeletal: Heterogeneous pattern associated with the spine in terms of density raises the question of metastatic disease without discrete measurable focal lesion in the spine. For example a geographic area of sclerosis on image 101/5) in the anterior L3 vertebral body does raise the question of underlying lesion with numerous additional foci, essentially nearly all levels of the spine with some degree of heterogeneity. Another example of this finding is noted on the RIGHT at the T10 level. No acute bone finding or frankly destructive bone process. IMPRESSION: Rapid increase in size of the LEFT ovary with some adjacent ascites. The rapid enlargement is seen in the setting of generalized worsening of masses associated with the LEFT and RIGHT ovary but perhaps more pronounced than other areas in the pelvis. The possibility of torsion associated with an ovarian mass could be considered based on this finding. Would correlate with any worsening abdominal pain and with sonogram as warranted for further assessment. Enlarging masses elsewhere though not to the extent that is seen with the LEFT ovary. Masslike area of the cervix and potentially within a urethral diverticulum not as well seen as on the recent MRI evaluation. Small volume ascites. Well-circumscribed LEFT adrenal lesion measuring 3.2 x 2.6 cm, density value of 19 Hounsfield units on previous noncontrast imaging. Relative washout is indeterminate at 30%. This may represent an adrenal adenoma. Consider dedicated adrenal protocol CT or attention on PET evaluation if performed. Heterogeneous pattern of subtle sclerosis and lucency in the spine on today's study raising the  question of bony metastatic disease in the spine. Findings of increased ovarian size on the LEFT out of proportion other areas were called by telephone at the time of interpretation on 12/16/2020 at 5:44 pm to provider Theodis Blaze , who verbally acknowledged these results. Electronically Signed   By: Zetta Bills M.D.   On: 12/16/2020 17:46   [place] Art/Ven Flow Abd Pelv Doppler  Result Date: 12/16/2020 CLINICAL DATA:  Pelvic pain EXAM: TRANSABDOMINAL ULTRASOUND OF PELVIS DOPPLER ULTRASOUND OF OVARIES TECHNIQUE: Transabdominal ultrasound examination of the pelvis was performed including evaluation of the uterus, ovaries, adnexal regions, and pelvic cul-de-sac. Color and duplex Doppler ultrasound was utilized to evaluate blood flow to the ovaries. COMPARISON:  CT 12/16/2020, MRI 12/05/2020, pelvic ultrasound 12/01/2020 FINDINGS: Uterus Measurements: 6.8 x 3.4 x 5 cm = volume: 60.5 mL. No fibroids or other mass visualized. Endometrium Poorly visible, unable to measure. Right ovary Measurements: 4.8 x 2.9 x 5.5 cm = volume: 38.6 mL. Solid right adnexal mass replaces most of right ovary. Left ovary Measurements: 10.5 x 8.7 x 9.2 cm = volume: 440 mL. Enlarged left ovary with heterogenous solid mass that replaces the left ovary. Previously the left ovary measured 8.3 x 7.7 x 8.5 cm on MRI. Pulsed Doppler evaluation demonstrates normal arterial and venous waveforms in both or adnexa, though note that both ovaries are essentially replaced by mass lesions. Other: No significant ascites. Foley catheter within the bladder which appears somewhat thick walled. IMPRESSION: 1. Very limited exam secondary to habitus and only trans abdominal technique 2. Bilateral solid adnexal masses essentially replacing the ovaries which thereby limits  assessment of the ovaries. Left adnexal solid mass lesion has significantly increased in size; documented flow in the adnexa relates to flow within the solid adnexal masses. Gynecology follow-up  recommended. Electronically Signed   By: Donavan Foil M.D.   On: 12/16/2020 19:23   DG CHEST PORT 1 VIEW  Result Date: 12/31/2020 CLINICAL DATA:  Chest pain EXAM: PORTABLE CHEST 1 VIEW COMPARISON:  Previous studies including the examination of 12/30/2020 FINDINGS: Transverse diameter of heart is increased. There are no signs of pulmonary edema or focal pulmonary consolidation. There are small linear densities in both mid lung fields and left lower lung fields suggesting subsegmental atelectasis with interval improvement in the left lung and possible slight worsening in the right mid lung fields. IMPRESSION: Cardiomegaly. There are no signs of pulmonary edema or focal pulmonary consolidation. There are small linear densities in the both mid and left lower lung fields suggesting subsegmental atelectasis with some improvement in the left lung. Electronically Signed   By: Elmer Picker M.D.   On: 12/31/2020 13:39   DG CHEST PORT 1 VIEW  Result Date: 12/30/2020 CLINICAL DATA:  Fever, abdominal pain EXAM: PORTABLE CHEST 1 VIEW COMPARISON:  Previous studies including the CT chest done on 12/17/2020 FINDINGS: Transverse diameter of heart is increased. There are linear densities in both parahilar regions and medial left lower lung fields suggesting scarring or subsegmental atelectasis with interval worsening in the left lower lung fields. There is no significant pleural effusion or pneumothorax. Surgical clips are seen in the right thyroid bed. IMPRESSION: Cardiomegaly. There are linear densities in both parahilar regions and left lower lung fields with interval worsening suggesting subsegmental atelectasis. There are no signs of alveolar pulmonary edema or focal pulmonary consolidation. Electronically Signed   By: Elmer Picker M.D.   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 EXAM: IMPLANTED PORT A CATH PLACEMENT WITH ULTRASOUND AND FLUOROSCOPIC GUIDANCE COMPARISON:  None. MEDICATIONS: None. ANESTHESIA/SEDATION: Moderate (conscious) sedation was employed during this procedure. A total of Versed 3 mg and Fentanyl 100 mcg was administered intravenously. Moderate Sedation Time: 19 minutes. The patient's level of consciousness and vital signs were monitored continuously by radiology nursing throughout the procedure under my direct supervision. CONTRAST:  None FLUOROSCOPY TIME:  0 minutes, 12 seconds (5 mGy) COMPLICATIONS: None immediate. PROCEDURE: The procedure, risks, benefits, and alternatives were explained to the patient. Questions regarding the procedure were encouraged and answered. The patient understands and consents to the procedure. The right neck and chest were prepped with chlorhexidine in a sterile fashion, and a sterile drape was applied covering the operative field. Maximum barrier sterile technique with sterile gowns and gloves were used for the procedure. A timeout was performed prior to the initiation of the procedure. Ultrasound was used to examine the jugular vein which was compressible and free of internal echoes. A skin marker was used to demarcate the planned venotomy and port pocket incision sites. Local anesthesia was provided to these sites and the subcutaneous tunnel track with 1% lidocaine with 1:100,000 epinephrine. A small incision was created at the jugular access site and blunt dissection was performed of the subcutaneous tissues. Under ultrasound guidance, the jugular vein was accessed with a 21 ga micropuncture needle and an 0.018" wire was inserted to the superior vena cava. Real-time ultrasound guidance was utilized for vascular access including the acquisition  of a permanent ultrasound image documenting patency of the accessed vessel. A 5 Fr micopuncture set was then used, through which a  0.035" Rosen wire was passed under fluoroscopic guidance into the inferior vena cava. An 8 Fr dilator was then placed over the wire. A subcutaneous port pocket was then created along the upper chest wall utilizing a combination of sharp and blunt dissection. The pocket was irrigated with sterile saline, packed with gauze, and observed for hemorrhage. A single lumen "ISP" sized power injectable port was chosen for placement. The 8 Fr catheter was tunneled from the port pocket site to the venotomy incision. The port was placed in the pocket. The external catheter was trimmed to appropriate length. The dilator was exchanged for an 8 Fr peel-away sheath under fluoroscopic guidance. The catheter was then placed through the sheath and the sheath was removed. Final catheter positioning was confirmed and documented with a fluoroscopic spot radiograph. The port was accessed with a Huber needle, aspirated, and flushed with heparinized saline. The deep dermal layer of the port pocket incision was closed with interrupted 3-0 Vicryl suture. The skin was opposed with a running subcuticular 4-0 Monocryl suture. Dermabond was then placed over the port pocket and neck incisions. The patient tolerated the procedure well without immediate post procedural complication. FINDINGS: After catheter placement, the tip lies within the superior cavoatrial junction. The catheter aspirates and flushes normally and is ready for immediate use. IMPRESSION: Successful placement of a power injectable Port-A-Cath via the right internal jugular vein. The catheter is ready for immediate use. Ruthann Cancer, MD [facility] Radiology Electronically Signed   By: Ruthann Cancer M.D.   On: 12/26/2020 11:08   VAS [place] LOWER EXTREMITY VENOUS (DVT)  Result Date: 12/29/2020  Lower Venous DVT Study Patient Name:  KAMALJIT HIZER Hoar  Date of Exam:   12/29/2020 Medical Rec #: [id number]            Accession #:     [id number] Date of Birth: 07-24-1965            Patient Gender: F Patient Age:   34 years Exam Location:  [facility] [place] Procedure:      VAS [place] LOWER EXTREMITY VENOUS (DVT) Referring Phys: A POWELL JR --------------------------------------------------------------------------------  Indications: Pain.  Risk Factors: Cancer. Limitations: Body habitus and poor ultrasound/tissue interface. Comparison Study: No prior studies. Performing Technologist: Oliver Hum RVT  Examination Guidelines: A complete evaluation includes B-mode imaging, spectral Doppler, color Doppler, and power Doppler as needed of all accessible portions of each vessel. Bilateral testing is considered an integral part of a complete examination. Limited examinations for reoccurring indications may be performed as noted. The reflux portion of the exam is performed with the patient in reverse Trendelenburg.  +---------+---------------+---------+-----------+----------+--------------+ RIGHT    CompressibilityPhasicitySpontaneityPropertiesThrombus Aging +---------+---------------+---------+-----------+----------+--------------+ CFV      Full           Yes      Yes                                 +---------+---------------+---------+-----------+----------+--------------+ SFJ      Full                                                        +---------+---------------+---------+-----------+----------+--------------+  FV Prox  Full                                                        +---------+---------------+---------+-----------+----------+--------------+ FV Mid   Full                                                        +---------+---------------+---------+-----------+----------+--------------+ FV DistalFull                                                        +---------+---------------+---------+-----------+----------+--------------+ PFV      Full                                                         +---------+---------------+---------+-----------+----------+--------------+ POP      Full           Yes      Yes                                 +---------+---------------+---------+-----------+----------+--------------+ PTV      Full                                                        +---------+---------------+---------+-----------+----------+--------------+ PERO     Full                                                        +---------+---------------+---------+-----------+----------+--------------+   +---------+---------------+---------+-----------+----------+--------------+ LEFT     CompressibilityPhasicitySpontaneityPropertiesThrombus Aging +---------+---------------+---------+-----------+----------+--------------+ CFV      Full           Yes      Yes                                 +---------+---------------+---------+-----------+----------+--------------+ SFJ      Full                                                        +---------+---------------+---------+-----------+----------+--------------+ FV Prox  Full                                                        +---------+---------------+---------+-----------+----------+--------------+  FV Mid   Full                                                        +---------+---------------+---------+-----------+----------+--------------+ FV DistalFull                                                        +---------+---------------+---------+-----------+----------+--------------+ PFV      Full                                                        +---------+---------------+---------+-----------+----------+--------------+ POP      Full           Yes      Yes                                 +---------+---------------+---------+-----------+----------+--------------+ PTV      Full                                                         +---------+---------------+---------+-----------+----------+--------------+ PERO     Full                                                        +---------+---------------+---------+-----------+----------+--------------+     Summary: RIGHT: - There is no evidence of deep vein thrombosis in the lower extremity.  - No cystic structure found in the popliteal fossa.  LEFT: - There is no evidence of deep vein thrombosis in the lower extremity.  - No cystic structure found in the popliteal fossa.  *See table(s) above for measurements and observations. Electronically signed by Jamelle Haring on 12/29/2020 at 12:00:16 PM.    Final     ASSESSMENT AND PLAN: 1.  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 -Bladder neck, urethra, cervical biopsies 12/17/2020 -MSS, tumor mutation burden 4, K-ras amplification, PD-L1 tumor proportion score-0 -[facility] -90% intestinal malignancy (colorectal adenocarcinoma 85%) small intestine adenocarcinoma less than 5%, gastroesophageal adenocarcinoma not excluded -12/05/2020 MRI of the pelvis-poorly marginated enhancing 3.7 x 2.8 x 3.0 cm mass centered at the urethra, solid avidly enhancing bilateral ovarian masses, poorly marginated enhancing 2.4 x 2.3 x 3.0 cm mass in the left uterine cervix, mild to moderate bilateral common iliac, bilateral external iliac, and bilateral inguinal lymphadenopathy, diffuse patchy confluent nodular replacement of the pelvic osseous structures. -12/13/2020 CEA 1864, CA125 29.2 -12/16/2020 CT abdomen/pelvis-rapidly increasing size of the left ovary with some adjacent ascites, enlarging masses elsewhere, masslike area of the cervix and potentially within a urethral diverticulum, well-circumscribed left adrenal lesion measuring 3.2 x 2.6 cm, heterogeneous pattern of  subtle sclerosis and lucency in the spine. -12/16/2020 MRI of the lumbar spine-diffusely abnormal appearance of the bone marrow throughout the  visualized lumbar spine and pelvis highly suspicious for diffuse osseous metastatic disease, retroperitoneal/iliac adenopathy with left larger than right adnexal masses. -12/17/2020 CT chest-no acute intrathoracic pathology -Upper endoscopy 12/22/2020-gastritis, gastric nodule biopsy-adenocarcinoma, poorly differentiated with signet ring morphology -Colonoscopy 12/22/2020-polyps removed from the ascending and sigmoid colon, extrinsic compression of the sigmoid colon-tubulovillous adenoma without high-grade dysplasia, tubular adenoma and hyperplastic polyps -12/27/2020 cycle #1 FOLFOX -01/10/2021 cycle #2 FOLFOX 2.  Abdominal pain secondary #1 3.  Constipation 4.  Normocytic anemia 5.  Leukocytosis 6.  Protein calorie malnutrition 7.  Obstructive sleep apnea 8.  Diabetes mellitus 9.  Hypertension 10.  Possible pulmonary hypertension noted on CT chest  Ms. Davisson appears unchanged.  She is due for cycle #2 of her chemotherapy today.  CBC performed yesterday is stable and adequate for treatment today. CMET performed this morning reviewed which shows mild hypokalemia but otherwise stable and adequate to proceed with treatment today.  We again reviewed potential side effects and she agrees to proceed with cycle #2 today.  She does not need daily lab work.  Pain is adequately controlled at this time.  She is using oxycodone routinely in addition to OxyContin.  If she is using less breakthrough pain medication, would recommend decreasing dose of OxyContin.  Continue bowel regimen.  Disposition planning per case management.  We will arrange for outpatient follow-up at the cancer center following hospital discharge.  Recommendations: 1.  Continue current pain medication and will consider decreasing OxyContin if breakthrough pain medication use decreases. 2.  Proceed with cycle #2 of chemotherapy as scheduled today. 3.  Plan for outpatient PET scan  4.  Molecular testing results noted above 5.  No  need for daily lab work from our standpoint. 6.  Continue bowel regimen. 7.  Replete potassium per hospitalist. 8.  Please call oncology over the weekend as needed  We will check on her again on Monday if she remains in the hospital.  Okay to discharge from our standpoint following chemotherapy.  Future Appointments  Date Time Provider [place]  02/18/2021 10:45 AM [name], PA-C [facility]      LOS: 25 days   [name] 01/10/21 Ms. Nathanson was interviewed and examined.  She appears stable to complete cycle 2 FOLFOX beginning today.  Her labs are adequate for chemotherapy.  I will check on her 01/13/2021.  She will be stable for discharge from an oncology standpoint following completion of chemotherapy.  I was present for greater than 50% of today's visit.  I performed medical decision making.

## 2021-01-11 LAB — CBC
HCT: 27.6 % — ABNORMAL LOW (ref 36.0–46.0)
Hemoglobin: 8.7 g/dL — ABNORMAL LOW (ref 12.0–15.0)
MCH: 29.6 pg (ref 26.0–34.0)
MCHC: 31.5 g/dL (ref 30.0–36.0)
MCV: 93.9 fL (ref 80.0–100.0)
Platelets: 244 10*3/uL (ref 150–400)
RBC: 2.94 MIL/uL — ABNORMAL LOW (ref 3.87–5.11)
RDW: 15.3 % (ref 11.5–15.5)
WBC: 5.7 10*3/uL (ref 4.0–10.5)
nRBC: 1.2 % — ABNORMAL HIGH (ref 0.0–0.2)

## 2021-01-11 LAB — BASIC METABOLIC PANEL
Anion gap: 6 (ref 5–15)
BUN: 12 mg/dL (ref 6–20)
CO2: 25 mmol/L (ref 22–32)
Calcium: 9.6 mg/dL (ref 8.9–10.3)
Chloride: 106 mmol/L (ref 98–111)
Creatinine, Ser: 0.74 mg/dL (ref 0.44–1.00)
GFR, Estimated: >60 mL/min (ref >60)
Glucose, Bld: 122 mg/dL — ABNORMAL HIGH (ref 70–99)
Potassium: 4 mmol/L (ref 3.5–5.1)
Sodium: 137 mmol/L (ref 135–145)

## 2021-01-11 LAB — GLUCOSE, CAPILLARY
Glucose-Capillary: 112 mg/dL — ABNORMAL HIGH (ref 70–99)
Glucose-Capillary: 131 mg/dL — ABNORMAL HIGH (ref 70–99)
Glucose-Capillary: 144 mg/dL — ABNORMAL HIGH (ref 70–99)
Glucose-Capillary: 127 mg/dL — ABNORMAL HIGH (ref 70–99)

## 2021-01-11 LAB — MAGNESIUM: Magnesium: 2 mg/dL (ref 1.7–2.4)

## 2021-01-11 MED ORDER — POTASSIUM CHLORIDE CRYS ER 20 MEQ PO TBCR
40.0000 meq | EXTENDED_RELEASE_TABLET | Freq: Once | ORAL | Status: AC
  Administered 2021-01-11: 40 meq via ORAL
  Filled 2021-01-11: qty 2

## 2021-01-11 NOTE — Progress Notes (Signed)
RT went to check on pt to see if she is ready. Pt states she will place herself on the cpap. Cpap plugged and ready to be use.

## 2021-01-11 NOTE — Progress Notes (Signed)
PROGRESS NOTE    Erin [name]  EXH:[id number] DOB: [birth year] DOA: 12/16/2020 PCP: Pcp, No   Brief Narrative:  This 55 years old female with PMH significant for OSA on CPAP, HTN, DMII, recent diagnosis of ovarian anterolateral mass, morbid obesity who presented to [facility] ED with lower abdominal pain and lower extremity pain.  She has been followed by Dr. Berline Lopes with gynecology oncology for bilateral ovarian masses and endometrial mass.  Was prescribed oxycodone by gynecology oncology for her pain but it was not fully effective so she presented to the ED for further evaluation.  Work-up revealed rapid increase in the size of the left ovary with some adjacent ascites, masslike area of the cervix and potentially within the urethral diverticulum seen on CT abdomen and pelvis, not seen on prior MRI.  Left adrenal lesion measuring 3.2 x 2.6 cm, lucency in the spine concerning for metastatic disease.  MRI of the lumbar spine 12/16/2020 showed diffusely abnormal appearance of the marrow highly suspicious for diffuse osseous metastatic disease.  Retroperitoneal/iliac adenopathy with left larger than right adnexal masses.  Was taken to the OR on 12/17/2020 for EUA and cervical biopsies as well as diagnostic cystoscopy, bladder biopsy, endometrial biopsy.  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  Ongoing chemotherapy.  Plan to continue chemotherapy. Last chemotherapy session on 01/10/2021.  Hospital course complicated by drop in hemoglobin 6.8 K on 01/08/2021, 2 units PRBCs ordered to be transfused with patient's verbal consent.  01/11/2021: Seen and examined at bedside.  Reports her abdominal pain from last night is improved this morning.  No nausea or vomiting this morning.  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 MRI showed poorly marginated enhancing 3.7 x 2.8 x 3.0 cm mass centered at urethra concerning for malignancy -- solid avidly enhancing bilateral ovarian masses, largest 2.3 cm on Rt and 8.3 cm on Lt concerning for bilateral ovarian mets -- poorly marginated enhancing 2.4x2.3x3.0 cm mass in L uterine cervix, concerning for malignancy -- bilateral common iliac, bilateral external iliac, and bilateral inguinal LAD, concerning for metastatic disease -- diffuse patchy confluent nodular replacement of the pelvic osseus structures, concerning for osseus metastatic disease (see report) - Biopsy taken during cystoscopy on 12/17/2020 - biopsy has returned on 11/11. 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  gastric nodule bx and notable for adenocarcinoma, poorly differentiated with signet ring morphology.  Focal invastion of muscularis mucosa.  Muscularis propia not present in bx.  Per path reports, this most likely represents primary source of metastatic carcinoma dx on bx of vagina on 11/8. - Oncology following, now s/p port placement 11/17 Appreciate oncology's assistance. Chemo 01/10/21.  Hemoglobin stable 8.7.   Suspected metastatic gastric cancer : Cycle 1 folfox started 12/27/20  Cycle 2 FOLFOX 01/10/2021.  Acute  blood loss anemia in the setting of chemotherapy Hemoglobin dropped this morning 6.8, 2 units PRBC ordered to be transfused on 01/08/2021.  Repeat CBC posttransfusion. Transfused 1 unit PRBC previously during this admission. Hemoglobin stable 8.7.  Fever: > Resolved. Patient has spiked fever could be related to malignancy. Hold abx for now, consider starting if recurrent or spike > 101 F 1 set of cultures from 11/21 with gram positive cocci with staph species (suspect contaminant) Repeat blood cultures NGTD Urine culture contaminated. Repeat CXR with subsegmental atelectasis.  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  - gastric nodule bx and notable for adenocarcinoma, poorly differentiated with signet ring morphology.  Focal invastion of muscularis mucosa.  Muscularis propia not present in bx.  Per path reports, this most likely represents primary source of metastatic carcinoma dx on bx of vagina on 11/8. - Onco recommended Outpatient PET study as outpatient   Chronic pain syndrome: Continue long-acting oxycontin 40 mg twice daily. Reduce short acting oxycodone to 10 mg every 6 as needed Palliative care consulted for pain management. LE [place] for LE pain negative.  Trial flexeril.  Increase gabapentin. Continue pain control as needed   Panic attacks: Continue benzo as needed  Gastritis: EGD with gastritis PPI x 6 weeks then 40 mg daily Colonoscopy with 5 polyps removed (1 in ascending and 4 in sigmoid) bx as noted above   Ovarian mass: Noted on MRI pelvis Workup as noted above.   Acute lower UTI-resolved as of 12/24/2020 S/p ceftriaxone Culture with strep viridans resolved    Malnutrition of moderate degree - Moderate malnutrition in context of acute illness/injury, morbid obesity - Evaluated by dietitian, appreciate assistance - per RD:  "Moderate Malnutrition related to acute illness as evidenced by percent weight loss, moderate muscle depletion, energy intake < 75% for > 7 days."    OSA (obstructive sleep apnea) Continue CPAP.   Diabetes mellitus ([place]) Continue SSI   Essential hypertension BP is at goal Continue home regimen Continue amlodipine, coreg  Physical debility/ambulatory dysfunction Per CIR cannot do chemoinfusion on CIR. SNF cannot administer chemotherapy/chemoinfusion. Continue PT OT with assistance and fall precautions     DVT prophylaxis: Lovenox subcu daily Code Status: Full code Family Communication: No family at bedside. Disposition Plan:   Status is: Inpatient  Remains inpatient appropriate because: Patient is medically clear.   Yes.  Awaiting CIR.  Patient need to continue chemotherapy which cannot be completed in CIR. TOC is working on finding SNF.  Patient is medically clear for discharge : Yes  Consultants:  Oncology IR Urology GI Palliative care D1 oncology  Procedures:  Diagnostic cystoscopy, bladder biopsy, urethral biopsy, 12/17/20 Cervical biopsies, 12/17/2020 EGD and colonoscopy 12/22/20 Port placement by IR 11/17   Antimicrobials:  Anti-infectives (From admission, onward)    Start     Dose/Rate Route Frequency Ordered Stop   12/23/20 1000  cefTRIAXone (ROCEPHIN) 1 g in sodium chloride 0.9 % 100 mL IVPB        1 g 200 mL/hr over 30 Minutes Intravenous Every 24 hours 12/23/20 0746 12/24/20 1030   12/20/20 1000  cefTRIAXone (ROCEPHIN) 1 g in sodium chloride 0.9 % 100 mL IVPB        1 g 200 mL/hr over 30 Minutes Intravenous Every 24  hours 12/20/20 0752 12/22/20 1451   12/17/20 0930  ceFAZolin (ANCEF) 3 g in dextrose 5 % 50 mL IVPB  Status:  Discontinued        3 g 100 mL/hr over 30 Minutes Intravenous  Once 12/17/20 0925 12/17/20 0926   12/17/20 0915  ceFAZolin (ANCEF) IVPB 3g/100 mL premix        3 g 200 mL/hr over 30 Minutes Intravenous  Once 12/17/20 0914  12/17/20 0935        Objective: Vitals:   01/10/21 2324 01/11/21 0524 01/11/21 0524 01/11/21 1333  BP:  (!) 149/76 (!) 149/76 117/73  Pulse: 91 89 89 84  Resp: 18 16 16 16   Temp:  98.3 F (36.8 C) 98.3 F (36.8 C) 97.7 F (36.5 C)  TempSrc:  Oral Oral Oral  SpO2: 95% 96% 96% 97%  Weight:      Height:        Intake/Output Summary (Last 24 hours) at 01/11/2021 1440 Last data filed at 01/11/2021 0549 Gross per 24 hour  Intake 294.34 ml  Output 1000 ml  Net -705.66 ml   Filed Weights   12/16/20 1307 12/17/20 0841 12/22/20 0928  Weight: 127 kg 127 kg 127 kg    Examination:   General exam: Well-developed well-nourished in no acute distress.  She is alert and oriented x3.   Respiratory system: Clear to auscultation with no wheezes or rales.  Good respiratory effort.   Cardiovascular system: Regular rate and rhythm no rubs or gallops.  No JVD or thyromegaly noted.   Gastrointestinal system: Obese nontender bowel sounds present.   Central nervous system: Alert and oriented.  Nonfocal exam..  Extremities: Trace lower extremity edema bilaterally.   Skin: No rashes or ulcerative lesions noted. Psychiatry: Mood is appropriate for condition and setting.   Data Reviewed: I have personally reviewed following labs and imaging studies  CBC: Recent Labs  Lab 01/05/21 0658 01/06/21 0625 01/08/21 0451 01/08/21 0830 01/09/21 0408 01/11/21 0630  WBC 6.4 6.5 6.4  --  6.1 5.7  NEUTROABS  --   --   --   --  3.6  --   HGB 7.4* 7.4* 6.8* 6.8* 8.7* 8.7*  HCT 23.3* 23.7* 22.1* 22.1* 27.0* 27.6*  MCV 95.1 96.7 96.5  --  93.1 93.9  PLT 152 150 186  --  185 696   Basic Metabolic Panel: Recent Labs  Lab 01/08/21 0451 01/10/21 0449 01/11/21 0549  NA 138 136 137  K 3.6 3.3* 4.0  CL 106 104 106  CO2 25 25 25   GLUCOSE 111* 113* 122*  BUN 16 14 12   CREATININE 0.77 0.81 0.74  CALCIUM 9.2 9.2 9.6  MG 2.1  --  2.0  PHOS 3.4  --   --    GFR: Estimated Creatinine Clearance: 106.1  mL/min (by C-G formula based on SCr of 0.74 mg/dL). Liver Function Tests: Recent Labs  Lab 01/10/21 0449  AST 17  ALT 15  ALKPHOS 476*  BILITOT 0.8  PROT 5.9*  ALBUMIN 2.3*    No results for input(s): LIPASE, AMYLASE in the last 168 hours. No results for input(s): AMMONIA in the last 168 hours. Coagulation Profile: No results for input(s): INR, PROTIME in the last 168 hours.  Cardiac Enzymes: No results for input(s): CKTOTAL, CKMB, CKMBINDEX, TROPONINI in the last 168 hours. BNP (last 3 results) No results for input(s): PROBNP in the last 8760 hours. HbA1C: No results for input(s): HGBA1C in the last 72 hours. CBG: Recent  Labs  Lab 01/10/21 1153 01/10/21 1701 01/10/21 2114 01/11/21 0727 01/11/21 1133  GLUCAP 167* 212* 166* 112* 131*   Lipid Profile: No results for input(s): CHOL, HDL, LDLCALC, TRIG, CHOLHDL, LDLDIRECT in the last 72 hours. Thyroid Function Tests: No results for input(s): TSH, T4TOTAL, FREET4, T3FREE, THYROIDAB in the last 72 hours. Anemia Panel: No results for input(s): VITAMINB12, FOLATE, FERRITIN, TIBC, IRON, RETICCTPCT in the last 72 hours. Sepsis Labs: No results for input(s): PROCALCITON, LATICACIDVEN in the last 168 hours.  No results found for this or any previous visit (from the past 240 hour(s)).   Radiology Studies: No results found.  Scheduled Meds:  amLODipine  5 mg Oral Daily   carvedilol  6.25 mg Oral BID WC   Chlorhexidine Gluconate Cloth  6 each Topical Daily   enoxaparin (LOVENOX) injection  60 mg Subcutaneous Q24H   feeding supplement  237 mL Oral TID BM   FLUOROURACIL (ADRUCIL) CHEMO infusion For Inpatient Use  2,000 mg/m2 (Treatment Plan Recorded) Intravenous Once   gabapentin  300 mg Oral QHS   insulin aspart  0-9 Units Subcutaneous TID WC   oxyCODONE  40 mg Oral Q12H   pantoprazole  40 mg Oral BID   polyethylene glycol  17 g Oral BID   senna-docusate  2 tablet Oral BID   sodium chloride flush  10-40 mL Intracatheter  Q12H   Continuous Infusions:     LOS: 26 days    Time spent: 25 mins    Kayleen Memos, MD [facility]   If 7PM-7AM, please contact night-coverage

## 2021-01-12 LAB — GLUCOSE, CAPILLARY
Glucose-Capillary: 113 mg/dL — ABNORMAL HIGH (ref 70–99)
Glucose-Capillary: 136 mg/dL — ABNORMAL HIGH (ref 70–99)
Glucose-Capillary: 107 mg/dL — ABNORMAL HIGH (ref 70–99)
Glucose-Capillary: 158 mg/dL — ABNORMAL HIGH (ref 70–99)

## 2021-01-12 NOTE — Progress Notes (Signed)
PROGRESS NOTE    Erin [name]  NFA:[id number] DOB: Dec 15, 1965 DOA: 12/16/2020 PCP: Pcp, No   Brief Narrative:  This 55 years old female with PMH significant for OSA on CPAP, HTN, DMII, recent diagnosis of ovarian anterolateral mass, morbid obesity who presented to [facility] ED with lower abdominal pain and lower extremity pain.  She has been followed by Dr. Berline Lopes with gynecology oncology for bilateral ovarian masses and endometrial mass.  Was prescribed oxycodone by gynecology oncology for her pain but it was not fully effective so she presented to the ED for further evaluation.  Work-up revealed rapid increase in the size of the left ovary with some adjacent ascites, masslike area of the cervix and potentially within the urethral diverticulum seen on CT abdomen and pelvis, not seen on prior MRI.  Left adrenal lesion measuring 3.2 x 2.6 cm, lucency in the spine concerning for metastatic disease.  MRI of the lumbar spine 12/16/2020 showed diffusely abnormal appearance of the marrow highly suspicious for diffuse osseous metastatic disease.  Retroperitoneal/iliac adenopathy with left larger than right adnexal masses.  Was taken to the OR on 12/17/2020 for EUA and cervical biopsies as well as diagnostic cystoscopy, bladder biopsy, endometrial biopsy.  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  Ongoing chemotherapy.  Plan to continue chemotherapy. Last chemotherapy session on 01/10/2021.  Hospital course complicated by drop in hemoglobin 6.8 K on 01/08/2021, 2 units PRBCs ordered to be transfused with patient's verbal consent.  01/12/2021: No new complaints this morning.  Ongoing chemotherapy.  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 MRI showed poorly marginated enhancing 3.7 x 2.8 x 3.0 cm mass centered at urethra concerning for malignancy -- solid avidly enhancing bilateral ovarian masses, largest 2.3 cm on Rt and 8.3 cm on Lt concerning for bilateral ovarian mets -- poorly marginated enhancing 2.4x2.3x3.0 cm mass in L uterine cervix, concerning for malignancy -- bilateral common iliac, bilateral external iliac, and bilateral inguinal LAD, concerning for metastatic disease -- diffuse patchy confluent nodular replacement of the pelvic osseus structures, concerning for osseus metastatic disease (see report) - Biopsy taken during cystoscopy on 12/17/2020 - biopsy has returned on 11/11. 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  gastric nodule bx and notable for adenocarcinoma, poorly differentiated with signet ring morphology.  Focal invastion of muscularis mucosa.  Muscularis propia not present in bx.  Per path reports, this most likely represents primary source of metastatic carcinoma dx on bx of vagina on 11/8. - Oncology following, now s/p port placement 11/17 Appreciate oncology's assistance. Chemo 01/10/21.  Hemoglobin stable 8.7.   Suspected metastatic gastric cancer : Cycle 1 folfox started 12/27/20  Cycle 2 FOLFOX 01/10/2021.  Acute blood loss anemia in the setting of chemotherapy Hemoglobin dropped this morning  6.8, 2 units PRBC ordered to be transfused on 01/08/2021.  Repeat CBC posttransfusion. Transfused 1 unit PRBC previously during this admission. Hemoglobin stable 8.7.  Fever: > Resolved. Patient has spiked fever could be related to malignancy. Hold abx for now, consider starting if recurrent or spike > 101 F 1 set of cultures from 11/21 with gram positive cocci with staph species (suspect contaminant) Repeat blood cultures NGTD Urine culture contaminated. Repeat CXR with subsegmental atelectasis.  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  - gastric nodule bx and notable for adenocarcinoma, poorly differentiated with signet ring morphology.  Focal invastion of muscularis mucosa.  Muscularis propia not present in bx.  Per path reports, this most likely represents primary source of metastatic carcinoma dx on bx of vagina on 11/8. - Onco recommended Outpatient PET study as outpatient   Chronic pain syndrome: Continue long-acting oxycontin 40 mg twice daily. Reduce short acting oxycodone to 10 mg every 6 as needed Palliative care consulted for pain management. LE [place] for LE pain negative.  Trial flexeril.  Increase gabapentin. Continue pain control as needed   Panic attacks: Continue benzo as needed  Gastritis: EGD with gastritis PPI x 6 weeks then 40 mg daily Colonoscopy with 5 polyps removed (1 in ascending and 4 in sigmoid) bx as noted above   Ovarian mass: Noted on MRI pelvis Workup as noted above.   Acute lower UTI-resolved as of 12/24/2020 S/p ceftriaxone Culture with strep viridans resolved    Malnutrition of moderate degree - Moderate malnutrition in context of acute illness/injury, morbid obesity - Evaluated by dietitian, appreciate assistance - per RD: "Moderate Malnutrition related to acute illness as evidenced by percent weight loss,  moderate muscle depletion, energy intake < 75% for > 7 days."    OSA (obstructive sleep apnea) Continue CPAP.   Diabetes mellitus ([place]) Continue SSI   Essential hypertension BP is at goal Continue home regimen Continue amlodipine, coreg  Physical debility/ambulatory dysfunction Per CIR cannot do chemoinfusion on CIR. SNF cannot administer chemotherapy/chemoinfusion. Continue PT OT with assistance and fall precautions     DVT prophylaxis: Lovenox subcu daily Code Status: Full code Family Communication: No family at bedside. Disposition Plan:   Status is: Inpatient  Remains inpatient appropriate because: Patient is medically clear.   Yes.  Awaiting CIR.  Patient need to continue chemotherapy which cannot be completed in CIR. TOC is working on finding SNF.  Patient is medically clear for discharge : Yes  Consultants:  Oncology IR Urology GI Palliative care D1 oncology  Procedures:  Diagnostic cystoscopy, bladder biopsy, urethral biopsy, 12/17/20 Cervical biopsies, 12/17/2020 EGD and colonoscopy 12/22/20 Port placement by IR 11/17   Antimicrobials:  Anti-infectives (From admission, onward)    Start     Dose/Rate Route Frequency Ordered Stop   12/23/20 1000  cefTRIAXone (ROCEPHIN) 1 g in sodium chloride 0.9 % 100 mL IVPB        1 g 200 mL/hr over 30 Minutes Intravenous Every 24 hours 12/23/20 0746 12/24/20 1030   12/20/20 1000  cefTRIAXone (ROCEPHIN) 1 g in sodium chloride 0.9 % 100 mL IVPB        1 g 200 mL/hr over 30 Minutes Intravenous Every 24 hours 12/20/20 0752 12/22/20 1451   12/17/20 0930  ceFAZolin (ANCEF) 3 g in dextrose  5 % 50 mL IVPB  Status:  Discontinued        3 g 100 mL/hr over 30 Minutes Intravenous  Once 12/17/20 0925 12/17/20 0926   12/17/20 0915  ceFAZolin (ANCEF) IVPB 3g/100 mL premix        3 g 200 mL/hr over 30 Minutes Intravenous  Once 12/17/20 0914 12/17/20 0935        Objective: Vitals:   01/11/21 1333 01/11/21 2102 01/12/21  0641 01/12/21 1356  BP: 117/73 (!) 142/90 133/79 (!) 146/98  Pulse: 84 89 88 92  Resp: 16 16 17 17   Temp: 97.7 F (36.5 C) 97.7 F (36.5 C)  98.2 F (36.8 C)  TempSrc: Oral Oral  Oral  SpO2: 97% 95% 97% 96%  Weight:      Height:        Intake/Output Summary (Last 24 hours) at 01/12/2021 1455 Last data filed at 01/12/2021 0700 Gross per 24 hour  Intake 587.68 ml  Output 500 ml  Net 87.68 ml   Filed Weights   12/16/20 1307 12/17/20 0841 12/22/20 0928  Weight: 127 kg 127 kg 127 kg    Examination: No significant change from prior exam  General exam: Well-developed well-nourished in no acute distress.  She is alert and oriented x3.   Respiratory system: Clear to auscultation with no wheezes or rales.  Good respiratory effort.   Cardiovascular system: Regular rate and rhythm no rubs or gallops.  No JVD or thyromegaly noted.   Gastrointestinal system: Obese nontender bowel sounds present.   Central nervous system: Alert and oriented.  Nonfocal exam..  Extremities: Trace lower extremity edema bilaterally.   Skin: No rashes or ulcerative lesions noted. Psychiatry: Mood is appropriate for condition and setting.   Data Reviewed: I have personally reviewed following labs and imaging studies  CBC: Recent Labs  Lab 01/06/21 0625 01/08/21 0451 01/08/21 0830 01/09/21 0408 01/11/21 0630  WBC 6.5 6.4  --  6.1 5.7  NEUTROABS  --   --   --  3.6  --   HGB 7.4* 6.8* 6.8* 8.7* 8.7*  HCT 23.7* 22.1* 22.1* 27.0* 27.6*  MCV 96.7 96.5  --  93.1 93.9  PLT 150 186  --  185 947   Basic Metabolic Panel: Recent Labs  Lab 01/08/21 0451 01/10/21 0449 01/11/21 0549  NA 138 136 137  K 3.6 3.3* 4.0  CL 106 104 106  CO2 25 25 25   GLUCOSE 111* 113* 122*  BUN 16 14 12   CREATININE 0.77 0.81 0.74  CALCIUM 9.2 9.2 9.6  MG 2.1  --  2.0  PHOS 3.4  --   --    GFR: Estimated Creatinine Clearance: 106.1 mL/min (by C-G formula based on SCr of 0.74 mg/dL). Liver Function Tests: Recent Labs   Lab 01/10/21 0449  AST 17  ALT 15  ALKPHOS 476*  BILITOT 0.8  PROT 5.9*  ALBUMIN 2.3*    No results for input(s): LIPASE, AMYLASE in the last 168 hours. No results for input(s): AMMONIA in the last 168 hours. Coagulation Profile: No results for input(s): INR, PROTIME in the last 168 hours.  Cardiac Enzymes: No results for input(s): CKTOTAL, CKMB, CKMBINDEX, TROPONINI in the last 168 hours. BNP (last 3 results) No results for input(s): PROBNP in the last 8760 hours. HbA1C: No results for input(s): HGBA1C in the last 72 hours. CBG: Recent Labs  Lab 01/11/21 1133 01/11/21 1702 01/11/21 2102 01/12/21 0806 01/12/21 1144  GLUCAP 131* 144* 127* 113* 136*  Lipid Profile: No results for input(s): CHOL, HDL, LDLCALC, TRIG, CHOLHDL, LDLDIRECT in the last 72 hours. Thyroid Function Tests: No results for input(s): TSH, T4TOTAL, FREET4, T3FREE, THYROIDAB in the last 72 hours. Anemia Panel: No results for input(s): VITAMINB12, FOLATE, FERRITIN, TIBC, IRON, RETICCTPCT in the last 72 hours. Sepsis Labs: No results for input(s): PROCALCITON, LATICACIDVEN in the last 168 hours.  No results found for this or any previous visit (from the past 240 hour(s)).   Radiology Studies: No results found.  Scheduled Meds:  amLODipine  5 mg Oral Daily   carvedilol  6.25 mg Oral BID WC   Chlorhexidine Gluconate Cloth  6 each Topical Daily   enoxaparin (LOVENOX) injection  60 mg Subcutaneous Q24H   feeding supplement  237 mL Oral TID BM   FLUOROURACIL (ADRUCIL) CHEMO infusion For Inpatient Use  2,000 mg/m2 (Treatment Plan Recorded) Intravenous Once   gabapentin  300 mg Oral QHS   insulin aspart  0-9 Units Subcutaneous TID WC   oxyCODONE  40 mg Oral Q12H   pantoprazole  40 mg Oral BID   polyethylene glycol  17 g Oral BID   senna-docusate  2 tablet Oral BID   sodium chloride flush  10-40 mL Intracatheter Q12H   Continuous Infusions:     LOS: 27 days    Time spent: 25  mins    Kayleen Memos, MD [facility]   If 7PM-7AM, please contact night-coverage

## 2021-01-13 LAB — GLUCOSE, CAPILLARY
Glucose-Capillary: 110 mg/dL — ABNORMAL HIGH (ref 70–99)
Glucose-Capillary: 139 mg/dL — ABNORMAL HIGH (ref 70–99)
Glucose-Capillary: 98 mg/dL (ref 70–99)
Glucose-Capillary: 110 mg/dL — ABNORMAL HIGH (ref 70–99)

## 2021-01-13 NOTE — Progress Notes (Signed)
Palliative Care Brief Progress Note  Palliative care has been following peripherally, but goals are clear for Erin [name] with desire for continued systemic therapy.  Symptoms are also well managed.  TOC is exploring disposition options where she can continue to receive chemotherapy.  Palliative care to sign off and will no longer follow Erin [name] this admission, however, we are always happy to reengage for any palliative specific needs.  Please call or reconsult if we can be of further assistance in the care of Erin [name] moving forward.  Micheline Rough, MD [facility] Team [phone]  NO CHARGE NOTE

## 2021-01-13 NOTE — Progress Notes (Signed)
Physical Therapy Treatment Patient Details Name: SHADY PADRON MRN: [id number] DOB: 1965-09-01 Today's Date: 01/13/2021   History of Present Illness This 55 years old female with PMH significant for OSA on CPAP, HTN, DMII, morbid obesity who presented with worsening abdominal pain and left thigh pain.   She has been undergoing workup for recently discovered cervical/urethral masses on CT in Sept 2022.  Further imaging showed ongoing interval enlargement of the left adnexal area.  There was also associated retroperitoneal and pelvic adenopathy.  MRI pelvis also performed on 12/05/2020 showed poorly enhancing mass centered at the urethra, solid enhancing bilateral ovarian masses, poorly enhancing mass involving left uterine cervix, and significant adenopathy involving bilateral common iliac, bilateral external iliac, bilateral inguinal lymph nodes.  Also noted to have pelvic osseous lesions concerning for metastatic disease as well.  Patient underwent cystoscopy with ureteral and bladder biopsy on 12/17/2020 she also underwent cervical biopsies following this procedure.  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    PT Comments    Pt ambulated in hallway and overall min/guard with mobility at this time.  Pt does report fatigue and mostly pain limiting mobility.  Pt anticipating d/c plan for home at this time.  Pt may benefit from increased home care such as home health aide as well as HHPT.    Recommendations for follow up therapy are one component of a multi-disciplinary discharge planning process, led by the attending physician.  Recommendations may be updated based on patient status, additional functional criteria and insurance authorization.  Follow Up Recommendations  Home health PT     Assistance Recommended at Discharge Intermittent Supervision/Assistance  Equipment Recommendations  Rolling walker (2 wheels);BSC/3in1     Recommendations for Other Services       Precautions / Restrictions Precautions Precautions: Fall Precaution Comments: METS pelvis and spine     Mobility  Bed Mobility Overal bed mobility: Needs Assistance Bed Mobility: Supine to Sit     Supine to sit: Min guard;HOB elevated     General bed mobility comments: cues for self assist    Transfers Overall transfer level: Needs assistance Equipment used: Rolling walker (2 wheels) Transfers: Sit to/from Stand Sit to Stand: Min guard           General transfer comment: min/guard for safety, wide BOS    Ambulation/Gait Ambulation/Gait assistance: Min guard Gait Distance (Feet): 60 Feet (x2) Assistive device: Rolling walker (2 wheels) Gait Pattern/deviations: Decreased stride length;Shuffle;Trunk flexed;Step-through pattern Gait velocity: decreased     General Gait Details: verbal cues for posture and RW positioning, distance to tolerance, required one standing rest break   Stairs             Wheelchair Mobility    Modified Rankin (Stroke Patients Only)       Balance                                            Cognition Arousal/Alertness: Awake/alert Behavior During Therapy: WFL for tasks assessed/performed Overall Cognitive Status: Within Functional Limits for tasks assessed                                          Exercises General Exercises - Lower Extremity Ankle Circles/Pumps: AROM;Both;10 reps;Seated Long Arc  Quad: AROM;Both;10 reps;Seated Hip ABduction/ADduction: AROM;Both;10 reps;Seated Hip Flexion/Marching: AROM;Both;Seated;10 reps    General Comments        Pertinent Vitals/Pain Pain Assessment: Faces Faces Pain Scale: Hurts even more Pain Location: pelvis area/lower abdomen, lower back Pain Descriptors / Indicators: Discomfort;Aching Pain Intervention(s): Repositioned;Monitored during session;Premedicated before session    Home Living                           Prior Function            PT Goals (current goals can now be found in the care plan section) Acute Rehab PT Goals PT Goal Formulation: With patient Time For Goal Achievement: 01/27/21 Potential to Achieve Goals: Good Progress towards PT goals: Progressing toward goals    Frequency    Min 3X/week      PT Plan Current plan remains appropriate    Co-evaluation              AM-PAC PT "6 Clicks" Mobility   Outcome Measure  Help needed turning from your back to your side while in a flat bed without using bedrails?: A Little Help needed moving from lying on your back to sitting on the side of a flat bed without using bedrails?: A Little Help needed moving to and from a bed to a chair (including a wheelchair)?: A Little Help needed standing up from a chair using your arms (e.g., wheelchair or bedside chair)?: A Little Help needed to walk in hospital room?: A Little Help needed climbing 3-5 steps with a railing? : A Lot 6 Click Score: 17    End of Session Equipment Utilized During Treatment: Gait belt Activity Tolerance: Patient tolerated treatment well Patient left: in chair;with call bell/phone within reach Nurse Communication: Mobility status (NT to change bed linen (saturated with urine)) PT Visit Diagnosis: Difficulty in walking, not elsewhere classified (R26.2);Muscle weakness (generalized) (M62.81)     Time: 7793-9030 PT Time Calculation (min) (ACUTE ONLY): 19 min  Charges:  $Gait Training: 8-22 mins                    Jannette Spanner PT, DPT Acute Rehabilitation Services Pager: [phone] Office: [place] 01/13/2021, 3:07 PM

## 2021-01-13 NOTE — Progress Notes (Addendum)
HEMATOLOGY-ONCOLOGY PROGRESS NOTE  SUBJECTIVE: Tolerated cycle 2 of her chemotherapy well overall.  She denies nausea and vomiting.  Denies diarrhea.  Denies peripheral neuropathy.  Pain well controlled at this time.  PHYSICAL EXAMINATION:  Vitals:   01/12/21 1951 01/13/21 0458  BP: (!) 154/85 140/87  Pulse: 100 95  Resp: 16 18  Temp: 98.2 F (36.8 C) 98.3 F (36.8 C)  SpO2: 94% 98%   Filed Weights   12/16/20 1307 12/17/20 0841 12/22/20 0928  Weight: 127 kg 127 kg 127 kg    Intake/Output from previous day: 12/04 0701 - 12/05 0700 In: -  Out: 800 [Urine:800] HEENT: No thrush ABDOMEN: Soft, tender with mild firmness in the left mid and lower abdomen NEURO: alert & oriented x 3 with fluent speech, no focal motor/sensory deficits Vascular: No leg edema Skin: Palms without erythema Lungs: Clear bilaterally Cardiac: Regular rate and rhythm  Port-A-Cath without erythema  LABORATORY DATA:  I have reviewed the data as listed CMP Latest Ref Rng & Units 01/11/2021 01/10/2021 01/08/2021  Glucose 70 - 99 mg/dL 122(H) 113(H) 111(H)  BUN 6 - 20 mg/dL _0 Creatinine 0.44 - 1.00 mg/dL 0.74 0.81 0.77  Sodium 135 - 145 mmol/L 137 136 138  Potassium 3.5 - 5.1 mmol/L 4.0 3.3(L) 3.6  Chloride 98 - 111 mmol/L 106 104 106  CO2 22 - 32 mmol/L _1 Calcium 8.9 - 10.3 mg/dL 9.6 9.2 9.2  Total Protein 6.5 - 8.1 g/dL - 5.9(L) -  Total Bilirubin 0.3 - 1.2 mg/dL - 0.8 -  Alkaline Phos 38 - 126 U/L - 476(H) -  AST 15 - 41 U/L - 17 -  ALT 0 - 44 U/L - 15 -    Lab Results  Component Value Date   WBC 5.7 01/11/2021   HGB 8.7 (L) 01/11/2021   HCT 27.6 (L) 01/11/2021   MCV 93.9 01/11/2021   PLT 244 01/11/2021   NEUTROABS 3.6 01/09/2021    DG Chest 2 View  Result Date: 12/16/2020 CLINICAL DATA:  Shortness of breath in a 55 year old female. EXAM: CHEST - 2 VIEW COMPARISON:  July 10, 2009. FINDINGS: Linear opacities in the LEFT chest of progressed slightly since the previous study.  No lobar consolidation. Cardiomediastinal contours and hilar structures are stable. No sign of effusion. Mildly increased density in the subcarinal region on lateral projection. No acute skeletal process on limited assessment. IMPRESSION: Signs of scarring or atelectasis in the LEFT chest, slightly progressed since the previous study. Question developing subcarinal adenopathy, based on lateral projection. Developing lower lobe airspace disease is also a differential consideration. In a patient with pelvic masses on recent pelvic MRI would consider follow-up chest CT for further evaluation. Electronically Signed   By: Zetta Bills M.D.   On: 12/16/2020 14:13   CT CHEST W CONTRAST  Result Date: 12/17/2020 CLINICAL DATA:  Cancer of unknown primary.  Staging. EXAM: CT CHEST WITH CONTRAST TECHNIQUE: Multidetector CT imaging of the chest was performed during intravenous contrast administration. CONTRAST:  67m OMNIPAQUE IOHEXOL 350 MG/ML SOLN COMPARISON:  Chest CT dated 06/01/2007. FINDINGS: Cardiovascular: There is no cardiomegaly or pericardial effusion. Three-vessel coronary vascular calcification. Mild atherosclerotic calcification of the thoracic aorta. No aneurysmal dilatation. The origins of the great vessels of the aortic arch appear patent as visualized. There is dilatation of the main pulmonary trunk suggestive of pulmonary hypertension. Evaluation of the pulmonary arteries is limited due to respiratory motion artifact and suboptimal opacification and timing of  the contrast. No large or central pulmonary artery embolus identified. Mediastinum/Nodes: No hilar or mediastinal adenopathy. The esophagus is grossly unremarkable. Prior right hemithyroidectomy. No mediastinal fluid collection. Lungs/Pleura: Bibasilar subpleural atelectasis/scarring. There is no pleural effusion pneumothorax. The central airways are patent. Upper Abdomen: Indeterminate 3 cm left renal nodule, present on the prior CT, likely a  benign etiology such as adenoma. Musculoskeletal: Degenerative changes of the spine. No acute osseous pathology. IMPRESSION: 1. No acute intrathoracic pathology. No CT evidence of central pulmonary artery embolus. 2. Dilatation of the main pulmonary trunk suggestive of pulmonary hypertension. 3. Status post prior right hemithyroidectomy. 4. Bilateral linear atelectasis/scarring. 5. Aortic Atherosclerosis (ICD10-I70.0). Electronically Signed   By: Anner Crete M.D.   On: 12/17/2020 21:09   MR Lumbar Spine W Wo Contrast  Result Date: 12/16/2020 CLINICAL DATA:  Initial evaluation for low back pain, cancer suspected. EXAM: MRI LUMBAR SPINE WITHOUT AND WITH CONTRAST TECHNIQUE: Multiplanar and multiecho pulse sequences of the lumbar spine were obtained without and with intravenous contrast. CONTRAST:  32m GADAVIST GADOBUTROL 1 MMOL/ML IV SOLN COMPARISON:  Prior CT from earlier the same day. FINDINGS: Segmentation: Standard. Lowest well-formed disc space labeled the L5-S1 level. Alignment: 4 mm facet mediated anterolisthesis of L4 on L5. Alignment otherwise normal with preservation of the normal lumbar lordosis. Vertebrae: Diffusely abnormal appearance of the bone marrow is seen throughout the visualized lumbar spine and pelvis. Associated heterogeneous STIR hyperintensity with irregular heterogeneous postcontrast enhancement. Findings are highly suspicious for diffuse osseous metastatic disease. Involvement appears to be most pronounced within the T12 vertebral body as well as the visualized pelvis. Exact measurements of a discrete lesion is difficult given the overall infiltrative appearance of this finding. No associated pathologic fracture. No visible extra osseous extension of tumor at this time. Conus medullaris and cauda equina: Conus extends to the L1 level. Conus and cauda equina appear normal. No visible epidural or intracanalicular tumor. Paraspinal and other soft tissues: Mild edema within the  subcutaneous fat of the lower back, which could be related to overall volume status. Paraspinous soft tissues demonstrate no other acute finding. Retroperitoneal/iliac adenopathy noted. Left larger than right adnexal masses partially visualized. Findings better characterized on prior CT. Disc levels: L1-2:  Unremarkable. L2-3: Disc desiccation with mild disc bulge. Superimposed small right foraminal to extraforaminal disc protrusion (series 11, image 17). Mild facet hypertrophy. Underlying short pedicles with a degree of mild spinal stenosis. Mild bilateral L2 foraminal narrowing. L3-4: Disc desiccation with mild disc bulge. Superimposed shallow left extraforaminal disc protrusion with annular fissure (series 11, image 24). Mild facet hypertrophy. Underlying short pedicles. Mild spinal stenosis. Foramina remain patent. L4-5: 4 mm anterolisthesis. Disc desiccation with broad-based posterior pseudo disc bulge. Biforaminal annular fissures noted, left larger than right. Moderate bilateral facet arthrosis. Resultant moderate spinal stenosis. Mild to moderate bilateral L4 foraminal narrowing. L5-S1: Normal interspace. Mild right greater than left facet hypertrophy. No stenosis. IMPRESSION: 1. 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 No associated pathologic fracture or extra osseous extension of tumor at this time. 2. Retroperitoneal/iliac adenopathy with left larger than right adnexal masses, better characterized on prior CT. 3. Underlying multilevel degenerative spondylosis as above, most pronounced at L4-5 where there is resultant moderate spinal stenosis. Electronically Signed   By: BJeannine BogaM.D.   On: 12/16/2020 23:44   [place]vis Complete  Result Date: 12/16/2020 CLINICAL DATA:  Pelvic pain EXAM: TRANSABDOMINAL ULTRASOUND OF  PELVIS DOPPLER ULTRASOUND OF OVARIES TECHNIQUE:  Transabdominal ultrasound examination of the pelvis was performed including evaluation of the uterus, ovaries, adnexal regions, and pelvic cul-de-sac. Color and duplex Doppler ultrasound was utilized to evaluate blood flow to the ovaries. COMPARISON:  CT 12/16/2020, MRI 12/05/2020, pelvic ultrasound 12/01/2020 FINDINGS: Uterus Measurements: 6.8 x 3.4 x 5 cm = volume: 60.5 mL. No fibroids or other mass visualized. Endometrium Poorly visible, unable to measure. Right ovary Measurements: 4.8 x 2.9 x 5.5 cm = volume: 38.6 mL. Solid right adnexal mass replaces most of right ovary. Left ovary Measurements: 10.5 x 8.7 x 9.2 cm = volume: 440 mL. Enlarged left ovary with heterogenous solid mass that replaces the left ovary. Previously the left ovary measured 8.3 x 7.7 x 8.5 cm on MRI. Pulsed Doppler evaluation demonstrates normal arterial and venous waveforms in both or adnexa, though note that both ovaries are essentially replaced by mass lesions. Other: No significant ascites. Foley catheter within the bladder which appears somewhat thick walled. IMPRESSION: 1. Very limited exam secondary to habitus and only trans abdominal technique 2. Bilateral solid adnexal masses essentially replacing the ovaries which thereby limits assessment of the ovaries. Left adnexal solid mass lesion has significantly increased in size; documented flow in the adnexa relates to flow within the solid adnexal masses. Gynecology follow-up recommended. Electronically Signed   By: Donavan Foil M.D.   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 EXAM: CT ABDOMEN AND PELVIS WITH CONTRAST TECHNIQUE: Multidetector CT imaging of the abdomen and pelvis was performed using the standard protocol following bolus administration of intravenous contrast. CONTRAST:  22m OMNIPAQUE IOHEXOL 350 MG/ML SOLN  COMPARISON:  Comparison is made with multiple prior studies which were performed recently, most recent comparison is an MRI of the pelvis of December 05, 2020. FINDINGS: Lower chest: Basilar atelectasis. Subcarinal region not well evaluated. No consolidation or effusion at the lung bases. Hepatobiliary: No focal, suspicious hepatic lesion. No pericholecystic stranding. Portal vein is patent. No biliary duct dilation. Pancreas: Pancreas with normal contours. No signs of adjacent inflammation. Spleen: Spleen normal size and contour. Adrenals/Urinary Tract: Well-circumscribed LEFT adrenal lesion, density value of 19 Hounsfield units on previous noncontrast imaging measuring 3.2 x 2.6 cm., relative washout is indeterminate at 30%. Normal RIGHT adrenal. Normal, symmetric enhancement of bilateral kidneys without focal renal lesion. Stomach/Bowel: Small hiatal hernia. No stranding adjacent to the stomach. No sign of small bowel obstruction or acute small bowel process. The appendix is normal. Stool in various parts of the colon without signs of obstruction or adjacent stranding. Vascular/Lymphatic: Retroperitoneal adenopathy (image 50/2) 14 mm lymph node previously 11 mm along the LEFT common iliac chain. LEFT pelvic sidewall lymph node (image 75/2) 14 mm short axis, previously 13 mm. Similar size of RIGHT pelvic sidewall/external iliac lymph nodes also with little change compared to the most recent comparison imaging. Reproductive: Foley catheter in situ. Uterus and cervix not well at assess nor are the masses within the area of the cervix and urethra seen on previous MRI. Increasing size of LEFT ovary with heterogeneous appearance now measuring 10 x 8.8 cm. When assessed on the study of October 20, 2020 the ovary for reference measured 3.7 x 2.5 cm. RIGHT ovary with area of enhancement shows enlargement as well but not as pronounced as on previous imaging with 2.8 cm area of enhancement in  the RIGHT ovary as the largest  discrete area which is similar to the recent MRI evaluation. As compared to the study of October 20, 2020 there is little change in the appearance of the RIGHT ovary aside from this area though there is an increasingly nodular appearance of the ovary in this location. Mass posterior to the LEFT ovary just above the uterus measuring 4.4 x 2.8 cm has enlarged from 2.5 cm. Other: Small volume ascites. Musculoskeletal: Heterogeneous pattern associated with the spine in terms of density raises the question of metastatic disease without discrete measurable focal lesion in the spine. For example a geographic area of sclerosis on image 101/5) in the anterior L3 vertebral body does raise the question of underlying lesion with numerous additional foci, essentially nearly all levels of the spine with some degree of heterogeneity. Another example of this finding is noted on the RIGHT at the T10 level. No acute bone finding or frankly destructive bone process. IMPRESSION: Rapid increase in size of the LEFT ovary with some adjacent ascites. The rapid enlargement is seen in the setting of generalized worsening of masses associated with the LEFT and RIGHT ovary but perhaps more pronounced than other areas in the pelvis. The possibility of torsion associated with an ovarian mass could be considered based on this finding. Would correlate with any worsening abdominal pain and with sonogram as warranted for further assessment. Enlarging masses elsewhere though not to the extent that is seen with the LEFT ovary. Masslike area of the cervix and potentially within a urethral diverticulum not as well seen as on the recent MRI evaluation. Small volume ascites. Well-circumscribed LEFT adrenal lesion measuring 3.2 x 2.6 cm, density value of 19 Hounsfield units on previous noncontrast imaging. Relative washout is indeterminate at 30%. This may represent an adrenal adenoma. Consider dedicated adrenal protocol CT or attention on PET evaluation  if performed. Heterogeneous pattern of subtle sclerosis and lucency in the spine on today's study raising the question of bony metastatic disease in the spine. Findings of increased ovarian size on the LEFT out of proportion other areas were called by telephone at the time of interpretation on 12/16/2020 at 5:44 pm to provider Theodis Blaze , who verbally acknowledged these results. Electronically Signed   By: Zetta Bills M.D.   On: 12/16/2020 17:46   [place] Art/Ven Flow Abd Pelv Doppler  Result Date: 12/16/2020 CLINICAL DATA:  Pelvic pain EXAM: TRANSABDOMINAL ULTRASOUND OF PELVIS DOPPLER ULTRASOUND OF OVARIES TECHNIQUE: Transabdominal ultrasound examination of the pelvis was performed including evaluation of the uterus, ovaries, adnexal regions, and pelvic cul-de-sac. Color and duplex Doppler ultrasound was utilized to evaluate blood flow to the ovaries. COMPARISON:  CT 12/16/2020, MRI 12/05/2020, pelvic ultrasound 12/01/2020 FINDINGS: Uterus Measurements: 6.8 x 3.4 x 5 cm = volume: 60.5 mL. No fibroids or other mass visualized. Endometrium Poorly visible, unable to measure. Right ovary Measurements: 4.8 x 2.9 x 5.5 cm = volume: 38.6 mL. Solid right adnexal mass replaces most of right ovary. Left ovary Measurements: 10.5 x 8.7 x 9.2 cm = volume: 440 mL. Enlarged left ovary with heterogenous solid mass that replaces the left ovary. Previously the left ovary measured 8.3 x 7.7 x 8.5 cm on MRI. Pulsed Doppler evaluation demonstrates normal arterial and venous waveforms in both or adnexa, though note that both ovaries are essentially replaced by mass lesions. Other: No significant ascites. Foley catheter within the bladder which appears somewhat thick walled. IMPRESSION: 1. Very limited exam secondary to habitus and only trans abdominal  technique 2. Bilateral solid adnexal masses essentially replacing the ovaries which thereby limits assessment of the ovaries. Left adnexal solid mass lesion has significantly increased  in size; documented flow in the adnexa relates to flow within the solid adnexal masses. Gynecology follow-up recommended. Electronically Signed   By: Donavan Foil M.D.   On: 12/16/2020 19:23   DG CHEST PORT 1 VIEW  Result Date: 12/31/2020 CLINICAL DATA:  Chest pain EXAM: PORTABLE CHEST 1 VIEW COMPARISON:  Previous studies including the examination of 12/30/2020 FINDINGS: Transverse diameter of heart is increased. There are no signs of pulmonary edema or focal pulmonary consolidation. There are small linear densities in both mid lung fields and left lower lung fields suggesting subsegmental atelectasis with interval improvement in the left lung and possible slight worsening in the right mid lung fields. IMPRESSION: Cardiomegaly. There are no signs of pulmonary edema or focal pulmonary consolidation. There are small linear densities in the both mid and left lower lung fields suggesting subsegmental atelectasis with some improvement in the left lung. Electronically Signed   By: Elmer Picker M.D.   On: 12/31/2020 13:39   DG CHEST PORT 1 VIEW  Result Date: 12/30/2020 CLINICAL DATA:  Fever, abdominal pain EXAM: PORTABLE CHEST 1 VIEW COMPARISON:  Previous studies including the CT chest done on 12/17/2020 FINDINGS: Transverse diameter of heart is increased. There are linear densities in both parahilar regions and medial left lower lung fields suggesting scarring or subsegmental atelectasis with interval worsening in the left lower lung fields. There is no significant pleural effusion or pneumothorax. Surgical clips are seen in the right thyroid bed. IMPRESSION: Cardiomegaly. There are linear densities in both parahilar regions and left lower lung fields with interval worsening suggesting subsegmental atelectasis. There are no signs of alveolar pulmonary edema or focal pulmonary consolidation. Electronically Signed   By: Elmer Picker M.D.   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 EXAM: IMPLANTED PORT A CATH PLACEMENT WITH ULTRASOUND AND FLUOROSCOPIC GUIDANCE COMPARISON:  None. MEDICATIONS: None. ANESTHESIA/SEDATION: Moderate (conscious) sedation was employed during this procedure. A total of Versed 3 mg and Fentanyl 100 mcg was administered intravenously. Moderate Sedation Time: 19 minutes. The patient's level of consciousness and vital signs were monitored continuously by radiology nursing throughout the procedure under my direct supervision. CONTRAST:  None FLUOROSCOPY TIME:  0 minutes, 12 seconds (5 mGy) COMPLICATIONS: None immediate. PROCEDURE: The procedure, risks, benefits, and alternatives were explained to the patient. Questions regarding the procedure were encouraged and answered. The patient understands and consents to the procedure. The right neck and chest were prepped with chlorhexidine in a sterile fashion, and a sterile drape was applied covering the operative field. Maximum barrier sterile technique with sterile gowns and gloves were used for the procedure. A timeout was performed prior to the initiation of the procedure. Ultrasound was used to examine the jugular vein which was compressible and free of internal echoes. A skin marker was used to demarcate the planned venotomy and port pocket incision sites. Local anesthesia was provided to these sites and the subcutaneous tunnel track with 1% lidocaine with 1:100,000 epinephrine. A small incision was created at the jugular access site and blunt dissection was performed of the subcutaneous tissues. Under ultrasound guidance, the jugular vein was accessed with a 21 ga micropuncture needle and an 0.018" wire was inserted to the superior  vena cava. Real-time ultrasound guidance was utilized for vascular access including the acquisition of a  permanent ultrasound image documenting patency of the accessed vessel. A 5 Fr micopuncture set was then used, through which a 0.035" Rosen wire was passed under fluoroscopic guidance into the inferior vena cava. An 8 Fr dilator was then placed over the wire. A subcutaneous port pocket was then created along the upper chest wall utilizing a combination of sharp and blunt dissection. The pocket was irrigated with sterile saline, packed with gauze, and observed for hemorrhage. A single lumen "ISP" sized power injectable port was chosen for placement. The 8 Fr catheter was tunneled from the port pocket site to the venotomy incision. The port was placed in the pocket. The external catheter was trimmed to appropriate length. The dilator was exchanged for an 8 Fr peel-away sheath under fluoroscopic guidance. The catheter was then placed through the sheath and the sheath was removed. Final catheter positioning was confirmed and documented with a fluoroscopic spot radiograph. The port was accessed with a Huber needle, aspirated, and flushed with heparinized saline. The deep dermal layer of the port pocket incision was closed with interrupted 3-0 Vicryl suture. The skin was opposed with a running subcuticular 4-0 Monocryl suture. Dermabond was then placed over the port pocket and neck incisions. The patient tolerated the procedure well without immediate post procedural complication. FINDINGS: After catheter placement, the tip lies within the superior cavoatrial junction. The catheter aspirates and flushes normally and is ready for immediate use. IMPRESSION: Successful placement of a power injectable Port-A-Cath via the right internal jugular vein. The catheter is ready for immediate use. Ruthann Cancer, MD [facility] Radiology Electronically Signed   By: Ruthann Cancer M.D.   On: 12/26/2020 11:08   VAS [place] LOWER EXTREMITY VENOUS (DVT)  Result Date: 12/29/2020  Lower Venous  DVT Study Patient Name:  YARISSA REINING Larcom  Date of Exam:   12/29/2020 Medical Rec #: [id number]            Accession #:    [id number] Date of Birth: 1965/09/19            Patient Gender: F Patient Age:   39 years Exam Location:  [facility] Procedure:      VAS [place] LOWER EXTREMITY VENOUS (DVT) Referring Phys: A POWELL JR --------------------------------------------------------------------------------  Indications: Pain.  Risk Factors: Cancer. Limitations: Body habitus and poor ultrasound/tissue interface. Comparison Study: No prior studies. Performing Technologist: Oliver Hum RVT  Examination Guidelines: A complete evaluation includes B-mode imaging, spectral Doppler, color Doppler, and power Doppler as needed of all accessible portions of each vessel. Bilateral testing is considered an integral part of a complete examination. Limited examinations for reoccurring indications may be performed as noted. The reflux portion of the exam is performed with the patient in reverse Trendelenburg.  +---------+---------------+---------+-----------+----------+--------------+ RIGHT    CompressibilityPhasicitySpontaneityPropertiesThrombus Aging +---------+---------------+---------+-----------+----------+--------------+ CFV      Full           Yes      Yes                                 +---------+---------------+---------+-----------+----------+--------------+ SFJ      Full                                                        +---------+---------------+---------+-----------+----------+--------------+  FV Prox  Full                                                        +---------+---------------+---------+-----------+----------+--------------+ FV Mid   Full                                                        +---------+---------------+---------+-----------+----------+--------------+ FV DistalFull                                                         +---------+---------------+---------+-----------+----------+--------------+ PFV      Full                                                        +---------+---------------+---------+-----------+----------+--------------+ POP      Full           Yes      Yes                                 +---------+---------------+---------+-----------+----------+--------------+ PTV      Full                                                        +---------+---------------+---------+-----------+----------+--------------+ PERO     Full                                                        +---------+---------------+---------+-----------+----------+--------------+   +---------+---------------+---------+-----------+----------+--------------+ LEFT     CompressibilityPhasicitySpontaneityPropertiesThrombus Aging +---------+---------------+---------+-----------+----------+--------------+ CFV      Full           Yes      Yes                                 +---------+---------------+---------+-----------+----------+--------------+ SFJ      Full                                                        +---------+---------------+---------+-----------+----------+--------------+ FV Prox  Full                                                        +---------+---------------+---------+-----------+----------+--------------+  FV Mid   Full                                                        +---------+---------------+---------+-----------+----------+--------------+ FV DistalFull                                                        +---------+---------------+---------+-----------+----------+--------------+ PFV      Full                                                        +---------+---------------+---------+-----------+----------+--------------+ POP      Full           Yes      Yes                                  +---------+---------------+---------+-----------+----------+--------------+ PTV      Full                                                        +---------+---------------+---------+-----------+----------+--------------+ PERO     Full                                                        +---------+---------------+---------+-----------+----------+--------------+     Summary: RIGHT: - There is no evidence of deep vein thrombosis in the lower extremity.  - No cystic structure found in the popliteal fossa.  LEFT: - There is no evidence of deep vein thrombosis in the lower extremity.  - No cystic structure found in the popliteal fossa.  *See table(s) above for measurements and observations. Electronically signed by Jamelle Haring on 12/29/2020 at 12:00:16 PM.    Final     ASSESSMENT AND PLAN: 1.  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 -Bladder neck, urethra, cervical biopsies 12/17/2020 -MSS, tumor mutation burden 4, K-ras amplification, PD-L1 tumor proportion score-0 -[facility] -90% intestinal malignancy (colorectal adenocarcinoma 85%) small intestine adenocarcinoma less than 5%, gastroesophageal adenocarcinoma not excluded -12/05/2020 MRI of the pelvis-poorly marginated enhancing 3.7 x 2.8 x 3.0 cm mass centered at the urethra, solid avidly enhancing bilateral ovarian masses, poorly marginated enhancing 2.4 x 2.3 x 3.0 cm mass in the left uterine cervix, mild to moderate bilateral common iliac, bilateral external iliac, and bilateral inguinal lymphadenopathy, diffuse patchy confluent nodular replacement of the pelvic osseous structures. -12/13/2020 CEA 1864, CA125 29.2 -12/16/2020 CT abdomen/pelvis-rapidly increasing size of the left ovary with some adjacent ascites, enlarging masses elsewhere, masslike area of the cervix and potentially within a urethral diverticulum, well-circumscribed left adrenal lesion measuring 3.2 x 2.6 cm, heterogeneous  pattern  of subtle sclerosis and lucency in the spine. -12/16/2020 MRI of the lumbar spine-diffusely abnormal appearance of the bone marrow throughout the visualized lumbar spine and pelvis highly suspicious for diffuse osseous metastatic disease, retroperitoneal/iliac adenopathy with left larger than right adnexal masses. -12/17/2020 CT chest-no acute intrathoracic pathology -Upper endoscopy 12/22/2020-gastritis, gastric nodule biopsy-adenocarcinoma, poorly differentiated with signet ring morphology -Colonoscopy 12/22/2020-polyps removed from the ascending and sigmoid colon, extrinsic compression of the sigmoid colon-tubulovillous adenoma without high-grade dysplasia, tubular adenoma and hyperplastic polyps -12/27/2020 cycle #1 FOLFOX -01/10/2021 cycle #2 FOLFOX 2.  Abdominal pain secondary #1 3.  Constipation 4.  Normocytic anemia 5.  Leukocytosis 6.  Protein calorie malnutrition 7.  Obstructive sleep apnea 8.  Diabetes mellitus 9.  Hypertension 10.  Possible pulmonary hypertension noted on CT chest  Ms. Merkle appears unchanged.  Today is day 4 of cycle #2 of FOLFOX.  She has tolerated her chemotherapy well overall.  She does not need daily lab work.  Pain continues to be well controlled.  Recommend continuation of current pain medication regimen.  Continue bowel regimen.  Recommend for her to increase ambulation.  She is okay to discharge from our standpoint once disposition has been decided.  We will arrange for outpatient follow-up with the cancer Center post hospital discharge.  Recommendations: 1.  Continue current pain medication and will consider decreasing OxyContin if breakthrough pain medication use decreases. 2.  Plan for outpatient PET scan  3.  No need for daily lab work from our standpoint. 4.  Continue bowel regimen. 5.  Increase ambulation. 6.  Okay to discharge from our standpoint with outpatient follow-up once disposition has been decided.  Future Appointments  Date  Time Provider [place]  02/18/2021 10:45 AM [name], PA-C [facility]      LOS: 28 days   [name] 01/13/21  Ms. Shetley was interviewed and examined.  She is now at day 4 following cycle 2 FOLFOX.  She tolerated the chemotherapy without significant acute toxicity.  Outpatient follow-up will be scheduled at the Cancer center to continue FOLFOX chemotherapy.  We will check the CEA prior to cycle 3.  I encouraged her to increase ambulation as tolerated.  She may be able to go home with help from a family member.  I was present for greater than 50% of today's visit.  I performed medical decision making.

## 2021-01-13 NOTE — Progress Notes (Signed)
PROGRESS NOTE    Erin [name]  SAY:[id number] DOB: 01/19/1966 DOA: 12/16/2020 PCP: Pcp, No   Brief Narrative:  This 55 years old female with PMH significant for OSA on CPAP, HTN, DMII, recent diagnosis of ovarian anterolateral mass, morbid obesity who presented to [facility] ED with lower abdominal pain and lower extremity pain.  She has been followed by Dr. Berline Lopes with gynecology oncology for bilateral ovarian masses and endometrial mass.  Was prescribed oxycodone by gynecology oncology for her pain but it was not fully effective so she presented to the ED for further evaluation.  Work-up revealed rapid increase in the size of the left ovary with some adjacent ascites, masslike area of the cervix and potentially within the urethral diverticulum seen on CT abdomen and pelvis, not seen on prior MRI.  Left adrenal lesion measuring 3.2 x 2.6 cm, lucency in the spine concerning for metastatic disease.  MRI of the lumbar spine 12/16/2020 showed diffusely abnormal appearance of the marrow highly suspicious for diffuse osseous metastatic disease.  Retroperitoneal/iliac adenopathy with left larger than right adnexal masses.  Was taken to the OR on 12/17/2020 for EUA and cervical biopsies as well as diagnostic cystoscopy, bladder biopsy, endometrial biopsy.  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  Ongoing chemotherapy.  Plan to continue chemotherapy. Last chemotherapy session on 01/10/2021.  Hospital course complicated by drop in hemoglobin 6.8 K on 01/08/2021, 2 units PRBCs ordered to be transfused with patient's verbal consent.  Day 4 of cycle #2 of FOLFOX on 01/13/2021.  01/13/2021: Seen at bedside.  No acute events overnight.  No new complaints.  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 MRI showed poorly marginated enhancing 3.7 x 2.8 x 3.0 cm mass centered at urethra concerning for malignancy -- solid avidly enhancing bilateral ovarian masses, largest 2.3 cm on Rt and 8.3 cm on Lt concerning for bilateral ovarian mets -- poorly marginated enhancing 2.4x2.3x3.0 cm mass in L uterine cervix, concerning for malignancy -- bilateral common iliac, bilateral external iliac, and bilateral inguinal LAD, concerning for metastatic disease -- diffuse patchy confluent nodular replacement of the pelvic osseus structures, concerning for osseus metastatic disease (see report) - Biopsy taken during cystoscopy on 12/17/2020 - biopsy has returned on 11/11. 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  gastric nodule bx and notable for adenocarcinoma, poorly differentiated with signet ring morphology.  Focal invastion of muscularis mucosa.  Muscularis propia not present in bx.  Per path reports, this most likely represents primary source of metastatic carcinoma dx on bx of vagina on 11/8. - Oncology following, now s/p port placement 11/17 Appreciate oncology's assistance. Chemo 01/10/21.  Hemoglobin stable  8.7.   Suspected metastatic gastric cancer : Cycle 1 folfox started 12/27/20  Cycle 2 FOLFOX 01/10/2021.  Acute blood loss anemia in the  setting of chemotherapy Hemoglobin dropped this morning 6.8, 2 units PRBC ordered to be transfused on 01/08/2021.  Repeat CBC posttransfusion. Transfused 1 unit PRBC previously during this admission. Hemoglobin stable 8.7.  Fever: > Resolved. Patient has spiked fever could be related to malignancy. Hold abx for now, consider starting if recurrent or spike > 101 F 1 set of cultures from 11/21 with gram positive cocci with staph species (suspect contaminant) Repeat blood cultures NGTD Urine culture contaminated. Repeat CXR with subsegmental atelectasis.  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  - gastric nodule bx and notable for adenocarcinoma, poorly differentiated with signet ring morphology.  Focal invastion of muscularis mucosa.  Muscularis propia not present in bx.  Per path reports, this most likely represents primary source of metastatic carcinoma dx on bx of vagina on 11/8. - Onco recommended Outpatient PET study as outpatient   Chronic pain syndrome: Continue long-acting oxycontin 40 mg twice daily. Reduce short acting oxycodone to 10 mg every 6 as needed Palliative care consulted for pain management. LE [place] for LE pain negative.  Trial flexeril.  Increase gabapentin. Continue pain control as needed   Panic attacks: Continue benzo as needed  Gastritis: EGD with gastritis PPI x 6 weeks then 40 mg daily Colonoscopy with 5 polyps removed (1 in ascending and 4 in sigmoid) bx as noted above   Ovarian mass: Noted on MRI pelvis Workup as noted above.   Acute lower UTI-resolved as of 12/24/2020 S/p ceftriaxone Culture with strep viridans resolved    Malnutrition of moderate degree - Moderate malnutrition in context of acute illness/injury, morbid obesity - Evaluated by dietitian, appreciate assistance - per RD: "Moderate Malnutrition related  to acute illness as evidenced by percent weight loss, moderate muscle depletion, energy intake < 75% for > 7 days."    OSA (obstructive sleep apnea) Continue CPAP.   Diabetes mellitus ([place]) Continue SSI   Essential hypertension BP is at goal Continue home regimen Continue amlodipine, coreg  Physical debility/ambulatory dysfunction Per CIR cannot do chemoinfusion on CIR. SNF cannot administer chemotherapy/chemoinfusion. Continue PT OT with assistance and fall precautions     DVT prophylaxis: Lovenox subcu daily Code Status: Full code Family Communication: No family at bedside. Disposition Plan:   Status is: Inpatient  Remains inpatient appropriate because: Patient is medically clear.   Yes.  Awaiting CIR.  Patient need to continue chemotherapy which cannot be completed in CIR. TOC is working on finding SNF.  Patient is medically clear for discharge : Yes  Consultants:  Oncology IR Urology GI Palliative care D1 oncology  Procedures:  Diagnostic cystoscopy, bladder biopsy, urethral biopsy, 12/17/20 Cervical biopsies, 12/17/2020 EGD and colonoscopy 12/22/20 Port placement by IR 11/17   Antimicrobials:  Anti-infectives (From admission, onward)    Start     Dose/Rate Route Frequency Ordered Stop   12/23/20 1000  cefTRIAXone (ROCEPHIN) 1 g in sodium chloride 0.9 % 100 mL IVPB        1 g 200 mL/hr over 30 Minutes Intravenous Every 24 hours 12/23/20 0746 12/24/20 1030   12/20/20 1000  cefTRIAXone (ROCEPHIN) 1 g in sodium chloride 0.9 % 100 mL IVPB        1 g 200 mL/hr over 30 Minutes Intravenous Every 24 hours 12/20/20  2025 12/22/20 1451   12/17/20 0930  ceFAZolin (ANCEF) 3 g in dextrose 5 % 50 mL IVPB  Status:  Discontinued        3 g 100 mL/hr over 30 Minutes Intravenous  Once 12/17/20 0925 12/17/20 0926   12/17/20 0915  ceFAZolin (ANCEF) IVPB 3g/100 mL premix        3 g 200 mL/hr over 30 Minutes Intravenous  Once 12/17/20 0914 12/17/20 0935         Objective: Vitals:   01/12/21 0641 01/12/21 1356 01/12/21 1951 01/13/21 0458  BP: 133/79 (!) 146/98 (!) 154/85 140/87  Pulse: 88 92 100 95  Resp: 17 17 16 18   Temp:  98.2 F (36.8 C) 98.2 F (36.8 C) 98.3 F (36.8 C)  TempSrc:  Oral Oral Oral  SpO2: 97% 96% 94% 98%  Weight:      Height:        Intake/Output Summary (Last 24 hours) at 01/13/2021 1642 Last data filed at 01/13/2021 0524 Gross per 24 hour  Intake --  Output 800 ml  Net -800 ml   Filed Weights   12/16/20 1307 12/17/20 0841 12/22/20 0928  Weight: 127 kg 127 kg 127 kg    Examination: No significant change from prior exam  General exam: Well-developed well-nourished in no acute distress.  She is alert and oriented x3.   Respiratory system: Clear to auscultation with no wheezes or rales.  Good respiratory effort.   Cardiovascular system: Regular rate and rhythm no rubs or gallops.  No JVD or thyromegaly noted.   Gastrointestinal system: Obese nontender bowel sounds present.   Central nervous system: Alert and oriented.  Nonfocal exam..  Extremities: Trace lower extremity edema bilaterally.   Skin: No rashes or ulcerative lesions noted. Psychiatry: Mood is appropriate for condition and setting.   Data Reviewed: I have personally reviewed following labs and imaging studies  CBC: Recent Labs  Lab 01/08/21 0451 01/08/21 0830 01/09/21 0408 01/11/21 0630  WBC 6.4  --  6.1 5.7  NEUTROABS  --   --  3.6  --   HGB 6.8* 6.8* 8.7* 8.7*  HCT 22.1* 22.1* 27.0* 27.6*  MCV 96.5  --  93.1 93.9  PLT 186  --  185 427   Basic Metabolic Panel: Recent Labs  Lab 01/08/21 0451 01/10/21 0449 01/11/21 0549  NA 138 136 137  K 3.6 3.3* 4.0  CL 106 104 106  CO2 25 25 25   GLUCOSE 111* 113* 122*  BUN 16 14 12   CREATININE 0.77 0.81 0.74  CALCIUM 9.2 9.2 9.6  MG 2.1  --  2.0  PHOS 3.4  --   --    GFR: Estimated Creatinine Clearance: 106.1 mL/min (by C-G formula based on SCr of 0.74 mg/dL). Liver Function  Tests: Recent Labs  Lab 01/10/21 0449  AST 17  ALT 15  ALKPHOS 476*  BILITOT 0.8  PROT 5.9*  ALBUMIN 2.3*    No results for input(s): LIPASE, AMYLASE in the last 168 hours. No results for input(s): AMMONIA in the last 168 hours. Coagulation Profile: No results for input(s): INR, PROTIME in the last 168 hours.  Cardiac Enzymes: No results for input(s): CKTOTAL, CKMB, CKMBINDEX, TROPONINI in the last 168 hours. BNP (last 3 results) No results for input(s): PROBNP in the last 8760 hours. HbA1C: No results for input(s): HGBA1C in the last 72 hours. CBG: Recent Labs  Lab 01/12/21 1144 01/12/21 1633 01/12/21 2113 01/13/21 0825 01/13/21 1217  GLUCAP 136* 107* 158*  110* 139*   Lipid Profile: No results for input(s): CHOL, HDL, LDLCALC, TRIG, CHOLHDL, LDLDIRECT in the last 72 hours. Thyroid Function Tests: No results for input(s): TSH, T4TOTAL, FREET4, T3FREE, THYROIDAB in the last 72 hours. Anemia Panel: No results for input(s): VITAMINB12, FOLATE, FERRITIN, TIBC, IRON, RETICCTPCT in the last 72 hours. Sepsis Labs: No results for input(s): PROCALCITON, LATICACIDVEN in the last 168 hours.  No results found for this or any previous visit (from the past 240 hour(s)).   Radiology Studies: No results found.  Scheduled Meds:  amLODipine  5 mg Oral Daily   carvedilol  6.25 mg Oral BID WC   Chlorhexidine Gluconate Cloth  6 each Topical Daily   enoxaparin (LOVENOX) injection  60 mg Subcutaneous Q24H   feeding supplement  237 mL Oral TID BM   gabapentin  300 mg Oral QHS   insulin aspart  0-9 Units Subcutaneous TID WC   oxyCODONE  40 mg Oral Q12H   pantoprazole  40 mg Oral BID   polyethylene glycol  17 g Oral BID   senna-docusate  2 tablet Oral BID   sodium chloride flush  10-40 mL Intracatheter Q12H   Continuous Infusions:     LOS: 28 days    Time spent: 25 mins    Kayleen Memos, MD [facility]   If 7PM-7AM, please contact night-coverage

## 2021-01-14 ENCOUNTER — Other Ambulatory Visit (HOSPITAL_COMMUNITY): Payer: Self-pay

## 2021-01-14 ENCOUNTER — Encounter: Payer: Self-pay | Admitting: Oncology

## 2021-01-14 LAB — CBC
HCT: 27 % — ABNORMAL LOW (ref 36.0–46.0)
Hemoglobin: 8.6 g/dL — ABNORMAL LOW (ref 12.0–15.0)
MCH: 30.1 pg (ref 26.0–34.0)
MCHC: 31.9 g/dL (ref 30.0–36.0)
MCV: 94.4 fL (ref 80.0–100.0)
Platelets: 227 10*3/uL (ref 150–400)
RBC: 2.86 MIL/uL — ABNORMAL LOW (ref 3.87–5.11)
RDW: 15.2 % (ref 11.5–15.5)
WBC: 3.3 10*3/uL — ABNORMAL LOW (ref 4.0–10.5)
nRBC: 0 % (ref 0.0–0.2)

## 2021-01-14 LAB — BASIC METABOLIC PANEL
Anion gap: 8 (ref 5–15)
BUN: 12 mg/dL (ref 6–20)
CO2: 25 mmol/L (ref 22–32)
Calcium: 8.9 mg/dL (ref 8.9–10.3)
Chloride: 105 mmol/L (ref 98–111)
Creatinine, Ser: 0.78 mg/dL (ref 0.44–1.00)
GFR, Estimated: >60 mL/min (ref >60)
Glucose, Bld: 119 mg/dL — ABNORMAL HIGH (ref 70–99)
Potassium: 3.5 mmol/L (ref 3.5–5.1)
Sodium: 138 mmol/L (ref 135–145)

## 2021-01-14 LAB — GLUCOSE, CAPILLARY
Glucose-Capillary: 125 mg/dL — ABNORMAL HIGH (ref 70–99)
Glucose-Capillary: 118 mg/dL — ABNORMAL HIGH (ref 70–99)

## 2021-01-14 MED ORDER — PANTOPRAZOLE SODIUM 40 MG PO TBEC
40.0000 mg | DELAYED_RELEASE_TABLET | Freq: Every day | ORAL | 0 refills | Status: AC
  Filled 2021-01-14 (×2): qty 90, 90d supply, fill #0

## 2021-01-14 MED ORDER — PANTOPRAZOLE SODIUM 40 MG PO TBEC: 40.0000 mg | DELAYED_RELEASE_TABLET | Freq: Every day | ORAL | Status: DC

## 2021-01-14 MED ORDER — ZINC OXIDE 40 % EX OINT
TOPICAL_OINTMENT | CUTANEOUS | 0 refills | Status: AC | PRN
  Filled 2021-01-14: qty 56.7, fill #0

## 2021-01-14 MED ORDER — POLYETHYLENE GLYCOL 3350 17 G PO PACK
17.0000 g | PACK | Freq: Every day | ORAL | 0 refills | Status: DC | PRN
  Filled 2021-01-14: qty 14, 14d supply, fill #0

## 2021-01-14 MED ORDER — CYCLOBENZAPRINE HCL 5 MG PO TABS
5.0000 mg | ORAL_TABLET | Freq: Three times a day (TID) | ORAL | 0 refills | Status: DC | PRN
Start: 2021-01-14 — End: 2021-01-27
  Filled 2021-01-14 (×2): qty 9, 3d supply, fill #0

## 2021-01-14 MED ORDER — OXYCODONE HCL 5 MG PO TABS
5.0000 mg | ORAL_TABLET | Freq: Three times a day (TID) | ORAL | 0 refills | Status: DC | PRN
Start: 2021-01-14 — End: 2021-01-16
  Filled 2021-01-14 – 2021-01-15 (×2): qty 9, 3d supply, fill #0

## 2021-01-14 MED ORDER — HEPARIN SOD (PORK) LOCK FLUSH 100 UNIT/ML IV SOLN
500.0000 [IU] | INTRAVENOUS | Status: DC | PRN
  Filled 2021-01-14: qty 5

## 2021-01-14 MED ORDER — DICLOFENAC SODIUM 1 % EX GEL
2.0000 g | Freq: Four times a day (QID) | CUTANEOUS | 0 refills | Status: AC | PRN
  Filled 2021-01-14: qty 50, fill #0

## 2021-01-14 MED ORDER — GABAPENTIN 300 MG PO CAPS
300.0000 mg | ORAL_CAPSULE | Freq: Every day | ORAL | 0 refills | Status: AC
  Filled 2021-01-14 (×2): qty 30, 30d supply, fill #0

## 2021-01-14 MED ORDER — SENNOSIDES-DOCUSATE SODIUM 8.6-50 MG PO TABS
2.0000 | ORAL_TABLET | Freq: Every day | ORAL | 0 refills | Status: AC
  Filled 2021-01-14: qty 60, 30d supply, fill #0

## 2021-01-14 NOTE — TOC Initial Note (Signed)
Transition of Care [facility]) - Initial/Assessment Note    Patient Details  Name: Erin [name] MRN: [id number] Date of Birth: [birth year]  Transition of [facility]) CM/SW Contact:    Lynnell Catalan, RN Phone Number: 01/14/2021, 11:59 AM  Clinical Narrative:                 Spoke with pt at bedside for dc planning. Physical therapy recommendations of home physical therapy explained to pt. Arranged with [facility] for pt to have charity HHPT/Aide for home. Pt will also receive charity 3in1 and RW to be delivered to pt room. Pt is working on transportation home and asking family to check in on her intermittently.   Expected Discharge Plan: [place] Barriers to Discharge: No Barriers Identified   Patient Goals and CMS Choice Patient states their goals for this hospitalization and ongoing recovery are:: Have niece become HCPOA   Choice offered to / list presented to : Patient  Expected Discharge Plan and Services Expected Discharge Plan: [place] In-house Referral: Clinical Social Work Discharge Planning Services: CM Consult Post Acute Care Choice: IP Rehab Living arrangements for the past 2 months: Apartment Expected Discharge Date: 01/14/21               DME Arranged: Gilford Rile rolling, 3-N-1 DME Agency: [employer] Date DME Agency Contacted: 01/14/21 Time DME Agency Contacted: 1025 Representative spoke with at DME Agency: Adela Lank HH Arranged: PT, Nurse's Aide [place] Agency: [place] ([place]) Date [place]: 01/14/21 Time [place]: 45 Representative spoke with at [place]: Ramond Marrow  Prior Living Arrangements/Services Living arrangements for the past 2 months: Apartment   Patient language and need for interpreter reviewed:: Yes        Need for Family Participation in Patient Care: Yes (Comment) Care giver support system in place?: Yes (comment)   Criminal Activity/Legal Involvement Pertinent to Current  Situation/Hospitalization: No - Comment as needed  Activities of Daily Living Home Assistive Devices/Equipment: Eyeglasses, CPAP (using golf club as a cane,) ADL Screening (condition at time of admission) Patient's cognitive ability adequate to safely complete daily activities?: Yes Is the patient deaf or have difficulty hearing?: No Does the patient have difficulty seeing, even when wearing glasses/contacts?: No Does the patient have difficulty concentrating, remembering, or making decisions?: No Patient able to express need for assistance with ADLs?: Yes Does the patient have difficulty dressing or bathing?: Yes Independently performs ADLs?: No Communication: Independent Dressing (OT): Needs assistance Is this a change from baseline?: Change from baseline, expected to last >3 days Grooming: Independent Is this a change from baseline?: Change from baseline, expected to last >3 days Feeding: Needs assistance Is this a change from baseline?: Change from baseline, expected to last >3 days Bathing: Needs assistance Is this a change from baseline?: Change from baseline, expected to last >3 days Toileting: Needs assistance Is this a change from baseline?: Change from baseline, expected to last >3days In/Out Bed: Needs assistance Is this a change from baseline?: Change from baseline, expected to last >3 days Walks in Home: Dependent Is this a change from baseline?: Change from baseline, expected to last >3 days Does the patient have difficulty walking or climbing stairs?: Yes Weakness of Legs: Both Weakness of Arms/Hands: Both  Permission Sought/Granted Permission sought to share information with : Investment banker, corporate granted to share info w AGENCY: CIR        Emotional  Assessment Appearance:: Appears stated age Attitude/Demeanor/Rapport: Gracious Affect (typically observed): Calm Orientation: : Oriented to Self, Oriented to Place, Oriented to  Time,  Oriented to Situation Alcohol / Substance Use: Not Applicable Psych Involvement: No (comment)  Admission diagnosis:  Lower abdominal pain [R10.30] Abdominal pain [R10.9] Patient Active Problem List   Diagnosis Date Noted   Anemia 12/31/2020   Fever 12/30/2020   Panic attack 12/29/2020   Dysuria 12/28/2020   Gastric cancer ([place]) 12/27/2020   Cancer related pain 12/27/2020   Gastritis and gastroduodenitis    Adenocarcinoma ([place]) 12/20/2020   Metastatic adenocarcinoma of unknown origin ([place])    Malnutrition of moderate degree 12/18/2020   Elevated CEA 12/18/2020   Mass of urethra    Abdominal pain 12/16/2020   Ovarian mass 12/16/2020   Hypokalemia 12/16/2020   Tear of right rotator cuff 04/04/2020   Vitamin D deficiency 04/02/2020   Other fatigue 03/13/2020   Shortness of breath on exertion 03/13/2020   OSA (obstructive sleep apnea) 11/25/2017   Obesity hypoventilation syndrome ([place]) 11/25/2017   Mallet finger of right hand 09/03/2017   Anterolisthesis 04/17/2017   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 [place] [address] Phone: [phone] Fax: [phone]     Social Determinants of Health (SDOH) Interventions    Readmission Risk Interventions Readmission Risk Prevention Plan 01/06/2021  Transportation Screening Complete  Medication Review Press photographer) Complete  PCP or Specialist appointment within 3-5 days of discharge Complete  HRI or Home Care Consult Complete  SW Recovery Care/Counseling Consult Complete  Palliative Care Screening Not [place]  Complete  Some recent data might be hidden

## 2021-01-14 NOTE — Discharge Summary (Signed)
Discharge Summary  Erin [name] VWU:[id number] DOB: [birth year]  PCP: Pcp, No  Admit date: 12/16/2020 Discharge date: 01/14/2021  Time spent: 35 minutes.  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[place])    Malnutrition of moderate degree 12/18/2020   Elevated CEA 12/18/2020   Abdominal pain 12/16/2020   Ovarian mass 12/16/2020   Hypokalemia 12/16/2020   OSA (obstructive sleep apnea) 11/25/2017   Diabetes mellitus ([place]) 05/27/2016   Essential hypertension 04/13/2016    Resolved Hospital Problems   Diagnosis Date Noted Date Resolved   Acute lower UTI 12/21/2020 12/24/2020   Constipation 12/17/2020 12/24/2020    Discharge Condition: Stable  Diet recommendation: Resume previous diet  Vitals:   01/13/21 2238 01/14/21 0636  BP: (!) 142/74 (!) 142/86  Pulse: 93 93  Resp: 17 17  Temp: 98 F (36.7 C) 98.2 F (36.8 C)  SpO2: 91% 96%    History of present illness:  55 years old female with PMH significant for OSA on CPAP, HTN, DMII, recent diagnosis of ovarian anterolateral mass, morbid obesity who presented to [facility] ED with lower abdominal pain and lower extremity pain not improved with home p.o. narcotics.  She was followed by Dr. Berline Lopes with gynecology oncology for bilateral ovarian masses and endometrial mass.  Was prescribed oxycodone for her pain but with no improvement therefore, she presented to [facility] ED for further evaluation.     Work-up revealed rapid increase in the size of the left ovary mass with some adjacent ascites, masslike area of the cervix and potentially within the urethral diverticulum seen on CT abdomen and pelvis, not seen on prior MRI.  Left adrenal lesion measured 3.2 x 2.6 cm.  There was noted lucency in the spine concerning for metastatic disease.  MRI of the lumbar spine 12/16/2020 showed diffusely abnormal appearance of the marrow highly suspicious for diffuse osseous metastatic disease.  Also showed retroperitoneal/iliac adenopathy with left larger than right adnexal masses.  Was taken to the OR on 12/17/2020 for EUA and cervical biopsies as well as diagnostic cystoscopy, bladder biopsy, endometrial biopsy.  Seen by medical oncology, following.   -12/17/2020 CT chest-no acute intrathoracic pathology -Upper endoscopy 12/22/2020-gastritis, gastric nodule biopsy-adenocarcinoma, poorly differentiated with signet ring morphology -Colonoscopy 12/22/2020-polyps removed from the ascending and sigmoid colon, extrinsic compression of the sigmoid colon-tubulovillous adenoma without high-grade dysplasia, tubular adenoma and hyperplastic polyps -12/27/2020 cycle #1 FOLFOX -01/10/2021 cycle #2 FOLFOX -01/13/2021 day 4 of cycle #2 of FOLFOX.  She has tolerated her chemotherapy well overall.     Hospital course complicated by drop in hemoglobin 6.8 K on 01/08/2021, 2 units PRBCs transfused.  Hemoglobin 8.6 on 01/14/21.   01/14/2021: Patient was seen at bedside.  There were no acute events overnight.  Complains of chronic abdominal pain.  She has just received her pain medication.  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 MRI showed poorly marginated enhancing 3.7 x 2.8 x 3.0 cm mass centered at urethra concerning for malignancy -- solid avidly enhancing bilateral ovarian masses, largest 2.3 cm on Rt and 8.3 cm on Lt concerning for bilateral ovarian mets -- poorly marginated enhancing 2.4x2.3x3.0 cm mass in L uterine cervix, concerning for malignancy -- bilateral common iliac, bilateral external iliac, and bilateral inguinal LAD, concerning for metastatic disease -- diffuse patchy confluent nodular replacement of the pelvic osseus structures, concerning for osseus metastatic disease - Biopsy taken during cystoscopy on 12/17/2020 - biopsy has returned on 12/20/20. 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  Gastric nodule bx and notable for adenocarcinoma, poorly differentiated with signet ring morphology.  Focal invastion of muscularis mucosa.  Muscularis propia not present in bx.  Per path reports, this most likely represents primary source of metastatic carcinoma dx on bx of vagina on 12/17/20. - Oncology following, now s/p port placement 12/26/20 Ongoing chemotherapy as stated above. Follow-up with medical oncology outpatient.   Suspected metastatic gastric cancer : Cycle 1 folfox started 12/27/20  Cycle 2 FOLFOX 01/10/2021. -01/13/2021 day 4 of cycle #2 of FOLFOX.  She has tolerated her chemotherapy well overall.   Management per medical oncology.   Acute blood loss anemia in the setting of malignancy and  chemotherapy Received total of 3 units PRBC transfusion during this admission Hemoglobin 8.6 on 01/14/2021. No overt bleeding. Follow-up with your PCP and medical oncology   Fever: > Resolved. 1 set of cultures from 12/30/20 with gram positive cocci with staph species (suspect contaminant) Repeat blood cultures NGTD Afebrile with no leukocytosis nontoxic-appearing.   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  - Gastric nodule bx and notable for adenocarcinoma, poorly differentiated with signet ring morphology.  Focal invastion of muscularis mucosa.  Muscularis propia not present in bx.  Per path reports, this most likely represents primary source of metastatic carcinoma dx on bx of vagina on 11/8. - Onco recommended Outpatient PET study as outpatient   Chronic pain syndrome: Continue home pain management.  Gastritis: EGD with gastritis Daily PPI Colonoscopy with 5 polyps removed (1 in ascending and 4 in sigmoid) bx as noted above Follow-up with GI   Ovarian mass: Noted on MRI pelvis Workup as noted above.   Treated Viridans Streptococcus UTI S/p ceftriaxone    Malnutrition of moderate degree of malnutrition. - Moderate malnutrition in context of acute illness/injury, morbid obesity - Evaluated by dietitian, appreciate assistance - per RD: "Moderate Malnutrition related to acute illness as evidenced by percent weight loss, moderate muscle depletion, energy intake < 75% for > 7 days."    OSA (obstructive sleep apnea) Continue CPAP.   Diabetes mellitus ([place]) Continue SSI   Essential hypertension BP is at goal Continue home regimen Continue amlodipine, coreg  Physical debility/ambulatory dysfunction Per CIR cannot do chemoinfusion on CIR. SNF cannot administer chemotherapy/chemoinfusion. Continue PT OT with assistance and fall precautions         Code Status: Full code    Consultants:  Oncology IR Urology GI Palliative care   Procedures:  Diagnostic cystoscopy, bladder biopsy, urethral biopsy, 12/17/20 Cervical biopsies, 12/17/2020 EGD and colonoscopy 12/22/20 Port placement by IR 11/17     Discharge Exam: BP (!) 142/86 (BP Location: Right Arm)   Pulse 93   Temp 98.2 F (36.8 C) (Oral)   Resp 17   Ht 5\' 4"  (1.626 m)   Wt 127 kg   SpO2 96%   BMI 48.06 kg/m  General: 55 y.o. year-old female well developed well nourished in no acute distress.  Alert and oriented x3. Cardiovascular: Regular rate and rhythm with no rubs or gallops.  No thyromegaly or JVD noted.   Respiratory: Clear to auscultation with no wheezes or rales. Good inspiratory effort. Abdomen: Soft nondistended with normal bowel sounds x4 quadrants. Musculoskeletal: No lower extremity edema.  Skin: No ulcerative lesions noted or rashes Psychiatry: Mood is appropriate for condition and setting  Discharge Instructions You were cared for by a hospitalist during your hospital stay. If you have any questions about your discharge medications or the care you received while you were in the hospital after you are discharged, you can call the unit and asked to speak with the hospitalist on call if the hospitalist that took care of you is not available. Once you are discharged, your primary care physician will handle any further medical issues. Please note that NO REFILLS for any discharge medications will be authorized once you are discharged, as it is imperative that you return to your primary care physician (or establish a relationship with a primary care physician if you do not have one) for your aftercare needs so that they can reassess your need for medications and monitor your lab values.  Discharge Instructions     TREATMENT CONDITIONS   Complete by: As directed    Patient should have CBC & CMP within 7 days prior to chemotherapy administration. NOTIFY  MD IF: ANC < 1500, Hemoglobin < 8, PLT < 100,000,  Total Bili > 1.5, Creatinine > 1.5, ALT & AST > 80 or if patient has unstable vital signs: Temperature > 38.5, SBP > 180 or < 90, RR > 30 or HR > 100.   TREATMENT CONDITIONS   Complete by: As directed    Patient should have CBC & CMP within 7 days prior to chemotherapy administration. NOTIFY MD IF: ANC < 1500, Hemoglobin < 8, PLT < 100,000,  Total Bili > 1.5, Creatinine > 1.5, ALT & AST > 80 or if patient has unstable vital signs: Temperature > 38.5, SBP > 180 or < 90, RR > 30 or HR > 100.      Allergies as of 01/14/2021       Reactions   Tramadol Other (See Comments)   Per patient increased HR and sweating        Medication List     STOP taking these medications    buPROPion 200 MG 12 hr tablet Commonly known as: Wellbutrin SR   dexamethasone 1 MG tablet Commonly known as: DECADRON   hydrochlorothiazide 25 MG tablet Commonly known as: HYDRODIURIL   HYDROcodone-acetaminophen 5-325 MG tablet Commonly known as: NORCO/VICODIN   metroNIDAZOLE 500 MG tablet Commonly known as: Flagyl   olmesartan 40 MG tablet Commonly known as: [employer]  oxyCODONE-acetaminophen 5-325 MG tablet Commonly known as: PERCOCET/ROXICET   Ozempic (1 MG/DOSE) 4 MG/3ML Sopn Generic drug: Semaglutide (1 MG/DOSE)   phenazopyridine 200 MG tablet Commonly known as: PYRIDIUM   rosuvastatin 10 MG tablet Commonly known as: CRESTOR   traMADol 50 MG tablet Commonly known as: ULTRAM   Vitamin D (Ergocalciferol) 1.25 MG (50000 UNIT) Caps capsule Commonly known as: DRISDOL       TAKE these medications    amLODipine 5 MG tablet Commonly known as: NORVASC TAKE 1 TABLET (5 MG TOTAL) BY MOUTH DAILY. What changed: how much to take   carvedilol 6.25 MG tablet Commonly known as: COREG TAKE 1 TABLET BY MOUTH 2 TIMES DAILY WITH A MEAL. What changed: how much to take   cyclobenzaprine 5 MG tablet Commonly known as: FLEXERIL Take 1 tablet (5 mg  total) by mouth 3 (three) times daily as needed for up to 3 days for muscle spasms.   diclofenac Sodium 1 % Gel Commonly known as: VOLTAREN Apply 2 g topically 4 (four) times daily as needed (msk pain).   gabapentin 300 MG capsule Commonly known as: NEURONTIN Take 1 capsule (300 mg total) by mouth at bedtime.   liver oil-zinc oxide 40 % ointment Commonly known as: DESITIN Apply topically as needed for irritation.   metFORMIN 500 MG tablet Commonly known as: GLUCOPHAGE Take 1 tablet (500 mg total) by mouth daily.   oxyCODONE 5 MG immediate release tablet Commonly known as: Oxy IR/ROXICODONE Take 1 tablet (5 mg total) by mouth every 8 (eight) hours as needed for up to 3 days for severe pain. What changed: when to take this   pantoprazole 40 MG tablet Commonly known as: PROTONIX Take 1 tablet (40 mg total) by mouth daily.   polyethylene glycol 17 g packet Commonly known as: MIRALAX / GLYCOLAX Take 17 g by mouth daily as needed.   senna-docusate 8.6-50 MG tablet Commonly known as: Senokot-S Take 2 tablets by mouth at bedtime.               Durable Medical Equipment  (From admission, onward)           Start     Ordered   01/14/21 1156  For home use only DME 3 n 1  Once        01/14/21 1155   01/14/21 1155  For home use only DME Walker rolling  Once       Question Answer Comment  Walker: With 5 Inch Wheels   Patient needs a walker to treat with the following condition Weakness      01/14/21 1155           Allergies  Allergen Reactions   Tramadol Other (See Comments)    Per patient increased HR and sweating    Follow-up Information     Fay Records, MD Follow up.   Specialty: Cardiology Contact information: [address] [place] [address] [place] [address] [phone]         Ladell Pier, MD. Call today.   Specialty: Oncology Why: Please call for a posthospital follow-up appointment. Contact information: [place] [address] [phone]         Gerrit Heck V, DO. Call today.   Specialty: Gastroenterology Why: Please call for a posthospital follow-up appointment. Contact information: [place] [address] [phone]  The results of significant diagnostics from this hospitalization (including imaging, microbiology, ancillary and laboratory) are listed below for reference.    Significant Diagnostic Studies: DG Chest 2 View  Result Date: 12/16/2020 CLINICAL DATA:  Shortness of breath in a 55 year old female. EXAM: CHEST - 2 VIEW COMPARISON:  July 10, 2009. FINDINGS: Linear opacities in the LEFT chest of progressed slightly since the previous study. No lobar consolidation. Cardiomediastinal contours and hilar structures are stable. No sign of effusion. Mildly increased density in the subcarinal region on lateral projection. No acute skeletal process on limited assessment. IMPRESSION: Signs of scarring or atelectasis in the LEFT chest, slightly progressed since the previous study. Question developing subcarinal adenopathy, based on lateral projection. Developing lower lobe airspace disease is also a differential consideration. In a patient with pelvic masses on recent pelvic MRI would consider follow-up chest CT for further evaluation. Electronically Signed   By: Zetta Bills M.D.   On: 12/16/2020 14:13   CT CHEST W CONTRAST  Result Date: 12/17/2020 CLINICAL DATA:  Cancer of unknown primary.  Staging. EXAM: CT CHEST WITH CONTRAST TECHNIQUE: Multidetector CT imaging of the chest was performed during intravenous contrast administration. CONTRAST:  50mL OMNIPAQUE IOHEXOL 350 MG/ML SOLN COMPARISON:  Chest CT dated 06/01/2007. FINDINGS: Cardiovascular: There is no cardiomegaly or pericardial effusion. Three-vessel coronary vascular calcification. Mild atherosclerotic calcification of the thoracic aorta. No aneurysmal dilatation.  The origins of the great vessels of the aortic arch appear patent as visualized. There is dilatation of the main pulmonary trunk suggestive of pulmonary hypertension. Evaluation of the pulmonary arteries is limited due to respiratory motion artifact and suboptimal opacification and timing of the contrast. No large or central pulmonary artery embolus identified. Mediastinum/Nodes: No hilar or mediastinal adenopathy. The esophagus is grossly unremarkable. Prior right hemithyroidectomy. No mediastinal fluid collection. Lungs/Pleura: Bibasilar subpleural atelectasis/scarring. There is no pleural effusion pneumothorax. The central airways are patent. Upper Abdomen: Indeterminate 3 cm left renal nodule, present on the prior CT, likely a benign etiology such as adenoma. Musculoskeletal: Degenerative changes of the spine. No acute osseous pathology. IMPRESSION: 1. No acute intrathoracic pathology. No CT evidence of central pulmonary artery embolus. 2. Dilatation of the main pulmonary trunk suggestive of pulmonary hypertension. 3. Status post prior right hemithyroidectomy. 4. Bilateral linear atelectasis/scarring. 5. Aortic Atherosclerosis (ICD10-I70.0). Electronically Signed   By: Anner Crete M.D.   On: 12/17/2020 21:09   MR Lumbar Spine W Wo Contrast  Result Date: 12/16/2020 CLINICAL DATA:  Initial evaluation for low back pain, cancer suspected. EXAM: MRI LUMBAR SPINE WITHOUT AND WITH CONTRAST TECHNIQUE: Multiplanar and multiecho pulse sequences of the lumbar spine were obtained without and with intravenous contrast. CONTRAST:  67mL GADAVIST GADOBUTROL 1 MMOL/ML IV SOLN COMPARISON:  Prior CT from earlier the same day. FINDINGS: Segmentation: Standard. Lowest well-formed disc space labeled the L5-S1 level. Alignment: 4 mm facet mediated anterolisthesis of L4 on L5. Alignment otherwise normal with preservation of the normal lumbar lordosis. Vertebrae: Diffusely abnormal appearance of the bone marrow is seen  throughout the visualized lumbar spine and pelvis. Associated heterogeneous STIR hyperintensity with irregular heterogeneous postcontrast enhancement. Findings are highly suspicious for diffuse osseous metastatic disease. Involvement appears to be most pronounced within the T12 vertebral body as well as the visualized pelvis. Exact measurements of a discrete lesion is difficult given the overall infiltrative appearance of this finding. No associated pathologic fracture. No visible extra osseous extension of tumor at this time. Conus medullaris and cauda equina: Conus extends to the L1 level.  Conus and cauda equina appear normal. No visible epidural or intracanalicular tumor. Paraspinal and other soft tissues: Mild edema within the subcutaneous fat of the lower back, which could be related to overall volume status. Paraspinous soft tissues demonstrate no other acute finding. Retroperitoneal/iliac adenopathy noted. Left larger than right adnexal masses partially visualized. Findings better characterized on prior CT. Disc levels: L1-2:  Unremarkable. L2-3: Disc desiccation with mild disc bulge. Superimposed small right foraminal to extraforaminal disc protrusion (series 11, image 17). Mild facet hypertrophy. Underlying short pedicles with a degree of mild spinal stenosis. Mild bilateral L2 foraminal narrowing. L3-4: Disc desiccation with mild disc bulge. Superimposed shallow left extraforaminal disc protrusion with annular fissure (series 11, image 24). Mild facet hypertrophy. Underlying short pedicles. Mild spinal stenosis. Foramina remain patent. L4-5: 4 mm anterolisthesis. Disc desiccation with broad-based posterior pseudo disc bulge. Biforaminal annular fissures noted, left larger than right. Moderate bilateral facet arthrosis. Resultant moderate spinal stenosis. Mild to moderate bilateral L4 foraminal narrowing. L5-S1: Normal interspace. Mild right greater than left facet hypertrophy. No stenosis. IMPRESSION: 1.  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 No associated pathologic fracture or extra osseous extension of tumor at this time. 2. Retroperitoneal/iliac adenopathy with left larger than right adnexal masses, better characterized on prior CT. 3. Underlying multilevel degenerative spondylosis as above, most pronounced at L4-5 where there is resultant moderate spinal stenosis. Electronically Signed   By: Jeannine Boga M.D.   On: 12/16/2020 23:44   US Pelvis Complete  Result Date: 12/16/2020 CLINICAL DATA:  Pelvic pain EXAM: TRANSABDOMINAL ULTRASOUND OF PELVIS DOPPLER ULTRASOUND OF OVARIES TECHNIQUE: Transabdominal ultrasound examination of the pelvis was performed including evaluation of the uterus, ovaries, adnexal regions, and pelvic cul-de-sac. Color and duplex Doppler ultrasound was utilized to evaluate blood flow to the ovaries. COMPARISON:  CT 12/16/2020, MRI 12/05/2020, pelvic ultrasound 12/01/2020 FINDINGS: Uterus Measurements: 6.8 x 3.4 x 5 cm = volume: 60.5 mL. No fibroids or other mass visualized. Endometrium Poorly visible, unable to measure. Right ovary Measurements: 4.8 x 2.9 x 5.5 cm = volume: 38.6 mL. Solid right adnexal mass replaces most of right ovary. Left ovary Measurements: 10.5 x 8.7 x 9.2 cm = volume: 440 mL. Enlarged left ovary with heterogenous solid mass that replaces the left ovary. Previously the left ovary measured 8.3 x 7.7 x 8.5 cm on MRI. Pulsed Doppler evaluation demonstrates normal arterial and venous waveforms in both or adnexa, though note that both ovaries are essentially replaced by mass lesions. Other: No significant ascites. Foley catheter within the bladder which appears somewhat thick walled. IMPRESSION: 1. Very limited exam secondary to habitus and only trans abdominal technique 2. Bilateral solid adnexal masses essentially replacing the ovaries which  thereby limits assessment of the ovaries. Left adnexal solid mass lesion has significantly increased in size; documented flow in the adnexa relates to flow within the solid adnexal masses. Gynecology follow-up recommended. Electronically Signed   By: Donavan Foil M.D.   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 EXAM: CT ABDOMEN AND PELVIS WITH CONTRAST TECHNIQUE: Multidetector CT imaging of the abdomen and pelvis was performed using the standard protocol following bolus administration of intravenous contrast. CONTRAST:  54mL OMNIPAQUE IOHEXOL 350 MG/ML SOLN COMPARISON:  Comparison is made with multiple prior studies which were  performed recently, most recent comparison is an MRI of the pelvis of December 05, 2020. FINDINGS: Lower chest: Basilar atelectasis. Subcarinal region not well evaluated. No consolidation or effusion at the lung bases. Hepatobiliary: No focal, suspicious hepatic lesion. No pericholecystic stranding. Portal vein is patent. No biliary duct dilation. Pancreas: Pancreas with normal contours. No signs of adjacent inflammation. Spleen: Spleen normal size and contour. Adrenals/Urinary Tract: Well-circumscribed LEFT adrenal lesion, density value of 19 Hounsfield units on previous noncontrast imaging measuring 3.2 x 2.6 cm., relative washout is indeterminate at 30%. Normal RIGHT adrenal. Normal, symmetric enhancement of bilateral kidneys without focal renal lesion. Stomach/Bowel: Small hiatal hernia. No stranding adjacent to the stomach. No sign of small bowel obstruction or acute small bowel process. The appendix is normal. Stool in various parts of the colon without signs of obstruction or adjacent stranding. Vascular/Lymphatic: Retroperitoneal adenopathy (image 50/2) 14 mm lymph node previously 11 mm along the LEFT common iliac chain. LEFT  pelvic sidewall lymph node (image 75/2) 14 mm short axis, previously 13 mm. Similar size of RIGHT pelvic sidewall/external iliac lymph nodes also with little change compared to the most recent comparison imaging. Reproductive: Foley catheter in situ. Uterus and cervix not well at assess nor are the masses within the area of the cervix and urethra seen on previous MRI. Increasing size of LEFT ovary with heterogeneous appearance now measuring 10 x 8.8 cm. When assessed on the study of October 20, 2020 the ovary for reference measured 3.7 x 2.5 cm. RIGHT ovary with area of enhancement shows enlargement as well but not as pronounced as on previous imaging with 2.8 cm area of enhancement in the RIGHT ovary as the largest discrete area which is similar to the recent MRI evaluation. As compared to the study of October 20, 2020 there is little change in the appearance of the RIGHT ovary aside from this area though there is an increasingly nodular appearance of the ovary in this location. Mass posterior to the LEFT ovary just above the uterus measuring 4.4 x 2.8 cm has enlarged from 2.5 cm. Other: Small volume ascites. Musculoskeletal: Heterogeneous pattern associated with the spine in terms of density raises the question of metastatic disease without discrete measurable focal lesion in the spine. For example a geographic area of sclerosis on image 101/5) in the anterior L3 vertebral body does raise the question of underlying lesion with numerous additional foci, essentially nearly all levels of the spine with some degree of heterogeneity. Another example of this finding is noted on the RIGHT at the T10 level. No acute bone finding or frankly destructive bone process. IMPRESSION: Rapid increase in size of the LEFT ovary with some adjacent ascites. The rapid enlargement is seen in the setting of generalized worsening of masses associated with the LEFT and RIGHT ovary but perhaps more pronounced than other areas in the  pelvis. The possibility of torsion associated with an ovarian mass could be considered based on this finding. Would correlate with any worsening abdominal pain and with sonogram as warranted for further assessment. Enlarging masses elsewhere though not to the extent that is seen with the LEFT ovary. Masslike area of the cervix and potentially within a urethral diverticulum not as well seen as on the recent MRI evaluation. Small volume ascites. Well-circumscribed LEFT adrenal lesion measuring 3.2 x 2.6 cm, density value of 19 Hounsfield units on previous noncontrast imaging. Relative washout is indeterminate at 30%. This may represent an adrenal adenoma. Consider dedicated adrenal protocol CT or attention on  PET evaluation if performed. Heterogeneous pattern of subtle sclerosis and lucency in the spine on today's study raising the question of bony metastatic disease in the spine. Findings of increased ovarian size on the LEFT out of proportion other areas were called by telephone at the time of interpretation on 12/16/2020 at 5:44 pm to provider Theodis Blaze , who verbally acknowledged these results. Electronically Signed   By: Zetta Bills M.D.   On: 12/16/2020 17:46   [place] Art/Ven Flow Abd Pelv Doppler  Result Date: 12/16/2020 CLINICAL DATA:  Pelvic pain EXAM: TRANSABDOMINAL ULTRASOUND OF PELVIS DOPPLER ULTRASOUND OF OVARIES TECHNIQUE: Transabdominal ultrasound examination of the pelvis was performed including evaluation of the uterus, ovaries, adnexal regions, and pelvic cul-de-sac. Color and duplex Doppler ultrasound was utilized to evaluate blood flow to the ovaries. COMPARISON:  CT 12/16/2020, MRI 12/05/2020, pelvic ultrasound 12/01/2020 FINDINGS: Uterus Measurements: 6.8 x 3.4 x 5 cm = volume: 60.5 mL. No fibroids or other mass visualized. Endometrium Poorly visible, unable to measure. Right ovary Measurements: 4.8 x 2.9 x 5.5 cm = volume: 38.6 mL. Solid right adnexal mass replaces most of right ovary. Left  ovary Measurements: 10.5 x 8.7 x 9.2 cm = volume: 440 mL. Enlarged left ovary with heterogenous solid mass that replaces the left ovary. Previously the left ovary measured 8.3 x 7.7 x 8.5 cm on MRI. Pulsed Doppler evaluation demonstrates normal arterial and venous waveforms in both or adnexa, though note that both ovaries are essentially replaced by mass lesions. Other: No significant ascites. Foley catheter within the bladder which appears somewhat thick walled. IMPRESSION: 1. Very limited exam secondary to habitus and only trans abdominal technique 2. Bilateral solid adnexal masses essentially replacing the ovaries which thereby limits assessment of the ovaries. Left adnexal solid mass lesion has significantly increased in size; documented flow in the adnexa relates to flow within the solid adnexal masses. Gynecology follow-up recommended. Electronically Signed   By: Donavan Foil M.D.   On: 12/16/2020 19:23   DG CHEST PORT 1 VIEW  Result Date: 12/31/2020 CLINICAL DATA:  Chest pain EXAM: PORTABLE CHEST 1 VIEW COMPARISON:  Previous studies including the examination of 12/30/2020 FINDINGS: Transverse diameter of heart is increased. There are no signs of pulmonary edema or focal pulmonary consolidation. There are small linear densities in both mid lung fields and left lower lung fields suggesting subsegmental atelectasis with interval improvement in the left lung and possible slight worsening in the right mid lung fields. IMPRESSION: Cardiomegaly. There are no signs of pulmonary edema or focal pulmonary consolidation. There are small linear densities in the both mid and left lower lung fields suggesting subsegmental atelectasis with some improvement in the left lung. Electronically Signed   By: Elmer Picker M.D.   On: 12/31/2020 13:39   DG CHEST PORT 1 VIEW  Result Date: 12/30/2020 CLINICAL DATA:  Fever, abdominal pain EXAM: PORTABLE CHEST 1 VIEW COMPARISON:  Previous studies including the CT chest  done on 12/17/2020 FINDINGS: Transverse diameter of heart is increased. There are linear densities in both parahilar regions and medial left lower lung fields suggesting scarring or subsegmental atelectasis with interval worsening in the left lower lung fields. There is no significant pleural effusion or pneumothorax. Surgical clips are seen in the right thyroid bed. IMPRESSION: Cardiomegaly. There are linear densities in both parahilar regions and left lower lung fields with interval worsening suggesting subsegmental atelectasis. There are no signs of alveolar pulmonary edema or focal pulmonary consolidation. Electronically Signed   By: Royston Cowper  Rathinasamy M.D.   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 EXAM: IMPLANTED PORT A CATH PLACEMENT WITH ULTRASOUND AND FLUOROSCOPIC GUIDANCE COMPARISON:  None. MEDICATIONS: None. ANESTHESIA/SEDATION: Moderate (conscious) sedation was employed during this procedure. A total of Versed 3 mg and Fentanyl 100 mcg was administered intravenously. Moderate Sedation Time: 19 minutes. The patient's level of consciousness and vital signs were monitored continuously by radiology nursing throughout the procedure under my direct supervision. CONTRAST:  None FLUOROSCOPY TIME:  0 minutes, 12 seconds (5 mGy) COMPLICATIONS: None immediate. PROCEDURE: The procedure, risks, benefits, and alternatives were explained to the patient. Questions regarding the procedure were encouraged and answered. The patient understands and consents to the procedure. The right neck and chest were prepped with chlorhexidine in a sterile fashion, and a sterile drape was applied covering the operative field. Maximum barrier sterile technique with sterile gowns and gloves were used for the procedure. A timeout was performed  prior to the initiation of the procedure. Ultrasound was used to examine the jugular vein which was compressible and free of internal echoes. A skin marker was used to demarcate the planned venotomy and port pocket incision sites. Local anesthesia was provided to these sites and the subcutaneous tunnel track with 1% lidocaine with 1:100,000 epinephrine. A small incision was created at the jugular access site and blunt dissection was performed of the subcutaneous tissues. Under ultrasound guidance, the jugular vein was accessed with a 21 ga micropuncture needle and an 0.018" wire was inserted to the superior vena cava. Real-time ultrasound guidance was utilized for vascular access including the acquisition of a permanent ultrasound image documenting patency of the accessed vessel. A 5 Fr micopuncture set was then used, through which a 0.035" Rosen wire was passed under fluoroscopic guidance into the inferior vena cava. An 8 Fr dilator was then placed over the wire. A subcutaneous port pocket was then created along the upper chest wall utilizing a combination of sharp and blunt dissection. The pocket was irrigated with sterile saline, packed with gauze, and observed for hemorrhage. A single lumen "ISP" sized power injectable port was chosen for placement. The 8 Fr catheter was tunneled from the port pocket site to the venotomy incision. The port was placed in the pocket. The external catheter was trimmed to appropriate length. The dilator was exchanged for an 8 Fr peel-away sheath under fluoroscopic guidance. The catheter was then placed through the sheath and the sheath was removed. Final catheter positioning was confirmed and documented with a fluoroscopic spot radiograph. The port was accessed with a Huber needle, aspirated, and flushed with heparinized saline. The deep dermal layer of the port pocket incision was closed with interrupted 3-0 Vicryl suture. The skin was opposed with a running subcuticular 4-0  Monocryl suture. Dermabond was then placed over the port pocket and neck incisions. The patient tolerated the procedure well without immediate post procedural complication. FINDINGS: After catheter placement, the tip lies within the superior cavoatrial junction. The catheter aspirates and flushes normally and is ready for immediate use. IMPRESSION: Successful placement of a power injectable Port-A-Cath via the right internal jugular vein. The catheter is ready for immediate use. Ruthann Cancer, MD [facility] Radiology Electronically Signed   By: Ruthann Cancer M.D.   On: 12/26/2020 11:08   VAS [place] LOWER EXTREMITY VENOUS (DVT)  Result Date: 12/29/2020  Lower Venous DVT Study Patient Name:  NKENGE SONNTAG Madrazo  Date of Exam:   12/29/2020 Medical Rec #: [id number]            Accession #:    [id number] Date of Birth: [birth year]            Patient Gender: F Patient Age:   47 years Exam Location:  [facility] Procedure:      VAS [place] LOWER EXTREMITY VENOUS (DVT) Referring Phys: A POWELL JR --------------------------------------------------------------------------------  Indications: Pain.  Risk Factors: Cancer. Limitations: Body habitus and poor ultrasound/tissue interface. Comparison Study: No prior studies. Performing Technologist: Oliver Hum RVT  Examination Guidelines: A complete evaluation includes B-mode imaging, spectral Doppler, color Doppler, and power Doppler as needed of all accessible portions of each vessel. Bilateral testing is considered an integral part of a complete examination. Limited examinations for reoccurring indications may be performed as noted. The reflux portion of the exam is performed with the patient in reverse Trendelenburg.  +---------+---------------+---------+-----------+----------+--------------+ RIGHT    CompressibilityPhasicitySpontaneityPropertiesThrombus Aging  +---------+---------------+---------+-----------+----------+--------------+ CFV      Full           Yes      Yes                                 +---------+---------------+---------+-----------+----------+--------------+ SFJ      Full                                                        +---------+---------------+---------+-----------+----------+--------------+ FV Prox  Full                                                        +---------+---------------+---------+-----------+----------+--------------+ FV Mid   Full                                                        +---------+---------------+---------+-----------+----------+--------------+ FV DistalFull                                                        +---------+---------------+---------+-----------+----------+--------------+ PFV      Full                                                        +---------+---------------+---------+-----------+----------+--------------+ POP      Full           Yes      Yes                                 +---------+---------------+---------+-----------+----------+--------------+ PTV  Full                                                        +---------+---------------+---------+-----------+----------+--------------+ PERO     Full                                                        +---------+---------------+---------+-----------+----------+--------------+   +---------+---------------+---------+-----------+----------+--------------+ LEFT     CompressibilityPhasicitySpontaneityPropertiesThrombus Aging +---------+---------------+---------+-----------+----------+--------------+ CFV      Full           Yes      Yes                                 +---------+---------------+---------+-----------+----------+--------------+ SFJ      Full                                                         +---------+---------------+---------+-----------+----------+--------------+ FV Prox  Full                                                        +---------+---------------+---------+-----------+----------+--------------+ FV Mid   Full                                                        +---------+---------------+---------+-----------+----------+--------------+ FV DistalFull                                                        +---------+---------------+---------+-----------+----------+--------------+ PFV      Full                                                        +---------+---------------+---------+-----------+----------+--------------+ POP      Full           Yes      Yes                                 +---------+---------------+---------+-----------+----------+--------------+ PTV      Full                                                        +---------+---------------+---------+-----------+----------+--------------+  PERO     Full                                                        +---------+---------------+---------+-----------+----------+--------------+     Summary: RIGHT: - There is no evidence of deep vein thrombosis in the lower extremity.  - No cystic structure found in the popliteal fossa.  LEFT: - There is no evidence of deep vein thrombosis in the lower extremity.  - No cystic structure found in the popliteal fossa.  *See table(s) above for measurements and observations. Electronically signed by Jamelle Haring on 12/29/2020 at 12:00:16 PM.    Final     Microbiology: No results found for this or any previous visit (from the past 240 hour(s)).   Labs: Basic Metabolic Panel: Recent Labs  Lab 01/08/21 0451 01/10/21 0449 01/11/21 0549 01/14/21 0650  NA 138 136 137 138  K 3.6 3.3* 4.0 3.5  CL 106 104 106 105  CO2 25 25 25 25   GLUCOSE 111* 113* 122* 119*  BUN 16 14 12 12   CREATININE 0.77 0.81 0.74 0.78  CALCIUM 9.2 9.2 9.6 8.9   MG 2.1  --  2.0  --   PHOS 3.4  --   --   --    Liver Function Tests: Recent Labs  Lab 01/10/21 0449  AST 17  ALT 15  ALKPHOS 476*  BILITOT 0.8  PROT 5.9*  ALBUMIN 2.3*   No results for input(s): LIPASE, AMYLASE in the last 168 hours. No results for input(s): AMMONIA in the last 168 hours. CBC: Recent Labs  Lab 01/08/21 0451 01/08/21 0830 01/09/21 0408 01/11/21 0630 01/14/21 0650  WBC 6.4  --  6.1 5.7 3.3*  NEUTROABS  --   --  3.6  --   --   HGB 6.8* 6.8* 8.7* 8.7* 8.6*  HCT 22.1* 22.1* 27.0* 27.6* 27.0*  MCV 96.5  --  93.1 93.9 94.4  PLT 186  --  185 244 227   Cardiac Enzymes: No results for input(s): CKTOTAL, CKMB, CKMBINDEX, TROPONINI in the last 168 hours. BNP: BNP (last 3 results) No results for input(s): BNP in the last 8760 hours.  ProBNP (last 3 results) No results for input(s): PROBNP in the last 8760 hours.  CBG: Recent Labs  Lab 01/13/21 1217 01/13/21 1734 01/13/21 2239 01/14/21 0729 01/14/21 1122  GLUCAP 139* 98 110* 125* 118*       Signed:  Kayleen Memos, MD [facility] 01/14/2021, 12:02 PM

## 2021-01-14 NOTE — Evaluation (Signed)
Occupational Therapy Evaluation Patient Details Name: Erin [name] MRN: [id number] DOB: 09/16/1965 Today's Date: 01/14/2021   History of Present Illness This 55 years old female with PMH significant for OSA on CPAP, HTN, DMII, morbid obesity who presented with worsening abdominal pain and left thigh pain.   She has been undergoing workup for recently discovered cervical/urethral masses on CT in Sept 2022.  Further imaging showed ongoing interval enlargement of the left adnexal area.  There was also associated retroperitoneal and pelvic adenopathy.  MRI pelvis also performed on 12/05/2020 showed poorly enhancing mass centered at the urethra, solid enhancing bilateral ovarian masses, poorly enhancing mass involving left uterine cervix, and significant adenopathy involving bilateral common iliac, bilateral external iliac, bilateral inguinal lymph nodes.  Also noted to have pelvic osseous lesions concerning for metastatic disease as well.  Patient underwent cystoscopy with ureteral and bladder biopsy on 12/17/2020 she also underwent cervical biopsies following this procedure.  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   Clinical Impression   Patient is a 55 year old female who was living at home alone prior level. Currently patients pain is limiting participation in ADLs. Patient was mod A for perineal area hygiene, min A for LB dressing bathing tasks, min guard for standing balance with pain impacting participation. Patient recommended to have 24/7 support in next level of care to promote participation in tasks and ensure safety. Patient would continue to benefit from skilled OT services at this time while admitted and after d/c to address noted deficits in order to improve overall safety and independence in ADLs.        Recommendations for follow up therapy are one component of a multi-disciplinary discharge planning process, led by the  attending physician.  Recommendations may be updated based on patient status, additional functional criteria and insurance authorization.   Follow Up Recommendations  Home health OT    Assistance Recommended at Discharge Frequent or constant Supervision/Assistance  Functional Status Assessment  Patient has had a recent decline in their functional status and demonstrates the ability to make significant improvements in function in a reasonable and predictable amount of time.  Equipment Recommendations  None recommended by OT    Recommendations for Other Services       Precautions / Restrictions Precautions Precautions: Fall Precaution Comments: METS pelvis and spine Restrictions Weight Bearing Restrictions: No      Mobility Bed Mobility Overal bed mobility: Needs Assistance Bed Mobility: Supine to Sit     Supine to sit: Min guard;HOB elevated Sit to supine: Min assist;HOB elevated        Transfers Overall transfer level: Needs assistance Equipment used: Rolling walker (2 wheels) Transfers: Sit to/from Stand Sit to Stand: Min guard           General transfer comment: min/guard for safety, wide BOS      Balance Overall balance assessment: No apparent balance deficits (not formally assessed)                                         ADL either performed or assessed with clinical judgement   ADL Overall ADL's : Needs assistance/impaired Eating/Feeding: Set up;Sitting Eating/Feeding Details (indicate cue type and reason): EOB Grooming: Wash/dry face;Oral care;Wash/dry hands;Sitting Grooming Details (indicate cue type and reason): EOB Upper Body Bathing: Set up;Sitting   Lower Body Bathing: Minimal assistance;Sit to/from stand;Sitting/lateral leans  Upper Body Dressing : Sitting;Set up   Lower Body Dressing: Sit to/from stand;Sitting/lateral leans;Moderate assistance   Toilet Transfer: Minimal assistance;Rolling walker (2  wheels);Ambulation;BSC/3in1   Toileting- Water quality scientist and Hygiene: Moderate assistance;Sit to/from stand Toileting - Clothing Manipulation Details (indicate cue type and reason): patient has difficulty with reaching perineal areas due to body habitus.             Vision Patient Visual Report: No change from baseline       Perception     Praxis      Pertinent Vitals/Pain Pain Assessment: Faces Faces Pain Scale: Hurts even more Pain Location: pelvis area/lower abdomen, lower back Pain Descriptors / Indicators: Discomfort;Aching;Grimacing Pain Intervention(s): Monitored during session;Patient requesting pain meds-RN notified     Hand Dominance Right   Extremity/Trunk Assessment Upper Extremity Assessment Upper Extremity Assessment: Overall WFL for tasks assessed   Lower Extremity Assessment Lower Extremity Assessment: Defer to PT evaluation   Cervical / Trunk Assessment Cervical / Trunk Assessment: Kyphotic   Communication Communication Communication: No difficulties   Cognition Arousal/Alertness: Awake/alert Behavior During Therapy: WFL for tasks assessed/performed Overall Cognitive Status: Within Functional Limits for tasks assessed                                 General Comments: patient is motivated to participate in tasks at     General Comments       Exercises     Shoulder Instructions      Home Living Family/patient expects to be discharged to:: Private residence Living Arrangements: Alone Available Help at Discharge: Friend(s);Available PRN/intermittently Type of Home: Apartment Home Access: Stairs to enter Entrance Stairs-Number of Steps: 4 no rails Entrance Stairs-Rails: None Home Layout: One level     Bathroom Shower/Tub: Teacher, early years/pre: Standard     Home Equipment: Other (comment)          Prior Functioning/Environment Prior Level of Function : Independent/Modified Independent                         OT Problem List: Decreased activity tolerance;Impaired balance (sitting and/or standing);Decreased safety awareness;Pain;Decreased knowledge of use of DME or AE      OT Treatment/Interventions: Self-care/ADL training;Therapeutic exercise;Neuromuscular education;Energy conservation;DME and/or AE instruction;Therapeutic activities;Balance training;Patient/family education    OT Goals(Current goals can be found in the care plan section) Acute Rehab OT Goals Patient Stated Goal: to get pain under control OT Goal Formulation: With patient Time For Goal Achievement: 01/28/21 Potential to Achieve Goals: Good  OT Frequency: Min 2X/week   Barriers to D/C:    patient lives at home alone       Co-evaluation              AM-PAC OT "6 Clicks" Daily Activity     Outcome Measure Help from another person eating meals?: None Help from another person taking care of personal grooming?: A Little Help from another person toileting, which includes using toliet, bedpan, or urinal?: A Little Help from another person bathing (including washing, rinsing, drying)?: A Lot Help from another person to put on and taking off regular upper body clothing?: A Little Help from another person to put on and taking off regular lower body clothing?: A Lot 6 Click Score: 17   End of Session Equipment Utilized During Treatment: Rolling walker (2 wheels) Nurse Communication: Mobility status;Patient requests pain meds  Activity  Tolerance: Patient tolerated treatment well;Patient limited by pain Patient left: in bed;with call bell/phone within reach  OT Visit Diagnosis: Unsteadiness on feet (R26.81);Muscle weakness (generalized) (M62.81)                Time: 3582-5189 OT Time Calculation (min): 33 min Charges:  OT General Charges $OT Visit: 1 Visit OT Evaluation $OT Eval Low Complexity: 1 Low OT Treatments $Self Care/Home Management : 8-22 mins  Jackelyn Poling OTR/L, MS Acute  Rehabilitation Department Office# [phone] Pager# [phone]   Marcellina Millin 01/14/2021, 12:20 PM

## 2021-01-15 ENCOUNTER — Other Ambulatory Visit (HOSPITAL_COMMUNITY): Payer: Self-pay

## 2021-01-15 ENCOUNTER — Other Ambulatory Visit: Payer: Self-pay | Admitting: *Deleted

## 2021-01-15 ENCOUNTER — Encounter: Payer: Self-pay | Admitting: *Deleted

## 2021-01-15 ENCOUNTER — Telehealth: Payer: Self-pay | Admitting: *Deleted

## 2021-01-15 ENCOUNTER — Encounter: Payer: Self-pay | Admitting: Oncology

## 2021-01-15 ENCOUNTER — Emergency Department (HOSPITAL_COMMUNITY)
Admission: EM | Admit: 2021-01-15 | Discharge: 2021-01-15 | Disposition: A | Discharge status: Home / Self Care | Payer: Self-pay | Attending: Emergency Medicine | Admitting: Emergency Medicine

## 2021-01-15 DIAGNOSIS — C799 Secondary malignant neoplasm of unspecified site: Secondary | ICD-10-CM

## 2021-01-15 DIAGNOSIS — R1032 Left lower quadrant pain: Secondary | ICD-10-CM | POA: Insufficient documentation

## 2021-01-15 DIAGNOSIS — G8929 Other chronic pain: Secondary | ICD-10-CM | POA: Insufficient documentation

## 2021-01-15 DIAGNOSIS — Z87891 Personal history of nicotine dependence: Secondary | ICD-10-CM | POA: Insufficient documentation

## 2021-01-15 DIAGNOSIS — R197 Diarrhea, unspecified: Secondary | ICD-10-CM | POA: Insufficient documentation

## 2021-01-15 DIAGNOSIS — Z79899 Other long term (current) drug therapy: Secondary | ICD-10-CM | POA: Insufficient documentation

## 2021-01-15 DIAGNOSIS — Z85028 Personal history of other malignant neoplasm of stomach: Secondary | ICD-10-CM | POA: Insufficient documentation

## 2021-01-15 DIAGNOSIS — E876 Hypokalemia: Secondary | ICD-10-CM | POA: Insufficient documentation

## 2021-01-15 DIAGNOSIS — I10 Essential (primary) hypertension: Secondary | ICD-10-CM | POA: Insufficient documentation

## 2021-01-15 DIAGNOSIS — R000 Tachycardia, unspecified: Secondary | ICD-10-CM | POA: Insufficient documentation

## 2021-01-15 DIAGNOSIS — Z7984 Long term (current) use of oral hypoglycemic drugs: Secondary | ICD-10-CM | POA: Insufficient documentation

## 2021-01-15 DIAGNOSIS — E1169 Type 2 diabetes mellitus with other specified complication: Secondary | ICD-10-CM | POA: Insufficient documentation

## 2021-01-15 LAB — COMPREHENSIVE METABOLIC PANEL
ALT: 14 U/L (ref 0–44)
AST: 27 U/L (ref 15–41)
Albumin: 3.1 g/dL — ABNORMAL LOW (ref 3.5–5.0)
Alkaline Phosphatase: 522 U/L — ABNORMAL HIGH (ref 38–126)
Anion gap: 12 (ref 5–15)
BUN: 9 mg/dL (ref 6–20)
CO2: 24 mmol/L (ref 22–32)
Calcium: 9.4 mg/dL (ref 8.9–10.3)
Chloride: 104 mmol/L (ref 98–111)
Creatinine, Ser: 0.66 mg/dL (ref 0.44–1.00)
GFR, Estimated: >60 mL/min (ref >60)
Glucose, Bld: 116 mg/dL — ABNORMAL HIGH (ref 70–99)
Potassium: 3.3 mmol/L — ABNORMAL LOW (ref 3.5–5.1)
Sodium: 140 mmol/L (ref 135–145)
Total Bilirubin: 1 mg/dL (ref 0.3–1.2)
Total Protein: 7.5 g/dL (ref 6.5–8.1)

## 2021-01-15 LAB — CBC WITH DIFFERENTIAL/PLATELET
Abs Immature Granulocytes: 0.04 10*3/uL (ref 0.00–0.07)
Basophils Absolute: 0 10*3/uL (ref 0.0–0.1)
Basophils Relative: 1 %
Eosinophils Absolute: 0 10*3/uL (ref 0.0–0.5)
Eosinophils Relative: 0 %
HCT: 31.6 % — ABNORMAL LOW (ref 36.0–46.0)
Hemoglobin: 10.1 g/dL — ABNORMAL LOW (ref 12.0–15.0)
Immature Granulocytes: 1 %
Lymphocytes Relative: 28 %
Lymphs Abs: 1.1 10*3/uL (ref 0.7–4.0)
MCH: 29.6 pg (ref 26.0–34.0)
MCHC: 32 g/dL (ref 30.0–36.0)
MCV: 92.7 fL (ref 80.0–100.0)
Monocytes Absolute: 0.2 10*3/uL (ref 0.1–1.0)
Monocytes Relative: 5 %
Neutro Abs: 2.4 10*3/uL (ref 1.7–7.7)
Neutrophils Relative %: 65 %
Platelets: 235 10*3/uL (ref 150–400)
RBC: 3.41 MIL/uL — ABNORMAL LOW (ref 3.87–5.11)
RDW: 15 % (ref 11.5–15.5)
WBC: 3.7 10*3/uL — ABNORMAL LOW (ref 4.0–10.5)
nRBC: 0 % (ref 0.0–0.2)

## 2021-01-15 LAB — LIPASE, BLOOD: Lipase: 35 U/L (ref 11–51)

## 2021-01-15 MED ORDER — LOPERAMIDE HCL 2 MG PO CAPS
4.0000 mg | ORAL_CAPSULE | Freq: Once | ORAL | Status: AC
  Administered 2021-01-15: 4 mg via ORAL
  Filled 2021-01-15: qty 2

## 2021-01-15 MED ORDER — SODIUM CHLORIDE 0.9 % IV BOLUS
1000.0000 mL | Freq: Once | INTRAVENOUS | Status: AC
  Administered 2021-01-15: 1000 mL via INTRAVENOUS

## 2021-01-15 MED ORDER — POTASSIUM CHLORIDE CRYS ER 20 MEQ PO TBCR
40.0000 meq | EXTENDED_RELEASE_TABLET | Freq: Once | ORAL | Status: AC
  Administered 2021-01-15: 40 meq via ORAL
  Filled 2021-01-15: qty 2

## 2021-01-15 MED ORDER — LOPERAMIDE HCL 2 MG PO CAPS
2.0000 mg | ORAL_CAPSULE | Freq: Four times a day (QID) | ORAL | 0 refills | Status: AC | PRN
Start: 2021-01-15 — End: ?
  Filled 2021-01-15: qty 12, 3d supply, fill #0

## 2021-01-15 MED ORDER — OXYCODONE HCL 5 MG PO TABS
5.0000 mg | ORAL_TABLET | Freq: Once | ORAL | Status: AC
  Administered 2021-01-15: 5 mg via ORAL
  Filled 2021-01-15: qty 1

## 2021-01-15 MED ORDER — HYDROMORPHONE HCL 1 MG/ML IJ SOLN
1.0000 mg | Freq: Once | INTRAMUSCULAR | Status: AC
  Administered 2021-01-15: 1 mg via INTRAVENOUS
  Filled 2021-01-15: qty 1

## 2021-01-15 NOTE — Progress Notes (Signed)
PATIENT NAVIGATOR PROGRESS NOTE  Name: BRENNEN GARDINER Date: 01/15/2021 MRN: [id number]  DOB: 1965-03-21   Reason for visit:  Phone call from patient  Comments:  Patient D/C from hospital last night, niece from [place] transported her home but had to return to [facility] and was unable to stay with patient. Ms. Ruffino very tearfully states that she is unable to care for herself, ambulating with walker minimally, unable to wipe herself, meal prep, ADLs, she doesn't have her meds from D/C and is waiting for niece to come from [facility] for prescription pick up.   Only family is niece in [place] and Brent in [place].  Although Home Health was set up, they will not be there until Saturday for initial visit. Patient states that she needs to be placed in SNF or rehab.  Have contacted SW for consult as to next steps.       Time spent counseling/coordinating care: 45-60 minutes

## 2021-01-15 NOTE — Discharge Instructions (Addendum)
If you develop worsening, continued, or recurrent abdominal pain, uncontrolled vomiting, blood in your stools, fever, chest or back pain, or any other new/concerning symptoms then return to the ER for evaluation.

## 2021-01-15 NOTE — Progress Notes (Signed)
.  Transition of Care [facility]) - Emergency Department Mini Assessment   Patient Details  Name: Erin [name] MRN: [id number] Date of Birth: Apr 30, 1965  Transition of Care [facility]) CM/SW Contact:    Illene Regulus, LCSW Phone Number: 01/15/2021, 3:27 PM   Clinical Narrative:  TOC CSW met with pt at bedside, pt stated she was told to come to [facility] ED by a staff member from [facility] due to pt being in pain and feeling she needs to be admitted.  Pt stated, " I was discharged wrong, I do not have anyone to help me at home." Pt has been set up with [facility] PT, and aid. Pt was set up with a rolling walker and 3-in-1 as well. CSW informed pt about her barriers to placement. Pt stated she was unable to get pain med's from her pharmacy yesterday. CSW contacted [facility] pharmacy, pt's prescriptions are still waiting, pt stated she does not need assistance with getting her medications. Pt stated her nice will pick her up upon d/c.   ED Mini Assessment: What brought you to the Emergency Department? : diarrhea  Barriers to Discharge: No Barriers Identified        Interventions which prevented an admission or readmission: SNF Placement, Medication Review, Home Health Consult or Services    Patient Contact and Communications        ,                 Admission diagnosis:  Ca Pt, Diarrhea, abd pain Patient Active Problem List   Diagnosis Date Noted   Anemia 12/31/2020   Fever 12/30/2020   Panic attack 12/29/2020   Dysuria 12/28/2020   Gastric cancer ([place]) 12/27/2020   Cancer related pain 12/27/2020   Gastritis and gastroduodenitis    Adenocarcinoma ([place]) 12/20/2020   Metastatic adenocarcinoma of unknown origin ([place])    Malnutrition of moderate degree 12/18/2020   Elevated CEA 12/18/2020   Mass of urethra    Abdominal pain 12/16/2020   Ovarian mass 12/16/2020   Hypokalemia 12/16/2020   Tear of right rotator cuff 04/04/2020   Vitamin D deficiency 04/02/2020   Other fatigue  03/13/2020   Shortness of breath on exertion 03/13/2020   OSA (obstructive sleep apnea) 11/25/2017   Obesity hypoventilation syndrome ([place]) 11/25/2017   Mallet finger of right hand 09/03/2017   Anterolisthesis 04/17/2017   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[place]) 04/17/2017   Degenerative disc disease, lumbar 04/17/2017   Class 3 severe obesity with serious comorbidity and body mass index (BMI) of 50.0 to 59.9 in adult [facility]) 06/01/2016   Diabetes mellitus ([place]) 05/27/2016   Essential hypertension 04/13/2016   Menorrhagia 08/26/2015    Class: Present on Admission   PCP:  Pcp, No Pharmacy:   [facility] [address] [place] [address] Phone: [phone] Fax: [phone]

## 2021-01-15 NOTE — ED Triage Notes (Signed)
Pt BIB [facility] from home. D/c from [facility] oncology floor yesterday without pain medication and was unable to acquire. EMS reports the cancer center nurse stated the amount she was given would have been insufficient for today. Endorses 10/10 LLQ pain. Reports diarrhea but that she was receiving motility meds while in hospital due to pain meds. Hx HTN, has not taken BP meds today  BP 200/104 HR 106

## 2021-01-15 NOTE — Progress Notes (Signed)
Urgent referral placed for SW after D/C from hospital 12/6

## 2021-01-15 NOTE — Telephone Encounter (Signed)
Patient called in tears expressing distress over being discharged to her home yesterday. Says "I can't do anything for myself". Home health is not able to see her until Saturday. She can ambulate in home with walker, but too weak for meal prep or ADLs. Having diarrhea and has no Imodium in house. Only family lives in [place] and [place] and trying to get someone to come up to go to pharmacy for her to pick up the #9 oxycodone tablets that were sent in for her. She said she is not able to stay w/any family due to their busy lives and they can't stay with her. She feels she needs to be in rehab or SNF.

## 2021-01-15 NOTE — ED Provider Notes (Signed)
[facility] DEPT Provider Note   CSN: [id number] Arrival date & time: 01/15/21  1144     History Chief Complaint  Patient presents with   Abdominal Pain    Erin [name] is a 55 y.o. female.  HPI 55 year old female presents with abdominal pain and diarrhea.  She was discharged from the hospital yesterday after being in the hospital for almost a month.  She states she was unable to pick up her pain medicines by the time she got to the pharmacy.  She states has been having pain in her left lower quadrant for about 2 months and is worse today because she has not had pain medicine but it is otherwise similar to what it was. No vomiting but starting last night she developed about 5-6 episodes of diarrhea. No gross blood. She called the on call nurse who told her to come back to the ED. She wants to be re-admitted.  She states she has no help at home and feels like she needs 24-hour help.  Past Medical History:  Diagnosis Date   Anxiety    Constipation    Depression    GERD (gastroesophageal reflux disease)    Pt states she no longer needs meds   Hyperlipidemia    Hypertension    Menorrhagia    OSA (obstructive sleep apnea) 11/25/2017   OSA on CPAP    Other fatigue    Shortness of breath on exertion    Type 2 diabetes mellitus ([place])    Vitamin D deficiency    Wears glasses     Patient Active Problem List   Diagnosis Date Noted   Anemia 12/31/2020   Fever 12/30/2020   Panic attack 12/29/2020   Dysuria 12/28/2020   Gastric cancer ([place]) 12/27/2020   Cancer related pain 12/27/2020   Gastritis and gastroduodenitis    Adenocarcinoma ([place]) 12/20/2020   Metastatic adenocarcinoma of unknown origin ([place])    Malnutrition of moderate degree 12/18/2020   Elevated CEA 12/18/2020   Mass of urethra    Abdominal pain 12/16/2020   Ovarian mass 12/16/2020   Hypokalemia 12/16/2020   Tear of right rotator cuff 04/04/2020   Vitamin D deficiency  04/02/2020   Other fatigue 03/13/2020   Shortness of breath on exertion 03/13/2020   OSA (obstructive sleep apnea) 11/25/2017   Obesity hypoventilation syndrome ([place]) 11/25/2017   Mallet finger of right hand 09/03/2017   Anterolisthesis 04/17/2017   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[place]) 04/17/2017   Degenerative disc disease, lumbar 04/17/2017   Class 3 severe obesity with serious comorbidity and body mass index (BMI) of 50.0 to 59.9 in adult [facility]) 06/01/2016   Diabetes mellitus ([place]) 05/27/2016   Essential hypertension 04/13/2016   Menorrhagia 08/26/2015    Class: Present on Admission    Past Surgical History:  Procedure Laterality Date   BIOPSY  12/22/2020   Procedure: BIOPSY;  Surgeon: Lavena Bullion, DO;  [facility];  Service: Endoscopy;;   BREAST EXCISIONAL BIOPSY     COLONOSCOPY WITH PROPOFOL N/A 12/22/2020   Procedure: COLONOSCOPY WITH PROPOFOL;  Surgeon: Lavena Bullion, DO;  [facility];  Service: Endoscopy;  Laterality: N/A;   CYSTOSCOPY N/A 12/17/2020   Procedure: CYSTOSCOPY with biopsies;  Surgeon: Robley Fries, MD;  Location[facility];  Service: Urology;  Laterality: N/A;   DILITATION & CURRETTAGE/HYSTROSCOPY WITH NOVASURE ABLATION N/A 08/26/2015   Procedure: DILATATION & CURETTAGE/HYSTEROSCOPY WITH NOVASURE ABLATION;  Surgeon: [name], MD;  Location: [facility];  Service: Gynecology;  Laterality: N/A;   ESOPHAGOGASTRODUODENOSCOPY (EGD) WITH PROPOFOL N/A 12/22/2020   Procedure: ESOPHAGOGASTRODUODENOSCOPY (EGD) WITH PROPOFOL;  Surgeon: Lavena Bullion, DO;  [facility];  Service: Endoscopy;  Laterality: N/A;   EXCISIONAL LEFT BREAST BX  11-14-2007   benign   IR IMAGING GUIDED PORT INSERTION  12/26/2020   LAPAROSCOPIC TUBAL LIGATION Bilateral 08/26/2015   Procedure: LAPAROSCOPIC TUBAL LIGATION;  Surgeon: [name], MD;  Location: [facility];  Service: Gynecology;  Laterality: Bilateral;   POLYPECTOMY   12/22/2020   Procedure: POLYPECTOMY;  Surgeon: Lavena Bullion, DO;  [facility];  Service: Endoscopy;;   SHOULDER ARTHROSCOPY WITH ROTATOR CUFF REPAIR AND SUBACROMIAL DECOMPRESSION Right 04/08/2020   Procedure: SHOULDER ARTHROSCOPY WITH ROTATOR CUFF REPAIR AND SUBACROMIAL DECOMPRESSION;  Surgeon: Tania Ade, MD;  [facility];  Service: Orthopedics;  Laterality: Right;  NEED 90 MINUTES FOR THIS CASE   THYROID LOBECTOMY Right 04-30-2008     OB History     Gravida  0   Para  0   Term  0   Preterm  0   AB  0   Living  0      SAB  0   IAB  0   Ectopic  0   Multiple  0   Live Births  0           Family History  Problem Relation Age of Onset   Stroke Mother    Diabetes Mother    Hypertension Mother    Obesity Mother    Glaucoma Father    Alcoholism Father    Breast cancer Maternal Aunt    Renal cancer Maternal Uncle    Prostate cancer Maternal Grandfather    Heart disease Paternal Grandmother        enlarged heart   Diabetes Half-Sister    Hypertension Half-Sister    Colon cancer Neg Hx    Esophageal cancer Neg Hx    Stomach cancer Neg Hx    Colon polyps Neg Hx    Rectal cancer Neg Hx    Ovarian cancer Neg Hx    Endometrial cancer Neg Hx    Pancreatic cancer Neg Hx     Social History   Tobacco Use   Smoking status: Former    Years: 15.00    Types: Cigarettes    Quit date: 2001    Years since quitting: 21.9   Smokeless tobacco: Never  Vaping Use   Vaping Use: Never used  Substance Use Topics   Alcohol use: No   Drug use: Yes    Types: Marijuana    Home Medications Prior to Admission medications   Medication Sig Start Date End Date Taking? Authorizing Provider  loperamide (IMODIUM) 2 MG capsule Take 1 capsule (2 mg total) by mouth 4 (four) times daily as needed for diarrhea or loose stools. 01/15/21  Yes Sherwood Gambler, MD  amLODipine (NORVASC) 5 MG tablet TAKE 1 TABLET (5 MG TOTAL) BY MOUTH DAILY. Patient taking  differently: Take 5 mg by mouth daily. 03/06/20 03/06/21  Fay Records, MD  carvedilol (COREG) 6.25 MG tablet TAKE 1 TABLET BY MOUTH 2 TIMES DAILY WITH A MEAL. Patient taking differently: Take 6.25 mg by mouth 2 (two) times daily with a meal. 03/06/20 03/06/21  Fay Records, MD  cyclobenzaprine (FLEXERIL) 5 MG tablet Take 1 tablet (5 mg total) by mouth 3 (three) times daily as needed for up to 3 days for muscle spasms. 01/14/21  01/17/21  Kayleen Memos, DO  diclofenac Sodium (VOLTAREN) 1 % GEL Apply 2 g topically 4 (four) times daily as needed (msk pain). 01/14/21   Kayleen Memos, DO  gabapentin (NEURONTIN) 300 MG capsule Take 1 capsule (300 mg total) by mouth at bedtime. 01/14/21 02/13/21  Kayleen Memos, DO  liver oil-zinc oxide (DESITIN) 40 % ointment Apply topically as needed for irritation. 01/14/21   Kayleen Memos, DO  metFORMIN (GLUCOPHAGE) 500 MG tablet Take 1 tablet (500 mg total) by mouth daily. Patient taking differently: Take 500 mg by mouth daily. 07/10/20     oxyCODONE (OXY IR/ROXICODONE) 5 MG immediate release tablet Take 1 tablet (5 mg total) by mouth every 8 (eight) hours as needed for up to 3 days for severe pain. 01/14/21 01/18/21  Kayleen Memos, DO  pantoprazole (PROTONIX) 40 MG tablet Take 1 tablet (40 mg total) by mouth daily. 01/14/21 04/14/21  Kayleen Memos, DO  polyethylene glycol (MIRALAX / GLYCOLAX) 17 g packet Take 17 g by mouth daily as needed. 01/14/21   Kayleen Memos, DO  senna-docusate (SENOKOT-S) 8.6-50 MG tablet Take 2 tablets by mouth at bedtime. 01/14/21 02/13/21  Kayleen Memos, DO    Allergies    Tramadol  Review of Systems   Review of Systems  Constitutional:  Negative for fever.  Respiratory:  Negative for shortness of breath.   Cardiovascular:  Negative for chest pain.  Gastrointestinal:  Positive for abdominal pain and diarrhea. Negative for blood in stool and vomiting.  All other systems reviewed and are negative.  Physical Exam Updated Vital Signs BP (!)  179/99 (BP Location: Right Arm)   Pulse (!) 104   Temp 98.2 F (36.8 C) (Oral)   Resp 18   SpO2 97%   Physical Exam Vitals and nursing note reviewed.  Constitutional:      Appearance: She is well-developed. She is obese.  HENT:     Head: Normocephalic and atraumatic.     Right Ear: External ear normal.     Left Ear: External ear normal.     Nose: Nose normal.  Eyes:     General:        Right eye: No discharge.        Left eye: No discharge.  Cardiovascular:     Rate and Rhythm: Regular rhythm. Tachycardia present.     Heart sounds: Normal heart sounds.  Pulmonary:     Effort: Pulmonary effort is normal.     Breath sounds: Normal breath sounds.  Abdominal:     Palpations: Abdomen is soft.     Tenderness: There is abdominal tenderness in the left lower quadrant.  Skin:    General: Skin is warm and dry.  Neurological:     Mental Status: She is alert.  Psychiatric:        Mood and Affect: Mood is not anxious.    ED Results / Procedures / Treatments   Labs (all labs ordered are listed, but only abnormal results are displayed) Labs Reviewed  COMPREHENSIVE METABOLIC PANEL - Abnormal; Notable for the following components:      Result Value   Potassium 3.3 (*)    Glucose, Bld 116 (*)    Albumin 3.1 (*)    Alkaline Phosphatase 522 (*)    All other components within normal limits  CBC WITH DIFFERENTIAL/PLATELET - Abnormal; Notable for the following components:   WBC 3.7 (*)    RBC 3.41 (*)    Hemoglobin  10.1 (*)    HCT 31.6 (*)    All other components within normal limits  LIPASE, BLOOD    EKG None  Radiology No results found.  Procedures Procedures   Medications Ordered in ED Medications  HYDROmorphone (DILAUDID) injection 1 mg (1 mg Intravenous Given 01/15/21 1532)  sodium chloride 0.9 % bolus 1,000 mL (1,000 mLs Intravenous New Bag/Given 01/15/21 1636)  loperamide (IMODIUM) capsule 4 mg (4 mg Oral Given 01/15/21 1533)  potassium chloride SA (KLOR-CON M) CR  tablet 40 mEq (40 mEq Oral Given 01/15/21 1638)  oxyCODONE (Oxy IR/ROXICODONE) immediate release tablet 5 mg (5 mg Oral Given 01/15/21 1638)    ED Course  I have reviewed the triage vital signs and the nursing notes.  Pertinent labs & imaging results that were available during my care of the patient were reviewed by me and considered in my medical decision making (see chart for details).    MDM Rules/Calculators/A&P                           Patient's abdominal pain is not really any different than the abdominal pain she has been dealing with for weeks to months.  Otherwise she has had multiple episodes of diarrhea, which could be a combination of not having had her oxycodone versus the bowel regimen she was on in the hospital.  We will give some Imodium and she has been given potassium.  Case manager/social work team was involved as patient feels like she needs help at home.  This is actually coming in a couple days.  Her niece is able to pick her up.  She appears stable for discharge.  No indication for readmission. Final Clinical Impression(s) / ED Diagnoses Final diagnoses:  Diarrhea, unspecified type  Hypokalemia  Chronic abdominal pain    Rx / DC Orders ED Discharge Orders          Ordered    loperamide (IMODIUM) 2 MG capsule  4 times daily PRN        01/15/21 1638             Sherwood Gambler, MD 01/15/21 1650

## 2021-01-16 ENCOUNTER — Encounter: Payer: Self-pay | Admitting: General Practice

## 2021-01-16 ENCOUNTER — Other Ambulatory Visit (HOSPITAL_COMMUNITY): Payer: Self-pay

## 2021-01-16 ENCOUNTER — Encounter: Payer: Self-pay | Admitting: *Deleted

## 2021-01-16 ENCOUNTER — Other Ambulatory Visit: Payer: Self-pay | Admitting: Oncology

## 2021-01-16 DIAGNOSIS — R1030 Lower abdominal pain, unspecified: Secondary | ICD-10-CM

## 2021-01-16 MED ORDER — OXYCODONE HCL 5 MG PO TABS
5.0000 mg | ORAL_TABLET | ORAL | 0 refills | Status: DC | PRN
  Filled 2021-01-16: qty 60, 5d supply, fill #0

## 2021-01-16 MED ORDER — OXYCODONE HCL 5 MG PO TABS
5.0000 mg | ORAL_TABLET | ORAL | 0 refills | Status: DC | PRN
  Filled 2021-01-16 (×2): qty 60, 5d supply, fill #0

## 2021-01-16 NOTE — Progress Notes (Signed)
[place] Work  Initial Assessment   Erin [name] is a 55 y.o. year old female contacted by phone. Clinical Social Work was referred by medical oncology/nurse navigator for assessment of psychosocial needs.   SDOH (Social Determinants of Health) assessments performed: Yes   Distress Screen completed: No No flowsheet data found.  Family/Social Information:  Housing Arrangement: patient lives alone no viable support in the community; worried about how she will be able to pay bills/pay rent, concerned about self care as she lives alone and is physically debilitated Family members/support persons in your life? Family and very limited support, niece in [place], aunt in [place], no friends/church/community support able to be identified Transportation concerns: yes, [name] RN referred to [employer], patient states she can use regular car transport but needs walker to get to car Employment: Unemployed. Income source: Supported by Family and Friends and No income Financial concerns: Yes, current concerns Type of concern:  she is working on getting information from her 32 [address].  She states she does not have a car at this point, prior car was repossessed.   Both of these issues need to be addressed in order for her Medicaid application to move forward.  Her assigned [employer] worker is [name] [phone]).  Referral to [facility] was sent on 12/18/20 by [name]/[facility].  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  Was working second job but now cannot work at that job.   Has no short or long term disability benefits.   Worried about how to pay basic living expenses has she has no income, no job, no disability Food access concerns: no, niece comes and shops for her as needed Religious or spiritual practice: did not assess Medication Concerns: yes, she is uninsured, has difficulty paying  for medications.  In addition, she has difficulty getting out of her house to the pharmacy.  Was advised to call the pharmacy to discuss possibility of medication delivery.    Services Currently in place:  [facility] PT and aide w [facility] (first visit scheduled for 01/18/21), has walker and bedside commode, does not have a wheelchair  Coping/ Adjustment to diagnosis: Patient understands treatment plan and what happens next? yes, diagnosed with metastatic gastric cancer, recent lengthy hospitalization for this condition.  Will begin outpatient treatment in next few weeks.  Currently at home, weak and debilitated, struggling to care for herself independently.   Concerns about diagnosis and/or treatment: How I will pay for the services I need and how will I care for myself as I live alone and am very weak Patient reported stressors: Housing, Insurance underwriter, Work/ school, Publishing rights manager, Transport planner, Isolation/ feeling alone, and Physical issues Hopes and priorities: would like to be in assisted living or SNF, does not have payor source Current coping skills/ strengths: Other: motivated to return to work when able to do so    SUMMARY: Current SDOH Barriers:  Financial constraints related to no income, Limited social support, Transportation, Level of care concerns, Medication procurement, Social Isolation, and Inability to perform ADL's independently  Interventions: Discussed common feeling and emotions when being diagnosed with cancer, and the importance of support during treatment Informed patient of the support team roles and support services at [facility] Provided CSW contact information and encouraged patient to call with any questions or concerns Referred patient to [employer] and [employer] - referrals already made, CSW asked for status  updates from both   Follow Up Plan:  will coordinate patient needs w [facility] nurse navigator Patient verbalizes understanding of plan: Yes    [name] ,  [place], LCSW Clinical Social Worker Phone:  [phone]

## 2021-01-16 NOTE — Progress Notes (Signed)
[facility] [place] CSW Progress Notes  Call to patient per urgent referral.  Unable to reach, left VM w my contact information and encouragement to call back at her convenience.  Edwyna Shell, LCSW Clinical Social Worker Phone:  [phone]

## 2021-01-16 NOTE — Progress Notes (Signed)
Coordinating care for Ms Kniskern after hospitalization and subsequent ER visit. Niece is providing care as she is able since she lives in [place] and cannot stay with patient. Patient was able to pickup discharge pain medicine from [facility].  Transportation referral placed on 12/6 and patient given contact information to set up rides. Patient reports taking 3 pain pills a day and will require refills prior to 12/19 appointment here at [facility]. Will discuss with Dr Benay Spice She states she is able to ambulate with walker and rested today. We reviewed upcoming appts. Encouraged her to call with any issues or questions

## 2021-01-17 ENCOUNTER — Encounter: Payer: Self-pay | Admitting: Oncology

## 2021-01-18 ENCOUNTER — Other Ambulatory Visit: Payer: Self-pay | Admitting: Oncology

## 2021-01-20 ENCOUNTER — Encounter: Payer: Self-pay | Admitting: *Deleted

## 2021-01-20 NOTE — Progress Notes (Signed)
Received request from [facility] care for MD to sign her physical therapy orders. Signed and faxed to [phone]

## 2021-01-22 ENCOUNTER — Encounter: Payer: Self-pay | Admitting: *Deleted

## 2021-01-23 ENCOUNTER — Inpatient Hospital Stay: Payer: Self-pay | Attending: Gynecologic Oncology | Admitting: General Practice

## 2021-01-23 DIAGNOSIS — D413 Neoplasm of uncertain behavior of urethra: Secondary | ICD-10-CM | POA: Insufficient documentation

## 2021-01-23 DIAGNOSIS — D649 Anemia, unspecified: Secondary | ICD-10-CM | POA: Insufficient documentation

## 2021-01-23 DIAGNOSIS — Z79899 Other long term (current) drug therapy: Secondary | ICD-10-CM | POA: Insufficient documentation

## 2021-01-23 DIAGNOSIS — Z5111 Encounter for antineoplastic chemotherapy: Secondary | ICD-10-CM | POA: Insufficient documentation

## 2021-01-23 DIAGNOSIS — E46 Unspecified protein-calorie malnutrition: Secondary | ICD-10-CM | POA: Insufficient documentation

## 2021-01-23 DIAGNOSIS — K5900 Constipation, unspecified: Secondary | ICD-10-CM | POA: Insufficient documentation

## 2021-01-23 DIAGNOSIS — D72829 Elevated white blood cell count, unspecified: Secondary | ICD-10-CM | POA: Insufficient documentation

## 2021-01-23 DIAGNOSIS — R109 Unspecified abdominal pain: Secondary | ICD-10-CM | POA: Insufficient documentation

## 2021-01-23 DIAGNOSIS — D3911 Neoplasm of uncertain behavior of right ovary: Secondary | ICD-10-CM | POA: Insufficient documentation

## 2021-01-23 DIAGNOSIS — D3912 Neoplasm of uncertain behavior of left ovary: Secondary | ICD-10-CM | POA: Insufficient documentation

## 2021-01-23 DIAGNOSIS — C169 Malignant neoplasm of stomach, unspecified: Secondary | ICD-10-CM

## 2021-01-23 NOTE — Progress Notes (Signed)
[place] CSW Progress  Check in call to patient, "I am doing OK."  Feeling "a little bit better", tries to get up "some."  he is getting [facility] [facility] PT, has had two visits.  She is supposed to get an [place],  this has not been supplied yet.  Niece comes from [place] to help her get medications and food.  She is working on acquiring Medicaid, she is aware she needs to spend down her 79 [address], she is still awaiting the disbursement.  She is also aware she needs to get copies of her bank statements.  She has been in contact w her DSS caseworker and understands all the requirements.  She has not heard from [facility], provided their contact information and encouraged her to call and request help.  She has [facility] set up for upcoming appointment, confident this will meet her needs.  No further issues at this time, please reconsult if needed.  Edwyna Shell, LCSW Clinical Social Worker Phone:  [phone]

## 2021-01-26 ENCOUNTER — Other Ambulatory Visit: Payer: Self-pay | Admitting: Oncology

## 2021-01-27 ENCOUNTER — Inpatient Hospital Stay: Payer: Self-pay

## 2021-01-27 ENCOUNTER — Encounter: Payer: Self-pay | Admitting: Nurse Practitioner

## 2021-01-27 ENCOUNTER — Other Ambulatory Visit: Payer: Self-pay | Admitting: *Deleted

## 2021-01-27 ENCOUNTER — Encounter: Payer: Self-pay | Admitting: Oncology

## 2021-01-27 ENCOUNTER — Inpatient Hospital Stay (HOSPITAL_BASED_OUTPATIENT_CLINIC_OR_DEPARTMENT_OTHER): Payer: Self-pay | Admitting: Nurse Practitioner

## 2021-01-27 ENCOUNTER — Encounter: Payer: Self-pay | Admitting: *Deleted

## 2021-01-27 ENCOUNTER — Other Ambulatory Visit: Payer: Self-pay

## 2021-01-27 ENCOUNTER — Other Ambulatory Visit (HOSPITAL_COMMUNITY): Payer: Self-pay

## 2021-01-27 VITALS — BP 115/79 | HR 103 | Resp 18

## 2021-01-27 VITALS — BP 113/79 | HR 130 | Temp 98.7°F | Resp 18 | Ht 64.0 in | Wt 240.0 lb

## 2021-01-27 DIAGNOSIS — C799 Secondary malignant neoplasm of unspecified site: Secondary | ICD-10-CM

## 2021-01-27 DIAGNOSIS — C169 Malignant neoplasm of stomach, unspecified: Secondary | ICD-10-CM

## 2021-01-27 DIAGNOSIS — R1030 Lower abdominal pain, unspecified: Secondary | ICD-10-CM

## 2021-01-27 LAB — CBC WITH DIFFERENTIAL (CANCER CENTER ONLY)
Abs Immature Granulocytes: 0.09 10*3/uL — ABNORMAL HIGH (ref 0.00–0.07)
Basophils Absolute: 0 10*3/uL (ref 0.0–0.1)
Basophils Relative: 0 %
Eosinophils Absolute: 0 10*3/uL (ref 0.0–0.5)
Eosinophils Relative: 0 %
HCT: 29.1 % — ABNORMAL LOW (ref 36.0–46.0)
Hemoglobin: 9.1 g/dL — ABNORMAL LOW (ref 12.0–15.0)
Immature Granulocytes: 2 %
Lymphocytes Relative: 18 %
Lymphs Abs: 1 10*3/uL (ref 0.7–4.0)
MCH: 28.7 pg (ref 26.0–34.0)
MCHC: 31.3 g/dL (ref 30.0–36.0)
MCV: 91.8 fL (ref 80.0–100.0)
Monocytes Absolute: 0.8 10*3/uL (ref 0.1–1.0)
Monocytes Relative: 14 %
Neutro Abs: 3.6 10*3/uL (ref 1.7–7.7)
Neutrophils Relative %: 66 %
Platelet Count: 185 10*3/uL (ref 150–400)
RBC: 3.17 MIL/uL — ABNORMAL LOW (ref 3.87–5.11)
RDW: 16.8 % — ABNORMAL HIGH (ref 11.5–15.5)
WBC Count: 5.5 10*3/uL (ref 4.0–10.5)
nRBC: 0.7 % — ABNORMAL HIGH (ref 0.0–0.2)

## 2021-01-27 LAB — CMP (CANCER CENTER ONLY)
ALT: 9 U/L (ref 0–44)
AST: 26 U/L (ref 15–41)
Albumin: 3.4 g/dL — ABNORMAL LOW (ref 3.5–5.0)
Alkaline Phosphatase: 437 U/L — ABNORMAL HIGH (ref 38–126)
Anion gap: 12 (ref 5–15)
BUN: 27 mg/dL — ABNORMAL HIGH (ref 6–20)
CO2: 26 mmol/L (ref 22–32)
Calcium: 9.5 mg/dL (ref 8.9–10.3)
Chloride: 99 mmol/L (ref 98–111)
Creatinine: 1.36 mg/dL — ABNORMAL HIGH (ref 0.44–1.00)
GFR, Estimated: 46 mL/min — ABNORMAL LOW (ref >60)
Glucose, Bld: 192 mg/dL — ABNORMAL HIGH (ref 70–99)
Potassium: 2.9 mmol/L — ABNORMAL LOW (ref 3.5–5.1)
Sodium: 137 mmol/L (ref 135–145)
Total Bilirubin: 1.1 mg/dL (ref 0.3–1.2)
Total Protein: 6.8 g/dL (ref 6.5–8.1)

## 2021-01-27 LAB — CEA (ACCESS): CEA (CHCC): 835.64 ng/mL — ABNORMAL HIGH (ref 0.00–5.00)

## 2021-01-27 MED ORDER — FLUOROURACIL CHEMO INJECTION 2.5 GM/50ML
300.0000 mg/m2 | Freq: Once | INTRAVENOUS | Status: AC
  Administered 2021-01-27: 15:00:00 650 mg via INTRAVENOUS
  Filled 2021-01-27: qty 13

## 2021-01-27 MED ORDER — SODIUM CHLORIDE 0.9 % IV SOLN
2000.0000 mg/m2 | INTRAVENOUS | Status: DC
  Administered 2021-01-27: 15:00:00 4450 mg via INTRAVENOUS
  Filled 2021-01-27: qty 89

## 2021-01-27 MED ORDER — OXALIPLATIN CHEMO INJECTION 100 MG/20ML
65.0000 mg/m2 | Freq: Once | INTRAVENOUS | Status: AC
  Administered 2021-01-27: 13:00:00 145 mg via INTRAVENOUS
  Filled 2021-01-27: qty 20

## 2021-01-27 MED ORDER — OXYCODONE HCL 5 MG PO TABS
5.0000 mg | ORAL_TABLET | ORAL | 0 refills | Status: DC | PRN
  Filled 2021-01-27: qty 60, 5d supply, fill #0

## 2021-01-27 MED ORDER — PALONOSETRON HCL INJECTION 0.25 MG/5ML
0.2500 mg | Freq: Once | INTRAVENOUS | Status: AC
  Administered 2021-01-27: 11:00:00 0.25 mg via INTRAVENOUS
  Filled 2021-01-27: qty 5

## 2021-01-27 MED ORDER — SODIUM CHLORIDE 0.9 % IV SOLN
10.0000 mg | Freq: Once | INTRAVENOUS | Status: AC
  Administered 2021-01-27: 11:00:00 10 mg via INTRAVENOUS
  Filled 2021-01-27: qty 1

## 2021-01-27 MED ORDER — CYCLOBENZAPRINE HCL 5 MG PO TABS
5.0000 mg | ORAL_TABLET | Freq: Two times a day (BID) | ORAL | 0 refills | Status: DC | PRN
  Filled 2021-01-27: qty 30, 15d supply, fill #0

## 2021-01-27 MED ORDER — SODIUM CHLORIDE 0.9 % IV SOLN: INTRAVENOUS | Status: AC

## 2021-01-27 MED ORDER — DEXTROSE 5 % IV SOLN: Freq: Once | INTRAVENOUS | Status: AC

## 2021-01-27 MED ORDER — POTASSIUM CHLORIDE CRYS ER 20 MEQ PO TBCR
EXTENDED_RELEASE_TABLET | ORAL | 0 refills | Status: DC
  Filled 2021-01-27: qty 36, 33d supply, fill #0

## 2021-01-27 MED ORDER — LEUCOVORIN CALCIUM INJECTION 350 MG
300.0000 mg/m2 | Freq: Once | INTRAVENOUS | Status: AC
  Administered 2021-01-27: 13:00:00 666 mg via INTRAVENOUS
  Filled 2021-01-27: qty 33.3

## 2021-01-27 NOTE — Patient Instructions (Signed)

## 2021-01-27 NOTE — Patient Instructions (Addendum)
[place]  Discharge Instructions: Thank you for choosing [facility] to provide your oncology and hematology care.   If you have a lab appointment with the [facility], please go directly to the [facility] and check in at the registration area.   Wear comfortable clothing and clothing appropriate for easy access to any Portacath or PICC line.   We strive to give you quality time with your provider. You may need to reschedule your appointment if you arrive late (15 or more minutes).  Arriving late affects you and other patients whose appointments are after yours.  Also, if you miss three or more appointments without notifying the office, you may be dismissed from the clinic at the providers discretion.      For prescription refill requests, have your pharmacy contact our office and allow 72 hours for refills to be completed.    Today you received the following chemotherapy and/or immunotherapy agents: oxaliplatin, leucovorin, fluorouracil.       To help prevent nausea and vomiting after your treatment, we encourage you to take your nausea medication as directed.  BELOW ARE SYMPTOMS THAT SHOULD BE REPORTED IMMEDIATELY: *FEVER GREATER THAN 100.4 F (38 C) OR HIGHER *CHILLS OR SWEATING *NAUSEA AND VOMITING THAT IS NOT CONTROLLED WITH YOUR NAUSEA MEDICATION *UNUSUAL SHORTNESS OF BREATH *UNUSUAL BRUISING OR BLEEDING *URINARY PROBLEMS (pain or burning when urinating, or frequent urination) *BOWEL PROBLEMS (unusual diarrhea, constipation, pain near the anus) TENDERNESS IN MOUTH AND THROAT WITH OR WITHOUT PRESENCE OF ULCERS (sore throat, sores in mouth, or a toothache) UNUSUAL RASH, SWELLING OR PAIN  UNUSUAL VAGINAL DISCHARGE OR ITCHING   Items with * indicate a potential emergency and should be followed up as soon as possible or go to the Emergency Department if any problems should occur.  Please show the CHEMOTHERAPY ALERT CARD or IMMUNOTHERAPY  ALERT CARD at check-in to the Emergency Department and triage nurse.  Should you have questions after your visit or need to cancel or reschedule your appointment, please contact [facility]  Dept: [phone]  and follow the prompts.  Office hours are 8:00 a.m. to 4:30 p.m. Monday - Friday. Please note that voicemails left after 4:00 p.m. may not be returned until the following business day.  We are closed weekends and major holidays. You have access to a nurse at all times for urgent questions. Please call the main number to the clinic Dept: [phone] and follow the prompts.   For any non-urgent questions, you may also contact your provider using MyChart. We now offer e-Visits for anyone 38 and older to request care online for non-urgent symptoms. For details visit [email or web address].   Also download the MyChart app! Go to the app store, search "MyChart", open the app, select [facility], and log in with your MyChart username and password.  Due to Covid, a mask is required upon entering the hospital/clinic. If you do not have a mask, one will be given to you upon arrival. For doctor visits, patients may have 1 support person aged 74 or older with them. For treatment visits, patients cannot have anyone with them due to current Covid guidelines and our immunocompromised population.   Oxaliplatin Injection What is this medication? OXALIPLATIN (ox AL i PLA tin) is a chemotherapy drug. It targets fast dividing cells, like cancer cells, and causes these cells to die. This medicine is used to treat cancers of the colon and rectum, and many other cancers.  This medicine may be used for other purposes; ask your health care provider or pharmacist if you have questions. COMMON BRAND NAME(S): Eloxatin What should I tell my care team before I take this medication? They need to know if you have any of these conditions: heart disease history of irregular heartbeat liver  disease low blood counts, like white cells, platelets, or red blood cells lung or breathing disease, like asthma take medicines that treat or prevent blood clots tingling of the fingers or toes, or other nerve disorder an unusual or allergic reaction to oxaliplatin, other chemotherapy, other medicines, foods, dyes, or preservatives pregnant or trying to get pregnant breast-feeding How should I use this medication? This drug is given as an infusion into a vein. It is administered in a hospital or clinic by a specially trained health care professional. Talk to your pediatrician regarding the use of this medicine in children. Special care may be needed. Overdosage: If you think you have taken too much of this medicine contact a poison control center or emergency room at once. NOTE: This medicine is only for you. Do not share this medicine with others. What if I miss a dose? It is important not to miss a dose. Call your doctor or health care professional if you are unable to keep an appointment. What may interact with this medication? Do not take this medicine with any of the following medications: cisapride dronedarone pimozide thioridazine This medicine may also interact with the following medications: aspirin and aspirin-like medicines certain medicines that treat or prevent blood clots like warfarin, apixaban, dabigatran, and rivaroxaban cisplatin cyclosporine diuretics medicines for infection like acyclovir, adefovir, amphotericin B, bacitracin, cidofovir, foscarnet, ganciclovir, gentamicin, pentamidine, vancomycin NSAIDs, medicines for pain and inflammation, like ibuprofen or naproxen other medicines that prolong the QT interval (an abnormal heart rhythm) pamidronate zoledronic acid This list may not describe all possible interactions. Give your health care provider a list of all the medicines, herbs, non-prescription drugs, or dietary supplements you use. Also tell them if you  smoke, drink alcohol, or use illegal drugs. Some items may interact with your medicine. What should I watch for while using this medication? Your condition will be monitored carefully while you are receiving this medicine. You may need blood work done while you are taking this medicine. This medicine may make you feel generally unwell. This is not uncommon as chemotherapy can affect healthy cells as well as cancer cells. Report any side effects. Continue your course of treatment even though you feel ill unless your healthcare professional tells you to stop. This medicine can make you more sensitive to cold. Do not drink cold drinks or use ice. Cover exposed skin before coming in contact with cold temperatures or cold objects. When out in cold weather wear warm clothing and cover your mouth and nose to warm the air that goes into your lungs. Tell your doctor if you get sensitive to the cold. Do not become pregnant while taking this medicine or for 9 months after stopping it. Women should inform their health care professional if they wish to become pregnant or think they might be pregnant. Men should not father a child while taking this medicine and for 6 months after stopping it. There is potential for serious side effects to an unborn child. Talk to your health care professional for more information. Do not breast-feed a child while taking this medicine or for 3 months after stopping it. This medicine has caused ovarian failure in some women. This  medicine may make it more difficult to get pregnant. Talk to your health care professional if you are concerned about your fertility. This medicine has caused decreased sperm counts in some men. This may make it more difficult to father a child. Talk to your health care professional if you are concerned about your fertility. This medicine may increase your risk of getting an infection. Call your health care professional for advice if you get a fever, chills, or  sore throat, or other symptoms of a cold or flu. Do not treat yourself. Try to avoid being around people who are sick. Avoid taking medicines that contain aspirin, acetaminophen, ibuprofen, naproxen, or ketoprofen unless instructed by your health care professional. These medicines may hide a fever. Be careful brushing or flossing your teeth or using a toothpick because you may get an infection or bleed more easily. If you have any dental work done, tell your dentist you are receiving this medicine. What side effects may I notice from receiving this medication? Side effects that you should report to your doctor or health care professional as soon as possible: allergic reactions like skin rash, itching or hives, swelling of the face, lips, or tongue breathing problems cough low blood counts - this medicine may decrease the number of white blood cells, red blood cells, and platelets. You may be at increased risk for infections and bleeding nausea, vomiting pain, redness, or irritation at site where injected pain, tingling, numbness in the hands or feet signs and symptoms of bleeding such as bloody or black, tarry stools; red or dark brown urine; spitting up blood or brown material that looks like coffee grounds; red spots on the skin; unusual bruising or bleeding from the eyes, gums, or nose signs and symptoms of a dangerous change in heartbeat or heart rhythm like chest pain; dizziness; fast, irregular heartbeat; palpitations; feeling faint or lightheaded; falls signs and symptoms of infection like fever; chills; cough; sore throat; pain or trouble passing urine signs and symptoms of liver injury like dark yellow or brown urine; general ill feeling or flu-like symptoms; light-colored stools; loss of appetite; nausea; right upper belly pain; unusually weak or tired; yellowing of the eyes or skin signs and symptoms of low red blood cells or anemia such as unusually weak or tired; feeling faint or  lightheaded; falls signs and symptoms of muscle injury like dark urine; trouble passing urine or change in the amount of urine; unusually weak or tired; muscle pain; back pain Side effects that usually do not require medical attention (report to your doctor or health care professional if they continue or are bothersome): changes in taste diarrhea gas hair loss loss of appetite mouth sores This list may not describe all possible side effects. Call your doctor for medical advice about side effects. You may report side effects to FDA at 1-800-FDA-1088. Where should I keep my medication? This drug is given in a hospital or clinic and will not be stored at home. NOTE: This sheet is a summary. It may not cover all possible information. If you have questions about this medicine, talk to your doctor, pharmacist, or health care provider.  2022 Elsevier/Gold Standard (2020-10-15 00:00:00)  Leucovorin injection What is this medication? LEUCOVORIN (loo koe VOR in) is used to prevent or treat the harmful effects of some medicines. This medicine is used to treat anemia caused by a low amount of folic acid in the body. It is also used with 5-fluorouracil (5-FU) to treat colon cancer. This medicine  may be used for other purposes; ask your health care provider or pharmacist if you have questions. What should I tell my care team before I take this medication? They need to know if you have any of these conditions: anemia from low levels of vitamin B-12 in the blood an unusual or allergic reaction to leucovorin, folic acid, other medicines, foods, dyes, or preservatives pregnant or trying to get pregnant breast-feeding How should I use this medication? This medicine is for injection into a muscle or into a vein. It is given by a health care professional in a hospital or clinic setting. Talk to your pediatrician regarding the use of this medicine in children. Special care may be needed. Overdosage: If you  think you have taken too much of this medicine contact a poison control center or emergency room at once. NOTE: This medicine is only for you. Do not share this medicine with others. What if I miss a dose? This does not apply. What may interact with this medication? capecitabine fluorouracil phenobarbital phenytoin primidone trimethoprim-sulfamethoxazole This list may not describe all possible interactions. Give your health care provider a list of all the medicines, herbs, non-prescription drugs, or dietary supplements you use. Also tell them if you smoke, drink alcohol, or use illegal drugs. Some items may interact with your medicine. What should I watch for while using this medication? Your condition will be monitored carefully while you are receiving this medicine. This medicine may increase the side effects of 5-fluorouracil, 5-FU. Tell your doctor or health care professional if you have diarrhea or mouth sores that do not get better or that get worse. What side effects may I notice from receiving this medication? Side effects that you should report to your doctor or health care professional as soon as possible: allergic reactions like skin rash, itching or hives, swelling of the face, lips, or tongue breathing problems fever, infection mouth sores unusual bleeding or bruising unusually weak or tired Side effects that usually do not require medical attention (report to your doctor or health care professional if they continue or are bothersome): constipation or diarrhea loss of appetite nausea, vomiting This list may not describe all possible side effects. Call your doctor for medical advice about side effects. You may report side effects to FDA at 1-800-FDA-1088. Where should I keep my medication? This drug is given in a hospital or clinic and will not be stored at home. NOTE: This sheet is a summary. It may not cover all possible information. If you have questions about this  medicine, talk to your doctor, pharmacist, or health care provider.  2022 Elsevier/Gold Standard (2007-08-04 00:00:00)  Fluorouracil, 5-FU injection What is this medication? FLUOROURACIL, 5-FU (flure oh YOOR a sil) is a chemotherapy drug. It slows the growth of cancer cells. This medicine is used to treat many types of cancer like breast cancer, colon or rectal cancer, pancreatic cancer, and stomach cancer. This medicine may be used for other purposes; ask your health care provider or pharmacist if you have questions. COMMON BRAND NAME(S): Adrucil What should I tell my care team before I take this medication? They need to know if you have any of these conditions: blood disorders dihydropyrimidine dehydrogenase (DPD) deficiency infection (especially a virus infection such as chickenpox, cold sores, or herpes) kidney disease liver disease malnourished, poor nutrition recent or ongoing radiation therapy an unusual or allergic reaction to fluorouracil, other chemotherapy, other medicines, foods, dyes, or preservatives pregnant or trying to get pregnant breast-feeding How should  I use this medication? This drug is given as an infusion or injection into a vein. It is administered in a hospital or clinic by a specially trained health care professional. Talk to your pediatrician regarding the use of this medicine in children. Special care may be needed. Overdosage: If you think you have taken too much of this medicine contact a poison control center or emergency room at once. NOTE: This medicine is only for you. Do not share this medicine with others. What if I miss a dose? It is important not to miss your dose. Call your doctor or health care professional if you are unable to keep an appointment. What may interact with this medication? Do not take this medicine with any of the following medications: live virus vaccines This medicine may also interact with the following medications: medicines  that treat or prevent blood clots like warfarin, enoxaparin, and dalteparin This list may not describe all possible interactions. Give your health care provider a list of all the medicines, herbs, non-prescription drugs, or dietary supplements you use. Also tell them if you smoke, drink alcohol, or use illegal drugs. Some items may interact with your medicine. What should I watch for while using this medication? Visit your doctor for checks on your progress. This drug may make you feel generally unwell. This is not uncommon, as chemotherapy can affect healthy cells as well as cancer cells. Report any side effects. Continue your course of treatment even though you feel ill unless your doctor tells you to stop. In some cases, you may be given additional medicines to help with side effects. Follow all directions for their use. Call your doctor or health care professional for advice if you get a fever, chills or sore throat, or other symptoms of a cold or flu. Do not treat yourself. This drug decreases your body's ability to fight infections. Try to avoid being around people who are sick. This medicine may increase your risk to bruise or bleed. Call your doctor or health care professional if you notice any unusual bleeding. Be careful brushing and flossing your teeth or using a toothpick because you may get an infection or bleed more easily. If you have any dental work done, tell your dentist you are receiving this medicine. Avoid taking products that contain aspirin, acetaminophen, ibuprofen, naproxen, or ketoprofen unless instructed by your doctor. These medicines may hide a fever. Do not become pregnant while taking this medicine. Women should inform their doctor if they wish to become pregnant or think they might be pregnant. There is a potential for serious side effects to an unborn child. Talk to your health care professional or pharmacist for more information. Do not breast-feed an infant while taking  this medicine. Men should inform their doctor if they wish to father a child. This medicine may lower sperm counts. Do not treat diarrhea with over the counter products. Contact your doctor if you have diarrhea that lasts more than 2 days or if it is severe and watery. This medicine can make you more sensitive to the sun. Keep out of the sun. If you cannot avoid being in the sun, wear protective clothing and use sunscreen. Do not use sun lamps or tanning beds/booths. What side effects may I notice from receiving this medication? Side effects that you should report to your doctor or health care professional as soon as possible: allergic reactions like skin rash, itching or hives, swelling of the face, lips, or tongue low blood counts - this  medicine may decrease the number of white blood cells, red blood cells and platelets. You may be at increased risk for infections and bleeding. signs of infection - fever or chills, cough, sore throat, pain or difficulty passing urine signs of decreased platelets or bleeding - bruising, pinpoint red spots on the skin, black, tarry stools, blood in the urine signs of decreased red blood cells - unusually weak or tired, fainting spells, lightheadedness breathing problems changes in vision chest pain mouth sores nausea and vomiting pain, swelling, redness at site where injected pain, tingling, numbness in the hands or feet redness, swelling, or sores on hands or feet stomach pain unusual bleeding Side effects that usually do not require medical attention (report to your doctor or health care professional if they continue or are bothersome): changes in finger or toe nails diarrhea dry or itchy skin hair loss headache loss of appetite sensitivity of eyes to the light stomach upset unusually teary eyes This list may not describe all possible side effects. Call your doctor for medical advice about side effects. You may report side effects to FDA at  1-800-FDA-1088. Where should I keep my medication? This drug is given in a hospital or clinic and will not be stored at home. NOTE: This sheet is a summary. It may not cover all possible information. If you have questions about this medicine, talk to your doctor, pharmacist, or health care provider.  2022 Elsevier/Gold Standard (2020-10-15 00:00:00)  The chemotherapy medication bag should finish at 46 hours, 96 hours, or 7 days. For example, if your pump is scheduled for 46 hours and it was put on at 4:00 p.m., it should finish at 2:00 p.m. the day it is scheduled to come off regardless of your appointment time.     Estimated time to finish at 12:45pm on 01/29/21..   If the display on your pump reads "Low Volume" and it is beeping, take the batteries out of the pump and come to the cancer center for it to be taken off.   If the pump alarms go off prior to the pump reading "Low Volume" then call [phone] and someone can assist you.  If the plunger comes out and the chemotherapy medication is leaking out, please use your home chemo spill kit to clean up the spill. Do NOT use paper towels or other household products.  If you have problems or questions regarding your pump, please call either [phone] (24 hours a day) or the cancer center Monday-Friday 8:00 a.m.- 4:30 p.m. at the clinic number and we will assist you. If you are unable to get assistance, then go to the nearest Emergency Department and ask the staff to contact the IV team for assistance.

## 2021-01-27 NOTE — Progress Notes (Signed)
PATIENT NAVIGATOR PROGRESS NOTE  Name: Erin [name] Date: 01/27/2021 MRN: [id number]  DOB: Sep 07, 1965   Reason for visit:  F/U appt and 3rd cycle of FOLFOX.   Comments:  Met Erin [name] in clinic and discussed adjustment to treatment and diagnosis, nutritional needs, she is able to get groceries with niece, transportation, application for financial needs as she is currently unable to work. She will email form/letter needed for grant applications.  Will place nutrition referral for phone visit. She is set up with [employer].  Given contact information to contact me with any issues or concerns    Time spent counseling/coordinating care: > 60 minutes

## 2021-01-27 NOTE — Progress Notes (Signed)
Patient presents for treatment. RN assessment completed along with the following:  Labs/vitals reviewed - Yes, and Per Ned Card, NP ok to start premeds and fluids with HR 130 . Per Ned Card, Ok to treat with K 2.9. VS recheck at 1145--Per Ned Card, Ok to proceed with treatment with HR 116.  Weight within 10% of previous measurement - Yes Oncology Treatment Attestation completed for current therapy- No, message sent to provider regarding need for treatment attestation completion. Informed consent completed and reflects current therapy/intent - Yes, on date 12/27/20             Provider progress note reviewed -Today's provider note not available. This RN has been given verbal orders to start premedications and fluids.  HR recheck after fluids are completed.  Treatment/Antibody/Supportive plan reviewed - Yes, and patient to get fluids with treatment today.  S&H and other orders reviewed - Yes, and fluid orders in under signed an held.  Previous treatment date reviewed - Yes, and the appropriate amount of time has elapsed between treatments. Clinic Hand Off Received from - Ned Card, NP  Patient to proceed with treatment.

## 2021-01-27 NOTE — Progress Notes (Signed)
[place] OFFICE PROGRESS NOTE   Diagnosis: Unknown primary, likely gastrointestinal  INTERVAL HISTORY:   Erin [name] returns for follow-up.  She completed cycle 2 FOLFOX 01/10/2021.  She denies nausea/vomiting.  No mouth sores.  No diarrhea.  She avoids cold exposure.  No numbness or tingling today.  Appetite overall is poor.  No shortness of breath or chest pain.  She continues to have abdominal pain.  She estimates taking oxycodone 2-4 times a day.  She states that she feels much better than when she was in the hospital.  Objective:  Vital signs in last 24 hours:  Blood pressure 113/79, pulse (!) 130, temperature 98.7 F (37.1 C), temperature source Oral, resp. rate 18, height '5\' 4"'  (1.626 m), weight 240 lb (108.9 kg), SpO2 98 %.  Repeat heart rate 112    HEENT: No thrush or ulcers. Resp: Lungs clear bilaterally. Cardio: Regular, tachycardic. GI: Abdomen is soft, generalized tenderness. Vascular: No leg edema. Port-A-Cath without erythema.  Lab Results:  Lab Results  Component Value Date   WBC 5.5 01/27/2021   HGB 9.1 (L) 01/27/2021   HCT 29.1 (L) 01/27/2021   MCV 91.8 01/27/2021   PLT 185 01/27/2021   NEUTROABS 3.6 01/27/2021    Imaging:  No results found.  Medications: I have reviewed the patient's current medications.  Assessment/Plan: 1.  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 -Bladder neck, urethra, cervical biopsies 12/17/2020 -MSS, tumor mutation burden 4, K-ras amplification, PD-L1 tumor proportion score-0 -[facility] -90% intestinal malignancy (colorectal adenocarcinoma 85%) small intestine adenocarcinoma less than 5%, gastroesophageal adenocarcinoma not excluded -12/05/2020 MRI of the pelvis-poorly marginated enhancing 3.7 x 2.8 x 3.0 cm mass centered at the urethra, solid avidly enhancing bilateral ovarian masses, poorly marginated enhancing 2.4 x 2.3 x 3.0 cm mass in the left uterine  cervix, mild to moderate bilateral common iliac, bilateral external iliac, and bilateral inguinal lymphadenopathy, diffuse patchy confluent nodular replacement of the pelvic osseous structures. -12/13/2020 CEA 1864, CA125 29.2 -12/16/2020 CT abdomen/pelvis-rapidly increasing size of the left ovary with some adjacent ascites, enlarging masses elsewhere, masslike area of the cervix and potentially within a urethral diverticulum, well-circumscribed left adrenal lesion measuring 3.2 x 2.6 cm, heterogeneous pattern of subtle sclerosis and lucency in the spine. -12/16/2020 MRI of the lumbar spine-diffusely abnormal appearance of the bone marrow throughout the visualized lumbar spine and pelvis highly suspicious for diffuse osseous metastatic disease, retroperitoneal/iliac adenopathy with left larger than right adnexal masses. -12/17/2020 CT chest-no acute intrathoracic pathology -Upper endoscopy 12/22/2020-gastritis, gastric nodule biopsy-adenocarcinoma, poorly differentiated with signet ring morphology -Colonoscopy 12/22/2020-polyps removed from the ascending and sigmoid colon, extrinsic compression of the sigmoid colon-tubulovillous adenoma without high-grade dysplasia, tubular adenoma and hyperplastic polyps -12/27/2020 cycle #1 FOLFOX -01/10/2021 cycle #2 FOLFOX -01/27/2021 cycle 3 FOLFOX, oxaliplatin dose reduced 2.  Abdominal pain secondary #1 3.  Constipation 4.  Normocytic anemia 5.  Leukocytosis 6.  Protein calorie malnutrition 7.  Obstructive sleep apnea 8.  Diabetes mellitus 9.  Hypertension 10.  Possible pulmonary hypertension noted on CT chest  Disposition: Erin [name] appears stable.  She has completed 2 cycles of FOLFOX.  She seems to have tolerated well.  Plan to proceed with cycle 3 today as scheduled.  Oxaliplatin will be dose reduced.  CBC and chemistry panel reviewed.  Labs adequate to proceed with treatment today.  The creatinine is elevated from baseline.  She is tachycardic.  She  may be dehydrated.  She will receive additional IV fluids  today with repeat vital signs.  She has hypokalemia.  She will begin a potassium supplement.  She will return in 1 week for a follow-up basic metabolic panel.  We will see her in follow-up in approximately 2 weeks.  We are available to see her sooner if needed.   Ned Card ANP/GNP-BC   01/27/2021  2:31 PM This was a shared visit with Ned Card.  Erin [name] was interviewed and examined.  Her overall performance status has improved since beginning systemic treatment for the metastatic carcinoma.  She has lost weight.  The chemotherapy was dose adjusted based on her weight today.  I dose reduce the oxaliplatin due to the elevated creatinine.  She will return for a repeat chemistry panel next week.  I was present for greater than 50% of today's visit.  I performed medical decision making.  [name], MD

## 2021-01-27 NOTE — Progress Notes (Signed)
Patient seen by Lisa Thomas NP today  Vitals are within treatment parameters.  Labs reviewed by Lisa Thomas NP and are within treatment parameters.  Per physician team, patient is ready for treatment and there are NO modifications to the treatment plan.     

## 2021-01-28 ENCOUNTER — Encounter: Payer: Self-pay | Admitting: *Deleted

## 2021-01-28 ENCOUNTER — Encounter: Payer: Self-pay | Admitting: Oncology

## 2021-01-28 NOTE — Progress Notes (Signed)
Transportation arranged via [employer] for appts on 12/21, 12/27 and 1/3.

## 2021-01-29 ENCOUNTER — Inpatient Hospital Stay: Payer: Self-pay

## 2021-01-29 ENCOUNTER — Other Ambulatory Visit: Payer: Self-pay

## 2021-01-29 VITALS — BP 111/70 | HR 116 | Temp 97.8°F | Resp 20

## 2021-01-29 DIAGNOSIS — C169 Malignant neoplasm of stomach, unspecified: Secondary | ICD-10-CM

## 2021-01-29 MED ORDER — SODIUM CHLORIDE 0.9% FLUSH
10.0000 mL | INTRAVENOUS | Status: DC | PRN
  Administered 2021-01-29: 13:00:00 10 mL

## 2021-01-29 MED ORDER — HEPARIN SOD (PORK) LOCK FLUSH 100 UNIT/ML IV SOLN
500.0000 [IU] | Freq: Once | INTRAVENOUS | Status: AC | PRN
  Administered 2021-01-29: 13:00:00 500 [IU]

## 2021-02-04 ENCOUNTER — Inpatient Hospital Stay: Payer: Self-pay

## 2021-02-10 ENCOUNTER — Other Ambulatory Visit: Payer: Self-pay | Admitting: Oncology

## 2021-02-11 ENCOUNTER — Inpatient Hospital Stay: Payer: 59

## 2021-02-11 ENCOUNTER — Other Ambulatory Visit: Payer: Self-pay

## 2021-02-11 ENCOUNTER — Ambulatory Visit: Payer: Self-pay

## 2021-02-11 ENCOUNTER — Other Ambulatory Visit (HOSPITAL_COMMUNITY): Payer: Self-pay

## 2021-02-11 ENCOUNTER — Encounter: Payer: Self-pay | Admitting: *Deleted

## 2021-02-11 ENCOUNTER — Other Ambulatory Visit: Payer: Self-pay | Admitting: *Deleted

## 2021-02-11 ENCOUNTER — Inpatient Hospital Stay: Payer: 59 | Attending: Gynecologic Oncology | Admitting: Oncology

## 2021-02-11 VITALS — BP 102/77 | HR 100 | Temp 98.1°F | Resp 18 | Ht 64.0 in | Wt 229.0 lb

## 2021-02-11 DIAGNOSIS — Z5111 Encounter for antineoplastic chemotherapy: Secondary | ICD-10-CM | POA: Diagnosis present

## 2021-02-11 DIAGNOSIS — K5900 Constipation, unspecified: Secondary | ICD-10-CM | POA: Insufficient documentation

## 2021-02-11 DIAGNOSIS — D3911 Neoplasm of uncertain behavior of right ovary: Secondary | ICD-10-CM | POA: Insufficient documentation

## 2021-02-11 DIAGNOSIS — D3912 Neoplasm of uncertain behavior of left ovary: Secondary | ICD-10-CM | POA: Insufficient documentation

## 2021-02-11 DIAGNOSIS — Z95828 Presence of other vascular implants and grafts: Secondary | ICD-10-CM

## 2021-02-11 DIAGNOSIS — E119 Type 2 diabetes mellitus without complications: Secondary | ICD-10-CM | POA: Insufficient documentation

## 2021-02-11 DIAGNOSIS — D649 Anemia, unspecified: Secondary | ICD-10-CM | POA: Diagnosis not present

## 2021-02-11 DIAGNOSIS — C169 Malignant neoplasm of stomach, unspecified: Secondary | ICD-10-CM

## 2021-02-11 DIAGNOSIS — G4733 Obstructive sleep apnea (adult) (pediatric): Secondary | ICD-10-CM | POA: Insufficient documentation

## 2021-02-11 DIAGNOSIS — D72829 Elevated white blood cell count, unspecified: Secondary | ICD-10-CM | POA: Diagnosis not present

## 2021-02-11 DIAGNOSIS — Z79899 Other long term (current) drug therapy: Secondary | ICD-10-CM | POA: Diagnosis not present

## 2021-02-11 DIAGNOSIS — E46 Unspecified protein-calorie malnutrition: Secondary | ICD-10-CM | POA: Diagnosis not present

## 2021-02-11 DIAGNOSIS — I10 Essential (primary) hypertension: Secondary | ICD-10-CM | POA: Insufficient documentation

## 2021-02-11 DIAGNOSIS — C799 Secondary malignant neoplasm of unspecified site: Secondary | ICD-10-CM

## 2021-02-11 LAB — CBC WITH DIFFERENTIAL (CANCER CENTER ONLY)
Abs Immature Granulocytes: 0.2 10*3/uL — ABNORMAL HIGH (ref 0.00–0.07)
Band Neutrophils: 10 %
Basophils Absolute: 0 10*3/uL (ref 0.0–0.1)
Basophils Relative: 0 %
Eosinophils Absolute: 0 10*3/uL (ref 0.0–0.5)
Eosinophils Relative: 0 %
HCT: 25.1 % — ABNORMAL LOW (ref 36.0–46.0)
Hemoglobin: 7.8 g/dL — ABNORMAL LOW (ref 12.0–15.0)
Lymphocytes Relative: 25 %
Lymphs Abs: 1 10*3/uL (ref 0.7–4.0)
MCH: 28.1 pg (ref 26.0–34.0)
MCHC: 31.1 g/dL (ref 30.0–36.0)
MCV: 90.3 fL (ref 80.0–100.0)
Metamyelocytes Relative: 1 %
Monocytes Absolute: 0.1 10*3/uL (ref 0.1–1.0)
Monocytes Relative: 3 %
Myelocytes: 3 %
Neutro Abs: 2.7 10*3/uL (ref 1.7–7.7)
Neutrophils Relative %: 58 %
Platelet Count: 147 10*3/uL — ABNORMAL LOW (ref 150–400)
RBC: 2.78 MIL/uL — ABNORMAL LOW (ref 3.87–5.11)
RDW: 17.3 % — ABNORMAL HIGH (ref 11.5–15.5)
Smear Review: DECREASED
WBC Count: 3.9 10*3/uL — ABNORMAL LOW (ref 4.0–10.5)
nRBC: 1 % — ABNORMAL HIGH (ref 0.0–0.2)
nRBC: 2 /100 WBC — ABNORMAL HIGH

## 2021-02-11 LAB — CMP (CANCER CENTER ONLY)
ALT: <5 U/L (ref 0–44)
AST: 16 U/L (ref 15–41)
Albumin: 3.3 g/dL — ABNORMAL LOW (ref 3.5–5.0)
Alkaline Phosphatase: 425 U/L — ABNORMAL HIGH (ref 38–126)
Anion gap: 14 (ref 5–15)
BUN: 24 mg/dL — ABNORMAL HIGH (ref 6–20)
CO2: 26 mmol/L (ref 22–32)
Calcium: 9.2 mg/dL (ref 8.9–10.3)
Chloride: 97 mmol/L — ABNORMAL LOW (ref 98–111)
Creatinine: 1.24 mg/dL — ABNORMAL HIGH (ref 0.44–1.00)
GFR, Estimated: 52 mL/min — ABNORMAL LOW (ref >60)
Glucose, Bld: 305 mg/dL — ABNORMAL HIGH (ref 70–99)
Potassium: 2.6 mmol/L — CL (ref 3.5–5.1)
Sodium: 137 mmol/L (ref 135–145)
Total Bilirubin: 1.3 mg/dL — ABNORMAL HIGH (ref 0.3–1.2)
Total Protein: 6.8 g/dL (ref 6.5–8.1)

## 2021-02-11 LAB — SURGICAL PATHOLOGY

## 2021-02-11 LAB — MAGNESIUM: Magnesium: 1.4 mg/dL — ABNORMAL LOW (ref 1.7–2.4)

## 2021-02-11 LAB — CEA (ACCESS): CEA (CHCC): 617.97 ng/mL — ABNORMAL HIGH (ref 0.00–5.00)

## 2021-02-11 MED ORDER — ONDANSETRON HCL 8 MG PO TABS
8.0000 mg | ORAL_TABLET | Freq: Three times a day (TID) | ORAL | 0 refills | Status: AC | PRN
  Filled 2021-02-11: qty 20, 7d supply, fill #0

## 2021-02-11 MED ORDER — ONDANSETRON HCL 8 MG PO TABS
8.0000 mg | ORAL_TABLET | Freq: Once | ORAL | Status: AC
  Administered 2021-02-11: 8 mg via ORAL
  Filled 2021-02-11: qty 1

## 2021-02-11 MED ORDER — SODIUM CHLORIDE 0.9% FLUSH
10.0000 mL | INTRAVENOUS | Status: DC | PRN
  Administered 2021-02-11: 10 mL via INTRAVENOUS

## 2021-02-11 MED ORDER — POTASSIUM CHLORIDE ER 10 MEQ PO CPCR
10.0000 meq | ORAL_CAPSULE | Freq: Three times a day (TID) | ORAL | 0 refills | Status: DC
  Filled 2021-02-11: qty 30, 10d supply, fill #0

## 2021-02-11 MED ORDER — OXYCODONE HCL 5 MG PO TABS
5.0000 mg | ORAL_TABLET | Freq: Once | ORAL | Status: AC
  Administered 2021-02-11: 5 mg via ORAL
  Filled 2021-02-11: qty 1

## 2021-02-11 MED ORDER — SODIUM CHLORIDE 0.9% FLUSH
10.0000 mL | Freq: Once | INTRAVENOUS | Status: AC
  Administered 2021-02-11: 10 mL via INTRAVENOUS

## 2021-02-11 MED ORDER — SODIUM CHLORIDE 0.9 % IV SOLN: Freq: Once | INTRAVENOUS | Status: AC

## 2021-02-11 MED ORDER — HEPARIN SOD (PORK) LOCK FLUSH 100 UNIT/ML IV SOLN
500.0000 [IU] | Freq: Once | INTRAVENOUS | Status: AC
  Administered 2021-02-11: 500 [IU] via INTRAVENOUS

## 2021-02-11 MED ORDER — ONDANSETRON HCL 8 MG PO TABS: 8.0000 mg | ORAL_TABLET | Freq: Once | ORAL | Status: DC

## 2021-02-11 MED ORDER — HEPARIN SOD (PORK) LOCK FLUSH 100 UNIT/ML IV SOLN: 500.0000 [IU] | Freq: Once | INTRAVENOUS | Status: DC

## 2021-02-11 MED ORDER — PROCHLORPERAZINE MALEATE 10 MG PO TABS
10.0000 mg | ORAL_TABLET | Freq: Four times a day (QID) | ORAL | 0 refills | Status: DC | PRN
  Filled 2021-02-11: qty 30, 8d supply, fill #0

## 2021-02-11 NOTE — Progress Notes (Signed)
CRITICAL VALUE STICKER  CRITICAL VALUE:K+ 2.6  RECEIVER (on-site recipient of call):Griffyn Kucinski,RN  DATE & TIME NOTIFIED: 02/12/24 @ 1521  MESSENGER (representative from lab):Tarry Kos  MD NOTIFIED: Dr. Benay Spice  TIME OF NOTIFICATION:1530  RESPONSE:  Start Micro-K with IVF 1/3 and 1/4

## 2021-02-11 NOTE — Addendum Note (Signed)
Addended by: Roselind Messier A on: 02/11/2021 03:50 PM   Modules accepted: Orders

## 2021-02-11 NOTE — Patient Instructions (Signed)

## 2021-02-11 NOTE — Addendum Note (Signed)
Addended by: Deatra Robinson B on: 02/11/2021 03:47 PM   Modules accepted: Orders

## 2021-02-11 NOTE — Patient Instructions (Signed)

## 2021-02-11 NOTE — Progress Notes (Signed)
In for F/U visit today, needs to establish PCP, referral sent to [facility]

## 2021-02-11 NOTE — Addendum Note (Signed)
Addended by: Roselind Messier A on: 02/11/2021 03:48 PM   Modules accepted: Orders

## 2021-02-11 NOTE — Progress Notes (Signed)
[place] OFFICE PROGRESS NOTE   Diagnosis: Gastrointestinal cancer  INTERVAL HISTORY:   Erin [name] completed on cycle of FOLFOX on 01/27/2021.  She reports nausea with intermittent vomiting since completing chemotherapy.  She continues to have nausea.  She has diarrhea.  She had cold sensitivity for approximately a week following chemotherapy.  This has resolved.  No other neuropathy symptoms.  Abdominal pain has improved.  She has intermittent blood when wiping after urination.  She reports a small amount of blood when she vomits.  She reports somnolence when she takes oxycodone and Flexeril.  Objective:  Vital signs in last 24 hours:  Blood pressure 102/77, pulse 100, temperature 98.1 F (36.7 C), temperature source Oral, resp. rate 18, height '5\' 4"'  (1.626 m), weight 229 lb (103.9 kg), SpO2 98 %.    HEENT: No thrush or ulcers Resp: Lungs clear bilaterally Cardio: Regular rate and rhythm, tachycardia GI: Firm fullness in the left lower abdomen with associated tenderness, no hepatomegaly Vascular: No leg edema    Portacath/PICC-without erythema  Lab Results:  Lab Results  Component Value Date   WBC 3.9 (L) 02/11/2021   HGB 7.8 (L) 02/11/2021   HCT 25.1 (L) 02/11/2021   MCV 90.3 02/11/2021   PLT 147 (L) 02/11/2021   NEUTROABS 2.7 02/11/2021    CMP  Lab Results  Component Value Date   NA 137 02/11/2021   K 2.6 (LL) 02/11/2021   CL 97 (L) 02/11/2021   CO2 26 02/11/2021   GLUCOSE 305 (H) 02/11/2021   BUN 24 (H) 02/11/2021   CREATININE 1.24 (H) 02/11/2021   CALCIUM 9.2 02/11/2021   PROT 6.8 02/11/2021   ALBUMIN 3.3 (L) 02/11/2021   AST 16 02/11/2021   ALT <5 02/11/2021   ALKPHOS 425 (H) 02/11/2021   BILITOT 1.3 (H) 02/11/2021   GFRNONAA 52 (L) 02/11/2021   GFRAA 78 03/08/2020    Lab Results  Component Value Date   CEA1 1,864.09 (H) 12/13/2020   CEA 617.97 (H) 02/11/2021    Medications: I have reviewed the patient's current  medications.   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 -Bladder neck, urethra, cervical biopsies 12/17/2020 -MSS, tumor mutation burden 4, K-ras amplification, PD-L1 tumor proportion score-0 -[facility] -90% intestinal malignancy (colorectal adenocarcinoma 85%) small intestine adenocarcinoma less than 5%, gastroesophageal adenocarcinoma not excluded -12/05/2020 MRI of the pelvis-poorly marginated enhancing 3.7 x 2.8 x 3.0 cm mass centered at the urethra, solid avidly enhancing bilateral ovarian masses, poorly marginated enhancing 2.4 x 2.3 x 3.0 cm mass in the left uterine cervix, mild to moderate bilateral common iliac, bilateral external iliac, and bilateral inguinal lymphadenopathy, diffuse patchy confluent nodular replacement of the pelvic osseous structures. -12/13/2020 CEA 1864, CA125 29.2 -12/16/2020 CT abdomen/pelvis-rapidly increasing size of the left ovary with some adjacent ascites, enlarging masses elsewhere, masslike area of the cervix and potentially within a urethral diverticulum, well-circumscribed left adrenal lesion measuring 3.2 x 2.6 cm, heterogeneous pattern of subtle sclerosis and lucency in the spine. -12/16/2020 MRI of the lumbar spine-diffusely abnormal appearance of the bone marrow throughout the visualized lumbar spine and pelvis highly suspicious for diffuse osseous metastatic disease, retroperitoneal/iliac adenopathy with left larger than right adnexal masses. -12/17/2020 CT chest-no acute intrathoracic pathology -Upper endoscopy 12/22/2020-gastritis, gastric nodule biopsy-adenocarcinoma, poorly differentiated with signet ring morphology -Colonoscopy 12/22/2020-polyps removed from the ascending and sigmoid colon, extrinsic compression of the sigmoid colon-tubulovillous adenoma without high-grade dysplasia, tubular adenoma and hyperplastic polyps -12/27/2020 cycle #1 FOLFOX -01/10/2021 cycle #  2  FOLFOX -01/27/2021 cycle 3 FOLFOX, oxaliplatin dose reduced 2.  Abdominal pain secondary #1 3.  Constipation 4.  Normocytic anemia 5.  Leukocytosis 6.  Protein calorie malnutrition 7.  Obstructive sleep apnea 8.  Diabetes mellitus 9.  Hypertension 10.  Possible pulmonary hypertension noted on CT chest    Disposition: Erin [name] has metastatic adenocarcinoma, most likely of gastrointestinal origin.  She has completed 3 cycles of FOLFOX.  The CEA is lower.  She has developed increased nausea following the most recent cycle of chemotherapy.  She has lost weight.  She reports difficulty taking potassium pills.  Chemotherapy scheduled for 02/12/2021 will be placed on hold.  She will receive intravenous fluids today and again on 02/12/2021.  She will begin Micro-K.  We will check the magnesium.  She will return for an office visit on 02/19/2021. She Her has a primary provider.  We will try to get her established with Dr. Burnard Bunting here.  She needs help with diabetes management.  The hemoglobin is lower.  This is secondary to bleeding and chemotherapy.  We will provide transfusion support if the hemoglobin is lower when she returns next week.  I recommended she discontinue Flexeril.  She will take 5 mg of oxycodone as needed for pain.   Betsy Coder, MD  02/11/2021  4:01 PM

## 2021-02-11 NOTE — Progress Notes (Deleted)
Cardiology Office Note    Date:  02/11/2021   ID:  Erin [name], DOB 1965-04-16, MRN [id number]   PCP:  Pcp, No   [place]  Cardiologist:  Dorris Carnes, MD *** Advanced Practice Provider:  No care team member to display Electrophysiologist:  None   [id number]}   No chief complaint on file.   History of Present Illness:  Erin [name] is a 56 y.o. female with history of hypertension, HLD, family history of CAD, morbid obesity, OSA on CPAP.  Patient last saw Dr. Harrington Challenger and was doing well.  CT of the chest 12/17/2020 three-vessel coronary vascular calcification pulmonary hypertension but evaluation of pulmonary arteries limited due to respiratory motion artifact  Currently being treated for poorly differentiated adenocarcinoma with signet ring cell features question gastric primary undergoing chemo  Past Medical History:  Diagnosis Date   Anxiety    Constipation    Depression    GERD (gastroesophageal reflux disease)    Pt states she no longer needs meds   Hyperlipidemia    Hypertension    Menorrhagia    OSA (obstructive sleep apnea) 11/25/2017   OSA on CPAP    Other fatigue    Shortness of breath on exertion    Type 2 diabetes mellitus ([place])    Vitamin D deficiency    Wears glasses     Past Surgical History:  Procedure Laterality Date   BIOPSY  12/22/2020   Procedure: BIOPSY;  Surgeon: Lavena Bullion, DO;  Location: [facility];  Service: Endoscopy;;   BREAST EXCISIONAL BIOPSY     COLONOSCOPY WITH PROPOFOL N/A 12/22/2020   Procedure: COLONOSCOPY WITH PROPOFOL;  Surgeon: Lavena Bullion, DO;  Location: [facility];  Service: Endoscopy;  Laterality: N/A;   CYSTOSCOPY N/A 12/17/2020   Procedure: CYSTOSCOPY with biopsies;  Surgeon: Robley Fries, MD;  Location: [facility];  Service: Urology;  Laterality: N/A;   DILITATION & CURRETTAGE/HYSTROSCOPY WITH NOVASURE ABLATION N/A 08/26/2015   Procedure: DILATATION &  CURETTAGE/HYSTEROSCOPY WITH NOVASURE ABLATION;  Surgeon: [name], MD;  Location: [facility];  Service: Gynecology;  Laterality: N/A;   ESOPHAGOGASTRODUODENOSCOPY (EGD) WITH PROPOFOL N/A 12/22/2020   Procedure: ESOPHAGOGASTRODUODENOSCOPY (EGD) WITH PROPOFOL;  Surgeon: Lavena Bullion, DO;  Location: [facility];  Service: Endoscopy;  Laterality: N/A;   EXCISIONAL LEFT BREAST BX  11-14-2007   benign   IR IMAGING GUIDED PORT INSERTION  12/26/2020   LAPAROSCOPIC TUBAL LIGATION Bilateral 08/26/2015   Procedure: LAPAROSCOPIC TUBAL LIGATION;  Surgeon: [name], MD;  Location: [place];  Service: Gynecology;  Laterality: Bilateral;   POLYPECTOMY  12/22/2020   Procedure: POLYPECTOMY;  Surgeon: Lavena Bullion, DO;  Location: [facility];  Service: Endoscopy;;   SHOULDER ARTHROSCOPY WITH ROTATOR CUFF REPAIR AND SUBACROMIAL DECOMPRESSION Right 04/08/2020   Procedure: SHOULDER ARTHROSCOPY WITH ROTATOR CUFF REPAIR AND SUBACROMIAL DECOMPRESSION;  Surgeon: Tania Ade, MD;  Location: [facility];  Service: Orthopedics;  Laterality: Right;  NEED 90 MINUTES FOR THIS CASE   THYROID LOBECTOMY Right 04-30-2008    Current Medications: No outpatient medications have been marked as taking for the 02/18/21 encounter (Appointment) with Erin Burn, PA-C.     Allergies:   Tramadol   Social History   Socioeconomic History   Marital status: Widowed    Spouse name: Not on file   Number of children: 0   Years of education: Not on file   Highest education level: Not on file  Occupational  History   Occupation: Lobbyist: [employer]  Tobacco Use   Smoking status: Former    Years: 15.00    Types: Cigarettes    Quit date: 2001    Years since quitting: 22.0   Smokeless tobacco: Never  Vaping Use   Vaping Use: Never used  Substance and Sexual Activity   Alcohol use: No   Drug use: Yes    Types: Marijuana   Sexual activity: Never   Other Topics Concern   Not on file  Social History Narrative   Not on file   Social Determinants of Health   Financial Resource Strain: Not on file  Food Insecurity: No Food Insecurity   Worried About Charity fundraiser in the Last Year: Never true   Ran Out of Food in the Last Year: Never true  Transportation Needs: Unmet Transportation Needs   Lack of Transportation (Medical): Yes   Lack of Transportation (Non-Medical): Yes  Physical Activity: Not on file  Stress: Not on file  Social Connections: Socially Isolated   Frequency of Communication with Friends and Family: Twice a week   Frequency of Social Gatherings with Friends and Family: Once a week   Attends Religious Services: Never   Marine scientist or Organizations: No   Attends Archivist Meetings: Never   Marital Status: Widowed     Family History:  The patient's ***family history includes Alcoholism in her father; Breast cancer in her maternal aunt; Diabetes in her half-sister and mother; Glaucoma in her father; Heart disease in her paternal grandmother; Hypertension in her half-sister and mother; Obesity in her mother; Prostate cancer in her maternal grandfather; Renal cancer in her maternal uncle; Stroke in her mother.   ROS:   Please see the history of present illness.    ROS All other systems reviewed and are negative.   PHYSICAL EXAM:   VS:  There were no vitals taken for this visit.  Physical Exam  GEN: Well nourished, well developed, in no acute distress  HEENT: normal  Neck: no JVD, carotid bruits, or masses Cardiac:RRR; no murmurs, rubs, or gallops  Respiratory:  clear to auscultation bilaterally, normal work of breathing GI: soft, nontender, nondistended, + BS Ext: without cyanosis, clubbing, or edema, Good distal pulses bilaterally MS: no deformity or atrophy  Skin: warm and dry, no rash Neuro:  Alert and Oriented x 3, Strength and sensation are intact Psych: euthymic mood, full  affect  Wt Readings from Last 3 Encounters:  01/27/21 240 lb (108.9 kg)  12/22/20 279 lb 15.8 oz (127 kg)  12/13/20 280 lb (127 kg)      Studies/Labs Reviewed:   EKG:  EKG is*** ordered today.  The ekg ordered today demonstrates ***  Recent Labs: 03/08/2020: TSH 2.320 01/11/2021: Magnesium 2.0 01/27/2021: ALT 9; BUN 27; Creatinine 1.36; Potassium 2.9; Sodium 137 02/11/2021: Hemoglobin 7.8; Platelet Count 147   Lipid Panel    Component Value Date/Time   CHOL 228 (H) 03/08/2020 1001   TRIG 70 03/08/2020 1001   HDL 52 03/08/2020 1001   CHOLHDL 3.1 06/21/2019 1643   CHOLHDL 4.1 CALC 05/20/2006 1135   VLDL 13 05/20/2006 1135   LDLCALC 164 (H) 03/08/2020 1001    Additional studies/ records that were reviewed today include:  ***   Risk Assessment/Calculations:   {Does this patient have ATRIAL FIBRILLATION?:[phone]}     ASSESSMENT:    No diagnosis found.   PLAN:  In order of  problems listed above:  Hypertension  DM2  OSA on CPAP  Family history of CAD  Obesity  Shared Decision Making/Informed Consent   {Are you ordering a CV Procedure (e.g. stress test, cath, DCCV, TEE, etc)?   Press F2        :[id number]}    Medication Adjustments/Labs and Tests Ordered: Current medicines are reviewed at length with the patient today.  Concerns regarding medicines are outlined above.  Medication changes, Labs and Tests ordered today are listed in the Patient Instructions below. There are no Patient Instructions on file for this visit.   Sumner Boast, PA-C  02/11/2021 3:04 PM    [facility] [address], [place], [place]  [address] Phone: [phone]; Fax: [phone]

## 2021-02-12 ENCOUNTER — Other Ambulatory Visit: Payer: Self-pay | Admitting: Oncology

## 2021-02-12 ENCOUNTER — Inpatient Hospital Stay: Payer: 59

## 2021-02-12 ENCOUNTER — Encounter: Payer: Self-pay | Admitting: Oncology

## 2021-02-12 ENCOUNTER — Inpatient Hospital Stay: Payer: 59 | Admitting: Nutrition

## 2021-02-12 ENCOUNTER — Other Ambulatory Visit (HOSPITAL_COMMUNITY): Payer: Self-pay

## 2021-02-12 VITALS — BP 103/67 | HR 107 | Temp 98.7°F | Resp 18

## 2021-02-12 DIAGNOSIS — Z5111 Encounter for antineoplastic chemotherapy: Secondary | ICD-10-CM | POA: Diagnosis not present

## 2021-02-12 DIAGNOSIS — C799 Secondary malignant neoplasm of unspecified site: Secondary | ICD-10-CM

## 2021-02-12 DIAGNOSIS — C801 Malignant (primary) neoplasm, unspecified: Secondary | ICD-10-CM

## 2021-02-12 DIAGNOSIS — R1030 Lower abdominal pain, unspecified: Secondary | ICD-10-CM

## 2021-02-12 MED ORDER — MAGNESIUM SULFATE 4 GM/100ML IV SOLN
4.0000 g | Freq: Once | INTRAVENOUS | Status: AC
  Administered 2021-02-12: 4 g via INTRAVENOUS
  Filled 2021-02-12: qty 100

## 2021-02-12 MED ORDER — POTASSIUM CHLORIDE IN NACL 20-0.9 MEQ/L-% IV SOLN
Freq: Once | INTRAVENOUS | Status: AC
  Filled 2021-02-12: qty 1000

## 2021-02-12 MED ORDER — SODIUM CHLORIDE 0.9% FLUSH
10.0000 mL | Freq: Once | INTRAVENOUS | Status: AC
  Administered 2021-02-12: 10 mL via INTRAVENOUS

## 2021-02-12 MED ORDER — OXYCODONE HCL 5 MG PO TABS
5.0000 mg | ORAL_TABLET | ORAL | 0 refills | Status: DC | PRN
  Filled 2021-02-12: qty 60, 5d supply, fill #0

## 2021-02-12 MED ORDER — HEPARIN SOD (PORK) LOCK FLUSH 100 UNIT/ML IV SOLN
500.0000 [IU] | Freq: Once | INTRAVENOUS | Status: AC
  Administered 2021-02-12: 500 [IU] via INTRAVENOUS

## 2021-02-12 MED ORDER — SODIUM CHLORIDE 0.9 % IV SOLN: Freq: Once | INTRAVENOUS | Status: AC

## 2021-02-12 NOTE — Progress Notes (Signed)
56 year old female diagnosed with cancer of unknown origin, likely Gastrointestinal.  She has received 3 cycles of FOLFOX and is followed by Dr. Benay Spice. Chemo held today.  PMH includes PCM, DM, HTN, Anxiety, GERD, HLD, and Vitamin D deficiency  Medications include Imodium, Glucophage, Protonix, Mira lax and Senakot S.  Labs include Mag 1.4, K 2.6, Glucose 305, BUN 24, Creatinine 1.24, and Alb 3.3 on Jan 3.  Height: 5'4". Weight: 229 pounds on Jan 4. UBW: 294 pounds in October. BMI: 39.31.  Patient is not receiving chemotherapy today. She is getting IVF with magnesium and Potassium. She reports she does not feel great. She states she has no appetite. When she has tried to eat, she has vomiting. She has ongoing nausea and was prescribed nausea medications however, it was not ready when she tried to pick up yesterday. She has been constipated but states it all "broke loose" on Sunday. Since then, she has not had a bowel movement.  Patient has lost 22% of her body weight over 3 months which is severe. NFPE was deferred today.  Nutrition Diagnosis: Unintended wt loss related to GI cancer as evidenced by 22% wt loss in 3 months.  Intervention: Educated patient on soft, bland diet for nausea and vomiting. Encouraged small meals and snacks throughout the day. Educated on tips to increase appetite once nausea controlled.  Encouraged increased fluid intake. Minimize simple CHO for better glycemic control. Nutrition fact sheets provided. Questions answered. Contact information provided.  Monitoring, Evaluation, Goals: Patient will tolerate increased calories and protein to minimize wt loss and achieve glycemic control.  Next Visit:Telephone follow up on Wednesday, Jan 18.

## 2021-02-12 NOTE — Progress Notes (Signed)
Per Dr Benay Spice, no treatment today, give1 Liter NS with 9meq kcl, also 4 grams magnesium sulfate.

## 2021-02-12 NOTE — Patient Instructions (Signed)
[facility]   Discharge Instructions: Thank you for choosing [facility] to provide your oncology and hematology care.   If you have a lab appointment with the [facility], please go directly to the [facility] and check in at the registration area.   Wear comfortable clothing and clothing appropriate for easy access to any Portacath or PICC line.   We strive to give you quality time with your provider. You may need to reschedule your appointment if you arrive late (15 or more minutes).  Arriving late affects you and other patients whose appointments are after yours.  Also, if you miss three or more appointments without notifying the office, you may be dismissed from the clinic at the providers discretion.      For prescription refill requests, have your pharmacy contact our office and allow 72 hours for refills to be completed.    Today you received the following: IV fluids with Potassium and Magnesium   To help prevent nausea and vomiting after your treatment, we encourage you to take your nausea medication as directed.  BELOW ARE SYMPTOMS THAT SHOULD BE REPORTED IMMEDIATELY: *FEVER GREATER THAN 100.4 F (38 C) OR HIGHER *CHILLS OR SWEATING *NAUSEA AND VOMITING THAT IS NOT CONTROLLED WITH YOUR NAUSEA MEDICATION *UNUSUAL SHORTNESS OF BREATH *UNUSUAL BRUISING OR BLEEDING *URINARY PROBLEMS (pain or burning when urinating, or frequent urination) *BOWEL PROBLEMS (unusual diarrhea, constipation, pain near the anus) TENDERNESS IN MOUTH AND THROAT WITH OR WITHOUT PRESENCE OF ULCERS (sore throat, sores in mouth, or a toothache) UNUSUAL RASH, SWELLING OR PAIN  UNUSUAL VAGINAL DISCHARGE OR ITCHING   Items with * indicate a potential emergency and should be followed up as soon as possible or go to the Emergency Department if any problems should occur.  Please show the CHEMOTHERAPY ALERT CARD or IMMUNOTHERAPY ALERT CARD at check-in to the Emergency  Department and triage nurse.  Should you have questions after your visit or need to cancel or reschedule your appointment, please contact [facility]  Dept: [phone]  and follow the prompts.  Office hours are 8:00 a.m. to 4:30 p.m. Monday - Friday. Please note that voicemails left after 4:00 p.m. may not be returned until the following business day.  We are closed weekends and major holidays. You have access to a nurse at all times for urgent questions. Please call the main number to the clinic Dept: [phone] and follow the prompts.   For any non-urgent questions, you may also contact your provider using MyChart. We now offer e-Visits for anyone 28 and older to request care online for non-urgent symptoms. For details visit [email or web address].   Also download the MyChart app! Go to the app store, search "MyChart", open the app, select [facility], and log in with your MyChart username and password.  Due to Covid, a mask is required upon entering the hospital/clinic. If you do not have a mask, one will be given to you upon arrival. For doctor visits, patients may have 1 support person aged 31 or older with them. For treatment visits, patients cannot have anyone with them due to current Covid guidelines and our immunocompromised population.   Rehydration, Adult Rehydration is the replacement of body fluids, salts, and minerals (electrolytes) that are lost during dehydration. Dehydration is when there is not enough water or other fluids in the body. This happens when you lose more fluids than you take in. Common causes of dehydration include: Not drinking  enough fluids. This can occur when you are ill or doing activities that require a lot of energy, especially in hot weather. Conditions that cause loss of water or other fluids, such as diarrhea, vomiting, sweating, or urinating a lot. Other illnesses, such as fever or infection. Certain medicines, such as those  that remove excess fluid from the body (diuretics). Symptoms of mild or moderate dehydration may include thirst, dry lips and mouth, and dizziness. Symptoms of severe dehydration may include increased heart rate, confusion, fainting, and not urinating. For severe dehydration, you may need to get fluids through an IV at the hospital. For mild or moderate dehydration, you can usually rehydrate at home by drinking certain fluids as told by your health care provider. What are the risks? Generally, rehydration is safe. However, taking in too much fluid (overhydration) can be a problem. This is rare. Overhydration can cause an electrolyte imbalance, kidney failure, or a decrease in salt (sodium) levels in the body. Supplies needed You will need an oral rehydration solution (ORS) if your health care provider tells you to use one. This is a drink to treat dehydration. It can be found in pharmacies and retail stores. How to rehydrate Fluids Follow instructions from your health care provider for rehydration. The kind of fluid and the amount you should drink depend on your condition. In general, you should choose drinks that you prefer. If told by your health care provider, drink an ORS. Make an ORS by following instructions on the package. Start by drinking small amounts, about  cup (120 mL) every 5-10 minutes. Slowly increase how much you drink until you have taken the amount recommended by your health care provider. Drink enough clear fluids to keep your urine pale yellow. If you were told to drink an ORS, finish it first, then start slowly drinking other clear fluids. Drink fluids such as: Water. This includes sparkling water and flavored water. Drinking only water can lead to having too little sodium in your body (hyponatremia). Follow the advice of your health care provider. Water from ice chips you suck on. Fruit juice with water you add to it (diluted). Sports drinks. Hot or cold herbal  teas. Broth-based soups. Milk or milk products. Food Follow instructions from your health care provider about what to eat while you rehydrate. Your health care provider may recommend that you slowly begin eating regular foods in small amounts. Eat foods that contain a healthy balance of electrolytes, such as bananas, oranges, potatoes, tomatoes, and spinach. Avoid foods that are greasy or contain a lot of sugar. In some cases, you may get nutrition through a feeding tube that is passed through your nose and into your stomach (nasogastric tube, or NG tube). This may be done if you have uncontrolled vomiting or diarrhea. Beverages to avoid Certain beverages may make dehydration worse. While you rehydrate, avoid drinking alcohol. How to tell if you are recovering from dehydration You may be recovering from dehydration if: You are urinating more often than before you started rehydrating. Your urine is pale yellow. Your energy level improves. You vomit less frequently. You have diarrhea less frequently. Your appetite improves or returns to normal. You feel less dizzy or less light-headed. Your skin tone and color start to look more normal. Follow these instructions at home: Take over-the-counter and prescription medicines only as told by your health care provider. Do not take sodium tablets. Doing this can lead to having too much sodium in your body (hypernatremia). Contact a health care  provider if: You continue to have symptoms of mild or moderate dehydration, such as: Thirst. Dry lips. Slightly dry mouth. Dizziness. Dark urine or less urine than normal. Muscle cramps. You continue to vomit or have diarrhea. Get help right away if you: Have symptoms of dehydration that get worse. Have a fever. Have a severe headache. Have been vomiting and the following happens: Your vomiting gets worse or does not go away. Your vomit includes blood or green matter (bile). You cannot eat or drink  without vomiting. Have problems with urination or bowel movements, such as: Diarrhea that gets worse or does not go away. Blood in your stool (feces). This may cause stool to look black and tarry. Not urinating, or urinating only a small amount of very dark urine, within 6-8 hours. Have trouble breathing. Have symptoms that get worse with treatment. These symptoms may represent a serious problem that is an emergency. Do not wait to see if the symptoms will go away. Get medical help right away. Call your local emergency services (911 in the [place] Do not drive yourself to the hospital. Summary Rehydration is the replacement of body fluids and minerals (electrolytes) that are lost during dehydration. Follow instructions from your health care provider for rehydration. The kind of fluid and amount you should drink depend on your condition. Slowly increase how much you drink until you have taken the amount recommended by your health care provider. Contact your health care provider if you continue to show signs of mild or moderate dehydration. This information is not intended to replace advice given to you by your health care provider. Make sure you discuss any questions you have with your health care provider. Document Revised: 03/29/2019 Document Reviewed: 02/06/2019 Elsevier Patient Education  2022 [place].  Hypokalemia Hypokalemia means that the amount of potassium in the blood is lower than normal. Potassium is a chemical (electrolyte) that helps regulate the amount of fluid in the body. It also stimulates muscle tightening (contraction) and helps nerves work properly. Normally, most of the body's potassium is inside cells, and only a very small amount is in the blood. Because the amount in the blood is so small, minor changes to potassium levels in the blood can be life-threatening. What are the causes? This condition may be caused by: Antibiotic medicine. Diarrhea or vomiting. Taking  too much of a medicine that helps you have a bowel movement (laxative) can cause diarrhea and lead to hypokalemia. Chronic kidney disease (CKD). Medicines that help the body get rid of excess fluid (diuretics). Eating disorders, such as bulimia. Low magnesium levels in the body. Sweating a lot. What are the signs or symptoms? Symptoms of this condition include: Weakness. Constipation. Fatigue. Muscle cramps. Mental confusion. Skipped heartbeats or irregular heartbeat (palpitations). Tingling or numbness. How is this diagnosed? This condition is diagnosed with a blood test. How is this treated? This condition may be treated by: Taking potassium supplements by mouth. Adjusting the medicines that you take. Eating more foods that contain a lot of potassium. If your potassium level is very low, you may need to get potassium through an IV and be monitored in the hospital. Follow these instructions at home:  Take over-the-counter and prescription medicines only as told by your health care provider. This includes vitamins and supplements. Eat a healthy diet. A healthy diet includes fresh fruits and vegetables, whole grains, healthy fats, and lean proteins. If instructed, eat more foods that contain a lot of potassium. This includes: Nuts, such as  peanuts and pistachios. Seeds, such as sunflower seeds and pumpkin seeds. Peas, lentils, and lima beans. Whole grain and bran cereals and breads. Fresh fruits and vegetables, such as apricots, avocado, bananas, cantaloupe, kiwi, oranges, tomatoes, asparagus, and potatoes. Orange juice. Tomato juice. Red meats. Yogurt. Keep all follow-up visits as told by your health care provider. This is important. Contact a health care provider if you: Have weakness that gets worse. Feel your heart pounding or racing. Vomit. Have diarrhea. Have diabetes (diabetes mellitus) and you have trouble keeping your blood sugar (glucose) in your target range. Get  help right away if you: Have chest pain. Have shortness of breath. Have vomiting or diarrhea that lasts for more than 2 days. Faint. Summary Hypokalemia means that the amount of potassium in the blood is lower than normal. This condition is diagnosed with a blood test. Hypokalemia may be treated by taking potassium supplements, adjusting the medicines that you take, or eating more foods that are high in potassium. If your potassium level is very low, you may need to get potassium through an IV and be monitored in the hospital. This information is not intended to replace advice given to you by your health care provider. Make sure you discuss any questions you have with your health care provider. Document Revised: 09/07/2017 Document Reviewed: 09/08/2017 Elsevier Patient Education  2022 [place].  Hypomagnesemia Hypomagnesemia is a condition in which the level of magnesium in the blood is too low. Magnesium is a mineral that is found in many foods. It is used in many different processes in the body. Hypomagnesemia can affect every organ in the body. In severe cases, it can cause life-threatening problems. What are the causes? This condition may be caused by: Not getting enough magnesium in your diet or not having enough healthy foods to eat (malnutrition). Problems with magnesium absorption in the intestines. Dehydration. Excessive use of alcohol. Vomiting. Severe or long-term (chronic) diarrhea. Some medicines, including medicines that make you urinate more often (diuretics). Certain diseases, such as kidney disease, diabetes, celiac disease, and overactive thyroid. What are the signs or symptoms? Symptoms of this condition include: Loss of appetite, nausea, and vomiting. Involuntary shaking or trembling of a body part (tremor). Muscle weakness or tingling in the arms and legs. Sudden tightening of muscles (muscle spasms). Confusion. Psychiatric issues, such as: Depression and  irritability. Psychosis. A feeling of fluttering of the heart (palpitations). Seizures. These symptoms are more severe if magnesium levels drop suddenly. How is this diagnosed? This condition may be diagnosed based on: Your symptoms and medical history. A physical exam. Blood and urine tests. How is this treated? Treatment depends on the cause and the severity of the condition. It may be treated by: Taking a magnesium supplement. This can be taken in pill form. If the condition is severe, magnesium is usually given through an IV. Making changes to your diet. You may be directed to eat foods that have a lot of magnesium, such as green leafy vegetables, peas, beans, and nuts. Not drinking alcohol. If you are struggling not to drink, ask your health care provider for help. Follow these instructions at home: Eating and drinking   Make sure that your diet includes foods with magnesium. Foods that have a lot of magnesium in them include: Green leafy vegetables, such as spinach and broccoli. Beans and peas. Nuts and seeds, such as almonds and sunflower seeds. Whole grains, such as whole grain bread and fortified cereals. Drink fluids that contain salts and  minerals (electrolytes), such as sports drinks, when you are active. Do not drink alcohol. General instructions Take over-the-counter and prescription medicines only as told by your health care provider. Take magnesium supplements as directed if your health care provider tells you to take them. Have your magnesium levels monitored as told by your health care provider. Keep all follow-up visits. This is important. Contact a health care provider if: You get worse instead of better. Your symptoms return. Get help right away if: You develop severe muscle weakness. You have trouble breathing. You feel that your heart is racing. These symptoms may represent a serious problem that is an emergency. Do not wait to see if the symptoms will go  away. Get medical help right away. Call your local emergency services (911 in the [place] Do not drive yourself to the hospital. Summary Hypomagnesemia is a condition in which the level of magnesium in the blood is too low. Hypomagnesemia can affect every organ in the body. Treatment may include eating more foods that contain magnesium, taking magnesium supplements, and not drinking alcohol. Have your magnesium levels monitored as told by your health care provider. This information is not intended to replace advice given to you by your health care provider. Make sure you discuss any questions you have with your health care provider. Document Revised: 06/25/2020 Document Reviewed: 06/25/2020 Elsevier Patient Education  [place].

## 2021-02-13 ENCOUNTER — Other Ambulatory Visit (HOSPITAL_COMMUNITY): Payer: Self-pay

## 2021-02-13 ENCOUNTER — Inpatient Hospital Stay: Payer: 59 | Admitting: General Practice

## 2021-02-13 DIAGNOSIS — C169 Malignant neoplasm of stomach, unspecified: Secondary | ICD-10-CM

## 2021-02-13 NOTE — Progress Notes (Signed)
[facility] CSW Progress Notes  Call to patient per referral from RN concerned about patient's coping.  "I took some meds, I am about out of it."  She requests a call back tomorrow at 11 AM, CSW will call then.  Edwyna Shell, LCSW Clinical Social Worker Phone:  [phone]

## 2021-02-14 ENCOUNTER — Inpatient Hospital Stay: Payer: 59 | Admitting: General Practice

## 2021-02-14 ENCOUNTER — Other Ambulatory Visit: Payer: Self-pay

## 2021-02-14 ENCOUNTER — Inpatient Hospital Stay: Payer: 59

## 2021-02-14 DIAGNOSIS — C169 Malignant neoplasm of stomach, unspecified: Secondary | ICD-10-CM

## 2021-02-14 NOTE — Progress Notes (Signed)
[facility] CSW Progress Notes  Call to patient for support.  She is struggling with physical symptoms (vomiting, diarrhea, no appetite) related to her cancer diagnosis and treatment.  She does need to take pain medications, but tries not to take these often during the day as they make her quite sleepy.  Daytime naps interfere w her nighttime sleep.  She does not want to "sleep away my days."  She is isolated in her house, unable to leave much of the time.  Her niece from [place] comes once/twice a week and helps her run errands, get medications and similar.  She appreciates this contact.  She is also getting some support from an old friend who is able to help periodically.    Verified that [facility] has not been able to reach patient to begin process of disability application assistance.  Called patient, gave her [facility] contact information and asked her to call ASAP to schedule required initial phone interview.    Also spoke w niece [name] who is her main support.  Niece states she is doing "a little better with eating", ate more yesterday than previously.  Niece tries to get to [facility] at least twice/week, affirmed importance of her support to patient.  Gave her contact information for [facility] also so she can help patient connect.  Will also mail patient information on [facility] and [facility] as way to encourage further positive connections/support.  Edwyna Shell, LCSW Clinical Social Worker Phone:  [phone]

## 2021-02-18 ENCOUNTER — Ambulatory Visit: Payer: 59 | Admitting: Physician Assistant

## 2021-02-18 ENCOUNTER — Other Ambulatory Visit: Payer: Self-pay | Admitting: *Deleted

## 2021-02-18 DIAGNOSIS — C169 Malignant neoplasm of stomach, unspecified: Secondary | ICD-10-CM

## 2021-02-19 ENCOUNTER — Inpatient Hospital Stay: Payer: 59 | Admitting: Nurse Practitioner

## 2021-02-19 ENCOUNTER — Telehealth: Payer: Self-pay

## 2021-02-19 ENCOUNTER — Inpatient Hospital Stay: Payer: 59

## 2021-02-19 NOTE — Telephone Encounter (Signed)
Patient called to speak with the nurse because she had being nausea, vomiting, and having diarrhea for the  last past three weeks  off and on. Patient states she is weak. She tried to eat breakfast and she vomit everything back up. She is not taking in fluid because she is also vomit fluid back up. On 02/18/21 she had a half bowel of fruit other than anything else she have not being eating or drinking. She take her Zofran and Compazine. She states she do not have a fever, no pain, or no rash. Per to Dr Benay Spice she need to come in for her visit or go to the emergency room. Patient states she is too weak to do anything, and she will wait to see how she is feeling this evening.  Noted patient had an appointment schedule at 215 and transport do not text her to confirmed pick up time.

## 2021-02-20 ENCOUNTER — Other Ambulatory Visit: Payer: Self-pay

## 2021-02-20 ENCOUNTER — Inpatient Hospital Stay: Payer: 59

## 2021-02-20 ENCOUNTER — Inpatient Hospital Stay (HOSPITAL_COMMUNITY): Payer: 59

## 2021-02-20 ENCOUNTER — Emergency Department (HOSPITAL_COMMUNITY): Payer: 59

## 2021-02-20 ENCOUNTER — Encounter: Payer: Self-pay | Admitting: *Deleted

## 2021-02-20 ENCOUNTER — Inpatient Hospital Stay (HOSPITAL_COMMUNITY)
Admission: EM | Admit: 2021-02-20 | Discharge: 2021-03-13 | DRG: 811 | Disposition: A | Discharge status: Skilled Nursing Facility | Payer: 59 | Attending: Family Medicine | Admitting: Family Medicine

## 2021-02-20 ENCOUNTER — Encounter (HOSPITAL_COMMUNITY): Payer: Self-pay

## 2021-02-20 ENCOUNTER — Encounter: Payer: Self-pay | Admitting: Oncology

## 2021-02-20 ENCOUNTER — Encounter: Payer: Self-pay | Admitting: Nurse Practitioner

## 2021-02-20 ENCOUNTER — Telehealth: Payer: Self-pay

## 2021-02-20 ENCOUNTER — Inpatient Hospital Stay (HOSPITAL_BASED_OUTPATIENT_CLINIC_OR_DEPARTMENT_OTHER): Payer: 59 | Admitting: Nurse Practitioner

## 2021-02-20 VITALS — BP 109/78 | HR 130 | Temp 98.5°F | Resp 20

## 2021-02-20 DIAGNOSIS — C7800 Secondary malignant neoplasm of unspecified lung: Secondary | ICD-10-CM | POA: Diagnosis present

## 2021-02-20 DIAGNOSIS — K219 Gastro-esophageal reflux disease without esophagitis: Secondary | ICD-10-CM | POA: Diagnosis present

## 2021-02-20 DIAGNOSIS — C7972 Secondary malignant neoplasm of left adrenal gland: Secondary | ICD-10-CM | POA: Diagnosis present

## 2021-02-20 DIAGNOSIS — E785 Hyperlipidemia, unspecified: Secondary | ICD-10-CM | POA: Diagnosis present

## 2021-02-20 DIAGNOSIS — R509 Fever, unspecified: Secondary | ICD-10-CM | POA: Diagnosis not present

## 2021-02-20 DIAGNOSIS — C3490 Malignant neoplasm of unspecified part of unspecified bronchus or lung: Secondary | ICD-10-CM | POA: Diagnosis present

## 2021-02-20 DIAGNOSIS — T451X5A Adverse effect of antineoplastic and immunosuppressive drugs, initial encounter: Secondary | ICD-10-CM | POA: Diagnosis present

## 2021-02-20 DIAGNOSIS — R6520 Severe sepsis without septic shock: Secondary | ICD-10-CM | POA: Diagnosis not present

## 2021-02-20 DIAGNOSIS — T402X5A Adverse effect of other opioids, initial encounter: Secondary | ICD-10-CM | POA: Diagnosis not present

## 2021-02-20 DIAGNOSIS — E559 Vitamin D deficiency, unspecified: Secondary | ICD-10-CM | POA: Diagnosis present

## 2021-02-20 DIAGNOSIS — R059 Cough, unspecified: Secondary | ICD-10-CM

## 2021-02-20 DIAGNOSIS — F32A Depression, unspecified: Secondary | ICD-10-CM | POA: Diagnosis present

## 2021-02-20 DIAGNOSIS — Z83511 Family history of glaucoma: Secondary | ICD-10-CM

## 2021-02-20 DIAGNOSIS — C19 Malignant neoplasm of rectosigmoid junction: Secondary | ICD-10-CM | POA: Diagnosis present

## 2021-02-20 DIAGNOSIS — I959 Hypotension, unspecified: Secondary | ICD-10-CM

## 2021-02-20 DIAGNOSIS — D649 Anemia, unspecified: Secondary | ICD-10-CM | POA: Diagnosis not present

## 2021-02-20 DIAGNOSIS — G893 Neoplasm related pain (acute) (chronic): Secondary | ICD-10-CM | POA: Diagnosis present

## 2021-02-20 DIAGNOSIS — Z888 Allergy status to other drugs, medicaments and biological substances status: Secondary | ICD-10-CM

## 2021-02-20 DIAGNOSIS — I10 Essential (primary) hypertension: Secondary | ICD-10-CM | POA: Diagnosis present

## 2021-02-20 DIAGNOSIS — G4733 Obstructive sleep apnea (adult) (pediatric): Secondary | ICD-10-CM | POA: Diagnosis not present

## 2021-02-20 DIAGNOSIS — A4181 Sepsis due to Enterococcus: Secondary | ICD-10-CM | POA: Diagnosis present

## 2021-02-20 DIAGNOSIS — F419 Anxiety disorder, unspecified: Secondary | ICD-10-CM | POA: Diagnosis present

## 2021-02-20 DIAGNOSIS — E662 Morbid (severe) obesity with alveolar hypoventilation: Secondary | ICD-10-CM | POA: Diagnosis present

## 2021-02-20 DIAGNOSIS — A419 Sepsis, unspecified organism: Secondary | ICD-10-CM | POA: Diagnosis not present

## 2021-02-20 DIAGNOSIS — R112 Nausea with vomiting, unspecified: Secondary | ICD-10-CM | POA: Diagnosis not present

## 2021-02-20 DIAGNOSIS — B952 Enterococcus as the cause of diseases classified elsewhere: Secondary | ICD-10-CM

## 2021-02-20 DIAGNOSIS — C569 Malignant neoplasm of unspecified ovary: Secondary | ICD-10-CM | POA: Diagnosis present

## 2021-02-20 DIAGNOSIS — R400 Somnolence: Secondary | ICD-10-CM

## 2021-02-20 DIAGNOSIS — E119 Type 2 diabetes mellitus without complications: Secondary | ICD-10-CM | POA: Diagnosis present

## 2021-02-20 DIAGNOSIS — Z823 Family history of stroke: Secondary | ICD-10-CM

## 2021-02-20 DIAGNOSIS — Z20822 Contact with and (suspected) exposure to covid-19: Secondary | ICD-10-CM | POA: Diagnosis present

## 2021-02-20 DIAGNOSIS — D6959 Other secondary thrombocytopenia: Secondary | ICD-10-CM | POA: Diagnosis present

## 2021-02-20 DIAGNOSIS — C169 Malignant neoplasm of stomach, unspecified: Secondary | ICD-10-CM | POA: Diagnosis present

## 2021-02-20 DIAGNOSIS — Z85028 Personal history of other malignant neoplasm of stomach: Secondary | ICD-10-CM

## 2021-02-20 DIAGNOSIS — G9341 Metabolic encephalopathy: Secondary | ICD-10-CM

## 2021-02-20 DIAGNOSIS — Z8543 Personal history of malignant neoplasm of ovary: Secondary | ICD-10-CM

## 2021-02-20 DIAGNOSIS — E876 Hypokalemia: Secondary | ICD-10-CM | POA: Diagnosis present

## 2021-02-20 DIAGNOSIS — D72829 Elevated white blood cell count, unspecified: Secondary | ICD-10-CM

## 2021-02-20 DIAGNOSIS — D6481 Anemia due to antineoplastic chemotherapy: Principal | ICD-10-CM | POA: Diagnosis present

## 2021-02-20 DIAGNOSIS — R627 Adult failure to thrive: Secondary | ICD-10-CM | POA: Diagnosis present

## 2021-02-20 DIAGNOSIS — Z7189 Other specified counseling: Secondary | ICD-10-CM | POA: Diagnosis not present

## 2021-02-20 DIAGNOSIS — Z7984 Long term (current) use of oral hypoglycemic drugs: Secondary | ICD-10-CM

## 2021-02-20 DIAGNOSIS — D696 Thrombocytopenia, unspecified: Secondary | ICD-10-CM

## 2021-02-20 DIAGNOSIS — Z803 Family history of malignant neoplasm of breast: Secondary | ICD-10-CM

## 2021-02-20 DIAGNOSIS — Z515 Encounter for palliative care: Secondary | ICD-10-CM | POA: Diagnosis not present

## 2021-02-20 DIAGNOSIS — N1330 Unspecified hydronephrosis: Secondary | ICD-10-CM | POA: Diagnosis present

## 2021-02-20 DIAGNOSIS — Z6372 Alcoholism and drug addiction in family: Secondary | ICD-10-CM

## 2021-02-20 DIAGNOSIS — Z811 Family history of alcohol abuse and dependence: Secondary | ICD-10-CM

## 2021-02-20 DIAGNOSIS — R110 Nausea: Secondary | ICD-10-CM | POA: Diagnosis present

## 2021-02-20 DIAGNOSIS — Z87891 Personal history of nicotine dependence: Secondary | ICD-10-CM

## 2021-02-20 DIAGNOSIS — C799 Secondary malignant neoplasm of unspecified site: Secondary | ICD-10-CM | POA: Diagnosis not present

## 2021-02-20 DIAGNOSIS — Z8249 Family history of ischemic heart disease and other diseases of the circulatory system: Secondary | ICD-10-CM

## 2021-02-20 DIAGNOSIS — Z7901 Long term (current) use of anticoagulants: Secondary | ICD-10-CM

## 2021-02-20 DIAGNOSIS — R7881 Bacteremia: Secondary | ICD-10-CM | POA: Diagnosis not present

## 2021-02-20 DIAGNOSIS — R1030 Lower abdominal pain, unspecified: Secondary | ICD-10-CM

## 2021-02-20 DIAGNOSIS — E46 Unspecified protein-calorie malnutrition: Secondary | ICD-10-CM | POA: Diagnosis not present

## 2021-02-20 DIAGNOSIS — C7951 Secondary malignant neoplasm of bone: Secondary | ICD-10-CM | POA: Diagnosis present

## 2021-02-20 DIAGNOSIS — R600 Localized edema: Secondary | ICD-10-CM

## 2021-02-20 DIAGNOSIS — Z833 Family history of diabetes mellitus: Secondary | ICD-10-CM

## 2021-02-20 DIAGNOSIS — C801 Malignant (primary) neoplasm, unspecified: Secondary | ICD-10-CM | POA: Diagnosis present

## 2021-02-20 DIAGNOSIS — Z8051 Family history of malignant neoplasm of kidney: Secondary | ICD-10-CM

## 2021-02-20 DIAGNOSIS — E43 Unspecified severe protein-calorie malnutrition: Secondary | ICD-10-CM | POA: Insufficient documentation

## 2021-02-20 DIAGNOSIS — R609 Edema, unspecified: Secondary | ICD-10-CM | POA: Diagnosis not present

## 2021-02-20 DIAGNOSIS — Z79899 Other long term (current) drug therapy: Secondary | ICD-10-CM

## 2021-02-20 LAB — CBC WITH DIFFERENTIAL (CANCER CENTER ONLY)
Abs Immature Granulocytes: 0.19 10*3/uL — ABNORMAL HIGH (ref 0.00–0.07)
Basophils Absolute: 0 10*3/uL (ref 0.0–0.1)
Basophils Relative: 0 %
Eosinophils Absolute: 0 10*3/uL (ref 0.0–0.5)
Eosinophils Relative: 0 %
HCT: 19.5 % — ABNORMAL LOW (ref 36.0–46.0)
Hemoglobin: 6 g/dL — CL (ref 12.0–15.0)
Immature Granulocytes: 1 %
Lymphocytes Relative: 6 %
Lymphs Abs: 0.8 10*3/uL (ref 0.7–4.0)
MCH: 28.4 pg (ref 26.0–34.0)
MCHC: 30.8 g/dL (ref 30.0–36.0)
MCV: 92.4 fL (ref 80.0–100.0)
Monocytes Absolute: 0.6 10*3/uL (ref 0.1–1.0)
Monocytes Relative: 4 %
Neutro Abs: 11.7 10*3/uL — ABNORMAL HIGH (ref 1.7–7.7)
Neutrophils Relative %: 89 %
Platelet Count: 121 10*3/uL — ABNORMAL LOW (ref 150–400)
RBC: 2.11 MIL/uL — ABNORMAL LOW (ref 3.87–5.11)
RDW: 19.3 % — ABNORMAL HIGH (ref 11.5–15.5)
WBC Count: 13.3 10*3/uL — ABNORMAL HIGH (ref 4.0–10.5)
nRBC: 0.2 % (ref 0.0–0.2)

## 2021-02-20 LAB — CMP (CANCER CENTER ONLY)
ALT: <5 U/L (ref 0–44)
AST: 13 U/L — ABNORMAL LOW (ref 15–41)
Albumin: 2.6 g/dL — ABNORMAL LOW (ref 3.5–5.0)
Alkaline Phosphatase: 269 U/L — ABNORMAL HIGH (ref 38–126)
Anion gap: 13 (ref 5–15)
BUN: 16 mg/dL (ref 6–20)
CO2: 24 mmol/L (ref 22–32)
Calcium: 8.3 mg/dL — ABNORMAL LOW (ref 8.9–10.3)
Chloride: 108 mmol/L (ref 98–111)
Creatinine: 0.91 mg/dL (ref 0.44–1.00)
GFR, Estimated: >60 mL/min (ref >60)
Glucose, Bld: 195 mg/dL — ABNORMAL HIGH (ref 70–99)
Potassium: 2.7 mmol/L — CL (ref 3.5–5.1)
Sodium: 145 mmol/L (ref 135–145)
Total Bilirubin: 1.1 mg/dL (ref 0.3–1.2)
Total Protein: 6.1 g/dL — ABNORMAL LOW (ref 6.5–8.1)

## 2021-02-20 LAB — PREPARE RBC (CROSSMATCH)

## 2021-02-20 LAB — RESP PANEL BY RT-PCR (FLU A&B, COVID) ARPGX2
Influenza A by PCR: NEGATIVE
Influenza B by PCR: NEGATIVE
SARS Coronavirus 2 by RT PCR: NEGATIVE

## 2021-02-20 LAB — POC OCCULT BLOOD, ED: Fecal Occult Bld: NEGATIVE

## 2021-02-20 LAB — CBG MONITORING, ED: Glucose-Capillary: 93 mg/dL (ref 70–99)

## 2021-02-20 LAB — MAGNESIUM: Magnesium: 1.5 mg/dL — ABNORMAL LOW (ref 1.7–2.4)

## 2021-02-20 MED ORDER — POTASSIUM CHLORIDE 10 MEQ/100ML IV SOLN
10.0000 meq | INTRAVENOUS | Status: AC
  Administered 2021-02-21 (×2): 10 meq via INTRAVENOUS
  Filled 2021-02-20 (×2): qty 100

## 2021-02-20 MED ORDER — LACTATED RINGERS IV BOLUS
1000.0000 mL | Freq: Once | INTRAVENOUS | Status: AC
  Administered 2021-02-20: 1000 mL via INTRAVENOUS

## 2021-02-20 MED ORDER — POTASSIUM CHLORIDE 10 MEQ/100ML IV SOLN
10.0000 meq | Freq: Once | INTRAVENOUS | Status: AC
  Administered 2021-02-20: 10 meq via INTRAVENOUS
  Filled 2021-02-20: qty 100

## 2021-02-20 MED ORDER — ONDANSETRON HCL 4 MG/2ML IJ SOLN: 4.0000 mg | Freq: Four times a day (QID) | INTRAMUSCULAR | Status: DC | PRN

## 2021-02-20 MED ORDER — LACTATED RINGERS IV SOLN: INTRAVENOUS | Status: DC

## 2021-02-20 MED ORDER — INSULIN ASPART 100 UNIT/ML IJ SOLN
0.0000 [IU] | Freq: Every day | INTRAMUSCULAR | Status: DC
  Filled 2021-02-20: qty 0.05

## 2021-02-20 MED ORDER — MORPHINE SULFATE 2 MG/ML IJ SOLN
2.0000 mg | Freq: Once | INTRAMUSCULAR | Status: AC
  Administered 2021-02-20: 2 mg via INTRAVENOUS
  Filled 2021-02-20: qty 1

## 2021-02-20 MED ORDER — OXYCODONE HCL 5 MG PO TABS
5.0000 mg | ORAL_TABLET | ORAL | Status: DC | PRN
  Administered 2021-02-21 – 2021-02-22 (×4): 10 mg via ORAL
  Filled 2021-02-20 (×4): qty 2

## 2021-02-20 MED ORDER — MORPHINE SULFATE (PF) 4 MG/ML IV SOLN
6.0000 mg | Freq: Once | INTRAVENOUS | Status: AC
  Administered 2021-02-20: 6 mg via INTRAVENOUS
  Filled 2021-02-20: qty 2

## 2021-02-20 MED ORDER — LOPERAMIDE HCL 2 MG PO CAPS: 2.0000 mg | ORAL_CAPSULE | Freq: Four times a day (QID) | ORAL | Status: DC | PRN

## 2021-02-20 MED ORDER — AMLODIPINE BESYLATE 5 MG PO TABS
5.0000 mg | ORAL_TABLET | Freq: Every day | ORAL | Status: DC
  Administered 2021-02-21 – 2021-02-28 (×8): 5 mg via ORAL
  Filled 2021-02-20 (×9): qty 1

## 2021-02-20 MED ORDER — SODIUM CHLORIDE 0.9 % IV SOLN
10.0000 mL/h | Freq: Once | INTRAVENOUS | Status: AC
  Administered 2021-02-20: 10 mL/h via INTRAVENOUS

## 2021-02-20 MED ORDER — INSULIN ASPART 100 UNIT/ML IJ SOLN
0.0000 [IU] | Freq: Three times a day (TID) | INTRAMUSCULAR | Status: DC
  Administered 2021-02-21: 3 [IU] via SUBCUTANEOUS
  Administered 2021-02-22: 2 [IU] via SUBCUTANEOUS
  Administered 2021-02-23: 3 [IU] via SUBCUTANEOUS
  Administered 2021-02-24 – 2021-02-26 (×4): 2 [IU] via SUBCUTANEOUS
  Administered 2021-02-26: 5 [IU] via SUBCUTANEOUS
  Administered 2021-02-27 – 2021-02-28 (×4): 2 [IU] via SUBCUTANEOUS
  Administered 2021-03-07 – 2021-03-08 (×2): 3 [IU] via SUBCUTANEOUS
  Filled 2021-02-20: qty 0.15

## 2021-02-20 MED ORDER — ACETAMINOPHEN 325 MG PO TABS
650.0000 mg | ORAL_TABLET | Freq: Four times a day (QID) | ORAL | Status: DC | PRN
  Administered 2021-02-24 – 2021-03-06 (×8): 650 mg via ORAL
  Filled 2021-02-20 (×8): qty 2

## 2021-02-20 MED ORDER — ACETAMINOPHEN 650 MG RE SUPP
650.0000 mg | Freq: Four times a day (QID) | RECTAL | Status: DC | PRN
  Administered 2021-02-21: 650 mg via RECTAL
  Filled 2021-02-20: qty 1

## 2021-02-20 MED ORDER — POTASSIUM CHLORIDE 20 MEQ PO PACK
80.0000 meq | PACK | Freq: Once | ORAL | Status: DC
  Filled 2021-02-20: qty 4

## 2021-02-20 MED ORDER — ONDANSETRON HCL 4 MG PO TABS: 8.0000 mg | ORAL_TABLET | Freq: Three times a day (TID) | ORAL | Status: DC | PRN

## 2021-02-20 MED ORDER — SODIUM CHLORIDE 0.9 % IV SOLN: INTRAVENOUS | Status: DC

## 2021-02-20 MED ORDER — CARVEDILOL 6.25 MG PO TABS
6.2500 mg | ORAL_TABLET | Freq: Two times a day (BID) | ORAL | Status: DC
  Administered 2021-02-21 – 2021-02-28 (×16): 6.25 mg via ORAL
  Filled 2021-02-20 (×17): qty 1

## 2021-02-20 MED ORDER — PANTOPRAZOLE SODIUM 40 MG PO TBEC
40.0000 mg | DELAYED_RELEASE_TABLET | Freq: Every day | ORAL | Status: DC
  Administered 2021-02-21 – 2021-03-13 (×21): 40 mg via ORAL
  Filled 2021-02-20 (×21): qty 1

## 2021-02-20 MED ORDER — SODIUM CHLORIDE 0.9% FLUSH
3.0000 mL | Freq: Two times a day (BID) | INTRAVENOUS | Status: DC
  Administered 2021-02-20: 3 mL via INTRAVENOUS

## 2021-02-20 NOTE — Progress Notes (Signed)
CRITICAL VALUE STICKER  CRITICAL VALUE: K+ 2.7  RECEIVER (on-site recipient of call):Cornie Herrington,RN  DATE & TIME NOTIFIED: 02/20/21 @ 1547  MESSENGER (representative from lab): Tarry Kos  MD NOTIFIED: Ned Card, NP  TIME OF NOTIFICATION:1549  RESPONSE: Arranging [facility] transport to [facility]

## 2021-02-20 NOTE — Telephone Encounter (Signed)
TC to Pt to follow up with symptoms that she reported yesterday. Pt stated she still  has nausea and vomiting. Even with taking her antiemetics.Pt also stated she has had diarrhea that started the previous day. Pt denied fever and had an appointment with the office but stated she was too weak to come to her visit. Providers notified and recommendation was to go to the ER. Return call today to follow if Pt went to ER. No answer. Message left to return call to the office.

## 2021-02-20 NOTE — ED Provider Notes (Signed)
[facility] DEPT Provider Note   CSN: [id number] Arrival date & time: 02/20/21  1658     History  Chief Complaint  Patient presents with   Nausea    Erin [name] is a 56 y.o. female.  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  Patient describes her emesis as being clear.  She has had no fever but some diffuse crampy abdominal pain.  She also endorses watery diarrhea without evidence blood or dark stools.  Went to the cancer center today and was given IV hydration as well as had blood work done.  Patient found to have worsening anemia as well as hypokalemia.  Patient recommended for admission for work-up of possible gastric outlet obstruction as well as treatment of her anemia and hypokalemia      Home Medications Prior to Admission medications   Medication Sig Start Date End Date Taking? Authorizing Provider  amLODipine (NORVASC) 5 MG tablet TAKE 1 TABLET (5 MG TOTAL) BY MOUTH DAILY. Patient taking differently: Take 5 mg by mouth daily. 03/06/20 03/06/21  Fay Records, MD  carvedilol (COREG) 6.25 MG tablet TAKE 1 TABLET BY MOUTH 2 TIMES DAILY WITH A MEAL. Patient taking differently: Take 6.25 mg by mouth 2 (two) times daily with a meal. 03/06/20 03/06/21  Fay Records, MD  cyclobenzaprine (FLEXERIL) 5 MG tablet Take 1 tablet (5 mg total) by mouth 2 (two) times daily as needed for muscle spasms. 01/27/21   Owens Shark, NP  diclofenac Sodium (VOLTAREN) 1 % GEL Apply 2 g topically 4 (four) times daily as needed (msk pain). Patient not taking: Reported on 01/27/2021 01/14/21   Kayleen Memos, DO  gabapentin (NEURONTIN) 300 MG capsule Take 1 capsule (300 mg total) by mouth at bedtime. 01/14/21 02/13/21  Kayleen Memos, DO  liver oil-zinc oxide (DESITIN) 40 % ointment Apply topically as needed for irritation. 01/14/21   Kayleen Memos, DO  loperamide  (IMODIUM) 2 MG capsule Take 1 capsule (2 mg total) by mouth 4 (four) times daily as needed for diarrhea or loose stools. 01/15/21   Sherwood Gambler, MD  metFORMIN (GLUCOPHAGE) 500 MG tablet Take 1 tablet (500 mg total) by mouth daily. Patient taking differently: Take 500 mg by mouth daily. 07/10/20     ondansetron (ZOFRAN) 8 MG tablet Take 1 tablet (8 mg total) by mouth every 8 (eight) hours as needed for nausea or vomiting. 02/11/21   Ladell Pier, MD  oxyCODONE (OXY IR/ROXICODONE) 5 MG immediate release tablet Take 1-2 tablets (5-10 mg total) by mouth every 4 (four) hours as needed for severe pain. 02/12/21   Ladell Pier, MD  pantoprazole (PROTONIX) 40 MG tablet Take 1 tablet (40 mg total) by mouth daily. Patient not taking: Reported on 02/11/2021 01/14/21 04/14/21  Kayleen Memos, DO  polyethylene glycol (MIRALAX / GLYCOLAX) 17 g packet Take 17 g by mouth daily as needed. Patient not taking: Reported on 02/11/2021 01/14/21   Kayleen Memos, DO  potassium chloride (MICRO-K) 10 MEQ CR capsule Take 1 capsule (10 mEq total) by mouth 3 (three) times daily. 02/11/21   Ladell Pier, MD  potassium chloride SA (KLOR-CON M) 20 MEQ tablet Take 1 tablet twice daily for 3 days then 1 tablet daily Patient not taking: Reported on 02/11/2021 01/27/21 03/02/21  Owens Shark, NP  prochlorperazine (COMPAZINE) 10 MG tablet Take 1 tablet (10 mg total)  by mouth every 6 (six) hours as needed for nausea or vomiting. 02/11/21   Ladell Pier, MD      Allergies    Tramadol    Review of Systems   Review of Systems  All other systems reviewed and are negative.  Physical Exam Updated Vital Signs BP (!) 139/92 (BP Location: Right Arm)    Pulse (!) 109    Temp 98.6 F (37 C) (Oral)    Resp 16    Ht 1.626 m (5\' 4" )    Wt 99.8 kg    SpO2 97%    BMI 37.76 kg/m  Physical Exam Vitals and nursing note reviewed.  Constitutional:      General: She is not in acute distress.    Appearance: Normal appearance. She is  well-developed. She is not toxic-appearing.  HENT:     Head: Normocephalic and atraumatic.  Eyes:     General: Lids are normal.     Conjunctiva/sclera: Conjunctivae normal.     Pupils: Pupils are equal, round, and reactive to light.  Neck:     Thyroid: No thyroid mass.     Trachea: No tracheal deviation.  Cardiovascular:     Rate and Rhythm: Normal rate and regular rhythm.     Heart sounds: Normal heart sounds. No murmur heard.   No gallop.  Pulmonary:     Effort: Pulmonary effort is normal. No respiratory distress.     Breath sounds: Normal breath sounds. No stridor. No decreased breath sounds, wheezing, rhonchi or rales.  Abdominal:     General: There is no distension.     Palpations: Abdomen is soft.     Tenderness: There is generalized abdominal tenderness. There is no guarding or rebound.  Musculoskeletal:        General: No tenderness. Normal range of motion.     Cervical back: Normal range of motion and neck supple.  Skin:    General: Skin is warm and dry.     Findings: No abrasion or rash.  Neurological:     Mental Status: She is alert and oriented to person, place, and time. Mental status is at baseline.     GCS: GCS eye subscore is 4. GCS verbal subscore is 5. GCS motor subscore is 6.     Cranial Nerves: No cranial nerve deficit.     Sensory: No sensory deficit.     Motor: Motor function is intact.  Psychiatric:        Attention and Perception: Attention normal.        Speech: Speech normal.        Behavior: Behavior normal.    ED Results / Procedures / Treatments   Labs (all labs ordered are listed, but only abnormal results are displayed) Labs Reviewed  RESP PANEL BY RT-PCR (FLU A&B, COVID) ARPGX2  POC OCCULT BLOOD, ED  TYPE AND SCREEN    EKG None  Radiology No results found.  Procedures Procedures    Medications Ordered in ED Medications  lactated ringers infusion (has no administration in time range)  lactated ringers bolus 1,000 mL (has no  administration in time range)  potassium chloride 10 mEq in 100 mL IVPB (has no administration in time range)    ED Course/ Medical Decision Making/ A&P                           Medical Decision Making  Patient given IV fluids here.  Abdominal CT  showed no signs of obstruction.  She is guaiac negative from below therefore no severe ongoing loss of blood at this time.  1 unit of packed red blood cells ordered.  Also found be hypokalemic and given IV potassium.  All records reviewed extensively.  Will discuss with hospitalist for admission        Final Clinical Impression(s) / ED Diagnoses Final diagnoses:  None    Rx / DC Orders ED Discharge Orders     None         Lacretia Leigh, MD 02/20/21 1942

## 2021-02-20 NOTE — Progress Notes (Signed)
[facility] OFFICE PROGRESS NOTE   Diagnosis: Gastrointestinal cancer  INTERVAL HISTORY:   Erin [name] is seen in an unscheduled visit due to nausea and vomiting.  She completed a cycle of FOLFOX 01/27/2021.  Chemotherapy was held last week due to increased nausea, weight loss.  She received IV fluids and was started on potassium.  She was scheduled to return for an office visit yesterday.  She contacted the office prior to the visit to report continued nausea, vomiting, diarrhea, weakness.  We recommended she go to the emergency department.  We contacted her today for follow-up at which time she reported continued nausea/vomiting.  She is in the office now receiving IV fluids.  She reports continued nausea/vomiting within minutes of any oral intake, including water and medication.  After she vomits she is able to identify the intake, including medications.  No fever.  Continued abdominal pain.  She feels very weak.  She has had a few loose stools over the past 2 days.  Objective:  Vital signs in last 24 hours:  Blood pressure 109/78, pulse (!) 130, temperature 98.5 F (36.9 C), resp. rate 20.    HEENT: White coating over tongue.  Tongue appears dry. Resp: Lungs clear bilaterally. Cardio: Regular rate and rhythm. GI: Firm fullness left lower abdomen.  Abdomen in general is tender. Vascular: No leg edema. Skin: Skin has a dry appearance. Port-A-Cath without erythema.  Lab Results:  Lab Results  Component Value Date   WBC 13.3 (H) 02/20/2021   HGB 6.0 (LL) 02/20/2021   HCT 19.5 (L) 02/20/2021   MCV 92.4 02/20/2021   PLT 121 (L) 02/20/2021   NEUTROABS 11.7 (H) 02/20/2021    Imaging:  No results found.  Medications: I have reviewed the patient's current medications.  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 -Bladder neck, urethra, cervical biopsies 12/17/2020 -MSS, tumor mutation burden 4, K-ras  amplification, PD-L1 tumor proportion score-0 -[facility] -90% intestinal malignancy (colorectal adenocarcinoma 85%) small intestine adenocarcinoma less than 5%, gastroesophageal adenocarcinoma not excluded -12/05/2020 MRI of the pelvis-poorly marginated enhancing 3.7 x 2.8 x 3.0 cm mass centered at the urethra, solid avidly enhancing bilateral ovarian masses, poorly marginated enhancing 2.4 x 2.3 x 3.0 cm mass in the left uterine cervix, mild to moderate bilateral common iliac, bilateral external iliac, and bilateral inguinal lymphadenopathy, diffuse patchy confluent nodular replacement of the pelvic osseous structures. -12/13/2020 CEA 1864, CA125 29.2 -12/16/2020 CT abdomen/pelvis-rapidly increasing size of the left ovary with some adjacent ascites, enlarging masses elsewhere, masslike area of the cervix and potentially within a urethral diverticulum, well-circumscribed left adrenal lesion measuring 3.2 x 2.6 cm, heterogeneous pattern of subtle sclerosis and lucency in the spine. -12/16/2020 MRI of the lumbar spine-diffusely abnormal appearance of the bone marrow throughout the visualized lumbar spine and pelvis highly suspicious for diffuse osseous metastatic disease, retroperitoneal/iliac adenopathy with left larger than right adnexal masses. -12/17/2020 CT chest-no acute intrathoracic pathology -Upper endoscopy 12/22/2020-gastritis, gastric nodule biopsy-adenocarcinoma, poorly differentiated with signet ring morphology -Colonoscopy 12/22/2020-polyps removed from the ascending and sigmoid colon, extrinsic compression of the sigmoid colon-tubulovillous adenoma without high-grade dysplasia, tubular adenoma and hyperplastic polyps -12/27/2020 cycle #1 FOLFOX -01/10/2021 cycle #2 FOLFOX -01/27/2021 cycle 3 FOLFOX, oxaliplatin dose reduced 2.  Abdominal pain secondary #1 3.  Constipation 4.  Normocytic anemia 5.  Leukocytosis 6.  Protein calorie malnutrition 7.  Obstructive sleep apnea 8.  Diabetes  mellitus 9.  Hypertension 10.  Possible pulmonary hypertension noted on CT chest  Disposition: Erin [name] has metastatic adenocarcinoma, likely GI origin.  She has completed 3 cycles of FOLFOX, most recent cycle given 01/27/2021.  She is having significant nausea/vomiting, unable to maintain adequate hydration.  We are concerned for possible gastric outlet obstruction.  She has progressive anemia, hemoglobin 6.  She is tachycardic.  Dr. Benay Spice spoke with the hospitalist at [facility].  We are making arrangements for transfer to the [facility] emergency department.  Patient seen with Dr. Benay Spice.    Ned Card ANP/GNP-BC   02/20/2021  3:51 PM This was a shared visit with Ned Card.  Erin [name] was interviewed and examined.  She presents with intractable nausea and vomiting now greater than 3 weeks from the last cycle of chemotherapy.  I doubt her symptoms are related to chemotherapy.  We are suspicious of a bowel obstruction, potentially gastric outlet obstruction.  She has severe anemia secondary to chronic disease, chemotherapy, and potentially GU/GI bleeding.  I discussed the case with the hospitalist service.  She will be transferred to the emergency room for hospital admission.  I will check on her 02/21/2021.  I was present for greater than 50% of today's visit.  I performed medical decision making.  [name], MD

## 2021-02-20 NOTE — H&P (Addendum)
History and Physical   Erin [name] WCH:[id number] DOB: Jul 01, 1965 DOA: 02/20/2021  PCP: Pcp, No   Patient coming from: Oncologist office  Chief Complaint: Nausea vomiting diarrhea, abnormal labs  HPI: Erin [name] is a 56 y.o. female with medical history significant of poorly differentiated metastatic adenocarcinoma, OSA, OHS, gastric cancer, hypertension, diabetes who presents with nausea vomiting abdominal cramping and diarrhea with abnormal labs from oncologist office as above.  Patient presented to her oncologist visit today which was unscheduled due to her nausea and vomiting.  She is currently being treated for adenocarcinoma which is metastatic with FOLFOX.  Her last treatment was on December 19 and it was held recently due to her nausea and weight loss.  She had received some fluids and potassium previously due to low potassium at that time.  She presented today with concerns for continued nausea vomiting diarrhea weakness.  She had some IV fluids in the oncologist office and labs are drawn.  Noted to have significant anemia and hypokalemia and was referred to the ED for further evaluation.  She denies fevers, chills, chest pain, shortness of breath.  ED Course: Vital signs in the ED significant for heart rate in the 100s to 110s, respiratory rate in the teens to 20s, on 1 L of supplemental oxygen.  Lab work-up showed CMP with potassium of 2.7, glucose 195, calcium 8.3, protein 6.1, albumin 2.6, alk phos 269 which is stable.  CBC showed leukocytosis to 13.3 which is new, anemia of 6 with previous hemoglobin of 8 9 days ago and 9 3 weeks ago.  Platelets 121.  Normal MCV.  FOBT was negative.  Respiratory panel for flu and COVID was negative.  CT of the abdomen pelvis showed increased ovarian mass size, new bony mets, increased lymphadenopathy, increased left adrenal met, lung nodule suspicious for mets, mild right hydro nephrosis with no stone currently seen.  Patient received 1  L of IV fluids, 10 mEq of IV potassium and 1 unit of packed red blood cells has been ordered.  Review of Systems: As per HPI otherwise all other systems reviewed and are negative.  Past Medical History:  Diagnosis Date   Anxiety    Constipation    Depression    GERD (gastroesophageal reflux disease)    Pt states she no longer needs meds   Hyperlipidemia    Hypertension    Menorrhagia    OSA (obstructive sleep apnea) 11/25/2017   OSA on CPAP    Other fatigue    Shortness of breath on exertion    Type 2 diabetes mellitus ([place])    Vitamin D deficiency    Wears glasses     Past Surgical History:  Procedure Laterality Date   BIOPSY  12/22/2020   Procedure: BIOPSY;  Surgeon: Lavena Bullion, DO;  Location: [facility];  Service: Endoscopy;;   BREAST EXCISIONAL BIOPSY     COLONOSCOPY WITH PROPOFOL N/A 12/22/2020   Procedure: COLONOSCOPY WITH PROPOFOL;  Surgeon: Lavena Bullion, DO;  Location: [facility];  Service: Endoscopy;  Laterality: N/A;   CYSTOSCOPY N/A 12/17/2020   Procedure: CYSTOSCOPY with biopsies;  Surgeon: Robley Fries, MD;  Location: [facility];  Service: Urology;  Laterality: N/A;   DILITATION & CURRETTAGE/HYSTROSCOPY WITH NOVASURE ABLATION N/A 08/26/2015   Procedure: DILATATION & CURETTAGE/HYSTEROSCOPY WITH NOVASURE ABLATION;  Surgeon: [name], MD;  Location: [place];  Service: Gynecology;  Laterality: N/A;   ESOPHAGOGASTRODUODENOSCOPY (EGD) WITH PROPOFOL N/A 12/22/2020   Procedure: ESOPHAGOGASTRODUODENOSCOPY (  EGD) WITH PROPOFOL;  Surgeon: Lavena Bullion, DO;  Location: [facility];  Service: Endoscopy;  Laterality: N/A;   EXCISIONAL LEFT BREAST BX  11-14-2007   benign   IR IMAGING GUIDED PORT INSERTION  12/26/2020   LAPAROSCOPIC TUBAL LIGATION Bilateral 08/26/2015   Procedure: LAPAROSCOPIC TUBAL LIGATION;  Surgeon: [name], MD;  Location: [place];  Service: Gynecology;  Laterality: Bilateral;   POLYPECTOMY   12/22/2020   Procedure: POLYPECTOMY;  Surgeon: Lavena Bullion, DO;  Location: [facility];  Service: Endoscopy;;   SHOULDER ARTHROSCOPY WITH ROTATOR CUFF REPAIR AND SUBACROMIAL DECOMPRESSION Right 04/08/2020   Procedure: SHOULDER ARTHROSCOPY WITH ROTATOR CUFF REPAIR AND SUBACROMIAL DECOMPRESSION;  Surgeon: Tania Ade, MD;  Location: [facility];  Service: Orthopedics;  Laterality: Right;  NEED 90 MINUTES FOR THIS CASE   THYROID LOBECTOMY Right 04-30-2008    Social History  reports that she quit smoking about 22 years ago. Her smoking use included cigarettes. She has never used smokeless tobacco. She reports that she does not currently use drugs after having used the following drugs: Marijuana. She reports that she does not drink alcohol.  Allergies  Allergen Reactions   Tramadol Other (See Comments)    Per patient increased HR and sweating    Family History  Problem Relation Age of Onset   Stroke Mother    Diabetes Mother    Hypertension Mother    Obesity Mother    Glaucoma Father    Alcoholism Father    Breast cancer Maternal Aunt    Renal cancer Maternal Uncle    Prostate cancer Maternal Grandfather    Heart disease Paternal Grandmother        enlarged heart   Diabetes Half-Sister    Hypertension Half-Sister    Colon cancer Neg Hx    Esophageal cancer Neg Hx    Stomach cancer Neg Hx    Colon polyps Neg Hx    Rectal cancer Neg Hx    Ovarian cancer Neg Hx    Endometrial cancer Neg Hx    Pancreatic cancer Neg Hx   Reviewed on admission  Prior to Admission medications   Medication Sig Start Date End Date Taking? Authorizing Provider  amLODipine (NORVASC) 5 MG tablet TAKE 1 TABLET (5 MG TOTAL) BY MOUTH DAILY. Patient taking differently: Take 5 mg by mouth daily. 03/06/20 03/06/21  Fay Records, MD  carvedilol (COREG) 6.25 MG tablet TAKE 1 TABLET BY MOUTH 2 TIMES DAILY WITH A MEAL. Patient taking differently: Take 6.25 mg by mouth 2 (two) times daily with a meal. 03/06/20  03/06/21  Fay Records, MD  cyclobenzaprine (FLEXERIL) 5 MG tablet Take 1 tablet (5 mg total) by mouth 2 (two) times daily as needed for muscle spasms. 01/27/21   Owens Shark, NP  diclofenac Sodium (VOLTAREN) 1 % GEL Apply 2 g topically 4 (four) times daily as needed (msk pain). Patient not taking: Reported on 01/27/2021 01/14/21   Kayleen Memos, DO  gabapentin (NEURONTIN) 300 MG capsule Take 1 capsule (300 mg total) by mouth at bedtime. 01/14/21 02/13/21  Kayleen Memos, DO  liver oil-zinc oxide (DESITIN) 40 % ointment Apply topically as needed for irritation. 01/14/21   Kayleen Memos, DO  loperamide (IMODIUM) 2 MG capsule Take 1 capsule (2 mg total) by mouth 4 (four) times daily as needed for diarrhea or loose stools. 01/15/21   Sherwood Gambler, MD  metFORMIN (GLUCOPHAGE) 500 MG tablet Take 1 tablet (500 mg total) by mouth  daily. Patient taking differently: Take 500 mg by mouth daily. 07/10/20     ondansetron (ZOFRAN) 8 MG tablet Take 1 tablet (8 mg total) by mouth every 8 (eight) hours as needed for nausea or vomiting. 02/11/21   Ladell Pier, MD  oxyCODONE (OXY IR/ROXICODONE) 5 MG immediate release tablet Take 1-2 tablets (5-10 mg total) by mouth every 4 (four) hours as needed for severe pain. 02/12/21   Ladell Pier, MD  pantoprazole (PROTONIX) 40 MG tablet Take 1 tablet (40 mg total) by mouth daily. Patient not taking: Reported on 02/11/2021 01/14/21 04/14/21  Kayleen Memos, DO  polyethylene glycol (MIRALAX / GLYCOLAX) 17 g packet Take 17 g by mouth daily as needed. Patient not taking: Reported on 02/11/2021 01/14/21   Kayleen Memos, DO  potassium chloride (MICRO-K) 10 MEQ CR capsule Take 1 capsule (10 mEq total) by mouth 3 (three) times daily. 02/11/21   Ladell Pier, MD  potassium chloride SA (KLOR-CON M) 20 MEQ tablet Take 1 tablet twice daily for 3 days then 1 tablet daily Patient not taking: Reported on 02/11/2021 01/27/21 03/02/21  Owens Shark, NP  prochlorperazine (COMPAZINE) 10 MG tablet  Take 1 tablet (10 mg total) by mouth every 6 (six) hours as needed for nausea or vomiting. 02/11/21   Ladell Pier, MD    Physical Exam: Vitals:   02/20/21 1850 02/20/21 1900 02/20/21 1908 02/20/21 2034  BP: 133/79 126/70  123/71  Pulse: (!) 110 (!) 109  (!) 107  Resp: _0 Temp:    99.6 F (37.6 C)  TempSrc:    Oral  SpO2: 96% 94% 98% 100%  Weight:      Height:       Physical Exam Constitutional:      General: She is not in acute distress.    Appearance: Normal appearance. She is obese.     Comments: Somewhat tired appearing  HENT:     Head: Normocephalic and atraumatic.     Mouth/Throat:     Mouth: Mucous membranes are moist.     Pharynx: Oropharynx is clear.  Eyes:     Extraocular Movements: Extraocular movements intact.     Pupils: Pupils are equal, round, and reactive to light.  Cardiovascular:     Rate and Rhythm: Normal rate and regular rhythm.     Pulses: Normal pulses.     Heart sounds: Normal heart sounds.  Pulmonary:     Effort: Pulmonary effort is normal. No respiratory distress.     Breath sounds: Normal breath sounds.  Abdominal:     General: Bowel sounds are normal. There is no distension.     Palpations: Abdomen is soft.     Tenderness: There is no abdominal tenderness.  Musculoskeletal:        General: No swelling or deformity.  Skin:    General: Skin is warm and dry.  Neurological:     General: No focal deficit present.     Mental Status: Mental status is at baseline.   Labs on Admission: I have personally reviewed following labs and imaging studies  CBC: Recent Labs  Lab 02/20/21 1456  WBC 13.3*  NEUTROABS 11.7*  HGB 6.0*  HCT 19.5*  MCV 92.4  PLT 121*    Basic Metabolic Panel: Recent Labs  Lab 02/20/21 1456  NA 145  K 2.7*  CL 108  CO2 24  GLUCOSE 195*  BUN 16  CREATININE 0.91  CALCIUM 8.3*  MG  1.5*    GFR: Estimated Creatinine Clearance: 81.1 mL/min (by C-G formula based on SCr of 0.91 mg/dL).  Liver Function  Tests: Recent Labs  Lab 02/20/21 1456  AST 13*  ALT <5  ALKPHOS 269*  BILITOT 1.1  PROT 6.1*  ALBUMIN 2.6*    Urine analysis:    Component Value Date/Time   COLORURINE YELLOW 12/30/2020 1011   APPEARANCEUR CLOUDY (A) 12/30/2020 1011   LABSPEC 1.024 12/30/2020 1011   PHURINE 5.0 12/30/2020 1011   GLUCOSEU NEGATIVE 12/30/2020 1011   GLUCOSEU NEGATIVE 05/20/2006 1135   HGBUR MODERATE (A) 12/30/2020 1011   BILIRUBINUR NEGATIVE 12/30/2020 1011   BILIRUBINUR small (A) 08/09/2018 1412   KETONESUR NEGATIVE 12/30/2020 1011   PROTEINUR 30 (A) 12/30/2020 1011   UROBILINOGEN 1.0 08/09/2018 1412   UROBILINOGEN 0.2 04/26/2008 1156   NITRITE NEGATIVE 12/30/2020 1011   LEUKOCYTESUR MODERATE (A) 12/30/2020 1011    Radiological Exams on Admission: CT Abdomen Pelvis Wo Contrast  Result Date: 02/20/2021 CLINICAL DATA:  Acute, non localized abdominal pain. Nausea, vomiting, diarrhea and fatigue for the past week. Receiving chemotherapy for gastric and ovarian cancer. EXAM: CT ABDOMEN AND PELVIS WITHOUT CONTRAST TECHNIQUE: Multidetector CT imaging of the abdomen and pelvis was performed following the standard protocol without IV contrast. RADIATION DOSE REDUCTION: This exam was performed according to the departmental dose-optimization program which includes automated exposure control, adjustment of the mA and/or kV according to patient size and/or use of iterative reconstruction technique. COMPARISON:  Chest CT dated 12/17/2020. Abdomen and pelvis CT dated 12/16/2020. FINDINGS: Lower chest: Mildly enlarged heart. Small amount of residual atelectasis/scarring in the right lower lobe, including a somewhat oval wedge-shaped area at the posterior right lung base which is significantly smaller. Interval visualization of a 5 mm nodular density at the right lateral costophrenic angle on image number 20/6. There is a similar area more anteriorly on image number 22/6. There is also an interval 11 x 8 mm sub  solid nodular density in the right middle lobe on image number 9/6. There is also an interval nodular density in the left lower lobe with eccentric cavitation measuring 11 x 7 mm on image number 8/6. Hepatobiliary: No focal liver abnormality is seen. No gallstones, gallbladder wall thickening, or biliary dilatation. Pancreas: Unremarkable. No pancreatic ductal dilatation or surrounding inflammatory changes. Spleen: Normal in size without focal abnormality. Adrenals/Urinary Tract: An exophytic posterior left adrenal mass is again demonstrated. This measures 3.2 x 2.8 cm on image number 19/2, previously 3.2 x 2.6 cm. Stable normal appearing right adrenal gland. Interval mild dilatation of the right renal collecting system and ureter to the level of the ureterovesical junction with no obstructing stone or mass visualized. Stable normal appearing left kidney and ureter. The urinary bladder is poorly distended with mild to moderate diffuse wall thickening. Stomach/Bowel: A small hiatal hernia is unchanged. Unremarkable small bowel and colon. Normal appearing appendix. Vascular/Lymphatic: Bilateral enlarged pelvic and inguinal lymph nodes with progression. The largest is a left pelvic sidewall node with a short axis diameter of 17 mm on image number 75/2, previously 14 mm. Reproductive: A large left adnexal mass is again demonstrated with an interval increase in size. This measures 13.0 x 8.2 cm on image number 64/2, previously 10.8 x 8.8 cm. A right adnexal mass is again demonstrated, currently measuring 3.4 x 2.7 cm on image number 67/2, previously 2.9 cm in maximum diameter. Normal appearing uterus. Other: Small umbilical and infraumbilical hernias containing fat. Small left inguinal hernia containing fat.  Musculoskeletal: Interval diffuse heterogeneous bone sclerosis. IMPRESSION: 1. Increased size of bilateral ovarian masses, especially on the left. 2. Interval diffuse bony metastatic disease. 3. Progressive  metastatic bilateral pelvic and inguinal lymphadenopathy. 4. Increased size of a left adrenal metastasis. 5. Interval nodular densities at the lung bases suspicious for metastases. 6. Interval mild right hydronephrosis and hydroureter to the level of the ureterovesical junction. This could be due to recent stone passage, obstruction by wall thickening of the bladder or a nonvisualized obstructing ureteral metastasis. Electronically Signed   By: Claudie Revering M.D.   On: 02/20/2021 18:28    EKG: Not yet performed  Assessment/Plan Principal Problem:   Severe anemia Active Problems:   Essential hypertension   Diabetes mellitus (HCC)   OSA (obstructive sleep apnea)   Obesity hypoventilation syndrome (HCC)   Hypokalemia   Adenocarcinoma (HCC)   Metastatic adenocarcinoma of unknown origin ([place])   Cancer related pain  Severe anemia > Patient presenting from oncology office noted to have hemoglobin now 6 down from 8 9 days ago and 9 3 weeks ago.  Is FOBT negative in the ED.  No bloody stools reported.  No other sources of bleeding. > Most likely etiology secondary to chemotherapy as below. > Noted to have some tachycardia and tachypnea in the ED. - Monitor on telemetry - Continue with 1 unit transfusion as ordered in the ED - Trend CBC  Hypokalemia > Potassium noted to be 2.7.  10 mEq IV potassium given in ED. > In the setting of significant nausea vomiting likely secondary to chemotherapy - Add additional 20 mEq IV potassium - Add 80 mEq p.o. potassium - Check magnesium - Trend electrolytes  Nausea Vomiting Diarrhea Metastatic adenocarcinoma > History of metastatic adenocarcinoma unclear primary, suspected gastric/GI. > Has received FOLFOX treatment.  Last dose was held due to nausea vomiting and weight loss. > Has anemia likely secondary to FOLFOX as above.  Also with hypokalemia in the setting of nausea and vomiting which is likely secondary to chemotherapy > CT scan performed in the  ED did note overall increase size of tumor burden, increased lymphadenopathy, and likely new lung mets. - We will likely benefit with this information being discussed with her oncology team considering the CT changes - Supportive care - As needed pain medication for cancer pain - As needed Zofran for nausea  Leukocytosis > Noted to have leukocytosis of 13.3 on presentation. > Unclear etiology.  Could be a degree of hemoconcentration.  No current evidence of infection on CT of the abdomen pelvis.  We will check chest x-ray and urinalysis. > Last dose of chemotherapy was held, unclear if this could cause rebound? - Continue to trend fever curve and white count - Chest x-ray - Urinalysis  Diabetes - SSI  Hypertension - Continue home amlodipine and carvedilol  OSA OHS - Continue CPAP   DVT prophylaxis: SCDs for now Code Status:   Full Family Communication:  None on admission.  She states that her family members are aware that she is in the hospital and that she is staying.  She states that she does not need him to receive additional update at this time. Disposition Plan:   Patient is from:  Home  Anticipated DC to:  Home  Anticipated DC date:  1 to 3 days  Anticipated DC barriers: None  Consults called:  None  Admission status:  Observation, telemetry   Severity of Illness: The appropriate patient status for this patient is OBSERVATION. Observation status  is judged to be reasonable and necessary in order to provide the required intensity of service to ensure the patient's safety. The patient's presenting symptoms, physical exam findings, and initial radiographic and laboratory data in the context of their medical condition is felt to place them at decreased risk for further clinical deterioration. Furthermore, it is anticipated that the patient will be medically stable for discharge from the hospital within 2 midnights of admission.    Marcelyn Bruins MD [facility]  How to contact the [facility] Attending or Consulting provider [place] or covering provider during after hours [place], for this patient?   Check the care team in [facility] and look for a) attending/consulting [facility] provider listed and b) the [facility] team listed Log into [email or web address] and use [place]'s universal password to access. If you do not have the password, please contact the hospital operator. Locate the [facility] provider you are looking for under [facility] and page to a number that you can be directly reached. If you still have difficulty reaching the provider, please page the [facility] (Director on Call) for the Hospitalists listed on amion for assistance.  02/20/2021, 8:53 PM

## 2021-02-20 NOTE — Progress Notes (Signed)
CRITICAL VALUE STICKER  CRITICAL VALUE: Hgb 6.0  RECEIVER (on-site recipient of call):Brodi Nery,RN  DATE & TIME NOTIFIED: 02/20/21 @ 1510  MESSENGER (representative from lab):Phyllis  MD NOTIFIED: Ned Card, NP  TIME OF NOTIFICATION: 5732  RESPONSE:  Admit to hospital

## 2021-02-20 NOTE — Progress Notes (Signed)
Per Lattie Haw, NP order: Pt. Transferred to [facility] ER via EMS.

## 2021-02-20 NOTE — Patient Instructions (Signed)
Rehydration, Adult Rehydration is the replacement of body fluids, salts, and minerals (electrolytes) that are lost during dehydration. Dehydration is when there is not enough water or other fluids in the body. This happens when you lose more fluids than you take in. Common causes of dehydration include: Not drinking enough fluids. This can occur when you are ill or doing activities that require a lot of energy, especially in hot weather. Conditions that cause loss of water or other fluids, such as diarrhea, vomiting, sweating, or urinating a lot. Other illnesses, such as fever or infection. Certain medicines, such as those that remove excess fluid from the body (diuretics). Symptoms of mild or moderate dehydration may include thirst, dry lips and mouth, and dizziness. Symptoms of severe dehydration may include increased heart rate, confusion, fainting, and not urinating. For severe dehydration, you may need to get fluids through an IV at the hospital. For mild or moderate dehydration, you can usually rehydrate at home by drinking certain fluids as told by your health care provider. What are the risks? Generally, rehydration is safe. However, taking in too much fluid (overhydration) can be a problem. This is rare. Overhydration can cause an electrolyte imbalance, kidney failure, or a decrease in salt (sodium) levels in the body. Supplies needed You will need an oral rehydration solution (ORS) if your health care provider tells you to use one. This is a drink to treat dehydration. It can be found in pharmacies and retail stores. How to rehydrate Fluids Follow instructions from your health care provider for rehydration. The kind of fluid and the amount you should drink depend on your condition. In general, you should choose drinks that you prefer. If told by your health care provider, drink an ORS. Make an ORS by following instructions on the package. Start by drinking small amounts, about  cup (120  mL) every 5-10 minutes. Slowly increase how much you drink until you have taken the amount recommended by your health care provider. Drink enough clear fluids to keep your urine pale yellow. If you were told to drink an ORS, finish it first, then start slowly drinking other clear fluids. Drink fluids such as: Water. This includes sparkling water and flavored water. Drinking only water can lead to having too little sodium in your body (hyponatremia). Follow the advice of your health care provider. Water from ice chips you suck on. Fruit juice with water you add to it (diluted). Sports drinks. Hot or cold herbal teas. Broth-based soups. Milk or milk products. Food Follow instructions from your health care provider about what to eat while you rehydrate. Your health care provider may recommend that you slowly begin eating regular foods in small amounts. Eat foods that contain a healthy balance of electrolytes, such as bananas, oranges, potatoes, tomatoes, and spinach. Avoid foods that are greasy or contain a lot of sugar. In some cases, you may get nutrition through a feeding tube that is passed through your nose and into your stomach (nasogastric tube, or NG tube). This may be done if you have uncontrolled vomiting or diarrhea. Beverages to avoid Certain beverages may make dehydration worse. While you rehydrate, avoid drinking alcohol. How to tell if you are recovering from dehydration You may be recovering from dehydration if: You are urinating more often than before you started rehydrating. Your urine is pale yellow. Your energy level improves. You vomit less frequently. You have diarrhea less frequently. Your appetite improves or returns to normal. You feel less dizzy or less light-headed. Your  skin tone and color start to look more normal. Follow these instructions at home: Take over-the-counter and prescription medicines only as told by your health care provider. Do not take sodium  tablets. Doing this can lead to having too much sodium in your body (hypernatremia). Contact a health care provider if: You continue to have symptoms of mild or moderate dehydration, such as: Thirst. Dry lips. Slightly dry mouth. Dizziness. Dark urine or less urine than normal. Muscle cramps. You continue to vomit or have diarrhea. Get help right away if you: Have symptoms of dehydration that get worse. Have a fever. Have a severe headache. Have been vomiting and the following happens: Your vomiting gets worse or does not go away. Your vomit includes blood or green matter (bile). You cannot eat or drink without vomiting. Have problems with urination or bowel movements, such as: Diarrhea that gets worse or does not go away. Blood in your stool (feces). This may cause stool to look black and tarry. Not urinating, or urinating only a small amount of very dark urine, within 6-8 hours. Have trouble breathing. Have symptoms that get worse with treatment. These symptoms may represent a serious problem that is an emergency. Do not wait to see if the symptoms will go away. Get medical help right away. Call your local emergency services (911 in the [place] Do not drive yourself to the hospital. Summary Rehydration is the replacement of body fluids and minerals (electrolytes) that are lost during dehydration. Follow instructions from your health care provider for rehydration. The kind of fluid and amount you should drink depend on your condition. Slowly increase how much you drink until you have taken the amount recommended by your health care provider. Contact your health care provider if you continue to show signs of mild or moderate dehydration. This information is not intended to replace advice given to you by your health care provider. Make sure you discuss any questions you have with your health care provider. Document Revised: 03/29/2019 Document Reviewed: 02/06/2019 Elsevier Patient  Education  2022 [employer].  Morphine Injection What is this medication? MORPHINE (MOR feen) treats severe pain. It is prescribed when other pain medications have not worked or cannot be tolerated. It works by blocking pain signals in the brain. It belongs to a group of medications called opioids. This medicine may be used for other purposes; ask your health care provider or pharmacist if you have questions. COMMON BRAND NAME(S): Astramorph PF, Duramorph, Duramorph PF, Infumorph, MITIGO What should I tell my care team before I take this medication? They need to know if you have any of these conditions: Bleeding disorder Brain tumor Drug abuse or addiction Head injury Heart disease If you often drink alcohol Low adrenal gland function Lung disease, asthma, or breathing problem Seizures Stomach or intestine problems Take medications that treat or prevent blood clots Taken an MAOI such as Marplan, Nardil, or Parnate in the last 14 days Trouble passing urine An unusual or allergic reaction to morphine, other medications, foods, dyes, or preservatives Pregnant or trying to get pregnant Breast-feeding How should I use this medication? This medication is for injection into a muscle, vein, or under the skin. It is usually given in a hospital or clinic setting. If you get this medication at home, you will be taught how to prepare and give this medication. Use exactly as directed. Take your medication at regular intervals. Do not take your medication more often than directed. Always look at your medication before using  it. Do not use the injection if its color is darker than pale yellow or if it is discolored in any other way. Do not use this medication if it is cloudy, thickened, colored, or has solid particles in it. It is important that you put your used needles and syringes in a special sharps container. Do not put them in a trash can. If you do not have a sharps container, call your  pharmacist or care team to get one. Talk to your care team regarding the use of this medication in children. Special care may be needed. Overdosage: If you think you have taken too much of this medicine contact a poison control center or emergency room at once. NOTE: This medicine is only for you. Do not share this medicine with others. What if I miss a dose? If you miss a dose, take it as soon as you can. If it is almost time for your next dose, take only that dose. Do not take double or extra doses. What may interact with this medication? Do not take this medication with any of the following: Linezolid MAOIs like Marplan, Nardil, and Parnate Methylene blue Samidorphan This medication may interact with the following: Alcohol Antihistamines for allergy, cough, and cold Atropine Certain medications for anxiety or sleep Certain medications for bladder problems like oxybutynin, tolterodine Certain medications for depression like amitriptyline, fluoxetine, sertraline, mirtazapine, trazodone Certain medications for migraine headache like almotriptan, eletriptan, frovatriptan, naratriptan, rizatriptan, sumatriptan, zolmitriptan Certain medications for nausea or vomiting like dolasetron, granisetron, ondansetron, palonosetron Certain medications for Parkinson's disease like benztropine, trihexyphenidyl Certain medications for seizures like phenobarbital, primidone Certain medications for stomach problems like dicyclomine, hyoscyamine Certain medications for travel sickness like scopolamine Clopidogrel Diuretics General anesthetics like halothane, isoflurane, methoxyflurane, propofol Ipratropium Medications that relax muscles Other narcotic medications for pain or cough Phenothiazines like chlorpromazine, mesoridazine, prochlorperazine, thioridazine Prasugrel Ticagrelor This list may not describe all possible interactions. Give your health care provider a list of all the medicines, herbs,  non-prescription drugs, or dietary supplements you use. Also tell them if you smoke, drink alcohol, or use illegal drugs. Some items may interact with your medicine. What should I watch for while using this medication? Tell your care team if your pain does not go away, if it gets worse, or if you have new or a different type of pain. You may develop tolerance to this medication. Tolerance means that you will need a higher dose of the medication for pain relief. Tolerance is normal and is expected if you take this medication for a long time. There are different types of narcotic medications (opioids) for pain. If you take more than one type at the same time, you may have more side effects. Give your care team a list of all medications you use. He or she will tell you how much medication to take. Do not take more medication than directed. Get emergency help right away if you have problems breathing. Do not suddenly stop taking your medication because you may develop a severe reaction. Your body becomes used to the medication. This does NOT mean you are addicted. Addiction is a behavior related to getting and using a medication for a nonmedical reason. If you have pain, you have a medical reason to take pain medication. Your care team will tell you how much medication to take. If your care team wants you to stop the medication, the dose will be slowly lowered over time to avoid any side effects. Talk to your  care team about naloxone and how to get it. Naloxone is an emergency medication used for an opioid overdose. An overdose can happen if you take too much opioid. It can also happen if an opioid is taken with some other medications or substances, like alcohol. Know the symptoms of an overdose, like trouble breathing, unusually tired or sleepy, or not being able to respond or wake up. Make sure to tell caregivers and close contacts where it is stored. Make sure they know how to use it. After naloxone is given,  you must get emergency help right away. Naloxone is a temporary treatment. Repeat doses may be needed. You may get drowsy or dizzy. Do not drive, use machinery, or do anything that needs mental alertness until you know how this medication affects you. Do not stand up or sit up quickly, especially if you are an older patient. This reduces the risk of dizzy or fainting spells. Alcohol may interfere with the effect of this medication. Avoid alcoholic drinks. This medication will cause constipation. If you do not have a bowel movement for 3 days, call your care team. Your mouth may get dry. Chewing sugarless gum or sucking hard candy and drinking plenty of water may help. Contact your care team if the problem does not go away or is severe. What side effects may I notice from receiving this medication? Side effects that you should report to your care team as soon as possible: Allergic reactions--skin rash, itching, hives, swelling of the face, lips, tongue, or throat CNS depression--slow or shallow breathing, shortness of breath, feeling faint, dizziness, confusion, difficulty staying awake Low adrenal gland function--nausea, vomiting, loss of appetite, unusual weakness or fatigue, dizziness Low blood pressure--dizziness, feeling faint or lightheaded, blurry vision Side effects that usually do not require medical attention (report to your care team if they continue or are bothersome): Constipation Dizziness Drowsiness Dry mouth Headache Nausea Vomiting This list may not describe all possible side effects. Call your doctor for medical advice about side effects. You may report side effects to FDA at 1-800-FDA-1088. Where should I keep my medication? Keep out of the reach of children and pets. This medication can be abused. Keep it in a safe place to protect it from theft. Do not share this medication with anyone. Selling or giving away this medication is dangerous and is against the law. If you are  using this medication at home, you will be instructed on how to store this medication. Throw away any unused medication after the expiration date on the label. Discard unused medication and used packaging carefully. Children and pets can be harmed if they find used or lost packages. NOTE: This sheet is a summary. It may not cover all possible information. If you have questions about this medicine, talk to your doctor, pharmacist, or health care provider.  2022 Elsevier/[employer] Standard (2020-03-08 00:00:00)

## 2021-02-20 NOTE — ED Triage Notes (Signed)
Patient was at [facility] to receive Chemo. for Gastric and Ovarian Cancer. EMS reports that pt has been c/o  N/V/D and fatigue for the last week. Patient received IL NS and 2mg  morphine at cancer center. Staff requested patient be brought to [facility] via EMS

## 2021-02-21 DIAGNOSIS — Z7189 Other specified counseling: Secondary | ICD-10-CM

## 2021-02-21 DIAGNOSIS — D696 Thrombocytopenia, unspecified: Secondary | ICD-10-CM

## 2021-02-21 DIAGNOSIS — D649 Anemia, unspecified: Secondary | ICD-10-CM

## 2021-02-21 DIAGNOSIS — G893 Neoplasm related pain (acute) (chronic): Secondary | ICD-10-CM | POA: Diagnosis not present

## 2021-02-21 DIAGNOSIS — R112 Nausea with vomiting, unspecified: Secondary | ICD-10-CM

## 2021-02-21 DIAGNOSIS — C801 Malignant (primary) neoplasm, unspecified: Secondary | ICD-10-CM | POA: Diagnosis not present

## 2021-02-21 LAB — CBC
HCT: 20 % — ABNORMAL LOW (ref 36.0–46.0)
Hemoglobin: 6.2 g/dL — CL (ref 12.0–15.0)
MCH: 28.4 pg (ref 26.0–34.0)
MCHC: 31 g/dL (ref 30.0–36.0)
MCV: 91.7 fL (ref 80.0–100.0)
Platelets: 106 10*3/uL — ABNORMAL LOW (ref 150–400)
RBC: 2.18 MIL/uL — ABNORMAL LOW (ref 3.87–5.11)
RDW: 19.1 % — ABNORMAL HIGH (ref 11.5–15.5)
WBC: 12.5 10*3/uL — ABNORMAL HIGH (ref 4.0–10.5)
nRBC: 0.2 % (ref 0.0–0.2)

## 2021-02-21 LAB — COMPREHENSIVE METABOLIC PANEL
ALT: 6 U/L (ref 0–44)
AST: 17 U/L (ref 15–41)
Albumin: 2 g/dL — ABNORMAL LOW (ref 3.5–5.0)
Alkaline Phosphatase: 229 U/L — ABNORMAL HIGH (ref 38–126)
Anion gap: 8 (ref 5–15)
BUN: 16 mg/dL (ref 6–20)
CO2: 23 mmol/L (ref 22–32)
Calcium: 8.4 mg/dL — ABNORMAL LOW (ref 8.9–10.3)
Chloride: 108 mmol/L (ref 98–111)
Creatinine, Ser: 0.96 mg/dL (ref 0.44–1.00)
GFR, Estimated: >60 mL/min (ref >60)
Glucose, Bld: 106 mg/dL — ABNORMAL HIGH (ref 70–99)
Potassium: 2.9 mmol/L — ABNORMAL LOW (ref 3.5–5.1)
Sodium: 139 mmol/L (ref 135–145)
Total Bilirubin: 1.2 mg/dL (ref 0.3–1.2)
Total Protein: 5.7 g/dL — ABNORMAL LOW (ref 6.5–8.1)

## 2021-02-21 LAB — URINALYSIS, ROUTINE W REFLEX MICROSCOPIC
Bilirubin Urine: NEGATIVE
Glucose, UA: NEGATIVE mg/dL
Ketones, ur: NEGATIVE mg/dL
Nitrite: NEGATIVE
Protein, ur: 30 mg/dL — AB
Specific Gravity, Urine: 1.017 (ref 1.005–1.030)
WBC, UA: >50 WBC/hpf — ABNORMAL HIGH (ref 0–5)
pH: 6 (ref 5.0–8.0)

## 2021-02-21 LAB — GLUCOSE, CAPILLARY
Glucose-Capillary: 101 mg/dL — ABNORMAL HIGH (ref 70–99)
Glucose-Capillary: 152 mg/dL — ABNORMAL HIGH (ref 70–99)
Glucose-Capillary: 94 mg/dL (ref 70–99)
Glucose-Capillary: 123 mg/dL — ABNORMAL HIGH (ref 70–99)

## 2021-02-21 LAB — HEMOGLOBIN AND HEMATOCRIT, BLOOD
HCT: 21.1 % — ABNORMAL LOW (ref 36.0–46.0)
HCT: 25.1 % — ABNORMAL LOW (ref 36.0–46.0)
Hemoglobin: 6.5 g/dL — CL (ref 12.0–15.0)
Hemoglobin: 8.1 g/dL — ABNORMAL LOW (ref 12.0–15.0)

## 2021-02-21 LAB — MAGNESIUM: Magnesium: 1.6 mg/dL — ABNORMAL LOW (ref 1.7–2.4)

## 2021-02-21 LAB — PREPARE RBC (CROSSMATCH)

## 2021-02-21 LAB — HIV ANTIBODY (ROUTINE TESTING W REFLEX): HIV Screen 4th Generation wRfx: REACTIVE — AB

## 2021-02-21 MED ORDER — CHLORHEXIDINE GLUCONATE CLOTH 2 % EX PADS
6.0000 | MEDICATED_PAD | Freq: Every day | CUTANEOUS | Status: DC
  Administered 2021-02-21 – 2021-03-13 (×20): 6 via TOPICAL

## 2021-02-21 MED ORDER — METOCLOPRAMIDE HCL 5 MG/ML IJ SOLN
5.0000 mg | Freq: Three times a day (TID) | INTRAMUSCULAR | Status: DC | PRN
  Administered 2021-02-21: 5 mg via INTRAVENOUS
  Filled 2021-02-21: qty 2

## 2021-02-21 MED ORDER — SODIUM CHLORIDE 0.9% IV SOLUTION: Freq: Once | INTRAVENOUS | Status: AC

## 2021-02-21 MED ORDER — PROSOURCE PLUS PO LIQD
30.0000 mL | Freq: Two times a day (BID) | ORAL | Status: DC
  Administered 2021-02-21 – 2021-03-03 (×4): 30 mL via ORAL
  Filled 2021-02-21 (×8): qty 30

## 2021-02-21 MED ORDER — BOOST / RESOURCE BREEZE PO LIQD CUSTOM
1.0000 | Freq: Three times a day (TID) | ORAL | Status: DC
  Administered 2021-02-21 – 2021-03-02 (×8): 1 via ORAL

## 2021-02-21 MED ORDER — ADULT MULTIVITAMIN W/MINERALS CH
1.0000 | ORAL_TABLET | Freq: Every day | ORAL | Status: DC
  Administered 2021-02-21 – 2021-03-13 (×21): 1 via ORAL
  Filled 2021-02-21 (×22): qty 1

## 2021-02-21 MED ORDER — SODIUM CHLORIDE 0.9% FLUSH: 10.0000 mL | INTRAVENOUS | Status: DC | PRN

## 2021-02-21 MED ORDER — MAGNESIUM SULFATE 2 GM/50ML IV SOLN
2.0000 g | Freq: Once | INTRAVENOUS | Status: AC
Start: 2021-02-21 — End: 2021-02-21
  Administered 2021-02-21: 2 g via INTRAVENOUS
  Filled 2021-02-21: qty 50

## 2021-02-21 MED ORDER — POTASSIUM CHLORIDE 20 MEQ PO PACK
40.0000 meq | PACK | ORAL | Status: AC
  Administered 2021-02-21 (×2): 40 meq via ORAL
  Filled 2021-02-21 (×2): qty 2

## 2021-02-21 MED ORDER — SODIUM CHLORIDE 0.9% FLUSH: 10.0000 mL | Freq: Two times a day (BID) | INTRAVENOUS | Status: DC

## 2021-02-21 NOTE — Progress Notes (Signed)
PROGRESS NOTE  Erin [name] ESL:[id number] DOB: 1965-12-05   PCP: Pcp, No  Patient is from: Home.  Lives with sister.  Uses walker for ambulation.  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  She was on FOLFOX.  Last treatment was on 01/27/2021.  Was unable to continue due to GI symptoms.  In ED, slightly tachycardic.  Hgb 6 (from 8 9 days prior and 9 3 weeks prior).  WBC 13.3.  Platelet 121.  FOBT negative.  K2.7.  CT abdomen and pelvis with increased ovarian mass size, new bony mets, increased LAD, increased left adrenal met, lung nodule suspicious for mets and mild right hydronephrosis with no stone.  Received IV fluid and IV K.  2 units of packed RBC ordered.   Subjective: Seen and examined earlier this morning.  No major events overnight of this morning.  Feels tired and weak.  She denies nausea but poor appetite.  Some vague abdominal pain.  She had 2 units but posttransfusion H&H remained low at 6.2.  She denies melena, hematochezia or vaginal bleeding.  Objective: Vitals:   02/21/21 0928 02/21/21 1200 02/21/21 1243 02/21/21 1311  BP: (!) 144/87  116/76 112/79  Pulse: (!) 126  96 91  Resp: 20 (!) 22 (!) 24 18  Temp: (!) 97.4 F (36.3 C)  98.4 F (36.9 C) 98.3 F (36.8 C)  TempSrc: Oral  Oral Oral  SpO2: 95%  92% 94%  Weight:      Height:        Examination:  GENERAL: No apparent distress.  Nontoxic. HEENT: MMM.  Vision and hearing grossly intact.  NECK: Supple.  No apparent JVD.  RESP: 94% on RA.  No IWOB.  Fair aeration  bilaterally. CVS:  RRR. Heart sounds normal.  ABD/GI/GU: BS+. Abd soft, NTND but limited exam due to body habitus.  MSK/EXT:  Moves extremities. No apparent deformity. No edema.  SKIN: no apparent skin lesion or wound NEURO: Awake, alert and oriented appropriately.  No apparent focal neuro deficit. PSYCH: Calm. Normal affect.   Procedures:  None  Microbiology summarized: [id number] and influenza PCR nonreactive.  Assessment & Plan: Severe symptomatic anemia-likely due to adenocarcinoma on chemotherapy.  Hgb nadir at 6.0 but only improved to 6.2 after 2 units.  Patient denies melena, hematochezia or vaginal bleed. Recent Labs    01/08/21 0830 01/09/21 0408 01/11/21 0630 01/14/21 0650 01/15/21 1256 01/27/21 0856 02/11/21 1445 02/20/21 1456 02/21/21 0756 02/21/21 1003  HGB 6.8* 8.7* 8.7* 8.6* 10.1* 9.1* 7.8* 6.0* 6.2* 6.5*  -Transfuse additional 1 unit to keep Hgb> 7.0. -Oncology following  Poorly differentiated metastatic adenocarcinoma with unclear primary source but suspected to be gastric: Was on FOLFOX.  Last therapy on 01/27/2021.  Could not continue due to GI symptoms. CT abdomen and pelvis with new meds and progression of malignancy. -Oncology following.  Nausea/vomiting/abdominal pain/diarrhea: Likely due to malignancy.  Could be from opiate as well. -Supportive care with IV fluid, analgesics and antiemetics -Trial of Reglan  Fairly controlled NIDDM-2 with hyperglycemia: A1c 7.1% on 11/80/22.  On metformin  at home. Recent Labs  Lab 02/20/21 2150 02/21/21 0727 02/21/21 1149  GLUCAP 93 101* 152*  -Continue current insulin regimen  Essential hypertension: Normotensive. -Continue home amlodipine and carvedilol  Goal of care counseling: Discussed about CODE STATUS.  Prefers to remain full code. -Consult palliative care   Cancer-related pain -Continue home medications.   Leukocytosis/bandemia: Likely related to malignancy.  No clear source of  infection. -Continue monitoring  Thrombocytopenia: Likely related to malignancy and/oral chemotherapy. -Continue monitoring  Hypokalemia/hypomagnesemia: Likely due to GI loss. -Replenish and recheck  OSA/OHS -Continue CPAP.  Pyuria: UA with large LE and rare bacteria but no nitrite.  Patient denies UTI symptoms. -Doubt indication for antibiotics  Morbid obesity Body mass index is 40.34 kg/m.         DVT prophylaxis:  SCDs Start: 02/20/21 2025  Code Status: Full code Family Communication: Patient and/or RN. Available if any question.  Level of care: Telemetry Status is: Inpatient  Remains inpatient appropriate because: Symptomatic anemia requiring blood transfusion       Consultants:  Oncology Palliative medicine   Sch Meds:  Scheduled Meds:  amLODipine  5 mg Oral Daily   carvedilol  6.25 mg Oral BID WC   Chlorhexidine Gluconate Cloth  6 each Topical Daily   insulin aspart  0-15 Units Subcutaneous TID WC   insulin aspart  0-5 Units Subcutaneous QHS   pantoprazole  40 mg Oral Daily   potassium chloride  80 mEq Oral Once   sodium chloride flush  10-40 mL Intracatheter Q12H   sodium chloride flush  3 mL Intravenous Q12H   Continuous Infusions: PRN Meds:.acetaminophen **OR** acetaminophen, loperamide, ondansetron (ZOFRAN) IV, oxyCODONE, sodium chloride flush  Antimicrobials: Anti-infectives (From admission, onward)    None        I have personally reviewed the following labs and images: CBC: Recent Labs  Lab 02/20/21 1456 02/21/21 0756 02/21/21 1003  WBC 13.3* 12.5*  --   NEUTROABS 11.7*  --   --   HGB 6.0* 6.2* 6.5*  HCT 19.5* 20.0* 21.1*  MCV 92.4 91.7  --   PLT 121* 106*  --    BMP &GFR Recent Labs  Lab 02/20/21 1456 02/21/21 0756  NA 145 139  K 2.7* 2.9*  CL 108 108  CO2 24 23  GLUCOSE 195* 106*  BUN 16 16  CREATININE 0.91 0.96  CALCIUM 8.3* 8.4*  MG 1.5* 1.6*   Estimated Creatinine Clearance: 79.8 mL/min (by C-G formula  based on SCr of 0.96 mg/dL). Liver & Pancreas: Recent Labs  Lab 02/20/21 1456 02/21/21 0756  AST 13* 17  ALT <5 6  ALKPHOS 269* 229*  BILITOT 1.1 1.2  PROT 6.1* 5.7*  ALBUMIN 2.6* 2.0*   No results for input(s): LIPASE, AMYLASE in the last 168 hours. No results for input(s): AMMONIA in the last 168 hours. Diabetic: No results for input(s): HGBA1C in the last 72 hours. Recent Labs  Lab 02/20/21 2150 02/21/21 0727 02/21/21 1149  GLUCAP 93 101* 152*   Cardiac Enzymes: No results for input(s): CKTOTAL, CKMB, CKMBINDEX, TROPONINI in the last 168 hours. No results for input(s): PROBNP in the last 8760 hours. Coagulation Profile: No results for input(s): INR, PROTIME in the last 168 hours. Thyroid Function Tests: No results for input(s): TSH, T4TOTAL, FREET4, T3FREE, THYROIDAB in the last 72 hours. Lipid Profile: No results for input(s): CHOL, HDL, LDLCALC, TRIG, CHOLHDL, LDLDIRECT in the last 72 hours. Anemia Panel: No results for input(s): VITAMINB12, FOLATE, FERRITIN, TIBC, IRON,  RETICCTPCT in the last 72 hours. Urine analysis:    Component Value Date/Time   COLORURINE AMBER (A) 02/21/2021 1215   APPEARANCEUR HAZY (A) 02/21/2021 1215   LABSPEC 1.017 02/21/2021 1215   PHURINE 6.0 02/21/2021 1215   GLUCOSEU NEGATIVE 02/21/2021 1215   GLUCOSEU NEGATIVE 05/20/2006 1135   HGBUR SMALL (A) 02/21/2021 1215   BILIRUBINUR NEGATIVE 02/21/2021 1215   BILIRUBINUR small (A) 08/09/2018 1412   KETONESUR NEGATIVE 02/21/2021 1215   PROTEINUR 30 (A) 02/21/2021 1215   UROBILINOGEN 1.0 08/09/2018 1412   UROBILINOGEN 0.2 04/26/2008 1156   NITRITE NEGATIVE 02/21/2021 1215   LEUKOCYTESUR LARGE (A) 02/21/2021 1215   Sepsis Labs: Invalid input(s): PROCALCITONIN, [place]  Microbiology: Recent Results (from the past 240 hour(s))  Resp Panel by RT-PCR (Flu A&B, Covid) Nasopharyngeal Swab     Status: None   Collection Time: 02/20/21  5:46 PM   Specimen: Nasopharyngeal Swab;  Nasopharyngeal(NP) swabs in vial transport medium  Result Value Ref Range Status   SARS Coronavirus 2 by RT PCR NEGATIVE NEGATIVE Final    Comment: (NOTE) SARS-CoV-2 target nucleic acids are NOT DETECTED.  The SARS-CoV-2 RNA is generally detectable in upper respiratory specimens during the acute phase of infection. The lowest concentration of SARS-CoV-2 viral copies this assay can detect is 138 copies/mL. A negative result does not preclude SARS-Cov-2 infection and should not be used as the sole basis for treatment or other patient management decisions. A negative result may occur with  improper specimen collection/handling, submission of specimen other than nasopharyngeal swab, presence of viral mutation(s) within the areas targeted by this assay, and inadequate number of viral copies(<138 copies/mL). A negative result must be combined with clinical observations, patient history, and epidemiological information. The expected result is Negative.  Fact Sheet for Patients:  EntrepreneurPulse.com.au  Fact Sheet for Healthcare Providers:  IncredibleEmployment.be  This test is no t yet approved or cleared by the [place] FDA and  has been authorized for detection and/or diagnosis of SARS-CoV-2 by FDA under an Emergency Use Authorization (EUA). This EUA will remain  in effect (meaning this test can be used) for the duration of the COVID-19 declaration under Section 564(b)(1) of the Act, 21 U.S.C.section 360bbb-3(b)(1), unless the authorization is terminated  or revoked sooner.       Influenza A by PCR NEGATIVE NEGATIVE Final   Influenza B by PCR NEGATIVE NEGATIVE Final    Comment: (NOTE) The Xpert Xpress SARS-CoV-2/FLU/RSV plus assay is intended as an aid in the diagnosis of influenza from Nasopharyngeal swab specimens and should not be used as a sole basis for treatment. Nasal washings and aspirates are unacceptable for Xpert Xpress  SARS-CoV-2/FLU/RSV testing.  Fact Sheet for Patients: EntrepreneurPulse.com.au  Fact Sheet for Healthcare Providers: IncredibleEmployment.be  This test is not yet approved or cleared by the [place] FDA and has been authorized for detection and/or diagnosis of SARS-CoV-2 by FDA under an Emergency Use Authorization (EUA). This EUA will remain in effect (meaning this test can be used) for the duration of the COVID-19 declaration under Section 564(b)(1) of the Act, 21 U.S.C. section 360bbb-3(b)(1), unless the authorization is terminated or revoked.  Performed at [facility], [place] [address] [place], [place] [address]     Radiology Studies: CT Abdomen Pelvis Wo Contrast  Result Date: 02/20/2021 CLINICAL DATA:  Acute, non localized abdominal pain. Nausea, vomiting, diarrhea and fatigue for the past week. Receiving chemotherapy for gastric and ovarian cancer. EXAM: CT ABDOMEN AND PELVIS WITHOUT CONTRAST TECHNIQUE:  Multidetector CT imaging of the abdomen and pelvis was performed following the standard protocol without IV contrast. RADIATION DOSE REDUCTION: This exam was performed according to the departmental dose-optimization program which includes automated exposure control, adjustment of the mA and/or kV according to patient size and/or use of iterative reconstruction technique. COMPARISON:  Chest CT dated 12/17/2020. Abdomen and pelvis CT dated 12/16/2020. FINDINGS: Lower chest: Mildly enlarged heart. Small amount of residual atelectasis/scarring in the right lower lobe, including a somewhat oval wedge-shaped area at the posterior right lung base which is significantly smaller. Interval visualization of a 5 mm nodular density at the right lateral costophrenic angle on image number 20/6. There is a similar area more anteriorly on image number 22/6. There is also an interval 11 x 8 mm sub solid nodular density in the right middle lobe on  image number 9/6. There is also an interval nodular density in the left lower lobe with eccentric cavitation measuring 11 x 7 mm on image number 8/6. Hepatobiliary: No focal liver abnormality is seen. No gallstones, gallbladder wall thickening, or biliary dilatation. Pancreas: Unremarkable. No pancreatic ductal dilatation or surrounding inflammatory changes. Spleen: Normal in size without focal abnormality. Adrenals/Urinary Tract: An exophytic posterior left adrenal mass is again demonstrated. This measures 3.2 x 2.8 cm on image number 19/2, previously 3.2 x 2.6 cm. Stable normal appearing right adrenal gland. Interval mild dilatation of the right renal collecting system and ureter to the level of the ureterovesical junction with no obstructing stone or mass visualized. Stable normal appearing left kidney and ureter. The urinary bladder is poorly distended with mild to moderate diffuse wall thickening. Stomach/Bowel: A small hiatal hernia is unchanged. Unremarkable small bowel and colon. Normal appearing appendix. Vascular/Lymphatic: Bilateral enlarged pelvic and inguinal lymph nodes with progression. The largest is a left pelvic sidewall node with a short axis diameter of 17 mm on image number 75/2, previously 14 mm. Reproductive: A large left adnexal mass is again demonstrated with an interval increase in size. This measures 13.0 x 8.2 cm on image number 64/2, previously 10.8 x 8.8 cm. A right adnexal mass is again demonstrated, currently measuring 3.4 x 2.7 cm on image number 67/2, previously 2.9 cm in maximum diameter. Normal appearing uterus. Other: Small umbilical and infraumbilical hernias containing fat. Small left inguinal hernia containing fat. Musculoskeletal: Interval diffuse heterogeneous bone sclerosis. IMPRESSION: 1. Increased size of bilateral ovarian masses, especially on the left. 2. Interval diffuse bony metastatic disease. 3. Progressive metastatic bilateral pelvic and inguinal lymphadenopathy.  4. Increased size of a left adrenal metastasis. 5. Interval nodular densities at the lung bases suspicious for metastases. 6. Interval mild right hydronephrosis and hydroureter to the level of the ureterovesical junction. This could be due to recent stone passage, obstruction by wall thickening of the bladder or a nonvisualized obstructing ureteral metastasis. Electronically Signed   By: Claudie Revering M.D.   On: 02/20/2021 18:28   DG CHEST PORT 1 VIEW  Result Date: 02/20/2021 CLINICAL DATA:  History of gastric and ovarian cancer. Fatigue, nausea, vomiting, diarrhea EXAM: PORTABLE CHEST 1 VIEW COMPARISON:  12/31/2020 FINDINGS: Right Port-A-Cath in place with the tip at the cavoatrial junction. Heart is borderline in size. Mild vascular congestion. No confluent airspace opacities, effusions or overt edema. No acute bony abnormality. IMPRESSION: Borderline heart size.  Mild vascular congestion. Electronically Signed   By: Rolm Baptise M.D.   On: 02/20/2021 21:30      Sharief Wainwright T. [place]  If 7PM-7AM, please contact  night-coverage [email or web address] 02/21/2021, 1:19 PM

## 2021-02-21 NOTE — Progress Notes (Signed)
Date and time results received: 02/21/21 0835 (use smartphrase ".now" to insert current time)  Test: hemoglobin Critical Value: 6.2  Name of Provider Notified: Cyndia Skeeters, MD  Orders Received? Or Actions Taken?:  Awaiting new orders.

## 2021-02-21 NOTE — Progress Notes (Addendum)
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 -Bladder neck, urethra, cervical biopsies 12/17/2020 -MSS, tumor mutation burden 4, K-ras amplification, PD-L1 tumor proportion score-0 -[facility] -90% intestinal malignancy (colorectal adenocarcinoma 85%) small intestine adenocarcinoma less than 5%, gastroesophageal adenocarcinoma not excluded -12/05/2020 MRI of the pelvis-poorly marginated enhancing 3.7 x 2.8 x 3.0 cm mass centered at the urethra, solid avidly enhancing bilateral ovarian masses, poorly marginated enhancing 2.4 x 2.3 x 3.0 cm mass in the left uterine cervix, mild to moderate bilateral common iliac, bilateral external iliac, and bilateral inguinal lymphadenopathy, diffuse patchy confluent nodular replacement of the pelvic osseous structures. -12/13/2020 CEA 1864, CA125 29.2 -12/16/2020 CT abdomen/pelvis-rapidly increasing size of the left ovary with some adjacent ascites, enlarging masses elsewhere, masslike area of the cervix and potentially within a urethral diverticulum, well-circumscribed left adrenal lesion measuring 3.2 x 2.6 cm, heterogeneous pattern of subtle sclerosis and lucency in the spine. -12/16/2020 MRI of the lumbar spine-diffusely abnormal appearance of the bone marrow throughout the visualized lumbar spine and pelvis highly suspicious for diffuse osseous metastatic disease, retroperitoneal/iliac adenopathy with left larger than right adnexal masses. -12/17/2020 CT chest-no acute intrathoracic pathology -Upper endoscopy 12/22/2020-gastritis, gastric nodule biopsy-adenocarcinoma, poorly differentiated with signet ring morphology -Colonoscopy 12/22/2020-polyps removed from the ascending and sigmoid colon, extrinsic compression of the sigmoid colon-tubulovillous adenoma without high-grade dysplasia, tubular adenoma and hyperplastic polyps -12/27/2020 cycle #1 FOLFOX -01/10/2021  cycle #2 FOLFOX -01/27/2021 cycle 3 FOLFOX, oxaliplatin dose reduced -02/20/2021 CT abdomen/pelvis without contrast-increased size of the bilateral ovarian masses, interval diffuse bony metastatic disease, progressive metastatic bilateral pelvic and inguinal lymphadenopathy, increased size of the left adrenal metastasis, interval nodular densities at the lung bases suspicious for metastases, interval mild right hydronephrosis and hydroureter 2.  Abdominal pain secondary #1 3.  Constipation 4.  Normocytic anemia 5.  Leukocytosis 6.  Protein calorie malnutrition 7.  Obstructive sleep apnea 8.  Diabetes mellitus 9.  Hypertension 10.  Possible pulmonary hypertension noted on CT chest 11.  Hospital admission 02/20/2021-intractable nausea and vomiting 12.  Right hydronephrosis and hydroureter  Erin [name] was admitted due to intractable nausea and vomiting.  She appears improved this morning with IV fluids and antiemetics.  Etiology of her nausea and vomiting is not clear.  She was noted to have hydroureter and hydronephrosis on CT scan.  Unclear if symptoms are related to UTI/hydroureter and hydronephrosis.  Recommend obtaining a UA and culture and sensitivity.  The patient was admitted with anemia and has received 1 unit PRBCs.  Her hemoglobin remains low this morning.  Recommend that she receive PRBC transfusion to maintain hemoglobin above 7.  She also has mild thrombocytopenia likely related to recent chemotherapy but no platelet transfusion is needed at this time.  Her imaging studies have been reviewed and discussed with her.  CT findings are concerning for disease progression.  She will be reevaluated as an outpatient to discuss treatment options.  Recommendations: 1.  Obtain UA and C&S, start antibiotics if there is evidence of a urinary tract infection 2.  Continue IV fluids and antiemetics 3.  Transfuse PRBCs for hemoglobin less than 7 4.  Increase ambulation 5.  Monitor renal function  and will consider urology consult for hydroureter/hydronephrosis. 6.  Check CEA 7.  Call oncology as needed over the weekend, I will see her 02/24/2021  [name], [name], [name], [name] Erin [name] was interviewed and examined.  She was admitted yesterday with intractable nausea and vomiting.  The etiology  of the nausea is unclear.  I discussed the CT findings with her.  There is radiologic evidence of disease progression following 3 cycles of FOLFOX.  However sclerosis of bone lesions could indicate a response to therapy.  We discussed treatment options including a trial of salvage chemotherapy (likely FOLFIRI) versus comfort care.  Erin [name] indicated she wishes to continue treatment of the cancer.  We will plan for salvage chemotherapy if her performance status improves.  The nausea may be related to obstruction of the right ureter, tumor in the abdomen, or a urinary tract infection.  The nausea is partially improved today.  The anemia secondary to chronic disease, chemotherapy, and tumor involving the bone marrow.  I recommend continuing supportive care with intravenous hydration, antiemetics, and transfusion support.  Urology can be consulted for persistent nausea or a rise in the creatinine.  I reviewed the CT images  She has a low-grade fever, potentially related to a urinary tract infection.  I recommend starting antibiotics if the urinalysis is consistent with an infection.  The fever could also be related to tumor fever.  Please call oncology as needed over the weekend.  I will check on her 02/24/2021.  Outpatient follow-up will be scheduled at the Cancer center.  I was present for greater than 50% of today's visit.  I performed medical decision making.   SUBJECTIVE: Erin [name] is followed by our practice for metastatic adenocarcinoma, likely gastric or colorectal cancer.  She has been receiving systemic chemotherapy with FOLFOX.  Last cycle was given on 01/27/2021.   Oxaliplatin was dose reduced.  She was seen in our office yesterday due to ongoing nausea and vomiting.  She has also been experiencing weight loss.  She was found to be anemic with a hemoglobin of 6 and was tachycardic.  She was referred to the emergency department for admission.  This morning, she denies abdominal pain.  States that she has been able to keep down some water.  She also states that she was able to eat a couple bites of bacon this morning.  She has not vomited since eating this.  Oncology History  Gastric cancer ([place])  12/27/2020 Initial Diagnosis   Gastric cancer ([place])   12/27/2020 -  Chemotherapy   Patient is on Treatment Plan : GASTRIC FOLFOX q14d x 12 cycles      PHYSICAL EXAMINATION:  Vitals:   02/21/21 0209 02/21/21 0650  BP:  138/78  Pulse: (!) 111 (!) 110  Resp: 18   Temp:  (!) 100.4 F (38 C)  SpO2: 95% 100%   Filed Weights   02/20/21 1718 02/20/21 1723 02/21/21 0500  Weight: 103.9 kg 99.8 kg 106.6 kg    Intake/Output from previous day: 01/12 0701 - 01/13 0700 In: 2711.6 [P.O.:120; I.V.:253.1; Blood:945; IV Piggyback:1393.5] Out: -   Physical Exam Constitutional:      General: She is not in acute distress. HENT:     Mouth/Throat:     Mouth: Mucous membranes are moist.  Eyes:     General: No scleral icterus. Cardiovascular:     Rate and Rhythm: Tachycardia present.  Pulmonary:     Effort: Pulmonary effort is normal. No respiratory distress.     Breath sounds: Normal breath sounds.  Abdominal:     Palpations: Abdomen is soft.     Tenderness: There is no abdominal tenderness.  Skin:    General: Skin is warm and dry.  Neurological:     Mental Status: She is alert and oriented  to person, place, and time.  Psychiatric:        Mood and Affect: Mood normal.    LABORATORY DATA:  I have reviewed the data as listed CMP Latest Ref Rng & Units 02/20/2021 02/11/2021 01/27/2021  Glucose 70 - 99 mg/dL 195(H) 305(H) 192(H)  BUN 6 - 20 mg/dL 16 24(H)  27(H)  Creatinine 0.44 - 1.00 mg/dL 0.91 1.24(H) 1.36(H)  Sodium 135 - 145 mmol/L 145 137 137  Potassium 3.5 - 5.1 mmol/L 2.7(LL) 2.6(LL) 2.9(L)  Chloride 98 - 111 mmol/L 108 97(L) 99  CO2 22 - 32 mmol/L '24 26 26  ' Calcium 8.9 - 10.3 mg/dL 8.3(L) 9.2 9.5  Total Protein 6.5 - 8.1 g/dL 6.1(L) 6.8 6.8  Total Bilirubin 0.3 - 1.2 mg/dL 1.1 1.3(H) 1.1  Alkaline Phos 38 - 126 U/L 269(H) 425(H) 437(H)  AST 15 - 41 U/L 13(L) 16 26  ALT 0 - 44 U/L <5 <5 9    Lab Results  Component Value Date   WBC 13.3 (H) 02/20/2021   HGB 6.0 (LL) 02/20/2021   HCT 19.5 (L) 02/20/2021   MCV 92.4 02/20/2021   PLT 121 (L) 02/20/2021   NEUTROABS 11.7 (H) 02/20/2021    Lab Results  Component Value Date   CEA1 1,864.09 (H) 12/13/2020   CEA 617.97 (H) 02/11/2021    CT Abdomen Pelvis Wo Contrast  Result Date: 02/20/2021 CLINICAL DATA:  Acute, non localized abdominal pain. Nausea, vomiting, diarrhea and fatigue for the past week. Receiving chemotherapy for gastric and ovarian cancer. EXAM: CT ABDOMEN AND PELVIS WITHOUT CONTRAST TECHNIQUE: Multidetector CT imaging of the abdomen and pelvis was performed following the standard protocol without IV contrast. RADIATION DOSE REDUCTION: This exam was performed according to the departmental dose-optimization program which includes automated exposure control, adjustment of the mA and/or kV according to patient size and/or use of iterative reconstruction technique. COMPARISON:  Chest CT dated 12/17/2020. Abdomen and pelvis CT dated 12/16/2020. FINDINGS: Lower chest: Mildly enlarged heart. Small amount of residual atelectasis/scarring in the right lower lobe, including a somewhat oval wedge-shaped area at the posterior right lung base which is significantly smaller. Interval visualization of a 5 mm nodular density at the right lateral costophrenic angle on image number 20/6. There is a similar area more anteriorly on image number 22/6. There is also an interval 11 x 8 mm sub solid  nodular density in the right middle lobe on image number 9/6. There is also an interval nodular density in the left lower lobe with eccentric cavitation measuring 11 x 7 mm on image number 8/6. Hepatobiliary: No focal liver abnormality is seen. No gallstones, gallbladder wall thickening, or biliary dilatation. Pancreas: Unremarkable. No pancreatic ductal dilatation or surrounding inflammatory changes. Spleen: Normal in size without focal abnormality. Adrenals/Urinary Tract: An exophytic posterior left adrenal mass is again demonstrated. This measures 3.2 x 2.8 cm on image number 19/2, previously 3.2 x 2.6 cm. Stable normal appearing right adrenal gland. Interval mild dilatation of the right renal collecting system and ureter to the level of the ureterovesical junction with no obstructing stone or mass visualized. Stable normal appearing left kidney and ureter. The urinary bladder is poorly distended with mild to moderate diffuse wall thickening. Stomach/Bowel: A small hiatal hernia is unchanged. Unremarkable small bowel and colon. Normal appearing appendix. Vascular/Lymphatic: Bilateral enlarged pelvic and inguinal lymph nodes with progression. The largest is a left pelvic sidewall node with a short axis diameter of 17 mm on image number 75/2, previously 14 mm. Reproductive:  A large left adnexal mass is again demonstrated with an interval increase in size. This measures 13.0 x 8.2 cm on image number 64/2, previously 10.8 x 8.8 cm. A right adnexal mass is again demonstrated, currently measuring 3.4 x 2.7 cm on image number 67/2, previously 2.9 cm in maximum diameter. Normal appearing uterus. Other: Small umbilical and infraumbilical hernias containing fat. Small left inguinal hernia containing fat. Musculoskeletal: Interval diffuse heterogeneous bone sclerosis. IMPRESSION: 1. Increased size of bilateral ovarian masses, especially on the left. 2. Interval diffuse bony metastatic disease. 3. Progressive metastatic  bilateral pelvic and inguinal lymphadenopathy. 4. Increased size of a left adrenal metastasis. 5. Interval nodular densities at the lung bases suspicious for metastases. 6. Interval mild right hydronephrosis and hydroureter to the level of the ureterovesical junction. This could be due to recent stone passage, obstruction by wall thickening of the bladder or a nonvisualized obstructing ureteral metastasis. Electronically Signed   By: Claudie Revering M.D.   On: 02/20/2021 18:28   DG CHEST PORT 1 VIEW  Result Date: 02/20/2021 CLINICAL DATA:  History of gastric and ovarian cancer. Fatigue, nausea, vomiting, diarrhea EXAM: PORTABLE CHEST 1 VIEW COMPARISON:  12/31/2020 FINDINGS: Right Port-A-Cath in place with the tip at the cavoatrial junction. Heart is borderline in size. Mild vascular congestion. No confluent airspace opacities, effusions or overt edema. No acute bony abnormality. IMPRESSION: Borderline heart size.  Mild vascular congestion. Electronically Signed   By: Rolm Baptise M.D.   On: 02/20/2021 21:30     Future Appointments  Date Time Provider [place]  02/25/2021  9:15 AM [facility] PHLEBOTOMIST [facility] None  02/25/2021  9:30 AM [facility] FLUSH ROOM [facility] None  02/25/2021 10:00 AM Ladell Pier, MD [facility] None  02/25/2021 11:00 AM [facility] CHAIR 1 [facility] None  02/27/2021  1:00 PM [facility] FLUSH ROOM [facility] None      LOS: 1 day

## 2021-02-21 NOTE — TOC Progression Note (Signed)
Oncology Discharge Planning Admission Note  [facility] Address: [address], [place], [place] [address] Hours of Operation:  8am - 5pm, Monday - Friday  Clinic Contact Information:  [phone]  Oncology Care Team: Medical Oncologist:  Dr. Carver Fila Curico,NP  to inform that the oncology provider Dr. Benay Spice of this hospital admission dated 02/20/21, and the cancer center will follow Arman Filter inpatient care to assist with discharge planning as indicated by the oncologist.  We will reach out closer to discharge date to arrange follow up care.  Disclaimer:  This [place] note does not imply a formal consult request has been made by the admitting attending for this admission or there will be an inpatient consult completed by oncology.  Please request oncology consults as per standard process as indicated.

## 2021-02-21 NOTE — Progress Notes (Signed)
Initial Nutrition Assessment  DOCUMENTATION CODES:   Severe malnutrition in context of chronic illness, Morbid obesity  INTERVENTION:  - will order Boost Breeze TID, each supplement provides 250 kcal and 9 grams of protein. - will order 30 ml Prosource Plus BID, each supplement provides 100 kcal and 15 grams protein.  - will order 1 tablet multivitamin with minerals/day.  - will order 72 hour Calorie Count. And follow-up with results on 1/16.   NUTRITION DIAGNOSIS:   Severe Malnutrition related to chronic illness, cancer and cancer related treatments as evidenced by mild fat depletion, mild muscle depletion, percent weight loss, energy intake < 75% for > or equal to 1 month.  GOAL:   Patient will meet greater than or equal to 90% of their needs  MONITOR:   PO intake, Supplement acceptance, Labs, Weight trends, I & O's  REASON FOR ASSESSMENT:   Malnutrition Screening Tool   ASSESSMENT:   56 y.o. female with medical history of poorly differentiated metastatic adenocarcinoma with suspicion of gastric primary, undergoing chemo (folfox, last: 01/27/21), OSA, OHS, gastric cancer, HTN, and DM. she presented to the ED due to N/V/D, abdominal cramping, and weakness.  Patient laying in bed with no visitors present at the time of RD visit this AM. Patient was seen by [place] RD on 02/12/21. The day following appt she was able to consume [employer] chicken noodle soup without issue. Then on 1/6 she tried soup again and she was unable to keep it down. She has mainly been trying things such as soup, [place] ice, water, and gatorade. She has had difficulty keeping much down over the past 1 week.  She does feel nauseated before vomiting. She reports having zofran and another antiemetic at home but they have not provided much relief.   Patient reports that she has been able to keep some water down this AM and took the first bite of a lemon italian ice as RD was leaving the room.   Weight  today is 235 lb and weight on 12/13/20 was 279 lb. This indicates 44 lb weight loss (15.8% body weight) in the past 2 months; significant for time frame.  RD able to talk with Med Onc NP on the phone. She reports patient let her know she had a few bites of bacon at breakfast (this was not relayed to RD by patient). Plan to monitor PO intakes through the weekend.    Labs reviewed; CBGs: 101 and 152 mg/dl, K: 2.9 mmol/l, Ca: 8.4 mg/dl, Mg: 1.6 mg/dl, Alk Phos elevated.  Medications reviewed; sliding scale novolog, 2 g IV Mg sulfate x1 run 1/13, 40 mg oral protonix/day, 40 mEq Klor-Con x2 doses 1/13, 10 mEq IV KCl x2 runs 1/12    NUTRITION - FOCUSED PHYSICAL EXAM:  Flowsheet Row Most Recent Value  Orbital Region Mild depletion  Upper Arm Region Moderate depletion  Thoracic and Lumbar Region Unable to assess  Buccal Region Mild depletion  Temple Region Mild depletion  Clavicle Bone Region Mild depletion  Clavicle and Acromion Bone Region Mild depletion  Scapular Bone Region Unable to assess  Dorsal Hand No depletion  Patellar Region Unable to assess  Anterior Thigh Region Unable to assess  Posterior Calf Region Unable to assess  Edema (RD Assessment) Unable to assess  Hair Reviewed  Eyes Reviewed  Mouth Reviewed  Skin Reviewed  Nails Reviewed       Diet Order:   Diet Order  DIET SOFT Room service appropriate? Yes; Fluid consistency: Thin  Diet effective now                   EDUCATION NEEDS:   No education needs have been identified at this time  Skin:  Skin Assessment: Reviewed RN Assessment  Last BM:  PTA/unknown  Height:   Ht Readings from Last 1 Encounters:  02/20/21 '5\' 4"'  (1.626 m)    Weight:   Wt Readings from Last 1 Encounters:  02/21/21 106.6 kg     Estimated Nutritional Needs:  Kcal:  2150-2350 kcal Protein:  115-130 grams Fluid:  >/= 2.2 L/day     Jarome Matin, MS, RD, LDN Inpatient Clinical Dietitian RD pager #  available in [place]  After hours/weekend pager # available in [facility]

## 2021-02-21 NOTE — TOC Progression Note (Signed)
Transition of Care [facility]) - Progression Note    Patient Details  Name: Erin [name] MRN: [id number] Date of Birth: 04/01/1965  Transition of Care [facility]) CM/SW Contact  Purcell Mouton, RN Phone Number: 02/21/2021, 2:49 PM  Clinical Narrative:    Oncology, CM following pt for discharge.   Expected Discharge Plan: Home/Self Care Barriers to Discharge: No Barriers Identified  Expected Discharge Plan and Services Expected Discharge Plan: Home/Self Care       Living arrangements for the past 2 months: Single Family Home                                       Social Determinants of Health (SDOH) Interventions    Readmission Risk Interventions Readmission Risk Prevention Plan 01/06/2021  Transportation Screening Complete  Medication Review [employer]) Complete  PCP or Specialist appointment within 3-5 days of discharge Complete  HRI or Home Care Consult Complete  SW Recovery Care/Counseling Consult Complete  Palliative Care Screening Not [place] Complete  Some recent data might be hidden

## 2021-02-21 NOTE — TOC Progression Note (Signed)
Transition of Care [facility]) - Progression Note    Patient Details  Name: Erin [name] MRN: [id number] Date of Birth: 14-Sep-1965  Transition of Care [facility]) CM/SW Contact  Purcell Mouton, RN Phone Number: 02/21/2021, 4:46 PM  Clinical Narrative:    Pt is active with [facility].   Expected Discharge Plan: Home/Self Care Barriers to Discharge: No Barriers Identified  Expected Discharge Plan and Services Expected Discharge Plan: Home/Self Care       Living arrangements for the past 2 months: Single Family Home                                       Social Determinants of Health (SDOH) Interventions    Readmission Risk Interventions Readmission Risk Prevention Plan 01/06/2021  Transportation Screening Complete  Medication Review [employer]) Complete  PCP or Specialist appointment within 3-5 days of discharge Complete  HRI or Home Care Consult Complete  SW Recovery Care/Counseling Consult Complete  Palliative Care Screening Not [place] Complete  Some recent data might be hidden

## 2021-02-22 ENCOUNTER — Inpatient Hospital Stay (HOSPITAL_COMMUNITY): Payer: 59

## 2021-02-22 DIAGNOSIS — Z515 Encounter for palliative care: Secondary | ICD-10-CM

## 2021-02-22 DIAGNOSIS — E119 Type 2 diabetes mellitus without complications: Secondary | ICD-10-CM | POA: Diagnosis not present

## 2021-02-22 DIAGNOSIS — C801 Malignant (primary) neoplasm, unspecified: Secondary | ICD-10-CM | POA: Diagnosis not present

## 2021-02-22 DIAGNOSIS — C799 Secondary malignant neoplasm of unspecified site: Secondary | ICD-10-CM | POA: Diagnosis not present

## 2021-02-22 DIAGNOSIS — D649 Anemia, unspecified: Secondary | ICD-10-CM | POA: Diagnosis not present

## 2021-02-22 DIAGNOSIS — R112 Nausea with vomiting, unspecified: Secondary | ICD-10-CM | POA: Diagnosis not present

## 2021-02-22 DIAGNOSIS — E43 Unspecified severe protein-calorie malnutrition: Secondary | ICD-10-CM

## 2021-02-22 LAB — COMPREHENSIVE METABOLIC PANEL
ALT: 6 U/L (ref 0–44)
AST: 19 U/L (ref 15–41)
Albumin: 2 g/dL — ABNORMAL LOW (ref 3.5–5.0)
Alkaline Phosphatase: 228 U/L — ABNORMAL HIGH (ref 38–126)
Anion gap: 7 (ref 5–15)
BUN: 20 mg/dL (ref 6–20)
CO2: 24 mmol/L (ref 22–32)
Calcium: 8.7 mg/dL — ABNORMAL LOW (ref 8.9–10.3)
Chloride: 104 mmol/L (ref 98–111)
Creatinine, Ser: 0.99 mg/dL (ref 0.44–1.00)
GFR, Estimated: >60 mL/min (ref >60)
Glucose, Bld: 113 mg/dL — ABNORMAL HIGH (ref 70–99)
Potassium: 3.2 mmol/L — ABNORMAL LOW (ref 3.5–5.1)
Sodium: 135 mmol/L (ref 135–145)
Total Bilirubin: 1.8 mg/dL — ABNORMAL HIGH (ref 0.3–1.2)
Total Protein: 5.8 g/dL — ABNORMAL LOW (ref 6.5–8.1)

## 2021-02-22 LAB — HEPATIC FUNCTION PANEL
ALT: 6 U/L (ref 0–44)
AST: 19 U/L (ref 15–41)
Albumin: 1.9 g/dL — ABNORMAL LOW (ref 3.5–5.0)
Alkaline Phosphatase: 227 U/L — ABNORMAL HIGH (ref 38–126)
Bilirubin, Direct: 0.4 mg/dL — ABNORMAL HIGH (ref 0.0–0.2)
Indirect Bilirubin: 1.1 mg/dL — ABNORMAL HIGH (ref 0.3–0.9)
Total Bilirubin: 1.5 mg/dL — ABNORMAL HIGH (ref 0.3–1.2)
Total Protein: 5.8 g/dL — ABNORMAL LOW (ref 6.5–8.1)

## 2021-02-22 LAB — CBC
HCT: 24 % — ABNORMAL LOW (ref 36.0–46.0)
HCT: 27.1 % — ABNORMAL LOW (ref 36.0–46.0)
Hemoglobin: 7.6 g/dL — ABNORMAL LOW (ref 12.0–15.0)
Hemoglobin: 8.8 g/dL — ABNORMAL LOW (ref 12.0–15.0)
MCH: 29.6 pg (ref 26.0–34.0)
MCH: 29.8 pg (ref 26.0–34.0)
MCHC: 31.7 g/dL (ref 30.0–36.0)
MCHC: 32.5 g/dL (ref 30.0–36.0)
MCV: 93.4 fL (ref 80.0–100.0)
MCV: 91.9 fL (ref 80.0–100.0)
Platelets: 69 10*3/uL — ABNORMAL LOW (ref 150–400)
Platelets: 55 10*3/uL — ABNORMAL LOW (ref 150–400)
RBC: 2.57 MIL/uL — ABNORMAL LOW (ref 3.87–5.11)
RBC: 2.95 MIL/uL — ABNORMAL LOW (ref 3.87–5.11)
RDW: 18.2 % — ABNORMAL HIGH (ref 11.5–15.5)
RDW: 17.5 % — ABNORMAL HIGH (ref 11.5–15.5)
WBC: 12.5 10*3/uL — ABNORMAL HIGH (ref 4.0–10.5)
WBC: 12.9 10*3/uL — ABNORMAL HIGH (ref 4.0–10.5)
nRBC: 0.3 % — ABNORMAL HIGH (ref 0.0–0.2)
nRBC: 0.2 % (ref 0.0–0.2)

## 2021-02-22 LAB — PREPARE RBC (CROSSMATCH)

## 2021-02-22 LAB — MAGNESIUM: Magnesium: 1.9 mg/dL (ref 1.7–2.4)

## 2021-02-22 LAB — GLUCOSE, CAPILLARY
Glucose-Capillary: 107 mg/dL — ABNORMAL HIGH (ref 70–99)
Glucose-Capillary: 116 mg/dL — ABNORMAL HIGH (ref 70–99)
Glucose-Capillary: 124 mg/dL — ABNORMAL HIGH (ref 70–99)
Glucose-Capillary: 102 mg/dL — ABNORMAL HIGH (ref 70–99)

## 2021-02-22 LAB — PHOSPHORUS: Phosphorus: 2.6 mg/dL (ref 2.5–4.6)

## 2021-02-22 MED ORDER — OXYCODONE HCL 5 MG PO TABS
15.0000 mg | ORAL_TABLET | ORAL | Status: DC | PRN
  Administered 2021-02-22 – 2021-02-25 (×8): 15 mg via ORAL
  Filled 2021-02-22 (×8): qty 3

## 2021-02-22 MED ORDER — SODIUM CHLORIDE 0.9% IV SOLUTION: Freq: Once | INTRAVENOUS | Status: AC

## 2021-02-22 MED ORDER — POLYETHYLENE GLYCOL 3350 17 G PO PACK
17.0000 g | PACK | Freq: Two times a day (BID) | ORAL | Status: AC
  Administered 2021-02-23: 17 g via ORAL
  Filled 2021-02-22 (×2): qty 1

## 2021-02-22 NOTE — Progress Notes (Signed)
[facility] PROGRESS NOTE    Progress Note  Erin [name]  YQM:[id number] DOB: [birth year] DOA: 02/20/2021 PCP: Merryl Hacker, No     Brief Narrative:   Erin [name] is an 56 y.o. 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  CT scan of the abdomen and pelvis showed increasing pulmonary mass with new bone metastases and increased in size of left adrenal metastases as well as along nodules    Assessment/Plan:   Severe anemia symptomatic anemia due to antineoplastic drug: Signs of overt bleeding. Post 3 unit of packed red blood cells her hemoglobin this morning is 7.6. Her hemoglobin has remained anywhere from 7.0-7.6. Discontinue heparin. Symptomatic function panel and haptoglobin  Poorly differentiated metastatic adenocarcinoma of lung clear primary: Imaging showed progression of malignancy. Oncology was notified discussed with the patient treatment for treatment versus moving towards comfort care. The patient decided to try FOLFRI treatment.  Started once her performance status improved.  Nausea vomiting abdominal pain: Likely due to malignancy versus infectious etiology. T-max of 99.5, leukocytosis is improving off antibiotics. Increase her narcotics she has not had a bowel movement in 4 days. We will start her on MiraLAX p.o. twice daily.  Fairly controlled diabetes mellitus type 2: With an A1c of 7.1, discontinue metformin continue sliding scale insulin.  Essential hypertension: Normotensive continue amlodipine and Coreg.  Goals of  care: Patient remains full code.  Cancer related pain: Continue current home regimen.  Leukocytosis: Likely due to malignancy no clear source of infection she has remained afebrile without antibiotics.  Chronic thrombocytopenia likely due to antineoplastic drug: Mild drop this morning 69, 1 and discontinue Lovenox.   DVT prophylaxis: oncology Family Communication:none Status is: Inpatient  Remains inpatient appropriate because: Abdominal pain nausea vomiting    Code Status:     Code Status Orders  (From admission, onward)           Start     Ordered   02/20/21 2023  Full code  Continuous        02/20/21 2025           Code Status History     Date Active Date Inactive Code Status Order ID Comments User Context   12/16/2020 2018 01/14/2021 2022 Full Code [id number]  Orene Desanctis, DO ED         IV Access:   Peripheral IV   Procedures and diagnostic studies:   CT Abdomen Pelvis Wo Contrast  Result Date: 02/20/2021 CLINICAL DATA:  Acute, non localized abdominal pain. Nausea, vomiting, diarrhea and fatigue for the past week. Receiving chemotherapy for gastric and ovarian cancer. EXAM: CT ABDOMEN AND PELVIS WITHOUT CONTRAST TECHNIQUE: Multidetector CT imaging of the abdomen and pelvis was performed following the standard protocol without IV contrast. RADIATION DOSE REDUCTION: This exam was performed according to the departmental dose-optimization program which includes automated exposure control, adjustment of the mA and/or kV according to patient size and/or use of iterative reconstruction technique. COMPARISON:  Chest CT dated 12/17/2020. Abdomen and pelvis CT dated 12/16/2020. FINDINGS: Lower chest: Mildly enlarged heart. Small amount of residual atelectasis/scarring  in the right lower lobe, including a somewhat oval wedge-shaped area at the posterior right lung base which is significantly smaller. Interval visualization of a 5 mm nodular density at the right  lateral costophrenic angle on image number 20/6. There is a similar area more anteriorly on image number 22/6. There is also an interval 11 x 8 mm sub solid nodular density in the right middle lobe on image number 9/6. There is also an interval nodular density in the left lower lobe with eccentric cavitation measuring 11 x 7 mm on image number 8/6. Hepatobiliary: No focal liver abnormality is seen. No gallstones, gallbladder wall thickening, or biliary dilatation. Pancreas: Unremarkable. No pancreatic ductal dilatation or surrounding inflammatory changes. Spleen: Normal in size without focal abnormality. Adrenals/Urinary Tract: An exophytic posterior left adrenal mass is again demonstrated. This measures 3.2 x 2.8 cm on image number 19/2, previously 3.2 x 2.6 cm. Stable normal appearing right adrenal gland. Interval mild dilatation of the right renal collecting system and ureter to the level of the ureterovesical junction with no obstructing stone or mass visualized. Stable normal appearing left kidney and ureter. The urinary bladder is poorly distended with mild to moderate diffuse wall thickening. Stomach/Bowel: A small hiatal hernia is unchanged. Unremarkable small bowel and colon. Normal appearing appendix. Vascular/Lymphatic: Bilateral enlarged pelvic and inguinal lymph nodes with progression. The largest is a left pelvic sidewall node with a short axis diameter of 17 mm on image number 75/2, previously 14 mm. Reproductive: A large left adnexal mass is again demonstrated with an interval increase in size. This measures 13.0 x 8.2 cm on image number 64/2, previously 10.8 x 8.8 cm. A right adnexal mass is again demonstrated, currently measuring 3.4 x 2.7 cm on image number 67/2, previously 2.9 cm in maximum diameter. Normal appearing uterus. Other: Small umbilical and infraumbilical hernias containing fat. Small left inguinal hernia containing fat. Musculoskeletal: Interval diffuse heterogeneous bone sclerosis.  IMPRESSION: 1. Increased size of bilateral ovarian masses, especially on the left. 2. Interval diffuse bony metastatic disease. 3. Progressive metastatic bilateral pelvic and inguinal lymphadenopathy. 4. Increased size of a left adrenal metastasis. 5. Interval nodular densities at the lung bases suspicious for metastases. 6. Interval mild right hydronephrosis and hydroureter to the level of the ureterovesical junction. This could be due to recent stone passage, obstruction by wall thickening of the bladder or a nonvisualized obstructing ureteral metastasis. Electronically Signed   By: Claudie Revering M.D.   On: 02/20/2021 18:28   DG CHEST PORT 1 VIEW  Result Date: 02/20/2021 CLINICAL DATA:  History of gastric and ovarian cancer. Fatigue, nausea, vomiting, diarrhea EXAM: PORTABLE CHEST 1 VIEW COMPARISON:  12/31/2020 FINDINGS: Right Port-A-Cath in place with the tip at the cavoatrial junction. Heart is borderline in size. Mild vascular congestion. No confluent airspace opacities, effusions or overt edema. No acute bony abnormality. IMPRESSION: Borderline heart size.  Mild vascular congestion. Electronically Signed   By: Rolm Baptise M.D.   On: 02/20/2021 21:30     Medical Consultants:   None.   Subjective:    BRITANEY ESPAILLAT continues to have abdominal pelvis, no bowel movement  Objective:    Vitals:   02/21/21 2027 02/21/21 2223 02/22/21 0500 02/22/21 0525  BP: (!) 151/76   119/64  Pulse: 96   (!) 104  Resp:  19  19  Temp: 99.5 F (37.5 C)   97.8 F (36.6 C)  TempSrc: Oral   Oral  SpO2: 96%   93%  Weight:  106.3 kg   Height:       SpO2: 93 % O2 Flow Rate (L/min): 1 L/min   Intake/Output Summary (Last 24 hours) at 02/22/2021 0855 Last data filed at 02/22/2021 0600 Gross per 24 hour  Intake 687.5 ml  Output 600 ml  Net 87.5 ml   Filed Weights   02/20/21 1723 02/21/21 0500 02/22/21 0500  Weight: 99.8 kg 106.6 kg 106.3 kg    Exam: General exam: In no acute  distress. Respiratory system: Good air movement and clear to auscultation. Cardiovascular system: S1 & S2 heard, RRR. No JVD. Gastrointestinal system: Abdomen is nondistended, soft and nontender.  Extremities: No pedal edema. Skin: No rashes, lesions or ulcers Psychiatry: Judgement and insight appear normal. Mood & affect appropriate.    Data Reviewed:    Labs: Basic Metabolic Panel: Recent Labs  Lab 02/20/21 1456 02/21/21 0756 02/22/21 0500  NA 145 139 135  K 2.7* 2.9* 3.2*  CL 108 108 104  CO2 24 23 24   GLUCOSE 195* 106* 113*  BUN 16 16 20   CREATININE 0.91 0.96 0.99  CALCIUM 8.3* 8.4* 8.7*  MG 1.5* 1.6* 1.9  PHOS  --   --  2.6   GFR Estimated Creatinine Clearance: 77.2 mL/min (by C-G formula based on SCr of 0.99 mg/dL). Liver Function Tests: Recent Labs  Lab 02/20/21 1456 02/21/21 0756 02/22/21 0500  AST 13* 17 19  ALT 5 6 6   ALKPHOS 269* 229* 228*  BILITOT 1.1 1.2 1.8*  PROT 6.1* 5.7* 5.8*  ALBUMIN 2.6* 2.0* 2.0*   No results for input(s): LIPASE, AMYLASE in the last 168 hours. No results for input(s): AMMONIA in the last 168 hours. Coagulation profile No results for input(s): INR, PROTIME in the last 168 hours. COVID-19 Labs  No results for input(s): DDIMER, FERRITIN, LDH, CRP in the last 72 hours.  Lab Results  Component Value Date   SARSCOV2NAA NEGATIVE 02/20/2021   [place] NEGATIVE 12/16/2020    CBC: Recent Labs  Lab 02/20/21 1456 02/21/21 0756 02/21/21 1003 02/21/21 1949 02/22/21 0500  WBC 13.3* 12.5*  --   --  12.5*  NEUTROABS 11.7*  --   --   --   --   HGB 6.0* 6.2* 6.5* 8.1* 7.6*  HCT 19.5* 20.0* 21.1* 25.1* 24.0*  MCV 92.4 91.7  --   --  93.4  PLT 121* 106*  --   --  69*   Cardiac Enzymes: No results for input(s): CKTOTAL, CKMB, CKMBINDEX, TROPONINI in the last 168 hours. BNP (last 3 results) No results for input(s): PROBNP in the last 8760 hours. CBG: Recent Labs  Lab 02/21/21 0727 02/21/21 1149 02/21/21 1610  02/21/21 2029 02/22/21 0716  GLUCAP 101* 152* 94 123* 107*   D-Dimer: No results for input(s): DDIMER in the last 72 hours. Hgb A1c: No results for input(s): HGBA1C in the last 72 hours. Lipid Profile: No results for input(s): CHOL, HDL, LDLCALC, TRIG, CHOLHDL, LDLDIRECT in the last 72 hours. Thyroid function studies: No results for input(s): TSH, T4TOTAL, T3FREE, THYROIDAB in the last 72 hours.  Invalid input(s): FREET3 Anemia work up: No results for input(s): VITAMINB12, FOLATE, FERRITIN, TIBC, IRON, RETICCTPCT in the last 72 hours. Sepsis Labs: Recent Labs  Lab 02/20/21 1456 02/21/21 0756 02/22/21 0500  WBC 13.3* 12.5* 12.5*   Microbiology Recent Results (from the past 240 hour(s))  Resp Panel by RT-PCR (Flu A&B, Covid) Nasopharyngeal Swab     Status: None   Collection Time: 02/20/21  5:46 PM  Specimen: Nasopharyngeal Swab; Nasopharyngeal(NP) swabs in vial transport medium  Result Value Ref Range Status   SARS Coronavirus 2 by RT PCR NEGATIVE NEGATIVE Final    Comment: (NOTE) SARS-CoV-2 target nucleic acids are NOT DETECTED.  The SARS-CoV-2 RNA is generally detectable in upper respiratory specimens during the acute phase of infection. The lowest concentration of SARS-CoV-2 viral copies this assay can detect is 138 copies/mL. A negative result does not preclude SARS-Cov-2 infection and should not be used as the sole basis for treatment or other patient management decisions. A negative result may occur with  improper specimen collection/handling, submission of specimen other than nasopharyngeal swab, presence of viral mutation(s) within the areas targeted by this assay, and inadequate number of viral copies(<138 copies/mL). A negative result must be combined with clinical observations, patient history, and epidemiological information. The expected result is Negative.  Fact Sheet for Patients:  EntrepreneurPulse.com.au  Fact Sheet for Healthcare  Providers:  IncredibleEmployment.be  This test is no t yet approved or cleared by the [place] FDA and  has been authorized for detection and/or diagnosis of SARS-CoV-2 by FDA under an Emergency Use Authorization (EUA). This EUA will remain  in effect (meaning this test can be used) for the duration of the COVID-19 declaration under Section 564(b)(1) of the Act, 21 U.S.C.section 360bbb-3(b)(1), unless the authorization is terminated  or revoked sooner.       Influenza A by PCR NEGATIVE NEGATIVE Final   Influenza B by PCR NEGATIVE NEGATIVE Final    Comment: (NOTE) The Xpert Xpress SARS-CoV-2/FLU/RSV plus assay is intended as an aid in the diagnosis of influenza from Nasopharyngeal swab specimens and should not be used as a sole basis for treatment. Nasal washings and aspirates are unacceptable for Xpert Xpress SARS-CoV-2/FLU/RSV testing.  Fact Sheet for Patients: EntrepreneurPulse.com.au  Fact Sheet for Healthcare Providers: IncredibleEmployment.be  This test is not yet approved or cleared by the [place] FDA and has been authorized for detection and/or diagnosis of SARS-CoV-2 by FDA under an Emergency Use Authorization (EUA). This EUA will remain in effect (meaning this test can be used) for the duration of the COVID-19 declaration under Section 564(b)(1) of the Act, 21 U.S.C. section 360bbb-3(b)(1), unless the authorization is terminated or revoked.  Performed at [facility], [place] [address] [place], [place] [address]      Medications:    (feeding supplement) PROSource Plus  30 mL Oral BID BM   amLODipine  5 mg Oral Daily   carvedilol  6.25 mg Oral BID WC   Chlorhexidine Gluconate Cloth  6 each Topical Daily   feeding supplement  1 Container Oral TID BM   insulin aspart  0-15 Units Subcutaneous TID WC   insulin aspart  0-5 Units Subcutaneous QHS   multivitamin with minerals  1 tablet  Oral Daily   pantoprazole  40 mg Oral Daily   sodium chloride flush  10-40 mL Intracatheter Q12H   sodium chloride flush  3 mL Intravenous Q12H   Continuous Infusions:    LOS: 2 days   Charlynne Cousins  [facility]  02/22/2021, 8:55 AM

## 2021-02-23 DIAGNOSIS — I10 Essential (primary) hypertension: Secondary | ICD-10-CM | POA: Diagnosis not present

## 2021-02-23 DIAGNOSIS — D649 Anemia, unspecified: Secondary | ICD-10-CM | POA: Diagnosis not present

## 2021-02-23 DIAGNOSIS — R112 Nausea with vomiting, unspecified: Secondary | ICD-10-CM | POA: Diagnosis not present

## 2021-02-23 DIAGNOSIS — G893 Neoplasm related pain (acute) (chronic): Secondary | ICD-10-CM | POA: Diagnosis not present

## 2021-02-23 LAB — CBC
HCT: 25.8 % — ABNORMAL LOW (ref 36.0–46.0)
Hemoglobin: 8.5 g/dL — ABNORMAL LOW (ref 12.0–15.0)
MCH: 30.4 pg (ref 26.0–34.0)
MCHC: 32.9 g/dL (ref 30.0–36.0)
MCV: 92.1 fL (ref 80.0–100.0)
Platelets: 41 10*3/uL — ABNORMAL LOW (ref 150–400)
RBC: 2.8 MIL/uL — ABNORMAL LOW (ref 3.87–5.11)
RDW: 17.9 % — ABNORMAL HIGH (ref 11.5–15.5)
WBC: 12.1 10*3/uL — ABNORMAL HIGH (ref 4.0–10.5)
nRBC: 0.2 % (ref 0.0–0.2)

## 2021-02-23 LAB — GLUCOSE, CAPILLARY
Glucose-Capillary: 110 mg/dL — ABNORMAL HIGH (ref 70–99)
Glucose-Capillary: 154 mg/dL — ABNORMAL HIGH (ref 70–99)
Glucose-Capillary: 91 mg/dL (ref 70–99)
Glucose-Capillary: 92 mg/dL (ref 70–99)

## 2021-02-23 LAB — CEA: CEA: 440 ng/mL — ABNORMAL HIGH (ref 0.0–4.7)

## 2021-02-23 LAB — HAPTOGLOBIN: Haptoglobin: 387 mg/dL — ABNORMAL HIGH (ref 33–346)

## 2021-02-23 LAB — HIV-1 RNA QUANT-NO REFLEX-BLD

## 2021-02-23 MED ORDER — SORBITOL 70 % SOLN
30.0000 mL | Freq: Once | Status: AC
  Administered 2021-02-23: 30 mL via ORAL
  Filled 2021-02-23: qty 30

## 2021-02-23 NOTE — Progress Notes (Signed)
PT Cancellation Note  Patient Details Name: KIANNA BILLET MRN: [id number] DOB: August 04, 1965   Cancelled Treatment:    Reason Eval/Treat Not Completed: Other (comment)Pt had pain meds ~15 mins before attempt to evaluate and pt feeling too groggy to participate. She was very pleasant, but sleepy.  Her aunt was present and in agreement.  Will check back as schedule permits.   Galen Manila 02/23/2021, 2:30 PM

## 2021-02-23 NOTE — Consult Note (Signed)
Palliative Care Consult Note                                  Date: 02/23/2021   Patient Name: Erin [name]  DOB: 11/12/1965  MRN: [id number]  Age / Sex: 56 y.o., female  PCP: Pcp, No Referring Physician: Charlynne Cousins, MD  Reason for Consultation: Establishing goals of care  HPI/Patient Profile: 56 y.o. female with medical history significant for poorly differentiated metastatic adenocarcinoma, OSA, OHS, gastric cancer, hypertension, diabetes who was admitted from oncology office with nausea, vomiting and abdominal cramping with diarrhea.  She has been undergoing treatment with FOLFOX but CT scan shows signs of progression and she met with Dr. Benay Spice with consideration for a focus on comfort versus trial of salvage chemotherapy with FOLFIRI.  Palliative consulted for goals of care.  I met today with Erin [name].  She is known to me from prior encounters.  We discussed clinical course as well as wishes moving forward in regard to care plan this hospitalization.  Concepts specific to code status and advance care planning discussed.  We discussed difference between a aggressive medical intervention path and a palliative, comfort focused care path.  Values and goals of care important to patient and family were attempted to be elicited.  We discussed that she desires to continue with trial of disease modifying therapy with salvage chemotherapy as she discussed with Dr. Benay Spice.  We discussed goal of any medical interventions being to add as much time and quality to her life is possible.  We talked about the fact that sometimes this is through further disease modifying therapy and sometimes it is through aggressive symptom management in order to maintain functional status as long as possible.  She reports she will continue to consider and discuss further with Dr. Benay Spice at follow-up appointment.  Questions and concerns addressed.   PMT  will continue to support holistically.   Past Medical History:  Diagnosis Date   Anxiety    Constipation    Depression    GERD (gastroesophageal reflux disease)    Pt states she no longer needs meds   Hyperlipidemia    Hypertension    Menorrhagia    OSA (obstructive sleep apnea) 11/25/2017   OSA on CPAP    Other fatigue    Shortness of breath on exertion    Type 2 diabetes mellitus ([place])    Vitamin D deficiency    Wears glasses     Life Review (as per previous palliative medicine consult by [facility]): As her husband passed in 2009.  She does not have any children.  She has 1 sister but is more close with her niece that was visiting her today.  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  However, she fell and had rotator cuff repair and was working with PT trying to get back to work.  She also has a part-time job which she did not get into details.  However, in September when she discovered her pelvic region mass and required her to be out of work for further work-up.  She seems to be a fun-loving individual who loves life.  She likes to live, travel, have fun.  She does not drink alcohol.  When she goes out it is mostly with work friends.  She likes to cook and  eat as well enjoys going to concerts, specifically R&B singer "Joe".  She also enjoys traveling and will often visit family, particularly in [place].  Review of Systems  Constitutional:  Negative for activity change and fatigue.  Respiratory:  Negative for shortness of breath.   Cardiovascular:  Negative for chest pain.  Gastrointestinal:  Positive for abdominal pain.  Musculoskeletal:  Positive for arthralgias (Leg pain).   Objective:   Primary Diagnoses: Present on Admission:  Severe anemia  OSA (obstructive sleep apnea)  Obesity hypoventilation syndrome (HCC)  Metastatic adenocarcinoma of unknown origin ([place])   Hypokalemia  Essential hypertension  Cancer related pain  Adenocarcinoma ([place])   Physical Exam Vitals and nursing note reviewed.  Constitutional:      General: She is not in acute distress.    Appearance: She is obese. She is not ill-appearing or toxic-appearing.  HENT:     Head: Normocephalic and atraumatic.  Cardiovascular:     Rate and Rhythm: Normal rate.  Pulmonary:     Effort: Pulmonary effort is normal. No respiratory distress.     Breath sounds: Normal breath sounds. No wheezing or rhonchi.  Abdominal:     General: Abdomen is protuberant.     Palpations: Abdomen is soft.  Skin:    General: Skin is warm and dry.  Neurological:     General: No focal deficit present.     Mental Status: She is alert and oriented to person, place, and time.  Psychiatric:        Mood and Affect: Mood normal.        Behavior: Behavior normal.    Vital Signs:  BP 102/77 (BP Location: Right Arm)    Pulse 90    Temp 98.5 F (36.9 C) (Oral)    Resp 18    Ht '5\' 4"'  (1.626 m)    Wt 106.3 kg    SpO2 100%    BMI 40.23 kg/m   Palliative Assessment/Data: 90%    Advanced Care Planning:   Primary Decision Maker: PATIENT  Code Status/Advance Care Planning: Full code  A discussion was had today regarding advanced directives. Concepts specific to code status, artifical feeding and hydration, continued IV antibiotics and rehospitalization was had.  The difference between a aggressive medical intervention path and a palliative comfort care path for this patient at this time was had. She elects full scope of care, full code.  Invested in plan to continue with disease modifying therapy for as long as is to be offered.  Decisions/Changes to ACP: None today  Assessment & Plan:  SUMMARY OF RECOMMENDATIONS   Full code/full scope treatment She would like to follow-up with Dr. Benay Spice for reevaluation for salvage chemotherapy when she discharges from the hospital.  I encouraged her to continue  conversations regarding goals of care with him based upon her continued changes in nutrition, cognition, and functional status when she discharges from the hospital. She reports she would like for her niece, Erin [name], to be her surrogate in the event she cannot make her own decisions.  She said she thinks she has paperwork to this effect at home.  If not I recommended she follow-up with cancer center ACP clinic. No other palliative specific recommendations at this point.  Please call if we can be of further assistance in the care of Ms. Gillen this admission.  Prognosis:  Unable to determine if she continues disease modifying therapy.  If decision were made to forego further disease modifying therapy, I anticipate her prognosis  to be less than 6 months and she should qualify for hospice services at any point in the future if so desired.  Discharge Planning:  To Be Determined   Discussed with: Medical team, nursing team, patient  Todd Most Recent Value  Intake Tab   Referral Department Hospitalist  Unit at Time of Referral Oncology Unit  Palliative Care Primary Diagnosis Cancer  Date Notified 02/21/21  Palliative Care Type New Palliative care  Reason for referral Clarify Goals of Care  Date of Admission 02/20/21  Date first seen by Palliative Care 02/22/21  # of days Palliative referral response time 1 Day(s)  # of days IP prior to Palliative referral 1  Clinical Assessment   Palliative Performance Scale Score 50%  Psychosocial & Spiritual Assessment   Palliative Care Outcomes   Patient/Family meeting held? Yes  Who was at the meeting? Patient       Thank you for allowing [place] to participate in the care of Erin [name] PMT will continue to support holistically.  Time Total: 80 minutes  Micheline Rough, MD [place] Team [phone]  Team Phone # [phone] (Nights/Weekends)  02/23/2021, 9:17 AM

## 2021-02-23 NOTE — Progress Notes (Signed)
[facility] PROGRESS NOTE    Progress Note  Erin [name]  SHF:[id number] DOB: [birth year] DOA: 02/20/2021 PCP: Merryl Hacker, No     Brief Narrative:   Erin [name] is an 56 y.o. 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  CT scan of the abdomen and pelvis showed increasing pulmonary mass with new bone metastases and increased in size of left adrenal metastases as well as along nodules    Assessment/Plan:   Severe anemia symptomatic anemia due to antineoplastic drug: Signs of overt bleeding. Post 3 unit of packed red blood cells her hemoglobin this morning is 7.6. Her hemoglobin has improved, this morning is 8.5, keep her heparin. 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 Oncology was notified discussed with the patient treatment for treatment versus moving towards comfort care. The patient decided to try FOLFRI treatment.  Started once her performance status improved.  Nausea vomiting abdominal pain: Likely due to malignancy. T-max of 98.1, continue Xarelto leukocytosis 12 she has been off antibiotics throughout her hospital stay. Abdominal pain is controlled nausea is improved. Has not had a bowel movement.   Fairly controlled diabetes mellitus type 2: With an A1c of 7.1, discontinue metformin continue sliding scale insulin.  Essential hypertension: Normotensive continue amlodipine and Coreg.  Goals of care: Patient remains full  code.  Cancer related pain: Continue current home regimen.  Leukocytosis: Likely due to malignancy no clear source of infection she has remained afebrile without antibiotics.  Chronic thrombocytopenia likely due to antineoplastic drug: Mild drop this morning 69, 1 and discontinue Lovenox.   DVT prophylaxis: oncology Family Communication:none Status is: Inpatient  Remains inpatient appropriate because: Abdominal pain nausea vomiting    Code Status:     Code Status Orders  (From admission, onward)           Start     Ordered   02/20/21 2023  Full code  Continuous        02/20/21 2025           Code Status History     Date Active Date Inactive Code Status Order ID Comments User Context   12/16/2020 2018 01/14/2021 2022 Full Code [id number]  Orene Desanctis, DO ED         IV Access:   Peripheral IV   Procedures and diagnostic studies:   CT HEAD WO CONTRAST (5MM)  Result Date: 02/22/2021 CLINICAL DATA:  Encephalopathy EXAM: CT HEAD WITHOUT CONTRAST TECHNIQUE: Contiguous axial images were obtained from the base of the skull through the vertex without intravenous contrast. RADIATION DOSE REDUCTION: This exam was performed according to the departmental dose-optimization program which includes automated exposure control, adjustment of the mA and/or kV according to patient size and/or use of iterative reconstruction technique. COMPARISON:  None. FINDINGS: Brain: There is no mass, hemorrhage or extra-axial collection. The size and configuration of the ventricles and extra-axial CSF spaces are normal. The brain parenchyma is normal, without acute or chronic infarction. Vascular: No abnormal hyperdensity of the major intracranial arteries or dural venous  sinuses. No intracranial atherosclerosis. Skull: The visualized skull base, calvarium and extracranial soft tissues are normal. Sinuses/Orbits: No fluid levels or advanced mucosal thickening of the visualized paranasal sinuses.  No mastoid or middle ear effusion. The orbits are normal. IMPRESSION: Normal head CT. Electronically Signed   By: Ulyses Jarred M.D.   On: 02/22/2021 22:14     Medical Consultants:   None.   Subjective:    Erin [name] was sleeping, did not have a bowel movement once her abdominal pain is improved  Objective:    Vitals:   02/22/21 2041 02/22/21 2043 02/22/21 2303 02/23/21 0419  BP:  117/74 131/75 102/77  Pulse: 98 (!) 102 99 90  Resp:  20 18 18   Temp:  98.9 F (37.2 C) 99 F (37.2 C) 98.5 F (36.9 C)  TempSrc:  Oral Oral Oral  SpO2: 96% 96%  100%  Weight:      Height:       SpO2: 100 % O2 Flow Rate (L/min): 1 L/min   Intake/Output Summary (Last 24 hours) at 02/23/2021 0848 Last data filed at 02/23/2021 0400 Gross per 24 hour  Intake 757.5 ml  Output 650 ml  Net 107.5 ml    Filed Weights   02/20/21 1723 02/21/21 0500 02/22/21 0500  Weight: 99.8 kg 106.6 kg 106.3 kg    Exam: General exam: In no acute distress. Respiratory system: Good air movement and clear to auscultation. Cardiovascular system: S1 & S2 heard, RRR. No JVD. Gastrointestinal system: Abdomen is nondistended, soft and nontender.  Extremities: No pedal edema. Skin: No rashes, lesions or ulcers Psychiatry: Judgement and insight appear normal. Mood & affect appropriate.  Data Reviewed:    Labs: Basic Metabolic Panel: Recent Labs  Lab 02/20/21 1456 02/21/21 0756 02/22/21 0500  NA 145 139 135  K 2.7* 2.9* 3.2*  CL 108 108 104  CO2 24 23 24   GLUCOSE 195* 106* 113*  BUN 16 16 20   CREATININE 0.91 0.96 0.99  CALCIUM 8.3* 8.4* 8.7*  MG 1.5* 1.6* 1.9  PHOS  --   --  2.6    GFR Estimated Creatinine Clearance: 77.2 mL/min (by C-G formula based on SCr of 0.99 mg/dL). Liver Function Tests: Recent Labs  Lab 02/20/21 1456 02/21/21 0756 02/22/21 0500 02/22/21 0959  AST 13* 17 19 19   ALT 5 6 6 6   ALKPHOS 269* 229* 228* 227*  BILITOT 1.1 1.2 1.8* 1.5*  PROT 6.1* 5.7* 5.8* 5.8*   ALBUMIN 2.6* 2.0* 2.0* 1.9*    No results for input(s): LIPASE, AMYLASE in the last 168 hours. No results for input(s): AMMONIA in the last 168 hours. Coagulation profile No results for input(s): INR, PROTIME in the last 168 hours. COVID-19 Labs  No results for input(s): DDIMER, FERRITIN, LDH, CRP in the last 72 hours.  Lab Results  Component Value Date   SARSCOV2NAA NEGATIVE 02/20/2021   [place] NEGATIVE 12/16/2020    CBC: Recent Labs  Lab 02/20/21 1456 02/20/21 1456 02/21/21 0756 02/21/21 1003 02/21/21 1949 02/22/21 0500 02/22/21 1730 02/23/21 0339  WBC 13.3*  --  12.5*  --   --  12.5* 12.9* 12.1*  NEUTROABS 11.7*  --   --   --   --   --   --   --   HGB 6.0*   < > 6.2* 6.5* 8.1* 7.6* 8.8* 8.5*  HCT 19.5*  --  20.0* 21.1* 25.1* 24.0* 27.1* 25.8*  MCV 92.4  --  91.7  --   --  93.4 91.9 92.1  PLT 121*  --  106*  --   --  69* 55* 41*   < > = values in this interval not displayed.    Cardiac Enzymes: No results for input(s): CKTOTAL, CKMB, CKMBINDEX, TROPONINI in the last 168 hours. BNP (last 3 results) No results for input(s): PROBNP in the last 8760 hours. CBG: Recent Labs  Lab 02/22/21 0716 02/22/21 1135 02/22/21 1646 02/22/21 2012 02/23/21 0737  GLUCAP 107* 116* 124* 102* 110*    D-Dimer: No results for input(s): DDIMER in the last 72 hours. Hgb A1c: No results for input(s): HGBA1C in the last 72 hours. Lipid Profile: No results for input(s): CHOL, HDL, LDLCALC, TRIG, CHOLHDL, LDLDIRECT in the last 72 hours. Thyroid function studies: No results for input(s): TSH, T4TOTAL, T3FREE, THYROIDAB in the last 72 hours.  Invalid input(s): FREET3 Anemia work up: No results for input(s): VITAMINB12, FOLATE, FERRITIN, TIBC, IRON, RETICCTPCT in the last 72 hours. Sepsis Labs: Recent Labs  Lab 02/21/21 0756 02/22/21 0500 02/22/21 1730 02/23/21 0339  WBC 12.5* 12.5* 12.9* 12.1*    Microbiology Recent Results (from the past 240 hour(s))  Resp Panel  by RT-PCR (Flu A&B, Covid) Nasopharyngeal Swab     Status: None   Collection Time: 02/20/21  5:46 PM   Specimen: Nasopharyngeal Swab; Nasopharyngeal(NP) swabs in vial transport medium  Result Value Ref Range Status   SARS Coronavirus 2 by RT PCR NEGATIVE NEGATIVE Final    Comment: (NOTE) SARS-CoV-2 target nucleic acids are NOT DETECTED.  The SARS-CoV-2 RNA is generally detectable in upper respiratory specimens during the acute phase of infection. The lowest concentration of SARS-CoV-2 viral copies this assay can detect is 138 copies/mL. A negative result does not preclude SARS-Cov-2 infection and should not be used as the sole basis for treatment or other patient management decisions. A negative result may occur with  improper specimen collection/handling, submission of specimen other than nasopharyngeal swab, presence of viral mutation(s) within the areas targeted by this assay, and inadequate number of viral copies(<138 copies/mL). A negative result must be combined with clinical observations, patient history, and epidemiological information. The expected result is Negative.  Fact Sheet for Patients:  EntrepreneurPulse.com.au  Fact Sheet for Healthcare Providers:  IncredibleEmployment.be  This test is no t yet approved or cleared by the [place] FDA and  has been authorized for detection and/or diagnosis of SARS-CoV-2 by FDA under an Emergency Use Authorization (EUA). This EUA will remain  in effect (meaning this test can be used) for the duration of the COVID-19 declaration under Section 564(b)(1) of the Act, 21 U.S.C.section 360bbb-3(b)(1), unless the authorization is terminated  or revoked sooner.       Influenza A by PCR NEGATIVE NEGATIVE Final   Influenza B by PCR NEGATIVE NEGATIVE Final    Comment: (NOTE) The Xpert Xpress SARS-CoV-2/FLU/RSV plus assay is intended as an aid in the diagnosis of influenza from Nasopharyngeal swab  specimens and should not be used as a sole basis for treatment. Nasal washings and aspirates are unacceptable for Xpert Xpress SARS-CoV-2/FLU/RSV testing.  Fact Sheet for Patients: EntrepreneurPulse.com.au  Fact Sheet for Healthcare Providers: IncredibleEmployment.be  This test is not yet approved or cleared by the [place] FDA and has been authorized for detection and/or diagnosis of SARS-CoV-2 by FDA under an Emergency Use Authorization (EUA). This EUA will remain in effect (meaning this test can be used) for the duration of the COVID-19 declaration under Section 564(b)(1) of the Act, 21 U.S.C. section  360bbb-3(b)(1), unless the authorization is terminated or revoked.  Performed at [facility], [place] [address], [place], [place] [address]      Medications:    (feeding supplement) PROSource Plus  30 mL Oral BID BM   amLODipine  5 mg Oral Daily   carvedilol  6.25 mg Oral BID WC   Chlorhexidine Gluconate Cloth  6 each Topical Daily   feeding supplement  1 Container Oral TID BM   insulin aspart  0-15 Units Subcutaneous TID WC   insulin aspart  0-5 Units Subcutaneous QHS   multivitamin with minerals  1 tablet Oral Daily   pantoprazole  40 mg Oral Daily   polyethylene glycol  17 g Oral BID   Continuous Infusions:    LOS: 3 days   Charlynne Cousins  [facility]  02/23/2021, 8:48 AM

## 2021-02-24 DIAGNOSIS — G893 Neoplasm related pain (acute) (chronic): Secondary | ICD-10-CM | POA: Diagnosis not present

## 2021-02-24 DIAGNOSIS — I10 Essential (primary) hypertension: Secondary | ICD-10-CM | POA: Diagnosis not present

## 2021-02-24 DIAGNOSIS — C799 Secondary malignant neoplasm of unspecified site: Secondary | ICD-10-CM | POA: Diagnosis not present

## 2021-02-24 DIAGNOSIS — D649 Anemia, unspecified: Secondary | ICD-10-CM | POA: Diagnosis not present

## 2021-02-24 LAB — BPAM RBC
Blood Product Expiration Date: 202302032359
Blood Product Expiration Date: 202302132359
Blood Product Expiration Date: 202302132359
ISSUE DATE / TIME: 202301122043
ISSUE DATE / TIME: 202301131250
ISSUE DATE / TIME: 202301141231
Unit Type and Rh: 5100
Unit Type and Rh: 5100
Unit Type and Rh: 5100

## 2021-02-24 LAB — TYPE AND SCREEN
ABO/RH(D): O POS
Antibody Screen: NEGATIVE
Unit division: 0
Unit division: 0
Unit division: 0

## 2021-02-24 LAB — CBC
HCT: 26 % — ABNORMAL LOW (ref 36.0–46.0)
Hemoglobin: 8.3 g/dL — ABNORMAL LOW (ref 12.0–15.0)
MCH: 29.7 pg (ref 26.0–34.0)
MCHC: 31.9 g/dL (ref 30.0–36.0)
MCV: 93.2 fL (ref 80.0–100.0)
Platelets: 29 10*3/uL — CL (ref 150–400)
RBC: 2.79 MIL/uL — ABNORMAL LOW (ref 3.87–5.11)
RDW: 17.7 % — ABNORMAL HIGH (ref 11.5–15.5)
WBC: 11.7 10*3/uL — ABNORMAL HIGH (ref 4.0–10.5)
nRBC: 0.3 % — ABNORMAL HIGH (ref 0.0–0.2)

## 2021-02-24 LAB — GLUCOSE, CAPILLARY
Glucose-Capillary: 92 mg/dL (ref 70–99)
Glucose-Capillary: 142 mg/dL — ABNORMAL HIGH (ref 70–99)
Glucose-Capillary: 131 mg/dL — ABNORMAL HIGH (ref 70–99)

## 2021-02-24 LAB — DIC (DISSEMINATED INTRAVASCULAR COAGULATION)PANEL
D-Dimer, Quant: >20 ug/mL-FEU — ABNORMAL HIGH (ref 0.00–0.50)
Fibrinogen: 294 mg/dL (ref 210–475)
INR: 1.6 — ABNORMAL HIGH (ref 0.8–1.2)
Platelets: 30 10*3/uL — ABNORMAL LOW (ref 150–400)
Prothrombin Time: 19.2 seconds — ABNORMAL HIGH (ref 11.4–15.2)
Smear Review: NONE SEEN
aPTT: 41 seconds — ABNORMAL HIGH (ref 24–36)

## 2021-02-24 LAB — HIV-1 RNA QUANT-NO REFLEX-BLD
HIV 1 RNA Quant: <20 copies/mL
LOG10 HIV-1 RNA: UNDETERMINED log10copy/mL

## 2021-02-24 NOTE — Progress Notes (Signed)
Patient refuses to have blood sugar taken.

## 2021-02-24 NOTE — Progress Notes (Signed)
Nutrition Follow-up  DOCUMENTATION CODES:   Severe malnutrition in context of chronic illness, Morbid obesity  INTERVENTION:  - continue Boost Breeze TID and Prosource Plus BID. - continue to provide encouragement with eating and drinking as able.    NUTRITION DIAGNOSIS:   Severe Malnutrition related to chronic illness, cancer and cancer related treatments as evidenced by mild fat depletion, mild muscle depletion, percent weight loss, energy intake < 75% for > or equal to 1 month. -ongoing  GOAL:   Patient will meet greater than or equal to 90% of their needs -unmet  MONITOR:   PO intake, Supplement acceptance, Labs, Weight trends, I & O's  ASSESSMENT:   56 y.o. female with medical history of poorly differentiated metastatic adenocarcinoma with suspicion of gastric primary, undergoing chemo (folfox, last: 01/27/21), OSA, OHS, gastric cancer, HTN, and DM. she presented to the ED due to N/V/D, abdominal cramping, and weakness.  72 hour calorie count initiated on 1/13 afternoon. Calorie Count envelope not on patient's door and not in physical chart.   Flow sheet documentation indicates she ate 0% of dinner on 1/14, 0% of dinner on 1/15, and 10% of breakfast today. No other meals documented.   Patient sitting in the chair with no visitors present at the time of RD visit. An unopened cup of applesauce was in front of her on bedside table and she had a cup of water with a straw.  Patient's affect and interaction was notably different than on 1/13. She was minimally interactive and seemed disoriented vs depressed.   She denied abdominal pain or nausea, she denied having anything to eat or drink today, denied offer for RD to order her something. No other information able to be obtained.   Able to talk with RN via secure chat who shared that patient had recently worked with PTA. She reported that patient has not been eating and does not want anything from the menu.   Able to talk with  Med Onc NP on the phone. RD will continue to monitor for plan.  Weight today is +14 lb compared to weight on admission on 1/12. Non-pitting edema to BLE documented in the edema section of flow sheet. She is noted to be +2.8 L since admission.      Labs reviewed; CBGs: 92 and 142 mg/dl,; no CMP since 1/14.  Medications reviewed; sliding scale novolog, 1 tablet multivitamin 40 mg oral protonix/day.   Diet Order:   Diet Order             DIET SOFT Room service appropriate? Yes; Fluid consistency: Thin  Diet effective now                   EDUCATION NEEDS:   No education needs have been identified at this time  Skin:  Skin Assessment: Reviewed RN Assessment  Last BM:  1/15 (type 7x2, one large and one small)  Height:   Ht Readings from Last 1 Encounters:  02/20/21 5\' 4"  (1.626 m)    Weight:   Wt Readings from Last 1 Encounters:  02/24/21 110.4 kg     Estimated Nutritional Needs:  Kcal:  4967-5916 kcal Protein:  115-130 grams Fluid:  >/= 2.2 L/day      Jarome Matin, MS, RD, LDN Inpatient Clinical Dietitian RD pager # available in [place]  After hours/weekend pager # available in [facility]

## 2021-02-24 NOTE — Evaluation (Signed)
Physical Therapy Evaluation Patient Details Name: Erin [name] MRN: [id number] DOB: [birth year] Today's Date: 02/24/2021  History of Present Illness  56 y.o. 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  CT scan of the abdomen and pelvis showed increasing pulmonary mass with new bone metastases and increased in size of left adrenal metastases as well as along nodules.  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 Pt currently with functional limitations due to the deficits listed below (see PT Problem List). Pt will benefit from skilled PT to increase their independence and safety with mobility to allow discharge to the venue listed below.   Pt requiring increased time to perform all mobility possibly due to presenting with decreased cognition however also with expressive difficulties (RN notified).  Pt able to mobilize however will likely need assist upon d/c if cognition remains impaired.  Pt feels aware of word finding difficulties and states this has been going on a few days (? Accuracy) however appears unaware of other cognitive deficits.      Recommendations for follow up therapy are one component of a multi-disciplinary discharge planning process, led by the attending physician.  Recommendations may be updated based on patient status, additional functional criteria and insurance  authorization.  Follow Up Recommendations Home health PT    Assistance Recommended at Discharge Frequent or constant Supervision/Assistance  Patient can return home with the following  A little help with walking and/or transfers;A little help with bathing/dressing/bathroom;Direct supervision/assist for financial management;Assistance with cooking/housework    Equipment Recommendations None recommended by PT  Recommendations for Other Services       Functional Status Assessment Patient has had a recent decline in their functional status and demonstrates the ability to make significant improvements in function in a reasonable and predictable amount of time.     Precautions / Restrictions Precautions Precautions: Fall      Mobility  Bed Mobility               General bed mobility comments: pt sitting EOB on arrival    Transfers Overall transfer level: Needs assistance Equipment used: Rolling walker (2 wheels) Transfers: Sit to/from Stand Sit to Stand: Min assist           General transfer comment: increased time to perform (? cognition, processing delay), only light assist to rise, pt kept wide BOS    Ambulation/Gait Ambulation/Gait assistance: Min guard;+2 safety/equipment Gait Distance (Feet): 14 Feet Assistive device: Rolling walker (2 wheels) Gait Pattern/deviations: Step-through pattern;Decreased stride length Gait velocity: decr     General Gait Details: required increased time, appears slow to process all cues provided, unable to state reason for incr time (?pain, anxiety, etc)  Stairs            Wheelchair Mobility    Modified Rankin (Stroke Patients Only)       Balance  Pertinent Vitals/Pain Pain Assessment: Faces Faces Pain Scale: Hurts a little bit Pain Location: pt did not state Pain Intervention(s): Repositioned;Monitored during session    [place] expects to be discharged to:: Private residence Living Arrangements: Other (Comment) (sister) Available Help at Discharge: Friend(s);Available PRN/intermittently Type of Home: Apartment Home Access: Stairs to enter Entrance Stairs-Rails: None Entrance Stairs-Number of Steps: 4 no rails   Home Layout: One level Home Equipment: Conservation officer, nature (2 wheels)      Prior Function Prior Level of Function : Independent/Modified Independent             Mobility Comments: has been using RW since previous admission       Hand Dominance        Extremity/Trunk Assessment        Lower Extremity Assessment Lower Extremity Assessment: Generalized weakness    Cervical / Trunk Assessment Cervical / Trunk Assessment: Normal  Communication   Communication: Expressive difficulties  Cognition Arousal/Alertness: Awake/alert Behavior During Therapy: Flat affect Overall Cognitive Status: No family/caregiver present to determine baseline cognitive functioning Area of Impairment: Orientation                 Orientation Level: Disoriented to;Time;Situation             General Comments: pt unable to state date (incorrect day and year), difficulty with situation however could be expressive difficulties        General Comments      Exercises     Assessment/Plan    PT Assessment Patient needs continued PT services  PT Problem List Decreased strength;Decreased cognition;Decreased mobility;Decreased knowledge of precautions;Decreased activity tolerance;Decreased knowledge of use of DME       PT Treatment Interventions Gait training;DME instruction;Therapeutic exercise;Balance training;Functional mobility training;Therapeutic activities;Patient/family education    PT Goals (Current goals can be found in the Care Plan section)  Acute Rehab PT Goals PT Goal Formulation: With patient Time For Goal Achievement: 03/10/21 Potential to Achieve Goals: Good     Frequency Min 3X/week     Co-evaluation               AM-PAC PT "6 Clicks" Mobility  Outcome Measure Help needed turning from your back to your side while in a flat bed without using bedrails?: A Little Help needed moving from lying on your back to sitting on the side of a flat bed without using bedrails?: A Little Help needed moving to and from a bed to a chair (including a wheelchair)?: A Little Help needed standing up from a chair using your arms (e.g., wheelchair or bedside chair)?: A Little Help needed to walk in hospital room?: A Lot Help needed climbing 3-5 steps with a railing? : A Lot 6 Click Score: 16    End of Session Equipment Utilized During Treatment: Gait belt Activity Tolerance: Patient tolerated treatment well Patient left: in chair;with call bell/phone within reach;with chair alarm set Nurse Communication: Mobility status PT Visit Diagnosis: Difficulty in walking, not elsewhere classified (R26.2)    Time: 1323-1350 PT Time Calculation (min) (ACUTE ONLY): 27 min   Charges:   PT Evaluation $PT Eval Low Complexity: 1 Low PT Treatments $Gait Training: 8-22 mins      Jannette Spanner PT, DPT Acute Rehabilitation Services Pager: [phone] Office: [place] 02/24/2021, 3:33 PM

## 2021-02-24 NOTE — Progress Notes (Addendum)
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 -Bladder neck, urethra, cervical biopsies 12/17/2020 -MSS, tumor mutation burden 4, K-ras amplification, PD-L1 tumor proportion score-0 -[facility] -90% intestinal malignancy (colorectal adenocarcinoma 85%) small intestine adenocarcinoma less than 5%, gastroesophageal adenocarcinoma not excluded -12/05/2020 MRI of the pelvis-poorly marginated enhancing 3.7 x 2.8 x 3.0 cm mass centered at the urethra, solid avidly enhancing bilateral ovarian masses, poorly marginated enhancing 2.4 x 2.3 x 3.0 cm mass in the left uterine cervix, mild to moderate bilateral common iliac, bilateral external iliac, and bilateral inguinal lymphadenopathy, diffuse patchy confluent nodular replacement of the pelvic osseous structures. -12/13/2020 CEA 1864, CA125 29.2 -12/16/2020 CT abdomen/pelvis-rapidly increasing size of the left ovary with some adjacent ascites, enlarging masses elsewhere, masslike area of the cervix and potentially within a urethral diverticulum, well-circumscribed left adrenal lesion measuring 3.2 x 2.6 cm, heterogeneous pattern of subtle sclerosis and lucency in the spine. -12/16/2020 MRI of the lumbar spine-diffusely abnormal appearance of the bone marrow throughout the visualized lumbar spine and pelvis highly suspicious for diffuse osseous metastatic disease, retroperitoneal/iliac adenopathy with left larger than right adnexal masses. -12/17/2020 CT chest-no acute intrathoracic pathology -Upper endoscopy 12/22/2020-gastritis, gastric nodule biopsy-adenocarcinoma, poorly differentiated with signet ring morphology -Colonoscopy 12/22/2020-polyps removed from the ascending and sigmoid colon, extrinsic compression of the sigmoid colon-tubulovillous adenoma without high-grade dysplasia, tubular adenoma and hyperplastic polyps -12/27/2020 cycle #1 FOLFOX -01/10/2021  cycle #2 FOLFOX -01/27/2021 cycle 3 FOLFOX, oxaliplatin dose reduced -02/20/2021 CT abdomen/pelvis without contrast-increased size of the bilateral ovarian masses, interval diffuse bony metastatic disease, progressive metastatic bilateral pelvic and inguinal lymphadenopathy, increased size of the left adrenal metastasis, interval nodular densities at the lung bases suspicious for metastases, interval mild right hydronephrosis and hydroureter -02/22/2021 CEA 440 2.  Abdominal pain secondary #1 3.  Constipation 4.  Normocytic anemia 5.  Leukocytosis 6.  Protein calorie malnutrition 7.  Obstructive sleep apnea 8.  Diabetes mellitus 9.  Hypertension 10.  Possible pulmonary hypertension noted on CT chest 11.  Hospital admission 02/20/2021-intractable nausea and vomiting 12.  Right hydronephrosis and hydroureter 13.  Thrombocytopenia  Erin [name] appears stable.  She is not reporting any abdominal pain, nausea, vomiting this morning.  She was noted to have hydroureter and hydronephrosis on CT scan on admission.  UA showed leukocytes but this could be related to her bladder tumor.  Recommend checking urine culture and sensitivity to rule out UTI.  Her CBC from this morning has been reviewed and her hemoglobin remains stable but platelet count continues to drop.  Etiology of thrombocytopenia is unclear. This is unlikely to be related to her chemotherapy given that last cycle was given on 01/27/2021.  Suspect thrombocytopenia due to involvement of her bones by malignancy.  Could have a low-grade DIC.  We will check a DIC panel today.  No platelet transfusion indicated unless platelet count drops to less than 20,000 or she has active bleeding.  Her imaging studies have been reviewed and discussed with her.  CT findings are concerning for disease progression.  CEA obtained 02/22/2021 was down to 440.  We will continue to reevaluate her for consideration of salvage therapy.  However, if her condition continues  to decline, we will consider hospice referral.  HIV antibody was noted to be positive on admission.  Additional studies are pending for further evaluation including HIV 1/2 AB-differentiation and HIV RNA quantitative.  Unclear at this time if HIV antibody is a false positive.  Recommendations: 1.  Obtain urine culture and sensitivity. 2.  Check DIC panel. 3.  Monitor CBC and consider PRBC transfusion for hemoglobin less than 7 or platelet transfusion for platelet count less than 20,000 or active bleeding. 4.  Increase ambulation 5.  Monitor renal function and will consider urology consult for hydroureter/hydronephrosis. 6.  Follow-up on additional HIV studies  [name], [name], [name], [name]  Erin [name] was interviewed and examined.  She appears unchanged.  She has developed progressive thrombocytopenia over the past several days.  The thrombocytopenia is most likely related to bone marrow involvement with metastatic carcinoma versus cancer related DIC.  It is possible the thrombocytopenia is dropping is a delayed nadir from chemotherapy.  We will check a DIC panel.  HIV related  Her performance status remains poor.  She would not be a candidate for further chemotherapy unless this improves.  I was present for greater than 50% of today's visit.  I performed medical decision making. SUBJECTIVE: Mildly confused this morning.  Pain controlled.  No fevers.  No bleeding.  Oncology History  Gastric cancer ([place])  12/27/2020 Initial Diagnosis   Gastric cancer ([place])   12/27/2020 -  Chemotherapy   Patient is on Treatment Plan : GASTRIC FOLFOX q14d x 12 cycles      PHYSICAL EXAMINATION:  Vitals:   02/23/21 2047 02/24/21 0447  BP: 111/85 118/80  Pulse: (!) 102 99  Resp: 16 16  Temp: 99.1 F (37.3 C) 98 F (36.7 C)  SpO2: 99% 96%   Filed Weights   02/21/21 0500 02/22/21 0500 02/24/21 0500  Weight: 106.6 kg 106.3 kg 110.4 kg    Intake/Output from previous day: 01/15 0701 -  01/16 0700 In: 360 [P.O.:360] Out: 650 [Urine:650]  Physical Exam Constitutional:      General: She is not in acute distress. HENT:     Mouth/Throat:     Mouth: Mucous membranes are moist.  Eyes:     General: No scleral icterus. Pulmonary:     Effort: Pulmonary effort is normal. No respiratory distress.     Breath sounds: Normal breath sounds.  Abdominal:     Palpations: Abdomen is soft.     Tenderness: There is no abdominal tenderness.  Skin:    General: Skin is warm and dry.  Neurological:     Mental Status: She is alert and oriented to person, place, and time.  Psychiatric:        Mood and Affect: Mood normal.    LABORATORY DATA:  I have reviewed the data as listed CMP Latest Ref Rng & Units 02/22/2021 02/22/2021 02/21/2021  Glucose 70 - 99 mg/dL - 113(H) 106(H)  BUN 6 - 20 mg/dL - 20 16  Creatinine 0.44 - 1.00 mg/dL - 0.99 0.96  Sodium 135 - 145 mmol/L - 135 139  Potassium 3.5 - 5.1 mmol/L - 3.2(L) 2.9(L)  Chloride 98 - 111 mmol/L - 104 108  CO2 22 - 32 mmol/L - 24 23  Calcium 8.9 - 10.3 mg/dL - 8.7(L) 8.4(L)  Total Protein 6.5 - 8.1 g/dL 5.8(L) 5.8(L) 5.7(L)  Total Bilirubin 0.3 - 1.2 mg/dL 1.5(H) 1.8(H) 1.2  Alkaline Phos 38 - 126 U/L 227(H) 228(H) 229(H)  AST 15 - 41 U/L '19 19 17  ' ALT 0 - 44 U/L '6 6 6    ' Lab Results  Component Value Date   WBC 11.7 (H) 02/24/2021   HGB 8.3 (L) 02/24/2021   HCT 26.0 (L) 02/24/2021   MCV 93.2 02/24/2021   PLT 29 (LL)  02/24/2021   NEUTROABS 11.7 (H) 02/20/2021    Lab Results  Component Value Date   CEA1 440.0 (H) 02/22/2021   CEA 617.97 (H) 02/11/2021    CT Abdomen Pelvis Wo Contrast  Result Date: 02/20/2021 CLINICAL DATA:  Acute, non localized abdominal pain. Nausea, vomiting, diarrhea and fatigue for the past week. Receiving chemotherapy for gastric and ovarian cancer. EXAM: CT ABDOMEN AND PELVIS WITHOUT CONTRAST TECHNIQUE: Multidetector CT imaging of the abdomen and pelvis was performed following the standard protocol  without IV contrast. RADIATION DOSE REDUCTION: This exam was performed according to the departmental dose-optimization program which includes automated exposure control, adjustment of the mA and/or kV according to patient size and/or use of iterative reconstruction technique. COMPARISON:  Chest CT dated 12/17/2020. Abdomen and pelvis CT dated 12/16/2020. FINDINGS: Lower chest: Mildly enlarged heart. Small amount of residual atelectasis/scarring in the right lower lobe, including a somewhat oval wedge-shaped area at the posterior right lung base which is significantly smaller. Interval visualization of a 5 mm nodular density at the right lateral costophrenic angle on image number 20/6. There is a similar area more anteriorly on image number 22/6. There is also an interval 11 x 8 mm sub solid nodular density in the right middle lobe on image number 9/6. There is also an interval nodular density in the left lower lobe with eccentric cavitation measuring 11 x 7 mm on image number 8/6. Hepatobiliary: No focal liver abnormality is seen. No gallstones, gallbladder wall thickening, or biliary dilatation. Pancreas: Unremarkable. No pancreatic ductal dilatation or surrounding inflammatory changes. Spleen: Normal in size without focal abnormality. Adrenals/Urinary Tract: An exophytic posterior left adrenal mass is again demonstrated. This measures 3.2 x 2.8 cm on image number 19/2, previously 3.2 x 2.6 cm. Stable normal appearing right adrenal gland. Interval mild dilatation of the right renal collecting system and ureter to the level of the ureterovesical junction with no obstructing stone or mass visualized. Stable normal appearing left kidney and ureter. The urinary bladder is poorly distended with mild to moderate diffuse wall thickening. Stomach/Bowel: A small hiatal hernia is unchanged. Unremarkable small bowel and colon. Normal appearing appendix. Vascular/Lymphatic: Bilateral enlarged pelvic and inguinal lymph nodes  with progression. The largest is a left pelvic sidewall node with a short axis diameter of 17 mm on image number 75/2, previously 14 mm. Reproductive: A large left adnexal mass is again demonstrated with an interval increase in size. This measures 13.0 x 8.2 cm on image number 64/2, previously 10.8 x 8.8 cm. A right adnexal mass is again demonstrated, currently measuring 3.4 x 2.7 cm on image number 67/2, previously 2.9 cm in maximum diameter. Normal appearing uterus. Other: Small umbilical and infraumbilical hernias containing fat. Small left inguinal hernia containing fat. Musculoskeletal: Interval diffuse heterogeneous bone sclerosis. IMPRESSION: 1. Increased size of bilateral ovarian masses, especially on the left. 2. Interval diffuse bony metastatic disease. 3. Progressive metastatic bilateral pelvic and inguinal lymphadenopathy. 4. Increased size of a left adrenal metastasis. 5. Interval nodular densities at the lung bases suspicious for metastases. 6. Interval mild right hydronephrosis and hydroureter to the level of the ureterovesical junction. This could be due to recent stone passage, obstruction by wall thickening of the bladder or a nonvisualized obstructing ureteral metastasis. Electronically Signed   By: Claudie Revering M.D.   On: 02/20/2021 18:28   CT HEAD WO CONTRAST (5MM)  Result Date: 02/22/2021 CLINICAL DATA:  Encephalopathy EXAM: CT HEAD WITHOUT CONTRAST TECHNIQUE: Contiguous axial images were obtained from the base  of the skull through the vertex without intravenous contrast. RADIATION DOSE REDUCTION: This exam was performed according to the departmental dose-optimization program which includes automated exposure control, adjustment of the mA and/or kV according to patient size and/or use of iterative reconstruction technique. COMPARISON:  None. FINDINGS: Brain: There is no mass, hemorrhage or extra-axial collection. The size and configuration of the ventricles and extra-axial CSF spaces are  normal. The brain parenchyma is normal, without acute or chronic infarction. Vascular: No abnormal hyperdensity of the major intracranial arteries or dural venous sinuses. No intracranial atherosclerosis. Skull: The visualized skull base, calvarium and extracranial soft tissues are normal. Sinuses/Orbits: No fluid levels or advanced mucosal thickening of the visualized paranasal sinuses. No mastoid or middle ear effusion. The orbits are normal. IMPRESSION: Normal head CT. Electronically Signed   By: Ulyses Jarred M.D.   On: 02/22/2021 22:14   DG CHEST PORT 1 VIEW  Result Date: 02/20/2021 CLINICAL DATA:  History of gastric and ovarian cancer. Fatigue, nausea, vomiting, diarrhea EXAM: PORTABLE CHEST 1 VIEW COMPARISON:  12/31/2020 FINDINGS: Right Port-A-Cath in place with the tip at the cavoatrial junction. Heart is borderline in size. Mild vascular congestion. No confluent airspace opacities, effusions or overt edema. No acute bony abnormality. IMPRESSION: Borderline heart size.  Mild vascular congestion. Electronically Signed   By: Rolm Baptise M.D.   On: 02/20/2021 21:30     Future Appointments  Date Time Provider [place] [facility]  02/25/2021  9:15 AM [facility] PHLEBOTOMIST [facility] None  02/25/2021  9:30 AM [facility] FLUSH ROOM [facility] None  02/25/2021 10:00 AM Ladell Pier, MD [facility] None  02/25/2021 11:00 AM [facility] CHAIR 1 [facility] None  02/27/2021  1:00 PM [facility] FLUSH ROOM [facility] None      LOS: 4 days

## 2021-02-24 NOTE — Progress Notes (Signed)
[facility] PROGRESS NOTE    Progress Note  Erin [name]  IOX:[id number] DOB: 1965/10/02 DOA: 02/20/2021 PCP: Merryl Hacker, No     Brief Narrative:   Erin [name] is an 56 y.o. 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  CT scan of the abdomen and pelvis showed increasing pulmonary mass with new bone metastases and increased in size of left adrenal metastases as well as along nodules    Assessment/Plan:   Severe anemia symptomatic anemia due to antineoplastic drug: No signs of overt bleeding. Post 3 unit of packed red blood cells her hemoglobin this morning is 7.6. Her hemoglobin has improved, this morning is 8.3. Keep off heparin.  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 Oncology was notified discussed with the patient treatment for treatment versus moving towards comfort care. The patient decided to try FOLFRI treatment.  Started once her performance status improved. Awaiting oncology further recommendations.  Nausea vomiting abdominal pain: Likely due to malignancy. T-max of 98.1, leukocytosis 12 she has been off antibiotics throughout her hospital stay. Abdominal pain is controlled nausea is improved. Has not had a bowel movement.  Fairly controlled diabetes mellitus type 2: With an A1c of 7.1, discontinue metformin continue sliding scale insulin.  Essential hypertension: Normotensive continue amlodipine and Coreg.  Goals of care: Patient remains full  code.  Cancer related pain: Continue current home regimen.  Leukocytosis: Likely due to malignancy no clear source of infection she has remained afebrile without antibiotics.  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  Keep the patient off heparin. 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 DIC panel ordered by hematology oncology. Awaiting oncology further recommendations.  DVT prophylaxis: oncology Family Communication:none Status is: Inpatient  Remains inpatient appropriate because: Abdominal pain nausea vomiting    Code Status:     Code Status Orders  (From admission, onward)           Start     Ordered   02/20/21 2023  Full code  Continuous        02/20/21 2025           Code Status History     Date Active Date Inactive Code Status Order ID Comments User Context   12/16/2020 2018 01/14/2021 2022 Full Code [id number]  Orene Desanctis, DO ED         IV Access:   Peripheral IV   Procedures and diagnostic studies:   CT HEAD WO CONTRAST (5MM)  Result Date: 02/22/2021 CLINICAL DATA:  Encephalopathy EXAM: CT HEAD WITHOUT CONTRAST TECHNIQUE: Contiguous axial images were obtained from the base of the skull through the vertex without intravenous contrast. RADIATION DOSE REDUCTION: This exam was performed according to the departmental dose-optimization program which includes automated exposure control, adjustment of the mA and/or kV according to patient size and/or use of iterative reconstruction technique. COMPARISON:  None. FINDINGS: Brain: There is no mass, hemorrhage or extra-axial collection. The size and configuration of the ventricles and extra-axial CSF spaces are normal. The brain parenchyma is normal, without acute or  chronic infarction. Vascular: No abnormal hyperdensity of the major intracranial arteries or dural venous sinuses. No intracranial atherosclerosis. Skull: The visualized skull base, calvarium and extracranial soft  tissues are normal. Sinuses/Orbits: No fluid levels or advanced mucosal thickening of the visualized paranasal sinuses. No mastoid or middle ear effusion. The orbits are normal. IMPRESSION: Normal head CT. Electronically Signed   By: Ulyses Jarred M.D.   On: 02/22/2021 22:14     Medical Consultants:   None.   Subjective:    Erin [name] no complaints today.  Objective:    Vitals:   02/23/21 1350 02/23/21 2047 02/24/21 0447 02/24/21 0500  BP: 114/72 111/85 118/80   Pulse: 89 (!) 102 99   Resp: 18 16 16    Temp: 97.8 F (36.6 C) 99.1 F (37.3 C) 98 F (36.7 C)   TempSrc: Oral Oral Oral   SpO2: 98% 99% 96%   Weight:    110.4 kg  Height:       SpO2: 96 % O2 Flow Rate (L/min): 1 L/min   Intake/Output Summary (Last 24 hours) at 02/24/2021 0956 Last data filed at 02/24/2021 0518 Gross per 24 hour  Intake 360 ml  Output 650 ml  Net -290 ml    Filed Weights   02/21/21 0500 02/22/21 0500 02/24/21 0500  Weight: 106.6 kg 106.3 kg 110.4 kg    Exam: General exam: In no acute distress. Respiratory system: Good air movement and clear to auscultation. Cardiovascular system: S1 & S2 heard, RRR. No JVD. Gastrointestinal system: Abdomen is nondistended, soft and nontender.  Extremities: No pedal edema. Skin: No rashes, lesions or ulcers Psychiatry: Judgement and insight appear normal. Mood & affect appropriate..  Data Reviewed:    Labs: Basic Metabolic Panel: Recent Labs  Lab 02/20/21 1456 02/21/21 0756 02/22/21 0500  NA 145 139 135  K 2.7* 2.9* 3.2*  CL 108 108 104  CO2 24 23 24   GLUCOSE 195* 106* 113*  BUN 16 16 20   CREATININE 0.91 0.96 0.99  CALCIUM 8.3* 8.4* 8.7*  MG 1.5* 1.6* 1.9  PHOS  --   --  2.6    GFR Estimated Creatinine Clearance: 79 mL/min (by C-G formula based on SCr of 0.99 mg/dL). Liver Function Tests: Recent Labs  Lab 02/20/21 1456 02/21/21 0756 02/22/21 0500 02/22/21 0959  AST 13* 17 19 19   ALT 5 6 6 6   ALKPHOS 269* 229* 228*  227*  BILITOT 1.1 1.2 1.8* 1.5*  PROT 6.1* 5.7* 5.8* 5.8*  ALBUMIN 2.6* 2.0* 2.0* 1.9*    No results for input(s): LIPASE, AMYLASE in the last 168 hours. No results for input(s): AMMONIA in the last 168 hours. Coagulation profile No results for input(s): INR, PROTIME in the last 168 hours. COVID-19 Labs  No results for input(s): DDIMER, FERRITIN, LDH, CRP in the last 72 hours.  Lab Results  Component Value Date   SARSCOV2NAA NEGATIVE 02/20/2021   [place] NEGATIVE 12/16/2020    CBC: Recent Labs  Lab 02/20/21 1456 02/21/21 0756 02/21/21 1003 02/21/21 1949 02/22/21 0500 02/22/21 1730 02/23/21 0339 02/24/21 0403  WBC 13.3* 12.5*  --   --  12.5* 12.9* 12.1* 11.7*  NEUTROABS 11.7*  --   --   --   --   --   --   --   HGB 6.0* 6.2*   < > 8.1* 7.6* 8.8* 8.5* 8.3*  HCT 19.5* 20.0*   < > 25.1* 24.0* 27.1* 25.8* 26.0*  MCV 92.4 91.7  --   --  93.4 91.9 92.1 93.2  PLT 121* 106*  --   --  69* 55* 41* 29*   < > = values in this interval not displayed.    Cardiac Enzymes: No results for input(s): CKTOTAL, CKMB, CKMBINDEX, TROPONINI in the last 168 hours. BNP (last 3 results) No results for input(s): PROBNP in the last 8760 hours. CBG: Recent Labs  Lab 02/23/21 0737 02/23/21 1251 02/23/21 1617 02/23/21 2114 02/24/21 0756  GLUCAP 110* 154* 91 92 92    D-Dimer: No results for input(s): DDIMER in the last 72 hours. Hgb A1c: No results for input(s): HGBA1C in the last 72 hours. Lipid Profile: No results for input(s): CHOL, HDL, LDLCALC, TRIG, CHOLHDL, LDLDIRECT in the last 72 hours. Thyroid function studies: No results for input(s): TSH, T4TOTAL, T3FREE, THYROIDAB in the last 72 hours.  Invalid input(s): FREET3 Anemia work up: No results for input(s): VITAMINB12, FOLATE, FERRITIN, TIBC, IRON, RETICCTPCT in the last 72 hours. Sepsis Labs: Recent Labs  Lab 02/22/21 0500 02/22/21 1730 02/23/21 0339 02/24/21 0403  WBC 12.5* 12.9* 12.1* 11.7*     Microbiology Recent Results (from the past 240 hour(s))  Resp Panel by RT-PCR (Flu A&B, Covid) Nasopharyngeal Swab     Status: None   Collection Time: 02/20/21  5:46 PM   Specimen: Nasopharyngeal Swab; Nasopharyngeal(NP) swabs in vial transport medium  Result Value Ref Range Status   SARS Coronavirus 2 by RT PCR NEGATIVE NEGATIVE Final    Comment: (NOTE) SARS-CoV-2 target nucleic acids are NOT DETECTED.  The SARS-CoV-2 RNA is generally detectable in upper respiratory specimens during the acute phase of infection. The lowest concentration of SARS-CoV-2 viral copies this assay can detect is 138 copies/mL. A negative result does not preclude SARS-Cov-2 infection and should not be used as the sole basis for treatment or other patient management decisions. A negative result may occur with  improper specimen collection/handling, submission of specimen other than nasopharyngeal swab, presence of viral mutation(s) within the areas targeted by this assay, and inadequate number of viral copies(<138 copies/mL). A negative result must be combined with clinical observations, patient history, and epidemiological information. The expected result is Negative.  Fact Sheet for Patients:  EntrepreneurPulse.com.au  Fact Sheet for Healthcare Providers:  IncredibleEmployment.be  This test is no t yet approved or cleared by the [place] FDA and  has been authorized for detection and/or diagnosis of SARS-CoV-2 by FDA under an Emergency Use Authorization (EUA). This EUA will remain  in effect (meaning this test can be used) for the duration of the COVID-19 declaration under Section 564(b)(1) of the Act, 21 U.S.C.section 360bbb-3(b)(1), unless the authorization is terminated  or revoked sooner.       Influenza A by PCR NEGATIVE NEGATIVE Final   Influenza B by PCR NEGATIVE NEGATIVE Final    Comment: (NOTE) The Xpert Xpress SARS-CoV-2/FLU/RSV plus assay is  intended as an aid in the diagnosis of influenza from Nasopharyngeal swab specimens and should not be used as a sole basis for treatment. Nasal washings and aspirates are unacceptable for Xpert Xpress SARS-CoV-2/FLU/RSV testing.  Fact Sheet for Patients: EntrepreneurPulse.com.au  Fact Sheet for Healthcare Providers: IncredibleEmployment.be  This test is not yet approved or cleared by the [place] FDA and has been authorized for detection and/or diagnosis of SARS-CoV-2 by FDA under an Emergency Use Authorization (EUA). This EUA will remain in effect (meaning this test can be used) for the duration of the COVID-19 declaration under Section 564(b)(1) of the Act, 21 U.S.C. section 360bbb-3(b)(1),  unless the authorization is terminated or revoked.  Performed at [facility], [place] [address] [place],  [address]      Medications:    (feeding supplement) PROSource Plus  30 mL Oral BID BM   amLODipine  5 mg Oral Daily   carvedilol  6.25 mg Oral BID WC   Chlorhexidine Gluconate Cloth  6 each Topical Daily   feeding supplement  1 Container Oral TID BM   insulin aspart  0-15 Units Subcutaneous TID WC   insulin aspart  0-5 Units Subcutaneous QHS   multivitamin with minerals  1 tablet Oral Daily   pantoprazole  40 mg Oral Daily   polyethylene glycol  17 g Oral BID   Continuous Infusions:    LOS: 4 days   Charlynne Cousins  [facility]  02/24/2021, 9:56 AM

## 2021-02-25 ENCOUNTER — Inpatient Hospital Stay: Payer: 59

## 2021-02-25 ENCOUNTER — Inpatient Hospital Stay: Payer: 59 | Admitting: Oncology

## 2021-02-25 DIAGNOSIS — C799 Secondary malignant neoplasm of unspecified site: Secondary | ICD-10-CM | POA: Diagnosis not present

## 2021-02-25 DIAGNOSIS — D649 Anemia, unspecified: Secondary | ICD-10-CM | POA: Diagnosis not present

## 2021-02-25 DIAGNOSIS — I10 Essential (primary) hypertension: Secondary | ICD-10-CM | POA: Diagnosis not present

## 2021-02-25 DIAGNOSIS — G893 Neoplasm related pain (acute) (chronic): Secondary | ICD-10-CM | POA: Diagnosis not present

## 2021-02-25 LAB — BASIC METABOLIC PANEL
Anion gap: 6 (ref 5–15)
BUN: 21 mg/dL — ABNORMAL HIGH (ref 6–20)
CO2: 23 mmol/L (ref 22–32)
Calcium: 8.2 mg/dL — ABNORMAL LOW (ref 8.9–10.3)
Chloride: 102 mmol/L (ref 98–111)
Creatinine, Ser: 0.99 mg/dL (ref 0.44–1.00)
GFR, Estimated: >60 mL/min (ref >60)
Glucose, Bld: 151 mg/dL — ABNORMAL HIGH (ref 70–99)
Potassium: 3.2 mmol/L — ABNORMAL LOW (ref 3.5–5.1)
Sodium: 131 mmol/L — ABNORMAL LOW (ref 135–145)

## 2021-02-25 LAB — HIV-1/HIV-2 QUALITATIVE RNA
Final Interpretation: NEGATIVE
HIV-1 RNA, Qualitative: NONREACTIVE
HIV-2 RNA, Qualitative: NONREACTIVE

## 2021-02-25 LAB — CBC
HCT: 23.6 % — ABNORMAL LOW (ref 36.0–46.0)
Hemoglobin: 7.5 g/dL — ABNORMAL LOW (ref 12.0–15.0)
MCH: 29.4 pg (ref 26.0–34.0)
MCHC: 31.8 g/dL (ref 30.0–36.0)
MCV: 92.5 fL (ref 80.0–100.0)
Platelets: 24 10*3/uL — CL (ref 150–400)
RBC: 2.55 MIL/uL — ABNORMAL LOW (ref 3.87–5.11)
RDW: 17.9 % — ABNORMAL HIGH (ref 11.5–15.5)
WBC: 11.4 10*3/uL — ABNORMAL HIGH (ref 4.0–10.5)
nRBC: 0.3 % — ABNORMAL HIGH (ref 0.0–0.2)

## 2021-02-25 LAB — HIV-1/2 AB - DIFFERENTIATION
HIV 1 Ab: NONREACTIVE
HIV 2 Ab: NONREACTIVE
Note: NEGATIVE

## 2021-02-25 LAB — GLUCOSE, CAPILLARY
Glucose-Capillary: 107 mg/dL — ABNORMAL HIGH (ref 70–99)
Glucose-Capillary: 136 mg/dL — ABNORMAL HIGH (ref 70–99)
Glucose-Capillary: 112 mg/dL — ABNORMAL HIGH (ref 70–99)
Glucose-Capillary: 132 mg/dL — ABNORMAL HIGH (ref 70–99)

## 2021-02-25 MED ORDER — PHYTONADIONE 5 MG PO TABS
10.0000 mg | ORAL_TABLET | Freq: Every day | ORAL | Status: AC
  Administered 2021-02-25 – 2021-02-28 (×4): 10 mg via ORAL
  Filled 2021-02-25 (×4): qty 2

## 2021-02-25 MED ORDER — POLYETHYLENE GLYCOL 3350 17 G PO PACK
17.0000 g | PACK | Freq: Two times a day (BID) | ORAL | Status: AC
  Administered 2021-02-25 – 2021-02-27 (×4): 17 g via ORAL
  Filled 2021-02-25 (×5): qty 1

## 2021-02-25 MED ORDER — SORBITOL 70 % SOLN
60.0000 mL | Freq: Every day | Status: AC
  Filled 2021-02-25: qty 60

## 2021-02-25 NOTE — TOC Progression Note (Signed)
Transition of Care [facility]) - Progression Note    Patient Details  Name: Erin [name] MRN: [id number] Date of Birth: 12-Jul-1965  Transition of Care [facility]) CM/SW Contact  Leeroy Cha, RN Phone Number: 02/25/2021, 8:48 AM  Clinical Narrative:    Following for toc needs for home care.   Expected Discharge Plan: Home/Self Care Barriers to Discharge: No Barriers Identified  Expected Discharge Plan and Services Expected Discharge Plan: Home/Self Care       Living arrangements for the past 2 months: Single Family Home                                       Social Determinants of Health (SDOH) Interventions    Readmission Risk Interventions Readmission Risk Prevention Plan 01/06/2021  Transportation Screening Complete  Medication Review Press photographer) Complete  PCP or Specialist appointment within 3-5 days of discharge Complete  HRI or Home Care Consult Complete  SW Recovery Care/Counseling Consult Complete  Palliative Care Screening Not [place] Complete  Some recent data might be hidden

## 2021-02-25 NOTE — Progress Notes (Signed)
[facility] PROGRESS NOTE    Progress Note  Erin [name]  WCB:[id number] DOB: Oct 23, 1965 DOA: 02/20/2021 PCP: Merryl Hacker, No     Brief Narrative:   Erin [name] is an 56 y.o. 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  CT scan of the abdomen and pelvis showed increasing pulmonary mass with new bone metastases and increased in size of left adrenal metastases as well as along nodules    Assessment/Plan:   Severe anemia symptomatic anemia due to antineoplastic drug: No signs of overt bleeding. Post 3 unit of packed red blood cells her hemoglobin this morning is 7.5. Her hemoglobin has improved, this morning is 8.3. Keep off heparin. Further management per urology. 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 Oncology was notified discussed with the patient treatment for treatment versus moving towards comfort care. The patient decided to try FOLFRI treatment.  Started once her performance status improved.  Nausea vomiting abdominal pain: Likely due to malignancy. T-max of 100.7, cytosis of 11 off antibiotics throughout her hospital stay.   Abdominal pain is controlled nausea is improved. Abdominal pain nausea vomiting and fever likely due to malignancy.  Fairly controlled diabetes mellitus type 2: With an A1c of 7.1, discontinue metformin continue sliding scale insulin.  Essential  hypertension: Normotensive continue amlodipine and Coreg.  Goals of care: Patient remains full code.  Cancer related pain: Continue current home regimen.  Leukocytosis: Likely due to malignancy no clear source of infection she has remained afebrile without antibiotics.  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  Keep the patient off heparin. 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 Awaiting oncology further recommendations.  DVT prophylaxis: oncology Family Communication:none Status is: Inpatient  Remains inpatient appropriate because: Abdominal pain nausea vomiting    Code Status:     Code Status Orders  (From admission, onward)           Start     Ordered   02/20/21 2023  Full code  Continuous        02/20/21 2025           Code Status History     Date Active Date Inactive Code Status Order ID Comments User Context   12/16/2020 2018 01/14/2021 2022 Full Code [id number]  Orene Desanctis, DO ED         IV Access:   Peripheral IV   Procedures and diagnostic studies:   No results found.   Medical Consultants:   None.   Subjective:    Erin [name] no complaints today.  Objective:    Vitals:   02/24/21 2058 02/24/21 2106 02/25/21 0600 02/25/21 0710  BP:  (!) 142/91  115/66  Pulse: (!) 110 (!) 104  (!) 107  Resp:  18  18  Temp: 97.6 F (36.4 C) 97.6 F (36.4 C)  97.6 F (36.4 C)  TempSrc: Oral Oral  Oral  SpO2:  98%  94%  Weight:   111.4 kg  Height:       SpO2: 94 % O2 Flow Rate (L/min): 1 L/min   Intake/Output Summary (Last 24 hours) at 02/25/2021 1006 Last data filed at 02/25/2021 0711 Gross per 24 hour  Intake 120 ml  Output 700 ml  Net -580 ml    Filed Weights   02/22/21 0500 02/24/21 0500 02/25/21 0600  Weight: 106.3 kg 110.4 kg 111.4 kg    Exam: General exam: In no acute distress. Respiratory system: Good air movement and clear to auscultation. Cardiovascular system: S1 & S2  heard, RRR. No JVD. Gastrointestinal system: Abdomen is nondistended, soft and nontender.  Extremities: No pedal edema. Skin: No rashes, lesions or ulcers Psychiatry: Judgement and insight appear normal. Mood & affect appropriate..  Data Reviewed:    Labs: Basic Metabolic Panel: Recent Labs  Lab 02/20/21 1456 02/21/21 0756 02/22/21 0500  NA 145 139 135  K 2.7* 2.9* 3.2*  CL 108 108 104  CO2 24 23 24   GLUCOSE 195* 106* 113*  BUN 16 16 20   CREATININE 0.91 0.96 0.99  CALCIUM 8.3* 8.4* 8.7*  MG 1.5* 1.6* 1.9  PHOS  --   --  2.6    GFR Estimated Creatinine Clearance: 79.4 mL/min (by C-G formula based on SCr of 0.99 mg/dL). Liver Function Tests: Recent Labs  Lab 02/20/21 1456 02/21/21 0756 02/22/21 0500 02/22/21 0959  AST 13* 17 19 19   ALT 5 6 6 6   ALKPHOS 269* 229* 228* 227*  BILITOT 1.1 1.2 1.8* 1.5*  PROT 6.1* 5.7* 5.8* 5.8*  ALBUMIN 2.6* 2.0* 2.0* 1.9*    No results for input(s): LIPASE, AMYLASE in the last 168 hours. No results for input(s): AMMONIA in the last 168 hours. Coagulation profile Recent Labs  Lab 02/24/21 1307  INR 1.6*   COVID-19 Labs  Recent Labs    02/24/21 1307  DDIMER >20.00*    Lab Results  Component Value Date   SARSCOV2NAA NEGATIVE 02/20/2021   [name] NEGATIVE 12/16/2020    CBC: Recent Labs  Lab 02/20/21 1456 02/21/21 0756 02/22/21 0500 02/22/21 1730 02/23/21 0339 02/24/21 0403 02/24/21 1307 02/25/21 0348  WBC 13.3*   < > 12.5* 12.9* 12.1* 11.7*  --  11.4*  NEUTROABS 11.7*  --   --   --   --   --   --   --   HGB 6.0*   < > 7.6* 8.8* 8.5* 8.3*  --  7.5*  HCT 19.5*   < > 24.0* 27.1* 25.8* 26.0*  --  23.6*  MCV 92.4   < > 93.4 91.9 92.1 93.2  --  92.5  PLT 121*   < > 69* 55* 41* 29* 30* 24*   < > = values in this interval not displayed.    Cardiac Enzymes: No results for input(s): CKTOTAL, CKMB, CKMBINDEX, TROPONINI in the last 168 hours. BNP (last 3 results) No results for input(s): PROBNP in the last  8760 hours. CBG: Recent Labs  Lab 02/23/21 2114 02/24/21 0756 02/24/21 1132 02/24/21 1642 02/25/21 0729  GLUCAP 92 92 142* 131* 107*    D-Dimer: Recent Labs    02/24/21 1307  DDIMER >20.00*   Hgb A1c: No results for input(s): HGBA1C in the last 72 hours. Lipid Profile: No results for input(s): CHOL, HDL, LDLCALC, TRIG, CHOLHDL, LDLDIRECT in the last 72 hours. Thyroid function studies: No results for input(s): TSH, T4TOTAL, T3FREE, THYROIDAB in the last 72 hours.  Invalid input(s): FREET3 Anemia work up: No results for input(s): VITAMINB12, FOLATE,  FERRITIN, TIBC, IRON, RETICCTPCT in the last 72 hours. Sepsis Labs: Recent Labs  Lab 02/22/21 1730 02/23/21 0339 02/24/21 0403 02/25/21 0348  WBC 12.9* 12.1* 11.7* 11.4*    Microbiology Recent Results (from the past 240 hour(s))  Resp Panel by RT-PCR (Flu A&B, Covid) Nasopharyngeal Swab     Status: None   Collection Time: 02/20/21  5:46 PM   Specimen: Nasopharyngeal Swab; Nasopharyngeal(NP) swabs in vial transport medium  Result Value Ref Range Status   SARS Coronavirus 2 by RT PCR NEGATIVE NEGATIVE Final    Comment: (NOTE) SARS-CoV-2 target nucleic acids are NOT DETECTED.  The SARS-CoV-2 RNA is generally detectable in upper respiratory specimens during the acute phase of infection. The lowest concentration of SARS-CoV-2 viral copies this assay can detect is 138 copies/mL. A negative result does not preclude SARS-Cov-2 infection and should not be used as the sole basis for treatment or other patient management decisions. A negative result may occur with  improper specimen collection/handling, submission of specimen other than nasopharyngeal swab, presence of viral mutation(s) within the areas targeted by this assay, and inadequate number of viral copies(<138 copies/mL). A negative result must be combined with clinical observations, patient history, and epidemiological information. The expected result is  Negative.  Fact Sheet for Patients:  EntrepreneurPulse.com.au  Fact Sheet for Healthcare Providers:  IncredibleEmployment.be  This test is no t yet approved or cleared by the [place] FDA and  has been authorized for detection and/or diagnosis of SARS-CoV-2 by FDA under an Emergency Use Authorization (EUA). This EUA will remain  in effect (meaning this test can be used) for the duration of the COVID-19 declaration under Section 564(b)(1) of the Act, 21 U.S.C.section 360bbb-3(b)(1), unless the authorization is terminated  or revoked sooner.       Influenza A by PCR NEGATIVE NEGATIVE Final   Influenza B by PCR NEGATIVE NEGATIVE Final    Comment: (NOTE) The Xpert Xpress SARS-CoV-2/FLU/RSV plus assay is intended as an aid in the diagnosis of influenza from Nasopharyngeal swab specimens and should not be used as a sole basis for treatment. Nasal washings and aspirates are unacceptable for Xpert Xpress SARS-CoV-2/FLU/RSV testing.  Fact Sheet for Patients: EntrepreneurPulse.com.au  Fact Sheet for Healthcare Providers: IncredibleEmployment.be  This test is not yet approved or cleared by the [place] FDA and has been authorized for detection and/or diagnosis of SARS-CoV-2 by FDA under an Emergency Use Authorization (EUA). This EUA will remain in effect (meaning this test can be used) for the duration of the COVID-19 declaration under Section 564(b)(1) of the Act, 21 U.S.C. section 360bbb-3(b)(1), unless the authorization is terminated or revoked.  Performed at [facility], [place] [address] [place], [place] [address]      Medications:    (feeding supplement) PROSource Plus  30 mL Oral BID BM   amLODipine  5 mg Oral Daily   carvedilol  6.25 mg Oral BID WC   Chlorhexidine Gluconate Cloth  6 each Topical Daily   feeding supplement  1 Container Oral TID BM   insulin aspart  0-15  Units Subcutaneous TID WC   insulin aspart  0-5 Units Subcutaneous QHS   multivitamin with minerals  1 tablet Oral Daily   pantoprazole  40 mg Oral Daily   phytonadione  10 mg Oral Daily   Continuous Infusions:    LOS: 5 days   Charlynne Cousins  [facility]  02/25/2021, 10:06 AM

## 2021-02-25 NOTE — Progress Notes (Addendum)
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 -Bladder neck, urethra, cervical biopsies 12/17/2020 -MSS, tumor mutation burden 4, K-ras amplification, PD-L1 tumor proportion score-0 -[facility] -90% intestinal malignancy (colorectal adenocarcinoma 85%) small intestine adenocarcinoma less than 5%, gastroesophageal adenocarcinoma not excluded -12/05/2020 MRI of the pelvis-poorly marginated enhancing 3.7 x 2.8 x 3.0 cm mass centered at the urethra, solid avidly enhancing bilateral ovarian masses, poorly marginated enhancing 2.4 x 2.3 x 3.0 cm mass in the left uterine cervix, mild to moderate bilateral common iliac, bilateral external iliac, and bilateral inguinal lymphadenopathy, diffuse patchy confluent nodular replacement of the pelvic osseous structures. -12/13/2020 CEA 1864, CA125 29.2 -12/16/2020 CT abdomen/pelvis-rapidly increasing size of the left ovary with some adjacent ascites, enlarging masses elsewhere, masslike area of the cervix and potentially within a urethral diverticulum, well-circumscribed left adrenal lesion measuring 3.2 x 2.6 cm, heterogeneous pattern of subtle sclerosis and lucency in the spine. -12/16/2020 MRI of the lumbar spine-diffusely abnormal appearance of the bone marrow throughout the visualized lumbar spine and pelvis highly suspicious for diffuse osseous metastatic disease, retroperitoneal/iliac adenopathy with left larger than right adnexal masses. -12/17/2020 CT chest-no acute intrathoracic pathology -Upper endoscopy 12/22/2020-gastritis, gastric nodule biopsy-adenocarcinoma, poorly differentiated with signet ring morphology -Colonoscopy 12/22/2020-polyps removed from the ascending and sigmoid colon, extrinsic compression of the sigmoid colon-tubulovillous adenoma without high-grade dysplasia, tubular adenoma and hyperplastic polyps -12/27/2020 cycle #1 FOLFOX -01/10/2021  cycle #2 FOLFOX -01/27/2021 cycle 3 FOLFOX, oxaliplatin dose reduced -02/20/2021 CT abdomen/pelvis without contrast-increased size of the bilateral ovarian masses, interval diffuse bony metastatic disease, progressive metastatic bilateral pelvic and inguinal lymphadenopathy, increased size of the left adrenal metastasis, interval nodular densities at the lung bases suspicious for metastases, interval mild right hydronephrosis and hydroureter -02/22/2021 CEA 440 2.  Abdominal pain secondary #1 3.  Constipation 4.  Normocytic anemia 5.  Leukocytosis 6.  Protein calorie malnutrition 7.  Obstructive sleep apnea 8.  Diabetes mellitus 9.  Hypertension 10.  Possible pulmonary hypertension noted on CT chest 11.  Hospital admission 02/20/2021-intractable nausea and vomiting 12.  Right hydronephrosis and hydroureter 13.  Thrombocytopenia  Ms. Vorndran appears stable.  She is not reporting any abdominal pain, nausea, vomiting this morning.  She was noted to have hydroureter and hydronephrosis on CT scan on admission.  UA showed leukocytes but this could be related to her bladder tumor.  Urine culture pending.  She has persistent thrombocytopenia.  DIC panel reviewed and most consistent with cancer related DIC.  No need for platelet transfusion unless active bleeding.  Will administer vitamin K 10 mg p.o. daily x4 days.  Her imaging studies have been reviewed and discussed with her.  CT findings are concerning for disease progression.  CEA obtained 02/22/2021 was down to 440. She is currently not a candidate for systemic treatment due to thrombocytopenia.  If her performance status and platelets improve, systemic therapy can be considered.  If not, we would recommend hospice.  HIV antibody was noted to be positive on admission.  HIV RNA quant undetectable.  HIV antibody positivity appears to be a false positive.  Recommendations: 1.  We will follow-up on urine culture 2.  Vitamin K 10 mg p.o. daily x4  days 3.  Monitor CBC.  Transfuse platelets for active bleeding. 4.  Increase ambulation  [name], [name], [name], [name]  Ms. Hobday was interviewed and examined.  She appears stable.  The platelet count remains low.  The PT and PTT are elevated.  I  suspect the thrombocytopenia is secondary to cancer related DIC, bone marrow involvement with tumor, and chemotherapy.  She will not be a candidate for further chemotherapy unless the platelet count and her performance status improve.  She will most likely need skilled nursing facility placement.  I was present for greater than 50% of today's visit.  I performed medical stage making.    SUBJECTIVE: Remains mildly confused this morning.  Denies pain.  Denies nausea and vomiting.  She is not eating very well.  Oncology History  Gastric cancer ([place])  12/27/2020 Initial Diagnosis   Gastric cancer ([place])   12/27/2020 -  Chemotherapy   Patient is on Treatment Plan : GASTRIC FOLFOX q14d x 12 cycles      PHYSICAL EXAMINATION:  Vitals:   02/24/21 2106 02/25/21 0710  BP: (!) 142/91 115/66  Pulse: (!) 104 (!) 107  Resp: 18 18  Temp: 97.6 F (36.4 C) 97.6 F (36.4 C)  SpO2: 98% 94%   Filed Weights   02/22/21 0500 02/24/21 0500 02/25/21 0600  Weight: 106.3 kg 110.4 kg 111.4 kg    Intake/Output from previous day: 01/16 0701 - 01/17 0700 In: 240 [P.O.:240] Out: 400 [Urine:400]  Physical Exam Constitutional:      General: She is not in acute distress. HENT:     Mouth/Throat:     Mouth: Mucous membranes are moist.  Eyes:     General: No scleral icterus. Pulmonary:     Effort: Pulmonary effort is normal. No respiratory distress.     Breath sounds: Normal breath sounds.  Abdominal:     Palpations: Abdomen is soft.     Tenderness: There is no abdominal tenderness.  Skin:    General: Skin is warm and dry.  Neurological:     Mental Status: She is alert and oriented to person, place, and time.  Psychiatric:        Mood and  Affect: Mood normal.    LABORATORY DATA:  I have reviewed the data as listed CMP Latest Ref Rng & Units 02/22/2021 02/22/2021 02/21/2021  Glucose 70 - 99 mg/dL - 113(H) 106(H)  BUN 6 - 20 mg/dL - 20 16  Creatinine 0.44 - 1.00 mg/dL - 0.99 0.96  Sodium 135 - 145 mmol/L - 135 139  Potassium 3.5 - 5.1 mmol/L - 3.2(L) 2.9(L)  Chloride 98 - 111 mmol/L - 104 108  CO2 22 - 32 mmol/L - 24 23  Calcium 8.9 - 10.3 mg/dL - 8.7(L) 8.4(L)  Total Protein 6.5 - 8.1 g/dL 5.8(L) 5.8(L) 5.7(L)  Total Bilirubin 0.3 - 1.2 mg/dL 1.5(H) 1.8(H) 1.2  Alkaline Phos 38 - 126 U/L 227(H) 228(H) 229(H)  AST 15 - 41 U/L _0 ALT 0 - 44 U/L _1 Lab Results  Component Value Date   WBC 11.4 (H) 02/25/2021   HGB 7.5 (L) 02/25/2021   HCT 23.6 (L) 02/25/2021   MCV 92.5 02/25/2021   PLT 24 (LL) 02/25/2021   NEUTROABS 11.7 (H) 02/20/2021    Lab Results  Component Value Date   CEA1 440.0 (H) 02/22/2021   CEA 617.97 (H) 02/11/2021    CT Abdomen Pelvis Wo Contrast  Result Date: 02/20/2021 CLINICAL DATA:  Acute, non localized abdominal pain. Nausea, vomiting, diarrhea and fatigue for the past week. Receiving chemotherapy for gastric and ovarian cancer. EXAM: CT ABDOMEN AND PELVIS WITHOUT CONTRAST TECHNIQUE: Multidetector CT imaging of the abdomen and pelvis was performed following the standard protocol without IV contrast. RADIATION  DOSE REDUCTION: This exam was performed according to the departmental dose-optimization program which includes automated exposure control, adjustment of the mA and/or kV according to patient size and/or use of iterative reconstruction technique. COMPARISON:  Chest CT dated 12/17/2020. Abdomen and pelvis CT dated 12/16/2020. FINDINGS: Lower chest: Mildly enlarged heart. Small amount of residual atelectasis/scarring in the right lower lobe, including a somewhat oval wedge-shaped area at the posterior right lung base which is significantly smaller. Interval visualization of a 5 mm  nodular density at the right lateral costophrenic angle on image number 20/6. There is a similar area more anteriorly on image number 22/6. There is also an interval 11 x 8 mm sub solid nodular density in the right middle lobe on image number 9/6. There is also an interval nodular density in the left lower lobe with eccentric cavitation measuring 11 x 7 mm on image number 8/6. Hepatobiliary: No focal liver abnormality is seen. No gallstones, gallbladder wall thickening, or biliary dilatation. Pancreas: Unremarkable. No pancreatic ductal dilatation or surrounding inflammatory changes. Spleen: Normal in size without focal abnormality. Adrenals/Urinary Tract: An exophytic posterior left adrenal mass is again demonstrated. This measures 3.2 x 2.8 cm on image number 19/2, previously 3.2 x 2.6 cm. Stable normal appearing right adrenal gland. Interval mild dilatation of the right renal collecting system and ureter to the level of the ureterovesical junction with no obstructing stone or mass visualized. Stable normal appearing left kidney and ureter. The urinary bladder is poorly distended with mild to moderate diffuse wall thickening. Stomach/Bowel: A small hiatal hernia is unchanged. Unremarkable small bowel and colon. Normal appearing appendix. Vascular/Lymphatic: Bilateral enlarged pelvic and inguinal lymph nodes with progression. The largest is a left pelvic sidewall node with a short axis diameter of 17 mm on image number 75/2, previously 14 mm. Reproductive: A large left adnexal mass is again demonstrated with an interval increase in size. This measures 13.0 x 8.2 cm on image number 64/2, previously 10.8 x 8.8 cm. A right adnexal mass is again demonstrated, currently measuring 3.4 x 2.7 cm on image number 67/2, previously 2.9 cm in maximum diameter. Normal appearing uterus. Other: Small umbilical and infraumbilical hernias containing fat. Small left inguinal hernia containing fat. Musculoskeletal: Interval diffuse  heterogeneous bone sclerosis. IMPRESSION: 1. Increased size of bilateral ovarian masses, especially on the left. 2. Interval diffuse bony metastatic disease. 3. Progressive metastatic bilateral pelvic and inguinal lymphadenopathy. 4. Increased size of a left adrenal metastasis. 5. Interval nodular densities at the lung bases suspicious for metastases. 6. Interval mild right hydronephrosis and hydroureter to the level of the ureterovesical junction. This could be due to recent stone passage, obstruction by wall thickening of the bladder or a nonvisualized obstructing ureteral metastasis. Electronically Signed   By: Claudie Revering M.D.   On: 02/20/2021 18:28   CT HEAD WO CONTRAST (5MM)  Result Date: 02/22/2021 CLINICAL DATA:  Encephalopathy EXAM: CT HEAD WITHOUT CONTRAST TECHNIQUE: Contiguous axial images were obtained from the base of the skull through the vertex without intravenous contrast. RADIATION DOSE REDUCTION: This exam was performed according to the departmental dose-optimization program which includes automated exposure control, adjustment of the mA and/or kV according to patient size and/or use of iterative reconstruction technique. COMPARISON:  None. FINDINGS: Brain: There is no mass, hemorrhage or extra-axial collection. The size and configuration of the ventricles and extra-axial CSF spaces are normal. The brain parenchyma is normal, without acute or chronic infarction. Vascular: No abnormal hyperdensity of the major intracranial arteries or dural  venous sinuses. No intracranial atherosclerosis. Skull: The visualized skull base, calvarium and extracranial soft tissues are normal. Sinuses/Orbits: No fluid levels or advanced mucosal thickening of the visualized paranasal sinuses. No mastoid or middle ear effusion. The orbits are normal. IMPRESSION: Normal head CT. Electronically Signed   By: Ulyses Jarred M.D.   On: 02/22/2021 22:14   DG CHEST PORT 1 VIEW  Result Date: 02/20/2021 CLINICAL DATA:   History of gastric and ovarian cancer. Fatigue, nausea, vomiting, diarrhea EXAM: PORTABLE CHEST 1 VIEW COMPARISON:  12/31/2020 FINDINGS: Right Port-A-Cath in place with the tip at the cavoatrial junction. Heart is borderline in size. Mild vascular congestion. No confluent airspace opacities, effusions or overt edema. No acute bony abnormality. IMPRESSION: Borderline heart size.  Mild vascular congestion. Electronically Signed   By: Rolm Baptise M.D.   On: 02/20/2021 21:30     No future appointments.     LOS: 5 days

## 2021-02-26 ENCOUNTER — Encounter: Payer: Self-pay | Admitting: Licensed Clinical Social Worker

## 2021-02-26 DIAGNOSIS — I10 Essential (primary) hypertension: Secondary | ICD-10-CM | POA: Diagnosis not present

## 2021-02-26 DIAGNOSIS — R509 Fever, unspecified: Secondary | ICD-10-CM

## 2021-02-26 DIAGNOSIS — D696 Thrombocytopenia, unspecified: Secondary | ICD-10-CM

## 2021-02-26 DIAGNOSIS — D649 Anemia, unspecified: Secondary | ICD-10-CM | POA: Diagnosis not present

## 2021-02-26 DIAGNOSIS — G893 Neoplasm related pain (acute) (chronic): Secondary | ICD-10-CM | POA: Diagnosis not present

## 2021-02-26 LAB — CBC WITH DIFFERENTIAL/PLATELET
Abs Immature Granulocytes: 0.42 10*3/uL — ABNORMAL HIGH (ref 0.00–0.07)
Basophils Absolute: 0 10*3/uL (ref 0.0–0.1)
Basophils Relative: 0 %
Eosinophils Absolute: 0 10*3/uL (ref 0.0–0.5)
Eosinophils Relative: 0 %
HCT: 25.1 % — ABNORMAL LOW (ref 36.0–46.0)
Hemoglobin: 8.1 g/dL — ABNORMAL LOW (ref 12.0–15.0)
Immature Granulocytes: 3 %
Lymphocytes Relative: 7 %
Lymphs Abs: 1 10*3/uL (ref 0.7–4.0)
MCH: 29.8 pg (ref 26.0–34.0)
MCHC: 32.3 g/dL (ref 30.0–36.0)
MCV: 92.3 fL (ref 80.0–100.0)
Monocytes Absolute: 0.8 10*3/uL (ref 0.1–1.0)
Monocytes Relative: 5 %
Neutro Abs: 13 10*3/uL — ABNORMAL HIGH (ref 1.7–7.7)
Neutrophils Relative %: 85 %
Platelets: 49 10*3/uL — ABNORMAL LOW (ref 150–400)
RBC: 2.72 MIL/uL — ABNORMAL LOW (ref 3.87–5.11)
RDW: 18.2 % — ABNORMAL HIGH (ref 11.5–15.5)
WBC: 15.2 10*3/uL — ABNORMAL HIGH (ref 4.0–10.5)
nRBC: 0.2 % (ref 0.0–0.2)

## 2021-02-26 LAB — URINE CULTURE: Culture: <10000 — AB

## 2021-02-26 LAB — GLUCOSE, CAPILLARY
Glucose-Capillary: 136 mg/dL — ABNORMAL HIGH (ref 70–99)
Glucose-Capillary: 202 mg/dL — ABNORMAL HIGH (ref 70–99)
Glucose-Capillary: 113 mg/dL — ABNORMAL HIGH (ref 70–99)
Glucose-Capillary: 118 mg/dL — ABNORMAL HIGH (ref 70–99)

## 2021-02-26 MED ORDER — OXYCODONE HCL 5 MG PO TABS
5.0000 mg | ORAL_TABLET | ORAL | Status: DC | PRN
  Administered 2021-02-26 – 2021-02-28 (×5): 10 mg via ORAL
  Filled 2021-02-26 (×5): qty 2

## 2021-02-26 NOTE — Assessment & Plan Note (Addendum)
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 Patient follows with Dr. Benay Spice and is receiving chemotherapy. Recent imaging significant for progression of disease. Palliative care consulted (1/14) and decision made at that time was for full code/full scop treatment. Currently awaiting SNF - no offers, has been declined - SW/CM continuing to work on this Prior to this admission had been living with sister if performance status and nutritional status don't improve, she won't be candidate for chemo -would need to consider hospice. Complained of port tenderness/pain today to Dr. Benay Spice, will continue to monitor, may need to remove if continues

## 2021-02-26 NOTE — Progress Notes (Signed)
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 -Bladder neck, urethra, cervical biopsies 12/17/2020 -MSS, tumor mutation burden 4, K-ras amplification, PD-L1 tumor proportion score-0 -[facility] -90% intestinal malignancy (colorectal adenocarcinoma 85%) small intestine adenocarcinoma less than 5%, gastroesophageal adenocarcinoma not excluded -12/05/2020 MRI of the pelvis-poorly marginated enhancing 3.7 x 2.8 x 3.0 cm mass centered at the urethra, solid avidly enhancing bilateral ovarian masses, poorly marginated enhancing 2.4 x 2.3 x 3.0 cm mass in the left uterine cervix, mild to moderate bilateral common iliac, bilateral external iliac, and bilateral inguinal lymphadenopathy, diffuse patchy confluent nodular replacement of the pelvic osseous structures. -12/13/2020 CEA 1864, CA125 29.2 -12/16/2020 CT abdomen/pelvis-rapidly increasing size of the left ovary with some adjacent ascites, enlarging masses elsewhere, masslike area of the cervix and potentially within a urethral diverticulum, well-circumscribed left adrenal lesion measuring 3.2 x 2.6 cm, heterogeneous pattern of subtle sclerosis and lucency in the spine. -12/16/2020 MRI of the lumbar spine-diffusely abnormal appearance of the bone marrow throughout the visualized lumbar spine and pelvis highly suspicious for diffuse osseous metastatic disease, retroperitoneal/iliac adenopathy with left larger than right adnexal masses. -12/17/2020 CT chest-no acute intrathoracic pathology -Upper endoscopy 12/22/2020-gastritis, gastric nodule biopsy-adenocarcinoma, poorly differentiated with signet ring morphology -Colonoscopy 12/22/2020-polyps removed from the ascending and sigmoid colon, extrinsic compression of the sigmoid colon-tubulovillous adenoma without high-grade dysplasia, tubular adenoma and hyperplastic polyps -12/27/2020 cycle #1 FOLFOX -01/10/2021  cycle #2 FOLFOX -01/27/2021 cycle 3 FOLFOX, oxaliplatin dose reduced -02/20/2021 CT abdomen/pelvis without contrast-increased size of the bilateral ovarian masses, interval diffuse bony metastatic disease, progressive metastatic bilateral pelvic and inguinal lymphadenopathy, increased size of the left adrenal metastasis, interval nodular densities at the lung bases suspicious for metastases, interval mild right hydronephrosis and hydroureter -02/22/2021 CEA 440 2.  Abdominal pain secondary #1 3.  Constipation 4.  Normocytic anemia 5.  Leukocytosis 6.  Protein calorie malnutrition 7.  Obstructive sleep apnea 8.  Diabetes mellitus 9.  Hypertension 10.  Possible pulmonary hypertension noted on CT chest 11.  Hospital admission 02/20/2021-intractable nausea and vomiting 12.  Right hydronephrosis and hydroureter 13.  Thrombocytopenia 14.  Low-grade fever  Erin [name] appears stable.  She no longer has nausea or vomiting.  She is taking pain medication infrequently.  The platelet count is not back today.  No reported bleeding.  She has an intermittent low-grade fever, potentially related to "tumor "fever.  Erin [name] understands she will not be a candidate for further chemotherapy unless her performance status improves and the platelet count recovers.  Recommendations: 1.  We will follow-up on urine culture 2.  Check CBC today 3.  Increase out of bed and ambulation 4.  I will adjust the oxycodone dose since she is not taking oxycodone frequently and has developed somnolence with narcotics in the past    SUBJECTIVE: She denies nausea, pain, and bleeding.  She wants to go home.   Oncology History  Gastric cancer ([place])  12/27/2020 Initial Diagnosis   Gastric cancer ([place])   12/27/2020 -  Chemotherapy   Patient is on Treatment Plan : GASTRIC FOLFOX q14d x 12 cycles      PHYSICAL EXAMINATION:  Vitals:   02/25/21 2129 02/26/21 0516  BP: 119/67 120/64  Pulse: 89 (!) 101  Resp: 17 17   Temp: (!) 100.4 F (38 C) 100.2 F (37.9 C)  SpO2: 95% 96%   Filed Weights   02/24/21 0500 02/25/21 0600 02/26/21 0300  Weight: 243 lb 6.2 oz (110.4 kg)  245 lb 9.5 oz (111.4 kg) 247 lb 2.2 oz (112.1 kg)    Intake/Output from previous day: 01/17 0701 - 01/18 0700 In: 240 [P.O.:240] Out: 850 [Urine:850] HEENT: No thrush or bleeding Abdomen: Soft, tender in the left lower abdomen Vascular: No leg edema Skin: Petechiae at the legs bilaterally  Port-A-Cath site without erythema  LABORATORY DATA:  I have reviewed the data as listed CMP Latest Ref Rng & Units 02/25/2021 02/22/2021 02/22/2021  Glucose 70 - 99 mg/dL 151(H) - 113(H)  BUN 6 - 20 mg/dL 21(H) - 20  Creatinine 0.44 - 1.00 mg/dL 0.99 - 0.99  Sodium 135 - 145 mmol/L 131(L) - 135  Potassium 3.5 - 5.1 mmol/L 3.2(L) - 3.2(L)  Chloride 98 - 111 mmol/L 102 - 104  CO2 22 - 32 mmol/L 23 - 24  Calcium 8.9 - 10.3 mg/dL 8.2(L) - 8.7(L)  Total Protein 6.5 - 8.1 g/dL - 5.8(L) 5.8(L)  Total Bilirubin 0.3 - 1.2 mg/dL - 1.5(H) 1.8(H)  Alkaline Phos 38 - 126 U/L - 227(H) 228(H)  AST 15 - 41 U/L - 19 19  ALT 0 - 44 U/L - 6 6    Lab Results  Component Value Date   WBC 11.4 (H) 02/25/2021   HGB 7.5 (L) 02/25/2021   HCT 23.6 (L) 02/25/2021   MCV 92.5 02/25/2021   PLT 24 (LL) 02/25/2021   NEUTROABS 11.7 (H) 02/20/2021    Lab Results  Component Value Date   CEA1 440.0 (H) 02/22/2021   CEA 617.97 (H) 02/11/2021    CT Abdomen Pelvis Wo Contrast  Result Date: 02/20/2021 CLINICAL DATA:  Acute, non localized abdominal pain. Nausea, vomiting, diarrhea and fatigue for the past week. Receiving chemotherapy for gastric and ovarian cancer. EXAM: CT ABDOMEN AND PELVIS WITHOUT CONTRAST TECHNIQUE: Multidetector CT imaging of the abdomen and pelvis was performed following the standard protocol without IV contrast. RADIATION DOSE REDUCTION: This exam was performed according to the departmental dose-optimization program which includes automated  exposure control, adjustment of the mA and/or kV according to patient size and/or use of iterative reconstruction technique. COMPARISON:  Chest CT dated 12/17/2020. Abdomen and pelvis CT dated 12/16/2020. FINDINGS: Lower chest: Mildly enlarged heart. Small amount of residual atelectasis/scarring in the right lower lobe, including a somewhat oval wedge-shaped area at the posterior right lung base which is significantly smaller. Interval visualization of a 5 mm nodular density at the right lateral costophrenic angle on image number 20/6. There is a similar area more anteriorly on image number 22/6. There is also an interval 11 x 8 mm sub solid nodular density in the right middle lobe on image number 9/6. There is also an interval nodular density in the left lower lobe with eccentric cavitation measuring 11 x 7 mm on image number 8/6. Hepatobiliary: No focal liver abnormality is seen. No gallstones, gallbladder wall thickening, or biliary dilatation. Pancreas: Unremarkable. No pancreatic ductal dilatation or surrounding inflammatory changes. Spleen: Normal in size without focal abnormality. Adrenals/Urinary Tract: An exophytic posterior left adrenal mass is again demonstrated. This measures 3.2 x 2.8 cm on image number 19/2, previously 3.2 x 2.6 cm. Stable normal appearing right adrenal gland. Interval mild dilatation of the right renal collecting system and ureter to the level of the ureterovesical junction with no obstructing stone or mass visualized. Stable normal appearing left kidney and ureter. The urinary bladder is poorly distended with mild to moderate diffuse wall thickening. Stomach/Bowel: A small hiatal hernia is unchanged. Unremarkable small bowel and colon. Normal  appearing appendix. Vascular/Lymphatic: Bilateral enlarged pelvic and inguinal lymph nodes with progression. The largest is a left pelvic sidewall node with a short axis diameter of 17 mm on image number 75/2, previously 14 mm. Reproductive: A  large left adnexal mass is again demonstrated with an interval increase in size. This measures 13.0 x 8.2 cm on image number 64/2, previously 10.8 x 8.8 cm. A right adnexal mass is again demonstrated, currently measuring 3.4 x 2.7 cm on image number 67/2, previously 2.9 cm in maximum diameter. Normal appearing uterus. Other: Small umbilical and infraumbilical hernias containing fat. Small left inguinal hernia containing fat. Musculoskeletal: Interval diffuse heterogeneous bone sclerosis. IMPRESSION: 1. Increased size of bilateral ovarian masses, especially on the left. 2. Interval diffuse bony metastatic disease. 3. Progressive metastatic bilateral pelvic and inguinal lymphadenopathy. 4. Increased size of a left adrenal metastasis. 5. Interval nodular densities at the lung bases suspicious for metastases. 6. Interval mild right hydronephrosis and hydroureter to the level of the ureterovesical junction. This could be due to recent stone passage, obstruction by wall thickening of the bladder or a nonvisualized obstructing ureteral metastasis. Electronically Signed   By: Claudie Revering M.D.   On: 02/20/2021 18:28   CT HEAD WO CONTRAST (5MM)  Result Date: 02/22/2021 CLINICAL DATA:  Encephalopathy EXAM: CT HEAD WITHOUT CONTRAST TECHNIQUE: Contiguous axial images were obtained from the base of the skull through the vertex without intravenous contrast. RADIATION DOSE REDUCTION: This exam was performed according to the departmental dose-optimization program which includes automated exposure control, adjustment of the mA and/or kV according to patient size and/or use of iterative reconstruction technique. COMPARISON:  None. FINDINGS: Brain: There is no mass, hemorrhage or extra-axial collection. The size and configuration of the ventricles and extra-axial CSF spaces are normal. The brain parenchyma is normal, without acute or chronic infarction. Vascular: No abnormal hyperdensity of the major intracranial arteries or dural  venous sinuses. No intracranial atherosclerosis. Skull: The visualized skull base, calvarium and extracranial soft tissues are normal. Sinuses/Orbits: No fluid levels or advanced mucosal thickening of the visualized paranasal sinuses. No mastoid or middle ear effusion. The orbits are normal. IMPRESSION: Normal head CT. Electronically Signed   By: Ulyses Jarred M.D.   On: 02/22/2021 22:14   DG CHEST PORT 1 VIEW  Result Date: 02/20/2021 CLINICAL DATA:  History of gastric and ovarian cancer. Fatigue, nausea, vomiting, diarrhea EXAM: PORTABLE CHEST 1 VIEW COMPARISON:  12/31/2020 FINDINGS: Right Port-A-Cath in place with the tip at the cavoatrial junction. Heart is borderline in size. Mild vascular congestion. No confluent airspace opacities, effusions or overt edema. No acute bony abnormality. IMPRESSION: Borderline heart size.  Mild vascular congestion. Electronically Signed   By: Rolm Baptise M.D.   On: 02/20/2021 21:30     No future appointments.     LOS: 6 days

## 2021-02-26 NOTE — Assessment & Plan Note (Addendum)
Potassium supplementation as needed

## 2021-02-26 NOTE — Progress Notes (Signed)
Physical Therapy Treatment Patient Details Name: Erin [name] MRN: [id number] DOB: 1965/04/19 Today's Date: 02/26/2021   History of Present Illness 56 y.o. 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  CT scan of the abdomen and pelvis showed increasing pulmonary mass with new bone metastases and increased in size of left adrenal metastases as well as along nodules.  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  Pt with very slow processing and not responding 95% of the time.  Required max A, increased multimodal cues, and time to get to EOB.  At EOB, continued to offer encouragement for activity but pt not responding and even seemed to get a little aggravated by therapist.  Returned to supine as pt unable to further participate.  Did update recommendation to SNF.     Recommendations for follow up therapy are one component of a multi-disciplinary discharge planning process, led by the attending physician.  Recommendations may be updated based on patient status, additional functional criteria and insurance authorization.  Follow Up Recommendations  Skilled nursing-short term rehab (<3 hours/day)     Assistance Recommended at Discharge Frequent or constant Supervision/Assistance  Patient can return home with the following A lot of help with walking  and/or transfers;A lot of help with bathing/dressing/bathroom;Help with stairs or ramp for entrance   Equipment Recommendations  None recommended by PT    Recommendations for Other Services       Precautions / Restrictions Precautions Precautions: Fall     Mobility  Bed Mobility Overal bed mobility: Needs Assistance Bed Mobility: Supine to Sit, Sit to Supine     Supine to sit: Max assist Sit to supine: Max assist   General bed mobility comments: significantly increased time with multimodal cues for activity then little effort from pt to sit EOB; max cues for repositioning    Transfers                   General transfer comment: declined/unable to respond despite multiple cues/methods/increased time - pt seemed to get aggravated with therapist encouragement for activity    Ambulation/Gait                   Stairs             Wheelchair Mobility    Modified Rankin (Stroke Patients Only)       Balance Overall balance assessment: Needs assistance Sitting-balance support: No upper extremity supported Sitting balance-Leahy Scale: Fair         Standing balance comment: unable today                            Cognition Arousal/Alertness: Awake/alert Behavior During Therapy: Flat affect Overall Cognitive Status: Impaired/Different from baseline  General Comments: Difficult to fully assist as pt largely non-verbal during evaluation.  She did not respond to questions, only responded to tactile cues for transfers with significantly increased tiem,  kept arms crossed most of session, did shake her head "no" to ambulation but unable to express what is wrong (even when asked pain?, fatigue?, etc? pt not responding).  Only response was when pt seemed to be getting aggravated by PT trying to encourage activity she stated "miss lady" but never finished statment        Exercises      General  Comments        Pertinent Vitals/Pain Pain Assessment Pain Assessment: Faces Faces Pain Scale: No hurt    Home Living                          Prior Function            PT Goals (current goals can now be found in the care plan section) Progress towards PT goals: Not progressing toward goals - comment (limited by cognition)    Frequency    Min 3X/week      PT Plan Discharge plan needs to be updated    Co-evaluation              AM-PAC PT "6 Clicks" Mobility   Outcome Measure  Help needed turning from your back to your side while in a flat bed without using bedrails?: A Lot Help needed moving from lying on your back to sitting on the side of a flat bed without using bedrails?: Total Help needed moving to and from a bed to a chair (including a wheelchair)?: Total Help needed standing up from a chair using your arms (e.g., wheelchair or bedside chair)?: Total Help needed to walk in hospital room?: Total Help needed climbing 3-5 steps with a railing? : Total 6 Click Score: 7    End of Session Equipment Utilized During Treatment: Gait belt Activity Tolerance: Other (comment) (limited by cognition) Patient left: in bed;with call bell/phone within reach;with bed alarm set Nurse Communication: Mobility status PT Visit Diagnosis: Difficulty in walking, not elsewhere classified (R26.2);Muscle weakness (generalized) (M62.81)     Time: 1413-1430 PT Time Calculation (min) (ACUTE ONLY): 17 min  Charges:  $Therapeutic Activity: 8-22 mins                     Abran Richard, PT Acute Rehab Services Pager [phone] [name] [phone]    Erin [name] 02/26/2021, 2:40 PM

## 2021-02-26 NOTE — Progress Notes (Signed)
[place] Progress Note  Clinical Education officer, museum contacted patient by phone to update her on [employer] appointment  CSW spoke with patient who is currently inpatient at [facility], and updated her on the change of her appointment with [facility]. CSW stated I left a voicemail for Cecille Rubin at the [facility] and she will contact the patient to reschedule appointment. Patient verbalized understanding.    Erin [name] , LCSW

## 2021-02-26 NOTE — Assessment & Plan Note (Addendum)
Continue Oxycodone prn

## 2021-02-26 NOTE — Assessment & Plan Note (Addendum)
Patient is on hydrochlorothiazide, Coreg and amlodipine as an outpatient. Coreg and amlodipine continued. Blood pressure mostly well controlled but with hypotension in setting of worsened anemia and sepsis. -Continue Coreg - hold HCTZ and amlodipine

## 2021-02-26 NOTE — Assessment & Plan Note (Addendum)
Patient continues to have poor oral intake which may affect her candidacy for future treatment of cancer.  Dietitian recommendations: 1. Continue Boost Breeze TID and Prosource Plus BID. 2. Continue to provide encouragement with eating and drinking as able.

## 2021-02-26 NOTE — Progress Notes (Signed)
PROGRESS NOTE    DONNAH LEVERT  DPO:[id number] DOB: 07-24-1965 DOA: 02/20/2021 PCP: Merryl Hacker, No   Brief Narrative: Erin [name] is a 56 y.o. 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 Patient presented secondary to nausea/vomiting/diarrhea and abdominal pain. Hospitalization complicated by recurrent anemia. Fecal occult blood test negative. Patient has received 3 units of PRBC to date. Hemoglobin currently stabilized. Platelets improving.   Assessment & Plan:   * Severe anemia- (present on admission) Secondary to chemotherapy. Hemoglobin of 6.0 on admission. Patient has received 3 units of PRBC to date. Hemoglobin up to 8 and is currently stable.  Thrombocytopenia ([place]) Related to chemotherapy. Improved today.  Protein-calorie malnutrition, severe Dietitian recommendations: Continue Boost Breeze TID and Prosource Plus BID. Continue to provide encouragement with eating and drinking as able.   Fever- (present on admission) Possibly related tumor fever. No obvious source for active infection.  Cancer related pain- (present on admission) -Continue Oxycodone prn  Metastatic adenocarcinoma of unknown origin [facility])- (present on admission) Patient follows with Dr. Benay Spice and is receiving chemotherapy. Recent imaging significant for progression of disease.  Hypokalemia- (present on admission) -Potassium supplementation as needed  Diabetes mellitus (HCC) Hemoglobin A1C of 7.1 -Continue SSI  Essential hypertension- (present on admission) Patient is on hydrochlorothiazide, Coreg and amlodipine as an outpatient. Coreg and amlodipine continued. Blood pressure well controlled. -Continue amlodipine and Coreg     DVT prophylaxis: SCDs Code Status:   Code Status: Full Code Family Communication: None at bedside Disposition Plan: Discharge to SNF  likely in 1-3 days pending hematology/oncology recommendations   Consultants:  Medical oncology Palliative care medicine  Procedures:  None  Antimicrobials: Fever    Subjective: No concerns today  Objective: Vitals:   02/25/21 2129 02/26/21 0300 02/26/21 0516 02/26/21 1314  BP: 119/67  120/64 109/61  Pulse: 89  (!) 101 97  Resp: 17  17 (!) 21  Temp: (!) 100.4 F (38 C)  100.2 F (37.9 C) 98.2 F (36.8 C)  TempSrc: Oral  Oral Oral  SpO2: 95%  96% 97%  Weight:  112.1 kg    Height:        Intake/Output Summary (Last 24 hours) at 02/26/2021 1930 Last data filed at 02/26/2021 0516 Gross per 24 hour  Intake --  Output 300 ml  Net -300 ml   Filed Weights   02/24/21 0500 02/25/21 0600 02/26/21 0300  Weight: 110.4 kg 111.4 kg 112.1 kg    Examination:  General exam: Appears calm and comfortable  Respiratory system: Clear to auscultation. Respiratory effort normal. Cardiovascular system: S1 & S2 heard, RRR. No murmurs, rubs, gallops or clicks. Gastrointestinal system: Abdomen is soft and nontender. Normal bowel sounds heard. Central nervous system: Alert and oriented. No focal neurological deficits. Musculoskeletal: No calf tenderness Skin: No cyanosis. No rashes Psychiatry: Judgement and insight appear normal. Mood & affect appropriate.     Data Reviewed: I have personally reviewed following labs and imaging studies  CBC Lab Results  Component Value Date   WBC 15.2 (H) 02/26/2021   RBC 2.72 (L) 02/26/2021   HGB 8.1 (L) 02/26/2021   HCT 25.1 (L) 02/26/2021   MCV 92.3 02/26/2021   MCH 29.8 02/26/2021   PLT 49 (L) 02/26/2021   MCHC 32.3 02/26/2021   RDW 18.2 (H) 02/26/2021   LYMPHSABS 1.0 02/26/2021   MONOABS 0.8 02/26/2021   EOSABS 0.0 02/26/2021   BASOSABS 0.0  99/83/3825     Last metabolic panel Lab Results  Component Value Date   NA 131 (L) 02/25/2021   K 3.2 (L) 02/25/2021   CL 102 02/25/2021   CO2 23 02/25/2021   BUN 21 (H) 02/25/2021    CREATININE 0.99 02/25/2021   GLUCOSE 151 (H) 02/25/2021   GFRNONAA >60 02/25/2021   GFRAA 78 03/08/2020   CALCIUM 8.2 (L) 02/25/2021   PHOS 2.6 02/22/2021   PROT 5.8 (L) 02/22/2021   ALBUMIN 1.9 (L) 02/22/2021   LABGLOB 3.0 03/08/2020   AGRATIO 1.3 03/08/2020   BILITOT 1.5 (H) 02/22/2021   ALKPHOS 227 (H) 02/22/2021   AST 19 02/22/2021   ALT 6 02/22/2021   ANIONGAP 6 02/25/2021    CBG (last 3)  Recent Labs    02/26/21 0737 02/26/21 1138 02/26/21 1708  GLUCAP 136* 202* 113*     GFR: Estimated Creatinine Clearance: 79.7 mL/min (by C-G formula based on SCr of 0.99 mg/dL).  Coagulation Profile: Recent Labs  Lab 02/24/21 1307  INR 1.6*    Recent Results (from the past 240 hour(s))  Resp Panel by RT-PCR (Flu A&B, Covid) Nasopharyngeal Swab     Status: None   Collection Time: 02/20/21  5:46 PM   Specimen: Nasopharyngeal Swab; Nasopharyngeal(NP) swabs in vial transport medium  Result Value Ref Range Status   SARS Coronavirus 2 by RT PCR NEGATIVE NEGATIVE Final    Comment: (NOTE) SARS-CoV-2 target nucleic acids are NOT DETECTED.  The SARS-CoV-2 RNA is generally detectable in upper respiratory specimens during the acute phase of infection. The lowest concentration of SARS-CoV-2 viral copies this assay can detect is 138 copies/mL. A negative result does not preclude SARS-Cov-2 infection and should not be used as the sole basis for treatment or other patient management decisions. A negative result may occur with  improper specimen collection/handling, submission of specimen other than nasopharyngeal swab, presence of viral mutation(s) within the areas targeted by this assay, and inadequate number of viral copies(<138 copies/mL). A negative result must be combined with clinical observations, patient history, and epidemiological information. The expected result is Negative.  Fact Sheet for Patients:  EntrepreneurPulse.com.au  Fact Sheet for Healthcare  Providers:  IncredibleEmployment.be  This test is no t yet approved or cleared by the [place] FDA and  has been authorized for detection and/or diagnosis of SARS-CoV-2 by FDA under an Emergency Use Authorization (EUA). This EUA will remain  in effect (meaning this test can be used) for the duration of the COVID-19 declaration under Section 564(b)(1) of the Act, 21 U.S.C.section 360bbb-3(b)(1), unless the authorization is terminated  or revoked sooner.       Influenza A by PCR NEGATIVE NEGATIVE Final   Influenza B by PCR NEGATIVE NEGATIVE Final    Comment: (NOTE) The Xpert Xpress SARS-CoV-2/FLU/RSV plus assay is intended as an aid in the diagnosis of influenza from Nasopharyngeal swab specimens and should not be used as a sole basis for treatment. Nasal washings and aspirates are unacceptable for Xpert Xpress SARS-CoV-2/FLU/RSV testing.  Fact Sheet for Patients: EntrepreneurPulse.com.au  Fact Sheet for Healthcare Providers: IncredibleEmployment.be  This test is not yet approved or cleared by the [place] FDA and has been authorized for detection and/or diagnosis of SARS-CoV-2 by FDA under an Emergency Use Authorization (EUA). This EUA will remain in effect (meaning this test can be used) for the duration of the COVID-19 declaration under Section 564(b)(1) of the Act, 21 U.S.C. section 360bbb-3(b)(1), unless the authorization is terminated or revoked.  Performed at [facility], [place] [address], [place], [place] [address]   Urine Culture     Status: Abnormal   Collection Time: 02/24/21  9:49 AM   Specimen: Urine, Clean Catch  Result Value Ref Range Status   Specimen Description   Final    URINE, CLEAN CATCH Performed at [facility], [place] [address], [place], [place] [address]    Special Requests   Final    NONE Performed at [facility], [place]  [address] [place], [place] [address]    Culture (A)  Final    <10,000 COLONIES/mL INSIGNIFICANT GROWTH Performed at [place] [address], [place], [place] [address]    Report Status 02/26/2021 FINAL  Final        Radiology Studies: No results found.      Scheduled Meds:  (feeding supplement) PROSource Plus  30 mL Oral BID BM   amLODipine  5 mg Oral Daily   carvedilol  6.25 mg Oral BID WC   Chlorhexidine Gluconate Cloth  6 each Topical Daily   feeding supplement  1 Container Oral TID BM   insulin aspart  0-15 Units Subcutaneous TID WC   insulin aspart  0-5 Units Subcutaneous QHS   multivitamin with minerals  1 tablet Oral Daily   pantoprazole  40 mg Oral Daily   phytonadione  10 mg Oral Daily   polyethylene glycol  17 g Oral BID   sorbitol  60 mL Oral Daily   Continuous Infusions:   LOS: 6 days     Cordelia Poche, MD [facility] 02/26/2021, 7:30 PM  If 7PM-7AM, please contact night-coverage [email or web address]

## 2021-02-26 NOTE — Hospital Course (Addendum)
Erin [name] is a 56 y.o. 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 Patient presented secondary to nausea/vomiting/diarrhea and abdominal pain. Hospitalization complicated by recurrent anemia. Fecal occult blood test negative. Patient has received 5 units of PRBC to date. Platelets improving. Now found to have E. Faecalis bacteremia.

## 2021-02-26 NOTE — Assessment & Plan Note (Addendum)
Hemoglobin A1C of 7.1 Continue SSI

## 2021-02-26 NOTE — Assessment & Plan Note (Addendum)
Secondary to chemotherapy.  Continues to fluctuate, trend Patient has received 6 units of PRBC to date.

## 2021-02-26 NOTE — Assessment & Plan Note (Addendum)
Related to chemotherapy. resolved.

## 2021-02-26 NOTE — Assessment & Plan Note (Addendum)
Not present on admission. Unsure of source. Port site does not appear infected. Associated leukocytosis, fevers, hypotension. Blood cultures (1/21) positive for GPCs with BCID suggesting enterococcus faecalis. Started on ampicillin IV. -See problem, Bacteremia due to Enterococcus

## 2021-02-27 ENCOUNTER — Inpatient Hospital Stay: Payer: 59

## 2021-02-27 DIAGNOSIS — G893 Neoplasm related pain (acute) (chronic): Secondary | ICD-10-CM | POA: Diagnosis not present

## 2021-02-27 DIAGNOSIS — D649 Anemia, unspecified: Secondary | ICD-10-CM | POA: Diagnosis not present

## 2021-02-27 DIAGNOSIS — R509 Fever, unspecified: Secondary | ICD-10-CM | POA: Diagnosis not present

## 2021-02-27 DIAGNOSIS — I10 Essential (primary) hypertension: Secondary | ICD-10-CM | POA: Diagnosis not present

## 2021-02-27 LAB — CBC WITH DIFFERENTIAL/PLATELET
Abs Immature Granulocytes: 0.32 10*3/uL — ABNORMAL HIGH (ref 0.00–0.07)
Basophils Absolute: 0 10*3/uL (ref 0.0–0.1)
Basophils Relative: 0 %
Eosinophils Absolute: 0 10*3/uL (ref 0.0–0.5)
Eosinophils Relative: 0 %
HCT: 22.3 % — ABNORMAL LOW (ref 36.0–46.0)
Hemoglobin: 7.2 g/dL — ABNORMAL LOW (ref 12.0–15.0)
Immature Granulocytes: 2 %
Lymphocytes Relative: 7 %
Lymphs Abs: 1.1 10*3/uL (ref 0.7–4.0)
MCH: 30.1 pg (ref 26.0–34.0)
MCHC: 32.3 g/dL (ref 30.0–36.0)
MCV: 93.3 fL (ref 80.0–100.0)
Monocytes Absolute: 0.9 10*3/uL (ref 0.1–1.0)
Monocytes Relative: 5 %
Neutro Abs: 13.5 10*3/uL — ABNORMAL HIGH (ref 1.7–7.7)
Neutrophils Relative %: 86 %
Platelets: 48 10*3/uL — ABNORMAL LOW (ref 150–400)
RBC: 2.39 MIL/uL — ABNORMAL LOW (ref 3.87–5.11)
RDW: 18.4 % — ABNORMAL HIGH (ref 11.5–15.5)
WBC: 15.8 10*3/uL — ABNORMAL HIGH (ref 4.0–10.5)
nRBC: 0.2 % (ref 0.0–0.2)

## 2021-02-27 LAB — BASIC METABOLIC PANEL
Anion gap: 8 (ref 5–15)
BUN: 20 mg/dL (ref 6–20)
CO2: 22 mmol/L (ref 22–32)
Calcium: 8.4 mg/dL — ABNORMAL LOW (ref 8.9–10.3)
Chloride: 104 mmol/L (ref 98–111)
Creatinine, Ser: 0.85 mg/dL (ref 0.44–1.00)
GFR, Estimated: >60 mL/min (ref >60)
Glucose, Bld: 117 mg/dL — ABNORMAL HIGH (ref 70–99)
Potassium: 3.3 mmol/L — ABNORMAL LOW (ref 3.5–5.1)
Sodium: 134 mmol/L — ABNORMAL LOW (ref 135–145)

## 2021-02-27 LAB — MAGNESIUM: Magnesium: 2 mg/dL (ref 1.7–2.4)

## 2021-02-27 LAB — DIC (DISSEMINATED INTRAVASCULAR COAGULATION)PANEL
D-Dimer, Quant: 15.93 ug/mL-FEU — ABNORMAL HIGH (ref 0.00–0.50)
Fibrinogen: 424 mg/dL (ref 210–475)
INR: 1.6 — ABNORMAL HIGH (ref 0.8–1.2)
Platelets: 49 10*3/uL — ABNORMAL LOW (ref 150–400)
Prothrombin Time: 18.7 seconds — ABNORMAL HIGH (ref 11.4–15.2)
Smear Review: NONE SEEN
aPTT: 33 seconds (ref 24–36)

## 2021-02-27 LAB — GLUCOSE, CAPILLARY
Glucose-Capillary: 123 mg/dL — ABNORMAL HIGH (ref 70–99)
Glucose-Capillary: 138 mg/dL — ABNORMAL HIGH (ref 70–99)
Glucose-Capillary: 106 mg/dL — ABNORMAL HIGH (ref 70–99)
Glucose-Capillary: 113 mg/dL — ABNORMAL HIGH (ref 70–99)

## 2021-02-27 LAB — PREPARE RBC (CROSSMATCH)

## 2021-02-27 MED ORDER — SODIUM CHLORIDE 0.9% IV SOLUTION: Freq: Once | INTRAVENOUS | Status: AC

## 2021-02-27 MED ORDER — POTASSIUM CHLORIDE CRYS ER 20 MEQ PO TBCR
40.0000 meq | EXTENDED_RELEASE_TABLET | ORAL | Status: AC
  Administered 2021-02-27: 40 meq via ORAL
  Filled 2021-02-27: qty 2

## 2021-02-27 NOTE — Progress Notes (Signed)
PROGRESS NOTE    Erin [name]  OBS:[id number] DOB: December 15, 1965 DOA: 02/20/2021 PCP: Merryl Hacker, No   Brief Narrative: Erin [name] is a 56 y.o. 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 Patient presented secondary to nausea/vomiting/diarrhea and abdominal pain. Hospitalization complicated by recurrent anemia. Fecal occult blood test negative. Patient has received 3 units of PRBC to date. Hemoglobin currently stabilized. Platelets improving.   Assessment & Plan:   * Severe anemia- (present on admission) Secondary to chemotherapy. Hemoglobin of 6.0 on admission. Patient has received 3 units of PRBC to date. Hemoglobin up to 8 with downtrend this morning. Transfusion (4th unit of PRBC) ordered by hematology/oncology  Thrombocytopenia ([place]) Related to chemotherapy. Stable.  Protein-calorie malnutrition, severe Dietitian recommendations: Continue Boost Breeze TID and Prosource Plus BID. Continue to provide encouragement with eating and drinking as able.   Fever- (present on admission) Possibly related tumor fever. No obvious source for active infection.  Cancer related pain- (present on admission) -Continue Oxycodone prn  Metastatic adenocarcinoma of unknown origin [facility])- (present on admission) Patient follows with Dr. Benay Spice and is receiving chemotherapy. Recent imaging significant for progression of disease.  Hypokalemia- (present on admission) -Potassium supplementation as needed  Diabetes mellitus (HCC) Hemoglobin A1C of 7.1 -Continue SSI  Essential hypertension- (present on admission) Patient is on hydrochlorothiazide, Coreg and amlodipine as an outpatient. Coreg and amlodipine continued. Blood pressure well controlled. -Continue amlodipine and Coreg     DVT prophylaxis: SCDs Code Status:   Code Status: Full Code Family  Communication: None at bedside Disposition Plan: Discharge to SNF likely in 1-2 days pending stable hemoglobin   Consultants:  Medical oncology Palliative care medicine  Procedures:  None  Antimicrobials: Fever    Subjective: No issues overnight.  Objective: Vitals:   02/27/21 0604 02/27/21 1237 02/27/21 1328 02/27/21 1357  BP: 125/74 105/67 102/61 99/66  Pulse: (!) 101 (!) 101 (!) 101 93  Resp: 17 (!) 26 20 20   Temp: 98.5 F (36.9 C) 100.1 F (37.8 C) 97.9 F (36.6 C) 99.3 F (37.4 C)  TempSrc: Oral Oral Axillary Axillary  SpO2: 100% 94% 95% 94%  Weight:      Height:        Intake/Output Summary (Last 24 hours) at 02/27/2021 1805 Last data filed at 02/27/2021 1553 Gross per 24 hour  Intake 872 ml  Output 675 ml  Net 197 ml    Filed Weights   02/25/21 0600 02/26/21 0300 02/27/21 0500  Weight: 111.4 kg 112.1 kg 111.4 kg    Examination:  General exam: Appears calm and comfortable Respiratory system: Clear to auscultation. Respiratory effort normal. Cardiovascular system: S1 & S2 heard, RRR. No murmurs, rubs, gallops or clicks. Gastrointestinal system: Abdomen is nondistended, soft and nontender. No organomegaly or masses felt. Normal bowel sounds heard. Central nervous system: Alert and oriented. No focal neurological deficits. Musculoskeletal: No calf tenderness Skin: No cyanosis. No rashes Psychiatry: Judgement and insight appear normal. Mood & affect appropriate.     Data Reviewed: I have personally reviewed following labs and imaging studies  CBC Lab Results  Component Value Date   WBC 15.8 (H) 02/27/2021   RBC 2.39 (L) 02/27/2021   HGB 7.2 (L) 02/27/2021   HCT 22.3 (L) 02/27/2021   MCV 93.3 02/27/2021   MCH 30.1 02/27/2021   PLT 48 (L) 02/27/2021   PLT 49 (L) 02/27/2021   MCHC 32.3 02/27/2021  RDW 18.4 (H) 02/27/2021   LYMPHSABS 1.1 02/27/2021   MONOABS 0.9 02/27/2021   EOSABS 0.0 02/27/2021   BASOSABS 0.0 02/27/2021     Last  metabolic panel Lab Results  Component Value Date   NA 134 (L) 02/27/2021   K 3.3 (L) 02/27/2021   CL 104 02/27/2021   CO2 22 02/27/2021   BUN 20 02/27/2021   CREATININE 0.85 02/27/2021   GLUCOSE 117 (H) 02/27/2021   GFRNONAA >60 02/27/2021   GFRAA 78 03/08/2020   CALCIUM 8.4 (L) 02/27/2021   PHOS 2.6 02/22/2021   PROT 5.8 (L) 02/22/2021   ALBUMIN 1.9 (L) 02/22/2021   LABGLOB 3.0 03/08/2020   AGRATIO 1.3 03/08/2020   BILITOT 1.5 (H) 02/22/2021   ALKPHOS 227 (H) 02/22/2021   AST 19 02/22/2021   ALT 6 02/22/2021   ANIONGAP 8 02/27/2021    CBG (last 3)  Recent Labs    02/27/21 0745 02/27/21 1137 02/27/21 1635  GLUCAP 123* 138* 106*      GFR: Estimated Creatinine Clearance: 91.4 mL/min (by C-G formula based on SCr of 0.85 mg/dL).  Coagulation Profile: Recent Labs  Lab 02/24/21 1307 02/27/21 0321  INR 1.6* 1.6*     Recent Results (from the past 240 hour(s))  Resp Panel by RT-PCR (Flu A&B, Covid) Nasopharyngeal Swab     Status: None   Collection Time: 02/20/21  5:46 PM   Specimen: Nasopharyngeal Swab; Nasopharyngeal(NP) swabs in vial transport medium  Result Value Ref Range Status   SARS Coronavirus 2 by RT PCR NEGATIVE NEGATIVE Final    Comment: (NOTE) SARS-CoV-2 target nucleic acids are NOT DETECTED.  The SARS-CoV-2 RNA is generally detectable in upper respiratory specimens during the acute phase of infection. The lowest concentration of SARS-CoV-2 viral copies this assay can detect is 138 copies/mL. A negative result does not preclude SARS-Cov-2 infection and should not be used as the sole basis for treatment or other patient management decisions. A negative result may occur with  improper specimen collection/handling, submission of specimen other than nasopharyngeal swab, presence of viral mutation(s) within the areas targeted by this assay, and inadequate number of viral copies(<138 copies/mL). A negative result must be combined with clinical  observations, patient history, and epidemiological information. The expected result is Negative.  Fact Sheet for Patients:  EntrepreneurPulse.com.au  Fact Sheet for Healthcare Providers:  IncredibleEmployment.be  This test is no t yet approved or cleared by the [place] FDA and  has been authorized for detection and/or diagnosis of SARS-CoV-2 by FDA under an Emergency Use Authorization (EUA). This EUA will remain  in effect (meaning this test can be used) for the duration of the COVID-19 declaration under Section 564(b)(1) of the Act, 21 U.S.C.section 360bbb-3(b)(1), unless the authorization is terminated  or revoked sooner.       Influenza A by PCR NEGATIVE NEGATIVE Final   Influenza B by PCR NEGATIVE NEGATIVE Final    Comment: (NOTE) The Xpert Xpress SARS-CoV-2/FLU/RSV plus assay is intended as an aid in the diagnosis of influenza from Nasopharyngeal swab specimens and should not be used as a sole basis for treatment. Nasal washings and aspirates are unacceptable for Xpert Xpress SARS-CoV-2/FLU/RSV testing.  Fact Sheet for Patients: EntrepreneurPulse.com.au  Fact Sheet for Healthcare Providers: IncredibleEmployment.be  This test is not yet approved or cleared by the [place] FDA and has been authorized for detection and/or diagnosis of SARS-CoV-2 by FDA under an Emergency Use Authorization (EUA). This EUA will remain in effect (meaning this test can  be used) for the duration of the COVID-19 declaration under Section 564(b)(1) of the Act, 21 U.S.C. section 360bbb-3(b)(1), unless the authorization is terminated or revoked.  Performed at [facility], [place] [address], [place], [place] [address]   Urine Culture     Status: Abnormal   Collection Time: 02/24/21  9:49 AM   Specimen: Urine, Clean Catch  Result Value Ref Range Status   Specimen Description   Final    URINE,  CLEAN CATCH Performed at [facility], [place] [address], [place], [place] [address]    Special Requests   Final    NONE Performed at [facility], [place] [address] [place], [place] [address]    Culture (A)  Final    <10,000 COLONIES/mL INSIGNIFICANT GROWTH Performed at [place] [address], [place], [place] [address]    Report Status 02/26/2021 FINAL  Final         Radiology Studies: No results found.      Scheduled Meds:  (feeding supplement) PROSource Plus  30 mL Oral BID BM   amLODipine  5 mg Oral Daily   carvedilol  6.25 mg Oral BID WC   Chlorhexidine Gluconate Cloth  6 each Topical Daily   feeding supplement  1 Container Oral TID BM   insulin aspart  0-15 Units Subcutaneous TID WC   insulin aspart  0-5 Units Subcutaneous QHS   multivitamin with minerals  1 tablet Oral Daily   pantoprazole  40 mg Oral Daily   phytonadione  10 mg Oral Daily   Continuous Infusions:   LOS: 7 days     Cordelia Poche, MD [facility] 02/27/2021, 6:05 PM  If 7PM-7AM, please contact night-coverage [email or web address]

## 2021-02-27 NOTE — Progress Notes (Addendum)
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 -Bladder neck, urethra, cervical biopsies 12/17/2020 -MSS, tumor mutation burden 4, K-ras amplification, PD-L1 tumor proportion score-0 -[facility] -90% intestinal malignancy (colorectal adenocarcinoma 85%) small intestine adenocarcinoma less than 5%, gastroesophageal adenocarcinoma not excluded -12/05/2020 MRI of the pelvis-poorly marginated enhancing 3.7 x 2.8 x 3.0 cm mass centered at the urethra, solid avidly enhancing bilateral ovarian masses, poorly marginated enhancing 2.4 x 2.3 x 3.0 cm mass in the left uterine cervix, mild to moderate bilateral common iliac, bilateral external iliac, and bilateral inguinal lymphadenopathy, diffuse patchy confluent nodular replacement of the pelvic osseous structures. -12/13/2020 CEA 1864, CA125 29.2 -12/16/2020 CT abdomen/pelvis-rapidly increasing size of the left ovary with some adjacent ascites, enlarging masses elsewhere, masslike area of the cervix and potentially within a urethral diverticulum, well-circumscribed left adrenal lesion measuring 3.2 x 2.6 cm, heterogeneous pattern of subtle sclerosis and lucency in the spine. -12/16/2020 MRI of the lumbar spine-diffusely abnormal appearance of the bone marrow throughout the visualized lumbar spine and pelvis highly suspicious for diffuse osseous metastatic disease, retroperitoneal/iliac adenopathy with left larger than right adnexal masses. -12/17/2020 CT chest-no acute intrathoracic pathology -Upper endoscopy 12/22/2020-gastritis, gastric nodule biopsy-adenocarcinoma, poorly differentiated with signet ring morphology -Colonoscopy 12/22/2020-polyps removed from the ascending and sigmoid colon, extrinsic compression of the sigmoid colon-tubulovillous adenoma without high-grade dysplasia, tubular adenoma and hyperplastic polyps -12/27/2020 cycle #1 FOLFOX -01/10/2021  cycle #2 FOLFOX -01/27/2021 cycle 3 FOLFOX, oxaliplatin dose reduced -02/20/2021 CT abdomen/pelvis without contrast-increased size of the bilateral ovarian masses, interval diffuse bony metastatic disease, progressive metastatic bilateral pelvic and inguinal lymphadenopathy, increased size of the left adrenal metastasis, interval nodular densities at the lung bases suspicious for metastases, interval mild right hydronephrosis and hydroureter -02/22/2021 CEA 440 2.  Abdominal pain secondary #1 3.  Constipation 4.  Normocytic anemia 5.  Leukocytosis 6.  Protein calorie malnutrition 7.  Obstructive sleep apnea 8.  Diabetes mellitus 9.  Hypertension 10.  Possible pulmonary hypertension noted on CT chest 11.  Hospital admission 02/20/2021-intractable nausea and vomiting 12.  Right hydronephrosis and hydroureter 13.  Thrombocytopenia 14.  Low-grade fever  Ms. Brett appears stable.  She no longer has nausea or vomiting.  She is taking pain medication infrequently.    Platelet count today is stable.  PTT is now normal and INR is mildly elevated, but overall improved.  She remains on oral vitamin K.  Her hemoglobin is lower today at 7.2.  We will give 1 unit PRBCs.  She has an intermittent low-grade fever, potentially related to "tumor "fever.  The urine culture returned negative.  Ms. Ethington understands she will not be a candidate for further chemotherapy unless her performance status improves and the platelet count recovers.  Recommendations: 1.  Recommend increased ambulation 2.  Transfuse 1 unit PRBCs today 3.  Outpatient follow-up will be scheduled at the cancer center.  Message has been sent to arrange for outpatient follow-up appointment next week  Please call medical oncology for questions.  [name], [name], [name], [name]  Ms. Manansala was interviewed and examined.  She appears stable.  The platelet count has recovered partially over the past few days.  She continues to have a  low-grade fever.  The fever may be related to "tumor fever ".  Urine culture returned negative.  I agree with the plan for a Red cell transfusion today. Outpatient follow-up will be scheduled at the Cancer center.  I will check on her 03/03/2021 if  she remains in the hospital.  I was present greater than 50% of today's visit.  I performed medical decision making.  SUBJECTIVE: She denies nausea, pain, and bleeding.  Pain controlled.    Oncology History  Gastric cancer ([place])  12/27/2020 Initial Diagnosis   Gastric cancer ([place])   12/27/2020 -  Chemotherapy   Patient is on Treatment Plan : GASTRIC FOLFOX q14d x 12 cycles      PHYSICAL EXAMINATION:  Vitals:   02/27/21 0035 02/27/21 0604  BP:  125/74  Pulse:  (!) 101  Resp:  17  Temp: 98.1 F (36.7 C) 98.5 F (36.9 C)  SpO2:  100%   Filed Weights   02/25/21 0600 02/26/21 0300 02/27/21 0500  Weight: 111.4 kg 112.1 kg 111.4 kg    Intake/Output from previous day: 01/18 0701 - 01/19 0700 In: 240 [P.O.:240] Out: 500 [Urine:500] HEENT: No thrush or bleeding Abdomen: Soft, tender in the left lower abdomen Vascular: No leg edema Skin: Petechiae at the legs bilaterally  Port-A-Cath site without erythema  LABORATORY DATA:  I have reviewed the data as listed CMP Latest Ref Rng & Units 02/27/2021 02/25/2021 02/22/2021  Glucose 70 - 99 mg/dL 117(H) 151(H) -  BUN 6 - 20 mg/dL 20 21(H) -  Creatinine 0.44 - 1.00 mg/dL 0.85 0.99 -  Sodium 135 - 145 mmol/L 134(L) 131(L) -  Potassium 3.5 - 5.1 mmol/L 3.3(L) 3.2(L) -  Chloride 98 - 111 mmol/L 104 102 -  CO2 22 - 32 mmol/L 22 23 -  Calcium 8.9 - 10.3 mg/dL 8.4(L) 8.2(L) -  Total Protein 6.5 - 8.1 g/dL - - 5.8(L)  Total Bilirubin 0.3 - 1.2 mg/dL - - 1.5(H)  Alkaline Phos 38 - 126 U/L - - 227(H)  AST 15 - 41 U/L - - 19  ALT 0 - 44 U/L - - 6    Lab Results  Component Value Date   WBC 15.8 (H) 02/27/2021   HGB 7.2 (L) 02/27/2021   HCT 22.3 (L) 02/27/2021   MCV 93.3 02/27/2021   PLT  48 (L) 02/27/2021   PLT 49 (L) 02/27/2021   NEUTROABS 13.5 (H) 02/27/2021    Lab Results  Component Value Date   CEA1 440.0 (H) 02/22/2021   CEA 617.97 (H) 02/11/2021    CT Abdomen Pelvis Wo Contrast  Result Date: 02/20/2021 CLINICAL DATA:  Acute, non localized abdominal pain. Nausea, vomiting, diarrhea and fatigue for the past week. Receiving chemotherapy for gastric and ovarian cancer. EXAM: CT ABDOMEN AND PELVIS WITHOUT CONTRAST TECHNIQUE: Multidetector CT imaging of the abdomen and pelvis was performed following the standard protocol without IV contrast. RADIATION DOSE REDUCTION: This exam was performed according to the departmental dose-optimization program which includes automated exposure control, adjustment of the mA and/or kV according to patient size and/or use of iterative reconstruction technique. COMPARISON:  Chest CT dated 12/17/2020. Abdomen and pelvis CT dated 12/16/2020. FINDINGS: Lower chest: Mildly enlarged heart. Small amount of residual atelectasis/scarring in the right lower lobe, including a somewhat oval wedge-shaped area at the posterior right lung base which is significantly smaller. Interval visualization of a 5 mm nodular density at the right lateral costophrenic angle on image number 20/6. There is a similar area more anteriorly on image number 22/6. There is also an interval 11 x 8 mm sub solid nodular density in the right middle lobe on image number 9/6. There is also an interval nodular density in the left lower lobe with eccentric cavitation measuring 11 x 7 mm  on image number 8/6. Hepatobiliary: No focal liver abnormality is seen. No gallstones, gallbladder wall thickening, or biliary dilatation. Pancreas: Unremarkable. No pancreatic ductal dilatation or surrounding inflammatory changes. Spleen: Normal in size without focal abnormality. Adrenals/Urinary Tract: An exophytic posterior left adrenal mass is again demonstrated. This measures 3.2 x 2.8 cm on image number  19/2, previously 3.2 x 2.6 cm. Stable normal appearing right adrenal gland. Interval mild dilatation of the right renal collecting system and ureter to the level of the ureterovesical junction with no obstructing stone or mass visualized. Stable normal appearing left kidney and ureter. The urinary bladder is poorly distended with mild to moderate diffuse wall thickening. Stomach/Bowel: A small hiatal hernia is unchanged. Unremarkable small bowel and colon. Normal appearing appendix. Vascular/Lymphatic: Bilateral enlarged pelvic and inguinal lymph nodes with progression. The largest is a left pelvic sidewall node with a short axis diameter of 17 mm on image number 75/2, previously 14 mm. Reproductive: A large left adnexal mass is again demonstrated with an interval increase in size. This measures 13.0 x 8.2 cm on image number 64/2, previously 10.8 x 8.8 cm. A right adnexal mass is again demonstrated, currently measuring 3.4 x 2.7 cm on image number 67/2, previously 2.9 cm in maximum diameter. Normal appearing uterus. Other: Small umbilical and infraumbilical hernias containing fat. Small left inguinal hernia containing fat. Musculoskeletal: Interval diffuse heterogeneous bone sclerosis. IMPRESSION: 1. Increased size of bilateral ovarian masses, especially on the left. 2. Interval diffuse bony metastatic disease. 3. Progressive metastatic bilateral pelvic and inguinal lymphadenopathy. 4. Increased size of a left adrenal metastasis. 5. Interval nodular densities at the lung bases suspicious for metastases. 6. Interval mild right hydronephrosis and hydroureter to the level of the ureterovesical junction. This could be due to recent stone passage, obstruction by wall thickening of the bladder or a nonvisualized obstructing ureteral metastasis. Electronically Signed   By: Claudie Revering M.D.   On: 02/20/2021 18:28   CT HEAD WO CONTRAST (5MM)  Result Date: 02/22/2021 CLINICAL DATA:  Encephalopathy EXAM: CT HEAD WITHOUT  CONTRAST TECHNIQUE: Contiguous axial images were obtained from the base of the skull through the vertex without intravenous contrast. RADIATION DOSE REDUCTION: This exam was performed according to the departmental dose-optimization program which includes automated exposure control, adjustment of the mA and/or kV according to patient size and/or use of iterative reconstruction technique. COMPARISON:  None. FINDINGS: Brain: There is no mass, hemorrhage or extra-axial collection. The size and configuration of the ventricles and extra-axial CSF spaces are normal. The brain parenchyma is normal, without acute or chronic infarction. Vascular: No abnormal hyperdensity of the major intracranial arteries or dural venous sinuses. No intracranial atherosclerosis. Skull: The visualized skull base, calvarium and extracranial soft tissues are normal. Sinuses/Orbits: No fluid levels or advanced mucosal thickening of the visualized paranasal sinuses. No mastoid or middle ear effusion. The orbits are normal. IMPRESSION: Normal head CT. Electronically Signed   By: Ulyses Jarred M.D.   On: 02/22/2021 22:14   DG CHEST PORT 1 VIEW  Result Date: 02/20/2021 CLINICAL DATA:  History of gastric and ovarian cancer. Fatigue, nausea, vomiting, diarrhea EXAM: PORTABLE CHEST 1 VIEW COMPARISON:  12/31/2020 FINDINGS: Right Port-A-Cath in place with the tip at the cavoatrial junction. Heart is borderline in size. Mild vascular congestion. No confluent airspace opacities, effusions or overt edema. No acute bony abnormality. IMPRESSION: Borderline heart size.  Mild vascular congestion. Electronically Signed   By: Rolm Baptise M.D.   On: 02/20/2021 21:30  Future Appointments  Date Time Provider [place]  03/06/2021  1:00 PM [facility] PHLEBOTOMIST [facility] None  03/06/2021  1:15 PM [facility] FLUSH ROOM [facility] None  03/06/2021  1:45 PM Owens Shark, NP [facility] None       LOS: 7 days

## 2021-02-27 NOTE — Progress Notes (Signed)
Pt due for HPN change today.  Pt states was not aware we change HPN q7 days and needs time before I deaccess and reaccess HPN.  Advised we will come back later this afternoon.  Pt agreeable.

## 2021-02-27 NOTE — Progress Notes (Signed)
IVT visit to change HPN.  Blood is currently infusing.  RN advised we will come back later today.

## 2021-02-27 NOTE — Plan of Care (Signed)
°  Problem: Clinical Measurements: Goal: Ability to maintain clinical measurements within normal limits will improve Outcome: Progressing Goal: Will remain free from infection Outcome: Progressing Goal: Diagnostic test results will improve Outcome: Progressing   Problem: Pain Managment: Goal: General experience of comfort will improve Outcome: Progressing

## 2021-02-27 NOTE — Discharge Planning (Signed)
Oncology Discharge Planning Note  [facility] [place] Address: [address] [place], [place], [place] [address] Hours of Operation:  Nena Polio, Monday - Friday  Clinic Contact Information:  [phone]) [phone]  Oncology Care Team: Medical Oncologist:  Dr. Betsy Coder  Patient Details: Name:  Erin [name], Erin [name] MRN:   [id number] DOB:   [birth year] Reason for Current Admission: Severe anemia  Discharge Planning Narrative: Notification of admission received by [facility] for Alveta Heimlich.  Discharge follow-up appointments for oncology are current and available on the AVS and MyChart.   Upon discharge from the hospital, hematology/oncology's post discharge plan of care for the outpatient setting is: Return to office 03/13/21 to evaluate blood counts and performance status prior to resuming treatment.   Chassity Ludke will be called within two business days after discharge to review hematology/oncology's plan of care for full understanding.    Outpatient Oncology Specific Care Only: Oncology appointment transportation needs addressed?:  SNF should transport Oncology medication management for symptom management addressed?:  no Chemo Alert Card reviewed?:  No plans for chemo at this time Immunotherapy Alert Card reviewed?:  not applicable

## 2021-02-27 NOTE — Progress Notes (Signed)
Spoke to [employer] to advise that Dr Benay Spice will be stopping by in the morning to review the slide from DIC panel.

## 2021-02-27 NOTE — TOC Progression Note (Signed)
Transition of Care [facility]) - Progression Note    Patient Details  Name: Erin [name] MRN: [id number] Date of Birth: March 25, 1965  [facility] CM/SW Contact  Leeroy Cha, RN Phone Number: 02/27/2021, 12:21 PM  Clinical Narrative:    Fl2 sent out to area snfs for consideration   Expected Discharge Plan: Home/Self Care Barriers to Discharge: No Barriers Identified  Expected Discharge Plan and Services Expected Discharge Plan: Home/Self Care       Living arrangements for the past 2 months: Single Family Home                                       Social Determinants of Health (SDOH) Interventions    Readmission Risk Interventions Readmission Risk Prevention Plan 01/06/2021  Transportation Screening Complete  Medication Review ([place]) Complete  PCP or Specialist appointment within 3-5 days of discharge Complete  HRI or Home Care Consult Complete  SW Recovery Care/Counseling Consult Complete  Palliative Care Screening Not [place] Complete  Some recent data might be hidden

## 2021-02-28 DIAGNOSIS — G9341 Metabolic encephalopathy: Secondary | ICD-10-CM | POA: Insufficient documentation

## 2021-02-28 DIAGNOSIS — D72829 Elevated white blood cell count, unspecified: Secondary | ICD-10-CM

## 2021-02-28 DIAGNOSIS — G893 Neoplasm related pain (acute) (chronic): Secondary | ICD-10-CM | POA: Diagnosis not present

## 2021-02-28 DIAGNOSIS — R400 Somnolence: Secondary | ICD-10-CM

## 2021-02-28 DIAGNOSIS — R509 Fever, unspecified: Secondary | ICD-10-CM | POA: Diagnosis not present

## 2021-02-28 DIAGNOSIS — I10 Essential (primary) hypertension: Secondary | ICD-10-CM | POA: Diagnosis not present

## 2021-02-28 LAB — CBC
HCT: 26.4 % — ABNORMAL LOW (ref 36.0–46.0)
Hemoglobin: 8.6 g/dL — ABNORMAL LOW (ref 12.0–15.0)
MCH: 29.9 pg (ref 26.0–34.0)
MCHC: 32.6 g/dL (ref 30.0–36.0)
MCV: 91.7 fL (ref 80.0–100.0)
Platelets: 57 10*3/uL — ABNORMAL LOW (ref 150–400)
RBC: 2.88 MIL/uL — ABNORMAL LOW (ref 3.87–5.11)
RDW: 17.7 % — ABNORMAL HIGH (ref 11.5–15.5)
WBC: 20.4 10*3/uL — ABNORMAL HIGH (ref 4.0–10.5)
nRBC: 0.2 % (ref 0.0–0.2)

## 2021-02-28 LAB — BASIC METABOLIC PANEL
Anion gap: 9 (ref 5–15)
BUN: 26 mg/dL — ABNORMAL HIGH (ref 6–20)
CO2: 20 mmol/L — ABNORMAL LOW (ref 22–32)
Calcium: 8.7 mg/dL — ABNORMAL LOW (ref 8.9–10.3)
Chloride: 104 mmol/L (ref 98–111)
Creatinine, Ser: 1.08 mg/dL — ABNORMAL HIGH (ref 0.44–1.00)
GFR, Estimated: >60 mL/min (ref >60)
Glucose, Bld: 126 mg/dL — ABNORMAL HIGH (ref 70–99)
Potassium: 4.2 mmol/L (ref 3.5–5.1)
Sodium: 133 mmol/L — ABNORMAL LOW (ref 135–145)

## 2021-02-28 LAB — GLUCOSE, CAPILLARY
Glucose-Capillary: 118 mg/dL — ABNORMAL HIGH (ref 70–99)
Glucose-Capillary: 128 mg/dL — ABNORMAL HIGH (ref 70–99)
Glucose-Capillary: 121 mg/dL — ABNORMAL HIGH (ref 70–99)
Glucose-Capillary: 112 mg/dL — ABNORMAL HIGH (ref 70–99)

## 2021-02-28 MED ORDER — OXYCODONE HCL 5 MG PO TABS
5.0000 mg | ORAL_TABLET | ORAL | Status: DC | PRN
  Administered 2021-02-28 – 2021-03-09 (×34): 5 mg via ORAL
  Filled 2021-02-28 (×36): qty 1

## 2021-02-28 NOTE — Progress Notes (Addendum)
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 -Bladder neck, urethra, cervical biopsies 12/17/2020 -MSS, tumor mutation burden 4, K-ras amplification, PD-L1 tumor proportion score-0 -[facility] -90% intestinal malignancy (colorectal adenocarcinoma 85%) small intestine adenocarcinoma less than 5%, gastroesophageal adenocarcinoma not excluded -12/05/2020 MRI of the pelvis-poorly marginated enhancing 3.7 x 2.8 x 3.0 cm mass centered at the urethra, solid avidly enhancing bilateral ovarian masses, poorly marginated enhancing 2.4 x 2.3 x 3.0 cm mass in the left uterine cervix, mild to moderate bilateral common iliac, bilateral external iliac, and bilateral inguinal lymphadenopathy, diffuse patchy confluent nodular replacement of the pelvic osseous structures. -12/13/2020 CEA 1864, CA125 29.2 -12/16/2020 CT abdomen/pelvis-rapidly increasing size of the left ovary with some adjacent ascites, enlarging masses elsewhere, masslike area of the cervix and potentially within a urethral diverticulum, well-circumscribed left adrenal lesion measuring 3.2 x 2.6 cm, heterogeneous pattern of subtle sclerosis and lucency in the spine. -12/16/2020 MRI of the lumbar spine-diffusely abnormal appearance of the bone marrow throughout the visualized lumbar spine and pelvis highly suspicious for diffuse osseous metastatic disease, retroperitoneal/iliac adenopathy with left larger than right adnexal masses. -12/17/2020 CT chest-no acute intrathoracic pathology -Upper endoscopy 12/22/2020-gastritis, gastric nodule biopsy-adenocarcinoma, poorly differentiated with signet ring morphology -Colonoscopy 12/22/2020-polyps removed from the ascending and sigmoid colon, extrinsic compression of the sigmoid colon-tubulovillous adenoma without high-grade dysplasia, tubular adenoma and hyperplastic polyps -12/27/2020 cycle #1 FOLFOX -01/10/2021  cycle #2 FOLFOX -01/27/2021 cycle 3 FOLFOX, oxaliplatin dose reduced -02/20/2021 CT abdomen/pelvis without contrast-increased size of the bilateral ovarian masses, interval diffuse bony metastatic disease, progressive metastatic bilateral pelvic and inguinal lymphadenopathy, increased size of the left adrenal metastasis, interval nodular densities at the lung bases suspicious for metastases, interval mild right hydronephrosis and hydroureter -02/22/2021 CEA 440 2.  Abdominal pain secondary #1 3.  Constipation 4.  Normocytic anemia 5.  Leukocytosis 6.  Protein calorie malnutrition 7.  Obstructive sleep apnea 8.  Diabetes mellitus 9.  Hypertension 10.  Possible pulmonary hypertension noted on CT chest 11.  Hospital admission 02/20/2021-intractable nausea and vomiting 12.  Right hydronephrosis and hydroureter 13.  Thrombocytopenia 14.  Low-grade fever  Erin [name] has some increased confusion this morning.  Received oxycodone overnight which is likely contributing.  She had a recent CT of the head on 02/22/2021 which was normal.  She has no hypercalcemia.  Recommend decreasing oxycodone to 5 mg every 4 hours as needed.  Continue to monitor her cognitive status and consider neurology evaluation if no improvement with decrease in pain medication.  Platelets improved to 57,000 this morning and hemoglobin up to 8.6 following 1 unit PRBCs on 02/27/2021.  Continue to monitor CBC.  She has an intermittent low-grade fever, potentially related to "tumor "fever.  The urine culture returned negative.  Erin [name] understands that she is not a candidate for further chemotherapy unless her performance status and nutritional status improve and if her platelet count recovers.  Recommendations: 1.  Decrease oxycodone to 5 mg every 4 hours as needed. 2.  Monitor CBC 3.  Awaiting SNF placement.  If her performance status, nutritional status, and platelet count did not improve, she will not be a candidate for  additional chemotherapy and we would recommend hospice. 4.  Please call oncology as needed over the weekend  Please call medical oncology for questions.  [name], [name], [name], [name]  Erin [name] was interviewed and examined.  She was confused when I saw her at approximately 7:15 AM.  She had  received oxycodone a few hours earlier.  This likely contributed to the altered mental status. The platelet count is further improved.  Erin [name] continues to have a poor performance status.  She will not be a candidate for chemotherapy unless her performance status improves.  She has an intermittent low-grade fever.  No source for infection has been identified.  I will check on her 03/03/2021.  I was present for greater than 50% of today's visit.  I performed medical decision making.  SUBJECTIVE: More confused overall this morning.  Received oxycodone 10 mg around 3 AM.  Oncology History  Gastric cancer ([place])  12/27/2020 Initial Diagnosis   Gastric cancer ([place])   12/27/2020 -  Chemotherapy   Patient is on Treatment Plan : GASTRIC FOLFOX q14d x 12 cycles      PHYSICAL EXAMINATION:  Vitals:   02/27/21 2211 02/28/21 0418  BP:  106/71  Pulse:  (!) 110  Resp:  18  Temp:  99.8 F (37.7 C)  SpO2: 96% 97%   Filed Weights   02/26/21 0300 02/27/21 0500 02/28/21 0418  Weight: 112.1 kg 111.4 kg 111.5 kg    Intake/Output from previous day: 01/19 0701 - 01/20 0700 In: 632 [P.O.:120; I.V.:250; Blood:262] Out: 575 [Urine:575] HEENT: No thrush or bleeding Abdomen: Soft, tender in the left lower abdomen Vascular: No leg edema Skin: No petechiae  Port-A-Cath site without erythema  LABORATORY DATA:  I have reviewed the data as listed CMP Latest Ref Rng & Units 02/28/2021 02/27/2021 02/25/2021  Glucose 70 - 99 mg/dL 126(H) 117(H) 151(H)  BUN 6 - 20 mg/dL 26(H) 20 21(H)  Creatinine 0.44 - 1.00 mg/dL 1.08(H) 0.85 0.99  Sodium 135 - 145 mmol/L 133(L) 134(L) 131(L)  Potassium 3.5 -  5.1 mmol/L 4.2 3.3(L) 3.2(L)  Chloride 98 - 111 mmol/L 104 104 102  CO2 22 - 32 mmol/L 20(L) 22 23  Calcium 8.9 - 10.3 mg/dL 8.7(L) 8.4(L) 8.2(L)  Total Protein 6.5 - 8.1 g/dL - - -  Total Bilirubin 0.3 - 1.2 mg/dL - - -  Alkaline Phos 38 - 126 U/L - - -  AST 15 - 41 U/L - - -  ALT 0 - 44 U/L - - -    Lab Results  Component Value Date   WBC 20.4 (H) 02/28/2021   HGB 8.6 (L) 02/28/2021   HCT 26.4 (L) 02/28/2021   MCV 91.7 02/28/2021   PLT 57 (L) 02/28/2021   NEUTROABS 13.5 (H) 02/27/2021    Lab Results  Component Value Date   CEA1 440.0 (H) 02/22/2021   CEA 617.97 (H) 02/11/2021    CT Abdomen Pelvis Wo Contrast  Result Date: 02/20/2021 CLINICAL DATA:  Acute, non localized abdominal pain. Nausea, vomiting, diarrhea and fatigue for the past week. Receiving chemotherapy for gastric and ovarian cancer. EXAM: CT ABDOMEN AND PELVIS WITHOUT CONTRAST TECHNIQUE: Multidetector CT imaging of the abdomen and pelvis was performed following the standard protocol without IV contrast. RADIATION DOSE REDUCTION: This exam was performed according to the departmental dose-optimization program which includes automated exposure control, adjustment of the mA and/or kV according to patient size and/or use of iterative reconstruction technique. COMPARISON:  Chest CT dated 12/17/2020. Abdomen and pelvis CT dated 12/16/2020. FINDINGS: Lower chest: Mildly enlarged heart. Small amount of residual atelectasis/scarring in the right lower lobe, including a somewhat oval wedge-shaped area at the posterior right lung base which is significantly smaller. Interval visualization of a 5 mm nodular density at the right lateral costophrenic angle on image  number 20/6. There is a similar area more anteriorly on image number 22/6. There is also an interval 11 x 8 mm sub solid nodular density in the right middle lobe on image number 9/6. There is also an interval nodular density in the left lower lobe with eccentric cavitation  measuring 11 x 7 mm on image number 8/6. Hepatobiliary: No focal liver abnormality is seen. No gallstones, gallbladder wall thickening, or biliary dilatation. Pancreas: Unremarkable. No pancreatic ductal dilatation or surrounding inflammatory changes. Spleen: Normal in size without focal abnormality. Adrenals/Urinary Tract: An exophytic posterior left adrenal mass is again demonstrated. This measures 3.2 x 2.8 cm on image number 19/2, previously 3.2 x 2.6 cm. Stable normal appearing right adrenal gland. Interval mild dilatation of the right renal collecting system and ureter to the level of the ureterovesical junction with no obstructing stone or mass visualized. Stable normal appearing left kidney and ureter. The urinary bladder is poorly distended with mild to moderate diffuse wall thickening. Stomach/Bowel: A small hiatal hernia is unchanged. Unremarkable small bowel and colon. Normal appearing appendix. Vascular/Lymphatic: Bilateral enlarged pelvic and inguinal lymph nodes with progression. The largest is a left pelvic sidewall node with a short axis diameter of 17 mm on image number 75/2, previously 14 mm. Reproductive: A large left adnexal mass is again demonstrated with an interval increase in size. This measures 13.0 x 8.2 cm on image number 64/2, previously 10.8 x 8.8 cm. A right adnexal mass is again demonstrated, currently measuring 3.4 x 2.7 cm on image number 67/2, previously 2.9 cm in maximum diameter. Normal appearing uterus. Other: Small umbilical and infraumbilical hernias containing fat. Small left inguinal hernia containing fat. Musculoskeletal: Interval diffuse heterogeneous bone sclerosis. IMPRESSION: 1. Increased size of bilateral ovarian masses, especially on the left. 2. Interval diffuse bony metastatic disease. 3. Progressive metastatic bilateral pelvic and inguinal lymphadenopathy. 4. Increased size of a left adrenal metastasis. 5. Interval nodular densities at the lung bases suspicious for  metastases. 6. Interval mild right hydronephrosis and hydroureter to the level of the ureterovesical junction. This could be due to recent stone passage, obstruction by wall thickening of the bladder or a nonvisualized obstructing ureteral metastasis. Electronically Signed   By: Claudie Revering M.D.   On: 02/20/2021 18:28   CT HEAD WO CONTRAST (5MM)  Result Date: 02/22/2021 CLINICAL DATA:  Encephalopathy EXAM: CT HEAD WITHOUT CONTRAST TECHNIQUE: Contiguous axial images were obtained from the base of the skull through the vertex without intravenous contrast. RADIATION DOSE REDUCTION: This exam was performed according to the departmental dose-optimization program which includes automated exposure control, adjustment of the mA and/or kV according to patient size and/or use of iterative reconstruction technique. COMPARISON:  None. FINDINGS: Brain: There is no mass, hemorrhage or extra-axial collection. The size and configuration of the ventricles and extra-axial CSF spaces are normal. The brain parenchyma is normal, without acute or chronic infarction. Vascular: No abnormal hyperdensity of the major intracranial arteries or dural venous sinuses. No intracranial atherosclerosis. Skull: The visualized skull base, calvarium and extracranial soft tissues are normal. Sinuses/Orbits: No fluid levels or advanced mucosal thickening of the visualized paranasal sinuses. No mastoid or middle ear effusion. The orbits are normal. IMPRESSION: Normal head CT. Electronically Signed   By: Ulyses Jarred M.D.   On: 02/22/2021 22:14   DG CHEST PORT 1 VIEW  Result Date: 02/20/2021 CLINICAL DATA:  History of gastric and ovarian cancer. Fatigue, nausea, vomiting, diarrhea EXAM: PORTABLE CHEST 1 VIEW COMPARISON:  12/31/2020 FINDINGS: Right Port-A-Cath  in place with the tip at the cavoatrial junction. Heart is borderline in size. Mild vascular congestion. No confluent airspace opacities, effusions or overt edema. No acute bony abnormality.  IMPRESSION: Borderline heart size.  Mild vascular congestion. Electronically Signed   By: Rolm Baptise M.D.   On: 02/20/2021 21:30     Future Appointments  Date Time Provider [place]  03/06/2021  1:00 PM [facility] PHLEBOTOMIST [facility] None  03/06/2021  1:15 PM [facility] FLUSH ROOM [facility] None  03/06/2021  1:45 PM Owens Shark, NP [facility] None       LOS: 8 days

## 2021-02-28 NOTE — Assessment & Plan Note (Addendum)
Oxycodone reduced. Possibly some effect from underlying bacteremia. Somnolence is improved. Encephalopathy improved - follow encephalopathy with pain meds - was ok today, seemed improved MRI 1/23 with motion artifact, no acute intracranial process EEG with moderate diffuse encephalopathy, no seizures or epileptiform discharges

## 2021-02-28 NOTE — Assessment & Plan Note (Addendum)
Likely secondary to bacteremia/infection.  WBC still elevated, seems to be gradually improving, relatively stable today

## 2021-02-28 NOTE — Progress Notes (Signed)
PROGRESS NOTE    Erin [name]  WYO:[id number] DOB: 09/29/1965 DOA: 02/20/2021 PCP: Merryl Hacker, No   Brief Narrative: Erin [name] is a 56 y.o. 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 Patient presented secondary to nausea/vomiting/diarrhea and abdominal pain. Hospitalization complicated by recurrent anemia. Fecal occult blood test negative. Patient has received 4 units of PRBC to date. Platelets improving.   Assessment & Plan:   * Severe anemia- (present on admission) Secondary to chemotherapy. Hemoglobin of 6.0 on admission. Patient has received 4 units of PRBC to date. Hemoglobin up to 8 with recurrent downtrend requiring another blood transfusion. Rebound hemoglobin of 8.6.  Somnolence Likely related to oxycodone overnight. She received 10 mg. Oxycodone now reduced to 5 mg dose.  Leukocytosis Unsure of etiology. Previously with fever but none overnight. Still no source of infection noted.  -CBC in AM  Thrombocytopenia (HCC) Related to chemotherapy. Improving.  Protein-calorie malnutrition, severe Dietitian recommendations: Continue Boost Breeze TID and Prosource Plus BID. Continue to provide encouragement with eating and drinking as able.   Fever- (present on admission) Possibly related tumor fever. No obvious source for active infection.  Cancer related pain- (present on admission) -Continue Oxycodone prn  Metastatic adenocarcinoma of unknown origin [facility])- (present on admission) Patient follows with Dr. Benay Spice and is receiving chemotherapy. Recent imaging significant for progression of disease.  Hypokalemia- (present on admission) -Potassium supplementation as needed  Diabetes mellitus (HCC) Hemoglobin A1C of 7.1 -Continue SSI  Essential hypertension- (present on admission) Patient is on hydrochlorothiazide, Coreg and  amlodipine as an outpatient. Coreg and amlodipine continued. Blood pressure well controlled. -Continue amlodipine and Coreg     DVT prophylaxis: SCDs Code Status:   Code Status: Full Code Family Communication: None at bedside Disposition Plan: Discharge to SNF likely in 1-2 days pending stable hemoglobin/WBC   Consultants:  Medical oncology Palliative care medicine  Procedures:  None  Antimicrobials: Fever    Subjective: Patient reports no issues overnight. She does not think the oxycodone is too strong.  Objective: Vitals:   02/27/21 2049 02/27/21 2206 02/27/21 2211 02/28/21 0418  BP: 118/69   106/71  Pulse: (!) 102 (!) 109  (!) 110  Resp: 19 20  18   Temp: 98.5 F (36.9 C)   99.8 F (37.7 C)  TempSrc: Oral   Oral  SpO2: 95% 90% 96% 97%  Weight:    111.5 kg  Height:        Intake/Output Summary (Last 24 hours) at 02/28/2021 1122 Last data filed at 02/28/2021 0316 Gross per 24 hour  Intake 512 ml  Output 575 ml  Net -63 ml    Filed Weights   02/26/21 0300 02/27/21 0500 02/28/21 0418  Weight: 112.1 kg 111.4 kg 111.5 kg    Examination:  General exam: Appears calm and comfortable Respiratory system: Clear to auscultation. Respiratory effort normal. Cardiovascular system: S1 & S2 heard, RRR. Gastrointestinal system: Abdomen is nondistended, soft and nontender. No organomegaly or masses felt. Normal bowel sounds heard. Central nervous system: Somnolent and oriented to person and place. No focal neurological deficits. Musculoskeletal: No calf tenderness Skin: No cyanosis. No rashes Psychiatry: Mood & affect appropriate.     Data Reviewed: I have personally reviewed following labs and imaging studies  CBC Lab Results  Component Value Date   WBC 20.4 (H) 02/28/2021   RBC 2.88 (L) 02/28/2021   HGB 8.6 (L) 02/28/2021  HCT 26.4 (L) 02/28/2021   MCV 91.7 02/28/2021   MCH 29.9 02/28/2021   PLT 57 (L) 02/28/2021   MCHC 32.6 02/28/2021   RDW 17.7 (H)  02/28/2021   LYMPHSABS 1.1 02/27/2021   MONOABS 0.9 02/27/2021   EOSABS 0.0 02/27/2021   BASOSABS 0.0 66/07/3014     Last metabolic panel Lab Results  Component Value Date   NA 133 (L) 02/28/2021   K 4.2 02/28/2021   CL 104 02/28/2021   CO2 20 (L) 02/28/2021   BUN 26 (H) 02/28/2021   CREATININE 1.08 (H) 02/28/2021   GLUCOSE 126 (H) 02/28/2021   GFRNONAA >60 02/28/2021   GFRAA 78 03/08/2020   CALCIUM 8.7 (L) 02/28/2021   PHOS 2.6 02/22/2021   PROT 5.8 (L) 02/22/2021   ALBUMIN 1.9 (L) 02/22/2021   LABGLOB 3.0 03/08/2020   AGRATIO 1.3 03/08/2020   BILITOT 1.5 (H) 02/22/2021   ALKPHOS 227 (H) 02/22/2021   AST 19 02/22/2021   ALT 6 02/22/2021   ANIONGAP 9 02/28/2021    CBG (last 3)  Recent Labs    02/27/21 1635 02/27/21 2042 02/28/21 0738  GLUCAP 106* 113* 118*      GFR: Estimated Creatinine Clearance: 71.9 mL/min (A) (by C-G formula based on SCr of 1.08 mg/dL (H)).  Coagulation Profile: Recent Labs  Lab 02/24/21 1307 02/27/21 0321  INR 1.6* 1.6*     Recent Results (from the past 240 hour(s))  Resp Panel by RT-PCR (Flu A&B, Covid) Nasopharyngeal Swab     Status: None   Collection Time: 02/20/21  5:46 PM   Specimen: Nasopharyngeal Swab; Nasopharyngeal(NP) swabs in vial transport medium  Result Value Ref Range Status   SARS Coronavirus 2 by RT PCR NEGATIVE NEGATIVE Final    Comment: (NOTE) SARS-CoV-2 target nucleic acids are NOT DETECTED.  The SARS-CoV-2 RNA is generally detectable in upper respiratory specimens during the acute phase of infection. The lowest concentration of SARS-CoV-2 viral copies this assay can detect is 138 copies/mL. A negative result does not preclude SARS-Cov-2 infection and should not be used as the sole basis for treatment or other patient management decisions. A negative result may occur with  improper specimen collection/handling, submission of specimen other than nasopharyngeal swab, presence of viral mutation(s) within  the areas targeted by this assay, and inadequate number of viral copies(<138 copies/mL). A negative result must be combined with clinical observations, patient history, and epidemiological information. The expected result is Negative.  Fact Sheet for Patients:  EntrepreneurPulse.com.au  Fact Sheet for Healthcare Providers:  IncredibleEmployment.be  This test is no t yet approved or cleared by the [place] FDA and  has been authorized for detection and/or diagnosis of SARS-CoV-2 by FDA under an Emergency Use Authorization (EUA). This EUA will remain  in effect (meaning this test can be used) for the duration of the COVID-19 declaration under Section 564(b)(1) of the Act, 21 U.S.C.section 360bbb-3(b)(1), unless the authorization is terminated  or revoked sooner.       Influenza A by PCR NEGATIVE NEGATIVE Final   Influenza B by PCR NEGATIVE NEGATIVE Final    Comment: (NOTE) The Xpert Xpress SARS-CoV-2/FLU/RSV plus assay is intended as an aid in the diagnosis of influenza from Nasopharyngeal swab specimens and should not be used as a sole basis for treatment. Nasal washings and aspirates are unacceptable for Xpert Xpress SARS-CoV-2/FLU/RSV testing.  Fact Sheet for Patients: EntrepreneurPulse.com.au  Fact Sheet for Healthcare Providers: IncredibleEmployment.be  This test is not yet approved or cleared by the  [place] Architectural technologist and has been authorized for detection and/or diagnosis of SARS-CoV-2 by FDA under an Print production planner (EUA). This EUA will remain in effect (meaning this test can be used) for the duration of the COVID-19 declaration under Section 564(b)(1) of the Act, 21 U.S.C. section 360bbb-3(b)(1), unless the authorization is terminated or revoked.  Performed at [facility], [place] [address] [place], [place] [address]   Urine Culture     Status: Abnormal    Collection Time: 02/24/21  9:49 AM   Specimen: Urine, Clean Catch  Result Value Ref Range Status   Specimen Description   Final    URINE, CLEAN CATCH Performed at [facility], [place] [address], [place], [place] [address]    Special Requests   Final    NONE Performed at [facility], [place] [address] [place], [place] [address]    Culture (A)  Final    <10,000 COLONIES/mL INSIGNIFICANT GROWTH Performed at [facility] [address] [place], [place] [address]    Report Status 02/26/2021 FINAL  Final         Radiology Studies: No results found.      Scheduled Meds:  (feeding supplement) PROSource Plus  30 mL Oral BID BM   amLODipine  5 mg Oral Daily   carvedilol  6.25 mg Oral BID WC   Chlorhexidine Gluconate Cloth  6 each Topical Daily   feeding supplement  1 Container Oral TID BM   insulin aspart  0-15 Units Subcutaneous TID WC   insulin aspart  0-5 Units Subcutaneous QHS   multivitamin with minerals  1 tablet Oral Daily   pantoprazole  40 mg Oral Daily   Continuous Infusions:   LOS: 8 days     Cordelia Poche, MD [facility] 02/28/2021, 11:22 AM  If 7PM-7AM, please contact night-coverage [email or web address]

## 2021-02-28 NOTE — Progress Notes (Signed)
Chaplain received page previosu evening 02/27/21 - today Chaplain met with patient at bedside - who appeared confused about paperwork needs for her Advanced Directive.  Met family outside room and spoke at length with Notary and Witnesses.  Spoke at length with two female relatives who shared their concerns for her prognosis and handling paperwork for AD.  Designated [place]utz will be contacted to come to hospital for completion of paperwork  Provided spiritual care for two relatives and prayed with them for emotional strength and spiritual comfort.  Informed family that [facility] will remain available for them and Ms. Wilds.   Family will contact office for AD to be signed within the next two days.      02/28/21 1400  Clinical Encounter Type  Visited With Patient;Patient and family together  Visit Type Initial;Social support;Spiritual support  Referral From Nurse  Consult/Referral To Chaplain  Spiritual Encounters  Spiritual Needs Prayer;Emotional;Other (Comment) (Advanced Directive paerwork)  Stress Factors  Patient Stress Factors Health changes;Lack of knowledge;Loss of control;Major life changes  Family Stress Factors Family relationships;Health changes;Loss;Major life changes

## 2021-02-28 NOTE — Progress Notes (Signed)
Physical Therapy Treatment Patient Details Name: Erin [name] MRN: [id number] DOB: 1965/06/13 Today's Date: 02/28/2021   History of Present Illness 56 y.o. 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  CT scan of the abdomen and pelvis showed increasing pulmonary mass with new bone metastases and increased in size of left adrenal metastases as well as along nodules.  Pt admitted 02/20/21 for Severe anemia symptomatic anemia due to antineoplastic drug and Poorly differentiated metastatic adenocarcinoma of lung clear primary    PT Comments    Pt with slight improvement today.  She had a few more responses today but still required max multimodal cues and increased time to respond.  Did take a few steps, but do recommend assist of 2 due to decreased ability to follow commands/unpredictable mobility.    Recommendations for follow up therapy are one component of a multi-disciplinary discharge planning process, led by the attending physician.  Recommendations may be updated based on patient status, additional functional criteria and insurance authorization.  Follow Up Recommendations  Skilled nursing-short term rehab (<3 hours/day)     Assistance Recommended at Discharge Frequent or constant Supervision/Assistance  Patient can return home with the following A lot of help with walking and/or transfers;A lot of help with bathing/dressing/bathroom;Help with stairs or ramp for entrance   Equipment Recommendations  None recommended by PT     Recommendations for Other Services       Precautions / Restrictions Precautions Precautions: Fall     Mobility  Bed Mobility Overal bed mobility: Needs Assistance Bed Mobility: Supine to Sit, Sit to Supine     Supine to sit: Mod assist, +2 for physical assistance Sit to supine: Mod assist, +2 for physical assistance   General bed mobility comments: Significantly increased time and multimodal cues.  When therapist would initiate leg movement pt would assist but otherwise needing assist and cues.    Transfers Overall transfer level: Needs assistance Equipment used: Rolling walker (2 wheels) Transfers: Sit to/from Stand Sit to Stand: Min assist, +2 physical assistance           General transfer comment: Max multimodal cues for transfers and increased time with min A of 2 to rise    Ambulation/Gait Ambulation/Gait assistance: Min assist, +2 physical assistance Gait Distance (Feet): 5 Feet Assistive device: Rolling walker (2 wheels) Gait Pattern/deviations: Step-to pattern, Decreased stride length, Shuffle, Wide base of support Gait velocity: decr     General Gait Details: Significantly increased time for each step requiring assist to weight shift, rotate, and advance leg for small step.  Unable to coordinate a turn , so walked 5' forward then slid bed closer to pt   Stairs             Wheelchair Mobility    Modified Rankin (Stroke Patients Only)       Balance Overall balance assessment: Needs assistance Sitting-balance support: No upper extremity supported Sitting balance-Leahy Scale: Fair     Standing balance support: Bilateral upper extremity supported, Reliant on assistive device for balance Standing balance-Leahy Scale: Poor  Cognition Arousal/Alertness: Awake/alert Behavior During Therapy: Flat affect Overall Cognitive Status: Impaired/Different from baseline                                  General Comments: Pt still requiring significantly increased time for response and at times would repeat what therapist said.  Pt also still with limited responses at times. Requiring max multimodal cues for any movement        Exercises      General Comments General comments (skin integrity, edema, etc.): Pt frequently going "um" during therapy - uanble to express why (asked if dizzy, bathroom needs, pain, etc).  Pt did have BM - assisted with cleaning as able in standing but purewick was soiled and BM was anterior and posterior.  Had to return pt to bed instead of chair.  Cleaned as able in standing - notified tech needed further cleaning and new purewick.      Pertinent Vitals/Pain Pain Assessment Pain Assessment: Faces Faces Pain Scale: No hurt    Home Living                          Prior Function            PT Goals (current goals can now be found in the care plan section) Progress towards PT goals: Progressing toward goals    Frequency    Min 2X/week      PT Plan Frequency needs to be updated    Co-evaluation              AM-PAC PT "6 Clicks" Mobility   Outcome Measure  Help needed turning from your back to your side while in a flat bed without using bedrails?: A Lot Help needed moving from lying on your back to sitting on the side of a flat bed without using bedrails?: Total Help needed moving to and from a bed to a chair (including a wheelchair)?: Total Help needed standing up from a chair using your arms (e.g., wheelchair or bedside chair)?: Total Help needed to walk in hospital room?: Total Help needed climbing 3-5 steps with a railing? : Total 6 Click Score: 7    End of Session Equipment Utilized During Treatment: Gait belt Activity Tolerance: Patient tolerated treatment well Patient left: in bed;with call bell/phone within reach;with bed alarm set Nurse Communication: Mobility status PT Visit Diagnosis: Difficulty in walking, not  elsewhere classified (R26.2);Muscle weakness (generalized) (M62.81)     Time: 4239-5320 PT Time Calculation (min) (ACUTE ONLY): 19 min  Charges:  $Gait Training: 8-22 mins                     Abran Richard, PT Acute Rehab Services Pager [phone] [facility] [place] 02/28/2021, 5:05 PM

## 2021-03-01 DIAGNOSIS — I10 Essential (primary) hypertension: Secondary | ICD-10-CM | POA: Diagnosis not present

## 2021-03-01 DIAGNOSIS — D649 Anemia, unspecified: Secondary | ICD-10-CM | POA: Diagnosis not present

## 2021-03-01 DIAGNOSIS — I959 Hypotension, unspecified: Secondary | ICD-10-CM

## 2021-03-01 DIAGNOSIS — G893 Neoplasm related pain (acute) (chronic): Secondary | ICD-10-CM | POA: Diagnosis not present

## 2021-03-01 DIAGNOSIS — R509 Fever, unspecified: Secondary | ICD-10-CM | POA: Diagnosis not present

## 2021-03-01 LAB — CBC
HCT: 22.8 % — ABNORMAL LOW (ref 36.0–46.0)
Hemoglobin: 7.4 g/dL — ABNORMAL LOW (ref 12.0–15.0)
MCH: 30.1 pg (ref 26.0–34.0)
MCHC: 32.5 g/dL (ref 30.0–36.0)
MCV: 92.7 fL (ref 80.0–100.0)
Platelets: 58 10*3/uL — ABNORMAL LOW (ref 150–400)
RBC: 2.46 MIL/uL — ABNORMAL LOW (ref 3.87–5.11)
RDW: 17.6 % — ABNORMAL HIGH (ref 11.5–15.5)
WBC: 17.2 10*3/uL — ABNORMAL HIGH (ref 4.0–10.5)
nRBC: 0 % (ref 0.0–0.2)

## 2021-03-01 LAB — GLUCOSE, CAPILLARY
Glucose-Capillary: 120 mg/dL — ABNORMAL HIGH (ref 70–99)
Glucose-Capillary: 117 mg/dL — ABNORMAL HIGH (ref 70–99)
Glucose-Capillary: 103 mg/dL — ABNORMAL HIGH (ref 70–99)
Glucose-Capillary: 102 mg/dL — ABNORMAL HIGH (ref 70–99)

## 2021-03-01 LAB — PREPARE RBC (CROSSMATCH)

## 2021-03-01 MED ORDER — ORAL CARE MOUTH RINSE
15.0000 mL | Freq: Two times a day (BID) | OROMUCOSAL | Status: DC
  Administered 2021-03-01 – 2021-03-13 (×24): 15 mL via OROMUCOSAL

## 2021-03-01 MED ORDER — SODIUM CHLORIDE 0.9% IV SOLUTION: Freq: Once | INTRAVENOUS | Status: AC

## 2021-03-01 MED ORDER — ZINC OXIDE 40 % EX OINT
TOPICAL_OINTMENT | CUTANEOUS | Status: DC | PRN
  Administered 2021-03-02: 1 via TOPICAL
  Filled 2021-03-01 (×2): qty 57

## 2021-03-01 MED ORDER — SODIUM CHLORIDE 0.9 % IV BOLUS
500.0000 mL | Freq: Once | INTRAVENOUS | Status: AC
  Administered 2021-03-01: 500 mL via INTRAVENOUS

## 2021-03-01 NOTE — Progress Notes (Addendum)
PROGRESS NOTE    Erin [name]  AST:[id number] DOB: Nov 03, 1965 DOA: 02/20/2021 PCP: Merryl Hacker, No   Brief Narrative: Erin [name] is a 56 y.o. 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 Patient presented secondary to nausea/vomiting/diarrhea and abdominal pain. Hospitalization complicated by recurrent anemia. Fecal occult blood test negative. Patient has received 4 units of PRBC to date. Platelets improving.   Assessment & Plan:   * Severe anemia- (present on admission) Secondary to chemotherapy. Hemoglobin of 6.0 on admission. Patient has received 4 units of PRBC to date. Hemoglobin up to 8 with recurrent downtrend requiring another blood transfusion. Rebound hemoglobin of 8.6 but has drifted down again with associated hypotension.  Hypotension Possibly related to anemia, but patient with low grade fevers and worsened leukocytosis. No source for infection and leukocytosis/fevers could be related to underlying cancer. Recent urine culture with insignificant growth. -NS 500 mL bolus x1 -Transfuse 1 unit PRBC -Blood transfusions  Somnolence Likely related to oxycodone overnight. She received 10 mg. Oxycodone now reduced to 5 mg dose.  Leukocytosis Unsure of etiology. Continues to have intermittent low grade fevers. Still no source of infection noted. Stable. -CBC in AM  Thrombocytopenia (HCC) Related to chemotherapy. Improving.  Protein-calorie malnutrition, severe Dietitian recommendations: Continue Boost Breeze TID and Prosource Plus BID. Continue to provide encouragement with eating and drinking as able.   Fever- (present on admission) Possibly related tumor fever. No obvious source for active infection.  Cancer related pain- (present on admission) -Continue Oxycodone prn  Metastatic adenocarcinoma of unknown origin [facility])- (present on  admission) Patient follows with Dr. Benay Spice and is receiving chemotherapy. Recent imaging significant for progression of disease.  Hypokalemia- (present on admission) -Potassium supplementation as needed  Diabetes mellitus (HCC) Hemoglobin A1C of 7.1 -Continue SSI  Essential hypertension- (present on admission) Patient is on hydrochlorothiazide, Coreg and amlodipine as an outpatient. Coreg and amlodipine continued. Blood pressure mostly well controlled but with hypotension today in setting of worsened anemia -Hold amlodipine and Coreg     DVT prophylaxis: SCDs Code Status:   Code Status: Full Code Family Communication: None at bedside Disposition Plan: Discharge to SNF likely in 2 days pending stable hemoglobin/WBC and blood pressure   Consultants:  Medical oncology Palliative care medicine  Procedures:  None  Antimicrobials: Fever    Subjective: No issues. Some abdominal pain but not much different from chronic pain.  Objective: Vitals:   03/01/21 0805 03/01/21 0923 03/01/21 1023 03/01/21 1133  BP: 102/72  (!) 84/56 (!) 102/59  Pulse: (!) 107  96 91  Resp: (!) 23  (!) 22 20  Temp: 99.7 F (37.6 C) 98.3 F (36.8 C) 97.8 F (36.6 C) (!) 97.5 F (36.4 C)  TempSrc: Axillary Axillary Axillary Oral  SpO2: 94%  96% 98%  Weight:      Height:        Intake/Output Summary (Last 24 hours) at 03/01/2021 1255 Last data filed at 03/01/2021 1020 Gross per 24 hour  Intake 170 ml  Output 500 ml  Net -330 ml    Filed Weights   02/27/21 0500 02/28/21 0418 03/01/21 0500  Weight: 111.4 kg 111.5 kg 115.3 kg    Examination:  General exam: Appears calm and comfortable  Respiratory system: Clear to auscultation. Respiratory effort normal. Cardiovascular system: S1 & S2 heard, RRR. No murmurs, rubs, gallops or clicks. Gastrointestinal system: Abdomen is distended, soft and with mild-moderate  tenderness. Normal bowel sounds heard. Central nervous system: Alert and  oriented. No focal neurological deficits. Musculoskeletal: No calf tenderness Skin: No cyanosis. No rashes Psychiatry: Judgement and insight appear normal. Mood & affect appropriate.     Data Reviewed: I have personally reviewed following labs and imaging studies  CBC Lab Results  Component Value Date   WBC 17.2 (H) 03/01/2021   RBC 2.46 (L) 03/01/2021   HGB 7.4 (L) 03/01/2021   HCT 22.8 (L) 03/01/2021   MCV 92.7 03/01/2021   MCH 30.1 03/01/2021   PLT 58 (L) 03/01/2021   MCHC 32.5 03/01/2021   RDW 17.6 (H) 03/01/2021   LYMPHSABS 1.1 02/27/2021   MONOABS 0.9 02/27/2021   EOSABS 0.0 02/27/2021   BASOSABS 0.0 82/42/3536     Last metabolic panel Lab Results  Component Value Date   NA 133 (L) 02/28/2021   K 4.2 02/28/2021   CL 104 02/28/2021   CO2 20 (L) 02/28/2021   BUN 26 (H) 02/28/2021   CREATININE 1.08 (H) 02/28/2021   GLUCOSE 126 (H) 02/28/2021   GFRNONAA >60 02/28/2021   GFRAA 78 03/08/2020   CALCIUM 8.7 (L) 02/28/2021   PHOS 2.6 02/22/2021   PROT 5.8 (L) 02/22/2021   ALBUMIN 1.9 (L) 02/22/2021   LABGLOB 3.0 03/08/2020   AGRATIO 1.3 03/08/2020   BILITOT 1.5 (H) 02/22/2021   ALKPHOS 227 (H) 02/22/2021   AST 19 02/22/2021   ALT 6 02/22/2021   ANIONGAP 9 02/28/2021    CBG (last 3)  Recent Labs    02/28/21 2044 03/01/21 0720 03/01/21 1122  GLUCAP 112* 120* 117*      GFR: Estimated Creatinine Clearance: 73.3 mL/min (A) (by C-G formula based on SCr of 1.08 mg/dL (H)).  Coagulation Profile: Recent Labs  Lab 02/24/21 1307 02/27/21 0321  INR 1.6* 1.6*     Recent Results (from the past 240 hour(s))  Resp Panel by RT-PCR (Flu A&B, Covid) Nasopharyngeal Swab     Status: None   Collection Time: 02/20/21  5:46 PM   Specimen: Nasopharyngeal Swab; Nasopharyngeal(NP) swabs in vial transport medium  Result Value Ref Range Status   SARS Coronavirus 2 by RT PCR NEGATIVE NEGATIVE Final    Comment: (NOTE) SARS-CoV-2 target nucleic acids are NOT  DETECTED.  The SARS-CoV-2 RNA is generally detectable in upper respiratory specimens during the acute phase of infection. The lowest concentration of SARS-CoV-2 viral copies this assay can detect is 138 copies/mL. A negative result does not preclude SARS-Cov-2 infection and should not be used as the sole basis for treatment or other patient management decisions. A negative result may occur with  improper specimen collection/handling, submission of specimen other than nasopharyngeal swab, presence of viral mutation(s) within the areas targeted by this assay, and inadequate number of viral copies(<138 copies/mL). A negative result must be combined with clinical observations, patient history, and epidemiological information. The expected result is Negative.  Fact Sheet for Patients:  EntrepreneurPulse.com.au  Fact Sheet for Healthcare Providers:  IncredibleEmployment.be  This test is no t yet approved or cleared by the [place] FDA and  has been authorized for detection and/or diagnosis of SARS-CoV-2 by FDA under an Emergency Use Authorization (EUA). This EUA will remain  in effect (meaning this test can be used) for the duration of the COVID-19 declaration under Section 564(b)(1) of the Act, 21 U.S.C.section 360bbb-3(b)(1), unless the authorization is terminated  or revoked sooner.       Influenza A by PCR NEGATIVE NEGATIVE Final   Influenza  B by PCR NEGATIVE NEGATIVE Final    Comment: (NOTE) The Xpert Xpress SARS-CoV-2/FLU/RSV plus assay is intended as an aid in the diagnosis of influenza from Nasopharyngeal swab specimens and should not be used as a sole basis for treatment. Nasal washings and aspirates are unacceptable for Xpert Xpress SARS-CoV-2/FLU/RSV testing.  Fact Sheet for Patients: EntrepreneurPulse.com.au  Fact Sheet for Healthcare Providers: IncredibleEmployment.be  This test is not yet  approved or cleared by the [place] FDA and has been authorized for detection and/or diagnosis of SARS-CoV-2 by FDA under an Emergency Use Authorization (EUA). This EUA will remain in effect (meaning this test can be used) for the duration of the COVID-19 declaration under Section 564(b)(1) of the Act, 21 U.S.C. section 360bbb-3(b)(1), unless the authorization is terminated or revoked.  Performed at [facility], [place] [address] [place], [place] [address]   Urine Culture     Status: Abnormal   Collection Time: 02/24/21  9:49 AM   Specimen: Urine, Clean Catch  Result Value Ref Range Status   Specimen Description   Final    URINE, CLEAN CATCH Performed at [facility], [place] [address] [place], [place] [address]    Special Requests   Final    NONE Performed at [facility], [place] [address], [place], [place] [address]    Culture (A)  Final    <10,000 COLONIES/mL INSIGNIFICANT GROWTH Performed at [place] [address] [place], [place] [address]    Report Status 02/26/2021 FINAL  Final         Radiology Studies: No results found.      Scheduled Meds:  (feeding supplement) PROSource Plus  30 mL Oral BID BM   Chlorhexidine Gluconate Cloth  6 each Topical Daily   feeding supplement  1 Container Oral TID BM   insulin aspart  0-15 Units Subcutaneous TID WC   insulin aspart  0-5 Units Subcutaneous QHS   mouth rinse  15 mL Mouth Rinse BID   multivitamin with minerals  1 tablet Oral Daily   pantoprazole  40 mg Oral Daily   Continuous Infusions:   LOS: 9 days     Cordelia Poche, MD [facility] 03/01/2021, 12:55 PM  If 7PM-7AM, please contact night-coverage [email or web address]

## 2021-03-01 NOTE — Assessment & Plan Note (Addendum)
Likely related to combination of anemia, sepsis and iatrogenic from antihypertensives.  Given a 500 mL NS bolus. Resolved.

## 2021-03-01 NOTE — Progress Notes (Signed)
Patient alert but lethargic.  Poor intake throughout day: several bites of scrambled eggs with cheese for breakfast, a few spoonfuls of pudding for lunch.  Asked when she would like her dinner to arrive, responded, "Tomorrow."  Taking small sips of water with encouragement, but not often without prompting.  Refusing scheduled nutritional supplements (Boost, ProSource).  Ensure offered as alternative, but pt declined.    Patient refused sitting up in chair, but did transfer to bedside commode for bath. Tolerated well.  Angie Fava, RN

## 2021-03-01 NOTE — Progress Notes (Addendum)
°   03/01/21 0805  Assess: MEWS Score  Temp 99.7 F (37.6 C)  BP 102/72  Pulse Rate (!) 107  Resp (!) 23  Level of Consciousness Alert  SpO2 94 %  O2 Device Room Air  Assess: MEWS Score  MEWS Temp 0  MEWS Systolic 0  MEWS Pulse 1  MEWS RR 1  MEWS LOC 0  MEWS Score 2  MEWS Score Color Yellow  Assess: if the MEWS score is Yellow or Red  Were vital signs taken at a resting state? Yes  Focused Assessment Change from prior assessment (see assessment flowsheet)  Does the patient meet 2 or more of the SIRS criteria? Yes  Does the patient have a confirmed or suspected source of infection? Yes  Provider and Rapid Response Notified?  (MD notified)  MEWS guidelines implemented *See Row Information* Yes  Treat  MEWS Interventions Escalated (See documentation below)  Pain Scale 0-10  Pain Score 8  Take Vital Signs  Increase Vital Sign Frequency  Yellow: Q 2hr X 2 then Q 4hr X 2, if remains yellow, continue Q 4hrs  Escalate  MEWS: Escalate Yellow: discuss with charge nurse/RN and consider discussing with provider and RRT  Notify: Charge Nurse/RN  Name of Charge Nurse/RN Notified Brien Mates, RN  Date Charge Nurse/RN Notified 03/01/21  Time Charge Nurse/RN Notified [id number]  Notify: Provider  Provider Name/Title Cordelia Poche, MD  Date Provider Notified 03/01/21  Time Provider Notified [phone]  Notification Type Page  Notification Reason Change in status  Assess: SIRS CRITERIA  SIRS Temperature  0  SIRS Pulse 1  SIRS Respirations  1  SIRS WBC 1  SIRS Score Sum  3   Patient noted to be tachypneic at shift change, with pursed lip breathing.  RN asked patient to describe how she was feeling.  Patient alert, but response delayed.  Per patient, "I can't describe it." Pointed to LUQ abdomen.  Said yes when asked if in pain and discomfort.  Rated pain 8/10.  PRN oxycodone (5 mg) given at 0619, unable to give another dose until 1019.  MD notified, ordered dose of PRN Tylenol.  Patient alert, eyes  wide open.  Oriented to self and year, disoriented to month, day, location, situation.  Patient skin reddish, warm to touch.  MD notified about change in VS.  Escalated to Yellow MEWS.  Angie Fava, RN

## 2021-03-02 ENCOUNTER — Inpatient Hospital Stay (HOSPITAL_COMMUNITY): Payer: 59

## 2021-03-02 DIAGNOSIS — C801 Malignant (primary) neoplasm, unspecified: Secondary | ICD-10-CM

## 2021-03-02 DIAGNOSIS — R7881 Bacteremia: Secondary | ICD-10-CM | POA: Diagnosis not present

## 2021-03-02 DIAGNOSIS — G893 Neoplasm related pain (acute) (chronic): Secondary | ICD-10-CM | POA: Diagnosis not present

## 2021-03-02 DIAGNOSIS — D649 Anemia, unspecified: Secondary | ICD-10-CM | POA: Diagnosis not present

## 2021-03-02 DIAGNOSIS — I10 Essential (primary) hypertension: Secondary | ICD-10-CM | POA: Diagnosis not present

## 2021-03-02 DIAGNOSIS — R509 Fever, unspecified: Secondary | ICD-10-CM | POA: Diagnosis not present

## 2021-03-02 LAB — ECHOCARDIOGRAM COMPLETE
AR max vel: 1.98 cm2
AV Area VTI: 2.13 cm2
AV Area mean vel: 1.98 cm2
AV Mean grad: 3.5 mmHg
AV Peak grad: 7.3 mmHg
Ao pk vel: 1.36 m/s
Area-P 1/2: 3.11 cm2
Calc EF: 80 %
Height: 64 in
S' Lateral: 2.3 cm
Single Plane A2C EF: 83 %
Single Plane A4C EF: 75.3 %
Weight: 3947.12 oz

## 2021-03-02 LAB — BLOOD CULTURE ID PANEL (REFLEXED) - BCID2

## 2021-03-02 LAB — CBC
HCT: 29.9 % — ABNORMAL LOW (ref 36.0–46.0)
Hemoglobin: 8.8 g/dL — ABNORMAL LOW (ref 12.0–15.0)
MCH: 29.7 pg (ref 26.0–34.0)
MCHC: 29.4 g/dL — ABNORMAL LOW (ref 30.0–36.0)
MCV: 101 fL — ABNORMAL HIGH (ref 80.0–100.0)
Platelets: 81 10*3/uL — ABNORMAL LOW (ref 150–400)
RBC: 2.96 MIL/uL — ABNORMAL LOW (ref 3.87–5.11)
RDW: 18 % — ABNORMAL HIGH (ref 11.5–15.5)
WBC: 18.7 10*3/uL — ABNORMAL HIGH (ref 4.0–10.5)
nRBC: 0.2 % (ref 0.0–0.2)

## 2021-03-02 LAB — GLUCOSE, CAPILLARY
Glucose-Capillary: 108 mg/dL — ABNORMAL HIGH (ref 70–99)
Glucose-Capillary: 121 mg/dL — ABNORMAL HIGH (ref 70–99)
Glucose-Capillary: 84 mg/dL (ref 70–99)
Glucose-Capillary: 97 mg/dL (ref 70–99)

## 2021-03-02 MED ORDER — SODIUM CHLORIDE 0.9 % IV SOLN
2.0000 g | INTRAVENOUS | Status: DC
  Administered 2021-03-02 – 2021-03-13 (×69): 2 g via INTRAVENOUS
  Filled 2021-03-02 (×17): qty 2000
  Filled 2021-03-02: qty 2
  Filled 2021-03-02 (×41): qty 2000
  Filled 2021-03-02: qty 2
  Filled 2021-03-02 (×2): qty 2000
  Filled 2021-03-02: qty 2
  Filled 2021-03-02 (×5): qty 2000
  Filled 2021-03-02: qty 2
  Filled 2021-03-02 (×4): qty 2000
  Filled 2021-03-02: qty 2
  Filled 2021-03-02: qty 2000

## 2021-03-02 MED ORDER — CARVEDILOL 6.25 MG PO TABS
6.2500 mg | ORAL_TABLET | Freq: Two times a day (BID) | ORAL | Status: DC
  Administered 2021-03-02 – 2021-03-13 (×24): 6.25 mg via ORAL
  Filled 2021-03-02 (×24): qty 1

## 2021-03-02 MED ORDER — SODIUM CHLORIDE 0.9 % IV SOLN: 2.0000 g | INTRAVENOUS | Status: DC

## 2021-03-02 NOTE — Progress Notes (Signed)
PROGRESS NOTE    Erin [name]  UYQ:[id number] DOB: 12-10-1965 DOA: 02/20/2021 PCP: Merryl Hacker, No   Brief Narrative: Erin [name] is a 56 y.o. 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 Patient presented secondary to nausea/vomiting/diarrhea and abdominal pain. Hospitalization complicated by recurrent anemia. Fecal occult blood test negative. Patient has received 5 units of PRBC to date. Platelets improving. Now found to have bacteremia.   Assessment & Plan:   * Severe anemia- (present on admission) Secondary to chemotherapy. Hemoglobin of 6.0 on admission. Patient has received 5 units of PRBC to date. Most recent rebound hemoglobin of 8.6. -CBC in AM  Severe sepsis [facility]) Not present on admission. Unsure of source. Port site does not appear infected. Associated leukocytosis, fevers, hypotension. Blood cultures (1/21) positive for GPCs with BCID suggesting enterococcus faecalis. Started on ampicillin IV. -Continue ampicillin IV -Follow-up blood culture data -Will discuss with infectious disease in AM in setting of indwelling port -Repeat blood cultures in AM  Hypotension Likely related to combination of anemia, sepsis and iatrogenic from antihypertensives.  Given a 500 mL NS bolus. Improved.  Thrombocytopenia ([place]) Related to chemotherapy. Improving.  Hypokalemia- (present on admission) -Potassium supplementation as needed  Essential hypertension- (present on admission) Patient is on hydrochlorothiazide, Coreg and amlodipine as an outpatient. Coreg and amlodipine continued. Blood pressure mostly well controlled but with hypotension in setting of worsened anemia and sepsis. -Restart Coreg  Somnolence Likely related to oxycodone overnight. She received 10 mg. Oxycodone now reduced to 5 mg dose.  Leukocytosis Likely secondary to  bacteremia  Protein-calorie malnutrition, severe Dietitian recommendations: Continue Boost Breeze TID and Prosource Plus BID. Continue to provide encouragement with eating and drinking as able.   Cancer related pain- (present on admission) -Continue Oxycodone prn  Metastatic adenocarcinoma of unknown origin [facility])- (present on admission) Patient follows with Dr. Benay Spice and is receiving chemotherapy. Recent imaging significant for progression of disease.  Diabetes mellitus (HCC) Hemoglobin A1C of 7.1 -Continue SSI     DVT prophylaxis: SCDs Code Status:   Code Status: Full Code Family Communication: None at bedside Disposition Plan: Discharge to SNF likely in 2+ days pending stable hemoglobin/WBC, blood pressure and outpatient blood culture regimen   Consultants:  Medical oncology Palliative care medicine  Procedures:  None  Antimicrobials: Ampicillin IV    Subjective: No concerns. Patient states she will try to eat more.  Objective: Vitals:   03/02/21 0500 03/02/21 0745 03/02/21 0906 03/02/21 1123  BP:  117/70  (!) 96/59  Pulse:  (!) 110  95  Resp:  (!) 27  20  Temp:  99.4 F (37.4 C) 97.6 F (36.4 C) (!) 97.4 F (36.3 C)  TempSrc:  Axillary Axillary Oral  SpO2:  95%  99%  Weight: 111.9 kg     Height:        Intake/Output Summary (Last 24 hours) at 03/02/2021 1205 Last data filed at 03/01/2021 1758 Gross per 24 hour  Intake 831.34 ml  Output 325 ml  Net 506.34 ml    Filed Weights   02/28/21 0418 03/01/21 0500 03/02/21 0500  Weight: 111.5 kg 115.3 kg 111.9 kg    Examination:  General exam: Appears calm and comfortable  Respiratory system: Clear to auscultation. Respiratory effort normal. Cardiovascular system: S1 & S2 heard, RRR. Gastrointestinal system: Abdomen is distended, soft and mildly tender. Normal bowel sounds heard. Central nervous system: Alert and oriented. No  focal neurological deficits. Musculoskeletal: No edema. No calf  tenderness Skin: No cyanosis. No rashes. Port site without erythema or significant tenderness; no evidence of underlying infection. Psychiatry: Judgement and insight appear normal. Mood & affect appropriate.     Data Reviewed: I have personally reviewed following labs and imaging studies  CBC Lab Results  Component Value Date   WBC 18.7 (H) 03/02/2021   RBC 2.96 (L) 03/02/2021   HGB 8.8 (L) 03/02/2021   HCT 29.9 (L) 03/02/2021   MCV 101.0 (H) 03/02/2021   MCH 29.7 03/02/2021   PLT 81 (L) 03/02/2021   MCHC 29.4 (L) 03/02/2021   RDW 18.0 (H) 03/02/2021   LYMPHSABS 1.1 02/27/2021   MONOABS 0.9 02/27/2021   EOSABS 0.0 02/27/2021   BASOSABS 0.0 86/76/1950     Last metabolic panel Lab Results  Component Value Date   NA 133 (L) 02/28/2021   K 4.2 02/28/2021   CL 104 02/28/2021   CO2 20 (L) 02/28/2021   BUN 26 (H) 02/28/2021   CREATININE 1.08 (H) 02/28/2021   GLUCOSE 126 (H) 02/28/2021   GFRNONAA >60 02/28/2021   GFRAA 78 03/08/2020   CALCIUM 8.7 (L) 02/28/2021   PHOS 2.6 02/22/2021   PROT 5.8 (L) 02/22/2021   ALBUMIN 1.9 (L) 02/22/2021   LABGLOB 3.0 03/08/2020   AGRATIO 1.3 03/08/2020   BILITOT 1.5 (H) 02/22/2021   ALKPHOS 227 (H) 02/22/2021   AST 19 02/22/2021   ALT 6 02/22/2021   ANIONGAP 9 02/28/2021    CBG (last 3)  Recent Labs    03/01/21 2116 03/02/21 0747 03/02/21 1109  GLUCAP 102* 108* 121*      GFR: Estimated Creatinine Clearance: 72.1 mL/min (A) (by C-G formula based on SCr of 1.08 mg/dL (H)).  Coagulation Profile: Recent Labs  Lab 02/24/21 1307 02/27/21 0321  INR 1.6* 1.6*     Recent Results (from the past 240 hour(s))  Resp Panel by RT-PCR (Flu A&B, Covid) Nasopharyngeal Swab     Status: None   Collection Time: 02/20/21  5:46 PM   Specimen: Nasopharyngeal Swab; Nasopharyngeal(NP) swabs in vial transport medium  Result Value Ref Range Status   SARS Coronavirus 2 by RT PCR NEGATIVE NEGATIVE Final    Comment: (NOTE) SARS-CoV-2 target  nucleic acids are NOT DETECTED.  The SARS-CoV-2 RNA is generally detectable in upper respiratory specimens during the acute phase of infection. The lowest concentration of SARS-CoV-2 viral copies this assay can detect is 138 copies/mL. A negative result does not preclude SARS-Cov-2 infection and should not be used as the sole basis for treatment or other patient management decisions. A negative result may occur with  improper specimen collection/handling, submission of specimen other than nasopharyngeal swab, presence of viral mutation(s) within the areas targeted by this assay, and inadequate number of viral copies(<138 copies/mL). A negative result must be combined with clinical observations, patient history, and epidemiological information. The expected result is Negative.  Fact Sheet for Patients:  EntrepreneurPulse.com.au  Fact Sheet for Healthcare Providers:  IncredibleEmployment.be  This test is no t yet approved or cleared by the [place] FDA and  has been authorized for detection and/or diagnosis of SARS-CoV-2 by FDA under an Emergency Use Authorization (EUA). This EUA will remain  in effect (meaning this test can be used) for the duration of the COVID-19 declaration under Section 564(b)(1) of the Act, 21 U.S.C.section 360bbb-3(b)(1), unless the authorization is terminated  or revoked sooner.       Influenza A by PCR NEGATIVE NEGATIVE  Final   Influenza B by PCR NEGATIVE NEGATIVE Final    Comment: (NOTE) The Xpert Xpress SARS-CoV-2/FLU/RSV plus assay is intended as an aid in the diagnosis of influenza from Nasopharyngeal swab specimens and should not be used as a sole basis for treatment. Nasal washings and aspirates are unacceptable for Xpert Xpress SARS-CoV-2/FLU/RSV testing.  Fact Sheet for Patients: EntrepreneurPulse.com.au  Fact Sheet for Healthcare  Providers: IncredibleEmployment.be  This test is not yet approved or cleared by the [place] FDA and has been authorized for detection and/or diagnosis of SARS-CoV-2 by FDA under an Emergency Use Authorization (EUA). This EUA will remain in effect (meaning this test can be used) for the duration of the COVID-19 declaration under Section 564(b)(1) of the Act, 21 U.S.C. section 360bbb-3(b)(1), unless the authorization is terminated or revoked.  Performed at [facility], [place] [address], [place], [place] [address]   Urine Culture     Status: Abnormal   Collection Time: 02/24/21  9:49 AM   Specimen: Urine, Clean Catch  Result Value Ref Range Status   Specimen Description   Final    URINE, CLEAN CATCH Performed at [facility], [place] [address], [place], [place] [address]    Special Requests   Final    NONE Performed at [facility], [place] [address] [place], [place] [address]    Culture (A)  Final    <10,000 COLONIES/mL INSIGNIFICANT GROWTH Performed at [place] [address] [place], [place] [address]    Report Status 02/26/2021 FINAL  Final  Culture, blood (routine x 2)     Status: None (Preliminary result)   Collection Time: 03/01/21 11:03 AM   Specimen: BLOOD  Result Value Ref Range Status   Specimen Description   Final    BLOOD RIGHT ANTECUBITAL Performed at [place] [address] [place], [place] [address]    Special Requests   Final    BOTTLES DRAWN AEROBIC AND ANAEROBIC Blood Culture adequate volume Performed at [place] [address], [place], [place] [address]    Culture  Setup Time   Final    GRAM POSITIVE COCCI IN BOTH AEROBIC AND ANAEROBIC BOTTLES CRITICAL RESULT CALLED TO, READ BACK BY AND VERIFIED WITH: M LILLISTON,PHARMD@[id number]  03/02/21 [place]    Culture   Final    NO GROWTH < 24 HOURS Performed at [facility],  [address] [place], [place] [address]    Report Status PENDING  Incomplete  Culture, blood (routine x 2)     Status: None (Preliminary result)   Collection Time: 03/01/21 11:03 AM   Specimen: BLOOD  Result Value Ref Range Status   Specimen Description   Final    BLOOD BLOOD LEFT HAND Performed at [place] [address] [place], [place] [address]    Special Requests   Final    BOTTLES DRAWN AEROBIC AND ANAEROBIC Blood Culture adequate volume Performed at [place] [address], [place], [place] [address]    Culture  Setup Time   Final    GRAM POSITIVE COCCI IN BOTH AEROBIC AND ANAEROBIC BOTTLES CRITICAL VALUE NOTED.  VALUE IS CONSISTENT WITH PREVIOUSLY REPORTED AND CALLED VALUE.    Culture   Final    NO GROWTH < 24 HOURS Performed at [facility], [place] [address] [place], [place] [address]    Report Status PENDING  Incomplete  Blood Culture ID Panel (  Reflexed)     Status: Abnormal   Collection Time: 03/01/21 11:03 AM  Result Value Ref Range Status   Enterococcus faecalis DETECTED (A) NOT DETECTED Final    Comment: CRITICAL RESULT CALLED TO, READ BACK BY AND VERIFIED WITH: M LILLISTON,PHARMD@[email or web address]  03/02/21 [place]    Enterococcus Faecium NOT DETECTED NOT DETECTED Final   Listeria monocytogenes NOT DETECTED NOT DETECTED Final   Staphylococcus species NOT DETECTED NOT DETECTED Final   Staphylococcus aureus (BCID) NOT DETECTED NOT DETECTED Final   Staphylococcus epidermidis NOT DETECTED NOT DETECTED Final   Staphylococcus lugdunensis NOT DETECTED NOT DETECTED Final   Streptococcus species NOT DETECTED NOT DETECTED Final   Streptococcus agalactiae NOT DETECTED NOT DETECTED Final   Streptococcus pneumoniae NOT DETECTED NOT DETECTED Final   Streptococcus pyogenes NOT DETECTED NOT DETECTED Final   A.calcoaceticus-baumannii NOT DETECTED NOT DETECTED Final   Bacteroides fragilis NOT DETECTED NOT DETECTED Final   Enterobacterales NOT  DETECTED NOT DETECTED Final   Enterobacter cloacae complex NOT DETECTED NOT DETECTED Final   Escherichia coli NOT DETECTED NOT DETECTED Final   Klebsiella aerogenes NOT DETECTED NOT DETECTED Final   Klebsiella oxytoca NOT DETECTED NOT DETECTED Final   Klebsiella pneumoniae NOT DETECTED NOT DETECTED Final   Proteus species NOT DETECTED NOT DETECTED Final   Salmonella species NOT DETECTED NOT DETECTED Final   Serratia marcescens NOT DETECTED NOT DETECTED Final   Haemophilus influenzae NOT DETECTED NOT DETECTED Final   Neisseria meningitidis NOT DETECTED NOT DETECTED Final   Pseudomonas aeruginosa NOT DETECTED NOT DETECTED Final   Stenotrophomonas maltophilia NOT DETECTED NOT DETECTED Final   Candida albicans NOT DETECTED NOT DETECTED Final   Candida auris NOT DETECTED NOT DETECTED Final   Candida glabrata NOT DETECTED NOT DETECTED Final   Candida krusei NOT DETECTED NOT DETECTED Final   Candida parapsilosis NOT DETECTED NOT DETECTED Final   Candida tropicalis NOT DETECTED NOT DETECTED Final   Cryptococcus neoformans/gattii NOT DETECTED NOT DETECTED Final   Vancomycin resistance NOT DETECTED NOT DETECTED Final    Comment: Performed at [facility], [address] [place], [place] [address]         Radiology Studies: No results found.      Scheduled Meds:  (feeding supplement) PROSource Plus  30 mL Oral BID BM   carvedilol  6.25 mg Oral BID WC   Chlorhexidine Gluconate Cloth  6 each Topical Daily   feeding supplement  1 Container Oral TID BM   insulin aspart  0-15 Units Subcutaneous TID WC   insulin aspart  0-5 Units Subcutaneous QHS   mouth rinse  15 mL Mouth Rinse BID   multivitamin with minerals  1 tablet Oral Daily   pantoprazole  40 mg Oral Daily   Continuous Infusions:  ampicillin (OMNIPEN) IV 2 g (03/02/21 1113)     LOS: 10 days     Cordelia Poche, MD [facility] 03/02/2021, 12:05 PM  If 7PM-7AM, please contact night-coverage [email or web address]

## 2021-03-02 NOTE — Progress Notes (Signed)
PHARMACY - PHYSICIAN COMMUNICATION CRITICAL VALUE ALERT - BLOOD CULTURE IDENTIFICATION (BCID)  Erin [name] is an 56 y.o. female who presented to [facility] on 02/20/2021 with a chief complaint of N/V/D and generalized weakness.   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   WBC started uptrend several days ago and patient has spiked fevers (currently afebrile) and been hypotensive.  No source of infection noted but blood cx obtained 1/21 are growing GPCC in 4/4 bottles.  BCID + enterococcus faecalis, no resistance detected.   Name of physician (or Provider) Contacted: X Blount  Current antibiotics: none  Changes to prescribed antibiotics recommended:  Start Ampicillin 2gm IV q4h ID service to be auto-consulted F/U final cx data, ID recommendations  Results for orders placed or performed during the hospital encounter of 02/20/21  Blood Culture ID Panel (Reflexed) (Collected: 03/01/2021 11:03 AM)  Result Value Ref Range   Enterococcus faecalis DETECTED (A) NOT DETECTED   Enterococcus Faecium NOT DETECTED NOT DETECTED   Listeria monocytogenes NOT DETECTED NOT DETECTED   Staphylococcus species NOT DETECTED NOT DETECTED   Staphylococcus aureus (BCID) NOT DETECTED NOT DETECTED   Staphylococcus epidermidis NOT DETECTED NOT DETECTED   Staphylococcus lugdunensis NOT DETECTED NOT DETECTED   Streptococcus species NOT DETECTED NOT DETECTED   Streptococcus agalactiae NOT DETECTED NOT DETECTED   Streptococcus pneumoniae NOT DETECTED NOT DETECTED   Streptococcus pyogenes NOT DETECTED NOT DETECTED   A.calcoaceticus-baumannii NOT DETECTED NOT DETECTED   Bacteroides fragilis NOT DETECTED NOT DETECTED   Enterobacterales NOT DETECTED NOT DETECTED   Enterobacter cloacae complex NOT DETECTED NOT DETECTED   Escherichia coli  NOT DETECTED NOT DETECTED   Klebsiella aerogenes NOT DETECTED NOT DETECTED   Klebsiella oxytoca NOT DETECTED NOT DETECTED   Klebsiella pneumoniae NOT DETECTED NOT DETECTED   Proteus species NOT DETECTED NOT DETECTED   Salmonella species NOT DETECTED NOT DETECTED   Serratia marcescens NOT DETECTED NOT DETECTED   Haemophilus influenzae NOT DETECTED NOT DETECTED   Neisseria meningitidis NOT DETECTED NOT DETECTED   Pseudomonas aeruginosa NOT DETECTED NOT DETECTED   Stenotrophomonas maltophilia NOT DETECTED NOT DETECTED   Candida albicans NOT DETECTED NOT DETECTED   Candida auris NOT DETECTED NOT DETECTED   Candida glabrata NOT DETECTED NOT DETECTED   Candida krusei NOT DETECTED NOT DETECTED   Candida parapsilosis NOT DETECTED NOT DETECTED   Candida tropicalis NOT DETECTED NOT DETECTED   Cryptococcus neoformans/gattii NOT DETECTED NOT DETECTED   Vancomycin resistance NOT DETECTED NOT DETECTED    Netta Cedars PharmD 03/02/2021  6:01 AM

## 2021-03-02 NOTE — Consult Note (Signed)
[facility]ious [facility]    Date of Admission:  02/20/2021     Reason for Consult: e faecalis bacteremia    Referring Provider: Charise Carwin     Lines:  11/17-c right chest port  Abx: 1/22-c ampicillin        Assessment: 56 yo female poorly differentiated metastatic adeno on current FOLFOX chemo, osa, hx gastric cancer, dm2 admitted 1/12 for n/v/diarrhea/abd pain, course complicated by hospital onset e faecalis blood stream infection  She has had no recent antibiotics prior to positive blood culture  1/21 bcx e faecalis 1/16 ucx no significant growth  Of note, I am not sure if this is hospital onset bsi. She has been having sepsis since prior to admission. However, no blood cultures were obtained until HD # 10.   She is at risk for endocarditis/line infection and will need w/u for this    Plan: Oncology team had recommended hospice given complication of chemo and continued progression of malignant process At this time no obvious serious complication from enterococcal bacteremia I agree possibly ongoing hospice/palliative care engagement, and pending that discussion will see if further workup needed Often e faecalis bacteremia is a sign of severe overall disease and mortality is related to other underlying morbidities If full care desired still see below: Repeat blood culture today An mri brain might be needed given metastatic cancer and declining mentation Please discuss with oncology possibility of central line infection and feasibility of need for removal TTE -- if negative will still need tee Agree with continuing ampicillin at this time; abx change dependent of if endocarditis present Discussed with primary team     ------------------------------------------------ Principal Problem:   Severe anemia Active Problems:   Essential hypertension   Diabetes mellitus (HCC)   OSA (obstructive sleep apnea)   Obesity hypoventilation syndrome  (HCC)   Hypokalemia   Adenocarcinoma (HCC)   Metastatic adenocarcinoma of unknown origin ([place])   Cancer related pain   Severe sepsis ([place])   Protein-calorie malnutrition, severe   Thrombocytopenia ([place])   Leukocytosis   Somnolence   Hypotension    HPI: Erin [name] is a 56 y.o. female admitted 1/12 with sepsis/n/v/abd pain now with hospital onset bacteremia  Oncologic hx, per hem's note on 02/28/21: #poorly diff adeno with signet ring features; question gastric primary -metastatic w/u with 11/2020 mri showing bilateral ovarian mass, pelvic mass near urethra, patchy pelvis osseous abnormality, pelvic lymphadenopathy -12/2020 mri lumbar spine with diffuse abnormal osseous marrow signal -12/2020 chest ct no concern for metastatic lesion -12/2020 upper endoscopy biopsy gastric nodule with poorly diff adeno signet ring feature -12/2020 colonoscoyp with tubulovillous adenoma but no high grade dysplasia -02/20/21 ct abd pelv increased size of bilateral ovarian masses and progress pelvis lymphadenopathy, increased left adrenal metastasis, interval nodular densities at lung bases, and new mild right hydronephrosis -treatment: 12/27/2020 cycle 1 folfox; 01/27/2021 cycle 3 -on 02/28/21 visit also noted decreased cognition. Other complication thrombocytopenia. Not candidate for further chemo. Recommends hospice  She was referred on 1/12 from oncology clinic for ongoing n/v/failure to thrive  She is slow in mentation during interview. Really doesn't remember much. Mentioned the nausea/vomiting/diarrhea which she no longer have or is much better  No issue with right chest port No focal discomfort No headache/visual change No rash No dyspnea/cough/chest pain   Has had low grade fever since admission, but no previous blood culture. No abx given this admission until 1/21 bcx obtained and returned  with e faecalis  Palliative care had seen her and at this time patient desired full medical  tx, and potentially salvage chemo   Family History  Problem Relation Age of Onset   Stroke Mother    Diabetes Mother    Hypertension Mother    Obesity Mother    Glaucoma Father    Alcoholism Father    Breast cancer Maternal Aunt    Renal cancer Maternal Uncle    Prostate cancer Maternal Grandfather    Heart disease Paternal Grandmother        enlarged heart   Diabetes Half-Sister    Hypertension Half-Sister    Colon cancer Neg Hx    Esophageal cancer Neg Hx    Stomach cancer Neg Hx    Colon polyps Neg Hx    Rectal cancer Neg Hx    Ovarian cancer Neg Hx    Endometrial cancer Neg Hx    Pancreatic cancer Neg Hx     Social History   Tobacco Use   Smoking status: Former    Years: 15.00    Types: Cigarettes    Quit date: 2001    Years since quitting: 22.0   Smokeless tobacco: Never  Vaping Use   Vaping Use: Never used  Substance Use Topics   Alcohol use: No   Drug use: Not Currently    Types: Marijuana    Allergies  Allergen Reactions   Tramadol Other (See Comments)    Per patient increased HR and sweating    Review of Systems: ROS All Other ROS was negative, except mentioned above   Past Medical History:  Diagnosis Date   Anxiety    Constipation    Depression    GERD (gastroesophageal reflux disease)    Pt states she no longer needs meds   Hyperlipidemia    Hypertension    Menorrhagia    OSA (obstructive sleep apnea) 11/25/2017   OSA on CPAP    Other fatigue    Shortness of breath on exertion    Type 2 diabetes mellitus ([place])    Vitamin D deficiency    Wears glasses        Scheduled Meds:  (feeding supplement) PROSource Plus  30 mL Oral BID BM   carvedilol  6.25 mg Oral BID WC   Chlorhexidine Gluconate Cloth  6 each Topical Daily   feeding supplement  1 Container Oral TID BM   insulin aspart  0-15 Units Subcutaneous TID WC   insulin aspart  0-5 Units Subcutaneous QHS   mouth rinse  15 mL Mouth Rinse BID   multivitamin with minerals  1  tablet Oral Daily   pantoprazole  40 mg Oral Daily   Continuous Infusions:  ampicillin (OMNIPEN) IV 2 g (03/02/21 1113)   PRN Meds:.acetaminophen **OR** acetaminophen, liver oil-zinc oxide, loperamide, metoCLOPramide (REGLAN) injection, ondansetron (ZOFRAN) IV, oxyCODONE   OBJECTIVE: Blood pressure (!) 96/59, pulse 95, temperature (!) 97.4 F (36.3 C), temperature source Oral, resp. rate 20, height 5\' 4"  (1.626 m), weight 111.9 kg, SpO2 99 %.  Physical Exam General/constitutional: chronically ill appearing; no distress; conversant; slow mentation; at times incoherant HEENT: Normocephalic, PER, Conj Clear, EOMI, Oropharynx clear Neck supple CV: rrr no mrg Lungs: clear to auscultation, normal respiratory effort Abd: Soft, Nontender Ext: no edema Skin: No Rash Neuro: generalized weakness; no tremor/rigidity MSK: no peripheral joint swelling/tenderness/warmth   Central line presence: right chest port site no tenderness/purulence    Lab Results Lab Results  Component Value Date  WBC 18.7 (H) 03/02/2021   HGB 8.8 (L) 03/02/2021   HCT 29.9 (L) 03/02/2021   MCV 101.0 (H) 03/02/2021   PLT 81 (L) 03/02/2021    Lab Results  Component Value Date   CREATININE 1.08 (H) 02/28/2021   BUN 26 (H) 02/28/2021   NA 133 (L) 02/28/2021   K 4.2 02/28/2021   CL 104 02/28/2021   CO2 20 (L) 02/28/2021    Lab Results  Component Value Date   ALT 6 02/22/2021   AST 19 02/22/2021   ALKPHOS 227 (H) 02/22/2021   BILITOT 1.5 (H) 02/22/2021      Microbiology: Recent Results (from the past 240 hour(s))  Resp Panel by RT-PCR (Flu A&B, Covid) Nasopharyngeal Swab     Status: None   Collection Time: 02/20/21  5:46 PM   Specimen: Nasopharyngeal Swab; Nasopharyngeal(NP) swabs in vial transport medium  Result Value Ref Range Status   SARS Coronavirus 2 by RT PCR NEGATIVE NEGATIVE Final    Comment: (NOTE) SARS-CoV-2 target nucleic acids are NOT DETECTED.  The SARS-CoV-2 RNA is generally  detectable in upper respiratory specimens during the acute phase of infection. The lowest concentration of SARS-CoV-2 viral copies this assay can detect is 138 copies/mL. A negative result does not preclude SARS-Cov-2 infection and should not be used as the sole basis for treatment or other patient management decisions. A negative result may occur with  improper specimen collection/handling, submission of specimen other than nasopharyngeal swab, presence of viral mutation(s) within the areas targeted by this assay, and inadequate number of viral copies(<138 copies/mL). A negative result must be combined with clinical observations, patient history, and epidemiological information. The expected result is Negative.  Fact Sheet for Patients:  EntrepreneurPulse.com.au  Fact Sheet for Healthcare Providers:  IncredibleEmployment.be  This test is no t yet approved or cleared by the [place] FDA and  has been authorized for detection and/or diagnosis of SARS-CoV-2 by FDA under an Emergency Use Authorization (EUA). This EUA will remain  in effect (meaning this test can be used) for the duration of the COVID-19 declaration under Section 564(b)(1) of the Act, 21 U.S.C.section 360bbb-3(b)(1), unless the authorization is terminated  or revoked sooner.       Influenza A by PCR NEGATIVE NEGATIVE Final   Influenza B by PCR NEGATIVE NEGATIVE Final    Comment: (NOTE) The Xpert Xpress SARS-CoV-2/FLU/RSV plus assay is intended as an aid in the diagnosis of influenza from Nasopharyngeal swab specimens and should not be used as a sole basis for treatment. Nasal washings and aspirates are unacceptable for Xpert Xpress SARS-CoV-2/FLU/RSV testing.  Fact Sheet for Patients: EntrepreneurPulse.com.au  Fact Sheet for Healthcare Providers: IncredibleEmployment.be  This test is not yet approved or cleared by the [place] FDA  and has been authorized for detection and/or diagnosis of SARS-CoV-2 by FDA under an Emergency Use Authorization (EUA). This EUA will remain in effect (meaning this test can be used) for the duration of the COVID-19 declaration under Section 564(b)(1) of the Act, 21 U.S.C. section 360bbb-3(b)(1), unless the authorization is terminated or revoked.  Performed at [facility], [place] [address], [place],  [address]   Urine Culture     Status: Abnormal   Collection Time: 02/24/21  9:49 AM   Specimen: Urine, Clean Catch  Result Value Ref Range Status   Specimen Description   Final    URINE, CLEAN CATCH Performed at [facility], [place] [address], [place],  [address]  Special Requests   Final    NONE Performed at [facility], [place] [address] [place], [place] [address]    Culture (A)  Final    <10,000 COLONIES/mL INSIGNIFICANT GROWTH Performed at [facility] [address], [place], [place] [address]    Report Status 02/26/2021 FINAL  Final  Culture, blood (routine x 2)     Status: None (Preliminary result)   Collection Time: 03/01/21 11:03 AM   Specimen: BLOOD  Result Value Ref Range Status   Specimen Description   Final    BLOOD RIGHT ANTECUBITAL Performed at [facility] [address], [place], [place] [address]    Special Requests   Final    BOTTLES DRAWN AEROBIC AND ANAEROBIC Blood Culture adequate volume Performed at [facility] [address] [place], [place] [address]    Culture  Setup Time   Final    GRAM POSITIVE COCCI IN BOTH AEROBIC AND ANAEROBIC BOTTLES CRITICAL RESULT CALLED TO, READ BACK BY AND VERIFIED WITH: M LILLISTON,[id number]  03/02/21 [place]    Culture   Final    NO GROWTH < 24 HOURS Performed at [facility], [place] [address] [place], [place] [address]    Report Status PENDING  Incomplete  Culture, blood (routine x 2)     Status:  None (Preliminary result)   Collection Time: 03/01/21 11:03 AM   Specimen: BLOOD  Result Value Ref Range Status   Specimen Description   Final    BLOOD BLOOD LEFT HAND Performed at [facility] [address], [place], [place] [address]    Special Requests   Final    BOTTLES DRAWN AEROBIC AND ANAEROBIC Blood Culture adequate volume Performed at [facility] [address], [place], [place] [address]    Culture  Setup Time   Final    GRAM POSITIVE COCCI IN BOTH AEROBIC AND ANAEROBIC BOTTLES CRITICAL VALUE NOTED.  VALUE IS CONSISTENT WITH PREVIOUSLY REPORTED AND CALLED VALUE.    Culture   Final    NO GROWTH < 24 HOURS Performed at [facility], [place] [address], [place],  [address]    Report Status PENDING  Incomplete  Blood Culture ID Panel (Reflexed)     Status: Abnormal   Collection Time: 03/01/21 11:03 AM  Result Value Ref Range Status   Enterococcus faecalis DETECTED (A) NOT DETECTED Final    Comment: CRITICAL RESULT CALLED TO, READ BACK BY AND VERIFIED WITH: M LILLISTON,[email or web address]  03/02/21 [place]    Enterococcus Faecium NOT DETECTED NOT DETECTED Final   Listeria monocytogenes NOT DETECTED NOT DETECTED Final   Staphylococcus species NOT DETECTED NOT DETECTED Final   Staphylococcus aureus (BCID) NOT DETECTED NOT DETECTED Final   Staphylococcus epidermidis NOT DETECTED NOT DETECTED Final   Staphylococcus lugdunensis NOT DETECTED NOT DETECTED Final   Streptococcus species NOT DETECTED NOT DETECTED Final   Streptococcus agalactiae NOT DETECTED NOT DETECTED Final   Streptococcus pneumoniae NOT DETECTED NOT DETECTED Final   Streptococcus pyogenes NOT DETECTED NOT DETECTED Final   A.calcoaceticus-baumannii NOT DETECTED NOT DETECTED Final   Bacteroides fragilis NOT DETECTED NOT DETECTED Final   Enterobacterales NOT DETECTED NOT DETECTED Final   Enterobacter cloacae complex NOT DETECTED NOT DETECTED Final   Escherichia coli NOT  DETECTED NOT DETECTED Final   Klebsiella aerogenes NOT DETECTED NOT DETECTED Final   Klebsiella oxytoca NOT DETECTED NOT DETECTED Final   Klebsiella pneumoniae NOT DETECTED NOT DETECTED Final  Proteus species NOT DETECTED NOT DETECTED Final   Salmonella species NOT DETECTED NOT DETECTED Final   Serratia marcescens NOT DETECTED NOT DETECTED Final   Haemophilus influenzae NOT DETECTED NOT DETECTED Final   Neisseria meningitidis NOT DETECTED NOT DETECTED Final   Pseudomonas aeruginosa NOT DETECTED NOT DETECTED Final   Stenotrophomonas maltophilia NOT DETECTED NOT DETECTED Final   Candida albicans NOT DETECTED NOT DETECTED Final   Candida auris NOT DETECTED NOT DETECTED Final   Candida glabrata NOT DETECTED NOT DETECTED Final   Candida krusei NOT DETECTED NOT DETECTED Final   Candida parapsilosis NOT DETECTED NOT DETECTED Final   Candida tropicalis NOT DETECTED NOT DETECTED Final   Cryptococcus neoformans/gattii NOT DETECTED NOT DETECTED Final   Vancomycin resistance NOT DETECTED NOT DETECTED Final    Comment: Performed at [facility], [address], [place], [place] [address]     Serology:    Imaging: If present, new imagings (plain films, ct scans, and mri) have been personally visualized and interpreted; radiology reports have been reviewed. Decision making incorporated into the Impression / Recommendations.  1/14 head ct normal  1/12 cxr Mild vascular congestion  1/12 abd pelv ct noncon 1. Increased size of bilateral ovarian masses, especially on the left. 2. Interval diffuse bony metastatic disease. 3. Progressive metastatic bilateral pelvic and inguinal lymphadenopathy. 4. Increased size of a left adrenal metastasis. 5. Interval nodular densities at the lung bases suspicious for metastases. 6. Interval mild right hydronephrosis and hydroureter to the level of the ureterovesical junction. This could be due to recent stone passage, obstruction by wall  thickening of the bladder or a nonvisualized obstructing ureteral metastasis.  Jabier Mutton, [facility] for Infectious [place] [phone] pager    03/02/2021, 1:43 PM

## 2021-03-02 NOTE — Progress Notes (Signed)
Pt placed on cpap with auto settings

## 2021-03-02 NOTE — Progress Notes (Signed)
Patient continues to have poor PO.  No fluid PO intake reported overnight.  Takes small sips of ice water with pills and when prompted, otherwise no evidence of voluntary intake.  Refusing scheduled Boost and ProSource.  Took one bite of breakfast (that she ordered via RN) and spit it out, saying, "I can't eat this."  Slightly elevated temperature this AM (99.4 axillary).  Tylenol administered for breakthrough pain and temperature rechecked, 97.4 axillary.  Patient diaphoretic in bed, delayed responses, alert.  Angie Fava, RN

## 2021-03-02 NOTE — Progress Notes (Signed)
° °  Echocardiogram 2D Echocardiogram has been performed.  Erin [name] 03/02/2021, 3:19 PM

## 2021-03-02 NOTE — Progress Notes (Addendum)
RN asked patient if she wanted to order any lunch, patient said, "Not really."  RN offered ice cream, patient declined.  RN offered water, chicken broth, patient said, "Not just yet."  Patient was assisted (x2) to [facility] for bath, then transferred to chair.  Patient requested to go back to bed after 30 minutes.  Angie Fava, RN

## 2021-03-03 ENCOUNTER — Inpatient Hospital Stay (HOSPITAL_COMMUNITY): Payer: 59

## 2021-03-03 DIAGNOSIS — D72829 Elevated white blood cell count, unspecified: Secondary | ICD-10-CM

## 2021-03-03 DIAGNOSIS — C801 Malignant (primary) neoplasm, unspecified: Secondary | ICD-10-CM | POA: Diagnosis not present

## 2021-03-03 DIAGNOSIS — B952 Enterococcus as the cause of diseases classified elsewhere: Secondary | ICD-10-CM | POA: Diagnosis not present

## 2021-03-03 DIAGNOSIS — G893 Neoplasm related pain (acute) (chronic): Secondary | ICD-10-CM | POA: Diagnosis not present

## 2021-03-03 DIAGNOSIS — R509 Fever, unspecified: Secondary | ICD-10-CM | POA: Diagnosis not present

## 2021-03-03 DIAGNOSIS — D649 Anemia, unspecified: Secondary | ICD-10-CM | POA: Diagnosis not present

## 2021-03-03 DIAGNOSIS — R7881 Bacteremia: Secondary | ICD-10-CM | POA: Diagnosis not present

## 2021-03-03 DIAGNOSIS — I10 Essential (primary) hypertension: Secondary | ICD-10-CM | POA: Diagnosis not present

## 2021-03-03 LAB — TYPE AND SCREEN
ABO/RH(D): O POS
Antibody Screen: NEGATIVE
Unit division: 0
Unit division: 0

## 2021-03-03 LAB — CBC
HCT: 24.1 % — ABNORMAL LOW (ref 36.0–46.0)
Hemoglobin: 7.8 g/dL — ABNORMAL LOW (ref 12.0–15.0)
MCH: 30 pg (ref 26.0–34.0)
MCHC: 32.4 g/dL (ref 30.0–36.0)
MCV: 92.7 fL (ref 80.0–100.0)
Platelets: 106 10*3/uL — ABNORMAL LOW (ref 150–400)
RBC: 2.6 MIL/uL — ABNORMAL LOW (ref 3.87–5.11)
RDW: 17.2 % — ABNORMAL HIGH (ref 11.5–15.5)
WBC: 22.2 10*3/uL — ABNORMAL HIGH (ref 4.0–10.5)
nRBC: 0 % (ref 0.0–0.2)

## 2021-03-03 LAB — BASIC METABOLIC PANEL
Anion gap: 8 (ref 5–15)
BUN: 31 mg/dL — ABNORMAL HIGH (ref 6–20)
CO2: 20 mmol/L — ABNORMAL LOW (ref 22–32)
Calcium: 8.4 mg/dL — ABNORMAL LOW (ref 8.9–10.3)
Chloride: 102 mmol/L (ref 98–111)
Creatinine, Ser: 0.95 mg/dL (ref 0.44–1.00)
GFR, Estimated: >60 mL/min (ref >60)
Glucose, Bld: 102 mg/dL — ABNORMAL HIGH (ref 70–99)
Potassium: 4.2 mmol/L (ref 3.5–5.1)
Sodium: 130 mmol/L — ABNORMAL LOW (ref 135–145)

## 2021-03-03 LAB — BPAM RBC
Blood Product Expiration Date: 202302142359
Blood Product Expiration Date: 202302212359
ISSUE DATE / TIME: 202301191334
ISSUE DATE / TIME: 202301211333
Unit Type and Rh: 5100
Unit Type and Rh: 5100

## 2021-03-03 LAB — GLUCOSE, CAPILLARY
Glucose-Capillary: 93 mg/dL (ref 70–99)
Glucose-Capillary: 88 mg/dL (ref 70–99)
Glucose-Capillary: 94 mg/dL (ref 70–99)
Glucose-Capillary: 85 mg/dL (ref 70–99)

## 2021-03-03 MED ORDER — ENSURE ENLIVE PO LIQD
237.0000 mL | ORAL | Status: DC
  Administered 2021-03-03 – 2021-03-08 (×2): 237 mL via ORAL

## 2021-03-03 MED ORDER — BOOST / RESOURCE BREEZE PO LIQD CUSTOM: 1.0000 | Freq: Two times a day (BID) | ORAL | Status: DC

## 2021-03-03 MED ORDER — GADOBUTROL 1 MMOL/ML IV SOLN
10.0000 mL | Freq: Once | INTRAVENOUS | Status: AC | PRN
  Administered 2021-03-03: 10 mL via INTRAVENOUS

## 2021-03-03 NOTE — Progress Notes (Signed)
Nutrition Follow-up  DOCUMENTATION CODES:   Severe malnutrition in context of chronic illness, Morbid obesity  INTERVENTION:  - will decrease Boost Breeze from TID to BID, each supplement provides 250 kcal and 9 grams of protein. - continue 30 ml Prosource Plus BID, each supplement provides 100 kcal and 15 grams protein.  - will order Ensure Plus High Protein once/day, each supplement provides 350 kcal and 20 grams of protein. - able to communicate with Med Onc NP and Leona RD via secure chat.  - recommend family bring in foods and beverages that patient enjoys and to eat with her when feasible to encourage intakes.    NUTRITION DIAGNOSIS:   Severe Malnutrition related to chronic illness, cancer and cancer related treatments as evidenced by mild fat depletion, mild muscle depletion, percent weight loss, energy intake < 75% for > or equal to 1 month. -ongoing  GOAL:   Patient will meet greater than or equal to 90% of their needs -unmet  MONITOR:   PO intake, Supplement acceptance, Labs, Weight trends, I & O's   ASSESSMENT:   56 y.o. female with medical history of poorly differentiated metastatic adenocarcinoma with suspicion of gastric primary, undergoing chemo (folfox, last: 01/27/21), OSA, OHS, gastric cancer, HTN, and DM. she presented to the ED due to N/V/D, abdominal cramping, and weakness.  Per review of flow sheet documentation, she has been eating 0-10% at meals since breakfast on 1/19.  Per review of orders, she has been accepting Prosource Plus 100% of the time over the past 1 week and she has been accepting Boost Breeze 100% of the time offered over the past 1 week. Question how much of each bottle of Boost Breeze she has consumed as RN reports patient refused to drink any of it this AM and patient reports to RD that she does not really like juice.   Able to talk with RN before and after visit to patient's room. Patient refusing all PO items for RN this AM.   RD  attempted to offer all items on breakfast tray to patient, one at a time, and she refused all of them. Attempted to determine what patient may be willing to take a bite or sip of and she shares that she does not want anything but may be agreeable later today.   Weight has been trending up since admission and is currently +22 lb compared to weight on 1/13.   Yesterday she was noted to have moderate pitting edema to BLE and today it is documented as mild pitting edema to BLE. She is noted to be +2.4 L since admission.      Labs reviewed; CBGs: 93 and 88 mg/dl, Na: 130 mmol/l, BUN: 31 mg/dl, Ca: 8.4 mg/dl.   Medications reviewed; sliding scale novolog, PRN imodium, 1 tablet multivitamin with minerals/day, 40 mg oral protonix/day.    Diet Order:   Diet Order             DIET SOFT Room service appropriate? Yes; Fluid consistency: Thin  Diet effective now                   EDUCATION NEEDS:   No education needs have been identified at this time  Skin:  Skin Assessment: Reviewed RN Assessment  Last BM:  1/22 (type 5, small amount)  Height:   Ht Readings from Last 1 Encounters:  02/20/21 5\' 4"  (1.626 m)    Weight:   Wt Readings from Last 1 Encounters:  03/03/21 116.5  kg     Estimated Nutritional Needs:  Kcal:  2150-2350 kcal Protein:  115-130 grams Fluid:  >/= 2.2 L/day     Jarome Matin, MS, RD, LDN Inpatient Clinical Dietitian RD pager # available in [place]  After hours/weekend pager # available in [facility]

## 2021-03-03 NOTE — Progress Notes (Signed)
Physical Therapy Treatment Patient Details Name: Erin [name] MRN: [id number] DOB: 08/03/1965 Today's Date: 03/03/2021   History of Present Illness 56 y.o. 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  CT scan of the abdomen and pelvis showed increasing pulmonary mass with new bone metastases and increased in size of left adrenal metastases as well as along nodules.  Pt admitted 02/20/21 for Severe anemia symptomatic anemia due to antineoplastic drug and Poorly differentiated metastatic adenocarcinoma of lung clear primary    PT Comments    Pt in bed soaked in urine/loose stools.  Assisted OOB to [facility] and assist with peri care required + 2 assist.  Pt also slow to verbally respond and slow to activiely move.  She is AxO 2 able to follow commands and share some Hx.  Pt familiar from last November.  Pt c/o ABD cramping/pain and feeling "really tired".  Assisted OOB to [facility] was difficult. General bed mobility comments: required increased assist this session and total Assist pulling bed pad to complete scooting.  Once upright and centered, pt was able to static sit EOB at Supervision level.General transfer comment: required increased asisst this session to rise from elevated bed and complete 1/4 pivot turn to [facility].  Poor forward posture and MAX c/o weakness/fatigue. Assisted back to bed by switching BSC with bed from behind pt as she stood.  Assisted back to bed required Total Assist pt <5%.  Positioned to comfort.   Pt will need ST Rehab at SNF to address her  decline in mobility.   Recommendations for follow up therapy are one component of a multi-disciplinary discharge planning process, led by the attending physician.  Recommendations may be updated based on patient status, additional functional criteria and insurance authorization.  Follow Up Recommendations  Skilled nursing-short term rehab (<3 hours/day)     Assistance Recommended at Discharge Frequent or constant Supervision/Assistance  Patient can return home with the following A lot of help with walking and/or transfers;A lot of help with bathing/dressing/bathroom;Help with stairs or ramp for entrance;Two people to help with walking and/or transfers;Two people to help with bathing/dressing/bathroom;Assist for transportation;Assistance with cooking/housework   Equipment Recommendations  None recommended by PT    Recommendations for Other Services       Precautions / Restrictions Precautions Precautions: Fall Restrictions Weight Bearing Restrictions: No     Mobility  Bed Mobility Overal bed mobility: Needs Assistance Bed Mobility: Supine to Sit, Sit to Supine     Supine to sit: Max assist Sit to supine: Total assist, +2 for physical assistance   General bed mobility comments: required increased assist this session and total Assist pulling bed pad to complete scooting.  Once upright and centered, pt was able to static sit EOB at Supervision level.    Transfers Overall transfer level: Needs assistance Equipment used: Rolling walker (2 wheels) Transfers: Sit to/from Stand Sit to Stand: Max assist, +2 physical assistance, +2 safety/equipment           General transfer comment: required increased asisst this session to rise from elevated bed and complete 1/4 pivot  turn to [facility].  Poor forward posture and MAX c/o weakness/fatigue.    Ambulation/Gait               General Gait Details: transfer on/off BSC only this session due to Max c/o weakness/fatigue and inability  to stand upright.   Stairs             Wheelchair Mobility    Modified Rankin (Stroke Patients Only)       Balance                                            Cognition Arousal/Alertness: Awake/alert Behavior During Therapy: Flat affect Overall Cognitive Status: Impaired/Different from baseline                                 General Comments: Pt still requiring significantly increased time for response and at times would repeat what therapist said.  Slow delayed motor control also present.  Groggy.        Exercises      General Comments        Pertinent Vitals/Pain Pain Assessment Pain Assessment: Faces Faces Pain Scale: Hurts even more Pain Location: ABD Pain Descriptors / Indicators: Aching, Cramping Pain Intervention(s): Monitored during session    Home Living                          Prior Function            PT Goals (current goals can now be found in the care plan section) Progress towards PT goals: Progressing toward goals    Frequency    Min 2X/week      PT Plan Frequency needs to be updated    Co-evaluation              AM-PAC PT "6 Clicks" Mobility   Outcome Measure  Help needed turning from your back to your side while in a flat bed without using bedrails?: A Lot Help needed moving from lying on your back to sitting on the side of a flat bed without using bedrails?: A Lot Help needed moving to and from a bed to a chair (including a wheelchair)?: A Lot Help needed standing up from a chair using your arms (e.g., wheelchair or bedside chair)?: Total Help needed to walk in hospital room?: Total Help needed climbing 3-5 steps with a railing? : Total 6 Click Score: 9    End of Session Equipment Utilized During Treatment: Gait belt Activity Tolerance: Patient limited by fatigue Patient left: in bed;with call bell/phone within reach;with bed alarm set Nurse Communication: Mobility  status PT Visit Diagnosis: Difficulty in walking, not elsewhere classified (R26.2);Muscle weakness (generalized) (M62.81)     Time: 0350-0938 PT Time Calculation (min) (ACUTE ONLY): 26 min  Charges:  $Therapeutic Activity: 23-37 mins                     Rica Koyanagi  PTA Acute  Rehabilitation Services Pager      [phone] Office      [phone]

## 2021-03-03 NOTE — Progress Notes (Addendum)
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 -Bladder neck, urethra, cervical biopsies 12/17/2020 -MSS, tumor mutation burden 4, K-ras amplification, PD-L1 tumor proportion score-0 -[facility] -90% intestinal malignancy (colorectal adenocarcinoma 85%) small intestine adenocarcinoma less than 5%, gastroesophageal adenocarcinoma not excluded -12/05/2020 MRI of the pelvis-poorly marginated enhancing 3.7 x 2.8 x 3.0 cm mass centered at the urethra, solid avidly enhancing bilateral ovarian masses, poorly marginated enhancing 2.4 x 2.3 x 3.0 cm mass in the left uterine cervix, mild to moderate bilateral common iliac, bilateral external iliac, and bilateral inguinal lymphadenopathy, diffuse patchy confluent nodular replacement of the pelvic osseous structures. -12/13/2020 CEA 1864, CA125 29.2 -12/16/2020 CT abdomen/pelvis-rapidly increasing size of the left ovary with some adjacent ascites, enlarging masses elsewhere, masslike area of the cervix and potentially within a urethral diverticulum, well-circumscribed left adrenal lesion measuring 3.2 x 2.6 cm, heterogeneous pattern of subtle sclerosis and lucency in the spine. -12/16/2020 MRI of the lumbar spine-diffusely abnormal appearance of the bone marrow throughout the visualized lumbar spine and pelvis highly suspicious for diffuse osseous metastatic disease, retroperitoneal/iliac adenopathy with left larger than right adnexal masses. -12/17/2020 CT chest-no acute intrathoracic pathology -Upper endoscopy 12/22/2020-gastritis, gastric nodule biopsy-adenocarcinoma, poorly differentiated with signet ring morphology -Colonoscopy 12/22/2020-polyps removed from the ascending and sigmoid colon, extrinsic compression of the sigmoid colon-tubulovillous adenoma without high-grade dysplasia, tubular adenoma and hyperplastic polyps -12/27/2020 cycle #1 FOLFOX -01/10/2021  cycle #2 FOLFOX -01/27/2021 cycle 3 FOLFOX, oxaliplatin dose reduced -02/20/2021 CT abdomen/pelvis without contrast-increased size of the bilateral ovarian masses, interval diffuse bony metastatic disease, progressive metastatic bilateral pelvic and inguinal lymphadenopathy, increased size of the left adrenal metastasis, interval nodular densities at the lung bases suspicious for metastases, interval mild right hydronephrosis and hydroureter -02/22/2021 CEA 440 2.  Abdominal pain secondary #1 3.  Constipation 4.  Normocytic anemia 5.  Leukocytosis 6.  Protein calorie malnutrition 7.  Obstructive sleep apnea 8.  Diabetes mellitus 9.  Hypertension 10.  Possible pulmonary hypertension noted on CT chest 11.  Hospital admission 02/20/2021-intractable nausea and vomiting 12.  Right hydronephrosis and hydroureter 13.  Thrombocytopenia 14.  Low-grade fever 15.  E faecalis bacteremia  Ms. Cansler appears unchanged.  She was found to have E faecalis bacteremia this past weekend.  Repeat blood cultures are pending.  ID following.  Bacteremia likely due to cancer in her GI tract.  She has had persistent leukocytosis which is thought to be a leukemoid reaction from her cancer.  Her Port-A-Cath does not appear infected.  Would hold on Port-A-Cath removal unless repeat blood cultures are positive.  Continue supportive care and antibiotics.  Will defer additional work-up to ID.  Her platelet count continues to improve up to 106,000 today.  Pain currently controlled with oxycodone 5 mg every 4 hours as needed.  Ms. Sedberry understands that she is not a candidate for further chemotherapy unless her performance status and nutritional status improve.  Recommendations: 1.  Continue supportive care and antibiotics.  Further work-up of bacteremia per ID. 2.  Monitor CBC 3.  Hold on Port-A-Cath removal unless repeat blood cultures are positive. 4.  Awaiting SNF placement.  Unless her performance status and  nutritional status improved, she is not a candidate for additional chemotherapy.  If these do not improve, we would recommend hospice.  Outpatient follow-up is currently scheduled for later this week.  We will reschedule if she remains in the hospital.  [name], [name], [name], [name]  Ms. Hawbaker was interviewed  and examined.  The fever has resolved.  She is alert and remains mildly confused.  It appears the fever was related to bacteremia.  I suspect a GI source for the bacteremia.  The Port-A-Cath does not appear infected, though this is possible.  I favor completing a course of intravenous antibiotics.  We can consider Port-A-Cath removal for recurrent bacteremia.  The platelet count has improved significantly over the past week.  This may reflect recovery from the last course of chemotherapy or improvement in bacteremia.  She has persistent leukocytosis.  Leukocytosis predated the beginning of chemotherapy November 22 and may reflect a leukemoid reaction from cancer.  She told me she would like to go home when I saw her this morning.  She does not appear to be a candidate for self-care at present.  I agree with skilled nursing facility placement.  I was present for greater than 50% of today's visit.  I performed medical decision making.      SUBJECTIVE: Less confused this morning.  Thinks she is going home today.  No fevers over the past 24 hours.  Oncology History  Gastric cancer ([place])  12/27/2020 Initial Diagnosis   Gastric cancer ([place])   12/27/2020 -  Chemotherapy   Patient is on Treatment Plan : GASTRIC FOLFOX q14d x 12 cycles      PHYSICAL EXAMINATION:  Vitals:   03/02/21 2000 03/03/21 0409  BP: (!) 104/54 109/64  Pulse: (!) 103 95  Resp: 18 18  Temp: 98.4 F (36.9 C) 98.4 F (36.9 C)  SpO2: 100% 100%   Filed Weights   03/01/21 0500 03/02/21 0500 03/03/21 0409  Weight: 115.3 kg 111.9 kg 116.5 kg    Intake/Output from previous day: 01/22 0701 - 01/23  0700 In: 420 [P.O.:120; IV Piggyback:300] Out: 500 [Urine:500] HEENT: No thrush or bleeding Abdomen: Soft, tender in the left lower abdomen Vascular: No leg edema Skin: No petechiae  Port-A-Cath site without erythema  LABORATORY DATA:  I have reviewed the data as listed CMP Latest Ref Rng & Units 03/03/2021 02/28/2021 02/27/2021  Glucose 70 - 99 mg/dL 102(H) 126(H) 117(H)  BUN 6 - 20 mg/dL 31(H) 26(H) 20  Creatinine 0.44 - 1.00 mg/dL 0.95 1.08(H) 0.85  Sodium 135 - 145 mmol/L 130(L) 133(L) 134(L)  Potassium 3.5 - 5.1 mmol/L 4.2 4.2 3.3(L)  Chloride 98 - 111 mmol/L 102 104 104  CO2 22 - 32 mmol/L 20(L) 20(L) 22  Calcium 8.9 - 10.3 mg/dL 8.4(L) 8.7(L) 8.4(L)  Total Protein 6.5 - 8.1 g/dL - - -  Total Bilirubin 0.3 - 1.2 mg/dL - - -  Alkaline Phos 38 - 126 U/L - - -  AST 15 - 41 U/L - - -  ALT 0 - 44 U/L - - -    Lab Results  Component Value Date   WBC 22.2 (H) 03/03/2021   HGB 7.8 (L) 03/03/2021   HCT 24.1 (L) 03/03/2021   MCV 92.7 03/03/2021   PLT 106 (L) 03/03/2021   NEUTROABS 13.5 (H) 02/27/2021    Lab Results  Component Value Date   CEA1 440.0 (H) 02/22/2021   CEA 617.97 (H) 02/11/2021    CT Abdomen Pelvis Wo Contrast  Result Date: 02/20/2021 CLINICAL DATA:  Acute, non localized abdominal pain. Nausea, vomiting, diarrhea and fatigue for the past week. Receiving chemotherapy for gastric and ovarian cancer. EXAM: CT ABDOMEN AND PELVIS WITHOUT CONTRAST TECHNIQUE: Multidetector CT imaging of the abdomen and pelvis was performed following the standard protocol without IV contrast.  RADIATION DOSE REDUCTION: This exam was performed according to the departmental dose-optimization program which includes automated exposure control, adjustment of the mA and/or kV according to patient size and/or use of iterative reconstruction technique. COMPARISON:  Chest CT dated 12/17/2020. Abdomen and pelvis CT dated 12/16/2020. FINDINGS: Lower chest: Mildly enlarged heart. Small amount of  residual atelectasis/scarring in the right lower lobe, including a somewhat oval wedge-shaped area at the posterior right lung base which is significantly smaller. Interval visualization of a 5 mm nodular density at the right lateral costophrenic angle on image number 20/6. There is a similar area more anteriorly on image number 22/6. There is also an interval 11 x 8 mm sub solid nodular density in the right middle lobe on image number 9/6. There is also an interval nodular density in the left lower lobe with eccentric cavitation measuring 11 x 7 mm on image number 8/6. Hepatobiliary: No focal liver abnormality is seen. No gallstones, gallbladder wall thickening, or biliary dilatation. Pancreas: Unremarkable. No pancreatic ductal dilatation or surrounding inflammatory changes. Spleen: Normal in size without focal abnormality. Adrenals/Urinary Tract: An exophytic posterior left adrenal mass is again demonstrated. This measures 3.2 x 2.8 cm on image number 19/2, previously 3.2 x 2.6 cm. Stable normal appearing right adrenal gland. Interval mild dilatation of the right renal collecting system and ureter to the level of the ureterovesical junction with no obstructing stone or mass visualized. Stable normal appearing left kidney and ureter. The urinary bladder is poorly distended with mild to moderate diffuse wall thickening. Stomach/Bowel: A small hiatal hernia is unchanged. Unremarkable small bowel and colon. Normal appearing appendix. Vascular/Lymphatic: Bilateral enlarged pelvic and inguinal lymph nodes with progression. The largest is a left pelvic sidewall node with a short axis diameter of 17 mm on image number 75/2, previously 14 mm. Reproductive: A large left adnexal mass is again demonstrated with an interval increase in size. This measures 13.0 x 8.2 cm on image number 64/2, previously 10.8 x 8.8 cm. A right adnexal mass is again demonstrated, currently measuring 3.4 x 2.7 cm on image number 67/2, previously  2.9 cm in maximum diameter. Normal appearing uterus. Other: Small umbilical and infraumbilical hernias containing fat. Small left inguinal hernia containing fat. Musculoskeletal: Interval diffuse heterogeneous bone sclerosis. IMPRESSION: 1. Increased size of bilateral ovarian masses, especially on the left. 2. Interval diffuse bony metastatic disease. 3. Progressive metastatic bilateral pelvic and inguinal lymphadenopathy. 4. Increased size of a left adrenal metastasis. 5. Interval nodular densities at the lung bases suspicious for metastases. 6. Interval mild right hydronephrosis and hydroureter to the level of the ureterovesical junction. This could be due to recent stone passage, obstruction by wall thickening of the bladder or a nonvisualized obstructing ureteral metastasis. Electronically Signed   By: Claudie Revering M.D.   On: 02/20/2021 18:28   CT HEAD WO CONTRAST (5MM)  Result Date: 02/22/2021 CLINICAL DATA:  Encephalopathy EXAM: CT HEAD WITHOUT CONTRAST TECHNIQUE: Contiguous axial images were obtained from the base of the skull through the vertex without intravenous contrast. RADIATION DOSE REDUCTION: This exam was performed according to the departmental dose-optimization program which includes automated exposure control, adjustment of the mA and/or kV according to patient size and/or use of iterative reconstruction technique. COMPARISON:  None. FINDINGS: Brain: There is no mass, hemorrhage or extra-axial collection. The size and configuration of the ventricles and extra-axial CSF spaces are normal. The brain parenchyma is normal, without acute or chronic infarction. Vascular: No abnormal hyperdensity of the major intracranial arteries or  dural venous sinuses. No intracranial atherosclerosis. Skull: The visualized skull base, calvarium and extracranial soft tissues are normal. Sinuses/Orbits: No fluid levels or advanced mucosal thickening of the visualized paranasal sinuses. No mastoid or middle ear  effusion. The orbits are normal. IMPRESSION: Normal head CT. Electronically Signed   By: Ulyses Jarred M.D.   On: 02/22/2021 22:14   DG CHEST PORT 1 VIEW  Result Date: 02/20/2021 CLINICAL DATA:  History of gastric and ovarian cancer. Fatigue, nausea, vomiting, diarrhea EXAM: PORTABLE CHEST 1 VIEW COMPARISON:  12/31/2020 FINDINGS: Right Port-A-Cath in place with the tip at the cavoatrial junction. Heart is borderline in size. Mild vascular congestion. No confluent airspace opacities, effusions or overt edema. No acute bony abnormality. IMPRESSION: Borderline heart size.  Mild vascular congestion. Electronically Signed   By: Rolm Baptise M.D.   On: 02/20/2021 21:30   ECHOCARDIOGRAM COMPLETE  Result Date: 03/02/2021    ECHOCARDIOGRAM REPORT   Patient Name:   WAI MINOTTI Kurihara Date of Exam: 03/02/2021 Medical Rec #:  [id number]           Height:       64.0 in Accession #:    [id number]          Weight:       246.7 lb Date of Birth:  04/24/1965           BSA:          2.139 m Patient Age:    56 years            BP:           99/59 mmHg Patient Gender: F                   HR:           100 bpm. Exam Location:  Inpatient Procedure: 2D Echo, Cardiac Doppler and Color Doppler Indications:    BACTEREMIA  History:        Patient has no prior history of Echocardiogram examinations.                 Signs/Symptoms:Shortness of Breath and Bacteremia; Risk                 Factors:Hypertension and Diabetes. HLD.  Sonographer:    Beryle Beams Referring Phys: [id number] [place] T VU  Sonographer Comments: Patient is morbidly obese. PATIENT REFUSED SSN/ ALSO HOLDING BREATHE DURING STUDY. IMPRESSIONS  1. Left ventricular ejection fraction, by estimation, is 60 to 65%. The left ventricle has normal function. The left ventricle has no regional wall motion abnormalities. Left ventricular diastolic parameters are consistent with Grade I diastolic dysfunction (impaired relaxation).  2. Right ventricular systolic function is normal. The  right ventricular size is normal. There is mildly elevated pulmonary artery systolic pressure.  3. A small pericardial effusion is present.  4. The mitral valve is normal in structure. No evidence of mitral valve regurgitation. No evidence of mitral stenosis.  5. The aortic valve is tricuspid. There is moderate calcification of the aortic valve. There is mild thickening of the aortic valve. Aortic valve regurgitation is not visualized. No aortic stenosis is present.  6. The inferior vena cava is normal in size with greater than 50% respiratory variability, suggesting right atrial pressure of 3 mmHg. FINDINGS  Left Ventricle: Left ventricular ejection fraction, by estimation, is 60 to 65%. The left ventricle has normal function. The left ventricle has no regional wall motion abnormalities. The left ventricular internal cavity size  was normal in size. There is  no left ventricular hypertrophy. Left ventricular diastolic parameters are consistent with Grade I diastolic dysfunction (impaired relaxation). Normal left ventricular filling pressure. Right Ventricle: The right ventricular size is normal. No increase in right ventricular wall thickness. Right ventricular systolic function is normal. There is mildly elevated pulmonary artery systolic pressure. The tricuspid regurgitant velocity is 3.21  m/s, and with an assumed right atrial pressure of 3 mmHg, the estimated right ventricular systolic pressure is 81.1 mmHg. Left Atrium: Left atrial size was normal in size. Right Atrium: Right atrial size was normal in size. Pericardium: A small pericardial effusion is present. Mitral Valve: The mitral valve is normal in structure. No evidence of mitral valve regurgitation. No evidence of mitral valve stenosis. Tricuspid Valve: The tricuspid valve is normal in structure. Tricuspid valve regurgitation is mild . No evidence of tricuspid stenosis. Aortic Valve: The aortic valve is tricuspid. There is moderate calcification of the  aortic valve. There is mild thickening of the aortic valve. Aortic valve regurgitation is not visualized. No aortic stenosis is present. Aortic valve mean gradient measures 3.5 mmHg. Aortic valve peak gradient measures 7.3 mmHg. Aortic valve area, by VTI measures 2.13 cm. Pulmonic Valve: The pulmonic valve was normal in structure. Pulmonic valve regurgitation is mild. No evidence of pulmonic stenosis. Aorta: The aortic root is normal in size and structure. Venous: The inferior vena cava is normal in size with greater than 50% respiratory variability, suggesting right atrial pressure of 3 mmHg. IAS/Shunts: No atrial level shunt detected by color flow Doppler.  LEFT VENTRICLE PLAX 2D LVIDd:         4.40 cm     Diastology LVIDs:         2.30 cm     LV e' medial:    9.79 cm/s LV PW:         1.10 cm     LV E/e' medial:  7.7 LV IVS:        0.90 cm     LV e' lateral:   12.60 cm/s LVOT diam:     2.00 cm     LV E/e' lateral: 6.0 LV SV:         42 LV SV Index:   20 LVOT Area:     3.14 cm  LV Volumes (MOD) LV vol d, MOD A2C: 50.2 ml LV vol d, MOD A4C: 38.9 ml LV vol s, MOD A2C: 8.5 ml LV vol s, MOD A4C: 9.6 ml LV SV MOD A2C:     41.7 ml LV SV MOD A4C:     38.9 ml LV SV MOD BP:      36.7 ml RIGHT VENTRICLE             IVC RV S prime:     17.90 cm/s  IVC diam: 2.00 cm TAPSE (M-mode): 2.4 cm LEFT ATRIUM             Index        RIGHT ATRIUM           Index LA diam:        4.30 cm 2.01 cm/m   RA Area:     16.70 cm LA Vol (A2C):   44.6 ml 20.85 ml/m  RA Volume:   48.50 ml  22.68 ml/m LA Vol (A4C):   49.8 ml 23.28 ml/m LA Biplane Vol: 48.1 ml 22.49 ml/m  AORTIC VALVE  PULMONIC VALVE AV Area (Vmax):    1.98 cm     PV Vmax:       0.89 m/s AV Area (Vmean):   1.98 cm     PV Vmean:      55.000 cm/s AV Area (VTI):     2.13 cm     PV VTI:        0.118 m AV Vmax:           135.50 cm/s  PV Peak grad:  3.1 mmHg AV Vmean:          88.550 cm/s  PV Mean grad:  1.0 mmHg AV VTI:            0.196 m AV Peak Grad:       7.3 mmHg AV Mean Grad:      3.5 mmHg LVOT Vmax:         85.25 cm/s LVOT Vmean:        55.800 cm/s LVOT VTI:          0.133 m LVOT/AV VTI ratio: 0.68  AORTA Ao Root diam: 2.90 cm Ao Asc diam:  3.10 cm MITRAL VALVE                TRICUSPID VALVE MV Area (PHT): 3.11 cm     TV Peak grad:   33.4 mmHg MV Decel Time: 244 msec     TV Mean grad:   22.0 mmHg MV E velocity: 75.30 cm/s   TV Vmax:        2.89 m/s MV A velocity: 122.00 cm/s  TV Vmean:       224.0 cm/s MV E/A ratio:  0.62         TV VTI:         0.78 msec                             TR Peak grad:   41.2 mmHg                             TR Vmax:        321.00 cm/s                              SHUNTS                             Systemic VTI:  0.13 m                             Systemic Diam: 2.00 cm Skeet Latch MD Electronically signed by Skeet Latch MD Signature Date/Time: 03/02/2021/3:52:35 PM    Final      Future Appointments  Date Time Provider [place]  03/06/2021  1:00 PM [facility] PHLEBOTOMIST [facility] None  03/06/2021  1:15 PM [facility] FLUSH ROOM [facility] None  03/06/2021  1:45 PM Owens Shark, NP [facility] None       LOS: 11 days

## 2021-03-03 NOTE — Progress Notes (Signed)
[facility]   Reason for visit: Follow up on bacteremia  Interval History: repeat blood cultures sent yesterday and remain ngtd, fever curve has trended down; WBC up to 22.2.  no complaints today.  Day 2 antibiotics  Physical Exam: Constitutional:  Vitals:   03/03/21 0409 03/03/21 0900  BP: 109/64 111/71  Pulse: 95 (!) 104  Resp: 18 18  Temp: 98.4 F (36.9 C) 98.6 F (37 C)  SpO2: 100% 98%   patient appears in NAD Respiratory: Normal respiratory effort; CTA B Cardiovascular: RRR GI: soft, nt, nd  Review of Systems: Constitutional: negative for fevers and chills Gastrointestinal: negative for nausea and diarrhea  Lab Results  Component Value Date   WBC 22.2 (H) 03/03/2021   HGB 7.8 (L) 03/03/2021   HCT 24.1 (L) 03/03/2021   MCV 92.7 03/03/2021   PLT 106 (L) 03/03/2021    Lab Results  Component Value Date   CREATININE 0.95 03/03/2021   BUN 31 (H) 03/03/2021   NA 130 (L) 03/03/2021   K 4.2 03/03/2021   CL 102 03/03/2021   CO2 20 (L) 03/03/2021    Lab Results  Component Value Date   ALT 6 02/22/2021   AST 19 02/22/2021   ALKPHOS 227 (H) 02/22/2021     Microbiology: Recent Results (from the past 240 hour(s))  Urine Culture     Status: Abnormal   Collection Time: 02/24/21  9:49 AM   Specimen: Urine, Clean Catch  Result Value Ref Range Status   Specimen Description   Final    URINE, CLEAN CATCH Performed at [facility], [place] [address] [place], [place] [address]    Special Requests   Final    NONE Performed at [facility], [place] [address] [place], [place] [address]    Culture (A)  Final    <10,000 COLONIES/mL INSIGNIFICANT GROWTH Performed at [facility] [address], [place], [place] [address]    Report Status 02/26/2021 FINAL  Final  Culture, blood (routine x 2)     Status: Abnormal (Preliminary result)   Collection Time: 03/01/21 11:03 AM   Specimen: BLOOD  Result  Value Ref Range Status   Specimen Description   Final    BLOOD RIGHT ANTECUBITAL Performed at [facility] [address] [place], [place] [address]    Special Requests   Final    BOTTLES DRAWN AEROBIC AND ANAEROBIC Blood Culture adequate volume Performed at [facility] [address], [place], [place] [address]    Culture  Setup Time   Final    GRAM POSITIVE COCCI IN BOTH AEROBIC AND ANAEROBIC BOTTLES CRITICAL RESULT CALLED TO, READ BACK BY AND VERIFIED WITH: M LILLISTON,PHARMD@[id number]  03/02/21 [place]    Culture (A)  Final    ENTEROCOCCUS FAECALIS SUSCEPTIBILITIES TO FOLLOW Performed at [facility], [place] [address], [place], [place] [address]    Report Status PENDING  Incomplete  Culture, blood (routine x 2)     Status: Abnormal (Preliminary result)   Collection Time: 03/01/21 11:03 AM   Specimen: BLOOD  Result Value Ref Range Status   Specimen Description   Final    BLOOD BLOOD LEFT HAND Performed at [facility] [address], [place], [place] [address]    Special Requests   Final    BOTTLES DRAWN AEROBIC AND ANAEROBIC Blood Culture adequate volume Performed at [facility] [place] [address] [place],  [place] [address]    Culture  Setup Time   Final    GRAM POSITIVE COCCI IN BOTH AEROBIC AND ANAEROBIC BOTTLES CRITICAL VALUE NOTED.  VALUE IS CONSISTENT WITH PREVIOUSLY REPORTED AND CALLED VALUE. Performed at [facility], [place] [address] [place], [place] [address]    Culture ENTEROCOCCUS FAECALIS (A)  Final   Report Status PENDING  Incomplete  Blood Culture ID Panel (Reflexed)     Status: Abnormal   Collection Time: 03/01/21 11:03 AM  Result Value Ref Range Status   Enterococcus faecalis DETECTED (A) NOT DETECTED Final    Comment: CRITICAL RESULT CALLED TO, READ BACK BY AND VERIFIED WITH: M LILLISTON,[email or web address]@[id number]  03/02/21 [place]    Enterococcus Faecium NOT DETECTED NOT DETECTED Final    Listeria monocytogenes NOT DETECTED NOT DETECTED Final   Staphylococcus species NOT DETECTED NOT DETECTED Final   Staphylococcus aureus (BCID) NOT DETECTED NOT DETECTED Final   Staphylococcus epidermidis NOT DETECTED NOT DETECTED Final   Staphylococcus lugdunensis NOT DETECTED NOT DETECTED Final   Streptococcus species NOT DETECTED NOT DETECTED Final   Streptococcus agalactiae NOT DETECTED NOT DETECTED Final   Streptococcus pneumoniae NOT DETECTED NOT DETECTED Final   Streptococcus pyogenes NOT DETECTED NOT DETECTED Final   A.calcoaceticus-baumannii NOT DETECTED NOT DETECTED Final   Bacteroides fragilis NOT DETECTED NOT DETECTED Final   Enterobacterales NOT DETECTED NOT DETECTED Final   Enterobacter cloacae complex NOT DETECTED NOT DETECTED Final   Escherichia coli NOT DETECTED NOT DETECTED Final   Klebsiella aerogenes NOT DETECTED NOT DETECTED Final   Klebsiella oxytoca NOT DETECTED NOT DETECTED Final   Klebsiella pneumoniae NOT DETECTED NOT DETECTED Final   Proteus species NOT DETECTED NOT DETECTED Final   Salmonella species NOT DETECTED NOT DETECTED Final   Serratia marcescens NOT DETECTED NOT DETECTED Final   Haemophilus influenzae NOT DETECTED NOT DETECTED Final   Neisseria meningitidis NOT DETECTED NOT DETECTED Final   Pseudomonas aeruginosa NOT DETECTED NOT DETECTED Final   Stenotrophomonas maltophilia NOT DETECTED NOT DETECTED Final   Candida albicans NOT DETECTED NOT DETECTED Final   Candida auris NOT DETECTED NOT DETECTED Final   Candida glabrata NOT DETECTED NOT DETECTED Final   Candida krusei NOT DETECTED NOT DETECTED Final   Candida parapsilosis NOT DETECTED NOT DETECTED Final   Candida tropicalis NOT DETECTED NOT DETECTED Final   Cryptococcus neoformans/gattii NOT DETECTED NOT DETECTED Final   Vancomycin resistance NOT DETECTED NOT DETECTED Final    Comment: Performed at [facility], [address], [place], [place] [address]  Culture, blood (routine x 2)      Status: None (Preliminary result)   Collection Time: 03/02/21  4:18 PM   Specimen: BLOOD  Result Value Ref Range Status   Specimen Description   Final    BLOOD LEFT ANTECUBITAL Performed at [facility] [address], [place], [place] [address]    Special Requests   Final    BOTTLES DRAWN AEROBIC ONLY Blood Culture adequate volume Performed at [facility] [address], [place], [place] [address]    Culture   Final    NO GROWTH < 12 HOURS Performed at [facility] [address] [place],  [address]    Report Status PENDING  Incomplete  Culture, blood (routine x 2)     Status: None (Preliminary result)   Collection Time: 03/02/21  4:18 PM   Specimen: BLOOD  Result Value Ref Range Status   Specimen Description   Final  BLOOD LEFT ANTECUBITAL Performed at [facility] [address] [place], [place] [address]    Special Requests   Final    BOTTLES DRAWN AEROBIC AND ANAEROBIC Blood Culture adequate volume Performed at [facility] [address], [place], [place] [address]    Culture   Final    NO GROWTH < 12 HOURS Performed at [facility] [address] [place],  [address]    Report Status PENDING  Incomplete    Impression/Plan:  1. Enterococcal bacteremia - she has been feeling poorly and now with 4/4 blood cultures with Enterococcus.  I suspect this is a GI source but endocarditis remains a possibility.  Repeat blood cultures sent.   Further work up depends on how aggressive of care is indicated.  No obvious vegetation noted on TTE.  If more aggressive care desired, she will need a TEE to determine presence of vegetation or not to help decide on a duration and if ceftriaxone will need to be added.    2.  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  For #1, removal of the  port-a-cath would be recommended but I agree, at this time if continuation of treatment is not indicated, ok from ID standpoint to leave it in for now, pending repeat blood cultures.    3. Leukocytosis - likely related to bacteremia and infection.  A bit elevated today compared to yesterday.  Will continue to monitor.

## 2021-03-03 NOTE — Assessment & Plan Note (Addendum)
S/p Infectious disease consulted. Likely GI source. Concern for possible endocarditis. Transthoracic Echocardiogram without evidence of vegetation. Repeat blood cultures (1/22) are no growth to date ID recommendations (1/24 sign off note and OPAT orders 1/25): Ampicillin IV x 4 weeks.  Consider rechecking TTE and repeat blood cultures based on performance status and her candidacy for chemotherapy in the future.  Needs TEE and repeat cultures to assure clearance prior to restarting chemo to ensure port is not infected and no concerns for endocarditis.  Follow up with ID clinic if improvement in functional status. Fever 1/26, follow repeat cultures NGx4 - no recurrent fevers - follow

## 2021-03-03 NOTE — Progress Notes (Signed)
PROGRESS NOTE    Erin [name]  OEU:[id number] DOB: [birth year] DOA: 02/20/2021 PCP: Merryl Hacker, No   Brief Narrative: Erin [name] is a 56 y.o. 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 Patient presented secondary to nausea/vomiting/diarrhea and abdominal pain. Hospitalization complicated by recurrent anemia. Fecal occult blood test negative. Patient has received 5 units of PRBC to date. Platelets improving. Now found to have E. Faecalis bacteremia.   Assessment & Plan:   * Severe anemia- (present on admission) Secondary to chemotherapy. Hemoglobin of 6.0 on admission. Patient has received 5 units of PRBC to date. Most recent rebound hemoglobin of 8.6 with drift down to 7.8 today -CBC in AM  Severe sepsis [facility]) Not present on admission. Unsure of source. Port site does not appear infected. Associated leukocytosis, fevers, hypotension. Blood cultures (1/21) positive for GPCs with BCID suggesting enterococcus faecalis. Started on ampicillin IV. -See problem, Bacteremia due to Enterococcus  Thrombocytopenia ([place]) Related to chemotherapy. Improving.  Hypokalemia- (present on admission) -Potassium supplementation as needed  Essential hypertension- (present on admission) Patient is on hydrochlorothiazide, Coreg and amlodipine as an outpatient. Coreg and amlodipine continued. Blood pressure mostly well controlled but with hypotension in setting of worsened anemia and sepsis. -Continue Coreg  Hypotension-resolved as of 03/03/2021 Likely related to combination of anemia, sepsis and iatrogenic from antihypertensives.  Given a 500 mL NS bolus. Resolved.  Somnolence Oxycodone reduced. Possibly some effect from underlying bacteremia. Somnolence is improved.  Leukocytosis Likely secondary to bacteremia/infection. Initial improvement, but now trending back  up -CBC in AM -Treat infection  Protein-calorie malnutrition, severe Patient continues to have poor oral intake which may affect her candidacy for future treatment of cancer.  Dietitian recommendations: Continue Boost Breeze TID and Prosource Plus BID. Continue to provide encouragement with eating and drinking as able.   Cancer related pain- (present on admission) -Continue Oxycodone prn  Metastatic adenocarcinoma of unknown origin [facility])- (present on admission) Patient follows with Dr. Benay Spice and is receiving chemotherapy. Recent imaging significant for progression of disease. Palliative care consulted (1/14) and decision made at that time was for full code/full scop treatment.  Diabetes mellitus ([place]) Hemoglobin A1C of 7.1 -Continue SSI  Bacteremia due to Enterococcus Infectious disease consulted. Likely GI source. Concern for possible endocarditis. Transthoracic Echocardiogram without evidence of vegetation. -ID recommendations: Ampicillin IV -Follow-up repeat blood cultures (1/22)     DVT prophylaxis: SCDs Code Status:   Code Status: Full Code Family Communication: None at bedside Disposition Plan: Discharge to SNF likely in 3+ days pending stable hemoglobin/WBC, blood pressure and outpatient blood culture regimen   Consultants:  Medical oncology Palliative care medicine  Procedures:  None  Antimicrobials: Ampicillin IV    Subjective: No concerns. Patient states she will try to eat more.  Objective: Vitals:   03/02/21 2000 03/03/21 0409 03/03/21 0900 03/03/21 1425  BP: (!) 104/54 109/64 111/71 113/72  Pulse: (!) 103 95 (!) 104 (!) 105  Resp: 18 18 18 18   Temp: 98.4 F (36.9 C) 98.4 F (36.9 C) 98.6 F (37 C) 97.7 F (36.5 C)  TempSrc: Oral Axillary Oral Oral  SpO2: 100% 100% 98% 98%  Weight:  116.5 kg    Height:        Intake/Output Summary (Last 24 hours) at 03/03/2021 1553 Last data filed at 03/03/2021 1440 Gross per 24 hour  Intake 1180 ml   Output 500 ml  Net 680 ml  Filed Weights   03/01/21 0500 03/02/21 0500 03/03/21 0409  Weight: 115.3 kg 111.9 kg 116.5 kg    Examination:  General exam: Appears calm and comfortable Respiratory system: Clear to auscultation. Respiratory effort normal. Cardiovascular system: S1 & S2 heard, RRR. Gastrointestinal system: Abdomen is non-distended, soft and mild-moderately tender. Normal bowel sounds heard. Central nervous system: Alert and oriented. No focal neurological deficits. Musculoskeletal: No edema. No calf tenderness Skin: No cyanosis. No rashes Psychiatry: Judgement and insight appear normal. Blunt affect.     Data Reviewed: I have personally reviewed following labs and imaging studies  CBC Lab Results  Component Value Date   WBC 22.2 (H) 03/03/2021   RBC 2.60 (L) 03/03/2021   HGB 7.8 (L) 03/03/2021   HCT 24.1 (L) 03/03/2021   MCV 92.7 03/03/2021   MCH 30.0 03/03/2021   PLT 106 (L) 03/03/2021   MCHC 32.4 03/03/2021   RDW 17.2 (H) 03/03/2021   LYMPHSABS 1.1 02/27/2021   MONOABS 0.9 02/27/2021   EOSABS 0.0 02/27/2021   BASOSABS 0.0 35/32/9924     Last metabolic panel Lab Results  Component Value Date   NA 130 (L) 03/03/2021   K 4.2 03/03/2021   CL 102 03/03/2021   CO2 20 (L) 03/03/2021   BUN 31 (H) 03/03/2021   CREATININE 0.95 03/03/2021   GLUCOSE 102 (H) 03/03/2021   GFRNONAA >60 03/03/2021   GFRAA 78 03/08/2020   CALCIUM 8.4 (L) 03/03/2021   PHOS 2.6 02/22/2021   PROT 5.8 (L) 02/22/2021   ALBUMIN 1.9 (L) 02/22/2021   LABGLOB 3.0 03/08/2020   AGRATIO 1.3 03/08/2020   BILITOT 1.5 (H) 02/22/2021   ALKPHOS 227 (H) 02/22/2021   AST 19 02/22/2021   ALT 6 02/22/2021   ANIONGAP 8 03/03/2021    CBG (last 3)  Recent Labs    03/02/21 2115 03/03/21 0740 03/03/21 1114  GLUCAP 97 93 88      GFR: Estimated Creatinine Clearance: 83.9 mL/min (by C-G formula based on SCr of 0.95 mg/dL).  Coagulation Profile: Recent Labs  Lab 02/27/21 0321   INR 1.6*     Recent Results (from the past 240 hour(s))  Urine Culture     Status: Abnormal   Collection Time: 02/24/21  9:49 AM   Specimen: Urine, Clean Catch  Result Value Ref Range Status   Specimen Description   Final    URINE, CLEAN CATCH Performed at [facility], [place] [address], [place], [place] [address]    Special Requests   Final    NONE Performed at [facility], [place] [address], [place], [place] [address]    Culture (A)  Final    <10,000 COLONIES/mL INSIGNIFICANT GROWTH Performed at [place] [address] [place], [place] [address]    Report Status 02/26/2021 FINAL  Final  Culture, blood (routine x 2)     Status: Abnormal (Preliminary result)   Collection Time: 03/01/21 11:03 AM   Specimen: BLOOD  Result Value Ref Range Status   Specimen Description   Final    BLOOD RIGHT ANTECUBITAL Performed at [place] [address] [place], [place] [address]    Special Requests   Final    BOTTLES DRAWN AEROBIC AND ANAEROBIC Blood Culture adequate volume Performed at [place] [address], [place], [place] [address]    Culture  Setup Time   Final    GRAM POSITIVE COCCI IN BOTH AEROBIC AND ANAEROBIC BOTTLES CRITICAL RESULT CALLED  TO, READ BACK BY AND VERIFIED WITH: M LILLISTON,[email or web address]@[id number]  03/02/21 [place]    Culture (A)  Final    ENTEROCOCCUS FAECALIS SUSCEPTIBILITIES TO FOLLOW Performed at [facility], [place] [address] [place], [place] [address]    Report Status PENDING  Incomplete  Culture, blood (routine x 2)     Status: Abnormal (Preliminary result)   Collection Time: 03/01/21 11:03 AM   Specimen: BLOOD  Result Value Ref Range Status   Specimen Description   Final    BLOOD BLOOD LEFT HAND Performed at [place] [address], [place], [place] [address]    Special Requests   Final    BOTTLES DRAWN AEROBIC AND ANAEROBIC Blood Culture  adequate volume Performed at [place] [address], [place], [place] [address]    Culture  Setup Time   Final    GRAM POSITIVE COCCI IN BOTH AEROBIC AND ANAEROBIC BOTTLES CRITICAL VALUE NOTED.  VALUE IS CONSISTENT WITH PREVIOUSLY REPORTED AND CALLED VALUE. Performed at [facility], [place] [address] [place], [place] [address]    Culture ENTEROCOCCUS FAECALIS (A)  Final   Report Status PENDING  Incomplete  Blood Culture ID Panel (Reflexed)     Status: Abnormal   Collection Time: 03/01/21 11:03 AM  Result Value Ref Range Status   Enterococcus faecalis DETECTED (A) NOT DETECTED Final    Comment: CRITICAL RESULT CALLED TO, READ BACK BY AND VERIFIED WITH: M LILLISTON,PHARMD@0556  03/02/21 [place]    Enterococcus Faecium NOT DETECTED NOT DETECTED Final   Listeria monocytogenes NOT DETECTED NOT DETECTED Final   Staphylococcus species NOT DETECTED NOT DETECTED Final   Staphylococcus aureus (BCID) NOT DETECTED NOT DETECTED Final   Staphylococcus epidermidis NOT DETECTED NOT DETECTED Final   Staphylococcus lugdunensis NOT DETECTED NOT DETECTED Final   Streptococcus species NOT DETECTED NOT DETECTED Final   Streptococcus agalactiae NOT DETECTED NOT DETECTED Final   Streptococcus pneumoniae NOT DETECTED NOT DETECTED Final   Streptococcus pyogenes NOT DETECTED NOT DETECTED Final   A.calcoaceticus-baumannii NOT DETECTED NOT DETECTED Final   Bacteroides fragilis NOT DETECTED NOT DETECTED Final   Enterobacterales NOT DETECTED NOT DETECTED Final   Enterobacter cloacae complex NOT DETECTED NOT DETECTED Final   Escherichia coli NOT DETECTED NOT DETECTED Final   Klebsiella aerogenes NOT DETECTED NOT DETECTED Final   Klebsiella oxytoca NOT DETECTED NOT DETECTED Final   Klebsiella pneumoniae NOT DETECTED NOT DETECTED Final   Proteus species NOT DETECTED NOT DETECTED Final   Salmonella species NOT DETECTED NOT DETECTED Final   Serratia marcescens NOT DETECTED NOT DETECTED  Final   Haemophilus influenzae NOT DETECTED NOT DETECTED Final   Neisseria meningitidis NOT DETECTED NOT DETECTED Final   Pseudomonas aeruginosa NOT DETECTED NOT DETECTED Final   Stenotrophomonas maltophilia NOT DETECTED NOT DETECTED Final   Candida albicans NOT DETECTED NOT DETECTED Final   Candida auris NOT DETECTED NOT DETECTED Final   Candida glabrata NOT DETECTED NOT DETECTED Final   Candida krusei NOT DETECTED NOT DETECTED Final   Candida parapsilosis NOT DETECTED NOT DETECTED Final   Candida tropicalis NOT DETECTED NOT DETECTED Final   Cryptococcus neoformans/gattii NOT DETECTED NOT DETECTED Final   Vancomycin resistance NOT DETECTED NOT DETECTED Final    Comment: Performed at [facility], [address] [place], [place] [address]  Culture, blood (routine x 2)     Status: None (Preliminary result)   Collection Time: 03/02/21  4:18 PM   Specimen: BLOOD  Result Value  Ref Range Status   Specimen Description   Final    BLOOD LEFT ANTECUBITAL Performed at [place] [address] [place], [place] [address]    Special Requests   Final    BOTTLES DRAWN AEROBIC ONLY Blood Culture adequate volume Performed at [place] [address], [place], [place] [address]    Culture   Final    NO GROWTH < 12 HOURS Performed at [place] [address] [place], [place] [address]    Report Status PENDING  Incomplete  Culture, blood (routine x 2)     Status: None (Preliminary result)   Collection Time: 03/02/21  4:18 PM   Specimen: BLOOD  Result Value Ref Range Status   Specimen Description   Final    BLOOD LEFT ANTECUBITAL Performed at [place] [address] [place], [place] [address]    Special Requests   Final    BOTTLES DRAWN AEROBIC AND ANAEROBIC Blood Culture adequate volume Performed at [place] [address] [place], [place] [address]    Culture   Final    NO  GROWTH < 12 HOURS Performed at [place] [address] [place], [place] [address]    Report Status PENDING  Incomplete         Radiology Studies: ECHOCARDIOGRAM COMPLETE  Result Date: 03/02/2021    ECHOCARDIOGRAM REPORT   Patient Name:   Erin [name] [place]arn[place] Date of Exam: 03/02/2021 Medical Rec #:  [id number]           Height:       64.0 in Accession #:    [id number]          Weight:       246.7 lb Date of Birth:  1965/12/29           BSA:          2.139 m Patient Age:    89 years            BP:           99/59 mmHg Patient Gender: F                   HR:           100 bpm. Exam Location:  Inpatient Procedure: 2D Echo, Cardiac Doppler and Color Doppler Indications:    BACTEREMIA  History:        Patient has no prior history of Echocardiogram examinations.                 Signs/Symptoms:Shortness of Breath and Bacteremia; Risk                 Factors:Hypertension and Diabetes. HLD.  Sonographer:    Beryle Beams Referring Phys: [id number] [place] T VU  Sonographer Comments: Patient is morbidly obese. PATIENT REFUSED SSN/ ALSO HOLDING BREATHE DURING STUDY. IMPRESSIONS  1. Left ventricular ejection fraction, by estimation, is 60 to 65%. The left ventricle has normal function. The left ventricle has no regional wall motion abnormalities. Left ventricular diastolic parameters are consistent with Grade I diastolic dysfunction (impaired relaxation).  2. Right ventricular systolic function is normal. The right ventricular size is normal. There is mildly elevated pulmonary artery systolic pressure.  3. A small pericardial effusion is present.  4. The mitral valve is normal in structure. No evidence of mitral valve regurgitation. No evidence of mitral stenosis.  5. The aortic valve is  tricuspid. There is moderate calcification of the aortic valve. There is mild thickening of the aortic valve. Aortic valve regurgitation is not visualized. No aortic stenosis is present.  6. The inferior vena cava is normal in  size with greater than 50% respiratory variability, suggesting right atrial pressure of 3 mmHg. FINDINGS  Left Ventricle: Left ventricular ejection fraction, by estimation, is 60 to 65%. The left ventricle has normal function. The left ventricle has no regional wall motion abnormalities. The left ventricular internal cavity size was normal in size. There is  no left ventricular hypertrophy. Left ventricular diastolic parameters are consistent with Grade I diastolic dysfunction (impaired relaxation). Normal left ventricular filling pressure. Right Ventricle: The right ventricular size is normal. No increase in right ventricular wall thickness. Right ventricular systolic function is normal. There is mildly elevated pulmonary artery systolic pressure. The tricuspid regurgitant velocity is 3.21  m/s, and with an assumed right atrial pressure of 3 mmHg, the estimated right ventricular systolic pressure is 31.4 mmHg. Left Atrium: Left atrial size was normal in size. Right Atrium: Right atrial size was normal in size. Pericardium: A small pericardial effusion is present. Mitral Valve: The mitral valve is normal in structure. No evidence of mitral valve regurgitation. No evidence of mitral valve stenosis. Tricuspid Valve: The tricuspid valve is normal in structure. Tricuspid valve regurgitation is mild . No evidence of tricuspid stenosis. Aortic Valve: The aortic valve is tricuspid. There is moderate calcification of the aortic valve. There is mild thickening of the aortic valve. Aortic valve regurgitation is not visualized. No aortic stenosis is present. Aortic valve mean gradient measures 3.5 mmHg. Aortic valve peak gradient measures 7.3 mmHg. Aortic valve area, by VTI measures 2.13 cm. Pulmonic Valve: The pulmonic valve was normal in structure. Pulmonic valve regurgitation is mild. No evidence of pulmonic stenosis. Aorta: The aortic root is normal in size and structure. Venous: The inferior vena cava is normal in size  with greater than 50% respiratory variability, suggesting right atrial pressure of 3 mmHg. IAS/Shunts: No atrial level shunt detected by color flow Doppler.  LEFT VENTRICLE PLAX 2D LVIDd:         4.40 cm     Diastology LVIDs:         2.30 cm     LV e' medial:    9.79 cm/s LV PW:         1.10 cm     LV E/e' medial:  7.7 LV IVS:        0.90 cm     LV e' lateral:   12.60 cm/s LVOT diam:     2.00 cm     LV E/e' lateral: 6.0 LV SV:         42 LV SV Index:   20 LVOT Area:     3.14 cm  LV Volumes (MOD) LV vol d, MOD A2C: 50.2 ml LV vol d, MOD A4C: 38.9 ml LV vol s, MOD A2C: 8.5 ml LV vol s, MOD A4C: 9.6 ml LV SV MOD A2C:     41.7 ml LV SV MOD A4C:     38.9 ml LV SV MOD BP:      36.7 ml RIGHT VENTRICLE             IVC RV S prime:     17.90 cm/s  IVC diam: 2.00 cm TAPSE (M-mode): 2.4 cm LEFT ATRIUM             Index  RIGHT ATRIUM           Index LA diam:        4.30 cm 2.01 cm/m   RA Area:     16.70 cm LA Vol (A2C):   44.6 ml 20.85 ml/m  RA Volume:   48.50 ml  22.68 ml/m LA Vol (A4C):   49.8 ml 23.28 ml/m LA Biplane Vol: 48.1 ml 22.49 ml/m  AORTIC VALVE                    PULMONIC VALVE AV Area (Vmax):    1.98 cm     PV Vmax:       0.89 m/s AV Area (Vmean):   1.98 cm     PV Vmean:      55.000 cm/s AV Area (VTI):     2.13 cm     PV VTI:        0.118 m AV Vmax:           135.50 cm/s  PV Peak grad:  3.1 mmHg AV Vmean:          88.550 cm/s  PV Mean grad:  1.0 mmHg AV VTI:            0.196 m AV Peak Grad:      7.3 mmHg AV Mean Grad:      3.5 mmHg LVOT Vmax:         85.25 cm/s LVOT Vmean:        55.800 cm/s LVOT VTI:          0.133 m LVOT/AV VTI ratio: 0.68  AORTA Ao Root diam: 2.90 cm Ao Asc diam:  3.10 cm MITRAL VALVE                TRICUSPID VALVE MV Area (PHT): 3.11 cm     TV Peak grad:   33.4 mmHg MV Decel Time: 244 msec     TV Mean grad:   22.0 mmHg MV E velocity: 75.30 cm/s   TV Vmax:        2.89 m/s MV A velocity: 122.00 cm/s  TV Vmean:       224.0 cm/s MV E/A ratio:  0.62         TV VTI:         0.78 msec                              TR Peak grad:   41.2 mmHg                             TR Vmax:        321.00 cm/s                              SHUNTS                             Systemic VTI:  0.13 m                             Systemic Diam: 2.00 cm Skeet Latch MD Electronically signed by Skeet Latch MD Signature Date/Time: 03/02/2021/3:52:35 PM    Final         Scheduled Meds:  (feeding supplement)  PROSource Plus  30 mL Oral BID BM   carvedilol  6.25 mg Oral BID WC   Chlorhexidine Gluconate Cloth  6 each Topical Daily   feeding supplement  1 Container Oral BID BM   feeding supplement  237 mL Oral Q24H   insulin aspart  0-15 Units Subcutaneous TID WC   insulin aspart  0-5 Units Subcutaneous QHS   mouth rinse  15 mL Mouth Rinse BID   multivitamin with minerals  1 tablet Oral Daily   pantoprazole  40 mg Oral Daily   Continuous Infusions:  ampicillin (OMNIPEN) IV 2 g (03/03/21 1234)     LOS: 11 days     Cordelia Poche, MD [facility] 03/03/2021, 3:53 PM  If 7PM-7AM, please contact night-coverage [email or web address]

## 2021-03-03 NOTE — NC FL2 (Signed)
[place] LEVEL OF CARE SCREENING TOOL     IDENTIFICATION  Patient Name: Erin [name] Birthdate: 1965-07-06 Sex: female Admission Date (Current Location): 02/20/2021  [facility] and [place] Number:  Herbalist and Address:  [facility],  [place], [place]      Provider Number: [id number]  Attending Physician Name and Address:  Mariel Aloe, MD  Relative Name and Phone Number:       Current Level of Care: Hospital Recommended Level of Care: [place] Prior Approval Number:    Date Approved/Denied:   PASRR Number: [id number]  Discharge Plan: SNF    Current Diagnoses: Patient Active Problem List   Diagnosis Date Noted   Bacteremia    Hypotension 03/01/2021   Leukocytosis 02/28/2021   Somnolence 02/28/2021   Thrombocytopenia ([place]) 02/26/2021   Protein-calorie malnutrition, severe 02/22/2021   Severe anemia 02/20/2021   Anemia 12/31/2020   Severe sepsis ([place]) 12/30/2020   Panic attack 12/29/2020   Dysuria 12/28/2020   Gastric cancer ([place]) 12/27/2020   Cancer related pain 12/27/2020   Gastritis and gastroduodenitis    Adenocarcinoma ([place]) 12/20/2020   Metastatic adenocarcinoma of unknown origin ([place])    Malnutrition of moderate degree 12/18/2020   Elevated CEA 12/18/2020   Mass of urethra    Abdominal pain 12/16/2020   Ovarian mass 12/16/2020   Hypokalemia 12/16/2020   Tear of right rotator cuff 04/04/2020   Vitamin D deficiency 04/02/2020   Other fatigue 03/13/2020   Shortness of breath on exertion 03/13/2020   OSA (obstructive sleep apnea) 11/25/2017   Obesity hypoventilation syndrome ([place]) 11/25/2017   Mallet finger of right hand 09/03/2017   Anterolisthesis 04/17/2017   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   Degenerative disc disease, lumbar 04/17/2017   Class 3 severe obesity with serious comorbidity and body mass index (BMI) of 50.0 to 59.9 in adult [facility]) 06/01/2016    Diabetes mellitus ([place]) 05/27/2016   Essential hypertension 04/13/2016   Menorrhagia 08/26/2015    Orientation RESPIRATION BLADDER Height & Weight     Self, Time, Situation, Place  Normal Continent Weight: 116.5 kg Height:  5\' 4"  (162.6 cm)  BEHAVIORAL SYMPTOMS/MOOD NEUROLOGICAL BOWEL NUTRITION STATUS      Continent Diet (regular)  AMBULATORY STATUS COMMUNICATION OF NEEDS Skin   Extensive Assist Verbally Normal                       Personal Care Assistance Level of Assistance  Bathing, Feeding, Dressing Bathing Assistance: Limited assistance Feeding assistance: Limited assistance Dressing Assistance: Limited assistance     Functional Limitations Info  Sight, Hearing, Speech Sight Info: Adequate Hearing Info: Adequate Speech Info: Adequate    SPECIAL CARE FACTORS FREQUENCY  PT (By licensed PT), OT (By licensed OT)     PT Frequency: 5 x weekly OT Frequency: 5 x weekly            Contractures Contractures Info: Not present    Additional Factors Info  Code Status Code Status Info: full             Current Medications (03/03/2021):  This is the current hospital active medication list Current Facility-Administered Medications  Medication Dose Route Frequency Provider Last Rate Last Admin   (feeding supplement) PROSource Plus liquid 30 mL  30 mL Oral BID BM Gonfa, Taye T, MD   30 mL at 03/02/21 1110   acetaminophen (TYLENOL) tablet 650 mg  650 mg Oral Q6H  PRN Marcelyn Bruins, MD   650 mg at 03/02/21 6754   Or   acetaminophen (TYLENOL) suppository 650 mg  650 mg Rectal Q6H PRN Marcelyn Bruins, MD   650 mg at 02/21/21 0030   ampicillin (OMNIPEN) 2 g in sodium chloride 0.9 % 100 mL IVPB  2 g Intravenous Q4H Mariel Aloe, MD 300 mL/hr at 03/03/21 0354 2 g at 03/03/21 0354   carvedilol (COREG) tablet 6.25 mg  6.25 mg Oral BID WC Mariel Aloe, MD   6.25 mg at 03/02/21 1742   Chlorhexidine Gluconate Cloth 2 % PADS 6 each  6 each Topical Daily Mercy Riding, MD   6 each at 03/02/21 0907   feeding supplement (BOOST / RESOURCE BREEZE) liquid 1 Container  1 Container Oral TID BM Mercy Riding, MD   1 Container at 03/02/21 0907   insulin aspart (novoLOG) injection 0-15 Units  0-15 Units Subcutaneous TID WC Marcelyn Bruins, MD   2 Units at 02/28/21 1716   insulin aspart (novoLOG) injection 0-5 Units  0-5 Units Subcutaneous QHS Marcelyn Bruins, MD       liver oil-zinc oxide (DESITIN) 40 % ointment   Topical PRN Mariel Aloe, MD   Given at 03/02/21 1034   loperamide (IMODIUM) capsule 2 mg  2 mg Oral QID PRN Marcelyn Bruins, MD       MEDLINE mouth rinse  15 mL Mouth Rinse BID Mariel Aloe, MD   15 mL at 03/02/21 2120   metoCLOPramide (REGLAN) injection 5 mg  5 mg Intravenous TID WC PRN Wendee Beavers T, MD   5 mg at 02/21/21 1626   multivitamin with minerals tablet 1 tablet  1 tablet Oral Daily Wendee Beavers T, MD   1 tablet at 03/02/21 0902   ondansetron (ZOFRAN) injection 4 mg  4 mg Intravenous Q6H PRN Marcelyn Bruins, MD       oxyCODONE (Oxy IR/ROXICODONE) immediate release tablet 5 mg  5 mg Oral Q4H PRN Maryanna Shape, NP   5 mg at 03/03/21 0528   pantoprazole (PROTONIX) EC tablet 40 mg  40 mg Oral Daily Marcelyn Bruins, MD   40 mg at 03/02/21 4920     Discharge Medications: Please see discharge summary for a list of discharge medications.  Relevant Imaging Results:  Relevant Lab Results:   Additional Information SSN:[id number]  Leeroy Cha, RN

## 2021-03-04 DIAGNOSIS — R7881 Bacteremia: Secondary | ICD-10-CM | POA: Diagnosis not present

## 2021-03-04 DIAGNOSIS — C801 Malignant (primary) neoplasm, unspecified: Secondary | ICD-10-CM | POA: Diagnosis not present

## 2021-03-04 DIAGNOSIS — G893 Neoplasm related pain (acute) (chronic): Secondary | ICD-10-CM | POA: Diagnosis not present

## 2021-03-04 DIAGNOSIS — E46 Unspecified protein-calorie malnutrition: Secondary | ICD-10-CM | POA: Diagnosis not present

## 2021-03-04 DIAGNOSIS — R509 Fever, unspecified: Secondary | ICD-10-CM | POA: Diagnosis not present

## 2021-03-04 DIAGNOSIS — D649 Anemia, unspecified: Secondary | ICD-10-CM | POA: Diagnosis not present

## 2021-03-04 DIAGNOSIS — I10 Essential (primary) hypertension: Secondary | ICD-10-CM | POA: Diagnosis not present

## 2021-03-04 LAB — CULTURE, BLOOD (ROUTINE X 2)
Special Requests: ADEQUATE
Special Requests: ADEQUATE

## 2021-03-04 LAB — CBC
HCT: 23.8 % — ABNORMAL LOW (ref 36.0–46.0)
Hemoglobin: 7.7 g/dL — ABNORMAL LOW (ref 12.0–15.0)
MCH: 30.6 pg (ref 26.0–34.0)
MCHC: 32.4 g/dL (ref 30.0–36.0)
MCV: 94.4 fL (ref 80.0–100.0)
Platelets: 122 10*3/uL — ABNORMAL LOW (ref 150–400)
RBC: 2.52 MIL/uL — ABNORMAL LOW (ref 3.87–5.11)
RDW: 17.5 % — ABNORMAL HIGH (ref 11.5–15.5)
WBC: 23.1 10*3/uL — ABNORMAL HIGH (ref 4.0–10.5)
nRBC: 0 % (ref 0.0–0.2)

## 2021-03-04 LAB — GLUCOSE, CAPILLARY
Glucose-Capillary: 100 mg/dL — ABNORMAL HIGH (ref 70–99)
Glucose-Capillary: 115 mg/dL — ABNORMAL HIGH (ref 70–99)
Glucose-Capillary: 120 mg/dL — ABNORMAL HIGH (ref 70–99)
Glucose-Capillary: 87 mg/dL (ref 70–99)
Glucose-Capillary: 88 mg/dL (ref 70–99)
Glucose-Capillary: 90 mg/dL (ref 70–99)

## 2021-03-04 LAB — BASIC METABOLIC PANEL
Anion gap: 8 (ref 5–15)
BUN: 28 mg/dL — ABNORMAL HIGH (ref 6–20)
CO2: 21 mmol/L — ABNORMAL LOW (ref 22–32)
Calcium: 8.3 mg/dL — ABNORMAL LOW (ref 8.9–10.3)
Chloride: 105 mmol/L (ref 98–111)
Creatinine, Ser: 0.87 mg/dL (ref 0.44–1.00)
GFR, Estimated: >60 mL/min (ref >60)
Glucose, Bld: 97 mg/dL (ref 70–99)
Potassium: 4.2 mmol/L (ref 3.5–5.1)
Sodium: 134 mmol/L — ABNORMAL LOW (ref 135–145)

## 2021-03-04 NOTE — Progress Notes (Signed)
Pt has been offered food and drinks throughout the night and still has not had an appetite to eat/drink anything. Pt has only drank water.

## 2021-03-04 NOTE — TOC Progression Note (Signed)
Transition of Care [facility]) - Progression Note    Patient Details  Name: JANIYAH BEERY MRN: [id number] Date of Birth: October 10, 1965  Transition of Care [facility]) CM/SW Contact  Leeroy Cha, RN Phone Number: 03/04/2021, 2:36 PM  Clinical Narrative:    Note sent to Shirlee Limerick at [facility] that patient wants to come there at dc    Expected Discharge Plan: Home/Self Care Barriers to Discharge: No Barriers Identified  Expected Discharge Plan and Services Expected Discharge Plan: Home/Self Care       Living arrangements for the past 2 months: Single Family Home                                       Social Determinants of Health (SDOH) Interventions    Readmission Risk Interventions Readmission Risk Prevention Plan 01/06/2021  Transportation Screening Complete  Medication Review (RN Care Manager) Complete  PCP or Specialist appointment within 3-5 days of discharge Complete  HRI or Home Care Consult Complete  SW Recovery Care/Counseling Consult Complete  Palliative Care Screening Not [place] Complete  Some recent data might be hidden

## 2021-03-04 NOTE — Progress Notes (Signed)
[facility] for Infectious Disease   Reason for visit: Follow up on bacteremia  Interval History: repeat blood cultures remain ngtd.  WBC stable at 23.1.  Remains afebrile.  No complaints.  Day 3 total antibiotics   Physical Exam: Constitutional:  Vitals:   03/04/21 1052 03/04/21 1243  BP: 122/68 118/73  Pulse: 99 (!) 103  Resp: 17 20  Temp: 98.2 F (36.8 C) 97.8 F (36.6 C)  SpO2: 99% 100%   patient appears in NAD Respiratory: Normal respiratory effort; CTA B Cardiovascular: RRR GI: soft, nt, nd  Review of Systems: Constitutional: negative for fevers and chills Integument/breast: negative for rash  Lab Results  Component Value Date   WBC 23.1 (H) 03/04/2021   HGB 7.7 (L) 03/04/2021   HCT 23.8 (L) 03/04/2021   MCV 94.4 03/04/2021   PLT 122 (L) 03/04/2021    Lab Results  Component Value Date   CREATININE 0.87 03/04/2021   BUN 28 (H) 03/04/2021   NA 134 (L) 03/04/2021   K 4.2 03/04/2021   CL 105 03/04/2021   CO2 21 (L) 03/04/2021    Lab Results  Component Value Date   ALT 6 02/22/2021   AST 19 02/22/2021   ALKPHOS 227 (H) 02/22/2021     Microbiology: Recent Results (from the past 240 hour(s))  Urine Culture     Status: Abnormal   Collection Time: 02/24/21  9:49 AM   Specimen: Urine, Clean Catch  Result Value Ref Range Status   Specimen Description   Final    URINE, CLEAN CATCH Performed at [facility], [place] [address], [place], [place] [address]    Special Requests   Final    NONE Performed at [facility], [place] [address] [place], [place] [address]    Culture (A)  Final    <10,000 COLONIES/mL INSIGNIFICANT GROWTH Performed at [facility] [address] [place], [place] [address]    Report Status 02/26/2021 FINAL  Final  Culture, blood (routine x 2)     Status: Abnormal   Collection Time: 03/01/21 11:03 AM   Specimen: BLOOD  Result Value Ref Range Status   Specimen Description   Final     BLOOD RIGHT ANTECUBITAL Performed at [facility] [address] [place], [place] [address]    Special Requests   Final    BOTTLES DRAWN AEROBIC AND ANAEROBIC Blood Culture adequate volume Performed at [facility] [address], [place], [place] [address]    Culture  Setup Time   Final    GRAM POSITIVE COCCI IN BOTH AEROBIC AND ANAEROBIC BOTTLES CRITICAL RESULT CALLED TO, READ BACK BY AND VERIFIED WITH: M LILLISTON,[email or web address]@[id number]  03/02/21 [place] Performed at [facility], [place] [address], [place], [place] [address]    Culture ENTEROCOCCUS FAECALIS (A)  Final   Report Status 03/04/2021 FINAL  Final   Organism ID, Bacteria ENTEROCOCCUS FAECALIS  Final      Susceptibility   Enterococcus faecalis - MIC*    AMPICILLIN <=2 SENSITIVE Sensitive     VANCOMYCIN 1 SENSITIVE Sensitive     GENTAMICIN SYNERGY SENSITIVE Sensitive     * ENTEROCOCCUS FAECALIS  Culture, blood (routine x 2)     Status: Abnormal   Collection Time: 03/01/21 11:03 AM   Specimen: BLOOD  Result Value Ref Range Status   Specimen Description   Final    BLOOD BLOOD LEFT HAND Performed at [facility], [place]  [address], [place], [place] [address]    Special Requests   Final    BOTTLES DRAWN AEROBIC AND ANAEROBIC Blood Culture adequate volume Performed at [facility] [address], [place], [place] [address]    Culture  Setup Time   Final    GRAM POSITIVE COCCI IN BOTH AEROBIC AND ANAEROBIC BOTTLES CRITICAL VALUE NOTED.  VALUE IS CONSISTENT WITH PREVIOUSLY REPORTED AND CALLED VALUE.    Culture (A)  Final    ENTEROCOCCUS FAECALIS SUSCEPTIBILITIES PERFORMED ON PREVIOUS CULTURE WITHIN THE LAST 5 DAYS. Performed at [facility], [place] [address] [place], [place] [address]    Report Status 03/04/2021 FINAL  Final  Blood Culture ID Panel (Reflexed)     Status: Abnormal   Collection Time: 03/01/21 11:03 AM  Result Value Ref Range Status    Enterococcus faecalis DETECTED (A) NOT DETECTED Final    Comment: CRITICAL RESULT CALLED TO, READ BACK BY AND VERIFIED WITH: M LILLISTON,[email or web address]@[email or web address]  03/02/21 [place]    Enterococcus Faecium NOT DETECTED NOT DETECTED Final   Listeria monocytogenes NOT DETECTED NOT DETECTED Final   Staphylococcus species NOT DETECTED NOT DETECTED Final   Staphylococcus aureus (BCID) NOT DETECTED NOT DETECTED Final   Staphylococcus epidermidis NOT DETECTED NOT DETECTED Final   Staphylococcus lugdunensis NOT DETECTED NOT DETECTED Final   Streptococcus species NOT DETECTED NOT DETECTED Final   Streptococcus agalactiae NOT DETECTED NOT DETECTED Final   Streptococcus pneumoniae NOT DETECTED NOT DETECTED Final   Streptococcus pyogenes NOT DETECTED NOT DETECTED Final   A.calcoaceticus-baumannii NOT DETECTED NOT DETECTED Final   Bacteroides fragilis NOT DETECTED NOT DETECTED Final   Enterobacterales NOT DETECTED NOT DETECTED Final   Enterobacter cloacae complex NOT DETECTED NOT DETECTED Final   Escherichia coli NOT DETECTED NOT DETECTED Final   Klebsiella aerogenes NOT DETECTED NOT DETECTED Final   Klebsiella oxytoca NOT DETECTED NOT DETECTED Final   Klebsiella pneumoniae NOT DETECTED NOT DETECTED Final   Proteus species NOT DETECTED NOT DETECTED Final   Salmonella species NOT DETECTED NOT DETECTED Final   Serratia marcescens NOT DETECTED NOT DETECTED Final   Haemophilus influenzae NOT DETECTED NOT DETECTED Final   Neisseria meningitidis NOT DETECTED NOT DETECTED Final   Pseudomonas aeruginosa NOT DETECTED NOT DETECTED Final   Stenotrophomonas maltophilia NOT DETECTED NOT DETECTED Final   Candida albicans NOT DETECTED NOT DETECTED Final   Candida auris NOT DETECTED NOT DETECTED Final   Candida glabrata NOT DETECTED NOT DETECTED Final   Candida krusei NOT DETECTED NOT DETECTED Final   Candida parapsilosis NOT DETECTED NOT DETECTED Final   Candida tropicalis NOT DETECTED NOT DETECTED Final   Cryptococcus  neoformans/gattii NOT DETECTED NOT DETECTED Final   Vancomycin resistance NOT DETECTED NOT DETECTED Final    Comment: Performed at [facility], [address], [place], [place] [address]  Culture, blood (routine x 2)     Status: None (Preliminary result)   Collection Time: 03/02/21  4:18 PM   Specimen: BLOOD  Result Value Ref Range Status   Specimen Description   Final    BLOOD LEFT ANTECUBITAL Performed at [facility] [address], [place], [place] [address]    Special Requests   Final    BOTTLES DRAWN AEROBIC ONLY Blood Culture adequate volume Performed at [facility] [address], [place], [place] [address]    Culture   Final    NO GROWTH 2 DAYS Performed at [facility] [address]  [place], [place] [address]    Report Status PENDING  Incomplete  Culture, blood (routine x 2)     Status: None (Preliminary result)   Collection Time: 03/02/21  4:18 PM   Specimen: BLOOD  Result Value Ref Range Status   Specimen Description   Final    BLOOD LEFT ANTECUBITAL Performed at [facility] [address] [place], [place] [address]    Special Requests   Final    BOTTLES DRAWN AEROBIC AND ANAEROBIC Blood Culture adequate volume Performed at [facility] [address], [place], [place] [address]    Culture   Final    NO GROWTH 2 DAYS Performed at [facility] [address] [place], [place] [address]    Report Status PENDING  Incomplete    Impression/Plan:  Enterococcus bacteremia - repeat blood cultures ngtd.  TTE without vegetation noted.   I suspect her source is her GI system.   At this point, clinically she seems to be stable and I recommend 4 weeks of IV ampicillin to assure clearance with bacteremia associated with a current port-a-cath in place.   Metastatic adenocarcinoma of unknown origin - at this point, she has poor performance status and poor nutritional status  and not an optimal candidate for chemotherapy.  If this changes, will need to recheck her TTE and repeat blood cultures to assure clearance before restarting chemotherapy after completion of antibiotics as above. This is to assure port is not infected and no concerns for endocarditis.   Malnutrition - will need improvement nutrition for consideration of restarting chemotherapy.   She can follow up in the ID clinic if there is future improvement in her functional status and she is going to restart chemotherapy.    Otherwise I will sign off, call with questions

## 2021-03-04 NOTE — TOC Progression Note (Signed)
Transition of Care [facility]) - Progression Note    Patient Details  Name: Erin [name] MRN: [id number] Date of Birth: January 08, 1966  Transition of Care [facility]) CM/SW Contact  Leeroy Cha, RN Phone Number: 03/04/2021, 8:55 AM  Clinical Narrative:    Tried to review snf choices with patient on [id number] but was top lethargic top decide will try again [id number]   Expected Discharge Plan: Home/Self Care Barriers to Discharge: No Barriers Identified  Expected Discharge Plan and Services Expected Discharge Plan: Home/Self Care       Living arrangements for the past 2 months: Single Family Home                                       Social Determinants of Health (SDOH) Interventions    Readmission Risk Interventions Readmission Risk Prevention Plan 01/06/2021  Transportation Screening Complete  Medication Review ([place]) Complete  PCP or Specialist appointment within 3-5 days of discharge Complete  HRI or Home Care Consult Complete  SW Recovery Care/Counseling Consult Complete  [place] Complete  Some recent data might be hidden

## 2021-03-04 NOTE — Progress Notes (Signed)
PHARMACY CONSULT NOTE FOR:  OUTPATIENT  PARENTERAL ANTIBIOTIC THERAPY (OPAT)  Indication: Enterococcal Bacteremia Regimen: Ampicillin 12g/day Continuous Infusion End date: 03/29/21  IV antibiotic discharge orders are pended. To discharging provider:  please sign these orders via discharge navigator,  Select New Orders & click on the button choice - Manage This Unsigned Work.     Thank you for allowing pharmacy to be a part of this patients care.  Alycia Rossetti Infectious Diseases Clinical Pharmacist 03/04/2021 3:41 PM   **Pharmacist phone directory can now be found on [email or web address] ([email or web address]).  Listed under [place].

## 2021-03-04 NOTE — Progress Notes (Signed)
Talked to pt about cpap she states she does not want to use it tonight. Rt instructed her to call if she changed her mind.

## 2021-03-04 NOTE — Progress Notes (Addendum)
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 -Bladder neck, urethra, cervical biopsies 12/17/2020 -MSS, tumor mutation burden 4, K-ras amplification, PD-L1 tumor proportion score-0 -[facility] -90% intestinal malignancy (colorectal adenocarcinoma 85%) small intestine adenocarcinoma less than 5%, gastroesophageal adenocarcinoma not excluded -12/05/2020 MRI of the pelvis-poorly marginated enhancing 3.7 x 2.8 x 3.0 cm mass centered at the urethra, solid avidly enhancing bilateral ovarian masses, poorly marginated enhancing 2.4 x 2.3 x 3.0 cm mass in the left uterine cervix, mild to moderate bilateral common iliac, bilateral external iliac, and bilateral inguinal lymphadenopathy, diffuse patchy confluent nodular replacement of the pelvic osseous structures. -12/13/2020 CEA 1864, CA125 29.2 -12/16/2020 CT abdomen/pelvis-rapidly increasing size of the left ovary with some adjacent ascites, enlarging masses elsewhere, masslike area of the cervix and potentially within a urethral diverticulum, well-circumscribed left adrenal lesion measuring 3.2 x 2.6 cm, heterogeneous pattern of subtle sclerosis and lucency in the spine. -12/16/2020 MRI of the lumbar spine-diffusely abnormal appearance of the bone marrow throughout the visualized lumbar spine and pelvis highly suspicious for diffuse osseous metastatic disease, retroperitoneal/iliac adenopathy with left larger than right adnexal masses. -12/17/2020 CT chest-no acute intrathoracic pathology -Upper endoscopy 12/22/2020-gastritis, gastric nodule biopsy-adenocarcinoma, poorly differentiated with signet ring morphology -Colonoscopy 12/22/2020-polyps removed from the ascending and sigmoid colon, extrinsic compression of the sigmoid colon-tubulovillous adenoma without high-grade dysplasia, tubular adenoma and hyperplastic polyps -12/27/2020 cycle #1 FOLFOX -01/10/2021  cycle #2 FOLFOX -01/27/2021 cycle 3 FOLFOX, oxaliplatin dose reduced -02/20/2021 CT abdomen/pelvis without contrast-increased size of the bilateral ovarian masses, interval diffuse bony metastatic disease, progressive metastatic bilateral pelvic and inguinal lymphadenopathy, increased size of the left adrenal metastasis, interval nodular densities at the lung bases suspicious for metastases, interval mild right hydronephrosis and hydroureter -02/22/2021 CEA 440 2.  Abdominal pain secondary #1 3.  Constipation 4.  Normocytic anemia 5.  Leukocytosis 6.  Protein calorie malnutrition 7.  Obstructive sleep apnea 8.  Diabetes mellitus 9.  Hypertension 10.  Possible pulmonary hypertension noted on CT chest 11.  Hospital admission 02/20/2021-intractable nausea and vomiting 12.  Right hydronephrosis and hydroureter 13.  Thrombocytopenia 14.  Low-grade fever 15.  E faecalis bacteremia  Erin [name] appears unchanged.  She was found to have E faecalis bacteremia this past weekend. Sensitives pending. Repeat blood cultures drawn 03/02/2021 are negative to date.  She is currently on antibiotics and is afebrile.  The bacteremia is likely due to cancer in her GI tract.  She continues to have persistent leukocytosis which may be a leukemoid reaction from her cancer as this predates this admission.  Would hold off on Port-A-Cath removal unless the repeat blood cultures are positive.  Continue supportive care and antibiotics.  Her platelet count continues to improve and is up to 122,000 this morning.  Pain currently controlled with oxycodone 5 mg every 4 hours as needed.  Erin [name] understands that she is not a candidate for further chemotherapy unless her performance status and nutritional status improve.  Recommendations: 1.  Continue supportive care and antibiotics.   2.  Monitor CBC 3.  Hold on Port-A-Cath removal unless repeat blood cultures are positive. 4.  Awaiting SNF placement.  Unless her  performance status and nutritional status improved, she is not a candidate for additional chemotherapy.  If these do not improve, we would recommend hospice.  [name], [name], [name], [name]  Erin [name] was interviewed and examined.  She was confused when I saw her at approximately 7 AM this morning.  Thrombocytopenia continues to improve.  I suspect the confusion is related to hospital and critical illness associated delirium.  No She has limited intake of nutrition and is not ambulatory.  She will need skilled nursing facility placement.  We will recommend hospice care if her performance status does not improve.  I was present for greater than 50% of today's visit.  I performed medical decision making.  [name], MD  SUBJECTIVE: Has ongoing confusion this morning.  Received a dose of oxycodone overnight, but nursing reports that she was confused prior to receiving oxycodone.  No other complaints this morning.  Afebrile.  Oncology History  Gastric cancer ([place])  12/27/2020 Initial Diagnosis   Gastric cancer ([place])   12/27/2020 -  Chemotherapy   Patient is on Treatment Plan : GASTRIC FOLFOX q14d x 12 cycles      PHYSICAL EXAMINATION:  Vitals:   03/03/21 2105 03/04/21 0410  BP: 116/76 94/66  Pulse: (!) 104 93  Resp: 17 20  Temp: 98.6 F (37 C) 98.8 F (37.1 C)  SpO2: 96% 100%   Filed Weights   03/02/21 0500 03/03/21 0409 03/04/21 0442  Weight: 111.9 kg 116.5 kg 113.9 kg    Intake/Output from previous day: 01/23 0701 - 01/24 0700 In: 2040 [P.O.:1440; IV Piggyback:600] Out: 950 [Urine:950] HEENT: No thrush or bleeding Abdomen: Soft, tender in the left lower abdomen Vascular: No leg edema Skin: No petechiae  Port-A-Cath site without erythema  LABORATORY DATA:  I have reviewed the data as listed CMP Latest Ref Rng & Units 03/04/2021 03/03/2021 02/28/2021  Glucose 70 - 99 mg/dL 97 102(H) 126(H)  BUN 6 - 20 mg/dL 28(H) 31(H) 26(H)  Creatinine 0.44 - 1.00 mg/dL  0.87 0.95 1.08(H)  Sodium 135 - 145 mmol/L 134(L) 130(L) 133(L)  Potassium 3.5 - 5.1 mmol/L 4.2 4.2 4.2  Chloride 98 - 111 mmol/L 105 102 104  CO2 22 - 32 mmol/L 21(L) 20(L) 20(L)  Calcium 8.9 - 10.3 mg/dL 8.3(L) 8.4(L) 8.7(L)  Total Protein 6.5 - 8.1 g/dL - - -  Total Bilirubin 0.3 - 1.2 mg/dL - - -  Alkaline Phos 38 - 126 U/L - - -  AST 15 - 41 U/L - - -  ALT 0 - 44 U/L - - -    Lab Results  Component Value Date   WBC 23.1 (H) 03/04/2021   HGB 7.7 (L) 03/04/2021   HCT 23.8 (L) 03/04/2021   MCV 94.4 03/04/2021   PLT 122 (L) 03/04/2021   NEUTROABS 13.5 (H) 02/27/2021    Lab Results  Component Value Date   CEA1 440.0 (H) 02/22/2021   CEA 617.97 (H) 02/11/2021    CT Abdomen Pelvis Wo Contrast  Result Date: 02/20/2021 CLINICAL DATA:  Acute, non localized abdominal pain. Nausea, vomiting, diarrhea and fatigue for the past week. Receiving chemotherapy for gastric and ovarian cancer. EXAM: CT ABDOMEN AND PELVIS WITHOUT CONTRAST TECHNIQUE: Multidetector CT imaging of the abdomen and pelvis was performed following the standard protocol without IV contrast. RADIATION DOSE REDUCTION: This exam was performed according to the departmental dose-optimization program which includes automated exposure control, adjustment of the mA and/or kV according to patient size and/or use of iterative reconstruction technique. COMPARISON:  Chest CT dated 12/17/2020. Abdomen and pelvis CT dated 12/16/2020. FINDINGS: Lower chest: Mildly enlarged heart. Small amount of residual atelectasis/scarring in the right lower lobe, including a somewhat oval wedge-shaped area at the posterior right lung base which is significantly smaller. Interval visualization of a 5 mm nodular  density at the right lateral costophrenic angle on image number 20/6. There is a similar area more anteriorly on image number 22/6. There is also an interval 11 x 8 mm sub solid nodular density in the right middle lobe on image number 9/6. There is  also an interval nodular density in the left lower lobe with eccentric cavitation measuring 11 x 7 mm on image number 8/6. Hepatobiliary: No focal liver abnormality is seen. No gallstones, gallbladder wall thickening, or biliary dilatation. Pancreas: Unremarkable. No pancreatic ductal dilatation or surrounding inflammatory changes. Spleen: Normal in size without focal abnormality. Adrenals/Urinary Tract: An exophytic posterior left adrenal mass is again demonstrated. This measures 3.2 x 2.8 cm on image number 19/2, previously 3.2 x 2.6 cm. Stable normal appearing right adrenal gland. Interval mild dilatation of the right renal collecting system and ureter to the level of the ureterovesical junction with no obstructing stone or mass visualized. Stable normal appearing left kidney and ureter. The urinary bladder is poorly distended with mild to moderate diffuse wall thickening. Stomach/Bowel: A small hiatal hernia is unchanged. Unremarkable small bowel and colon. Normal appearing appendix. Vascular/Lymphatic: Bilateral enlarged pelvic and inguinal lymph nodes with progression. The largest is a left pelvic sidewall node with a short axis diameter of 17 mm on image number 75/2, previously 14 mm. Reproductive: A large left adnexal mass is again demonstrated with an interval increase in size. This measures 13.0 x 8.2 cm on image number 64/2, previously 10.8 x 8.8 cm. A right adnexal mass is again demonstrated, currently measuring 3.4 x 2.7 cm on image number 67/2, previously 2.9 cm in maximum diameter. Normal appearing uterus. Other: Small umbilical and infraumbilical hernias containing fat. Small left inguinal hernia containing fat. Musculoskeletal: Interval diffuse heterogeneous bone sclerosis. IMPRESSION: 1. Increased size of bilateral ovarian masses, especially on the left. 2. Interval diffuse bony metastatic disease. 3. Progressive metastatic bilateral pelvic and inguinal lymphadenopathy. 4. Increased size of a left  adrenal metastasis. 5. Interval nodular densities at the lung bases suspicious for metastases. 6. Interval mild right hydronephrosis and hydroureter to the level of the ureterovesical junction. This could be due to recent stone passage, obstruction by wall thickening of the bladder or a nonvisualized obstructing ureteral metastasis. Electronically Signed   By: Claudie Revering M.D.   On: 02/20/2021 18:28   CT HEAD WO CONTRAST (5MM)  Result Date: 02/22/2021 CLINICAL DATA:  Encephalopathy EXAM: CT HEAD WITHOUT CONTRAST TECHNIQUE: Contiguous axial images were obtained from the base of the skull through the vertex without intravenous contrast. RADIATION DOSE REDUCTION: This exam was performed according to the departmental dose-optimization program which includes automated exposure control, adjustment of the mA and/or kV according to patient size and/or use of iterative reconstruction technique. COMPARISON:  None. FINDINGS: Brain: There is no mass, hemorrhage or extra-axial collection. The size and configuration of the ventricles and extra-axial CSF spaces are normal. The brain parenchyma is normal, without acute or chronic infarction. Vascular: No abnormal hyperdensity of the major intracranial arteries or dural venous sinuses. No intracranial atherosclerosis. Skull: The visualized skull base, calvarium and extracranial soft tissues are normal. Sinuses/Orbits: No fluid levels or advanced mucosal thickening of the visualized paranasal sinuses. No mastoid or middle ear effusion. The orbits are normal. IMPRESSION: Normal head CT. Electronically Signed   By: Ulyses Jarred M.D.   On: 02/22/2021 22:14   MR BRAIN W WO CONTRAST  Result Date: 03/03/2021 CLINICAL DATA:  Metastatic disease evaluation EXAM: MRI HEAD WITHOUT AND WITH CONTRAST TECHNIQUE: Multiplanar,  multiecho pulse sequences of the brain and surrounding structures were obtained without and with intravenous contrast. CONTRAST:  68m GADAVIST GADOBUTROL 1 MMOL/ML  IV SOLN COMPARISON:  No prior MRI, correlation is made with CT head 02/22/2021 FINDINGS: Evaluation is somewhat limited by motion artifact, particularly on postcontrast imaging. Brain: No restricted diffusion to suggest acute or subacute infarct. No acute hemorrhage, mass, mass effect, or midline shift. No foci of hemosiderin deposition to suggest remote hemorrhage. Scattered T2 hyperintense signal in the periventricular white matter, likely the sequela of mild chronic small vessel ischemic disease. No hydrocephalus or extra-axial collection. Vascular: Normal flow voids. Skull and upper cervical spine: No suspicious osseous lesions. Sinuses/Orbits: Negative. Other: None. IMPRESSION: Evaluation is limited by motion artifact, particularly on postcontrast imaging. Within this limitation no definite enhancing foci to suggest metastatic disease. No acute intracranial process. Electronically Signed   By: AMerilyn BabaM.D.   On: 03/03/2021 23:47   DG CHEST PORT 1 VIEW  Result Date: 02/20/2021 CLINICAL DATA:  History of gastric and ovarian cancer. Fatigue, nausea, vomiting, diarrhea EXAM: PORTABLE CHEST 1 VIEW COMPARISON:  12/31/2020 FINDINGS: Right Port-A-Cath in place with the tip at the cavoatrial junction. Heart is borderline in size. Mild vascular congestion. No confluent airspace opacities, effusions or overt edema. No acute bony abnormality. IMPRESSION: Borderline heart size.  Mild vascular congestion. Electronically Signed   By: KRolm BaptiseM.D.   On: 02/20/2021 21:30   ECHOCARDIOGRAM COMPLETE  Result Date: 03/02/2021    ECHOCARDIOGRAM REPORT   Patient Name:   SROAN [name] Date of Exam: 03/02/2021 Medical Rec #:  [id number]          Height:       64.0 in Accession #:    [id number]         Weight:       246.7 lb Date of Birth:  04/07/1965-11-08          BSA:          2.139 m Patient Age:    89years            BP:           99/59 mmHg Patient Gender: F                   HR:           100 bpm. Exam  Location:  Inpatient Procedure: 2D Echo, Cardiac Doppler and Color Doppler Indications:    BACTEREMIA  History:        Patient has no prior history of Echocardiogram examinations.                 Signs/Symptoms:Shortness of Breath and Bacteremia; Risk                 Factors:Hypertension and Diabetes. HLD.  Sonographer:    JBeryle BeamsReferring Phys:: [id number] VU  Sonographer Comments: Patient is morbidly obese. PATIENT REFUSED SSN/ ALSO HOLDING BREATHE DURING STUDY. IMPRESSIONS  1. Left ventricular ejection fraction, by estimation, is 60 to 65%. The left ventricle has normal function. The left ventricle has no regional wall motion abnormalities. Left ventricular diastolic parameters are consistent with Grade I diastolic dysfunction (impaired relaxation).  2. Right ventricular systolic function is normal. The right ventricular size is normal. There is mildly elevated pulmonary artery systolic pressure.  3. A small pericardial effusion is present.  4. The mitral valve is normal in structure. No evidence of mitral  valve regurgitation. No evidence of mitral stenosis.  5. The aortic valve is tricuspid. There is moderate calcification of the aortic valve. There is mild thickening of the aortic valve. Aortic valve regurgitation is not visualized. No aortic stenosis is present.  6. The inferior vena cava is normal in size with greater than 50% respiratory variability, suggesting right atrial pressure of 3 mmHg. FINDINGS  Left Ventricle: Left ventricular ejection fraction, by estimation, is 60 to 65%. The left ventricle has normal function. The left ventricle has no regional wall motion abnormalities. The left ventricular internal cavity size was normal in size. There is  no left ventricular hypertrophy. Left ventricular diastolic parameters are consistent with Grade I diastolic dysfunction (impaired relaxation). Normal left ventricular filling pressure. Right Ventricle: The right ventricular size is normal. No  increase in right ventricular wall thickness. Right ventricular systolic function is normal. There is mildly elevated pulmonary artery systolic pressure. The tricuspid regurgitant velocity is 3.21  m/s, and with an assumed right atrial pressure of 3 mmHg, the estimated right ventricular systolic pressure is 67.1 mmHg. Left Atrium: Left atrial size was normal in size. Right Atrium: Right atrial size was normal in size. Pericardium: A small pericardial effusion is present. Mitral Valve: The mitral valve is normal in structure. No evidence of mitral valve regurgitation. No evidence of mitral valve stenosis. Tricuspid Valve: The tricuspid valve is normal in structure. Tricuspid valve regurgitation is mild . No evidence of tricuspid stenosis. Aortic Valve: The aortic valve is tricuspid. There is moderate calcification of the aortic valve. There is mild thickening of the aortic valve. Aortic valve regurgitation is not visualized. No aortic stenosis is present. Aortic valve mean gradient measures 3.5 mmHg. Aortic valve peak gradient measures 7.3 mmHg. Aortic valve area, by VTI measures 2.13 cm. Pulmonic Valve: The pulmonic valve was normal in structure. Pulmonic valve regurgitation is mild. No evidence of pulmonic stenosis. Aorta: The aortic root is normal in size and structure. Venous: The inferior vena cava is normal in size with greater than 50% respiratory variability, suggesting right atrial pressure of 3 mmHg. IAS/Shunts: No atrial level shunt detected by color flow Doppler.  LEFT VENTRICLE PLAX 2D LVIDd:         4.40 cm     Diastology LVIDs:         2.30 cm     LV e' medial:    9.79 cm/s LV PW:         1.10 cm     LV E/e' medial:  7.7 LV IVS:        0.90 cm     LV e' lateral:   12.60 cm/s LVOT diam:     2.00 cm     LV E/e' lateral: 6.0 LV SV:         42 LV SV Index:   20 LVOT Area:     3.14 cm  LV Volumes (MOD) LV vol d, MOD A2C: 50.2 ml LV vol d, MOD A4C: 38.9 ml LV vol s, MOD A2C: 8.5 ml LV vol s, MOD A4C: 9.6  ml LV SV MOD A2C:     41.7 ml LV SV MOD A4C:     38.9 ml LV SV MOD BP:      36.7 ml RIGHT VENTRICLE             IVC RV S prime:     17.90 cm/s  IVC diam: 2.00 cm TAPSE (M-mode): 2.4 cm LEFT ATRIUM  Index        RIGHT ATRIUM           Index LA diam:        4.30 cm 2.01 cm/m   RA Area:     16.70 cm LA Vol (A2C):   44.6 ml 20.85 ml/m  RA Volume:   48.50 ml  22.68 ml/m LA Vol (A4C):   49.8 ml 23.28 ml/m LA Biplane Vol: 48.1 ml 22.49 ml/m  AORTIC VALVE                    PULMONIC VALVE AV Area (Vmax):    1.98 cm     PV Vmax:       0.89 m/s AV Area (Vmean):   1.98 cm     PV Vmean:      55.000 cm/s AV Area (VTI):     2.13 cm     PV VTI:        0.118 m AV Vmax:           135.50 cm/s  PV Peak grad:  3.1 mmHg AV Vmean:          88.550 cm/s  PV Mean grad:  1.0 mmHg AV VTI:            0.196 m AV Peak Grad:      7.3 mmHg AV Mean Grad:      3.5 mmHg LVOT Vmax:         85.25 cm/s LVOT Vmean:        55.800 cm/s LVOT VTI:          0.133 m LVOT/AV VTI ratio: 0.68  AORTA Ao Root diam: 2.90 cm Ao Asc diam:  3.10 cm MITRAL VALVE                TRICUSPID VALVE MV Area (PHT): 3.11 cm     TV Peak grad:   33.4 mmHg MV Decel Time: 244 msec     TV Mean grad:   22.0 mmHg MV E velocity: 75.30 cm/s   TV Vmax:        2.89 m/s MV A velocity: 122.00 cm/s  TV Vmean:       224.0 cm/s MV E/A ratio:  0.62         TV VTI:         0.78 msec                             TR Peak grad:   41.2 mmHg                             TR Vmax:        321.00 cm/s                              SHUNTS                             Systemic VTI:  0.13 m                             Systemic Diam: 2.00 cm Skeet Latch MD Electronically signed by Skeet Latch MD Signature Date/Time: 03/02/2021/3:52:35 PM    Final      Future  Appointments  Date Time Provider [place]  03/13/2021 12:45 PM [facility] PHLEBOTOMIST [facility] None  03/13/2021  1:00 PM [facility] FLUSH ROOM [facility] None  03/13/2021  1:15 PM Owens Shark, NP [facility] None        LOS: 12 days

## 2021-03-04 NOTE — Progress Notes (Signed)
PROGRESS NOTE    Erin [name]  MEQ:[id number] DOB: 1965/05/17 DOA: 02/20/2021 PCP: Merryl Hacker, No   Brief Narrative: Erin [name] is a 56 y.o. 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 Patient presented secondary to nausea/vomiting/diarrhea and abdominal pain. Hospitalization complicated by recurrent anemia. Fecal occult blood test negative. Patient has received 5 units of PRBC to date. Platelets improving. Now found to have E. Faecalis bacteremia.   Assessment & Plan:   * Severe anemia- (present on admission) Secondary to chemotherapy. Hemoglobin of 6.0 on admission. Patient has received 5 units of PRBC to date. Most recent rebound hemoglobin of 8.6 with drift down to 7.7 and currently stable. -CBC in AM  Severe sepsis [facility]) Not present on admission. Unsure of source. Port site does not appear infected. Associated leukocytosis, fevers, hypotension. Blood cultures (1/21) positive for GPCs with BCID suggesting enterococcus faecalis. Started on ampicillin IV. -See problem, Bacteremia due to Enterococcus  Thrombocytopenia ([place]) Related to chemotherapy. Improving.  Hypokalemia- (present on admission) -Potassium supplementation as needed  Essential hypertension- (present on admission) Patient is on hydrochlorothiazide, Coreg and amlodipine as an outpatient. Coreg and amlodipine continued. Blood pressure mostly well controlled but with hypotension in setting of worsened anemia and sepsis. -Continue Coreg  Hypotension-resolved as of 03/03/2021 Likely related to combination of anemia, sepsis and iatrogenic from antihypertensives.  Given a 500 mL NS bolus. Resolved.  Somnolence Oxycodone reduced. Possibly some effect from underlying bacteremia. Somnolence is improved.  Leukocytosis Likely secondary to bacteremia/infection. Initial improvement, but now  trending back up -CBC in AM -Treat infection  Protein-calorie malnutrition, severe Patient continues to have poor oral intake which may affect her candidacy for future treatment of cancer.  Dietitian recommendations: Continue Boost Breeze TID and Prosource Plus BID. Continue to provide encouragement with eating and drinking as able.   Cancer related pain- (present on admission) -Continue Oxycodone prn  Metastatic adenocarcinoma of unknown origin [facility])- (present on admission) Patient follows with Dr. Benay Spice and is receiving chemotherapy. Recent imaging significant for progression of disease. Palliative care consulted (1/14) and decision made at that time was for full code/full scop treatment.  Diabetes mellitus ([place]) Hemoglobin A1C of 7.1 -Continue SSI  Bacteremia due to Enterococcus Infectious disease consulted. Likely GI source. Concern for possible endocarditis. Transthoracic Echocardiogram without evidence of vegetation. Repeat blood cultures (1/22) are no growth to date -ID recommendations: Ampicillin IV, consideration of possible Transesophageal Echocardiogram depending on continued goals of care discussions -Follow-up repeat blood cultures (1/22)     DVT prophylaxis: SCDs Code Status:   Code Status: Full Code Family Communication: None at bedside Disposition Plan: Discharge to SNF likely in 3+ days pending stable hemoglobin/WBC, blood pressure and outpatient blood culture regimen   Consultants:  Medical oncology Palliative care medicine  Procedures:  None  Antimicrobials: Ampicillin IV    Subjective: Feeling okay. No appetite. Doesn't like the food much either.  Objective: Vitals:   03/03/21 2105 03/04/21 0410 03/04/21 0442 03/04/21 0942  BP: 116/76 94/66  122/74  Pulse: (!) 104 93  100  Resp: 17 20  18   Temp: 98.6 F (37 C) 98.8 F (37.1 C)  98.3 F (36.8 C)  TempSrc: Oral Oral  Oral  SpO2: 96% 100%  99%  Weight:   113.9 kg   Height:         Intake/Output Summary (Last 24 hours) at 03/04/2021 0944 Last data filed at 03/04/2021  4008 Gross per 24 hour  Intake 1800 ml  Output 950 ml  Net 850 ml    Filed Weights   03/02/21 0500 03/03/21 0409 03/04/21 0442  Weight: 111.9 kg 116.5 kg 113.9 kg    Examination:  General exam: Appears calm and comfortable Respiratory system: Clear to auscultation. Respiratory effort normal. Cardiovascular system: S1 & S2 heard, RRR. No murmurs, rubs, gallops or clicks. Gastrointestinal system: Abdomen is nondistended, soft and moderately tender. Normal bowel sounds heard. Central nervous system: Alert and oriented. No focal neurological deficits. Musculoskeletal:  No calf tenderness Skin: No cyanosis. No rashes    Data Reviewed: I have personally reviewed following labs and imaging studies  CBC Lab Results  Component Value Date   WBC 23.1 (H) 03/04/2021   RBC 2.52 (L) 03/04/2021   HGB 7.7 (L) 03/04/2021   HCT 23.8 (L) 03/04/2021   MCV 94.4 03/04/2021   MCH 30.6 03/04/2021   PLT 122 (L) 03/04/2021   MCHC 32.4 03/04/2021   RDW 17.5 (H) 03/04/2021   LYMPHSABS 1.1 02/27/2021   MONOABS 0.9 02/27/2021   EOSABS 0.0 02/27/2021   BASOSABS 0.0 67/61/9509     Last metabolic panel Lab Results  Component Value Date   NA 134 (L) 03/04/2021   K 4.2 03/04/2021   CL 105 03/04/2021   CO2 21 (L) 03/04/2021   BUN 28 (H) 03/04/2021   CREATININE 0.87 03/04/2021   GLUCOSE 97 03/04/2021   GFRNONAA >60 03/04/2021   GFRAA 78 03/08/2020   CALCIUM 8.3 (L) 03/04/2021   PHOS 2.6 02/22/2021   PROT 5.8 (L) 02/22/2021   ALBUMIN 1.9 (L) 02/22/2021   LABGLOB 3.0 03/08/2020   AGRATIO 1.3 03/08/2020   BILITOT 1.5 (H) 02/22/2021   ALKPHOS 227 (H) 02/22/2021   AST 19 02/22/2021   ALT 6 02/22/2021   ANIONGAP 8 03/04/2021    CBG (last 3)  Recent Labs    03/04/21 0039 03/04/21 0358 03/04/21 0749  GLUCAP 87 90 100*      GFR: Estimated Creatinine Clearance: 90.4 mL/min (by C-G formula  based on SCr of 0.87 mg/dL).  Coagulation Profile: Recent Labs  Lab 02/27/21 0321  INR 1.6*     Recent Results (from the past 240 hour(s))  Urine Culture     Status: Abnormal   Collection Time: 02/24/21  9:49 AM   Specimen: Urine, Clean Catch  Result Value Ref Range Status   Specimen Description   Final    URINE, CLEAN CATCH Performed at [facility], [place] [address], [place], [place] [address]    Special Requests   Final    NONE Performed at [facility], [place] [address], [place], [place] [address]    Culture (A)  Final    <10,000 COLONIES/mL INSIGNIFICANT GROWTH Performed at [place] [address], [place], [place] [address]    Report Status 02/26/2021 FINAL  Final  Culture, blood (routine x 2)     Status: Abnormal   Collection Time: 03/01/21 11:03 AM   Specimen: BLOOD  Result Value Ref Range Status   Specimen Description   Final    BLOOD RIGHT ANTECUBITAL Performed at [place] [address], [place], [place] [address]    Special Requests   Final    BOTTLES DRAWN AEROBIC AND ANAEROBIC Blood Culture adequate volume Performed at [place] [address], [place], [place] [address]    Culture  Setup Time   Final  GRAM POSITIVE COCCI IN BOTH AEROBIC AND ANAEROBIC BOTTLES CRITICAL RESULT CALLED TO, READ BACK BY AND VERIFIED WITH: M LILLISTON,[email or web address]@[id number]  03/02/21 [place] Performed at [facility], [place] [address], [place], [place] [address]    Culture ENTEROCOCCUS FAECALIS (A)  Final   Report Status 03/04/2021 FINAL  Final   Organism ID, Bacteria ENTEROCOCCUS FAECALIS  Final      Susceptibility   Enterococcus faecalis - MIC*    AMPICILLIN <=2 SENSITIVE Sensitive     VANCOMYCIN 1 SENSITIVE Sensitive     GENTAMICIN SYNERGY SENSITIVE Sensitive     * ENTEROCOCCUS FAECALIS  Culture, blood (routine x 2)     Status: Abnormal   Collection Time: 03/01/21 11:03 AM   Specimen:  BLOOD  Result Value Ref Range Status   Specimen Description   Final    BLOOD BLOOD LEFT HAND Performed at [facility], [place] [address], [place], [place] [address]    Special Requests   Final    BOTTLES DRAWN AEROBIC AND ANAEROBIC Blood Culture adequate volume Performed at [place] [address] [place], [place] [address]    Culture  Setup Time   Final    GRAM POSITIVE COCCI IN BOTH AEROBIC AND ANAEROBIC BOTTLES CRITICAL VALUE NOTED.  VALUE IS CONSISTENT WITH PREVIOUSLY REPORTED AND CALLED VALUE.    Culture (A)  Final    ENTEROCOCCUS FAECALIS SUSCEPTIBILITIES PERFORMED ON PREVIOUS CULTURE WITHIN THE LAST 5 DAYS. Performed at [facility], [place] [address], [place], [place] [address]    Report Status 03/04/2021 FINAL  Final  Blood Culture ID Panel (Reflexed)     Status: Abnormal   Collection Time: 03/01/21 11:03 AM  Result Value Ref Range Status   Enterococcus faecalis DETECTED (A) NOT DETECTED Final    Comment: CRITICAL RESULT CALLED TO, READ BACK BY AND VERIFIED WITH: M LILLISTON,[email or web address]@[email or web address]  03/02/21 [place]    Enterococcus Faecium NOT DETECTED NOT DETECTED Final   Listeria monocytogenes NOT DETECTED NOT DETECTED Final   Staphylococcus species NOT DETECTED NOT DETECTED Final   Staphylococcus aureus (BCID) NOT DETECTED NOT DETECTED Final   Staphylococcus epidermidis NOT DETECTED NOT DETECTED Final   Staphylococcus lugdunensis NOT DETECTED NOT DETECTED Final   Streptococcus species NOT DETECTED NOT DETECTED Final   Streptococcus agalactiae NOT DETECTED NOT DETECTED Final   Streptococcus pneumoniae NOT DETECTED NOT DETECTED Final   Streptococcus pyogenes NOT DETECTED NOT DETECTED Final   A.calcoaceticus-baumannii NOT DETECTED NOT DETECTED Final   Bacteroides fragilis NOT DETECTED NOT DETECTED Final   Enterobacterales NOT DETECTED NOT DETECTED Final   Enterobacter cloacae complex NOT DETECTED NOT DETECTED Final   Escherichia coli NOT  DETECTED NOT DETECTED Final   Klebsiella aerogenes NOT DETECTED NOT DETECTED Final   Klebsiella oxytoca NOT DETECTED NOT DETECTED Final   Klebsiella pneumoniae NOT DETECTED NOT DETECTED Final   Proteus species NOT DETECTED NOT DETECTED Final   Salmonella species NOT DETECTED NOT DETECTED Final   Serratia marcescens NOT DETECTED NOT DETECTED Final   Haemophilus influenzae NOT DETECTED NOT DETECTED Final   Neisseria meningitidis NOT DETECTED NOT DETECTED Final   Pseudomonas aeruginosa NOT DETECTED NOT DETECTED Final   Stenotrophomonas maltophilia NOT DETECTED NOT DETECTED Final   Candida albicans NOT DETECTED NOT DETECTED Final   Candida auris NOT DETECTED NOT DETECTED Final   Candida glabrata NOT DETECTED NOT DETECTED Final   Candida krusei NOT DETECTED NOT DETECTED Final   Candida parapsilosis NOT DETECTED NOT DETECTED Final   Candida  tropicalis NOT DETECTED NOT DETECTED Final   Cryptococcus neoformans/gattii NOT DETECTED NOT DETECTED Final   Vancomycin resistance NOT DETECTED NOT DETECTED Final    Comment: Performed at [facility], [place] [address], [place], [place] [address]  Culture, blood (routine x 2)     Status: None (Preliminary result)   Collection Time: 03/02/21  4:18 PM   Specimen: BLOOD  Result Value Ref Range Status   Specimen Description   Final    BLOOD LEFT ANTECUBITAL Performed at [place] [address], [place], [place] [address]    Special Requests   Final    BOTTLES DRAWN AEROBIC ONLY Blood Culture adequate volume Performed at [place] [address] [place], [place] [address]    Culture   Final    NO GROWTH 2 DAYS Performed at [place] [address] [place], [place] [address]    Report Status PENDING  Incomplete  Culture, blood (routine x 2)     Status: None (Preliminary result)   Collection Time: 03/02/21  4:18 PM   Specimen: BLOOD  Result Value Ref Range Status   Specimen Description    Final    BLOOD LEFT ANTECUBITAL Performed at [place] [address] [place], [place] [address]    Special Requests   Final    BOTTLES DRAWN AEROBIC AND ANAEROBIC Blood Culture adequate volume Performed at [place] [address], [place], [place] [address]    Culture   Final    NO GROWTH 2 DAYS Performed at [place] [address], [place], [place] [address]    Report Status PENDING  Incomplete         Radiology Studies: MR BRAIN W WO CONTRAST  Result Date: 03/03/2021 CLINICAL DATA:  Metastatic disease evaluation EXAM: MRI HEAD WITHOUT AND WITH CONTRAST TECHNIQUE: Multiplanar, multiecho pulse sequences of the brain and surrounding structures were obtained without and with intravenous contrast. CONTRAST:  68mL GADAVIST GADOBUTROL 1 MMOL/ML IV SOLN COMPARISON:  No prior MRI, correlation is made with CT head 02/22/2021 FINDINGS: Evaluation is somewhat limited by motion artifact, particularly on postcontrast imaging. Brain: No restricted diffusion to suggest acute or subacute infarct. No acute hemorrhage, mass, mass effect, or midline shift. No foci of hemosiderin deposition to suggest remote hemorrhage. Scattered T2 hyperintense signal in the periventricular white matter, likely the sequela of mild chronic small vessel ischemic disease. No hydrocephalus or extra-axial collection. Vascular: Normal flow voids. Skull and upper cervical spine: No suspicious osseous lesions. Sinuses/Orbits: Negative. Other: None. IMPRESSION: Evaluation is limited by motion artifact, particularly on postcontrast imaging. Within this limitation no definite enhancing foci to suggest metastatic disease. No acute intracranial process. Electronically Signed   By: Merilyn Baba M.D.   On: 03/03/2021 23:47   ECHOCARDIOGRAM COMPLETE  Result Date: 03/02/2021    ECHOCARDIOGRAM REPORT   Patient Name:   Erin [name] Date of Exam: 03/02/2021 Medical Rec #:   [id number]           Height:       64.0 in Accession #:    [id number]          Weight:       246.7 lb Date of Birth:  Oct 18, 1965           BSA:          2.139 m Patient Age:    52 years  BP:           99/59 mmHg Patient Gender: F                   HR:           100 bpm. Exam Location:  Inpatient Procedure: 2D Echo, Cardiac Doppler and Color Doppler Indications:    BACTEREMIA  History:        Patient has no prior history of Echocardiogram examinations.                 Signs/Symptoms:Shortness of Breath and Bacteremia; Risk                 Factors:Hypertension and Diabetes. HLD.  Sonographer:    Beryle Beams Referring Phys: [id number] [place] T VU  Sonographer Comments: Patient is morbidly obese. PATIENT REFUSED SSN/ ALSO HOLDING BREATHE DURING STUDY. IMPRESSIONS  1. Left ventricular ejection fraction, by estimation, is 60 to 65%. The left ventricle has normal function. The left ventricle has no regional wall motion abnormalities. Left ventricular diastolic parameters are consistent with Grade I diastolic dysfunction (impaired relaxation).  2. Right ventricular systolic function is normal. The right ventricular size is normal. There is mildly elevated pulmonary artery systolic pressure.  3. A small pericardial effusion is present.  4. The mitral valve is normal in structure. No evidence of mitral valve regurgitation. No evidence of mitral stenosis.  5. The aortic valve is tricuspid. There is moderate calcification of the aortic valve. There is mild thickening of the aortic valve. Aortic valve regurgitation is not visualized. No aortic stenosis is present.  6. The inferior vena cava is normal in size with greater than 50% respiratory variability, suggesting right atrial pressure of 3 mmHg. FINDINGS  Left Ventricle: Left ventricular ejection fraction, by estimation, is 60 to 65%. The left ventricle has normal function. The left ventricle has no regional wall motion abnormalities. The left ventricular internal  cavity size was normal in size. There is  no left ventricular hypertrophy. Left ventricular diastolic parameters are consistent with Grade I diastolic dysfunction (impaired relaxation). Normal left ventricular filling pressure. Right Ventricle: The right ventricular size is normal. No increase in right ventricular wall thickness. Right ventricular systolic function is normal. There is mildly elevated pulmonary artery systolic pressure. The tricuspid regurgitant velocity is 3.21  m/s, and with an assumed right atrial pressure of 3 mmHg, the estimated right ventricular systolic pressure is 41.6 mmHg. Left Atrium: Left atrial size was normal in size. Right Atrium: Right atrial size was normal in size. Pericardium: A small pericardial effusion is present. Mitral Valve: The mitral valve is normal in structure. No evidence of mitral valve regurgitation. No evidence of mitral valve stenosis. Tricuspid Valve: The tricuspid valve is normal in structure. Tricuspid valve regurgitation is mild . No evidence of tricuspid stenosis. Aortic Valve: The aortic valve is tricuspid. There is moderate calcification of the aortic valve. There is mild thickening of the aortic valve. Aortic valve regurgitation is not visualized. No aortic stenosis is present. Aortic valve mean gradient measures 3.5 mmHg. Aortic valve peak gradient measures 7.3 mmHg. Aortic valve area, by VTI measures 2.13 cm. Pulmonic Valve: The pulmonic valve was normal in structure. Pulmonic valve regurgitation is mild. No evidence of pulmonic stenosis. Aorta: The aortic root is normal in size and structure. Venous: The inferior vena cava is normal in size with greater than 50% respiratory variability, suggesting right atrial pressure of 3 mmHg. IAS/Shunts: No atrial level shunt detected by  color flow Doppler.  LEFT VENTRICLE PLAX 2D LVIDd:         4.40 cm     Diastology LVIDs:         2.30 cm     LV e' medial:    9.79 cm/s LV PW:         1.10 cm     LV E/e' medial:  7.7  LV IVS:        0.90 cm     LV e' lateral:   12.60 cm/s LVOT diam:     2.00 cm     LV E/e' lateral: 6.0 LV SV:         42 LV SV Index:   20 LVOT Area:     3.14 cm  LV Volumes (MOD) LV vol d, MOD A2C: 50.2 ml LV vol d, MOD A4C: 38.9 ml LV vol s, MOD A2C: 8.5 ml LV vol s, MOD A4C: 9.6 ml LV SV MOD A2C:     41.7 ml LV SV MOD A4C:     38.9 ml LV SV MOD BP:      36.7 ml RIGHT VENTRICLE             IVC RV S prime:     17.90 cm/s  IVC diam: 2.00 cm TAPSE (M-mode): 2.4 cm LEFT ATRIUM             Index        RIGHT ATRIUM           Index LA diam:        4.30 cm 2.01 cm/m   RA Area:     16.70 cm LA Vol (A2C):   44.6 ml 20.85 ml/m  RA Volume:   48.50 ml  22.68 ml/m LA Vol (A4C):   49.8 ml 23.28 ml/m LA Biplane Vol: 48.1 ml 22.49 ml/m  AORTIC VALVE                    PULMONIC VALVE AV Area (Vmax):    1.98 cm     PV Vmax:       0.89 m/s AV Area (Vmean):   1.98 cm     PV Vmean:      55.000 cm/s AV Area (VTI):     2.13 cm     PV VTI:        0.118 m AV Vmax:           135.50 cm/s  PV Peak grad:  3.1 mmHg AV Vmean:          88.550 cm/s  PV Mean grad:  1.0 mmHg AV VTI:            0.196 m AV Peak Grad:      7.3 mmHg AV Mean Grad:      3.5 mmHg LVOT Vmax:         85.25 cm/s LVOT Vmean:        55.800 cm/s LVOT VTI:          0.133 m LVOT/AV VTI ratio: 0.68  AORTA Ao Root diam: 2.90 cm Ao Asc diam:  3.10 cm MITRAL VALVE                TRICUSPID VALVE MV Area (PHT): 3.11 cm     TV Peak grad:   33.4 mmHg MV Decel Time: 244 msec     TV Mean grad:   22.0 mmHg MV E velocity: 75.30 cm/s   TV Vmax:  2.89 m/s MV A velocity: 122.00 cm/s  TV Vmean:       224.0 cm/s MV E/A ratio:  0.62         TV VTI:         0.78 msec                             TR Peak grad:   41.2 mmHg                             TR Vmax:        321.00 cm/s                              SHUNTS                             Systemic VTI:  0.13 m                             Systemic Diam: 2.00 cm Skeet Latch MD Electronically signed by Skeet Latch MD  Signature Date/Time: 03/02/2021/3:52:35 PM    Final         Scheduled Meds:  (feeding supplement) PROSource Plus  30 mL Oral BID BM   carvedilol  6.25 mg Oral BID WC   Chlorhexidine Gluconate Cloth  6 each Topical Daily   feeding supplement  1 Container Oral BID BM   feeding supplement  237 mL Oral Q24H   insulin aspart  0-15 Units Subcutaneous TID WC   insulin aspart  0-5 Units Subcutaneous QHS   mouth rinse  15 mL Mouth Rinse BID   multivitamin with minerals  1 tablet Oral Daily   pantoprazole  40 mg Oral Daily   Continuous Infusions:  ampicillin (OMNIPEN) IV 2 g (03/04/21 0416)     LOS: 12 days     Cordelia Poche, MD [facility] 03/04/2021, 9:44 AM  If 7PM-7AM, please contact night-coverage [email or web address]

## 2021-03-05 DIAGNOSIS — C801 Malignant (primary) neoplasm, unspecified: Secondary | ICD-10-CM | POA: Diagnosis not present

## 2021-03-05 DIAGNOSIS — D649 Anemia, unspecified: Secondary | ICD-10-CM | POA: Diagnosis not present

## 2021-03-05 DIAGNOSIS — E46 Unspecified protein-calorie malnutrition: Secondary | ICD-10-CM | POA: Diagnosis not present

## 2021-03-05 DIAGNOSIS — R7881 Bacteremia: Secondary | ICD-10-CM | POA: Diagnosis not present

## 2021-03-05 LAB — CBC
HCT: 23.2 % — ABNORMAL LOW (ref 36.0–46.0)
Hemoglobin: 7.3 g/dL — ABNORMAL LOW (ref 12.0–15.0)
MCH: 30 pg (ref 26.0–34.0)
MCHC: 31.5 g/dL (ref 30.0–36.0)
MCV: 95.5 fL (ref 80.0–100.0)
Platelets: 133 10*3/uL — ABNORMAL LOW (ref 150–400)
RBC: 2.43 MIL/uL — ABNORMAL LOW (ref 3.87–5.11)
RDW: 17.4 % — ABNORMAL HIGH (ref 11.5–15.5)
WBC: 22.2 10*3/uL — ABNORMAL HIGH (ref 4.0–10.5)
nRBC: 0 % (ref 0.0–0.2)

## 2021-03-05 LAB — GLUCOSE, CAPILLARY
Glucose-Capillary: 100 mg/dL — ABNORMAL HIGH (ref 70–99)
Glucose-Capillary: 105 mg/dL — ABNORMAL HIGH (ref 70–99)
Glucose-Capillary: 90 mg/dL (ref 70–99)
Glucose-Capillary: 82 mg/dL (ref 70–99)

## 2021-03-05 LAB — HEMOGLOBIN AND HEMATOCRIT, BLOOD
HCT: 23.6 % — ABNORMAL LOW (ref 36.0–46.0)
Hemoglobin: 7.4 g/dL — ABNORMAL LOW (ref 12.0–15.0)

## 2021-03-05 NOTE — Hospital Course (Addendum)
LAKHIA GENGLER is a 56 y.o. 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 Patient presented secondary to nausea/vomiting/diarrhea and abdominal pain. Hospitalization complicated by recurrent anemia. Fecal occult blood test negative. Patient has received 6 units of PRBC to date. Platelets improving. Now found to have E. Faecalis bacteremia.  On ampicillin per infectious disease.  At this time, working on placement, she's been difficult to place up until this point.  Oncology continues to follow, future decisions regarding her cancer treatment will be based on her performance status and nutritional status.  If these do not improve, oncology will likely recommend hospice.    See below for additional details discharge summary

## 2021-03-05 NOTE — Progress Notes (Signed)
[facility] for Infectious Disease   Reason for visit: Follow up on bacteremia  Interval History: repeat blood cultures remain no growth to date.   Physical Exam: Constitutional:  Vitals:   03/05/21 0548 03/05/21 0920  BP: 107/66 101/61  Pulse: 100 94  Resp: 16   Temp: 98.4 F (36.9 C)   SpO2: 100%    patient appears in NAD  Impression: Enterococcal bacteremia.   Plan: 1.  OPAT:  Diagnosis: Enterococcal bacteremia, possible line infection; poor functional status and inability to tolerate chemotherapy.    Culture Result: Enterococcus  Allergies  Allergen Reactions   Tramadol Other (See Comments)    Per patient increased HR and sweating    OPAT Orders Discharge antibiotics to be given via PICC line Discharge antibiotics: ampicillin 12 g/day continuous infusion Per pharmacy protocol yes Duration: 4 weeks End Date: 03/29/21  [facility] Care Per Protocol: yes  Home health RN for IV administration and teaching; PICC line care and labs.    Labs weekly while on IV antibiotics: _x_ CBC with differential __ BMP _x_ CMP __ CRP __ ESR __ Vancomycin trough __ CK  N/A__ Please pull PIC at completion of IV antibiotics __ Please leave PIC in place until doctor has seen patient or been notified  Fax weekly labs to [phone]  Clinic Follow Up Appt: As needed

## 2021-03-05 NOTE — Plan of Care (Signed)
  Problem: Activity: Goal: Risk for activity intolerance will decrease Outcome: Progressing   Problem: Coping: Goal: Level of anxiety will decrease Outcome: Progressing   

## 2021-03-05 NOTE — Progress Notes (Signed)
PROGRESS NOTE    Erin [name]  MCN:[id number] DOB: 02-18-1965 DOA: 02/20/2021 PCP: Pcp, No  Chief Complaint  Patient presents with   Nausea    Brief Narrative:  Erin [name] is Erin [name] 56 y.o. 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 Patient presented secondary to nausea/vomiting/diarrhea and abdominal pain. Hospitalization complicated by recurrent anemia. Fecal occult blood test negative. Patient has received 5 units of PRBC to date. Platelets improving. Now found to have E. Faecalis bacteremia.    Assessment & Plan:   Principal Problem:   Severe anemia Active Problems:   Bacteremia due to Enterococcus   Adenocarcinoma (HCC)   Metastatic adenocarcinoma of unknown origin (HCC)   Severe sepsis (HCC)   Somnolence   Thrombocytopenia (HCC)   Hypokalemia   Essential hypertension   Leukocytosis   Protein-calorie malnutrition, severe   Cancer related pain   Diabetes mellitus (HCC)   OSA (obstructive sleep apnea)   Obesity hypoventilation syndrome (HCC)   * Severe anemia- (present on admission) Secondary to chemotherapy.  Hemoglobin of 6.0 on admission. Patient has received 5 units of PRBC to date. Continue to trend, 7.3 today, follow and transfuse for <7  Bacteremia due to Enterococcus Infectious disease consulted. Likely GI source. Concern for possible endocarditis. Transthoracic Echocardiogram without evidence of vegetation. Repeat blood cultures (1/22) are no growth to date -ID recommendations: Ampicillin IV x 4 weeks.  Consider rechecking TTE and repeat blood cultures based on performance status and her candidacy for chemotherapy in the future.  Needs TEE and repeat cultures to assure clearance prior to restarting chemo to ensure port is not infected and no concerns for endocarditis.  Follow up with ID clinic if improvement in functional  status.  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 Patient follows with Dr. Benay Spice and is receiving chemotherapy. Recent imaging significant for progression of disease. Palliative care consulted (1/14) and decision made at that time was for full code/full scop treatment. Currently awaiting SNF, if performance status and nutritional status don't improve, she won't be candidate for chemo -would need to consider hospice.  Somnolence Oxycodone reduced. Possibly some effect from underlying bacteremia. Somnolence is improved.  Severe sepsis [facility]) Not present on admission. Unsure of source. Port site does not appear infected. Associated leukocytosis, fevers, hypotension. Blood cultures (1/21) positive for GPCs with BCID suggesting enterococcus faecalis. Started on ampicillin IV. -See problem, Bacteremia due to Enterococcus  Thrombocytopenia ([place]) Related to chemotherapy. Improving.  Hypokalemia- (present on admission) -Potassium supplementation as needed  Essential hypertension- (present on admission) Patient is on hydrochlorothiazide, Coreg and amlodipine as an outpatient. Coreg and amlodipine continued. Blood pressure mostly well controlled but with hypotension in setting of worsened anemia and sepsis. -Continue Coreg - hold HCTZ and amlodipine  Leukocytosis Likely secondary to bacteremia/infection. Initial improvement, but now trending back up -trend WBC curve, afebrile -Treat infection  Protein-calorie malnutrition, severe Patient continues to have poor oral intake which may affect her candidacy for future treatment of cancer.  Dietitian recommendations: Continue Boost Breeze TID and Prosource Plus BID. Continue to provide encouragement with eating and drinking as able.   Cancer related pain- (present on admission) -Continue Oxycodone prn  Diabetes mellitus  ([place]) Hemoglobin A1C of 7.1 -Continue SSI  Hypotension-resolved as of 03/03/2021 Likely related to combination of anemia, sepsis and iatrogenic from antihypertensives.  Given Erin [name] 500  mL NS bolus. Resolved.   DVT prophylaxis: SCD Code Status: full Family Communication: none Disposition:   Status is: Inpatient  Remains inpatient appropriate because: continued IV abx, need for intermittent tranfusion       Consultants:  Oncology Palliative care  Procedures:  none  Antimicrobials:  Anti-infectives (From admission, onward)    Start     Dose/Rate Route Frequency Ordered Stop   03/02/21 0800  ampicillin (OMNIPEN) 2 g in sodium chloride 0.9 % 100 mL IVPB  Status:  Discontinued        2 g 300 mL/hr over 20 Minutes Intravenous Every 4 hours 03/02/21 0619 03/02/21 0643   03/02/21 0730  ampicillin (OMNIPEN) 2 g in sodium chloride 0.9 % 100 mL IVPB        2 g 300 mL/hr over 20 Minutes Intravenous Every 4 hours 03/02/21 0643         Subjective: No new complaints  Objective: Vitals:   03/05/21 0630 03/05/21 0920 03/05/21 1203 03/05/21 1725  BP:  101/61 96/63 110/74  Pulse:  94 90 97  Resp:   20   Temp:   97.9 F (36.6 C)   TempSrc:   Oral   SpO2:   100%   Weight: 111.9 kg     Height:        Intake/Output Summary (Last 24 hours) at 03/05/2021 2004 Last data filed at 03/05/2021 1209 Gross per 24 hour  Intake 120 ml  Output 125 ml  Net -5 ml   Filed Weights   03/03/21 0409 03/04/21 0442 03/05/21 0630  Weight: 116.5 kg 113.9 kg 111.9 kg    Examination:  General exam: Appears calm and comfortable  Respiratory system: unlabored Cardiovascular system: RRR Gastrointestinal system: mildly diffusely TTP Central nervous system: Alert and oriented. No focal neurological deficits. Extremities: no LEE Skin: No rashes, lesions or ulcers Psychiatry: Judgement and insight appear normal. Mood & affect appropriate.     Data Reviewed: I have personally reviewed following  labs and imaging studies  CBC: Recent Labs  Lab 02/27/21 0321 02/28/21 0456 03/01/21 0447 03/02/21 0332 03/03/21 0610 03/04/21 0348 03/05/21 0500  WBC 15.8*   < > 17.2* 18.7* 22.2* 23.1* 22.2*  NEUTROABS 13.5*  --   --   --   --   --   --   HGB 7.2*   < > 7.4* 8.8* 7.8* 7.7* 7.3*  HCT 22.3*   < > 22.8* 29.9* 24.1* 23.8* 23.2*  MCV 93.3   < > 92.7 101.0* 92.7 94.4 95.5  PLT 49*   48*   < > 58* 81* 106* 122* 133*   < > = values in this interval not displayed.    Basic Metabolic Panel: Recent Labs  Lab 02/27/21 0321 02/28/21 0456 03/03/21 0610 03/04/21 0348  NA 134* 133* 130* 134*  K 3.3* 4.2 4.2 4.2  CL 104 104 102 105  CO2 22 20* 20* 21*  GLUCOSE 117* 126* 102* 97  BUN 20 26* 31* 28*  CREATININE 0.85 1.08* 0.95 0.87  CALCIUM 8.4* 8.7* 8.4* 8.3*  MG 2.0  --   --   --     GFR: Estimated Creatinine Clearance: 89.5 mL/min (by C-G formula based on SCr of 0.87 mg/dL).  Liver Function Tests: No results for input(s): AST, ALT, ALKPHOS, BILITOT, PROT, ALBUMIN in the last 168 hours.  CBG: Recent Labs  Lab 03/04/21 1643 03/04/21 2137 03/05/21 0719 03/05/21 1155 03/05/21 1645  GLUCAP 120* 115* 100* 105* 90  Recent Results (from the past 240 hour(s))  Urine Culture     Status: Abnormal   Collection Time: 02/24/21  9:49 AM   Specimen: Urine, Clean Catch  Result Value Ref Range Status   Specimen Description   Final    URINE, CLEAN CATCH Performed at [facility], [place] [address] [place], [place] [address]    Special Requests   Final    NONE Performed at [facility], [place] [address], [place], [place] [address]    Culture (Shellene Sweigert)  Final    <10,000 COLONIES/mL INSIGNIFICANT GROWTH Performed at [facility] [address] [place], [place] [address]    Report Status 02/26/2021 FINAL  Final  Culture, blood (routine x 2)     Status: Abnormal   Collection Time: 03/01/21 11:03 AM   Specimen: BLOOD  Result Value Ref  Range Status   Specimen Description   Final    BLOOD RIGHT ANTECUBITAL Performed at [place] [address] [place], [place] [address]    Special Requests   Final    BOTTLES DRAWN AEROBIC AND ANAEROBIC Blood Culture adequate volume Performed at [facility] [address], [place], [place] [address]    Culture  Setup Time   Final    GRAM POSITIVE COCCI IN BOTH AEROBIC AND ANAEROBIC BOTTLES CRITICAL RESULT CALLED TO, READ BACK BY AND VERIFIED WITH: M LILLISTON,[email or web address]  03/02/21 [place] Performed at [facility], [place] [address], [place], [place] [address]    Culture ENTEROCOCCUS FAECALIS (Jayan Raymundo)  Final   Report Status 03/04/2021 FINAL  Final   Organism ID, Bacteria ENTEROCOCCUS FAECALIS  Final      Susceptibility   Enterococcus faecalis - MIC*    AMPICILLIN <=2 SENSITIVE Sensitive     VANCOMYCIN 1 SENSITIVE Sensitive     GENTAMICIN SYNERGY SENSITIVE Sensitive     * ENTEROCOCCUS FAECALIS  Culture, blood (routine x 2)     Status: Abnormal   Collection Time: 03/01/21 11:03 AM   Specimen: BLOOD  Result Value Ref Range Status   Specimen Description   Final    BLOOD BLOOD LEFT HAND Performed at [facility], [place] [address] [place], [place] [address]    Special Requests   Final    BOTTLES DRAWN AEROBIC AND ANAEROBIC Blood Culture adequate volume Performed at [facility] [address] [place],  [address]    Culture  Setup Time   Final    GRAM POSITIVE COCCI IN BOTH AEROBIC AND ANAEROBIC BOTTLES CRITICAL VALUE NOTED.  VALUE IS CONSISTENT WITH PREVIOUSLY REPORTED AND CALLED VALUE.    Culture (Dannilynn Gallina)  Final    ENTEROCOCCUS FAECALIS SUSCEPTIBILITIES PERFORMED ON PREVIOUS CULTURE WITHIN THE LAST 5 DAYS. Performed at [facility], [place] [address], [place],  [address]    Report Status 03/04/2021 FINAL  Final  Blood Culture ID Panel ([place]ed)     Status: Abnormal   Collection Time:  03/01/21 11:03 AM  Result Value Ref Range Status   Enterococcus faecalis DETECTED (Latissa Frick) NOT DETECTED Final    Comment: CRITICAL RESULT CALLED TO, READ BACK BY AND VERIFIED WITH: M LILLISTON,[email or web address]  03/02/21 [place]    Enterococcus Faecium NOT DETECTED NOT DETECTED Final   Listeria monocytogenes NOT DETECTED NOT DETECTED Final   Staphylococcus species NOT DETECTED NOT DETECTED Final   Staphylococcus aureus (BCID) NOT DETECTED NOT DETECTED Final   Staphylococcus epidermidis NOT DETECTED NOT DETECTED Final   Staphylococcus  lugdunensis NOT DETECTED NOT DETECTED Final   Streptococcus species NOT DETECTED NOT DETECTED Final   Streptococcus agalactiae NOT DETECTED NOT DETECTED Final   Streptococcus pneumoniae NOT DETECTED NOT DETECTED Final   Streptococcus pyogenes NOT DETECTED NOT DETECTED Final   Ardie Mclennan.calcoaceticus-baumannii NOT DETECTED NOT DETECTED Final   Bacteroides fragilis NOT DETECTED NOT DETECTED Final   Enterobacterales NOT DETECTED NOT DETECTED Final   Enterobacter cloacae complex NOT DETECTED NOT DETECTED Final   Escherichia coli NOT DETECTED NOT DETECTED Final   Klebsiella aerogenes NOT DETECTED NOT DETECTED Final   Klebsiella oxytoca NOT DETECTED NOT DETECTED Final   Klebsiella pneumoniae NOT DETECTED NOT DETECTED Final   Proteus species NOT DETECTED NOT DETECTED Final   Salmonella species NOT DETECTED NOT DETECTED Final   Serratia marcescens NOT DETECTED NOT DETECTED Final   Haemophilus influenzae NOT DETECTED NOT DETECTED Final   Neisseria meningitidis NOT DETECTED NOT DETECTED Final   Pseudomonas aeruginosa NOT DETECTED NOT DETECTED Final   Stenotrophomonas maltophilia NOT DETECTED NOT DETECTED Final   Candida albicans NOT DETECTED NOT DETECTED Final   Candida auris NOT DETECTED NOT DETECTED Final   Candida glabrata NOT DETECTED NOT DETECTED Final   Candida krusei NOT DETECTED NOT DETECTED Final   Candida parapsilosis NOT DETECTED NOT DETECTED Final   Candida tropicalis NOT  DETECTED NOT DETECTED Final   Cryptococcus neoformans/gattii NOT DETECTED NOT DETECTED Final   Vancomycin resistance NOT DETECTED NOT DETECTED Final    Comment: Performed at [facility], [address], [place], [place] [address]  Culture, blood (routine x 2)     Status: None (Preliminary result)   Collection Time: 03/02/21  4:18 PM   Specimen: BLOOD  Result Value Ref Range Status   Specimen Description   Final    BLOOD LEFT ANTECUBITAL Performed at [facility] [address], [place], [place] [address]    Special Requests   Final    BOTTLES DRAWN AEROBIC ONLY Blood Culture adequate volume Performed at [facility] [address], [place], [place] [address]    Culture   Final    NO GROWTH 3 DAYS Performed at [facility], [place] [address] [place], [place] [address]    Report Status PENDING  Incomplete  Culture, blood (routine x 2)     Status: None (Preliminary result)   Collection Time: 03/02/21  4:18 PM   Specimen: BLOOD  Result Value Ref Range Status   Specimen Description   Final    BLOOD LEFT ANTECUBITAL Performed at [facility] [address] [place], [place] [address]    Special Requests   Final    BOTTLES DRAWN AEROBIC AND ANAEROBIC Blood Culture adequate volume Performed at [facility] [address], [place], [place] [address]    Culture   Final    NO GROWTH 3 DAYS Performed at [facility], [place] [address] [place], [place] [address]    Report Status PENDING  Incomplete         Radiology Studies: No results found.      Scheduled Meds:  (feeding supplement) PROSource Plus  30 mL Oral BID BM   carvedilol  6.25 mg Oral BID WC   Chlorhexidine Gluconate Cloth  6 each Topical Daily   feeding supplement  1 Container Oral BID BM   feeding supplement  237 mL Oral Q24H   insulin aspart  0-15 Units Subcutaneous TID WC   insulin aspart  0-5 Units Subcutaneous  QHS   mouth rinse  15 mL Mouth Rinse BID   multivitamin with minerals  1 tablet Oral Daily   pantoprazole  40 mg Oral Daily   Continuous Infusions:  ampicillin (OMNIPEN) IV 2 g (03/05/21 1729)     LOS: 13 days    Time spent: over 30 min    Fayrene Helper, MD [facility]   To contact the attending provider between 7A-7P or the covering provider during after hours 7P-7A, please log into the web site [email or web address] and access using universal [place] password for that web site. If you do not have the password, please call the hospital operator.  03/05/2021, 8:04 PM

## 2021-03-05 NOTE — Plan of Care (Signed)
°  Problem: Activity: Goal: Risk for activity intolerance will decrease 03/05/2021 1243 by Jerene Pitch, RN Outcome: Progressing 03/05/2021 1243 by Jerene Pitch, RN Outcome: Progressing   Problem: Coping: Goal: Level of anxiety will decrease 03/05/2021 1243 by Jerene Pitch, RN Outcome: Progressing 03/05/2021 1243 by Jerene Pitch, RN Outcome: Progressing

## 2021-03-06 ENCOUNTER — Inpatient Hospital Stay: Payer: 59 | Admitting: Nurse Practitioner

## 2021-03-06 ENCOUNTER — Inpatient Hospital Stay: Payer: 59

## 2021-03-06 ENCOUNTER — Inpatient Hospital Stay (HOSPITAL_COMMUNITY)
Admit: 2021-03-06 | Discharge: 2021-03-06 | Disposition: A | Discharge status: Home / Self Care | Payer: 59 | Attending: Family Medicine | Admitting: Family Medicine

## 2021-03-06 DIAGNOSIS — D649 Anemia, unspecified: Secondary | ICD-10-CM | POA: Diagnosis not present

## 2021-03-06 LAB — CBC WITH DIFFERENTIAL/PLATELET
Abs Immature Granulocytes: 0.46 10*3/uL — ABNORMAL HIGH (ref 0.00–0.07)
Basophils Absolute: 0 10*3/uL (ref 0.0–0.1)
Basophils Relative: 0 %
Eosinophils Absolute: 0 10*3/uL (ref 0.0–0.5)
Eosinophils Relative: 0 %
HCT: 23.2 % — ABNORMAL LOW (ref 36.0–46.0)
Hemoglobin: 7.2 g/dL — ABNORMAL LOW (ref 12.0–15.0)
Immature Granulocytes: 2 %
Lymphocytes Relative: 6 %
Lymphs Abs: 1.1 10*3/uL (ref 0.7–4.0)
MCH: 29.9 pg (ref 26.0–34.0)
MCHC: 31 g/dL (ref 30.0–36.0)
MCV: 96.3 fL (ref 80.0–100.0)
Monocytes Absolute: 1 10*3/uL (ref 0.1–1.0)
Monocytes Relative: 5 %
Neutro Abs: 18 10*3/uL — ABNORMAL HIGH (ref 1.7–7.7)
Neutrophils Relative %: 87 %
Platelets: 140 10*3/uL — ABNORMAL LOW (ref 150–400)
RBC: 2.41 MIL/uL — ABNORMAL LOW (ref 3.87–5.11)
RDW: 17.3 % — ABNORMAL HIGH (ref 11.5–15.5)
WBC: 20.7 10*3/uL — ABNORMAL HIGH (ref 4.0–10.5)
nRBC: 0 % (ref 0.0–0.2)

## 2021-03-06 LAB — COMPREHENSIVE METABOLIC PANEL
ALT: 9 U/L (ref 0–44)
AST: 19 U/L (ref 15–41)
Albumin: <1.5 g/dL — ABNORMAL LOW (ref 3.5–5.0)
Alkaline Phosphatase: 168 U/L — ABNORMAL HIGH (ref 38–126)
Anion gap: 7 (ref 5–15)
BUN: 25 mg/dL — ABNORMAL HIGH (ref 6–20)
CO2: 22 mmol/L (ref 22–32)
Calcium: 8.3 mg/dL — ABNORMAL LOW (ref 8.9–10.3)
Chloride: 106 mmol/L (ref 98–111)
Creatinine, Ser: 0.7 mg/dL (ref 0.44–1.00)
GFR, Estimated: >60 mL/min (ref >60)
Glucose, Bld: 90 mg/dL (ref 70–99)
Potassium: 4 mmol/L (ref 3.5–5.1)
Sodium: 135 mmol/L (ref 135–145)
Total Bilirubin: 1.2 mg/dL (ref 0.3–1.2)
Total Protein: 5.8 g/dL — ABNORMAL LOW (ref 6.5–8.1)

## 2021-03-06 LAB — PHOSPHORUS: Phosphorus: 3.1 mg/dL (ref 2.5–4.6)

## 2021-03-06 LAB — GLUCOSE, CAPILLARY
Glucose-Capillary: 101 mg/dL — ABNORMAL HIGH (ref 70–99)
Glucose-Capillary: 103 mg/dL — ABNORMAL HIGH (ref 70–99)
Glucose-Capillary: 97 mg/dL (ref 70–99)
Glucose-Capillary: 137 mg/dL — ABNORMAL HIGH (ref 70–99)

## 2021-03-06 LAB — PREPARE RBC (CROSSMATCH)

## 2021-03-06 LAB — MAGNESIUM: Magnesium: 2.2 mg/dL (ref 1.7–2.4)

## 2021-03-06 MED ORDER — SODIUM CHLORIDE 0.9% IV SOLUTION: Freq: Once | INTRAVENOUS | Status: DC

## 2021-03-06 NOTE — Progress Notes (Addendum)
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 -Bladder neck, urethra, cervical biopsies 12/17/2020 -MSS, tumor mutation burden 4, K-ras amplification, PD-L1 tumor proportion score-0 -[facility] -90% intestinal malignancy (colorectal adenocarcinoma 85%) small intestine adenocarcinoma less than 5%, gastroesophageal adenocarcinoma not excluded -12/05/2020 MRI of the pelvis-poorly marginated enhancing 3.7 x 2.8 x 3.0 cm mass centered at the urethra, solid avidly enhancing bilateral ovarian masses, poorly marginated enhancing 2.4 x 2.3 x 3.0 cm mass in the left uterine cervix, mild to moderate bilateral common iliac, bilateral external iliac, and bilateral inguinal lymphadenopathy, diffuse patchy confluent nodular replacement of the pelvic osseous structures. -12/13/2020 CEA 1864, CA125 29.2 -12/16/2020 CT abdomen/pelvis-rapidly increasing size of the left ovary with some adjacent ascites, enlarging masses elsewhere, masslike area of the cervix and potentially within a urethral diverticulum, well-circumscribed left adrenal lesion measuring 3.2 x 2.6 cm, heterogeneous pattern of subtle sclerosis and lucency in the spine. -12/16/2020 MRI of the lumbar spine-diffusely abnormal appearance of the bone marrow throughout the visualized lumbar spine and pelvis highly suspicious for diffuse osseous metastatic disease, retroperitoneal/iliac adenopathy with left larger than right adnexal masses. -12/17/2020 CT chest-no acute intrathoracic pathology -Upper endoscopy 12/22/2020-gastritis, gastric nodule biopsy-adenocarcinoma, poorly differentiated with signet ring morphology -Colonoscopy 12/22/2020-polyps removed from the ascending and sigmoid colon, extrinsic compression of the sigmoid colon-tubulovillous adenoma without high-grade dysplasia, tubular adenoma and hyperplastic polyps -12/27/2020 cycle #1 FOLFOX -01/10/2021  cycle #2 FOLFOX -01/27/2021 cycle 3 FOLFOX, oxaliplatin dose reduced -02/20/2021 CT abdomen/pelvis without contrast-increased size of the bilateral ovarian masses, interval diffuse bony metastatic disease, progressive metastatic bilateral pelvic and inguinal lymphadenopathy, increased size of the left adrenal metastasis, interval nodular densities at the lung bases suspicious for metastases, interval mild right hydronephrosis and hydroureter -02/22/2021 CEA 440 2.  Abdominal pain secondary #1 3.  Constipation 4.  Normocytic anemia 5.  Leukocytosis 6.  Protein calorie malnutrition 7.  Obstructive sleep apnea 8.  Diabetes mellitus 9.  Hypertension 10.  Possible pulmonary hypertension noted on CT chest 11.  Hospital admission 02/20/2021-intractable nausea and vomiting 12.  Right hydronephrosis and hydroureter 13.  Thrombocytopenia 14.  Low-grade fever 15.  E faecalis bacteremia  Erin [name] appears unchanged.  She is currently being treated for E faecalis bacteremia. Repeat blood cultures drawn 03/02/2021 are negative to date.  She is currently on antibiotics and is afebrile.  The bacteremia is likely due to cancer in her GI tract.  She continues to have persistent leukocytosis which may be a leukemoid reaction from her cancer as this predates this admission.  Overall, her leukocytosis is stable.  Would hold off on Port-A-Cath removal unless the repeat blood cultures are positive.  Continue supportive care and antibiotics.  Her platelet count continues to improve and is up to 140,000 this morning.  Pain currently controlled with oxycodone 5 mg every 4 hours as needed.  [name] understands that she is not a candidate for further chemotherapy unless her performance status and nutritional status improve.  Recommendations: 1.  Continue supportive care and antibiotics per ID/primary team.   2.  Monitor CBC 3.  Hold on Port-A-Cath removal unless repeat blood cultures are positive. 4.  Awaiting  SNF placement.  Unless her performance status and nutritional status improved, she is not a candidate for additional chemotherapy.  If these do not improve, we would recommend hospice. 5.  Consider stopping CBG checks  We will arrange for outpatient follow-up at the cancer center to reevaluate her candidacy for systemic  chemotherapy.  Please call medical oncology as needed.  [name], [name], [name], [name]  Erin [name] was interviewed and examined.  She was less confused when I saw her earlier this morning.  She otherwise appears stable.  She remains afebrile and the repeat blood culture is negative. Her performance status remains poor.  We will consider salvage chemotherapy if her performance status improves.  I will check on her 03/10/2021.  Please call oncology as needed.  [name], MD  SUBJECTIVE: Continues to have some mild confusion this morning.  Pain controlled.  No new complaints.  Oncology History  Gastric cancer ([place])  12/27/2020 Initial Diagnosis   Gastric cancer ([place])   12/27/2020 -  Chemotherapy   Patient is on Treatment Plan : GASTRIC FOLFOX q14d x 12 cycles      PHYSICAL EXAMINATION:  Vitals:   03/05/21 2114 03/06/21 0557  BP: 120/68 98/64  Pulse: 98 96  Resp: 17 16  Temp: 98 F (36.7 C) 98 F (36.7 C)  SpO2: 100% 96%   Filed Weights   03/03/21 0409 03/04/21 0442 03/05/21 0630  Weight: 116.5 kg 113.9 kg 111.9 kg    Intake/Output from previous day: 01/25 0701 - 01/26 0700 In: 300 [IV Piggyback:300] Out: 350 [Urine:350] HEENT: No thrush or bleeding Abdomen: Soft, tender in the left lower abdomen Vascular: No leg edema Skin: Faint petechial rash over her arms and legs  Port-A-Cath site without erythema  LABORATORY DATA:  I have reviewed the data as listed CMP Latest Ref Rng & Units 03/06/2021 03/04/2021 03/03/2021  Glucose 70 - 99 mg/dL 90 97 102(H)  BUN 6 - 20 mg/dL 25(H) 28(H) 31(H)  Creatinine 0.44 - 1.00 mg/dL 0.70 0.87 0.95  Sodium  135 - 145 mmol/L 135 134(L) 130(L)  Potassium 3.5 - 5.1 mmol/L 4.0 4.2 4.2  Chloride 98 - 111 mmol/L 106 105 102  CO2 22 - 32 mmol/L 22 21(L) 20(L)  Calcium 8.9 - 10.3 mg/dL 8.3(L) 8.3(L) 8.4(L)  Total Protein 6.5 - 8.1 g/dL 5.8(L) - -  Total Bilirubin 0.3 - 1.2 mg/dL 1.2 - -  Alkaline Phos 38 - 126 U/L 168(H) - -  AST 15 - 41 U/L 19 - -  ALT 0 - 44 U/L 9 - -    Lab Results  Component Value Date   WBC 20.7 (H) 03/06/2021   HGB 7.2 (L) 03/06/2021   HCT 23.2 (L) 03/06/2021   MCV 96.3 03/06/2021   PLT 140 (L) 03/06/2021   NEUTROABS 18.0 (H) 03/06/2021    Lab Results  Component Value Date   CEA1 440.0 (H) 02/22/2021   CEA 617.97 (H) 02/11/2021    CT Abdomen Pelvis Wo Contrast  Result Date: 02/20/2021 CLINICAL DATA:  Acute, non localized abdominal pain. Nausea, vomiting, diarrhea and fatigue for the past week. Receiving chemotherapy for gastric and ovarian cancer. EXAM: CT ABDOMEN AND PELVIS WITHOUT CONTRAST TECHNIQUE: Multidetector CT imaging of the abdomen and pelvis was performed following the standard protocol without IV contrast. RADIATION DOSE REDUCTION: This exam was performed according to the departmental dose-optimization program which includes automated exposure control, adjustment of the mA and/or kV according to patient size and/or use of iterative reconstruction technique. COMPARISON:  Chest CT dated 12/17/2020. Abdomen and pelvis CT dated 12/16/2020. FINDINGS: Lower chest: Mildly enlarged heart. Small amount of residual atelectasis/scarring in the right lower lobe, including a somewhat oval wedge-shaped area at the posterior right lung base which is significantly smaller. Interval visualization of a 5 mm nodular density at  the right lateral costophrenic angle on image number 20/6. There is a similar area more anteriorly on image number 22/6. There is also an interval 11 x 8 mm sub solid nodular density in the right middle lobe on image number 9/6. There is also an interval  nodular density in the left lower lobe with eccentric cavitation measuring 11 x 7 mm on image number 8/6. Hepatobiliary: No focal liver abnormality is seen. No gallstones, gallbladder wall thickening, or biliary dilatation. Pancreas: Unremarkable. No pancreatic ductal dilatation or surrounding inflammatory changes. Spleen: Normal in size without focal abnormality. Adrenals/Urinary Tract: An exophytic posterior left adrenal mass is again demonstrated. This measures 3.2 x 2.8 cm on image number 19/2, previously 3.2 x 2.6 cm. Stable normal appearing right adrenal gland. Interval mild dilatation of the right renal collecting system and ureter to the level of the ureterovesical junction with no obstructing stone or mass visualized. Stable normal appearing left kidney and ureter. The urinary bladder is poorly distended with mild to moderate diffuse wall thickening. Stomach/Bowel: A small hiatal hernia is unchanged. Unremarkable small bowel and colon. Normal appearing appendix. Vascular/Lymphatic: Bilateral enlarged pelvic and inguinal lymph nodes with progression. The largest is a left pelvic sidewall node with a short axis diameter of 17 mm on image number 75/2, previously 14 mm. Reproductive: A large left adnexal mass is again demonstrated with an interval increase in size. This measures 13.0 x 8.2 cm on image number 64/2, previously 10.8 x 8.8 cm. A right adnexal mass is again demonstrated, currently measuring 3.4 x 2.7 cm on image number 67/2, previously 2.9 cm in maximum diameter. Normal appearing uterus. Other: Small umbilical and infraumbilical hernias containing fat. Small left inguinal hernia containing fat. Musculoskeletal: Interval diffuse heterogeneous bone sclerosis. IMPRESSION: 1. Increased size of bilateral ovarian masses, especially on the left. 2. Interval diffuse bony metastatic disease. 3. Progressive metastatic bilateral pelvic and inguinal lymphadenopathy. 4. Increased size of a left adrenal  metastasis. 5. Interval nodular densities at the lung bases suspicious for metastases. 6. Interval mild right hydronephrosis and hydroureter to the level of the ureterovesical junction. This could be due to recent stone passage, obstruction by wall thickening of the bladder or a nonvisualized obstructing ureteral metastasis. Electronically Signed   By: Claudie Revering M.D.   On: 02/20/2021 18:28   CT HEAD WO CONTRAST (5MM)  Result Date: 02/22/2021 CLINICAL DATA:  Encephalopathy EXAM: CT HEAD WITHOUT CONTRAST TECHNIQUE: Contiguous axial images were obtained from the base of the skull through the vertex without intravenous contrast. RADIATION DOSE REDUCTION: This exam was performed according to the departmental dose-optimization program which includes automated exposure control, adjustment of the mA and/or kV according to patient size and/or use of iterative reconstruction technique. COMPARISON:  None. FINDINGS: Brain: There is no mass, hemorrhage or extra-axial collection. The size and configuration of the ventricles and extra-axial CSF spaces are normal. The brain parenchyma is normal, without acute or chronic infarction. Vascular: No abnormal hyperdensity of the major intracranial arteries or dural venous sinuses. No intracranial atherosclerosis. Skull: The visualized skull base, calvarium and extracranial soft tissues are normal. Sinuses/Orbits: No fluid levels or advanced mucosal thickening of the visualized paranasal sinuses. No mastoid or middle ear effusion. The orbits are normal. IMPRESSION: Normal head CT. Electronically Signed   By: Ulyses Jarred M.D.   On: 02/22/2021 22:14   MR BRAIN W WO CONTRAST  Result Date: 03/03/2021 CLINICAL DATA:  Metastatic disease evaluation EXAM: MRI HEAD WITHOUT AND WITH CONTRAST TECHNIQUE: Multiplanar, multiecho pulse  sequences of the brain and surrounding structures were obtained without and with intravenous contrast. CONTRAST:  30mL GADAVIST GADOBUTROL 1 MMOL/ML IV SOLN  COMPARISON:  No prior MRI, correlation is made with CT head 02/22/2021 FINDINGS: Evaluation is somewhat limited by motion artifact, particularly on postcontrast imaging. Brain: No restricted diffusion to suggest acute or subacute infarct. No acute hemorrhage, mass, mass effect, or midline shift. No foci of hemosiderin deposition to suggest remote hemorrhage. Scattered T2 hyperintense signal in the periventricular white matter, likely the sequela of mild chronic small vessel ischemic disease. No hydrocephalus or extra-axial collection. Vascular: Normal flow voids. Skull and upper cervical spine: No suspicious osseous lesions. Sinuses/Orbits: Negative. Other: None. IMPRESSION: Evaluation is limited by motion artifact, particularly on postcontrast imaging. Within this limitation no definite enhancing foci to suggest metastatic disease. No acute intracranial process. Electronically Signed   By: Merilyn Baba M.D.   On: 03/03/2021 23:47   DG CHEST PORT 1 VIEW  Result Date: 02/20/2021 CLINICAL DATA:  History of gastric and ovarian cancer. Fatigue, nausea, vomiting, diarrhea EXAM: PORTABLE CHEST 1 VIEW COMPARISON:  12/31/2020 FINDINGS: Right Port-A-Cath in place with the tip at the cavoatrial junction. Heart is borderline in size. Mild vascular congestion. No confluent airspace opacities, effusions or overt edema. No acute bony abnormality. IMPRESSION: Borderline heart size.  Mild vascular congestion. Electronically Signed   By: Rolm Baptise M.D.   On: 02/20/2021 21:30   ECHOCARDIOGRAM COMPLETE  Result Date: 03/02/2021    ECHOCARDIOGRAM REPORT   Patient Name:   Erin [name] Date of Exam: 03/02/2021 Medical Rec #:  [id number]           Height:       64.0 in Accession #:    [id number]          Weight:       246.7 lb Date of Birth:  [birth year]           BSA:          2.139 m Patient Age:    56 years            BP:           99/59 mmHg Patient Gender: F                   HR:           100 bpm. Exam Location:   Inpatient Procedure: 2D Echo, Cardiac Doppler and Color Doppler Indications:    BACTEREMIA  History:        Patient has no prior history of Echocardiogram examinations.                 Signs/Symptoms:Shortness of Breath and Bacteremia; Risk                 Factors:Hypertension and Diabetes. HLD.  Sonographer:    Beryle Beams Referring Phys: [id number] [place] T VU  Sonographer Comments: Patient is morbidly obese. PATIENT REFUSED SSN/ ALSO HOLDING BREATHE DURING STUDY. IMPRESSIONS  1. Left ventricular ejection fraction, by estimation, is 60 to 65%. The left ventricle has normal function. The left ventricle has no regional wall motion abnormalities. Left ventricular diastolic parameters are consistent with Grade I diastolic dysfunction (impaired relaxation).  2. Right ventricular systolic function is normal. The right ventricular size is normal. There is mildly elevated pulmonary artery systolic pressure.  3. A small pericardial effusion is present.  4. The mitral valve is normal in structure. No evidence of mitral valve regurgitation.  No evidence of mitral stenosis.  5. The aortic valve is tricuspid. There is moderate calcification of the aortic valve. There is mild thickening of the aortic valve. Aortic valve regurgitation is not visualized. No aortic stenosis is present.  6. The inferior vena cava is normal in size with greater than 50% respiratory variability, suggesting right atrial pressure of 3 mmHg. FINDINGS  Left Ventricle: Left ventricular ejection fraction, by estimation, is 60 to 65%. The left ventricle has normal function. The left ventricle has no regional wall motion abnormalities. The left ventricular internal cavity size was normal in size. There is  no left ventricular hypertrophy. Left ventricular diastolic parameters are consistent with Grade I diastolic dysfunction (impaired relaxation). Normal left ventricular filling pressure. Right Ventricle: The right ventricular size is normal. No increase in  right ventricular wall thickness. Right ventricular systolic function is normal. There is mildly elevated pulmonary artery systolic pressure. The tricuspid regurgitant velocity is 3.21  m/s, and with an assumed right atrial pressure of 3 mmHg, the estimated right ventricular systolic pressure is 06.0 mmHg. Left Atrium: Left atrial size was normal in size. Right Atrium: Right atrial size was normal in size. Pericardium: A small pericardial effusion is present. Mitral Valve: The mitral valve is normal in structure. No evidence of mitral valve regurgitation. No evidence of mitral valve stenosis. Tricuspid Valve: The tricuspid valve is normal in structure. Tricuspid valve regurgitation is mild . No evidence of tricuspid stenosis. Aortic Valve: The aortic valve is tricuspid. There is moderate calcification of the aortic valve. There is mild thickening of the aortic valve. Aortic valve regurgitation is not visualized. No aortic stenosis is present. Aortic valve mean gradient measures 3.5 mmHg. Aortic valve peak gradient measures 7.3 mmHg. Aortic valve area, by VTI measures 2.13 cm. Pulmonic Valve: The pulmonic valve was normal in structure. Pulmonic valve regurgitation is mild. No evidence of pulmonic stenosis. Aorta: The aortic root is normal in size and structure. Venous: The inferior vena cava is normal in size with greater than 50% respiratory variability, suggesting right atrial pressure of 3 mmHg. IAS/Shunts: No atrial level shunt detected by color flow Doppler.  LEFT VENTRICLE PLAX 2D LVIDd:         4.40 cm     Diastology LVIDs:         2.30 cm     LV e' medial:    9.79 cm/s LV PW:         1.10 cm     LV E/e' medial:  7.7 LV IVS:        0.90 cm     LV e' lateral:   12.60 cm/s LVOT diam:     2.00 cm     LV E/e' lateral: 6.0 LV SV:         42 LV SV Index:   20 LVOT Area:     3.14 cm  LV Volumes (MOD) LV vol d, MOD A2C: 50.2 ml LV vol d, MOD A4C: 38.9 ml LV vol s, MOD A2C: 8.5 ml LV vol s, MOD A4C: 9.6 ml LV SV MOD  A2C:     41.7 ml LV SV MOD A4C:     38.9 ml LV SV MOD BP:      36.7 ml RIGHT VENTRICLE             IVC RV S prime:     17.90 cm/s  IVC diam: 2.00 cm TAPSE (M-mode): 2.4 cm LEFT ATRIUM  Index        RIGHT ATRIUM           Index LA diam:        4.30 cm 2.01 cm/m   RA Area:     16.70 cm LA Vol (A2C):   44.6 ml 20.85 ml/m  RA Volume:   48.50 ml  22.68 ml/m LA Vol (A4C):   49.8 ml 23.28 ml/m LA Biplane Vol: 48.1 ml 22.49 ml/m  AORTIC VALVE                    PULMONIC VALVE AV Area (Vmax):    1.98 cm     PV Vmax:       0.89 m/s AV Area (Vmean):   1.98 cm     PV Vmean:      55.000 cm/s AV Area (VTI):     2.13 cm     PV VTI:        0.118 m AV Vmax:           135.50 cm/s  PV Peak grad:  3.1 mmHg AV Vmean:          88.550 cm/s  PV Mean grad:  1.0 mmHg AV VTI:            0.196 m AV Peak Grad:      7.3 mmHg AV Mean Grad:      3.5 mmHg LVOT Vmax:         85.25 cm/s LVOT Vmean:        55.800 cm/s LVOT VTI:          0.133 m LVOT/AV VTI ratio: 0.68  AORTA Ao Root diam: 2.90 cm Ao Asc diam:  3.10 cm MITRAL VALVE                TRICUSPID VALVE MV Area (PHT): 3.11 cm     TV Peak grad:   33.4 mmHg MV Decel Time: 244 msec     TV Mean grad:   22.0 mmHg MV E velocity: 75.30 cm/s   TV Vmax:        2.89 m/s MV A velocity: 122.00 cm/s  TV Vmean:       224.0 cm/s MV E/A ratio:  0.62         TV VTI:         0.78 msec                             TR Peak grad:   41.2 mmHg                             TR Vmax:        321.00 cm/s                              SHUNTS                             Systemic VTI:  0.13 m                             Systemic Diam: 2.00 cm Skeet Latch MD Electronically signed by Skeet Latch MD Signature Date/Time: 03/02/2021/3:52:35 PM    Final      Future  Appointments  Date Time Provider [place]  03/13/2021 12:45 PM [facility] PHLEBOTOMIST [facility] None  03/13/2021  1:00 PM [facility] FLUSH ROOM [facility] None  03/13/2021  1:15 PM Owens Shark, NP [facility] None       LOS: 14  days

## 2021-03-06 NOTE — Progress Notes (Signed)
EEG complete - results pending 

## 2021-03-06 NOTE — TOC Progression Note (Signed)
Transition of Care [facility]) - Progression Note    Patient Details  Name: Erin [name] MRN: [id number] Date of Birth: 1965-07-13  Transition of [facility]) CM/SW Contact  Purcell Mouton, RN Phone Number: 03/06/2021, 10:53 AM  Clinical Narrative:    Pt's insurance is not in Network with [employer]. No other SNF bed offers. Pt was declined.    Expected Discharge Plan: Home/Self Care Barriers to Discharge: No Barriers Identified  Expected Discharge Plan and Services Expected Discharge Plan: Home/Self Care       Living arrangements for the past 2 months: Single Family Home                                       Social Determinants of Health (SDOH) Interventions    Readmission Risk Interventions Readmission Risk Prevention Plan 01/06/2021  Transportation Screening Complete  Medication Review [employer]) Complete  PCP or Specialist appointment within 3-5 days of discharge Complete  HRI or Home Care Consult Complete  SW Recovery Care/Counseling Consult Complete  Palliative Care Screening Not [place] Complete  Some recent data might be hidden

## 2021-03-06 NOTE — Progress Notes (Signed)
PROGRESS NOTE    Erin [name]  FFM:[id number] DOB: [birth year] DOA: 02/20/2021 PCP: Pcp, No  Chief Complaint  Patient presents with   Nausea    Brief Narrative:  Erin [name] is Erin [name] 56 y.o. 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 Patient presented secondary to nausea/vomiting/diarrhea and abdominal pain. Hospitalization complicated by recurrent anemia. Fecal occult blood test negative. Patient has received 5 units of PRBC to date. Platelets improving. Now found to have E. Faecalis bacteremia.    Assessment & Plan:   Principal Problem:   Severe anemia Active Problems:   Bacteremia due to Enterococcus   Adenocarcinoma (HCC)   Metastatic adenocarcinoma of unknown origin (HCC)   Severe sepsis (HCC)   Thrombocytopenia (HCC)   Acute metabolic encephalopathy   Hypokalemia   Essential hypertension   Leukocytosis   Protein-calorie malnutrition, severe   Cancer related pain   Diabetes mellitus (HCC)   OSA (obstructive sleep apnea)   Obesity hypoventilation syndrome (HCC)   * Severe anemia- (present on admission) Secondary to chemotherapy.  Hemoglobin of 6.0 on admission. Patient has received 5 units of PRBC to date. Will transfuse additional unit pRBC today, follow  Bacteremia due to Enterococcus Infectious disease consulted. Likely GI source. Concern for possible endocarditis. Transthoracic Echocardiogram without evidence of vegetation. Repeat blood cultures (1/22) are no growth to date -ID recommendations: Ampicillin IV x 4 weeks.  Consider rechecking TTE and repeat blood cultures based on performance status and her candidacy for chemotherapy in the future.  Needs TEE and repeat cultures to assure clearance prior to restarting chemo to ensure port is not infected and no concerns for endocarditis.  Follow up with ID clinic if improvement in  functional status.  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 Patient follows with Dr. Benay Spice and is receiving chemotherapy. Recent imaging significant for progression of disease. Palliative care consulted (1/14) and decision made at that time was for full code/full scop treatment. Currently awaiting SNF, if performance status and nutritional status don't improve, she won't be candidate for chemo -would need to consider hospice.  Severe sepsis [facility]) Not present on admission. Unsure of source. Port site does not appear infected. Associated leukocytosis, fevers, hypotension. Blood cultures (1/21) positive for GPCs with BCID suggesting enterococcus faecalis. Started on ampicillin IV. -See problem, Bacteremia due to Enterococcus  Acute metabolic encephalopathy Oxycodone reduced. Possibly some effect from underlying bacteremia. Somnolence is improved. Still encephalopathic today - repeating some words MRI 1/23 with motion artifact, no acute intracranial process EEG ordered today, pending  Thrombocytopenia ([place]) Related to chemotherapy. Improving.  Hypokalemia- (present on admission) -Potassium supplementation as needed  Essential hypertension- (present on admission) Patient is on hydrochlorothiazide, Coreg and amlodipine as an outpatient. Coreg and amlodipine continued. Blood pressure mostly well controlled but with hypotension in setting of worsened anemia and sepsis. -Continue Coreg - hold HCTZ and amlodipine  Leukocytosis Likely secondary to bacteremia/infection. Initial improvement, but now trending back up -trend WBC curve, afebrile -Treat infection  Protein-calorie malnutrition, severe Patient continues to have poor oral intake which may affect her candidacy for future treatment of cancer.  Dietitian recommendations: Continue Boost Breeze TID and Prosource Plus  BID. Continue to provide encouragement with eating and drinking as able.   Cancer related pain- (present on admission) -Continue Oxycodone prn  Diabetes mellitus ([place]) Hemoglobin A1C of 7.1 -Continue  SSI  Hypotension-resolved as of 03/03/2021 Likely related to combination of anemia, sepsis and iatrogenic from antihypertensives.  Given Pallie Swigert 500 mL NS bolus. Resolved.   DVT prophylaxis: SCD Code Status: full Family Communication: none Disposition:   Status is: Inpatient  Remains inpatient appropriate because: continued IV abx, need for intermittent tranfusion       Consultants:  Oncology Palliative care  Procedures:  none  Antimicrobials:  Anti-infectives (From admission, onward)    Start     Dose/Rate Route Frequency Ordered Stop   03/02/21 0800  ampicillin (OMNIPEN) 2 g in sodium chloride 0.9 % 100 mL IVPB  Status:  Discontinued        2 g 300 mL/hr over 20 Minutes Intravenous Every 4 hours 03/02/21 0619 03/02/21 0643   03/02/21 0730  ampicillin (OMNIPEN) 2 g in sodium chloride 0.9 % 100 mL IVPB        2 g 300 mL/hr over 20 Minutes Intravenous Every 4 hours 03/02/21 0643         Subjective: No new complaints  Objective: Vitals:   03/05/21 2114 03/06/21 0557 03/06/21 0849 03/06/21 1318  BP: 120/68 98/64 109/68 107/68  Pulse: 98 96 92 93  Resp: 17 16  20   Temp: 98 F (36.7 C) 98 F (36.7 C)  98.3 F (36.8 C)  TempSrc: Oral Oral  Oral  SpO2: 100% 96%  100%  Weight:      Height:        Intake/Output Summary (Last 24 hours) at 03/06/2021 1828 Last data filed at 03/06/2021 1321 Gross per 24 hour  Intake 300 ml  Output 550 ml  Net -250 ml   Filed Weights   03/03/21 0409 03/04/21 0442 03/05/21 0630  Weight: 116.5 kg 113.9 kg 111.9 kg    Examination:  General: No acute distress. Cardiovascular: RRR Lungs: unlabored Abdomen: mild diffuse TTP Neurological: Alert and oriented 2, has trouble telling me why she's here. Moves all extremities 4.  Cranial nerves II through XII grossly intact. Skin: Warm and dry. No rashes or lesions. Extremities: No clubbing or cyanosis. No edema.   Data Reviewed: I have personally reviewed following labs and imaging studies  CBC: Recent Labs  Lab 03/02/21 0332 03/03/21 0610 03/04/21 0348 03/05/21 0500 03/05/21 2030 03/06/21 0239  WBC 18.7* 22.2* 23.1* 22.2*  --  20.7*  NEUTROABS  --   --   --   --   --  18.0*  HGB 8.8* 7.8* 7.7* 7.3* 7.4* 7.2*  HCT 29.9* 24.1* 23.8* 23.2* 23.6* 23.2*  MCV 101.0* 92.7 94.4 95.5  --  96.3  PLT 81* 106* 122* 133*  --  140*    Basic Metabolic Panel: Recent Labs  Lab 02/28/21 0456 03/03/21 0610 03/04/21 0348 03/06/21 0239  NA 133* 130* 134* 135  K 4.2 4.2 4.2 4.0  CL 104 102 105 106  CO2 20* 20* 21* 22  GLUCOSE 126* 102* 97 90  BUN 26* 31* 28* 25*  CREATININE 1.08* 0.95 0.87 0.70  CALCIUM 8.7* 8.4* 8.3* 8.3*  MG  --   --   --  2.2  PHOS  --   --   --  3.1    GFR: Estimated Creatinine Clearance: 97.3 mL/min (by C-G formula based on SCr of 0.7 mg/dL).  Liver Function Tests: Recent Labs  Lab 03/06/21 0239  AST 19  ALT 9  ALKPHOS 168*  BILITOT 1.2  PROT 5.8*  ALBUMIN <1.5*    CBG: Recent Labs  Lab 03/05/21  1645 03/05/21 2111 03/06/21 0736 03/06/21 1106 03/06/21 1650  GLUCAP 90 82 101* 103* 97     Recent Results (from the past 240 hour(s))  Culture, blood (routine x 2)     Status: Abnormal   Collection Time: 03/01/21 11:03 AM   Specimen: BLOOD  Result Value Ref Range Status   Specimen Description   Final    BLOOD RIGHT ANTECUBITAL Performed at [facility] [address], [place], [place] [address]    Special Requests   Final    BOTTLES DRAWN AEROBIC AND ANAEROBIC Blood Culture adequate volume Performed at [facility] [address] [place], [place] [address]    Culture  Setup Time   Final    GRAM POSITIVE COCCI IN BOTH AEROBIC AND ANAEROBIC BOTTLES CRITICAL RESULT CALLED TO,  READ BACK BY AND VERIFIED WITH: M LILLISTON,[email or web address]@[id number]  03/02/21 [place] Performed at [facility], [place] [address] [place], [place] [address]    Culture ENTEROCOCCUS FAECALIS (Taleshia Luff)  Final   Report Status 03/04/2021 FINAL  Final   Organism ID, Bacteria ENTEROCOCCUS FAECALIS  Final      Susceptibility   Enterococcus faecalis - MIC*    AMPICILLIN <=2 SENSITIVE Sensitive     VANCOMYCIN 1 SENSITIVE Sensitive     GENTAMICIN SYNERGY SENSITIVE Sensitive     * ENTEROCOCCUS FAECALIS  Culture, blood (routine x 2)     Status: Abnormal   Collection Time: 03/01/21 11:03 AM   Specimen: BLOOD  Result Value Ref Range Status   Specimen Description   Final    BLOOD BLOOD LEFT HAND Performed at [facility], [place] [address] [place], [place] [address]    Special Requests   Final    BOTTLES DRAWN AEROBIC AND ANAEROBIC Blood Culture adequate volume Performed at [facility] [address], [place], [place] [address]    Culture  Setup Time   Final    GRAM POSITIVE COCCI IN BOTH AEROBIC AND ANAEROBIC BOTTLES CRITICAL VALUE NOTED.  VALUE IS CONSISTENT WITH PREVIOUSLY REPORTED AND CALLED VALUE.    Culture (Modene Andy)  Final    ENTEROCOCCUS FAECALIS SUSCEPTIBILITIES PERFORMED ON PREVIOUS CULTURE WITHIN THE LAST 5 DAYS. Performed at [facility], [place] [address], [place], [place] [address]    Report Status 03/04/2021 FINAL  Final  Blood Culture ID Panel (Reflexed)     Status: Abnormal   Collection Time: 03/01/21 11:03 AM  Result Value Ref Range Status   Enterococcus faecalis DETECTED (Idrees Quam) NOT DETECTED Final    Comment: CRITICAL RESULT CALLED TO, READ BACK BY AND VERIFIED WITH: M LILLISTON,[id number]  03/02/21 [place]    Enterococcus Faecium NOT DETECTED NOT DETECTED Final   Listeria monocytogenes NOT DETECTED NOT DETECTED Final   Staphylococcus species NOT DETECTED NOT DETECTED Final   Staphylococcus aureus (BCID) NOT DETECTED NOT DETECTED Final   Staphylococcus  epidermidis NOT DETECTED NOT DETECTED Final   Staphylococcus lugdunensis NOT DETECTED NOT DETECTED Final   Streptococcus species NOT DETECTED NOT DETECTED Final   Streptococcus agalactiae NOT DETECTED NOT DETECTED Final   Streptococcus pneumoniae NOT DETECTED NOT DETECTED Final   Streptococcus pyogenes NOT DETECTED NOT DETECTED Final   Darrin Apodaca.calcoaceticus-baumannii NOT DETECTED NOT DETECTED Final   Bacteroides fragilis NOT DETECTED NOT DETECTED Final   Enterobacterales NOT DETECTED NOT DETECTED Final   Enterobacter cloacae complex NOT DETECTED NOT DETECTED Final   Escherichia coli NOT DETECTED NOT DETECTED Final   Klebsiella aerogenes NOT DETECTED NOT DETECTED Final  Klebsiella oxytoca NOT DETECTED NOT DETECTED Final   Klebsiella pneumoniae NOT DETECTED NOT DETECTED Final   Proteus species NOT DETECTED NOT DETECTED Final   Salmonella species NOT DETECTED NOT DETECTED Final   Serratia marcescens NOT DETECTED NOT DETECTED Final   Haemophilus influenzae NOT DETECTED NOT DETECTED Final   Neisseria meningitidis NOT DETECTED NOT DETECTED Final   Pseudomonas aeruginosa NOT DETECTED NOT DETECTED Final   Stenotrophomonas maltophilia NOT DETECTED NOT DETECTED Final   Candida albicans NOT DETECTED NOT DETECTED Final   Candida auris NOT DETECTED NOT DETECTED Final   Candida glabrata NOT DETECTED NOT DETECTED Final   Candida krusei NOT DETECTED NOT DETECTED Final   Candida parapsilosis NOT DETECTED NOT DETECTED Final   Candida tropicalis NOT DETECTED NOT DETECTED Final   Cryptococcus neoformans/gattii NOT DETECTED NOT DETECTED Final   Vancomycin resistance NOT DETECTED NOT DETECTED Final    Comment: Performed at [facility], [place] [address] [place], [place] [address]  Culture, blood (routine x 2)     Status: None (Preliminary result)   Collection Time: 03/02/21  4:18 PM   Specimen: BLOOD  Result Value Ref Range Status   Specimen Description   Final    BLOOD LEFT ANTECUBITAL Performed at  [facility] [address], [place], [place] [address]    Special Requests   Final    BOTTLES DRAWN AEROBIC ONLY Blood Culture adequate volume Performed at [facility] [address] [place], [place] [address]    Culture   Final    NO GROWTH 4 DAYS Performed at [facility], [place] [address], [place], [place] [address]    Report Status PENDING  Incomplete  Culture, blood (routine x 2)     Status: None (Preliminary result)   Collection Time: 03/02/21  4:18 PM   Specimen: BLOOD  Result Value Ref Range Status   Specimen Description   Final    BLOOD LEFT ANTECUBITAL Performed at [facility] [address], [place], [place] [address]    Special Requests   Final    BOTTLES DRAWN AEROBIC AND ANAEROBIC Blood Culture adequate volume Performed at [facility] [address], [place], [place] [address]    Culture   Final    NO GROWTH 4 DAYS Performed at [facility], [place] [address], [place], [place] [address]    Report Status PENDING  Incomplete         Radiology Studies: No results found.      Scheduled Meds:  (feeding supplement) PROSource Plus  30 mL Oral BID BM   sodium chloride   Intravenous Once   carvedilol  6.25 mg Oral BID WC   Chlorhexidine Gluconate Cloth  6 each Topical Daily   feeding supplement  1 Container Oral BID BM   feeding supplement  237 mL Oral Q24H   insulin aspart  0-15 Units Subcutaneous TID WC   insulin aspart  0-5 Units Subcutaneous QHS   mouth rinse  15 mL Mouth Rinse BID   multivitamin with minerals  1 tablet Oral Daily   pantoprazole  40 mg Oral Daily   Continuous Infusions:  ampicillin (OMNIPEN) IV 2 g (03/06/21 1343)     LOS: 14 days    Time spent: over 30 min    Fayrene Helper, MD [facility]   To contact the attending provider between 7A-7P or the covering provider during after hours 7P-7A, please log into the  web site  [email or web address] and access using universal [place] password for that web site. If you do not have the password, please call the hospital operator.  03/06/2021, 6:28 PM

## 2021-03-06 NOTE — Plan of Care (Signed)
  Problem: Pain Managment: Goal: General experience of comfort will improve Outcome: Progressing   

## 2021-03-07 DIAGNOSIS — G9341 Metabolic encephalopathy: Secondary | ICD-10-CM

## 2021-03-07 LAB — COMPREHENSIVE METABOLIC PANEL
ALT: 10 U/L (ref 0–44)
AST: 18 U/L (ref 15–41)
Albumin: <1.5 g/dL — ABNORMAL LOW (ref 3.5–5.0)
Alkaline Phosphatase: 158 U/L — ABNORMAL HIGH (ref 38–126)
Anion gap: 8 (ref 5–15)
BUN: 22 mg/dL — ABNORMAL HIGH (ref 6–20)
CO2: 21 mmol/L — ABNORMAL LOW (ref 22–32)
Calcium: 8.4 mg/dL — ABNORMAL LOW (ref 8.9–10.3)
Chloride: 105 mmol/L (ref 98–111)
Creatinine, Ser: 0.77 mg/dL (ref 0.44–1.00)
GFR, Estimated: >60 mL/min (ref >60)
Glucose, Bld: 100 mg/dL — ABNORMAL HIGH (ref 70–99)
Potassium: 4.1 mmol/L (ref 3.5–5.1)
Sodium: 134 mmol/L — ABNORMAL LOW (ref 135–145)
Total Bilirubin: 1.6 mg/dL — ABNORMAL HIGH (ref 0.3–1.2)
Total Protein: 5.9 g/dL — ABNORMAL LOW (ref 6.5–8.1)

## 2021-03-07 LAB — CBC WITH DIFFERENTIAL/PLATELET
Abs Immature Granulocytes: 0.48 10*3/uL — ABNORMAL HIGH (ref 0.00–0.07)
Basophils Absolute: 0 10*3/uL (ref 0.0–0.1)
Basophils Relative: 0 %
Eosinophils Absolute: 0 10*3/uL (ref 0.0–0.5)
Eosinophils Relative: 0 %
HCT: 25.1 % — ABNORMAL LOW (ref 36.0–46.0)
Hemoglobin: 7.7 g/dL — ABNORMAL LOW (ref 12.0–15.0)
Immature Granulocytes: 3 %
Lymphocytes Relative: 6 %
Lymphs Abs: 1.1 10*3/uL (ref 0.7–4.0)
MCH: 29.1 pg (ref 26.0–34.0)
MCHC: 30.7 g/dL (ref 30.0–36.0)
MCV: 94.7 fL (ref 80.0–100.0)
Monocytes Absolute: 1 10*3/uL (ref 0.1–1.0)
Monocytes Relative: 5 %
Neutro Abs: 15.9 10*3/uL — ABNORMAL HIGH (ref 1.7–7.7)
Neutrophils Relative %: 86 %
Platelets: 149 10*3/uL — ABNORMAL LOW (ref 150–400)
RBC: 2.65 MIL/uL — ABNORMAL LOW (ref 3.87–5.11)
RDW: 17.6 % — ABNORMAL HIGH (ref 11.5–15.5)
WBC: 18.5 10*3/uL — ABNORMAL HIGH (ref 4.0–10.5)
nRBC: 0.1 % (ref 0.0–0.2)

## 2021-03-07 LAB — CULTURE, BLOOD (ROUTINE X 2)
Culture: NO GROWTH
Culture: NO GROWTH
Special Requests: ADEQUATE
Special Requests: ADEQUATE

## 2021-03-07 LAB — GLUCOSE, CAPILLARY
Glucose-Capillary: 92 mg/dL (ref 70–99)
Glucose-Capillary: 164 mg/dL — ABNORMAL HIGH (ref 70–99)
Glucose-Capillary: 80 mg/dL (ref 70–99)
Glucose-Capillary: 83 mg/dL (ref 70–99)
Glucose-Capillary: 82 mg/dL (ref 70–99)

## 2021-03-07 LAB — PHOSPHORUS: Phosphorus: 3.2 mg/dL (ref 2.5–4.6)

## 2021-03-07 LAB — MAGNESIUM: Magnesium: 2 mg/dL (ref 1.7–2.4)

## 2021-03-07 MED ORDER — SENNOSIDES-DOCUSATE SODIUM 8.6-50 MG PO TABS
1.0000 | ORAL_TABLET | Freq: Every evening | ORAL | Status: DC | PRN
  Administered 2021-03-07 – 2021-03-10 (×2): 1 via ORAL
  Filled 2021-03-07 (×2): qty 1

## 2021-03-07 NOTE — Progress Notes (Signed)
PROGRESS NOTE    Erin [name]  TMA:[id number] DOB: 26-Sep-1965 DOA: 02/20/2021 PCP: Pcp, No  Chief Complaint  Patient presents with   Nausea    Brief Narrative:  Erin [name] is Erin [name] 56 y.o. 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 Patient presented secondary to nausea/vomiting/diarrhea and abdominal pain. Hospitalization complicated by recurrent anemia. Fecal occult blood test negative. Patient has received 5 units of PRBC to date. Platelets improving. Now found to have E. Faecalis bacteremia.    Assessment & Plan:   Principal Problem:   Severe anemia Active Problems:   Bacteremia due to Enterococcus   Adenocarcinoma (HCC)   Metastatic adenocarcinoma of unknown origin (HCC)   Severe sepsis (HCC)   Thrombocytopenia (HCC)   Acute metabolic encephalopathy   Hypokalemia   Essential hypertension   Leukocytosis   Protein-calorie malnutrition, severe   Cancer related pain   Diabetes mellitus (HCC)   OSA (obstructive sleep apnea)   Obesity hypoventilation syndrome (HCC)   * Severe anemia- (present on admission) Secondary to chemotherapy.  Hemoglobin of 6.0 on admission. Patient has received 6 units of PRBC to date.  Bacteremia due to Enterococcus Infectious disease consulted. Likely GI source. Concern for possible endocarditis. Transthoracic Echocardiogram without evidence of vegetation. Repeat blood cultures (1/22) are no growth to date -ID recommendations: Ampicillin IV x 4 weeks.  Consider rechecking TTE and repeat blood cultures based on performance status and her candidacy for chemotherapy in the future.  Needs TEE and repeat cultures to assure clearance prior to restarting chemo to ensure port is not infected and no concerns for endocarditis.  Follow up with ID clinic if improvement in functional status. 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 Patient follows with Erin [name] and is receiving chemotherapy. Recent imaging significant for progression of disease. Palliative care consulted (1/14) and decision made at that time was for full code/full scop treatment. Currently awaiting SNF, if performance status and nutritional status don't improve, she won't be candidate for chemo -would need to consider hospice.  Severe sepsis [facility]) Not present on admission. Unsure of source. Port site does not appear infected. Associated leukocytosis, fevers, hypotension. Blood cultures (1/21) positive for GPCs with BCID suggesting enterococcus faecalis. Started on ampicillin IV. -See problem, Bacteremia due to Enterococcus  Acute metabolic encephalopathy Oxycodone reduced. Possibly some effect from underlying bacteremia. Somnolence is improved. Still encephalopathic today - repeating some words MRI 1/23 with motion artifact, no acute intracranial process EEG with moderate diffuse encephalopathy, no seizures or epileptiform discharges  Thrombocytopenia ([place]) Related to chemotherapy. Improving.  Hypokalemia- (present on admission) -Potassium supplementation as needed  Essential hypertension- (present on admission) Patient is on hydrochlorothiazide, Coreg and amlodipine as an outpatient. Coreg and amlodipine continued. Blood pressure mostly well controlled but with hypotension in setting of worsened anemia and sepsis. -Continue Coreg - hold HCTZ and amlodipine  Leukocytosis Likely secondary to bacteremia/infection.  WBC still elevated, mild improvement - with fever, follow repeat cultures  Protein-calorie malnutrition, severe Patient continues to have poor oral intake which may affect her candidacy for future treatment of cancer.  Dietitian recommendations: Continue  Boost Breeze TID and Prosource Plus BID. Continue to provide encouragement with eating and drinking as able.   Cancer related pain- (present on admission) -Continue Oxycodone prn  Diabetes mellitus ([place])  Hemoglobin A1C of 7.1 -Continue SSI  Hypotension-resolved as of 03/03/2021 Likely related to combination of anemia, sepsis and iatrogenic from antihypertensives.  Given Kee Drudge 500 mL NS bolus. Resolved.   DVT prophylaxis: SCD Code Status: full Family Communication: none Disposition:   Status is: Inpatient  Remains inpatient appropriate because: continued IV abx, need for intermittent tranfusion       Consultants:  Oncology Palliative care  Procedures:  none  Antimicrobials:  Anti-infectives (From admission, onward)    Start     Dose/Rate Route Frequency Ordered Stop   03/02/21 0800  ampicillin (OMNIPEN) 2 g in sodium chloride 0.9 % 100 mL IVPB  Status:  Discontinued        2 g 300 mL/hr over 20 Minutes Intravenous Every 4 hours 03/02/21 0619 03/02/21 0643   03/02/21 0730  ampicillin (OMNIPEN) 2 g in sodium chloride 0.9 % 100 mL IVPB        2 g 300 mL/hr over 20 Minutes Intravenous Every 4 hours 03/02/21 0643         Subjective: C/o pain, asking about pain meds (discussed need to balance pain/side effects)  Objective: Vitals:   03/07/21 0320 03/07/21 0500 03/07/21 0906 03/07/21 1301  BP: (!) 110/24  125/75 114/72  Pulse: 98  98 94  Resp: 18   20  Temp: 99.3 F (37.4 C)   97.8 F (36.6 C)  TempSrc: Oral   Oral  SpO2: 97%   100%  Weight:  111.5 kg    Height:        Intake/Output Summary (Last 24 hours) at 03/07/2021 1439 Last data filed at 03/07/2021 0855 Gross per 24 hour  Intake 928 ml  Output 980 ml  Net -52 ml   Filed Weights   03/04/21 0442 03/05/21 0630 03/07/21 0500  Weight: 113.9 kg 111.9 kg 111.5 kg    Examination:  General: No acute distress. Cardiovascular:RRR Lungs: unlabored Abdomen: Soft, mildly ttp  Neurological: more oriented  today, moving all extremities Skin: Warm and dry. No rashes or lesions. Extremities: No clubbing or cyanosis. No edema.   Data Reviewed: I have personally reviewed following labs and imaging studies  CBC: Recent Labs  Lab 03/03/21 0610 03/04/21 0348 03/05/21 0500 03/05/21 2030 03/06/21 0239 03/07/21 0507  WBC 22.2* 23.1* 22.2*  --  20.7* 18.5*  NEUTROABS  --   --   --   --  18.0* 15.9*  HGB 7.8* 7.7* 7.3* 7.4* 7.2* 7.7*  HCT 24.1* 23.8* 23.2* 23.6* 23.2* 25.1*  MCV 92.7 94.4 95.5  --  96.3 94.7  PLT 106* 122* 133*  --  140* 149*    Basic Metabolic Panel: Recent Labs  Lab 03/03/21 0610 03/04/21 0348 03/06/21 0239 03/07/21 0507  NA 130* 134* 135 134*  K 4.2 4.2 4.0 4.1  CL 102 105 106 105  CO2 20* 21* 22 21*  GLUCOSE 102* 97 90 100*  BUN 31* 28* 25* 22*  CREATININE 0.95 0.87 0.70 0.77  CALCIUM 8.4* 8.3* 8.3* 8.4*  MG  --   --  2.2 2.0  PHOS  --   --  3.1 3.2    GFR: Estimated Creatinine Clearance: 97.1 mL/min (by C-G formula based on SCr of 0.77 mg/dL).  Liver Function Tests: Recent Labs  Lab 03/06/21 0239 03/07/21 0507  AST 19 18  ALT 9 10  ALKPHOS 168* 158*  BILITOT 1.2 1.6*  PROT 5.8* 5.9*  ALBUMIN <1.5* <1.5*    CBG: Recent Labs  Lab 03/06/21 1106  03/06/21 1650 03/06/21 2010 03/07/21 0753 03/07/21 1153  GLUCAP 103* 97 137* 92 164*     Recent Results (from the past 240 hour(s))  Culture, blood (routine x 2)     Status: Abnormal   Collection Time: 03/01/21 11:03 AM   Specimen: BLOOD  Result Value Ref Range Status   Specimen Description   Final    BLOOD RIGHT ANTECUBITAL Performed at [facility] [address] [place], [place] [address]    Special Requests   Final    BOTTLES DRAWN AEROBIC AND ANAEROBIC Blood Culture adequate volume Performed at [facility] [address] [place], [place] [address]    Culture  Setup Time   Final    GRAM POSITIVE COCCI IN BOTH AEROBIC AND ANAEROBIC  BOTTLES CRITICAL RESULT CALLED TO, READ BACK BY AND VERIFIED WITH: M LILLISTON,[email or web address]@[email or web address]  03/02/21 [place] Performed at [facility], [place] [address], [place], [place] [address]    Culture ENTEROCOCCUS FAECALIS (Erin [name])  Final   Report Status 03/04/2021 FINAL  Final   Organism ID, Bacteria ENTEROCOCCUS FAECALIS  Final      Susceptibility   Enterococcus faecalis - MIC*    AMPICILLIN <=2 SENSITIVE Sensitive     VANCOMYCIN 1 SENSITIVE Sensitive     GENTAMICIN SYNERGY SENSITIVE Sensitive     * ENTEROCOCCUS FAECALIS  Culture, blood (routine x 2)     Status: Abnormal   Collection Time: 03/01/21 11:03 AM   Specimen: BLOOD  Result Value Ref Range Status   Specimen Description   Final    BLOOD BLOOD LEFT HAND Performed at [facility], [place] [address] [place], [place] [address]    Special Requests   Final    BOTTLES DRAWN AEROBIC AND ANAEROBIC Blood Culture adequate volume Performed at [facility] [address], [place], [place] [address]    Culture  Setup Time   Final    GRAM POSITIVE COCCI IN BOTH AEROBIC AND ANAEROBIC BOTTLES CRITICAL VALUE NOTED.  VALUE IS CONSISTENT WITH PREVIOUSLY REPORTED AND CALLED VALUE.    Culture (Dontrell Stuck)  Final    ENTEROCOCCUS FAECALIS SUSCEPTIBILITIES PERFORMED ON PREVIOUS CULTURE WITHIN THE LAST 5 DAYS. Performed at [facility], [place] [address] [place],  [address]    Report Status 03/04/2021 FINAL  Final  Blood Culture ID Panel (Reflexed)     Status: Abnormal   Collection Time: 03/01/21 11:03 AM  Result Value Ref Range Status   Enterococcus faecalis DETECTED (Erin [name]) NOT DETECTED Final    Comment: CRITICAL RESULT CALLED TO, READ BACK BY AND VERIFIED WITH: M LILLISTON,[email or web address]  03/02/21 [place]    Enterococcus Faecium NOT DETECTED NOT DETECTED Final   Listeria monocytogenes NOT DETECTED NOT DETECTED Final   Staphylococcus species NOT DETECTED NOT DETECTED Final   Staphylococcus aureus (BCID) NOT DETECTED NOT  DETECTED Final   Staphylococcus epidermidis NOT DETECTED NOT DETECTED Final   Staphylococcus lugdunensis NOT DETECTED NOT DETECTED Final   Streptococcus species NOT DETECTED NOT DETECTED Final   Streptococcus agalactiae NOT DETECTED NOT DETECTED Final   Streptococcus pneumoniae NOT DETECTED NOT DETECTED Final   Streptococcus pyogenes NOT DETECTED NOT DETECTED Final   Erin [name].calcoaceticus-baumannii NOT DETECTED NOT DETECTED Final   Bacteroides fragilis NOT DETECTED NOT DETECTED Final   Enterobacterales NOT DETECTED NOT DETECTED Final   Enterobacter cloacae complex NOT DETECTED NOT DETECTED Final   Escherichia coli NOT DETECTED NOT DETECTED Final   Klebsiella aerogenes NOT DETECTED NOT DETECTED Final  Klebsiella oxytoca NOT DETECTED NOT DETECTED Final   Klebsiella pneumoniae NOT DETECTED NOT DETECTED Final   Proteus species NOT DETECTED NOT DETECTED Final   Salmonella species NOT DETECTED NOT DETECTED Final   Serratia marcescens NOT DETECTED NOT DETECTED Final   Haemophilus influenzae NOT DETECTED NOT DETECTED Final   Neisseria meningitidis NOT DETECTED NOT DETECTED Final   Pseudomonas aeruginosa NOT DETECTED NOT DETECTED Final   Stenotrophomonas maltophilia NOT DETECTED NOT DETECTED Final   Candida albicans NOT DETECTED NOT DETECTED Final   Candida auris NOT DETECTED NOT DETECTED Final   Candida glabrata NOT DETECTED NOT DETECTED Final   Candida krusei NOT DETECTED NOT DETECTED Final   Candida parapsilosis NOT DETECTED NOT DETECTED Final   Candida tropicalis NOT DETECTED NOT DETECTED Final   Cryptococcus neoformans/gattii NOT DETECTED NOT DETECTED Final   Vancomycin resistance NOT DETECTED NOT DETECTED Final    Comment: Performed at [facility], [place] [address], [place], [place] [address]  Culture, blood (routine x 2)     Status: None   Collection Time: 03/02/21  4:18 PM   Specimen: BLOOD  Result Value Ref Range Status   Specimen Description   Final    BLOOD LEFT  ANTECUBITAL Performed at [facility] [address], [place], [place] [address]    Special Requests   Final    BOTTLES DRAWN AEROBIC ONLY Blood Culture adequate volume Performed at [facility] [address], [place], [place] [address]    Culture   Final    NO GROWTH 5 DAYS Performed at [facility], [place] [address] [place], [place] [address]    Report Status Mar 20, 2021 FINAL  Final  Culture, blood (routine x 2)     Status: None   Collection Time: 03/02/21  4:18 PM   Specimen: BLOOD  Result Value Ref Range Status   Specimen Description   Final    BLOOD LEFT ANTECUBITAL Performed at [facility] [address], [place], [place] [address]    Special Requests   Final    BOTTLES DRAWN AEROBIC AND ANAEROBIC Blood Culture adequate volume Performed at [facility] [address], [place], [place] [address]    Culture   Final    NO GROWTH 5 DAYS Performed at [facility], [place] [address], [place], [place] [address]    Report Status 03-20-2021 FINAL  Final         Radiology Studies: EEG adult  Result Date: 03/20/21 Erin Havens, MD     03-20-2021  8:48 AM Patient Name: Erin [name] MRN: [id number] Epilepsy Attending: Lora [name] Referring Physician/Provider: Elodia Florence., MD Date: 03/06/2021 Duration: 21.53 mins Patient history: 56 year old female with altered mental status.  EEG to evaluate for seizure. Level of alertness: Awake, asleep AEDs during EEG study: None Technical aspects: This EEG study was done with scalp electrodes positioned according to the 10-20 International system of electrode placement. Electrical activity was acquired at [place] sampling rate of 500Hz  and reviewed with Ailany Koren high frequency filter of 70Hz  and Onita Pfluger low frequency filter of 1Hz . EEG data were recorded continuously and digitally stored. Description: The posterior dominant rhythm consists of 7 Hz  activity of moderate voltage (25-35 uV) seen predominantly in posterior head regions, symmetric and reactive to eye opening and eye closing. Sleep was characterized by sleep spindles (12 to 14 Hz), maximal frontocentral region.  EEG showed continuous generalized 3 to 6  Hz theta-delta slowing. Hyperventilation and photic stimulation were not performed.   ABNORMALITY - Continuous slow, generalized - Background slow IMPRESSION: This study is suggestive of moderate diffuse encephalopathy, nonspecific etiology. No seizures or epileptiform discharges were seen throughout the recording. Erin [name]        Scheduled Meds:  (feeding supplement) PROSource Plus  30 mL Oral BID BM   sodium chloride   Intravenous Once   carvedilol  6.25 mg Oral BID WC   Chlorhexidine Gluconate Cloth  6 each Topical Daily   feeding supplement  1 Container Oral BID BM   feeding supplement  237 mL Oral Q24H   insulin aspart  0-15 Units Subcutaneous TID WC   insulin aspart  0-5 Units Subcutaneous QHS   mouth rinse  15 mL Mouth Rinse BID   multivitamin with minerals  1 tablet Oral Daily   pantoprazole  40 mg Oral Daily   Continuous Infusions:  ampicillin (OMNIPEN) IV 2 g (03/07/21 1416)     LOS: 15 days    Time spent: over 30 min    Fayrene Helper, MD [facility]   To contact the attending provider between 7A-7P or the covering provider during after hours 7P-7A, please log into the web site [email or web address] and access using universal [place] password for that web site. If you do not have the password, please call the hospital operator.  03/07/2021, 2:39 PM

## 2021-03-07 NOTE — Progress Notes (Signed)
Physical Therapy Treatment Patient Details Name: Erin [name] MRN: [id number] DOB: November 21, 1965 Today's Date: 03/07/2021   History of Present Illness Erin [name] is a 56 y.o. 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 Patient presented secondary to nausea/vomiting/diarrhea and abdominal pain. Hospitalization complicated by recurrent anemia. Fecal occult blood test negative. Patient has received 5 units of PRBC to date. Platelets improving. Now found to have E. Faecalis bacteremia.    PT Comments    General Comments: AxO x 3 improved cognition but c/o MAX weakness/fatigue stating "I can't even move my arms like I want".  "I have NO energy".  Discussed eating and drinking but pt stated, "I have no appitite".  Discussed drinking her Breezes TID. "No I can't stand those".  Discussed IV antibiotics will help with her infection but enforced "you need to eat and drink those Breezes to feul your body". Assisted OOB required increased time and + 2 assist. General bed mobility comments: required increased assist this session and total Assist pulling bed pad to complete scooting.  Once upright and centered, pt was able to static sit EOB at Supervision level.General Gait Details: too weak and fatigued to attempt amb after using BSC. Positioned in recliner to comfort.    Recommendations for follow up therapy are one component of a multi-disciplinary discharge planning process, led by the attending physician.  Recommendations may be updated based on patient status, additional functional criteria and insurance authorization.  Follow Up Recommendations  Skilled nursing-short term rehab (<3 hours/day)     Assistance Recommended at Discharge Frequent or constant Supervision/Assistance  Patient can return home with the following A lot of help with walking and/or transfers;A lot  of help with bathing/dressing/bathroom;Help with stairs or ramp for entrance;Two people to help with walking and/or transfers;Two people to help with bathing/dressing/bathroom;Assist for transportation;Assistance with cooking/housework   Equipment Recommendations  None recommended by PT    Recommendations for Other Services       Precautions / Restrictions Precautions Precautions: Fall     Mobility  Bed Mobility Overal bed mobility: Needs Assistance Bed Mobility: Supine to Sit     Supine to sit: Max assist     General bed mobility comments: required increased assist this session and total Assist pulling bed pad to complete scooting.  Once upright and centered, pt was able to static sit EOB at Supervision level.    Transfers Overall transfer level: Needs assistance Equipment used: None Transfers: Bed to chair/wheelchair/BSC Sit to Stand: Max assist, +2 physical assistance, +2 safety/equipment           General transfer comment: required increased asisst this session to rise from elevated bed and complete 1/4 pivot turn to [facility].  Poor forward posture and MAX c/o weakness/fatigue.  Pt was able to static stand a good min while I assisted with peri care/hygiene after having a small BM.    Ambulation/Gait               General Gait Details: too weak and fatigued to attempt amb after using BSC.   Stairs             Wheelchair Mobility    Modified Rankin (Stroke Patients Only)       Balance  Cognition Arousal/Alertness: Awake/alert Behavior During Therapy: Flat affect Overall Cognitive Status: Impaired/Different from baseline                                 General Comments: AxO x 3 improved cognition but c/o MAX weakness/fatigue stating "I can't even move my arms like I want".  "I have NO energy".  Discussed eating and drinking but pt stated, "I have no appitite".  Discussed  drinking her Breezes TID. "No I can't stand those".  Discussed IV antibiotics will help with her infection but enforced "you need to eat and drink those Breezes to feul your body".        Exercises      General Comments        Pertinent Vitals/Pain Pain Assessment Pain Assessment: Faces Faces Pain Scale: Hurts even more Pain Location: lower pelvic/center/ABD Pain Descriptors / Indicators: Aching, Cramping Pain Intervention(s): Monitored during session    Home Living                          Prior Function            PT Goals (current goals can now be found in the care plan section) Progress towards PT goals: Progressing toward goals    Frequency    Min 2X/week      PT Plan Frequency needs to be updated    Co-evaluation              AM-PAC PT "6 Clicks" Mobility   Outcome Measure  Help needed turning from your back to your side while in a flat bed without using bedrails?: A Lot Help needed moving from lying on your back to sitting on the side of a flat bed without using bedrails?: A Lot Help needed moving to and from a bed to a chair (including a wheelchair)?: A Lot Help needed standing up from a chair using your arms (e.g., wheelchair or bedside chair)?: A Lot Help needed to walk in hospital room?: Total Help needed climbing 3-5 steps with a railing? : Total 6 Click Score: 10    End of Session Equipment Utilized During Treatment: Gait belt Activity Tolerance: Patient limited by fatigue Patient left: in chair;with call bell/phone within reach;with chair alarm set Nurse Communication: Mobility status PT Visit Diagnosis: Difficulty in walking, not elsewhere classified (R26.2);Muscle weakness (generalized) (M62.81)     Time: 2505-3976 PT Time Calculation (min) (ACUTE ONLY): 36 min  Charges:  $Therapeutic Activity: 23-37 mins                    Rica Koyanagi  PTA Acute  Rehabilitation Services Pager      [phone] Office       [phone]

## 2021-03-07 NOTE — Procedures (Signed)
Patient Name: Erin [name]  MRN: [id number]  Epilepsy Attending: Lora Havens  Referring Physician/Provider: Elodia Florence., MD Date: 03/06/2021 Duration: 21.53 mins  Patient history: 56 year old female with altered mental status.  EEG to evaluate for seizure.  Level of alertness: Awake, asleep  AEDs during EEG study: None  Technical aspects: This EEG study was done with scalp electrodes positioned according to the 10-20 International system of electrode placement. Electrical activity was acquired at a sampling rate of 500Hz  and reviewed with a high frequency filter of 70Hz  and a low frequency filter of 1Hz . EEG data were recorded continuously and digitally stored.   Description: The posterior dominant rhythm consists of 7 Hz activity of moderate voltage (25-35 uV) seen predominantly in posterior head regions, symmetric and reactive to eye opening and eye closing. Sleep was characterized by sleep spindles (12 to 14 Hz), maximal frontocentral region.  EEG showed continuous generalized 3 to 6 Hz theta-delta slowing. Hyperventilation and photic stimulation were not performed.     ABNORMALITY - Continuous slow, generalized - Background slow  IMPRESSION: This study is suggestive of moderate diffuse encephalopathy, nonspecific etiology. No seizures or epileptiform discharges were seen throughout the recording.  [name] [name]

## 2021-03-08 LAB — CBC WITH DIFFERENTIAL/PLATELET
Abs Immature Granulocytes: 0.5 10*3/uL — ABNORMAL HIGH (ref 0.00–0.07)
Basophils Absolute: 0.1 10*3/uL (ref 0.0–0.1)
Basophils Relative: 0 %
Eosinophils Absolute: 0 10*3/uL (ref 0.0–0.5)
Eosinophils Relative: 0 %
HCT: 25.3 % — ABNORMAL LOW (ref 36.0–46.0)
Hemoglobin: 7.9 g/dL — ABNORMAL LOW (ref 12.0–15.0)
Immature Granulocytes: 3 %
Lymphocytes Relative: 6 %
Lymphs Abs: 1 10*3/uL (ref 0.7–4.0)
MCH: 29.8 pg (ref 26.0–34.0)
MCHC: 31.2 g/dL (ref 30.0–36.0)
MCV: 95.5 fL (ref 80.0–100.0)
Monocytes Absolute: 1 10*3/uL (ref 0.1–1.0)
Monocytes Relative: 6 %
Neutro Abs: 14.9 10*3/uL — ABNORMAL HIGH (ref 1.7–7.7)
Neutrophils Relative %: 85 %
Platelets: 159 10*3/uL (ref 150–400)
RBC: 2.65 MIL/uL — ABNORMAL LOW (ref 3.87–5.11)
RDW: 17.5 % — ABNORMAL HIGH (ref 11.5–15.5)
WBC: 17.4 10*3/uL — ABNORMAL HIGH (ref 4.0–10.5)
nRBC: 0.2 % (ref 0.0–0.2)

## 2021-03-08 LAB — COMPREHENSIVE METABOLIC PANEL
ALT: 10 U/L (ref 0–44)
AST: 18 U/L (ref 15–41)
Albumin: <1.5 g/dL — ABNORMAL LOW (ref 3.5–5.0)
Alkaline Phosphatase: 169 U/L — ABNORMAL HIGH (ref 38–126)
Anion gap: 8 (ref 5–15)
BUN: 17 mg/dL (ref 6–20)
CO2: 20 mmol/L — ABNORMAL LOW (ref 22–32)
Calcium: 8.5 mg/dL — ABNORMAL LOW (ref 8.9–10.3)
Chloride: 106 mmol/L (ref 98–111)
Creatinine, Ser: 0.72 mg/dL (ref 0.44–1.00)
GFR, Estimated: >60 mL/min (ref >60)
Glucose, Bld: 89 mg/dL (ref 70–99)
Potassium: 4.1 mmol/L (ref 3.5–5.1)
Sodium: 134 mmol/L — ABNORMAL LOW (ref 135–145)
Total Bilirubin: 1.6 mg/dL — ABNORMAL HIGH (ref 0.3–1.2)
Total Protein: 5.9 g/dL — ABNORMAL LOW (ref 6.5–8.1)

## 2021-03-08 LAB — PHOSPHORUS: Phosphorus: 2.7 mg/dL (ref 2.5–4.6)

## 2021-03-08 LAB — MAGNESIUM: Magnesium: 1.9 mg/dL (ref 1.7–2.4)

## 2021-03-08 LAB — GLUCOSE, CAPILLARY
Glucose-Capillary: 77 mg/dL (ref 70–99)
Glucose-Capillary: 107 mg/dL — ABNORMAL HIGH (ref 70–99)
Glucose-Capillary: 151 mg/dL — ABNORMAL HIGH (ref 70–99)
Glucose-Capillary: 87 mg/dL (ref 70–99)

## 2021-03-08 LAB — TROPONIN I (HIGH SENSITIVITY): Troponin I (High Sensitivity): 6 ng/L (ref <18)

## 2021-03-08 MED ORDER — BENZONATATE 100 MG PO CAPS
100.0000 mg | ORAL_CAPSULE | Freq: Three times a day (TID) | ORAL | Status: DC | PRN
  Administered 2021-03-08 – 2021-03-13 (×10): 100 mg via ORAL
  Filled 2021-03-08 (×10): qty 1

## 2021-03-08 MED ORDER — MORPHINE SULFATE (PF) 2 MG/ML IV SOLN
2.0000 mg | Freq: Once | INTRAVENOUS | Status: AC
  Administered 2021-03-08: 2 mg via INTRAVENOUS
  Filled 2021-03-08: qty 1

## 2021-03-08 NOTE — Progress Notes (Signed)
PROGRESS NOTE    Erin [name]  QIO:[id number] DOB: 05/20/1965 DOA: 02/20/2021 PCP: Pcp, No  Chief Complaint  Patient presents with   Nausea    Brief Narrative:  Erin [name] is Erin [name] 56 y.o. 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 Patient presented secondary to nausea/vomiting/diarrhea and abdominal pain. Hospitalization complicated by recurrent anemia. Fecal occult blood test negative. Patient has received 5 units of PRBC to date. Platelets improving. Now found to have E. Faecalis bacteremia.    Assessment & Plan:   Principal Problem:   Severe anemia Active Problems:   Bacteremia due to Enterococcus   Adenocarcinoma (HCC)   Metastatic adenocarcinoma of unknown origin (HCC)   Severe sepsis (HCC)   Thrombocytopenia (HCC)   Acute metabolic encephalopathy   Hypokalemia   Essential hypertension   Leukocytosis   Protein-calorie malnutrition, severe   Cancer related pain   Diabetes mellitus (HCC)   OSA (obstructive sleep apnea)   Obesity hypoventilation syndrome (HCC)   * Severe anemia- (present on admission) Secondary to chemotherapy.  Hemoglobin of 6.0 on admission. Patient has received 6 units of PRBC to date.  Bacteremia due to Enterococcus Infectious disease consulted. Likely GI source. Concern for possible endocarditis. Transthoracic Echocardiogram without evidence of vegetation. Repeat blood cultures (1/22) are no growth to date -ID recommendations: Ampicillin IV x 4 weeks.  Consider rechecking TTE and repeat blood cultures based on performance status and her candidacy for chemotherapy in the future.  Needs TEE and repeat cultures to assure clearance prior to restarting chemo to ensure port is not infected and no concerns for endocarditis.  Follow up with ID clinic if improvement in functional status. 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 Patient follows with Dr. Benay Spice and is receiving chemotherapy. Recent imaging significant for progression of disease. Palliative care consulted (1/14) and decision made at that time was for full code/full scop treatment. Currently awaiting SNF - no offers, has been declined - will need to follow up. if performance status and nutritional status don't improve, she won't be candidate for chemo -would need to consider hospice.  Severe sepsis [facility]) Not present on admission. Unsure of source. Port site does not appear infected. Associated leukocytosis, fevers, hypotension. Blood cultures (1/21) positive for GPCs with BCID suggesting enterococcus faecalis. Started on ampicillin IV. -See problem, Bacteremia due to Enterococcus  Acute metabolic encephalopathy Oxycodone reduced. Possibly some effect from underlying bacteremia. Somnolence is improved. Encephalopathy improved MRI 1/23 with motion artifact, no acute intracranial process EEG with moderate diffuse encephalopathy, no seizures or epileptiform discharges  Thrombocytopenia ([place]) Related to chemotherapy. resolved.  Hypokalemia- (present on admission) Potassium supplementation as needed  Essential hypertension- (present on admission) Patient is on hydrochlorothiazide, Coreg and amlodipine as an outpatient. Coreg and amlodipine continued. Blood pressure mostly well controlled but with hypotension in setting of worsened anemia and sepsis. -Continue Coreg - hold HCTZ and amlodipine  Leukocytosis Likely secondary to bacteremia/infection.  WBC still elevated, mild improvement - with fever, follow repeat cultures  Protein-calorie malnutrition, severe Patient continues to have poor oral intake which may affect her candidacy for future treatment of cancer.   Dietitian recommendations: Continue Boost Breeze TID and Prosource Plus BID. Continue to provide encouragement with eating and drinking as able.   Cancer related pain- (present  on admission) Continue Oxycodone prn  Diabetes mellitus (HCC) Hemoglobin A1C of 7.1 Continue SSI  Hypotension-resolved as of 03/03/2021 Likely related to combination of anemia, sepsis and iatrogenic from antihypertensives.  Given [name] 500 mL NS bolus. Resolved.   DVT prophylaxis: SCD Code Status: full Family Communication: none Disposition:   Status is: Inpatient  Remains inpatient appropriate because: continued IV abx, need for intermittent tranfusion       Consultants:  Oncology Palliative care  Procedures:  none  Antimicrobials:  Anti-infectives (From admission, onward)    Start     Dose/Rate Route Frequency Ordered Stop   03/02/21 0800  ampicillin (OMNIPEN) 2 g in sodium chloride 0.9 % 100 mL IVPB  Status:  Discontinued        2 g 300 mL/hr over 20 Minutes Intravenous Every 4 hours 03/02/21 0619 03/02/21 0643   03/02/21 0730  ampicillin (OMNIPEN) 2 g in sodium chloride 0.9 % 100 mL IVPB        2 g 300 mL/hr over 20 Minutes Intravenous Every 4 hours 03/02/21 0643         Subjective: No new complaints  Objective: Vitals:   03/08/21 0500 03/08/21 0540 03/08/21 0920 03/08/21 1232  BP:  137/78 110/75 105/63  Pulse:  (!) 113 (!) 113 99  Resp:  18  18  Temp:  98.8 F (37.1 C)  98.1 F (36.7 C)  TempSrc:  Oral  Oral  SpO2:  100%  99%  Weight: 111.9 kg     Height:        Intake/Output Summary (Last 24 hours) at 03/08/2021 1527 Last data filed at 03/08/2021 0450 Gross per 24 hour  Intake 240 ml  Output 975 ml  Net -735 ml   Filed Weights   03/05/21 0630 03/07/21 0500 03/08/21 0500  Weight: 111.9 kg 111.5 kg 111.9 kg    Examination:  General: No acute distress. Cardiovascular: RRR Lungs: unlabored Abdomen: mild TTP Neurological: Alert - seems less confused. Moves all  extremities 4 . Cranial nerves II through XII grossly intact. Skin: Warm and dry. No rashes or lesions. Extremities: No clubbing or cyanosis. No edema.   Data Reviewed: I have personally reviewed following labs and imaging studies  CBC: Recent Labs  Lab 03/04/21 0348 03/05/21 0500 03/05/21 2030 03/06/21 0239 03/07/21 0507 03/08/21 0425  WBC 23.1* 22.2*  --  20.7* 18.5* 17.4*  NEUTROABS  --   --   --  18.0* 15.9* 14.9*  HGB 7.7* 7.3* 7.4* 7.2* 7.7* 7.9*  HCT 23.8* 23.2* 23.6* 23.2* 25.1* 25.3*  MCV 94.4 95.5  --  96.3 94.7 95.5  PLT 122* 133*  --  140* 149* 242    Basic Metabolic Panel: Recent Labs  Lab 03/03/21 0610 03/04/21 0348 03/06/21 0239 03/07/21 0507 03/08/21 0425  NA 130* 134* 135 134* 134*  K 4.2 4.2 4.0 4.1 4.1  CL 102 105 106 105 106  CO2 20* 21* 22 21* 20*  GLUCOSE 102* 97 90 100* 89  BUN 31* 28* 25* 22* 17  CREATININE 0.95 0.87 0.70 0.77 0.72  CALCIUM 8.4* 8.3* 8.3* 8.4* 8.5*  MG  --   --  2.2 2.0 1.9  PHOS  --   --  3.1 3.2 2.7    GFR: Estimated Creatinine Clearance: 97.3 mL/min (by C-G formula based on SCr of 0.72 mg/dL).  Liver Function Tests: Recent Labs  Lab 03/06/21 0239 03/07/21 0507 03/08/21 0425  AST 19 18 18   ALT 9 10 10  ALKPHOS 168* 158* 169*  BILITOT 1.2 1.6* 1.6*  PROT 5.8* 5.9* 5.9*  ALBUMIN <1.5* <1.5* <1.5*    CBG: Recent Labs  Lab 03/07/21 1702 03/07/21 1721 03/07/21 2008 03/08/21 0747 03/08/21 1115  GLUCAP 80 83 82 77 107*     Recent Results (from the past 240 hour(s))  Culture, blood (routine x 2)     Status: Abnormal   Collection Time: 03/01/21 11:03 AM   Specimen: BLOOD  Result Value Ref Range Status   Specimen Description   Final    BLOOD RIGHT ANTECUBITAL Performed at [facility], [place] [address], [place], [place] [address]    Special Requests   Final    BOTTLES DRAWN AEROBIC AND ANAEROBIC Blood Culture adequate volume Performed at [facility]  [address] [place], [place] [address]    Culture  Setup Time   Final    GRAM POSITIVE COCCI IN BOTH AEROBIC AND ANAEROBIC BOTTLES CRITICAL RESULT CALLED TO, READ BACK BY AND VERIFIED WITH: M LILLISTON,[email or web address]  03/02/21 [place] Performed at [facility], [place] [address] [place], [place] [address]    Culture ENTEROCOCCUS FAECALIS (Shavon Zenz)  Final   Report Status 03/04/2021 FINAL  Final   Organism ID, Bacteria ENTEROCOCCUS FAECALIS  Final      Susceptibility   Enterococcus faecalis - MIC*    AMPICILLIN <=2 SENSITIVE Sensitive     VANCOMYCIN 1 SENSITIVE Sensitive     GENTAMICIN SYNERGY SENSITIVE Sensitive     * ENTEROCOCCUS FAECALIS  Culture, blood (routine x 2)     Status: Abnormal   Collection Time: 03/01/21 11:03 AM   Specimen: BLOOD  Result Value Ref Range Status   Specimen Description   Final    BLOOD BLOOD LEFT HAND Performed at [facility], [place] [address], [place], [place] [address]    Special Requests   Final    BOTTLES DRAWN AEROBIC AND ANAEROBIC Blood Culture adequate volume Performed at [facility] [address] [place], [place] [address]    Culture  Setup Time   Final    GRAM POSITIVE COCCI IN BOTH AEROBIC AND ANAEROBIC BOTTLES CRITICAL VALUE NOTED.  VALUE IS CONSISTENT WITH PREVIOUSLY REPORTED AND CALLED VALUE.    Culture (Anesia Blackwell)  Final    ENTEROCOCCUS FAECALIS SUSCEPTIBILITIES PERFORMED ON PREVIOUS CULTURE WITHIN THE LAST 5 DAYS. Performed at [facility], [place] [address], [place], [place] [address]    Report Status 03/04/2021 FINAL  Final  Blood Culture ID Panel (Reflexed)     Status: Abnormal   Collection Time: 03/01/21 11:03 AM  Result Value Ref Range Status   Enterococcus faecalis DETECTED (Caliann Leckrone) NOT DETECTED Final    Comment: CRITICAL RESULT CALLED TO, READ BACK BY AND VERIFIED WITH: M LILLISTON,[email or web address]  03/02/21 [place]    Enterococcus Faecium NOT DETECTED NOT DETECTED Final   Listeria monocytogenes NOT DETECTED NOT  DETECTED Final   Staphylococcus species NOT DETECTED NOT DETECTED Final   Staphylococcus aureus (BCID) NOT DETECTED NOT DETECTED Final   Staphylococcus epidermidis NOT DETECTED NOT DETECTED Final   Staphylococcus lugdunensis NOT DETECTED NOT DETECTED Final   Streptococcus species NOT DETECTED NOT DETECTED Final   Streptococcus agalactiae NOT DETECTED NOT DETECTED Final   Streptococcus pneumoniae NOT DETECTED NOT DETECTED Final   Streptococcus pyogenes NOT DETECTED NOT DETECTED Final   Braxley Balandran.calcoaceticus-baumannii NOT DETECTED NOT DETECTED Final   Bacteroides fragilis NOT DETECTED NOT DETECTED Final   Enterobacterales NOT DETECTED NOT DETECTED Final  Enterobacter cloacae complex NOT DETECTED NOT DETECTED Final   Escherichia coli NOT DETECTED NOT DETECTED Final   Klebsiella aerogenes NOT DETECTED NOT DETECTED Final   Klebsiella oxytoca NOT DETECTED NOT DETECTED Final   Klebsiella pneumoniae NOT DETECTED NOT DETECTED Final   Proteus species NOT DETECTED NOT DETECTED Final   Salmonella species NOT DETECTED NOT DETECTED Final   Serratia marcescens NOT DETECTED NOT DETECTED Final   Haemophilus influenzae NOT DETECTED NOT DETECTED Final   Neisseria meningitidis NOT DETECTED NOT DETECTED Final   Pseudomonas aeruginosa NOT DETECTED NOT DETECTED Final   Stenotrophomonas maltophilia NOT DETECTED NOT DETECTED Final   Candida albicans NOT DETECTED NOT DETECTED Final   Candida auris NOT DETECTED NOT DETECTED Final   Candida glabrata NOT DETECTED NOT DETECTED Final   Candida krusei NOT DETECTED NOT DETECTED Final   Candida parapsilosis NOT DETECTED NOT DETECTED Final   Candida tropicalis NOT DETECTED NOT DETECTED Final   Cryptococcus neoformans/gattii NOT DETECTED NOT DETECTED Final   Vancomycin resistance NOT DETECTED NOT DETECTED Final    Comment: Performed at [facility], [place] [address], [place], [place] [address]  Culture, blood (routine x 2)     Status: None   Collection Time: 03/02/21   4:18 PM   Specimen: BLOOD  Result Value Ref Range Status   Specimen Description   Final    BLOOD LEFT ANTECUBITAL Performed at [facility] [address], [place], [place] [address]    Special Requests   Final    BOTTLES DRAWN AEROBIC ONLY Blood Culture adequate volume Performed at [facility] [address], [place], [place] [address]    Culture   Final    NO GROWTH 5 DAYS Performed at [facility], [place] [address] [place], [place] [address]    Report Status 03/07/2021 FINAL  Final  Culture, blood (routine x 2)     Status: None   Collection Time: 03/02/21  4:18 PM   Specimen: BLOOD  Result Value Ref Range Status   Specimen Description   Final    BLOOD LEFT ANTECUBITAL Performed at [facility] [address] [place], [place] [address]    Special Requests   Final    BOTTLES DRAWN AEROBIC AND ANAEROBIC Blood Culture adequate volume Performed at [facility] [address] [place], [place] [address]    Culture   Final    NO GROWTH 5 DAYS Performed at [facility], [place] [address], [place], [place] [address]    Report Status 03/07/2021 FINAL  Final  Culture, blood (routine x 2)     Status: None (Preliminary result)   Collection Time: 03/07/21 10:52 AM   Specimen: Left Antecubital; Blood  Result Value Ref Range Status   Specimen Description   Final    LEFT ANTECUBITAL Performed at [facility] [address] [place], [place] [address]    Special Requests   Final    BOTTLES DRAWN AEROBIC AND ANAEROBIC Blood Culture adequate volume Performed at [facility] [address] [place], [place] [address]    Culture   Final    NO GROWTH < 24 HOURS Performed at [facility] [address], [place], [place] [address]    Report Status PENDING  Incomplete  Culture, blood (routine x 2)     Status: None (Preliminary result)   Collection Time:  03/07/21 10:52 AM   Specimen: Left Antecubital;  Blood  Result Value Ref Range Status   Specimen Description   Final    LEFT ANTECUBITAL Performed at [facility] [address], [place], [place] [address]    Special Requests   Final    BOTTLES DRAWN AEROBIC AND ANAEROBIC Blood Culture adequate volume Performed at [facility] [address] [place], [place] [address]    Culture   Final    NO GROWTH < 24 HOURS Performed at [facility] [address], [place], [place] [address]    Report Status PENDING  Incomplete         Radiology Studies: EEG adult  Result Date: 2021/03/31 Lora Havens, MD     Mar 31, 2021  8:48 AM Patient Name: NASREEN GOEDECKE MRN: [id number] Epilepsy Attending: Lora Havens Referring Physician/Provider: Elodia Florence., MD Date: 03/06/2021 Duration: 21.53 mins Patient history: 56 year old female with altered mental status.  EEG to evaluate for seizure. Level of alertness: Awake, asleep AEDs during EEG study: None Technical aspects: This EEG study was done with scalp electrodes positioned according to the 10-20 International system of electrode placement. Electrical activity was acquired at Westlyn Glaza sampling rate of 500Hz  and reviewed with Andilynn Delavega high frequency filter of 70Hz  and Odetta Forness low frequency filter of 1Hz . EEG data were recorded continuously and digitally stored. Description: The posterior dominant rhythm consists of 7 Hz activity of moderate voltage (25-35 uV) seen predominantly in posterior head regions, symmetric and reactive to eye opening and eye closing. Sleep was characterized by sleep spindles (12 to 14 Hz), maximal frontocentral region.  EEG showed continuous generalized 3 to 6 Hz theta-delta slowing. Hyperventilation and photic stimulation were not performed.   ABNORMALITY - Continuous slow, generalized - Background slow IMPRESSION: This study is suggestive of moderate diffuse encephalopathy, nonspecific  etiology. No seizures or epileptiform discharges were seen throughout the recording. Priyanka [name]        Scheduled Meds:  (feeding supplement) PROSource Plus  30 mL Oral BID BM   sodium chloride   Intravenous Once   carvedilol  6.25 mg Oral BID WC   Chlorhexidine Gluconate Cloth  6 each Topical Daily   feeding supplement  1 Container Oral BID BM   feeding supplement  237 mL Oral Q24H   insulin aspart  0-15 Units Subcutaneous TID WC   insulin aspart  0-5 Units Subcutaneous QHS   mouth rinse  15 mL Mouth Rinse BID   multivitamin with minerals  1 tablet Oral Daily   pantoprazole  40 mg Oral Daily   Continuous Infusions:  ampicillin (OMNIPEN) IV 2 g (03/08/21 1218)     LOS: 16 days    Time spent: over 30 min    Fayrene Helper, MD [facility]   To contact the attending provider between 7A-7P or the covering provider during after hours 7P-7A, please log into the web site [email or web address] and access using universal [place] password for that web site. If you do not have the password, please call the hospital operator.  03/08/2021, 3:27 PM

## 2021-03-08 NOTE — Plan of Care (Signed)
°  Problem: Clinical Measurements: Goal: Cardiovascular complication will be avoided Outcome: Not Progressing   Problem: Activity: Goal: Risk for activity intolerance will decrease Outcome: Not Progressing   Problem: Nutrition: Goal: Adequate nutrition will be maintained Outcome: Not Progressing

## 2021-03-08 NOTE — Progress Notes (Signed)
°   03/08/21 0540  Assess: MEWS Score  Temp 98.8 F (37.1 C)  BP 137/78  Pulse Rate (!) 113  Resp 18  Level of Consciousness Alert  SpO2 100 %  O2 Device Nasal Cannula  O2 Flow Rate (L/min) 2 L/min  Assess: MEWS Score  MEWS Temp 0  MEWS Systolic 0  MEWS Pulse 2  MEWS RR 0  MEWS LOC 0  MEWS Score 2  MEWS Score Color Yellow  Assess: if the MEWS score is Yellow or Red  Were vital signs taken at a resting state? Yes  Focused Assessment No change from prior assessment  Does the patient meet 2 or more of the SIRS criteria? No  Does the patient have a confirmed or suspected source of infection? Yes  Provider and Rapid Response Notified? No (on call provider notified)  MEWS guidelines implemented *See Row Information* Yes  Treat  MEWS Interventions Escalated (See documentation below)  Take Vital Signs  Increase Vital Sign Frequency  Yellow: Q 2hr X 2 then Q 4hr X 2, if remains yellow, continue Q 4hrs  Escalate  MEWS: Escalate Yellow: discuss with charge nurse/RN and consider discussing with provider and RRT  Notify: Charge Nurse/RN  Name of Charge Nurse/RN Notified Koren Bound  Date Charge Nurse/RN Notified 03/08/21  Time Charge Nurse/RN Notified [phone]  Notify: Provider  Provider Name/Title Myrna Blazer  Date Provider Notified 03/08/21  Time Provider Notified [phone]  Notification Type Page  Notification Reason Change in status  Provider response Other (Comment) (EKG)  Date of Provider Response 03/08/21  Time of Provider Response [phone]  Document  Patient Outcome Other (Comment) (orders placed by oncall provider)  Progress note created (see row info) Yes  Assess: SIRS CRITERIA  SIRS Temperature  0  SIRS Pulse 1  SIRS Respirations  0  SIRS WBC 1  SIRS Score Sum  2

## 2021-03-09 LAB — COMPREHENSIVE METABOLIC PANEL
ALT: 8 U/L (ref 0–44)
AST: 17 U/L (ref 15–41)
Albumin: <1.5 g/dL — ABNORMAL LOW (ref 3.5–5.0)
Alkaline Phosphatase: 167 U/L — ABNORMAL HIGH (ref 38–126)
Anion gap: 5 (ref 5–15)
BUN: 15 mg/dL (ref 6–20)
CO2: 23 mmol/L (ref 22–32)
Calcium: 8.5 mg/dL — ABNORMAL LOW (ref 8.9–10.3)
Chloride: 105 mmol/L (ref 98–111)
Creatinine, Ser: 0.69 mg/dL (ref 0.44–1.00)
GFR, Estimated: >60 mL/min (ref >60)
Glucose, Bld: 97 mg/dL (ref 70–99)
Potassium: 4 mmol/L (ref 3.5–5.1)
Sodium: 133 mmol/L — ABNORMAL LOW (ref 135–145)
Total Bilirubin: 1.3 mg/dL — ABNORMAL HIGH (ref 0.3–1.2)
Total Protein: 5.7 g/dL — ABNORMAL LOW (ref 6.5–8.1)

## 2021-03-09 LAB — CBC WITH DIFFERENTIAL/PLATELET
Abs Immature Granulocytes: 0.31 10*3/uL — ABNORMAL HIGH (ref 0.00–0.07)
Basophils Absolute: 0 10*3/uL (ref 0.0–0.1)
Basophils Relative: 0 %
Eosinophils Absolute: 0 10*3/uL (ref 0.0–0.5)
Eosinophils Relative: 0 %
HCT: 22.8 % — ABNORMAL LOW (ref 36.0–46.0)
Hemoglobin: 7.2 g/dL — ABNORMAL LOW (ref 12.0–15.0)
Immature Granulocytes: 2 %
Lymphocytes Relative: 6 %
Lymphs Abs: 0.8 10*3/uL (ref 0.7–4.0)
MCH: 30.5 pg (ref 26.0–34.0)
MCHC: 31.6 g/dL (ref 30.0–36.0)
MCV: 96.6 fL (ref 80.0–100.0)
Monocytes Absolute: 1 10*3/uL (ref 0.1–1.0)
Monocytes Relative: 7 %
Neutro Abs: 12 10*3/uL — ABNORMAL HIGH (ref 1.7–7.7)
Neutrophils Relative %: 85 %
Platelets: 176 10*3/uL (ref 150–400)
RBC: 2.36 MIL/uL — ABNORMAL LOW (ref 3.87–5.11)
RDW: 17.3 % — ABNORMAL HIGH (ref 11.5–15.5)
WBC: 14.1 10*3/uL — ABNORMAL HIGH (ref 4.0–10.5)
nRBC: 0.1 % (ref 0.0–0.2)

## 2021-03-09 LAB — HEMOGLOBIN AND HEMATOCRIT, BLOOD
HCT: 23.6 % — ABNORMAL LOW (ref 36.0–46.0)
Hemoglobin: 7.3 g/dL — ABNORMAL LOW (ref 12.0–15.0)

## 2021-03-09 LAB — PHOSPHORUS: Phosphorus: 2.7 mg/dL (ref 2.5–4.6)

## 2021-03-09 LAB — GLUCOSE, CAPILLARY
Glucose-Capillary: 90 mg/dL (ref 70–99)
Glucose-Capillary: 113 mg/dL — ABNORMAL HIGH (ref 70–99)
Glucose-Capillary: 115 mg/dL — ABNORMAL HIGH (ref 70–99)
Glucose-Capillary: 94 mg/dL (ref 70–99)

## 2021-03-09 LAB — MAGNESIUM: Magnesium: 1.9 mg/dL (ref 1.7–2.4)

## 2021-03-09 MED ORDER — HYDROCOD POLI-CHLORPHE POLI ER 10-8 MG/5ML PO SUER
5.0000 mL | Freq: Two times a day (BID) | ORAL | Status: DC | PRN
  Administered 2021-03-09 – 2021-03-10 (×2): 5 mL via ORAL
  Filled 2021-03-09 (×2): qty 5

## 2021-03-09 MED ORDER — OXYCODONE HCL 5 MG PO TABS
5.0000 mg | ORAL_TABLET | ORAL | Status: DC | PRN
Start: 2021-03-09 — End: 2021-03-13
  Administered 2021-03-09 – 2021-03-13 (×18): 5 mg via ORAL
  Filled 2021-03-09 (×18): qty 1

## 2021-03-09 MED ORDER — OXYCODONE HCL 5 MG PO TABS
7.5000 mg | ORAL_TABLET | ORAL | Status: DC | PRN
  Administered 2021-03-09: 7.5 mg via ORAL
  Filled 2021-03-09: qty 2

## 2021-03-09 NOTE — NC FL2 (Signed)
[place] LEVEL OF CARE SCREENING TOOL     IDENTIFICATION  Patient Name: Erin [name] Birthdate: July 31, 1965 Sex: female Admission Date (Current Location): 02/20/2021  [facility] and [place] Number:  Herbalist and Address:  [facility],  [address], [place]      Provider Number: [id number]  Attending Physician Name and Address:  Elodia Florence., *  Relative Name and Phone Number:  [facility] Niece [phone]  [phone]  [facility] Other   [phone]  Rozelle Logan Sister   [phone]  Derl Barrow   [phone]    Current Level of Care: Hospital Recommended Level of Care: [place] Prior Approval Number:    Date Approved/Denied:   PASRR Number: [id number]  Discharge Plan: SNF    Current Diagnoses: Patient Active Problem List   Diagnosis Date Noted   Bacteremia due to Enterococcus    Leukocytosis 18/29/9371   Acute metabolic encephalopathy 69/67/8938   Thrombocytopenia ([place]) 02/26/2021   Protein-calorie malnutrition, severe 02/22/2021   Severe anemia 02/20/2021   Anemia 12/31/2020   Severe sepsis ([place]) 12/30/2020   Panic attack 12/29/2020   Dysuria 12/28/2020   Gastric cancer ([place]) 12/27/2020   Cancer related pain 12/27/2020   Gastritis and gastroduodenitis    Adenocarcinoma ([place]) 12/20/2020   Metastatic adenocarcinoma of unknown origin ([place])    Malnutrition of moderate degree 12/18/2020   Elevated CEA 12/18/2020   Mass of urethra    Abdominal pain 12/16/2020   Ovarian mass 12/16/2020   Hypokalemia 12/16/2020   Tear of right rotator cuff 04/04/2020   Vitamin D deficiency 04/02/2020   Other fatigue 03/13/2020   Shortness of breath on exertion 03/13/2020   OSA (obstructive sleep apnea) 11/25/2017   Obesity hypoventilation syndrome ([place]) 11/25/2017   Mallet finger of right hand 09/03/2017   Anterolisthesis 04/17/2017   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[place]) 04/17/2017    Degenerative disc disease, lumbar 04/17/2017   Class 3 severe obesity with serious comorbidity and body mass index (BMI) of 50.0 to 59.9 in adult [facility]) 06/01/2016   Diabetes mellitus ([place]) 05/27/2016   Essential hypertension 04/13/2016   Menorrhagia 08/26/2015    Orientation RESPIRATION BLADDER Height & Weight     Self, Time, Situation, Place  Normal Incontinent Weight: 246 lb 0.5 oz (111.6 kg) Height:  5\' 4"  (162.6 cm)  BEHAVIORAL SYMPTOMS/MOOD NEUROLOGICAL BOWEL NUTRITION STATUS      Continent Diet (regular)  AMBULATORY STATUS COMMUNICATION OF NEEDS Skin   Limited Assist Verbally Normal                       Personal Care Assistance Level of Assistance  Bathing, Feeding, Dressing Bathing Assistance: Limited assistance Feeding assistance: Independent Dressing Assistance: Limited assistance     Functional Limitations Info  Sight, Hearing, Speech Sight Info: Adequate Hearing Info: Adequate Speech Info: Adequate    SPECIAL CARE FACTORS FREQUENCY  PT (By licensed PT), OT (By licensed OT)     PT Frequency: Minimum 5x a week OT Frequency: Minimum 5x a week            Contractures Contractures Info: Not present    Additional Factors Info  Code Status, Allergies, Insulin Sliding Scale Code Status Info: Full Code Allergies Info: Tramadol   Insulin Sliding Scale Info: insulin aspart (novoLOG) injection 0-15 Units 3x a day with meals       Current Medications (03/09/2021):  This is the current hospital active medication list Current Facility-Administered Medications  Medication Dose Route Frequency Provider Last Rate Last Admin   (feeding supplement) PROSource Plus liquid 30 mL  30 mL Oral BID BM Gonfa, Taye T, MD   30 mL at 03/03/21 1226   0.9 %  sodium chloride infusion (Manually program via Guardrails IV Fluids)   Intravenous Once Elodia Florence., MD       acetaminophen (TYLENOL) tablet 650 mg  650 mg Oral Q6H PRN Marcelyn Bruins, MD   650 mg at  03/06/21 0546   Or   acetaminophen (TYLENOL) suppository 650 mg  650 mg Rectal Q6H PRN Marcelyn Bruins, MD   650 mg at 02/21/21 0030   ampicillin (OMNIPEN) 2 g in sodium chloride 0.9 % 100 mL IVPB  2 g Intravenous Q4H Mariel Aloe, MD 300 mL/hr at 03/09/21 1429 2 g at 03/09/21 1429   benzonatate (TESSALON) capsule 100 mg  100 mg Oral TID PRN Elodia Florence., MD   100 mg at 03/09/21 1049   carvedilol (COREG) tablet 6.25 mg  6.25 mg Oral BID WC Mariel Aloe, MD   6.25 mg at 03/09/21 0813   Chlorhexidine Gluconate Cloth 2 % PADS 6 each  6 each Topical Daily Mercy Riding, MD   6 each at 03/09/21 1429   feeding supplement (BOOST / RESOURCE BREEZE) liquid 1 Container  1 Container Oral BID BM Mariel Aloe, MD       feeding supplement (ENSURE ENLIVE / ENSURE PLUS) liquid 237 mL  237 mL Oral Q24H Mariel Aloe, MD   237 mL at 03/08/21 1357   insulin aspart (novoLOG) injection 0-15 Units  0-15 Units Subcutaneous TID WC Marcelyn Bruins, MD   3 Units at 03/08/21 1658   insulin aspart (novoLOG) injection 0-5 Units  0-5 Units Subcutaneous QHS Marcelyn Bruins, MD       liver oil-zinc oxide (DESITIN) 40 % ointment   Topical PRN Mariel Aloe, MD   Given at 03/02/21 1034   loperamide (IMODIUM) capsule 2 mg  2 mg Oral QID PRN Marcelyn Bruins, MD       MEDLINE mouth rinse  15 mL Mouth Rinse BID Mariel Aloe, MD   15 mL at 03/09/21 0814   metoCLOPramide (REGLAN) injection 5 mg  5 mg Intravenous TID WC PRN Wendee Beavers T, MD   5 mg at 02/21/21 1626   multivitamin with minerals tablet 1 tablet  1 tablet Oral Daily Wendee Beavers T, MD   1 tablet at 03/09/21 0813   ondansetron (ZOFRAN) injection 4 mg  4 mg Intravenous Q6H PRN Marcelyn Bruins, MD       oxyCODONE (Oxy IR/ROXICODONE) immediate release tablet 5 mg  5 mg Oral Q4H PRN Elodia Florence., MD       Or   oxyCODONE (Oxy IR/ROXICODONE) immediate release tablet 7.5 mg  7.5 mg Oral Q4H PRN Elodia Florence., MD   7.5  mg at 03/09/21 1051   pantoprazole (PROTONIX) EC tablet 40 mg  40 mg Oral Daily Marcelyn Bruins, MD   40 mg at 03/09/21 0813   senna-docusate (Senokot-S) tablet 1 tablet  1 tablet Oral QHS PRN Elodia Florence., MD   1 tablet at 03/07/21 7741     Discharge Medications: Please see discharge summary for a list of discharge medications.  Relevant Imaging Results:  Relevant Lab Results:   Additional Information SSN [id number]  Ross Ludwig, LCSW

## 2021-03-09 NOTE — Progress Notes (Signed)
Patient is not wanting to wear CPAP tonight. Patient states that the mask is too tight and I explained to her that I could adjust the mask and she still said no. Encouraged patient to call if she changes her mind.Patient has on 2L nasal cannula.

## 2021-03-09 NOTE — Progress Notes (Deleted)
PROGRESS NOTE    KIMIYA [name]  JJH:[id number] DOB: [birth year] DOA: 02/20/2021 PCP: Pcp, No  Chief Complaint  Patient presents with   Nausea    Brief Narrative:  Erin [name] is Erin [name] 56 y.o. 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 Patient presented secondary to nausea/vomiting/diarrhea and abdominal pain. Hospitalization complicated by recurrent anemia. Fecal occult blood test negative. Patient has received 5 units of PRBC to date. Platelets improving. Now found to have E. Faecalis bacteremia.    Assessment & Plan:   Principal Problem:   Severe anemia Active Problems:   Bacteremia due to Enterococcus   Adenocarcinoma (HCC)   Metastatic adenocarcinoma of unknown origin (HCC)   Severe sepsis (HCC)   Thrombocytopenia (HCC)   Acute metabolic encephalopathy   Hypokalemia   Essential hypertension   Leukocytosis   Protein-calorie malnutrition, severe   Cancer related pain   Diabetes mellitus (HCC)   OSA (obstructive sleep apnea)   Obesity hypoventilation syndrome (HCC)  Assessment and Plan: * Severe anemia- (present on admission) Secondary to chemotherapy.  Continues to fluctuate, trend Patient has received 6 units of PRBC to date.  Bacteremia due to Enterococcus Infectious disease consulted. Likely GI source. Concern for possible endocarditis. Transthoracic Echocardiogram without evidence of vegetation. Repeat blood cultures (1/22) are no growth to date -ID recommendations: Ampicillin IV x 4 weeks.  Consider rechecking TTE and repeat blood cultures based on performance status and her candidacy for chemotherapy in the future.  Needs TEE and repeat cultures to assure clearance prior to restarting chemo to ensure port is not infected and no concerns for endocarditis.  Follow up with ID clinic if improvement in functional status. 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 Patient follows with Dr. Benay Spice and is receiving chemotherapy. Recent imaging significant for progression of disease. Palliative care consulted (1/14) and decision made at that time was for full code/full scop treatment. Currently awaiting SNF - no offers, has been declined - SW/CM continuing to work on this Prior to this admission had been living with sister if performance status and nutritional status don't improve, she won't be candidate for chemo -would need to consider hospice.  Severe sepsis [facility]) Not present on admission. Unsure of source. Port site does not appear infected. Associated leukocytosis, fevers, hypotension. Blood cultures (1/21) positive for GPCs with BCID suggesting enterococcus faecalis. Started on ampicillin IV. -See problem, Bacteremia due to Enterococcus  Acute metabolic encephalopathy Oxycodone reduced. Possibly some effect from underlying bacteremia. Somnolence is improved. Encephalopathy improved - follow encephalopathy with pain meds - was ok today, seemed improved MRI 1/23 with motion artifact, no acute intracranial process EEG with moderate diffuse encephalopathy, no seizures or epileptiform discharges  Thrombocytopenia ([place]) Related to chemotherapy. resolved.  Hypokalemia- (present on admission) Potassium supplementation as needed  Essential hypertension- (present on admission) Patient is on hydrochlorothiazide, Coreg and amlodipine as an outpatient. Coreg and amlodipine continued. Blood pressure mostly well controlled but with hypotension in setting of worsened anemia and sepsis. -Continue Coreg - hold HCTZ and amlodipine  Leukocytosis Likely secondary to bacteremia/infection.  WBC still elevated, seems to be gradually  improving, relatively stable today  Protein-calorie malnutrition, severe Patient continues to have poor oral intake which may affect her candidacy for future treatment of cancer.  Dietitian recommendations: Continue Boost Breeze TID and Prosource Plus BID. Continue to provide encouragement with eating and drinking as able.   Cancer related pain- (present on admission) Continue Oxycodone prn  Diabetes mellitus ([place]) Hemoglobin A1C of 7.1 Continue SSI  Hypotension-resolved as of 03/03/2021 Likely related to combination of anemia, sepsis and iatrogenic from antihypertensives.  Given Leva Baine 500 mL NS bolus. Resolved.   DVT prophylaxis: SCD Code Status: full Family Communication: none Disposition:   Status is: Inpatient  Remains inpatient appropriate because: continued IV abx, need for intermittent tranfusion       Consultants:  Oncology Palliative care  Procedures:  none  Antimicrobials:  Anti-infectives (From admission, onward)    Start     Dose/Rate Route Frequency Ordered Stop   03/02/21 0800  ampicillin (OMNIPEN) 2 g in sodium chloride 0.9 % 100 mL IVPB  Status:  Discontinued        2 g 300 mL/hr over 20 Minutes Intravenous Every 4 hours 03/02/21 0619 03/02/21 0643   03/02/21 0730  ampicillin (OMNIPEN) 2 g in sodium chloride 0.9 % 100 mL IVPB        2 g 300 mL/hr over 20 Minutes Intravenous Every 4 hours 03/02/21 0643         Subjective: No new complaints Was living with sister before this hospital stay, not sure if she'll be able to manage her alone   Objective: Vitals:   03/09/21 2012 03/10/21 0532 03/10/21 1340 03/10/21 1351  BP: 102/67 108/67  107/75  Pulse: 92 95 95 97  Resp: 20 20  16   Temp: 99.2 F (37.3 C) 98.2 F (36.8 C)  (!) 97.5 F (36.4 C)  TempSrc:  Oral  Oral  SpO2: 100% 97%  95%  Weight:      Height:        Intake/Output Summary (Last 24 hours) at 03/10/2021 1921 Last data filed at 03/10/2021 0532 Gross per 24 hour  Intake --   Output 600 ml  Net -600 ml   Filed Weights   03/07/21 0500 03/08/21 0500 03/09/21 0500  Weight: 111.5 kg 111.9 kg 111.6 kg    Examination:  General: No acute distress. Cardiovascular: RRR Lungs: unlabored Abdomen: Soft, nontender, nondistended  Neurological: Alert and oriented 3. Moves all extremities 4. Cranial nerves II through XII grossly intact. Skin: Warm and dry. No rashes or lesions. Extremities: No clubbing or cyanosis. No edema.   Data Reviewed: I have personally reviewed following labs and imaging studies  CBC: Recent Labs  Lab 03/06/21 0239 03/07/21 0507 03/08/21 0425 03/09/21 0145 03/09/21 1707 03/10/21 0321 03/10/21 1617  WBC 20.7* 18.5* 17.4* 14.1*  --  14.2*  --   NEUTROABS 18.0* 15.9* 14.9* 12.0*  --  11.6*  --   HGB 7.2* 7.7* 7.9* 7.2* 7.3* 7.1* 7.8*  HCT 23.2* 25.1* 25.3* 22.8* 23.6* 23.7* 24.9*  MCV 96.3 94.7 95.5 96.6  --  97.5  --   PLT 140* 149* 159 176  --  149*  --     Basic Metabolic Panel: Recent Labs  Lab 03/06/21 0239 03/07/21 0507 03/08/21 0425 03/09/21 0145 03/10/21 0321  NA 135 134* 134* 133* 133*  K 4.0 4.1 4.1 4.0 4.1  CL 106 105 106 105 105  CO2 22 21* 20* 23 23  GLUCOSE 90 100* 89 97 103*  BUN 25* 22* 17 15 15   CREATININE 0.70 0.77 0.72 0.69 0.69  CALCIUM 8.3* 8.4* 8.5* 8.5* 8.5*  MG 2.2 2.0 1.9 1.9 2.0  PHOS 3.1 3.2 2.7 2.7 2.9    GFR: Estimated Creatinine Clearance: 97.2 mL/min (by C-G formula based on SCr of 0.69 mg/dL).  Liver Function Tests: Recent Labs  Lab 03/06/21 0239 03/07/21 0507 03/08/21 0425 03/09/21 0145 03/10/21 0321  AST 19 18 18 17 18   ALT 9 10 10 8 9   ALKPHOS 168* 158* 169* 167* 159*  BILITOT 1.2 1.6* 1.6* 1.3* 1.0  PROT 5.8* 5.9* 5.9* 5.7* 5.6*  ALBUMIN <1.5* <1.5* <1.5* <1.5* <1.5*    CBG: Recent Labs  Lab 03/09/21 1655 03/09/21 2014 03/10/21 0753 03/10/21 1202 03/10/21 1654  GLUCAP 115* 94 90 89 101*     Recent Results (from the past 240 hour(s))  Culture, blood  (routine x 2)     Status: Abnormal   Collection Time: 03/01/21 11:03 AM   Specimen: BLOOD  Result Value Ref Range Status   Specimen Description   Final    BLOOD RIGHT ANTECUBITAL Performed at [facility], [place] [address] [place], [place] [address]    Special Requests   Final    BOTTLES DRAWN AEROBIC AND ANAEROBIC Blood Culture adequate volume Performed at [place] [address] [place], [place] [address]    Culture  Setup Time   Final    GRAM POSITIVE COCCI IN BOTH AEROBIC AND ANAEROBIC BOTTLES CRITICAL RESULT CALLED TO, READ BACK BY AND VERIFIED WITH: M LILLISTON,[email or web address]  03/02/21 [place] Performed at [facility], [address], [place], [place] [address]    Culture ENTEROCOCCUS FAECALIS ([place])  Final   Report Status 03/04/2021 FINAL  Final   Organism ID, Bacteria ENTEROCOCCUS FAECALIS  Final      Susceptibility   Enterococcus faecalis - MIC*    AMPICILLIN <=2 SENSITIVE Sensitive     VANCOMYCIN 1 SENSITIVE Sensitive     GENTAMICIN SYNERGY SENSITIVE Sensitive     * ENTEROCOCCUS FAECALIS  Culture, blood (routine x 2)     Status: Abnormal   Collection Time: 03/01/21 11:03 AM   Specimen: BLOOD  Result Value Ref Range Status   Specimen Description   Final    BLOOD BLOOD LEFT HAND Performed at [facility], [place] [address], [place], [place] [address]    Special Requests   Final    BOTTLES DRAWN AEROBIC AND ANAEROBIC Blood Culture adequate volume Performed at [place] [address] [place], [place] [address]    Culture  Setup Time   Final    GRAM POSITIVE COCCI IN BOTH AEROBIC AND ANAEROBIC BOTTLES CRITICAL VALUE NOTED.  VALUE IS CONSISTENT WITH PREVIOUSLY REPORTED AND CALLED VALUE.    Culture (Orella [place])  Final    ENTEROCOCCUS FAECALIS SUSCEPTIBILITIES PERFORMED ON PREVIOUS CULTURE WITHIN THE LAST 5 DAYS. Performed at [facility], [place] [address], [place], [place] [address]    Report  Status 03/04/2021 FINAL  Final  Blood Culture ID Panel (Reflexed)     Status: Abnormal   Collection Time: 03/01/21 11:03 AM  Result Value Ref Range Status   Enterococcus faecalis DETECTED (Zeynep [place]) NOT DETECTED Final    Comment: CRITICAL RESULT CALLED TO, READ BACK BY AND VERIFIED WITH: M LILLISTON,[email or web address]  03/02/21 [place]    Enterococcus Faecium NOT DETECTED NOT DETECTED Final   Listeria monocytogenes NOT DETECTED NOT DETECTED Final   Staphylococcus species NOT DETECTED NOT DETECTED Final   Staphylococcus aureus (BCID) NOT DETECTED NOT DETECTED Final   Staphylococcus epidermidis NOT DETECTED NOT DETECTED Final   Staphylococcus lugdunensis NOT DETECTED  NOT DETECTED Final   Streptococcus species NOT DETECTED NOT DETECTED Final   Streptococcus agalactiae NOT DETECTED NOT DETECTED Final   Streptococcus pneumoniae NOT DETECTED NOT DETECTED Final   Streptococcus pyogenes NOT DETECTED NOT DETECTED Final   Deivi Huckins.calcoaceticus-baumannii NOT DETECTED NOT DETECTED Final   Bacteroides fragilis NOT DETECTED NOT DETECTED Final   Enterobacterales NOT DETECTED NOT DETECTED Final   Enterobacter cloacae complex NOT DETECTED NOT DETECTED Final   Escherichia coli NOT DETECTED NOT DETECTED Final   Klebsiella aerogenes NOT DETECTED NOT DETECTED Final   Klebsiella oxytoca NOT DETECTED NOT DETECTED Final   Klebsiella pneumoniae NOT DETECTED NOT DETECTED Final   Proteus species NOT DETECTED NOT DETECTED Final   Salmonella species NOT DETECTED NOT DETECTED Final   Serratia marcescens NOT DETECTED NOT DETECTED Final   Haemophilus influenzae NOT DETECTED NOT DETECTED Final   Neisseria meningitidis NOT DETECTED NOT DETECTED Final   Pseudomonas aeruginosa NOT DETECTED NOT DETECTED Final   Stenotrophomonas maltophilia NOT DETECTED NOT DETECTED Final   Candida albicans NOT DETECTED NOT DETECTED Final   Candida auris NOT DETECTED NOT DETECTED Final   Candida glabrata NOT DETECTED NOT DETECTED Final   Candida krusei NOT  DETECTED NOT DETECTED Final   Candida parapsilosis NOT DETECTED NOT DETECTED Final   Candida tropicalis NOT DETECTED NOT DETECTED Final   Cryptococcus neoformans/gattii NOT DETECTED NOT DETECTED Final   Vancomycin resistance NOT DETECTED NOT DETECTED Final    Comment: Performed at [facility], [address], [place], [place] [address]  Culture, blood (routine x 2)     Status: None   Collection Time: 03/02/21  4:18 PM   Specimen: BLOOD  Result Value Ref Range Status   Specimen Description   Final    BLOOD LEFT ANTECUBITAL Performed at [place] [address] [place], [place] [address]    Special Requests   Final    BOTTLES DRAWN AEROBIC ONLY Blood Culture adequate volume Performed at [place] [address], [place], [place] [address]    Culture   Final    NO GROWTH 5 DAYS Performed at [facility], [place] [address] [place], [place] [address]    Report Status 03/07/2021 FINAL  Final  Culture, blood (routine x 2)     Status: None   Collection Time: 03/02/21  4:18 PM   Specimen: BLOOD  Result Value Ref Range Status   Specimen Description   Final    BLOOD LEFT ANTECUBITAL Performed at [place] [address], [place], [place] [address]    Special Requests   Final    BOTTLES DRAWN AEROBIC AND ANAEROBIC Blood Culture adequate volume Performed at [place] [address] [place], [place] [address]    Culture   Final    NO GROWTH 5 DAYS Performed at [facility], [place] [address], [place], [place] [address]    Report Status 03/07/2021 FINAL  Final  Culture, blood (routine x 2)     Status: None (Preliminary result)   Collection Time: 03/07/21 10:52 AM   Specimen: Left Antecubital; Blood  Result Value Ref Range Status   Specimen Description   Final    LEFT ANTECUBITAL Performed at [place] [address] [place], [place] [address]    Special  Requests   Final    BOTTLES DRAWN AEROBIC AND ANAEROBIC Blood Culture adequate volume Performed at [facility]  [address] [place], [place] [address]    Culture   Final    NO GROWTH 3 DAYS Performed at [facility], [place] [address] [place], [place] [address]    Report Status PENDING  Incomplete  Culture, blood (routine x 2)     Status: None (Preliminary result)   Collection Time: 03/07/21 10:52 AM   Specimen: Left Antecubital; Blood  Result Value Ref Range Status   Specimen Description   Final    LEFT ANTECUBITAL Performed at [facility] [address], [place], [place] [address]    Special Requests   Final    BOTTLES DRAWN AEROBIC AND ANAEROBIC Blood Culture adequate volume Performed at [facility] [address], [place], [place] [address]    Culture   Final    NO GROWTH 3 DAYS Performed at [facility], [place] [address], [place],  [address]    Report Status PENDING  Incomplete         Radiology Studies: No results found.      Scheduled Meds:  sodium chloride   Intravenous Once   carvedilol  6.25 mg Oral BID WC   Chlorhexidine Gluconate Cloth  6 each Topical Daily   [START ON 03/11/2021] feeding supplement  1 Container Oral Q24H   feeding supplement  237 mL Oral Q24H   mouth rinse  15 mL Mouth Rinse BID   multivitamin with minerals  1 tablet Oral Daily   pantoprazole  40 mg Oral Daily   Continuous Infusions:  ampicillin (OMNIPEN) IV 2 g (03/10/21 1655)     LOS: 18 days    Time spent: over 30 min    Fayrene Helper, MD [facility]   To contact the attending provider between 7A-7P or the covering provider during after hours 7P-7A, please log into the web site [email or web address] and access using universal [place] password for that web site. If you do not have the password, please call the hospital operator.  03/10/2021, 7:21 PM

## 2021-03-09 NOTE — TOC Progression Note (Signed)
Transition of Care [facility]) - Progression Note    Patient Details  Name: EVELEAN BIGLER MRN: [id number] Date of Birth: 1965/04/14  Transition of [facility] CM/SW Contact  Ross Ludwig, [place] Phone Number: 03/09/2021, 2:58 PM  Clinical Narrative:    Patient still does not have any bed offers.  CSW updated FL2 and also sent updated clinicals to SNFs outside of [facility].  Patient's updated therapy notes have been sent to different facilities.   Expected Discharge Plan: Home/Self Care Barriers to Discharge: No Barriers Identified  Expected Discharge Plan and Services Expected Discharge Plan: Home/Self Care       Living arrangements for the past 2 months: Single Family Home                                       Social Determinants of Health (SDOH) Interventions    Readmission Risk Interventions Readmission Risk Prevention Plan 01/06/2021  Transportation Screening Complete  Medication Review Press photographer) Complete  PCP or Specialist appointment within 3-5 days of discharge Complete  HRI or Home Care Consult Complete  SW Recovery Care/Counseling Consult Complete  Palliative Care Screening Not [place] Complete  Some recent data might be hidden

## 2021-03-09 NOTE — Progress Notes (Signed)
PROGRESS NOTE    Erin [name]  XKG:[id number] DOB: 07/30/1965 DOA: 02/20/2021 PCP: Pcp, No  Chief Complaint  Patient presents with   Nausea    Brief Narrative:  Erin [name] is Erin [name] 56 y.o. 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 Patient presented secondary to nausea/vomiting/diarrhea and abdominal pain. Hospitalization complicated by recurrent anemia. Fecal occult blood test negative. Patient has received 5 units of PRBC to date. Platelets improving. Now found to have E. Faecalis bacteremia.    Assessment & Plan:   Principal Problem:   Severe anemia Active Problems:   Bacteremia due to Enterococcus   Adenocarcinoma (HCC)   Metastatic adenocarcinoma of unknown origin (HCC)   Severe sepsis (HCC)   Thrombocytopenia (HCC)   Acute metabolic encephalopathy   Hypokalemia   Essential hypertension   Leukocytosis   Protein-calorie malnutrition, severe   Cancer related pain   Diabetes mellitus (HCC)   OSA (obstructive sleep apnea)   Obesity hypoventilation syndrome (HCC)   * Severe anemia- (present on admission) Secondary to chemotherapy.  Continues to fluctuate, trend Patient has received 6 units of PRBC to date.  Bacteremia due to Enterococcus Infectious disease consulted. Likely GI source. Concern for possible endocarditis. Transthoracic Echocardiogram without evidence of vegetation. Repeat blood cultures (1/22) are no growth to date -ID recommendations: Ampicillin IV x 4 weeks.  Consider rechecking TTE and repeat blood cultures based on performance status and her candidacy for chemotherapy in the future.  Needs TEE and repeat cultures to assure clearance prior to restarting chemo to ensure port is not infected and no concerns for endocarditis.  Follow up with ID clinic if improvement in functional status. 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 Patient follows with Dr. Benay Spice and is receiving chemotherapy. Recent imaging significant for progression of disease. Palliative care consulted (1/14) and decision made at that time was for full code/full scop treatment. Currently awaiting SNF - no offers, has been declined - will need to follow up. if performance status and nutritional status don't improve, she won't be candidate for chemo -would need to consider hospice.  Severe sepsis [facility]) Not present on admission. Unsure of source. Port site does not appear infected. Associated leukocytosis, fevers, hypotension. Blood cultures (1/21) positive for GPCs with BCID suggesting enterococcus faecalis. Started on ampicillin IV. -See problem, Bacteremia due to Enterococcus  Acute metabolic encephalopathy Oxycodone reduced. Possibly some effect from underlying bacteremia. Somnolence is improved. Encephalopathy improved - increasing oxy slightly today, follow closely MRI 1/23 with motion artifact, no acute intracranial process EEG with moderate diffuse encephalopathy, no seizures or epileptiform discharges  Thrombocytopenia ([place]) Related to chemotherapy. resolved.  Hypokalemia- (present on admission) Potassium supplementation as needed  Essential hypertension- (present on admission) Patient is on hydrochlorothiazide, Coreg and amlodipine as an outpatient. Coreg and amlodipine continued. Blood pressure mostly well controlled but with hypotension in setting of worsened anemia and sepsis. -Continue Coreg - hold HCTZ and amlodipine  Leukocytosis Likely secondary to bacteremia/infection.  WBC still elevated, mild improvement - with fever, follow repeat cultures  Protein-calorie malnutrition, severe Patient continues to have poor oral intake  which may affect her candidacy for future treatment of cancer.  Dietitian recommendations: Continue Boost Breeze TID and Prosource Plus BID. Continue to provide encouragement with eating and  drinking as able.   Cancer related pain- (present on admission) Continue Oxycodone prn  Diabetes mellitus ([place]) Hemoglobin A1C of 7.1 Continue SSI  Hypotension-resolved as of 03/03/2021 Likely related to combination of anemia, sepsis and iatrogenic from antihypertensives.  Given Erin [name] 500 mL NS bolus. Resolved.   DVT prophylaxis: SCD Code Status: full Family Communication: none Disposition:   Status is: Inpatient  Remains inpatient appropriate because: continued IV abx, need for intermittent tranfusion       Consultants:  Oncology Palliative care  Procedures:  none  Antimicrobials:  Anti-infectives (From admission, onward)    Start     Dose/Rate Route Frequency Ordered Stop   03/02/21 0800  ampicillin (OMNIPEN) 2 g in sodium chloride 0.9 % 100 mL IVPB  Status:  Discontinued        2 g 300 mL/hr over 20 Minutes Intravenous Every 4 hours 03/02/21 0619 03/02/21 0643   03/02/21 0730  ampicillin (OMNIPEN) 2 g in sodium chloride 0.9 % 100 mL IVPB        2 g 300 mL/hr over 20 Minutes Intravenous Every 4 hours 03/02/21 0643         Subjective: No new complaints  Objective: Vitals:   03/08/21 1705 03/08/21 2004 03/09/21 0500 03/09/21 1246  BP: 106/77 127/69  103/70  Pulse: 95 97  94  Resp:  19  18  Temp:  99 F (37.2 C)  98.2 F (36.8 C)  TempSrc:  Oral  Oral  SpO2:    96%  Weight:   111.6 kg   Height:        Intake/Output Summary (Last 24 hours) at 03/09/2021 1442 Last data filed at 03/09/2021 0800 Gross per 24 hour  Intake 720 ml  Output 500 ml  Net 220 ml   Filed Weights   03/07/21 0500 03/08/21 0500 03/09/21 0500  Weight: 111.5 kg 111.9 kg 111.6 kg    Examination:  General: No acute distress. Cardiovascular: RRR Lungs: unlabored Abdomen: Soft, diffuse mild  ttp Neurological: Alert and oriented 3. Moves all extremities 4 w. Cranial nerves II through XII grossly intact. Skin: Warm and dry. No rashes or lesions. Extremities: No clubbing or cyanosis. No edema.  Data Reviewed: I have personally reviewed following labs and imaging studies  CBC: Recent Labs  Lab 03/05/21 0500 03/05/21 2030 03/06/21 0239 03/07/21 0507 03/08/21 0425 03/09/21 0145  WBC 22.2*  --  20.7* 18.5* 17.4* 14.1*  NEUTROABS  --   --  18.0* 15.9* 14.9* 12.0*  HGB 7.3* 7.4* 7.2* 7.7* 7.9* 7.2*  HCT 23.2* 23.6* 23.2* 25.1* 25.3* 22.8*  MCV 95.5  --  96.3 94.7 95.5 96.6  PLT 133*  --  140* 149* 159 761    Basic Metabolic Panel: Recent Labs  Lab 03/04/21 0348 03/06/21 0239 03/07/21 0507 03/08/21 0425 03/09/21 0145  NA 134* 135 134* 134* 133*  K 4.2 4.0 4.1 4.1 4.0  CL 105 106 105 106 105  CO2 21* 22 21* 20* 23  GLUCOSE 97 90 100* 89 97  BUN 28* 25* 22* 17 15  CREATININE 0.87 0.70 0.77 0.72 0.69  CALCIUM 8.3* 8.3* 8.4* 8.5* 8.5*  MG  --  2.2 2.0 1.9 1.9  PHOS  --  3.1 3.2 2.7 2.7    GFR: Estimated Creatinine Clearance: 97.2 mL/min (by C-G formula based on SCr of 0.69 mg/dL).  Liver Function Tests: Recent Labs  Lab 03/06/21 0239 03/07/21 0507 03/08/21 0425 03/09/21 0145  AST 19 18 18 17   ALT  9 10 10 8   ALKPHOS 168* 158* 169* 167*  BILITOT 1.2 1.6* 1.6* 1.3*  PROT 5.8* 5.9* 5.9* 5.7*  ALBUMIN <1.5* <1.5* <1.5* <1.5*    CBG: Recent Labs  Lab 03/08/21 1115 03/08/21 1622 03/08/21 2002 03/09/21 0750 03/09/21 1127  GLUCAP 107* 151* 87 90 113*     Recent Results (from the past 240 hour(s))  Culture, blood (routine x 2)     Status: Abnormal   Collection Time: 03/01/21 11:03 AM   Specimen: BLOOD  Result Value Ref Range Status   Specimen Description   Final    BLOOD RIGHT ANTECUBITAL Performed at [facility], [place] [address], [place], [place] [address]    Special Requests   Final    BOTTLES DRAWN AEROBIC AND ANAEROBIC  Blood Culture adequate volume Performed at [facility] [place] [facility] [address] [place], [place] [address]    Culture  Setup Time   Final    GRAM POSITIVE COCCI IN BOTH AEROBIC AND ANAEROBIC BOTTLES CRITICAL RESULT CALLED TO, READ BACK BY AND VERIFIED WITH: M LILLISTON,[email or web address]@[id number]  03/02/21 [place] Performed at [facility], [place] [address] [place], [place] [address]    Culture ENTEROCOCCUS FAECALIS (Erin [name])  Final   Report Status 03/04/2021 FINAL  Final   Organism ID, Bacteria ENTEROCOCCUS FAECALIS  Final      Susceptibility   Enterococcus faecalis - MIC*    AMPICILLIN <=2 SENSITIVE Sensitive     VANCOMYCIN 1 SENSITIVE Sensitive     GENTAMICIN SYNERGY SENSITIVE Sensitive     * ENTEROCOCCUS FAECALIS  Culture, blood (routine x 2)     Status: Abnormal   Collection Time: 03/01/21 11:03 AM   Specimen: BLOOD  Result Value Ref Range Status   Specimen Description   Final    BLOOD BLOOD LEFT HAND Performed at [facility], [place] [address], [place], [place] [address]    Special Requests   Final    BOTTLES DRAWN AEROBIC AND ANAEROBIC Blood Culture adequate volume Performed at [place] [address], [place], [place] [address]    Culture  Setup Time   Final    GRAM POSITIVE COCCI IN BOTH AEROBIC AND ANAEROBIC BOTTLES CRITICAL VALUE NOTED.  VALUE IS CONSISTENT WITH PREVIOUSLY REPORTED AND CALLED VALUE.    Culture (Xavius [place])  Final    ENTEROCOCCUS FAECALIS SUSCEPTIBILITIES PERFORMED ON PREVIOUS CULTURE WITHIN THE LAST 5 DAYS. Performed at [facility], [place] [address] [place],  [address]    Report Status 03/04/2021 FINAL  Final  Blood Culture ID Panel (Reflexed)     Status: Abnormal   Collection Time: 03/01/21 11:03 AM  Result Value Ref Range Status   Enterococcus faecalis DETECTED (Erin [name]) NOT DETECTED Final    Comment: CRITICAL RESULT CALLED TO, READ BACK BY AND VERIFIED WITH: M LILLISTON,[email or web address]@[id number]  03/02/21 [place]    Enterococcus  Faecium NOT DETECTED NOT DETECTED Final   Listeria monocytogenes NOT DETECTED NOT DETECTED Final   Staphylococcus species NOT DETECTED NOT DETECTED Final   Staphylococcus aureus (BCID) NOT DETECTED NOT DETECTED Final   Staphylococcus epidermidis NOT DETECTED NOT DETECTED Final   Staphylococcus lugdunensis NOT DETECTED NOT DETECTED Final   Streptococcus species NOT DETECTED NOT DETECTED Final   Streptococcus agalactiae NOT DETECTED NOT DETECTED Final   Streptococcus pneumoniae NOT DETECTED NOT DETECTED Final   Streptococcus pyogenes NOT DETECTED NOT DETECTED Final   Jamala Kohen.calcoaceticus-baumannii NOT DETECTED NOT DETECTED Final   Bacteroides fragilis NOT DETECTED NOT DETECTED Final  Enterobacterales NOT DETECTED NOT DETECTED Final   Enterobacter cloacae complex NOT DETECTED NOT DETECTED Final   Escherichia coli NOT DETECTED NOT DETECTED Final   Klebsiella aerogenes NOT DETECTED NOT DETECTED Final   Klebsiella oxytoca NOT DETECTED NOT DETECTED Final   Klebsiella pneumoniae NOT DETECTED NOT DETECTED Final   Proteus species NOT DETECTED NOT DETECTED Final   Salmonella species NOT DETECTED NOT DETECTED Final   Serratia marcescens NOT DETECTED NOT DETECTED Final   Haemophilus influenzae NOT DETECTED NOT DETECTED Final   Neisseria meningitidis NOT DETECTED NOT DETECTED Final   Pseudomonas aeruginosa NOT DETECTED NOT DETECTED Final   Stenotrophomonas maltophilia NOT DETECTED NOT DETECTED Final   Candida albicans NOT DETECTED NOT DETECTED Final   Candida auris NOT DETECTED NOT DETECTED Final   Candida glabrata NOT DETECTED NOT DETECTED Final   Candida krusei NOT DETECTED NOT DETECTED Final   Candida parapsilosis NOT DETECTED NOT DETECTED Final   Candida tropicalis NOT DETECTED NOT DETECTED Final   Cryptococcus neoformans/gattii NOT DETECTED NOT DETECTED Final   Vancomycin resistance NOT DETECTED NOT DETECTED Final    Comment: Performed at [facility], [place] [address], [place], [place]  [address]  Culture, blood (routine x 2)     Status: None   Collection Time: 03/02/21  4:18 PM   Specimen: BLOOD  Result Value Ref Range Status   Specimen Description   Final    BLOOD LEFT ANTECUBITAL Performed at [place] [address] [place], [place] [address]    Special Requests   Final    BOTTLES DRAWN AEROBIC ONLY Blood Culture adequate volume Performed at [place] [address], [place], [place] [address]    Culture   Final    NO GROWTH 5 DAYS Performed at [facility], [place] [address], [place], [place] [address]    Report Status 03/07/2021 FINAL  Final  Culture, blood (routine x 2)     Status: None   Collection Time: 03/02/21  4:18 PM   Specimen: BLOOD  Result Value Ref Range Status   Specimen Description   Final    BLOOD LEFT ANTECUBITAL Performed at [place] [address] [place], [place] [address]    Special Requests   Final    BOTTLES DRAWN AEROBIC AND ANAEROBIC Blood Culture adequate volume Performed at [place] [address], [place], [place] [address]    Culture   Final    NO GROWTH 5 DAYS Performed at [facility], [place] [address], [place], [place] [address]    Report Status 03/07/2021 FINAL  Final  Culture, blood (routine x 2)     Status: None (Preliminary result)   Collection Time: 03/07/21 10:52 AM   Specimen: Left Antecubital; Blood  Result Value Ref Range Status   Specimen Description   Final    LEFT ANTECUBITAL Performed at [place] [address] [place], [place] [address]    Special Requests   Final    BOTTLES DRAWN AEROBIC AND ANAEROBIC Blood Culture adequate volume Performed at [place] [address], [place], [place] [address]    Culture   Final    NO GROWTH 2 DAYS Performed at [place] [address] [place], [place] [address]    Report Status PENDING  Incomplete   Culture, blood (routine x 2)     Status: None (Preliminary result)   Collection Time: 03/07/21  10:52 AM   Specimen: Left Antecubital; Blood  Result Value Ref Range Status   Specimen Description   Final    LEFT ANTECUBITAL Performed at [facility], [place] [address] [place], [place] [address]    Special Requests   Final    BOTTLES DRAWN AEROBIC AND ANAEROBIC Blood Culture adequate volume Performed at [place] [address], [place], [place] [address]    Culture   Final    NO GROWTH 2 DAYS Performed at [place] [address], [place], [place] [address]    Report Status PENDING  Incomplete         Radiology Studies: No results found.      Scheduled Meds:  (feeding supplement) PROSource Plus  30 mL Oral BID BM   sodium chloride   Intravenous Once   carvedilol  6.25 mg Oral BID WC   Chlorhexidine Gluconate Cloth  6 each Topical Daily   feeding supplement  1 Container Oral BID BM   feeding supplement  237 mL Oral Q24H   insulin aspart  0-15 Units Subcutaneous TID WC   insulin aspart  0-5 Units Subcutaneous QHS   mouth rinse  15 mL Mouth Rinse BID   multivitamin with minerals  1 tablet Oral Daily   pantoprazole  40 mg Oral Daily   Continuous Infusions:  ampicillin (OMNIPEN) IV 2 g (03/09/21 1429)     LOS: 17 days    Time spent: over 30 min    Fayrene Helper, MD [facility]   To contact the attending provider between 7A-7P or the covering provider during after hours 7P-7A, please log into the web site [email or web address] and access using universal [place] password for that web site. If you do not have the password, please call the hospital operator.  03/09/2021, 2:42 PM

## 2021-03-10 DIAGNOSIS — D649 Anemia, unspecified: Secondary | ICD-10-CM | POA: Diagnosis not present

## 2021-03-10 LAB — CBC WITH DIFFERENTIAL/PLATELET
Abs Immature Granulocytes: 0.4 10*3/uL — ABNORMAL HIGH (ref 0.00–0.07)
Basophils Absolute: 0.1 10*3/uL (ref 0.0–0.1)
Basophils Relative: 0 %
Eosinophils Absolute: 0 10*3/uL (ref 0.0–0.5)
Eosinophils Relative: 0 %
HCT: 23.7 % — ABNORMAL LOW (ref 36.0–46.0)
Hemoglobin: 7.1 g/dL — ABNORMAL LOW (ref 12.0–15.0)
Immature Granulocytes: 3 %
Lymphocytes Relative: 8 %
Lymphs Abs: 1.1 10*3/uL (ref 0.7–4.0)
MCH: 29.2 pg (ref 26.0–34.0)
MCHC: 30 g/dL (ref 30.0–36.0)
MCV: 97.5 fL (ref 80.0–100.0)
Monocytes Absolute: 1 10*3/uL (ref 0.1–1.0)
Monocytes Relative: 7 %
Neutro Abs: 11.6 10*3/uL — ABNORMAL HIGH (ref 1.7–7.7)
Neutrophils Relative %: 82 %
Platelets: 149 10*3/uL — ABNORMAL LOW (ref 150–400)
RBC: 2.43 MIL/uL — ABNORMAL LOW (ref 3.87–5.11)
RDW: 17.2 % — ABNORMAL HIGH (ref 11.5–15.5)
WBC: 14.2 10*3/uL — ABNORMAL HIGH (ref 4.0–10.5)
nRBC: 0.1 % (ref 0.0–0.2)

## 2021-03-10 LAB — BPAM RBC
Blood Product Expiration Date: 202302222359
ISSUE DATE / TIME: 202301270022
Unit Type and Rh: 5100

## 2021-03-10 LAB — HEMOGLOBIN AND HEMATOCRIT, BLOOD
HCT: 24.9 % — ABNORMAL LOW (ref 36.0–46.0)
Hemoglobin: 7.8 g/dL — ABNORMAL LOW (ref 12.0–15.0)

## 2021-03-10 LAB — COMPREHENSIVE METABOLIC PANEL
ALT: 9 U/L (ref 0–44)
AST: 18 U/L (ref 15–41)
Albumin: <1.5 g/dL — ABNORMAL LOW (ref 3.5–5.0)
Alkaline Phosphatase: 159 U/L — ABNORMAL HIGH (ref 38–126)
Anion gap: 5 (ref 5–15)
BUN: 15 mg/dL (ref 6–20)
CO2: 23 mmol/L (ref 22–32)
Calcium: 8.5 mg/dL — ABNORMAL LOW (ref 8.9–10.3)
Chloride: 105 mmol/L (ref 98–111)
Creatinine, Ser: 0.69 mg/dL (ref 0.44–1.00)
GFR, Estimated: >60 mL/min (ref >60)
Glucose, Bld: 103 mg/dL — ABNORMAL HIGH (ref 70–99)
Potassium: 4.1 mmol/L (ref 3.5–5.1)
Sodium: 133 mmol/L — ABNORMAL LOW (ref 135–145)
Total Bilirubin: 1 mg/dL (ref 0.3–1.2)
Total Protein: 5.6 g/dL — ABNORMAL LOW (ref 6.5–8.1)

## 2021-03-10 LAB — GLUCOSE, CAPILLARY
Glucose-Capillary: 90 mg/dL (ref 70–99)
Glucose-Capillary: 89 mg/dL (ref 70–99)
Glucose-Capillary: 101 mg/dL — ABNORMAL HIGH (ref 70–99)
Glucose-Capillary: 91 mg/dL (ref 70–99)

## 2021-03-10 LAB — TYPE AND SCREEN
ABO/RH(D): O POS
Antibody Screen: NEGATIVE
Unit division: 0

## 2021-03-10 LAB — PHOSPHORUS: Phosphorus: 2.9 mg/dL (ref 2.5–4.6)

## 2021-03-10 LAB — MAGNESIUM: Magnesium: 2 mg/dL (ref 1.7–2.4)

## 2021-03-10 MED ORDER — BOOST / RESOURCE BREEZE PO LIQD CUSTOM
1.0000 | ORAL | Status: DC
  Administered 2021-03-11: 1 via ORAL

## 2021-03-10 NOTE — Progress Notes (Signed)
Nutrition Follow-up  DOCUMENTATION CODES:   Severe malnutrition in context of chronic illness, Morbid obesity  INTERVENTION:  - will d/c Prosource Plus. - will decrease Boost Breeze from BID to once/day. - continue Ensure Enlive/Plus High Protein once/day. - will communicate with Medical Oncology team.   NUTRITION DIAGNOSIS:   Severe Malnutrition related to chronic illness, cancer and cancer related treatments as evidenced by mild fat depletion, mild muscle depletion, percent weight loss, energy intake < 75% for > or equal to 1 month. -ongoing  GOAL:   Patient will meet greater than or equal to 90% of their needs -unmet  MONITOR:   PO intake, Supplement acceptance, Labs, Weight trends, I & O's  ASSESSMENT:   56 y.o. female with medical history of poorly differentiated metastatic adenocarcinoma with suspicion of gastric primary, undergoing chemo (folfox, last: 01/27/21), OSA, OHS, gastric cancer, HTN, and DM. she presented to the ED due to N/V/D, abdominal cramping, and weakness.  Flow sheet documentation from meal since breakfast on 1/23 indicate she is mainly eating <20% at meals but it is documented that she ate 35% of breakfast on 1/23 and 100% of lunch on 1/24.  Review of oral nutrition supplement orders indicate she is accepting them 75-100% of the time offered.  Able to communicate with RN who shares that documentation about oral nutrition supplement acceptance is misleading and that she and other nurses have experienced that patient refuses all supplements. RN also shares that patient is not eating and that even family is unable to get her to eat.    Weight has been fairly stable since 1/16.non-pitting edema to BLE documented in the edema section of flow sheet. She is noted to be -99 ml since 1/16.  Patient remains Full Code. TOC note this AM indicates patient's information was faxed to SNFs for the 3rd time but she has no bed offered related to her insurance.   Oncology  notes indicate patient will not be a candidate for further chemo unless her performance status and nutritional status improve.    Labs reviewed; CBGs: 90 and 89 mg/dl, Na: 133 mmol/l, Ca: 8.5 mg/dl, Alk Phos: 159 u/l (down from 1/29).  Medications reviewed; sliding scale novolog, 1 tablet multivitamin with minerals/day, 40 mg oral protonix/day.    Diet Order:   Diet Order             Diet regular Room service appropriate? Yes; Fluid consistency: Thin  Diet effective now                   EDUCATION NEEDS:   No education needs have been identified at this time  Skin:  Skin Assessment: Reviewed RN Assessment  Last BM:  1/25 (type 6, medium amount)  Height:   Ht Readings from Last 1 Encounters:  02/20/21 '5\' 4"'  (1.626 m)    Weight:   Wt Readings from Last 1 Encounters:  03/09/21 111.6 kg     Estimated Nutritional Needs:  Kcal:  0940-7680 kcal Protein:  115-130 grams Fluid:  >/= 2.2 L/day      Jarome Matin, MS, RD, LDN Inpatient Clinical Dietitian RD pager # available in [place]  After hours/weekend pager # available in [facility]

## 2021-03-10 NOTE — Progress Notes (Signed)
Pt refused CPAP for tonight instructed patient to called if she changes her mind about putting machine on. RT is available, machine in room on standby

## 2021-03-10 NOTE — Progress Notes (Signed)
Physical Therapy Treatment Patient Details Name: Erin [name] MRN: [id number] DOB: 1966-02-04 Today's Date: 03/10/2021   History of Present Illness 56 y.o. 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 Patient presented secondary to nausea/vomiting/diarrhea and abdominal pain. Hospitalization complicated by recurrent anemia. Fecal occult blood test negative. Patient has received 5 units of PRBC to date. Platelets improving. Now found to have E. Faecalis bacteremia.    PT Comments    Pt continues to require +2 assist for mobility. She remains weak and fatigues quickly with activity. Continue to recommend ST SNF for rehab.    Recommendations for follow up therapy are one component of a multi-disciplinary discharge planning process, led by the attending physician.  Recommendations may be updated based on patient status, additional functional criteria and insurance authorization.  Follow Up Recommendations  Skilled nursing-short term rehab (<3 hours/day)     Assistance Recommended at Discharge    Patient can return home with the following A lot of help with walking and/or transfers;A lot of help with bathing/dressing/bathroom;Help with stairs or ramp for entrance;Two people to help with walking and/or transfers;Two people to help with bathing/dressing/bathroom;Assist for transportation;Assistance with cooking/housework   Equipment Recommendations  None recommended by PT    Recommendations for Other Services       Precautions / Restrictions Precautions Precautions: Fall Restrictions Weight Bearing Restrictions: No     Mobility  Bed Mobility Overal bed mobility: Needs Assistance Bed Mobility: Supine to Sit     Supine to sit: Mod assist, +2 for physical assistance, +2 for safety/equipment, HOB elevated Sit to supine: Mod assist, +2 for physical  assistance, +2 for safety/equipment, HOB elevated   General bed mobility comments: Assist for trunk, LEs, and to scoot to EOB. Utilized bedpad to aid with positioning. Pt used bedrail. Increased time.    Transfers Overall transfer level: Needs assistance Equipment used: Rolling walker (2 wheels) Transfers: Sit to/from Stand Sit to Stand: Mod assist, +2 physical assistance, +2 safety/equipment, From elevated surface           General transfer comment: Assist to power up, stabilize, control descent. Cues for safety, technique. Increased time.    Ambulation/Gait Ambulation/Gait assistance: Min assist, +2 safety/equipment   Assistive device: Rolling walker (2 wheels)         General Gait Details: side steps along bedside with RW. Increased time. Fatigues quickly.   Stairs             Wheelchair Mobility    Modified Rankin (Stroke Patients Only)       Balance Overall balance assessment: Needs assistance         Standing balance support: Bilateral upper extremity supported, Reliant on assistive device for balance Standing balance-Leahy Scale: Poor                              Cognition Arousal/Alertness: Awake/alert Behavior During Therapy: Flat affect Overall Cognitive Status: Impaired/Different from baseline                   Orientation Level: Disoriented to, Time                      Exercises Total Joint Exercises Ankle Circles/Pumps: AROM, Both, 10 reps Quad Sets: AROM, Both, 10 reps Hip ABduction/ADduction: AAROM, Both, 10 reps    General Comments  Pertinent Vitals/Pain Pain Assessment Pain Assessment: Faces Faces Pain Scale: Hurts whole lot Pain Location: lower pelvic/center/ABD Pain Descriptors / Indicators: Discomfort, Sore, Grimacing Pain Intervention(s): Limited activity within patient's tolerance, Monitored during session, Repositioned    Home Living                          Prior  Function            PT Goals (current goals can now be found in the care plan section) Progress towards PT goals: Progressing toward goals    Frequency    Min 3X/week (no bed offers so will need to work with her at least 3x/week)      PT Plan Current plan remains appropriate;Frequency needs to be updated    Co-evaluation              AM-PAC PT "6 Clicks" Mobility   Outcome Measure  Help needed turning from your back to your side while in a flat bed without using bedrails?: A Lot Help needed moving from lying on your back to sitting on the side of a flat bed without using bedrails?: A Lot Help needed moving to and from a bed to a chair (including a wheelchair)?: A Lot Help needed standing up from a chair using your arms (e.g., wheelchair or bedside chair)?: A Lot Help needed to walk in hospital room?: Total Help needed climbing 3-5 steps with a railing? : Total 6 Click Score: 10    End of Session Equipment Utilized During Treatment: Gait belt Activity Tolerance: Patient limited by fatigue Patient left: in bed;with call bell/phone within reach;with bed alarm set   PT Visit Diagnosis: Difficulty in walking, not elsewhere classified (R26.2);Muscle weakness (generalized) (M62.81)     Time: 9480-1655 PT Time Calculation (min) (ACUTE ONLY): 28 min  Charges:  $Gait Training: 8-22 mins $Therapeutic Exercise: 8-22 mins                         Doreatha Massed, PT Acute Rehabilitation  Office: [phone] Pager: [phone]

## 2021-03-10 NOTE — TOC Progression Note (Signed)
Transition of Care [facility]) - Progression Note    Patient Details  Name: AMYRIE ILLINGWORTH MRN: [id number] Date of Birth: 10-22-1965  Transition of Care [facility]) CM/SW Contact  Purcell Mouton, RN Phone Number: 03/10/2021, 10:59 AM  Clinical Narrative:     FL2 was updated. Pt was faxed to SNF for the 3rd time. No one is offering a bed related to pt's insurance. Pt will need to go home with [facility].   Expected Discharge Plan: Home/Self Care Barriers to Discharge: No Barriers Identified  Expected Discharge Plan and Services Expected Discharge Plan: Home/Self Care       Living arrangements for the past 2 months: Single Family Home                                       Social Determinants of Health (SDOH) Interventions    Readmission Risk Interventions Readmission Risk Prevention Plan 01/06/2021  Transportation Screening Complete  Medication Review Press photographer) Complete  PCP or Specialist appointment within 3-5 days of discharge Complete  HRI or Home Care Consult Complete  SW Recovery Care/Counseling Consult Complete  Palliative Care Screening Not [place] Complete  Some recent data might be hidden

## 2021-03-10 NOTE — Progress Notes (Addendum)
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 -Bladder neck, urethra, cervical biopsies 12/17/2020 -MSS, tumor mutation burden 4, K-ras amplification, PD-L1 tumor proportion score-0 -[facility] -90% intestinal malignancy (colorectal adenocarcinoma 85%) small intestine adenocarcinoma less than 5%, gastroesophageal adenocarcinoma not excluded -12/05/2020 MRI of the pelvis-poorly marginated enhancing 3.7 x 2.8 x 3.0 cm mass centered at the urethra, solid avidly enhancing bilateral ovarian masses, poorly marginated enhancing 2.4 x 2.3 x 3.0 cm mass in the left uterine cervix, mild to moderate bilateral common iliac, bilateral external iliac, and bilateral inguinal lymphadenopathy, diffuse patchy confluent nodular replacement of the pelvic osseous structures. -12/13/2020 CEA 1864, CA125 29.2 -12/16/2020 CT abdomen/pelvis-rapidly increasing size of the left ovary with some adjacent ascites, enlarging masses elsewhere, masslike area of the cervix and potentially within a urethral diverticulum, well-circumscribed left adrenal lesion measuring 3.2 x 2.6 cm, heterogeneous pattern of subtle sclerosis and lucency in the spine. -12/16/2020 MRI of the lumbar spine-diffusely abnormal appearance of the bone marrow throughout the visualized lumbar spine and pelvis highly suspicious for diffuse osseous metastatic disease, retroperitoneal/iliac adenopathy with left larger than right adnexal masses. -12/17/2020 CT chest-no acute intrathoracic pathology -Upper endoscopy 12/22/2020-gastritis, gastric nodule biopsy-adenocarcinoma, poorly differentiated with signet ring morphology -Colonoscopy 12/22/2020-polyps removed from the ascending and sigmoid colon, extrinsic compression of the sigmoid colon-tubulovillous adenoma without high-grade dysplasia, tubular adenoma and hyperplastic polyps -12/27/2020 cycle #1 FOLFOX -01/10/2021  cycle #2 FOLFOX -01/27/2021 cycle 3 FOLFOX, oxaliplatin dose reduced -02/20/2021 CT abdomen/pelvis without contrast-increased size of the bilateral ovarian masses, interval diffuse bony metastatic disease, progressive metastatic bilateral pelvic and inguinal lymphadenopathy, increased size of the left adrenal metastasis, interval nodular densities at the lung bases suspicious for metastases, interval mild right hydronephrosis and hydroureter -02/22/2021 CEA 440 2.  Abdominal pain secondary #1 3.  Constipation 4.  Normocytic anemia 5.  Leukocytosis 6.  Protein calorie malnutrition 7.  Obstructive sleep apnea 8.  Diabetes mellitus 9.  Hypertension 10.  Possible pulmonary hypertension noted on CT chest 11.  Hospital admission 02/20/2021-intractable nausea and vomiting 12.  Right hydronephrosis and hydroureter 13.  Thrombocytopenia 14.  Low-grade fever 15.  E faecalis bacteremia  Erin [name] appears unchanged.  Repeat blood cultures negative.  She remains afebrile.  ID has recommended antibiotics x4 weeks for treatment of bacteremia. The bacteremia is likely due to cancer in her GI tract.  She continues to have persistent leukocytosis which may be a leukemoid reaction from her cancer as this predates this admission.  Leukocytosis improving.  Platelet count overall stable.  Pain controlled with current pain medication.  Erin [name] understands that she is not a candidate for further chemotherapy unless her performance status and nutritional status improve.  Recommendations: 1.  Continue supportive care and antibiotics per ID/primary team.   2.  Monitor CBC 3.  Hold on Port-A-Cath removal unless repeat blood cultures are positive. 4.  Awaiting SNF placement.  Unless her performance status and nutritional status improved, she is not a candidate for additional chemotherapy.  If these do not improve, we would recommend hospice. 5.  Consider stopping CBG checks  She currently has outpatient  follow-up scheduled for later this week.  We will likely need to delay this appointment if she is not discharged prior to that date.  [name], [name], [name], [name]  Erin [name] was interviewed and examined.  She is waiting on skilled nursing facility placement.  She remains mildly confused.  The repeat blood cultures are negative.  The persistent neutrophilia is likely related to a leukemoid reaction from cancer.  The neutrophil count was elevated prior to beginning chemotherapy last fall.  We will reschedule outpatient follow-up at the Cancer center.  I was present for greater than 50% of today's visit.  I performed medical decision making.   SUBJECTIVE: Remains mildly confused this morning.  Pain controlled.  No new complaints.  Oncology History  Gastric cancer ([place])  12/27/2020 Initial Diagnosis   Gastric cancer ([place])   12/27/2020 -  Chemotherapy   Patient is on Treatment Plan : GASTRIC FOLFOX q14d x 12 cycles      PHYSICAL EXAMINATION:  Vitals:   03/09/21 2012 03/10/21 0532  BP: 102/67 108/67  Pulse: 92 95  Resp: 20 20  Temp: 99.2 F (37.3 C) 98.2 F (36.8 C)  SpO2: 100% 97%   Filed Weights   03/07/21 0500 03/08/21 0500 03/09/21 0500  Weight: 111.5 kg 111.9 kg 111.6 kg    Intake/Output from previous day: 01/29 0701 - 01/30 0700 In: 240 [P.O.:240] Out: 600 [Urine:600] HEENT: No thrush or bleeding Abdomen: Soft, tender in the left lower abdomen Vascular: No leg edema Skin: Petechial rash has resolved  Port-A-Cath site without erythema  LABORATORY DATA:  I have reviewed the data as listed CMP Latest Ref Rng & Units 03/10/2021 03/09/2021 03/08/2021  Glucose 70 - 99 mg/dL 103(H) 97 89  BUN 6 - 20 mg/dL '15 15 17  ' Creatinine 0.44 - 1.00 mg/dL 0.69 0.69 0.72  Sodium 135 - 145 mmol/L 133(L) 133(L) 134(L)  Potassium 3.5 - 5.1 mmol/L 4.1 4.0 4.1  Chloride 98 - 111 mmol/L 105 105 106  CO2 22 - 32 mmol/L 23 23 20(L)  Calcium 8.9 - 10.3 mg/dL 8.5(L) 8.5(L)  8.5(L)  Total Protein 6.5 - 8.1 g/dL 5.6(L) 5.7(L) 5.9(L)  Total Bilirubin 0.3 - 1.2 mg/dL 1.0 1.3(H) 1.6(H)  Alkaline Phos 38 - 126 U/L 159(H) 167(H) 169(H)  AST 15 - 41 U/L '18 17 18  ' ALT 0 - 44 U/L '9 8 10    ' Lab Results  Component Value Date   WBC 14.2 (H) 03/10/2021   HGB 7.1 (L) 03/10/2021   HCT 23.7 (L) 03/10/2021   MCV 97.5 03/10/2021   PLT 149 (L) 03/10/2021   NEUTROABS 11.6 (H) 03/10/2021    Lab Results  Component Value Date   CEA1 440.0 (H) 02/22/2021   CEA 617.97 (H) 02/11/2021    CT Abdomen Pelvis Wo Contrast  Result Date: 02/20/2021 CLINICAL DATA:  Acute, non localized abdominal pain. Nausea, vomiting, diarrhea and fatigue for the past week. Receiving chemotherapy for gastric and ovarian cancer. EXAM: CT ABDOMEN AND PELVIS WITHOUT CONTRAST TECHNIQUE: Multidetector CT imaging of the abdomen and pelvis was performed following the standard protocol without IV contrast. RADIATION DOSE REDUCTION: This exam was performed according to the departmental dose-optimization program which includes automated exposure control, adjustment of the mA and/or kV according to patient size and/or use of iterative reconstruction technique. COMPARISON:  Chest CT dated 12/17/2020. Abdomen and pelvis CT dated 12/16/2020. FINDINGS: Lower chest: Mildly enlarged heart. Small amount of residual atelectasis/scarring in the right lower lobe, including a somewhat oval wedge-shaped area at the posterior right lung base which is significantly smaller. Interval visualization of a 5 mm nodular density at the right lateral costophrenic angle on image number 20/6. There is a similar area more anteriorly on image number 22/6. There is also an interval 11 x 8 mm sub solid nodular density in the right middle lobe  on image number 9/6. There is also an interval nodular density in the left lower lobe with eccentric cavitation measuring 11 x 7 mm on image number 8/6. Hepatobiliary: No focal liver abnormality is seen. No  gallstones, gallbladder wall thickening, or biliary dilatation. Pancreas: Unremarkable. No pancreatic ductal dilatation or surrounding inflammatory changes. Spleen: Normal in size without focal abnormality. Adrenals/Urinary Tract: An exophytic posterior left adrenal mass is again demonstrated. This measures 3.2 x 2.8 cm on image number 19/2, previously 3.2 x 2.6 cm. Stable normal appearing right adrenal gland. Interval mild dilatation of the right renal collecting system and ureter to the level of the ureterovesical junction with no obstructing stone or mass visualized. Stable normal appearing left kidney and ureter. The urinary bladder is poorly distended with mild to moderate diffuse wall thickening. Stomach/Bowel: A small hiatal hernia is unchanged. Unremarkable small bowel and colon. Normal appearing appendix. Vascular/Lymphatic: Bilateral enlarged pelvic and inguinal lymph nodes with progression. The largest is a left pelvic sidewall node with a short axis diameter of 17 mm on image number 75/2, previously 14 mm. Reproductive: A large left adnexal mass is again demonstrated with an interval increase in size. This measures 13.0 x 8.2 cm on image number 64/2, previously 10.8 x 8.8 cm. A right adnexal mass is again demonstrated, currently measuring 3.4 x 2.7 cm on image number 67/2, previously 2.9 cm in maximum diameter. Normal appearing uterus. Other: Small umbilical and infraumbilical hernias containing fat. Small left inguinal hernia containing fat. Musculoskeletal: Interval diffuse heterogeneous bone sclerosis. IMPRESSION: 1. Increased size of bilateral ovarian masses, especially on the left. 2. Interval diffuse bony metastatic disease. 3. Progressive metastatic bilateral pelvic and inguinal lymphadenopathy. 4. Increased size of a left adrenal metastasis. 5. Interval nodular densities at the lung bases suspicious for metastases. 6. Interval mild right hydronephrosis and hydroureter to the level of the  ureterovesical junction. This could be due to recent stone passage, obstruction by wall thickening of the bladder or a nonvisualized obstructing ureteral metastasis. Electronically Signed   By: Claudie Revering M.D.   On: 02/20/2021 18:28   CT HEAD WO CONTRAST (5MM)  Result Date: 02/22/2021 CLINICAL DATA:  Encephalopathy EXAM: CT HEAD WITHOUT CONTRAST TECHNIQUE: Contiguous axial images were obtained from the base of the skull through the vertex without intravenous contrast. RADIATION DOSE REDUCTION: This exam was performed according to the departmental dose-optimization program which includes automated exposure control, adjustment of the mA and/or kV according to patient size and/or use of iterative reconstruction technique. COMPARISON:  None. FINDINGS: Brain: There is no mass, hemorrhage or extra-axial collection. The size and configuration of the ventricles and extra-axial CSF spaces are normal. The brain parenchyma is normal, without acute or chronic infarction. Vascular: No abnormal hyperdensity of the major intracranial arteries or dural venous sinuses. No intracranial atherosclerosis. Skull: The visualized skull base, calvarium and extracranial soft tissues are normal. Sinuses/Orbits: No fluid levels or advanced mucosal thickening of the visualized paranasal sinuses. No mastoid or middle ear effusion. The orbits are normal. IMPRESSION: Normal head CT. Electronically Signed   By: Ulyses Jarred M.D.   On: 02/22/2021 22:14   MR BRAIN W WO CONTRAST  Result Date: 03/03/2021 CLINICAL DATA:  Metastatic disease evaluation EXAM: MRI HEAD WITHOUT AND WITH CONTRAST TECHNIQUE: Multiplanar, multiecho pulse sequences of the brain and surrounding structures were obtained without and with intravenous contrast. CONTRAST:  86m GADAVIST GADOBUTROL 1 MMOL/ML IV SOLN COMPARISON:  No prior MRI, correlation is made with CT head 02/22/2021 FINDINGS: Evaluation is  somewhat limited by motion artifact, particularly on postcontrast  imaging. Brain: No restricted diffusion to suggest acute or subacute infarct. No acute hemorrhage, mass, mass effect, or midline shift. No foci of hemosiderin deposition to suggest remote hemorrhage. Scattered T2 hyperintense signal in the periventricular white matter, likely the sequela of mild chronic small vessel ischemic disease. No hydrocephalus or extra-axial collection. Vascular: Normal flow voids. Skull and upper cervical spine: No suspicious osseous lesions. Sinuses/Orbits: Negative. Other: None. IMPRESSION: Evaluation is limited by motion artifact, particularly on postcontrast imaging. Within this limitation no definite enhancing foci to suggest metastatic disease. No acute intracranial process. Electronically Signed   By: Merilyn Baba M.D.   On: 03/03/2021 23:47   DG CHEST PORT 1 VIEW  Result Date: 02/20/2021 CLINICAL DATA:  History of gastric and ovarian cancer. Fatigue, nausea, vomiting, diarrhea EXAM: PORTABLE CHEST 1 VIEW COMPARISON:  12/31/2020 FINDINGS: Right Port-A-Cath in place with the tip at the cavoatrial junction. Heart is borderline in size. Mild vascular congestion. No confluent airspace opacities, effusions or overt edema. No acute bony abnormality. IMPRESSION: Borderline heart size.  Mild vascular congestion. Electronically Signed   By: Rolm Baptise M.D.   On: 02/20/2021 21:30   EEG adult  Result Date: 03/07/2021 Lora Havens, MD     03/07/2021  8:48 AM Patient Name: Erin [name] MRN: [id number] Epilepsy Attending: Lora Havens Referring Physician/Provider: Elodia Florence., MD Date: 03/06/2021 Duration: 21.53 mins Patient history: 56 year old female with altered mental status.  EEG to evaluate for seizure. Level of alertness: Awake, asleep AEDs during EEG study: None Technical aspects: This EEG study was done with scalp electrodes positioned according to the 10-20 International system of electrode placement. Electrical activity was acquired at a sampling rate  of '500Hz'  and reviewed with a high frequency filter of '70Hz'  and a low frequency filter of '1Hz' . EEG data were recorded continuously and digitally stored. Description: The posterior dominant rhythm consists of 7 Hz activity of moderate voltage (25-35 uV) seen predominantly in posterior head regions, symmetric and reactive to eye opening and eye closing. Sleep was characterized by sleep spindles (12 to 14 Hz), maximal frontocentral region.  EEG showed continuous generalized 3 to 6 Hz theta-delta slowing. Hyperventilation and photic stimulation were not performed.   ABNORMALITY - Continuous slow, generalized - Background slow IMPRESSION: This study is suggestive of moderate diffuse encephalopathy, nonspecific etiology. No seizures or epileptiform discharges were seen throughout the recording. Lora Havens   ECHOCARDIOGRAM COMPLETE  Result Date: 03/02/2021    ECHOCARDIOGRAM REPORT   Patient Name:   Erin [name] Date of Exam: 03/02/2021 Medical Rec #:  [id number]           Height:       64.0 in Accession #:    [id number]          Weight:       246.7 lb Date of Birth:  21-Apr-1965           BSA:          2.139 m Patient Age:    56 years            BP:           99/59 mmHg Patient Gender: F                   HR:           100 bpm. Exam Location:  Inpatient Procedure: 2D Echo, Cardiac Doppler and Color  Doppler Indications:    BACTEREMIA  History:        Patient has no prior history of Echocardiogram examinations.                 Signs/Symptoms:Shortness of Breath and Bacteremia; Risk                 Factors:Hypertension and Diabetes. HLD.  Sonographer:    Beryle Beams Referring Phys: [id number] [place] T VU  Sonographer Comments: Patient is morbidly obese. PATIENT REFUSED SSN/ ALSO HOLDING BREATHE DURING STUDY. IMPRESSIONS  1. Left ventricular ejection fraction, by estimation, is 60 to 65%. The left ventricle has normal function. The left ventricle has no regional wall motion abnormalities. Left ventricular diastolic  parameters are consistent with Grade I diastolic dysfunction (impaired relaxation).  2. Right ventricular systolic function is normal. The right ventricular size is normal. There is mildly elevated pulmonary artery systolic pressure.  3. A small pericardial effusion is present.  4. The mitral valve is normal in structure. No evidence of mitral valve regurgitation. No evidence of mitral stenosis.  5. The aortic valve is tricuspid. There is moderate calcification of the aortic valve. There is mild thickening of the aortic valve. Aortic valve regurgitation is not visualized. No aortic stenosis is present.  6. The inferior vena cava is normal in size with greater than 50% respiratory variability, suggesting right atrial pressure of 3 mmHg. FINDINGS  Left Ventricle: Left ventricular ejection fraction, by estimation, is 60 to 65%. The left ventricle has normal function. The left ventricle has no regional wall motion abnormalities. The left ventricular internal cavity size was normal in size. There is  no left ventricular hypertrophy. Left ventricular diastolic parameters are consistent with Grade I diastolic dysfunction (impaired relaxation). Normal left ventricular filling pressure. Right Ventricle: The right ventricular size is normal. No increase in right ventricular wall thickness. Right ventricular systolic function is normal. There is mildly elevated pulmonary artery systolic pressure. The tricuspid regurgitant velocity is 3.21  m/s, and with an assumed right atrial pressure of 3 mmHg, the estimated right ventricular systolic pressure is 36.1 mmHg. Left Atrium: Left atrial size was normal in size. Right Atrium: Right atrial size was normal in size. Pericardium: A small pericardial effusion is present. Mitral Valve: The mitral valve is normal in structure. No evidence of mitral valve regurgitation. No evidence of mitral valve stenosis. Tricuspid Valve: The tricuspid valve is normal in structure. Tricuspid valve  regurgitation is mild . No evidence of tricuspid stenosis. Aortic Valve: The aortic valve is tricuspid. There is moderate calcification of the aortic valve. There is mild thickening of the aortic valve. Aortic valve regurgitation is not visualized. No aortic stenosis is present. Aortic valve mean gradient measures 3.5 mmHg. Aortic valve peak gradient measures 7.3 mmHg. Aortic valve area, by VTI measures 2.13 cm. Pulmonic Valve: The pulmonic valve was normal in structure. Pulmonic valve regurgitation is mild. No evidence of pulmonic stenosis. Aorta: The aortic root is normal in size and structure. Venous: The inferior vena cava is normal in size with greater than 50% respiratory variability, suggesting right atrial pressure of 3 mmHg. IAS/Shunts: No atrial level shunt detected by color flow Doppler.  LEFT VENTRICLE PLAX 2D LVIDd:         4.40 cm     Diastology LVIDs:         2.30 cm     LV e' medial:    9.79 cm/s LV PW:  1.10 cm     LV E/e' medial:  7.7 LV IVS:        0.90 cm     LV e' lateral:   12.60 cm/s LVOT diam:     2.00 cm     LV E/e' lateral: 6.0 LV SV:         42 LV SV Index:   20 LVOT Area:     3.14 cm  LV Volumes (MOD) LV vol d, MOD A2C: 50.2 ml LV vol d, MOD A4C: 38.9 ml LV vol s, MOD A2C: 8.5 ml LV vol s, MOD A4C: 9.6 ml LV SV MOD A2C:     41.7 ml LV SV MOD A4C:     38.9 ml LV SV MOD BP:      36.7 ml RIGHT VENTRICLE             IVC RV S prime:     17.90 cm/s  IVC diam: 2.00 cm TAPSE (M-mode): 2.4 cm LEFT ATRIUM             Index        RIGHT ATRIUM           Index LA diam:        4.30 cm 2.01 cm/m   RA Area:     16.70 cm LA Vol (A2C):   44.6 ml 20.85 ml/m  RA Volume:   48.50 ml  22.68 ml/m LA Vol (A4C):   49.8 ml 23.28 ml/m LA Biplane Vol: 48.1 ml 22.49 ml/m  AORTIC VALVE                    PULMONIC VALVE AV Area (Vmax):    1.98 cm     PV Vmax:       0.89 m/s AV Area (Vmean):   1.98 cm     PV Vmean:      55.000 cm/s AV Area (VTI):     2.13 cm     PV VTI:        0.118 m AV Vmax:            135.50 cm/s  PV Peak grad:  3.1 mmHg AV Vmean:          88.550 cm/s  PV Mean grad:  1.0 mmHg AV VTI:            0.196 m AV Peak Grad:      7.3 mmHg AV Mean Grad:      3.5 mmHg LVOT Vmax:         85.25 cm/s LVOT Vmean:        55.800 cm/s LVOT VTI:          0.133 m LVOT/AV VTI ratio: 0.68  AORTA Ao Root diam: 2.90 cm Ao Asc diam:  3.10 cm MITRAL VALVE                TRICUSPID VALVE MV Area (PHT): 3.11 cm     TV Peak grad:   33.4 mmHg MV Decel Time: 244 msec     TV Mean grad:   22.0 mmHg MV E velocity: 75.30 cm/s   TV Vmax:        2.89 m/s MV A velocity: 122.00 cm/s  TV Vmean:       224.0 cm/s MV E/A ratio:  0.62         TV VTI:         0.78 msec  TR Peak grad:   41.2 mmHg                             TR Vmax:        321.00 cm/s                              SHUNTS                             Systemic VTI:  0.13 m                             Systemic Diam: 2.00 cm Skeet Latch MD Electronically signed by Skeet Latch MD Signature Date/Time: 03/02/2021/3:52:35 PM    Final      Future Appointments  Date Time Provider [place]  03/13/2021 12:45 PM [facility] PHLEBOTOMIST [facility] None  03/13/2021  1:00 PM [facility] FLUSH ROOM [facility] None  03/13/2021  1:15 PM Owens Shark, NP [facility] None       LOS: 18 days

## 2021-03-10 NOTE — Evaluation (Signed)
Occupational Therapy Evaluation Patient Details Name: DESHANDA MOLITOR MRN: [id number] DOB: 1965-04-11 Today's Date: 03/10/2021   History of Present Illness 56 y.o. 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 Patient presented secondary to nausea/vomiting/diarrhea and abdominal pain. Hospitalization complicated by recurrent anemia. Fecal occult blood test negative. Patient has received 5 units of PRBC to date. Platelets improving. Now found to have E. Faecalis bacteremia. Per chart, hospice may be consulted.   Clinical Impression   Patient is currently requiring assistance with ADLs including Max to total assist with seated or bed level Lower body ADLs, Min to Min guard assist with seated Upper body ADLs,  as well as  moderate assist with bed mobility and Min-Mod As assist with functional transfers to toilet with use of RW.  Current level of function is below patient's typical baseline.  During this evaluation, patient was limited by generalized weakness, impaired activity tolerance, pain and fear and anxiety re: falling as well as mild cognitive impairments, all of which has the potential to impact patient's safety and independence during functional mobility, as well as performance for ADLs.  Patient lives with her sister, who is able to provide 24/7 supervision but no physical assistance due to medical problems.  Patient demonstrates fair rehab potential, and should benefit from continued skilled occupational therapy services while in acute care to maximize safety, independence and quality of life at home.  Continued occupational therapy services in a SNF setting prior to return home is recommended.  ?       Recommendations for follow up therapy are one component of a multi-disciplinary discharge planning process, led by the attending physician.  Recommendations may be  updated based on patient status, additional functional criteria and insurance authorization.   Follow Up Recommendations  Skilled nursing-short term rehab (<3 hours/day)    Assistance Recommended at Discharge Frequent or constant Supervision/Assistance  Patient can return home with the following A little help with walking and/or transfers;Two people to help with walking and/or transfers;A little help with bathing/dressing/bathroom    Functional Status Assessment  Patient has had a recent decline in their functional status and demonstrates the ability to make significant improvements in function in a reasonable and predictable amount of time.  Equipment Recommendations  BSC/3in1;Wheelchair (measurements OT);Tub/shower seat    Recommendations for Other Services       Precautions / Restrictions Precautions Precautions: Fall Restrictions Weight Bearing Restrictions: No      Mobility Bed Mobility Overal bed mobility: Needs Assistance Bed Mobility: Supine to Sit     Supine to sit: Mod assist, HOB elevated          Transfers  See ADLs                        Balance Overall balance assessment: Needs assistance Sitting-balance support: No upper extremity supported, Feet supported Sitting balance-Leahy Scale: Good     Standing balance support: Bilateral upper extremity supported, Reliant on assistive device for balance, During functional activity Standing balance-Leahy Scale: Poor                             ADL either performed or assessed with clinical judgement   ADL Overall ADL's : Needs assistance/impaired Eating/Feeding: Set up;Sitting Eating/Feeding Details (indicate cue type and reason): Pt with good sitting balance and requesting sitting EOB for lunch.  Pt required full setup with condiments opened for her due to hand weakness. Grooming: Dance movement psychotherapist;Wash/dry hands;Sitting;Set up;Supervision/safety   Upper Body Bathing: Set  up;Sitting;Supervision/ safety   Lower Body Bathing: Maximal assistance;Sitting/lateral leans;Bed level   Upper Body Dressing : Sitting;Set up;Minimal assistance   Lower Body Dressing: Sitting/lateral leans;Total assistance Lower Body Dressing Details (indicate cue type and reason): Total Assist to don socks. Pt able to reach and unvelco top two tiers of SCDs but not most distal one. Toilet Transfer: Minimal assistance;Moderate assistance Toilet Transfer Details (indicate cue type and reason): Pt stood from elevated EOB to RW with Min As-Mod As after 2 false starts which pt reported was due to fear of falling. Pt unable to follow cues for safe hand placement and repeatedly placed Bil hands on RW. Rocking cues to promote anterior weight shift.  Pt took ~4-5 latearl steps along EOB with RW and Min As. Pt declined pivoting to [facility] or recliner. Toileting- Clothing Manipulation and Hygiene: Total assistance Toileting - Clothing Manipulation Details (indicate cue type and reason): On pure wick. Would anticipate Max-Total Assist for peri hygiene at bed level.     Functional mobility during ADLs: Minimal assistance;Moderate assistance;Rolling walker (2 wheels)       Vision Baseline Vision/History: 0 No visual deficits Vision Assessment?: No apparent visual deficits     Perception Perception Perception: Within Functional Limits   Praxis Praxis Praxis: Impaired Praxis Impairment Details: Initiation;Motor planning    Pertinent Vitals/Pain Pain Assessment Pain Assessment: Faces Faces Pain Scale: Hurts little more Pain Location: RT shoulder when testing PROM to ~110 and RT bicep with MMT. Pain Descriptors / Indicators: Grimacing Pain Intervention(s): Limited activity within patient's tolerance, Monitored during session, Repositioned     Hand Dominance Right   Extremity/Trunk Assessment Upper Extremity Assessment Upper Extremity Assessment: RUE deficits/detail;LUE deficits/detail RUE  Deficits / Details: RT hand pitting edema. Pt able to raise UE to about 80-90 degrees with AROM. Tolerated PROM to ~110.  Shoulder ~2+/5, elbow: 2+/5, grip: 3/5 RUE: Shoulder pain with ROM RUE Sensation: WNL RUE Coordination: decreased fine motor LUE Deficits / Details: AROM shoulder to ~90 degrees and PROM to 120. MMT: Shoulder ~3-/5, elbow: 3-/46m grip: 3+/5 LUE Sensation: WNL LUE Coordination: decreased fine motor   Lower Extremity Assessment Lower Extremity Assessment: Defer to PT evaluation   Cervical / Trunk Assessment Cervical / Trunk Assessment: Normal   Communication Communication Communication: Expressive difficulties   Cognition Arousal/Alertness: Awake/alert Behavior During Therapy: Flat affect Overall Cognitive Status: Impaired/Different from baseline Area of Impairment: Orientation, Problem solving, Awareness                 Orientation Level: Disoriented to, Time (Day of week.)         Awareness: Emergent Problem Solving: Slow processing, Decreased initiation, Difficulty sequencing, Requires verbal cues       General Comments       Exercises     Shoulder Instructions      Home Living Family/patient expects to be discharged to:: Private residence Living Arrangements: Other (Comment) Available Help at Discharge: Friend(s);Available PRN/intermittently Type of Home: Apartment Home Access: Stairs to enter Entrance Stairs-Number of Steps: 4 no rails Entrance Stairs-Rails: None Home Layout: One level     Bathroom Shower/Tub: Tub/shower unit (Pt has been sponge bathing. Has not entered shower since ~Oct 2022 due to fear of falling.)   Bathroom Toilet: Standard     Home Equipment: Conservation officer, nature (2 wheels)  Prior Functioning/Environment Prior Level of Function : Needs assist             Mobility Comments: has been using RW since previous admission. States cannot stand at sink for grooming anymore. ADLs Comments: Pt reports that  she is unable to dress feet but wear well supported slide. Pt's sister sets things up for pt who often will sit EOB or in a chair and complete upper body ADLs.        OT Problem List: Decreased strength;Decreased coordination;Pain;Decreased range of motion;Decreased cognition;Increased edema;Decreased activity tolerance;Impaired balance (sitting and/or standing);Decreased knowledge of use of DME or AE;Impaired UE functional use      OT Treatment/Interventions: Self-care/ADL training;Therapeutic exercise;Therapeutic activities;Energy conservation;Cognitive remediation/compensation;DME and/or AE instruction;Patient/family education;Balance training    OT Goals(Current goals can be found in the care plan section) Acute Rehab OT Goals Patient Stated Goal: To be able to stand and take steps with decreased fear. OT Goal Formulation: With patient Time For Goal Achievement: 03/24/21 Potential to Achieve Goals: Fair ADL Goals Pt Will Perform Grooming: standing (at least 1 task) Pt Will Perform Lower Body Bathing: with adaptive equipment;with min assist;sit to/from stand;sitting/lateral leans Pt Will Perform Lower Body Dressing: sit to/from stand;with min assist;sitting/lateral leans;with adaptive equipment Pt Will Transfer to Toilet: ambulating;with supervision;bedside commode (BSC over toilet) Pt Will Perform Toileting - Clothing Manipulation and hygiene: with adaptive equipment;sitting/lateral leans;sit to/from stand;with min guard assist  OT Frequency: Min 2X/week    Co-evaluation              AM-PAC OT "6 Clicks" Daily Activity     Outcome Measure Help from another person eating meals?: A Little Help from another person taking care of personal grooming?: A Little Help from another person toileting, which includes using toliet, bedpan, or urinal?: Total Help from another person bathing (including washing, rinsing, drying)?: A Lot Help from another person to put on and taking off regular  upper body clothing?: A Little Help from another person to put on and taking off regular lower body clothing?: A Lot 6 Click Score: 14   End of Session Equipment Utilized During Treatment: Gait belt;Rolling walker (2 wheels);Oxygen Nurse Communication: Other (comment) (Pt request for salt with meal. CNA to check chart)  Activity Tolerance: Patient tolerated treatment well Patient left: in bed;with bed alarm set;with call bell/phone within reach (EOB)  OT Visit Diagnosis: Muscle weakness (generalized) (M62.81);Pain;Unsteadiness on feet (R26.81);Other symptoms and signs involving cognitive function Pain - Right/Left: Right Pain - part of body: Shoulder (as well as ABD pain)                Time: 9735-3299 OT Time Calculation (min): 34 min Charges:  OT General Charges $OT Visit: 1 Visit OT Evaluation $OT Eval Moderate Complexity: 1 Mod  Danielly Ackerley, OT Acute Rehab Services Office: [phone] 03/10/2021  Julien Girt 03/10/2021, 2:01 PM

## 2021-03-10 NOTE — TOC Progression Note (Addendum)
Transition of Care [facility]) - Progression Note    Patient Details  Name: Erin [name] MRN: [id number] Date of Birth: 06-Dec-1965  Transition of [facility]) CM/SW Contact  Purcell Mouton, RN Phone Number: 03/10/2021, 2:02 PM  Clinical Narrative:     Spoke with pt's niece Erin [name] concerning pt's discharge plans. Erin [name] states that pt could not go back home and asked if pt could be faxed to [facility] or [facility]. Because that she where Erin [name] live.  Agreed to fax to [place] [place]. Also explained to Erin [name] that Facilites have declined pt more related to insurance. Checked to see what facility was in network with [employer]. [facility] was the only one on the list. [facility] was called at [employer] and fax clinicals. There are no SNF in [place] that will take [employer]. SNF is asking if pt will continue on Chemo, if so SNF is unable to take pt.   Expected Discharge Plan: Home/Self Care Barriers to Discharge: No Barriers Identified  Expected Discharge Plan and Services Expected Discharge Plan: Home/Self Care       Living arrangements for the past 2 months: Single Family Home                                       Social Determinants of Health (SDOH) Interventions    Readmission Risk Interventions Readmission Risk Prevention Plan 01/06/2021  Transportation Screening Complete  Medication Review Press photographer) Complete  PCP or Specialist appointment within 3-5 days of discharge Complete  HRI or Home Care Consult Complete  SW Recovery Care/Counseling Consult Complete  Palliative Care Screening Not [place] Complete  Some recent data might be hidden

## 2021-03-11 ENCOUNTER — Inpatient Hospital Stay (HOSPITAL_COMMUNITY): Payer: 59

## 2021-03-11 DIAGNOSIS — D649 Anemia, unspecified: Secondary | ICD-10-CM | POA: Diagnosis not present

## 2021-03-11 DIAGNOSIS — R600 Localized edema: Secondary | ICD-10-CM

## 2021-03-11 LAB — COMPREHENSIVE METABOLIC PANEL
ALT: 10 U/L (ref 0–44)
AST: 16 U/L (ref 15–41)
Albumin: <1.5 g/dL — ABNORMAL LOW (ref 3.5–5.0)
Alkaline Phosphatase: 150 U/L — ABNORMAL HIGH (ref 38–126)
Anion gap: 4 — ABNORMAL LOW (ref 5–15)
BUN: 15 mg/dL (ref 6–20)
CO2: 22 mmol/L (ref 22–32)
Calcium: 8.3 mg/dL — ABNORMAL LOW (ref 8.9–10.3)
Chloride: 108 mmol/L (ref 98–111)
Creatinine, Ser: 0.71 mg/dL (ref 0.44–1.00)
GFR, Estimated: >60 mL/min (ref >60)
Glucose, Bld: 117 mg/dL — ABNORMAL HIGH (ref 70–99)
Potassium: 4 mmol/L (ref 3.5–5.1)
Sodium: 134 mmol/L — ABNORMAL LOW (ref 135–145)
Total Bilirubin: 0.6 mg/dL (ref 0.3–1.2)
Total Protein: 5.8 g/dL — ABNORMAL LOW (ref 6.5–8.1)

## 2021-03-11 LAB — GLUCOSE, CAPILLARY
Glucose-Capillary: 95 mg/dL (ref 70–99)
Glucose-Capillary: 125 mg/dL — ABNORMAL HIGH (ref 70–99)
Glucose-Capillary: 88 mg/dL (ref 70–99)

## 2021-03-11 LAB — CBC WITH DIFFERENTIAL/PLATELET
Abs Immature Granulocytes: 0.27 10*3/uL — ABNORMAL HIGH (ref 0.00–0.07)
Basophils Absolute: 0 10*3/uL (ref 0.0–0.1)
Basophils Relative: 0 %
Eosinophils Absolute: 0 10*3/uL (ref 0.0–0.5)
Eosinophils Relative: 0 %
HCT: 23.7 % — ABNORMAL LOW (ref 36.0–46.0)
Hemoglobin: 7.3 g/dL — ABNORMAL LOW (ref 12.0–15.0)
Immature Granulocytes: 2 %
Lymphocytes Relative: 8 %
Lymphs Abs: 1.2 10*3/uL (ref 0.7–4.0)
MCH: 30 pg (ref 26.0–34.0)
MCHC: 30.8 g/dL (ref 30.0–36.0)
MCV: 97.5 fL (ref 80.0–100.0)
Monocytes Absolute: 1 10*3/uL (ref 0.1–1.0)
Monocytes Relative: 6 %
Neutro Abs: 13.3 10*3/uL — ABNORMAL HIGH (ref 1.7–7.7)
Neutrophils Relative %: 84 %
Platelets: 135 10*3/uL — ABNORMAL LOW (ref 150–400)
RBC: 2.43 MIL/uL — ABNORMAL LOW (ref 3.87–5.11)
RDW: 17.2 % — ABNORMAL HIGH (ref 11.5–15.5)
WBC: 15.8 10*3/uL — ABNORMAL HIGH (ref 4.0–10.5)
nRBC: 0 % (ref 0.0–0.2)

## 2021-03-11 LAB — PHOSPHORUS: Phosphorus: 2.9 mg/dL (ref 2.5–4.6)

## 2021-03-11 LAB — BRAIN NATRIURETIC PEPTIDE: B Natriuretic Peptide: 208.3 pg/mL — ABNORMAL HIGH (ref 0.0–100.0)

## 2021-03-11 LAB — MAGNESIUM: Magnesium: 2 mg/dL (ref 1.7–2.4)

## 2021-03-11 NOTE — Progress Notes (Signed)
Note for some reason initially entered on 1/29 date, copied and pasted to 1/30 as this is correct date  PROGRESS NOTE       Erin [name]             YJE:[id number] DOB: 1965/06/30 DOA: 02/20/2021 PCP: Pcp, No      Chief Complaint  Patient presents with   Nausea      Brief Narrative:  [name] is Erin [name] 56 y.o. 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 Patient presented secondary to nausea/vomiting/diarrhea and abdominal pain. Hospitalization complicated by recurrent anemia. Fecal occult blood test negative. Patient has received 5 units of PRBC to date. Platelets improving. Now found to have E. Faecalis bacteremia.      Assessment & Plan:   Principal Problem:   Severe anemia Active Problems:   Bacteremia due to Enterococcus   Adenocarcinoma (HCC)   Metastatic adenocarcinoma of unknown origin (HCC)   Severe sepsis (HCC)   Thrombocytopenia (HCC)   Acute metabolic encephalopathy   Hypokalemia   Essential hypertension   Leukocytosis   Protein-calorie malnutrition, severe   Cancer related pain   Diabetes mellitus (HCC)   OSA (obstructive sleep apnea)   Obesity hypoventilation syndrome (HCC)   Assessment and Plan: * Severe anemia- (present on admission) Secondary to chemotherapy.  Continues to fluctuate, trend Patient has received 6 units of PRBC to date.   Bacteremia due to Enterococcus Infectious disease consulted. Likely GI source. Concern for possible endocarditis. Transthoracic Echocardiogram without evidence of vegetation. Repeat blood cultures (1/22) are no growth to date -ID recommendations: Ampicillin IV x 4 weeks.  Consider rechecking TTE and repeat blood cultures based on performance status and her candidacy for chemotherapy in the future.  Needs TEE and repeat cultures to assure clearance prior to restarting chemo to ensure port  is not infected and no concerns for endocarditis.  Follow up with ID clinic if improvement in functional status. 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 Patient follows with Dr. Benay Spice and is receiving chemotherapy. Recent imaging significant for progression of disease. Palliative care consulted (1/14) and decision made at that time was for full code/full scop treatment. Currently awaiting SNF - no offers, has been declined - SW/CM continuing to work on this Prior to this admission had been living with sister if performance status and nutritional status don't improve, she won't be candidate for chemo -would need to consider hospice.   Severe sepsis [facility]) Not present on admission. Unsure of source. Port site does not appear infected. Associated leukocytosis, fevers, hypotension. Blood cultures (1/21) positive for GPCs with BCID suggesting enterococcus faecalis. Started on ampicillin IV. -See problem, Bacteremia due to Enterococcus   Acute metabolic encephalopathy Oxycodone reduced. Possibly some effect from underlying bacteremia. Somnolence is improved. Encephalopathy improved - follow encephalopathy with pain meds - was ok today, seemed improved MRI 1/23 with motion artifact, no acute intracranial process EEG with moderate diffuse encephalopathy, no seizures or epileptiform discharges   Thrombocytopenia ([place]) Related to chemotherapy. resolved.   Hypokalemia- (present on admission) Potassium supplementation as needed   Essential hypertension- (present on admission) Patient is on hydrochlorothiazide, Coreg and amlodipine as an outpatient. Coreg and amlodipine continued. Blood pressure mostly well controlled but with hypotension in setting of worsened anemia  and sepsis. -Continue Coreg -  hold HCTZ and amlodipine   Leukocytosis Likely secondary to bacteremia/infection.  WBC still elevated, seems to be gradually improving, relatively stable today   Protein-calorie malnutrition, severe Patient continues to have poor oral intake which may affect her candidacy for future treatment of cancer.  Dietitian recommendations: Continue Boost Breeze TID and Prosource Plus BID. Continue to provide encouragement with eating and drinking as able.    Cancer related pain- (present on admission) Continue Oxycodone prn   Diabetes mellitus ([place]) Hemoglobin A1C of 7.1 Continue SSI   Hypotension-resolved as of 03/03/2021 Likely related to combination of anemia, sepsis and iatrogenic from antihypertensives.  Given Cord Wilczynski 500 mL NS bolus. Resolved.     DVT prophylaxis: SCD Code Status: full Family Communication: none Disposition:    Status is: Inpatient   Remains inpatient appropriate because: continued IV abx, need for intermittent tranfusion           Consultants:  Oncology Palliative care   Procedures:  none   Antimicrobials:  Anti-infectives (From admission, onward)        Start     Dose/Rate Route Frequency Ordered Stop    03/02/21 0800   ampicillin (OMNIPEN) 2 g in sodium chloride 0.9 % 100 mL IVPB  Status:  Discontinued        2 g 300 mL/hr over 20 Minutes Intravenous Every 4 hours 03/02/21 0619 03/02/21 0643    03/02/21 0730   ampicillin (OMNIPEN) 2 g in sodium chloride 0.9 % 100 mL IVPB        2 g 300 mL/hr over 20 Minutes Intravenous Every 4 hours 03/02/21 0643               Subjective: No new complaints Was living with sister before this hospital stay, not sure if she'll be able to manage her alone    Objective:       Vitals:    03/09/21 2012 03/10/21 0532 03/10/21 1340 03/10/21 1351  BP: 102/67 108/67   107/75  Pulse: 92 95 95 97  Resp: 20 20   16   Temp: 99.2 F (37.3 C) 98.2 F (36.8 C)   (!) 97.5 F (36.4 C)  TempSrc:   Oral   Oral  SpO2: 100%  97%   95%  Weight:          Height:              Intake/Output Summary (Last 24 hours) at 03/10/2021 1921 Last data filed at 03/10/2021 0532    Gross per 24 hour  Intake --  Output 600 ml  Net -600 ml         Filed Weights    03/07/21 0500 03/08/21 0500 03/09/21 0500  Weight: 111.5 kg 111.9 kg 111.6 kg      Examination:   General: No acute distress. Cardiovascular: RRR Lungs: unlabored Abdomen: Soft, nontender, nondistended  Neurological: Alert and oriented 3. Moves all extremities 4. Cranial nerves II through XII grossly intact. Skin: Warm and dry. No rashes or lesions. Extremities: No clubbing or cyanosis. No edema.    Data Reviewed: I have personally reviewed following labs and imaging studies   CBC: Last Labs            Recent Labs  Lab 03/06/21 0239 03/07/21 0507 03/08/21 0425 03/09/21 0145 03/09/21 1707 03/10/21 0321 03/10/21 1617  WBC 20.7* 18.5* 17.4* 14.1*  --  14.2*  --   NEUTROABS 18.0* 15.9* 14.9* 12.0*  --  11.6*  --  HGB 7.2* 7.7* 7.9* 7.2* 7.3* 7.1* 7.8*  HCT 23.2* 25.1* 25.3* 22.8* 23.6* 23.7* 24.9*  MCV 96.3 94.7 95.5 96.6  --  97.5  --   PLT 140* 149* 159 176  --  149*  --         Basic Metabolic Panel: Last Labs          Recent Labs  Lab 03/06/21 0239 03/07/21 0507 03/08/21 0425 03/09/21 0145 03/10/21 0321  NA 135 134* 134* 133* 133*  K 4.0 4.1 4.1 4.0 4.1  CL 106 105 106 105 105  CO2 22 21* 20* 23 23  GLUCOSE 90 100* 89 97 103*  BUN 25* 22* 17 15 15   CREATININE 0.70 0.77 0.72 0.69 0.69  CALCIUM 8.3* 8.4* 8.5* 8.5* 8.5*  MG 2.2 2.0 1.9 1.9 2.0  PHOS 3.1 3.2 2.7 2.7 2.9        GFR: Estimated Creatinine Clearance: 97.2 mL/min (by C-G formula based on SCr of 0.69 mg/dL).   Liver Function Tests: Last Labs          Recent Labs  Lab 03/06/21 0239 03/07/21 0507 03/08/21 0425 03/09/21 0145 03/10/21 0321  AST 19 18 18 17 18   ALT 9 10 10 8 9   ALKPHOS 168* 158* 169* 167* 159*  BILITOT 1.2 1.6* 1.6* 1.3* 1.0  PROT  5.8* 5.9* 5.9* 5.7* 5.6*  ALBUMIN <1.5* <1.5* <1.5* <1.5* <1.5*        CBG: Last Labs          Recent Labs  Lab 03/09/21 1655 03/09/21 2014 03/10/21 0753 03/10/21 1202 03/10/21 1654  GLUCAP 115* 94 90 89 101*                 Recent Results (from the past 240 hour(s))  Culture, blood (routine x 2)     Status: Abnormal    Collection Time: 03/01/21 11:03 AM    Specimen: BLOOD  Result Value Ref Range Status    Specimen Description     Final      BLOOD RIGHT ANTECUBITAL Performed at [facility], [place] [address] [place], [place] [address]      Special Requests     Final      BOTTLES DRAWN AEROBIC AND ANAEROBIC Blood Culture adequate volume Performed at [place] [address], [place], [place] [address]      Culture  Setup Time     Final      GRAM POSITIVE COCCI IN BOTH AEROBIC AND ANAEROBIC BOTTLES CRITICAL RESULT CALLED TO, READ BACK BY AND VERIFIED WITH: M LILLISTON,[email or web address]@[id number]  03/02/21 [place] Performed at [facility], [place] [address], [place], [place] [address]      Culture ENTEROCOCCUS FAECALIS (Charlet Harr)   Final    Report Status 03/04/2021 FINAL   Final    Organism ID, Bacteria ENTEROCOCCUS FAECALIS   Final      Susceptibility    Enterococcus faecalis - MIC*      AMPICILLIN <=2 SENSITIVE Sensitive        VANCOMYCIN 1 SENSITIVE Sensitive        GENTAMICIN SYNERGY SENSITIVE Sensitive       * ENTEROCOCCUS FAECALIS  Culture, blood (routine x 2)     Status: Abnormal    Collection Time: 03/01/21 11:03 AM    Specimen: BLOOD  Result Value Ref Range Status    Specimen Description     Final      BLOOD BLOOD LEFT HAND Performed at  [facility], [place] [address], [place], [place] [address]      Special Requests     Final      BOTTLES DRAWN AEROBIC AND ANAEROBIC Blood Culture adequate volume Performed at [place] [address], [place], [place] [address]      Culture  Setup Time     Final       GRAM POSITIVE COCCI IN BOTH AEROBIC AND ANAEROBIC BOTTLES CRITICAL VALUE NOTED.  VALUE IS CONSISTENT WITH PREVIOUSLY REPORTED AND CALLED VALUE.      Culture (Raylan Hanton)   Final      ENTEROCOCCUS FAECALIS SUSCEPTIBILITIES PERFORMED ON PREVIOUS CULTURE WITHIN THE LAST 5 DAYS. Performed at [facility], [place] [address] [place], [place] [address]      Report Status 03/04/2021 FINAL   Final  Blood Culture ID Panel (Reflexed)     Status: Abnormal    Collection Time: 03/01/21 11:03 AM  Result Value Ref Range Status    Enterococcus faecalis DETECTED (Bryttani Blew) NOT DETECTED Final      Comment: CRITICAL RESULT CALLED TO, READ BACK BY AND VERIFIED WITH: M LILLISTON,[email or web address]@[id number]  03/02/21 [place]      Enterococcus Faecium NOT DETECTED NOT DETECTED Final    Listeria monocytogenes NOT DETECTED NOT DETECTED Final    Staphylococcus species NOT DETECTED NOT DETECTED Final    Staphylococcus aureus (BCID) NOT DETECTED NOT DETECTED Final    Staphylococcus epidermidis NOT DETECTED NOT DETECTED Final    Staphylococcus lugdunensis NOT DETECTED NOT DETECTED Final    Streptococcus species NOT DETECTED NOT DETECTED Final    Streptococcus agalactiae NOT DETECTED NOT DETECTED Final    Streptococcus pneumoniae NOT DETECTED NOT DETECTED Final    Streptococcus pyogenes NOT DETECTED NOT DETECTED Final    Elija Mccamish.calcoaceticus-baumannii NOT DETECTED NOT DETECTED Final    Bacteroides fragilis NOT DETECTED NOT DETECTED Final    Enterobacterales NOT DETECTED NOT DETECTED Final    Enterobacter cloacae complex NOT DETECTED NOT DETECTED Final    Escherichia coli NOT DETECTED NOT DETECTED Final    Klebsiella aerogenes NOT DETECTED NOT DETECTED Final    Klebsiella oxytoca NOT DETECTED NOT DETECTED Final    Klebsiella pneumoniae NOT DETECTED NOT DETECTED Final    Proteus species NOT DETECTED NOT DETECTED Final    Salmonella species NOT DETECTED NOT DETECTED Final    Serratia marcescens NOT DETECTED NOT DETECTED Final    Haemophilus  influenzae NOT DETECTED NOT DETECTED Final    Neisseria meningitidis NOT DETECTED NOT DETECTED Final    Pseudomonas aeruginosa NOT DETECTED NOT DETECTED Final    Stenotrophomonas maltophilia NOT DETECTED NOT DETECTED Final    Candida albicans NOT DETECTED NOT DETECTED Final    Candida auris NOT DETECTED NOT DETECTED Final    Candida glabrata NOT DETECTED NOT DETECTED Final    Candida krusei NOT DETECTED NOT DETECTED Final    Candida parapsilosis NOT DETECTED NOT DETECTED Final    Candida tropicalis NOT DETECTED NOT DETECTED Final    Cryptococcus neoformans/gattii NOT DETECTED NOT DETECTED Final    Vancomycin resistance NOT DETECTED NOT DETECTED Final      Comment: Performed at [facility], [address] [place], [place] [address]  Culture, blood (routine x 2)     Status: None    Collection Time: 03/02/21  4:18 PM    Specimen: BLOOD  Result Value Ref Range Status    Specimen Description     Final      BLOOD  LEFT ANTECUBITAL Performed at [place] [address] [place], [place] [address]      Special Requests     Final      BOTTLES DRAWN AEROBIC ONLY Blood Culture adequate volume Performed at [place] [address], [place], [place] [address]      Culture     Final      NO GROWTH 5 DAYS Performed at [facility], [place] [address] [place], [place] [address]      Report Status 03/07/2021 FINAL   Final  Culture, blood (routine x 2)     Status: None    Collection Time: 03/02/21  4:18 PM    Specimen: BLOOD  Result Value Ref Range Status    Specimen Description     Final      BLOOD LEFT ANTECUBITAL Performed at [place] [address] [place], [place] [address]      Special Requests     Final      BOTTLES DRAWN AEROBIC AND ANAEROBIC Blood Culture adequate volume Performed at [place] [address] [place], [place] [address]      Culture     Final      NO GROWTH 5  DAYS Performed at [facility], [place] [address], [place], [place] [address]      Report Status 03/07/2021 FINAL   Final  Culture, blood (routine x 2)     Status: None (Preliminary result)    Collection Time: 03/07/21 10:52 AM    Specimen: Left Antecubital; Blood  Result Value Ref Range Status    Specimen Description     Final      LEFT ANTECUBITAL Performed at [place] [address], [place], [place] [address]      Special Requests     Final      BOTTLES DRAWN AEROBIC AND ANAEROBIC Blood Culture adequate volume Performed at [place] [address], [place], [place] [address]      Culture     Final      NO GROWTH 3 DAYS Performed at [facility], [place] [address], [place], [place] [address]      Report Status PENDING   Incomplete  Culture, blood (routine x 2)     Status: None (Preliminary result)    Collection Time: 03/07/21 10:52 AM    Specimen: Left Antecubital; Blood  Result Value Ref Range Status    Specimen Description     Final      LEFT ANTECUBITAL Performed at [place] [address], [place], [place] [address]      Special Requests     Final      BOTTLES DRAWN AEROBIC AND ANAEROBIC Blood Culture adequate volume Performed at [place] [address] [place] [address] [place], [place] [address]      Culture     Final      NO GROWTH 3 DAYS Performed at [facility], [place] [address], [place],  [address]      Report Status PENDING   Incomplete              Radiology Studies: Imaging Results (Last 48 hours)  No results found.             Scheduled Meds:  sodium chloride   Intravenous Once   carvedilol  6.25 mg Oral BID WC   Chlorhexidine Gluconate  Cloth  6 each Topical Daily   [START ON 03/11/2021] feeding supplement  1 Container Oral Q24H   feeding supplement  237 mL Oral Q24H   mouth rinse  15 mL Mouth Rinse BID   multivitamin with minerals  1 tablet Oral  Daily   pantoprazole  40 mg Oral Daily    Continuous Infusions:  ampicillin (OMNIPEN) IV 2 g (03/10/21 1655)       LOS: 18 days      Time spent: over 30 min       Fayrene Helper, MD [facility]     To contact the attending provider between 7A-7P or the covering provider during after hours 7P-7A, please log into the web site [email or web address] and access using universal [place] password for that web site. If you do not have the password, please call the hospital operator.   03/10/2021, 7:21 PM

## 2021-03-11 NOTE — Progress Notes (Signed)
PROGRESS NOTE    Erin [name]  YTK:[id number] DOB: 1965-03-02 DOA: 02/20/2021 PCP: Pcp, No  Chief Complaint  Patient presents with   Nausea    Brief Narrative:  Erin [name] is Erin [name] 56 y.o. 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 Patient presented secondary to nausea/vomiting/diarrhea and abdominal pain. Hospitalization complicated by recurrent anemia. Fecal occult blood test negative. Patient has received 6 units of PRBC to date. Platelets improving. Now found to have E. Faecalis bacteremia.  On ampicillin per infectious disease.  At this time, working on placement, she's been difficult to place up until this point.  Oncology continues to follow, future decisions regarding her cancer treatment will be based on her performance status and nutritional status.  If these do not improve, oncology will likely recommend hospice.    See below for additional details    Assessment & Plan:   Principal Problem:   Bacteremia due to Enterococcus Active Problems:   Adenocarcinoma (Erin [name])   Metastatic adenocarcinoma of unknown origin (Erin [name])   Severe anemia   Severe sepsis (HCC)   Lower extremity edema   Thrombocytopenia (HCC)   Acute metabolic encephalopathy   Hypokalemia   Essential hypertension   Leukocytosis   Protein-calorie malnutrition, severe   Cancer related pain   Diabetes mellitus ([place])   OSA (obstructive sleep apnea)   Obesity hypoventilation syndrome (HCC)   Assessment and Plan: * Bacteremia due to Enterococcus S/p Infectious disease consulted. Likely GI source. Concern for possible endocarditis. Transthoracic Echocardiogram without evidence of vegetation. Repeat blood cultures (1/22) are no growth to date ID recommendations (1/24 sign off note and OPAT orders 1/25): Ampicillin IV x 4 weeks.  Consider rechecking TTE and repeat blood cultures  based on performance status and her candidacy for chemotherapy in the future.  Needs TEE and repeat cultures to assure clearance prior to restarting chemo to ensure Erin is not infected and no concerns for endocarditis.  Follow up with ID clinic if improvement in functional status. Fever 1/26, follow repeat cultures NGx4 - no recurrent fevers - follow  Severe anemia- (present on admission) Secondary to chemotherapy.  Continues to fluctuate, trend Patient has received 6 units of PRBC to date.  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 Patient follows with Erin [name] and is receiving chemotherapy. Recent imaging significant for progression of disease. Palliative care consulted (1/14) and decision made at that time was for full code/full scop treatment. Currently awaiting SNF - no offers, has been declined - SW/CM continuing to work on this Prior to this admission had been living with sister if performance status and nutritional status don't improve, she won't be candidate for chemo -would need to consider hospice. Complained of Erin tenderness/pain today to Erin [name], will continue to monitor, may need to remove if continues  Lower extremity edema Follow BNP, CXR, LE [place]  Severe sepsis Erin [name]) Not present on admission. Unsure of source. Erin site does not appear infected. Associated leukocytosis, fevers, hypotension. Blood cultures (1/21) positive for GPCs with BCID suggesting enterococcus faecalis. Started on ampicillin IV. -See problem, Bacteremia due to Enterococcus  Acute metabolic encephalopathy Oxycodone reduced. Possibly some effect from underlying bacteremia. Somnolence is improved. Encephalopathy improved - follow encephalopathy with pain meds - was ok today, seemed improved MRI 1/23 with motion artifact, no acute intracranial process EEG with moderate diffuse  encephalopathy, no seizures or epileptiform discharges  Thrombocytopenia (HCC) Related to chemotherapy. resolved.  Hypokalemia- (present on admission) Potassium supplementation as needed  Essential hypertension- (present on admission) Patient is on hydrochlorothiazide, Coreg and amlodipine as an outpatient. Coreg and amlodipine continued. Blood pressure mostly well controlled but with hypotension in setting of worsened anemia and sepsis. -Continue Coreg - hold HCTZ and amlodipine  Leukocytosis Likely secondary to bacteremia/infection.  WBC still elevated, seems to be gradually improving, relatively stable today  Protein-calorie malnutrition, severe Patient continues to have poor oral intake which may affect her candidacy for future treatment of cancer.  Dietitian recommendations: Continue Boost Breeze TID and Prosource Plus BID. Continue to provide encouragement with eating and drinking as able.   Cancer related pain- (present on admission) Continue Oxycodone prn  Diabetes mellitus ([place]) Hemoglobin A1C of 7.1 Continue SSI  Hypotension-resolved as of 03/03/2021 Likely related to combination of anemia, sepsis and iatrogenic from antihypertensives.  Given Erin [name] 500 mL NS bolus. Resolved.   DVT prophylaxis: SCD's given anemia requiring transfusion Code Status: full Family Communication: none Disposition:   Status is: Inpatient Remains inpatient appropriate because: awaiting safe discharge, need for IV abx  Planned Discharge Destination: Skilled nursing facility         Consultants:  Oncology Palliative care  Procedures:  none  Antimicrobials:  Anti-infectives (From admission, onward)    Start     Dose/Rate Route Frequency Ordered Stop   03/02/21 0800  ampicillin (OMNIPEN) 2 g in sodium chloride 0.9 % 100 mL IVPB  Status:  Discontinued        2 g 300 mL/hr over 20 Minutes Intravenous Every 4 hours 03/02/21 0619 03/02/21 0643   03/02/21 0730  ampicillin (OMNIPEN) 2  g in sodium chloride 0.9 % 100 mL IVPB        2 g 300 mL/hr over 20 Minutes Intravenous Every 4 hours 03/02/21 0643 03/29/21 2359       Subjective: No new complaints  Objective: Vitals:   03/10/21 2016 03/11/21 0623 03/11/21 1030 03/11/21 1401  BP: 121/89 101/73 115/74 118/70  Pulse: 94 89 91 86  Resp: 18 16 20 16   Temp: 97.8 F (36.6 C) 97.6 F (36.4 C) 97.6 F (36.4 C) 98.5 F (36.9 C)  TempSrc: Oral Oral Oral Oral  SpO2: 100% 100% 100% 100%  Weight:  120 kg    Height:        Intake/Output Summary (Last 24 hours) at 03/11/2021 1954 Last data filed at 03/11/2021 1200 Gross per 24 hour  Intake 520 ml  Output 852 ml  Net -332 ml   Filed Weights   03/08/21 0500 03/09/21 0500 03/11/21 5361  Weight: 111.9 kg 111.6 kg 120 kg    Examination:  General exam: Appears calm and comfortable  Respiratory system: unlabored Cardiovascular system:RRR Gastrointestinal system: mildly diffusely tender Central nervous system: Alert and oriented. No focal neurological deficits. Extremities: R>L LE edema Skin: No rashes, lesions or ulcers Psychiatry: Judgement and insight appear normal. Mood & affect appropriate.     Data Reviewed: I have personally reviewed following labs and imaging studies  CBC: Recent Labs  Lab 03/07/21 0507 03/08/21 0425 03/09/21 0145 03/09/21 1707 03/10/21 0321 03/10/21 1617 03/11/21 0349  WBC 18.5* 17.4* 14.1*  --  14.2*  --  15.8*  NEUTROABS 15.9* 14.9* 12.0*  --  11.6*  --  13.3*  HGB 7.7* 7.9* 7.2* 7.3* 7.1* 7.8* 7.3*  HCT 25.1* 25.3* 22.8* 23.6* 23.7* 24.9* 23.7*  MCV 94.7 95.5  96.6  --  97.5  --  97.5  PLT 149* 159 176  --  149*  --  135*    Basic Metabolic Panel: Recent Labs  Lab 03/07/21 0507 03/08/21 0425 03/09/21 0145 03/10/21 0321 03/11/21 0349  NA 134* 134* 133* 133* 134*  K 4.1 4.1 4.0 4.1 4.0  CL 105 106 105 105 108  CO2 21* 20* 23 23 22   GLUCOSE 100* 89 97 103* 117*  BUN 22* 17 15 15 15   CREATININE 0.77 0.72 0.69 0.69  0.71  CALCIUM 8.4* 8.5* 8.5* 8.5* 8.3*  MG 2.0 1.9 1.9 2.0 2.0  PHOS 3.2 2.7 2.7 2.9 2.9    GFR: Estimated Creatinine Clearance: 101.4 mL/min (by C-G formula based on SCr of 0.71 mg/dL).  Liver Function Tests: Recent Labs  Lab 03/07/21 0507 03/08/21 0425 03/09/21 0145 03/10/21 0321 03/11/21 0349  AST 18 18 17 18 16   ALT 10 10 8 9 10   ALKPHOS 158* 169* 167* 159* 150*  BILITOT 1.6* 1.6* 1.3* 1.0 0.6  PROT 5.9* 5.9* 5.7* 5.6* 5.8*  ALBUMIN <1.5* <1.5* <1.5* <1.5* <1.5*    CBG: Recent Labs  Lab 03/10/21 1202 03/10/21 1654 03/10/21 2035 03/11/21 0743 03/11/21 1114  GLUCAP 89 101* 91 95 125*     Recent Results (from the past 240 hour(s))  Culture, blood (routine x 2)     Status: None   Collection Time: 03/02/21  4:18 PM   Specimen: BLOOD  Result Value Ref Range Status   Specimen Description   Final    BLOOD LEFT ANTECUBITAL Performed at [facility] [place] [address], [place], [place] [address]    Special Requests   Final    BOTTLES DRAWN AEROBIC ONLY Blood Culture adequate volume Performed at [facility] [address], [place], [place] [address]    Culture   Final    NO GROWTH 5 DAYS Performed at [facility], [place] [address], [place], [place] [address]    Report Status 03/07/2021 FINAL  Final  Culture, blood (routine x 2)     Status: None   Collection Time: 03/02/21  4:18 PM   Specimen: BLOOD  Result Value Ref Range Status   Specimen Description   Final    BLOOD LEFT ANTECUBITAL Performed at [facility] [address] [place], [place] [address]    Special Requests   Final    BOTTLES DRAWN AEROBIC AND ANAEROBIC Blood Culture adequate volume Performed at [facility] [address] [place], [place] [address]    Culture   Final    NO GROWTH 5 DAYS Performed at [facility], [place] [address], [place], [place] [address]    Report Status 03/07/2021 FINAL  Final   Culture, blood (routine x 2)     Status: None (Preliminary result)   Collection Time: 03/07/21 10:52 AM   Specimen: Left Antecubital; Blood  Result Value Ref Range Status   Specimen Description   Final    LEFT ANTECUBITAL Performed at [facility] [address], [place], [place] [address]    Special Requests   Final    BOTTLES DRAWN AEROBIC AND ANAEROBIC Blood Culture adequate volume Performed at [facility] [address] [place], [place] [address]    Culture   Final    NO GROWTH 4 DAYS Performed at [facility], [place] [address] [place], [place] [address]    Report Status  PENDING  Incomplete  Culture, blood (routine x 2)     Status: None (Preliminary result)   Collection Time: 03/07/21 10:52 AM   Specimen: Left Antecubital; Blood  Result Value Ref Range Status   Specimen Description   Final    LEFT ANTECUBITAL Performed at [facility] [address] [place], [place] [address]    Special Requests   Final    BOTTLES DRAWN AEROBIC AND ANAEROBIC Blood Culture adequate volume Performed at [facility] [address] [place], [place] [address]    Culture   Final    NO GROWTH 4 DAYS Performed at [facility], [place] [address], [place], [place] [address]    Report Status PENDING  Incomplete         Radiology Studies: No results found.      Scheduled Meds:  sodium chloride   Intravenous Once   carvedilol  6.25 mg Oral BID WC   Chlorhexidine Gluconate Cloth  6 each Topical Daily   feeding supplement  1 Container Oral Q24H   feeding supplement  237 mL Oral Q24H   mouth rinse  15 mL Mouth Rinse BID   multivitamin with minerals  1 tablet Oral Daily   pantoprazole  40 mg Oral Daily   Continuous Infusions:  ampicillin (OMNIPEN) IV 2 g (03/11/21 1948)     LOS: 19 days    Time spent: over 66 min    Fayrene Helper, MD [facility]   To contact the attending  provider between 7A-7P or the covering provider during after hours 7P-7A, please log into the web site [email or web address] and access using universal [place] password for that web site. If you do not have the password, please call the hospital operator.  03/11/2021, 7:54 PM

## 2021-03-11 NOTE — Assessment & Plan Note (Signed)
Follow BNP, CXR, LE [place]

## 2021-03-11 NOTE — Progress Notes (Signed)
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 -Bladder neck, urethra, cervical biopsies 12/17/2020 -MSS, tumor mutation burden 4, K-ras amplification, PD-L1 tumor proportion score-0 -[facility] -90% intestinal malignancy (colorectal adenocarcinoma 85%) small intestine adenocarcinoma less than 5%, gastroesophageal adenocarcinoma not excluded -12/05/2020 MRI of the pelvis-poorly marginated enhancing 3.7 x 2.8 x 3.0 cm mass centered at the urethra, solid avidly enhancing bilateral ovarian masses, poorly marginated enhancing 2.4 x 2.3 x 3.0 cm mass in the left uterine cervix, mild to moderate bilateral common iliac, bilateral external iliac, and bilateral inguinal lymphadenopathy, diffuse patchy confluent nodular replacement of the pelvic osseous structures. -12/13/2020 CEA 1864, CA125 29.2 -12/16/2020 CT abdomen/pelvis-rapidly increasing size of the left ovary with some adjacent ascites, enlarging masses elsewhere, masslike area of the cervix and potentially within a urethral diverticulum, well-circumscribed left adrenal lesion measuring 3.2 x 2.6 cm, heterogeneous pattern of subtle sclerosis and lucency in the spine. -12/16/2020 MRI of the lumbar spine-diffusely abnormal appearance of the bone marrow throughout the visualized lumbar spine and pelvis highly suspicious for diffuse osseous metastatic disease, retroperitoneal/iliac adenopathy with left larger than right adnexal masses. -12/17/2020 CT chest-no acute intrathoracic pathology -Upper endoscopy 12/22/2020-gastritis, gastric nodule biopsy-adenocarcinoma, poorly differentiated with signet ring morphology -Colonoscopy 12/22/2020-polyps removed from the ascending and sigmoid colon, extrinsic compression of the sigmoid colon-tubulovillous adenoma without high-grade dysplasia, tubular adenoma and hyperplastic polyps -12/27/2020 cycle #1 FOLFOX -01/10/2021  cycle #2 FOLFOX -01/27/2021 cycle 3 FOLFOX, oxaliplatin dose reduced -02/20/2021 CT abdomen/pelvis without contrast-increased size of the bilateral ovarian masses, interval diffuse bony metastatic disease, progressive metastatic bilateral pelvic and inguinal lymphadenopathy, increased size of the left adrenal metastasis, interval nodular densities at the lung bases suspicious for metastases, interval mild right hydronephrosis and hydroureter -02/22/2021 CEA 440 2.  Abdominal pain secondary #1 3.  Constipation 4.  Normocytic anemia 5.  Leukocytosis 6.  Protein calorie malnutrition 7.  Obstructive sleep apnea 8.  Diabetes mellitus 9.  Hypertension 10.  Possible pulmonary hypertension noted on CT chest 11.  Hospital admission 02/20/2021-intractable nausea and vomiting 12.  Right hydronephrosis and hydroureter 13.  Thrombocytopenia 14.  Low-grade fever 15.  E faecalis bacteremia  Ms. Seefeld appears unchanged.  She complains of pain at the Port-A-Cath site when she coughs.  There is tenderness at the Port-A-Cath site today.  No erythema.  She is afebrile and repeat blood cultures remain negative.  We will need to consider Port-A-Cath removal if the tenderness persists. She is waiting on skilled nursing facility placement.  Her performance status remains poor.  Her performance status remains poor Recommendations: 1.  Continue supportive care and antibiotics per ID/primary team.   2.  Consider Port-A-Cath removal for persistent tenderness/pain surrounding the right chest Port-A-Cath site 3.  Please call oncology as needed 03/12/2021.  I will check on her 03/13/2021. 4.  Discontinue CBG checks    SUBJECTIVE: Remains mildly confused this morning.  She does not want to be transferred to a nursing facility in [place].  Oncology History  Gastric cancer ([place])  12/27/2020 Initial Diagnosis   Gastric cancer ([place])   12/27/2020 -  Chemotherapy   Patient is on Treatment Plan : GASTRIC FOLFOX q14d x 12  cycles      PHYSICAL EXAMINATION:  Vitals:   03/11/21 1030 03/11/21 1401  BP: 115/74 118/70  Pulse: 91 86  Resp: 20 16  Temp: 97.6 F (36.4 C) 98.5 F (36.9 C)  SpO2: 100% 100%   Filed Weights   03/08/21  0500 03/09/21 0500 03/11/21 0623  Weight: 246 lb 11.1 oz (111.9 kg) 246 lb 0.5 oz (111.6 kg) 264 lb 8.8 oz (120 kg)    Intake/Output from previous day: 01/30 0701 - 01/31 0700 In: 300 [IV Piggyback:300] Out: 700 [Urine:700] HEENT: No thrush or bleeding Abdomen: Soft, tender in the left lower abdomen Vascular: Trace pitting edema to lower leg and foot bilaterally Skin: Petechial rash-improved  Port-A-Cath site without erythema, tender at chest wall inferior and to the left of the Port-A-Cath.  No fluctuance.  LABORATORY DATA:  I have reviewed the data as listed CMP Latest Ref Rng & Units 03/11/2021 03/10/2021 03/09/2021  Glucose 70 - 99 mg/dL 117(H) 103(H) 97  BUN 6 - 20 mg/dL _0 Creatinine 0.44 - 1.00 mg/dL 0.71 0.69 0.69  Sodium 135 - 145 mmol/L 134(L) 133(L) 133(L)  Potassium 3.5 - 5.1 mmol/L 4.0 4.1 4.0  Chloride 98 - 111 mmol/L 108 105 105  CO2 22 - 32 mmol/L _1 Calcium 8.9 - 10.3 mg/dL 8.3(L) 8.5(L) 8.5(L)  Total Protein 6.5 - 8.1 g/dL 5.8(L) 5.6(L) 5.7(L)  Total Bilirubin 0.3 - 1.2 mg/dL 0.6 1.0 1.3(H)  Alkaline Phos 38 - 126 U/L 150(H) 159(H) 167(H)  AST 15 - 41 U/L _2 ALT 0 - 44 U/L _3 Lab Results  Component Value Date   WBC 15.8 (H) 03/11/2021   HGB 7.3 (L) 03/11/2021   HCT 23.7 (L) 03/11/2021   MCV 97.5 03/11/2021   PLT 135 (L) 03/11/2021   NEUTROABS 13.3 (H) 03/11/2021    Lab Results  Component Value Date   CEA1 440.0 (H) 02/22/2021   CEA 617.97 (H) 02/11/2021    CT Abdomen Pelvis Wo Contrast  Result Date: 02/20/2021 CLINICAL DATA:  Acute, non localized abdominal pain. Nausea, vomiting, diarrhea and fatigue for the past week. Receiving chemotherapy for gastric and ovarian cancer. EXAM: CT ABDOMEN AND PELVIS WITHOUT  CONTRAST TECHNIQUE: Multidetector CT imaging of the abdomen and pelvis was performed following the standard protocol without IV contrast. RADIATION DOSE REDUCTION: This exam was performed according to the departmental dose-optimization program which includes automated exposure control, adjustment of the mA and/or kV according to patient size and/or use of iterative reconstruction technique. COMPARISON:  Chest CT dated 12/17/2020. Abdomen and pelvis CT dated 12/16/2020. FINDINGS: Lower chest: Mildly enlarged heart. Small amount of residual atelectasis/scarring in the right lower lobe, including a somewhat oval wedge-shaped area at the posterior right lung base which is significantly smaller. Interval visualization of a 5 mm nodular density at the right lateral costophrenic angle on image number 20/6. There is a similar area more anteriorly on image number 22/6. There is also an interval 11 x 8 mm sub solid nodular density in the right middle lobe on image number 9/6. There is also an interval nodular density in the left lower lobe with eccentric cavitation measuring 11 x 7 mm on image number 8/6. Hepatobiliary: No focal liver abnormality is seen. No gallstones, gallbladder wall thickening, or biliary dilatation. Pancreas: Unremarkable. No pancreatic ductal dilatation or surrounding inflammatory changes. Spleen: Normal in size without focal abnormality. Adrenals/Urinary Tract: An exophytic posterior left adrenal mass is again demonstrated. This measures 3.2 x 2.8 cm on image number 19/2, previously 3.2 x 2.6 cm. Stable normal appearing right adrenal gland. Interval mild dilatation of the right renal collecting system and ureter to the level of the ureterovesical junction with no obstructing stone or mass visualized. Stable  normal appearing left kidney and ureter. The urinary bladder is poorly distended with mild to moderate diffuse wall thickening. Stomach/Bowel: A small hiatal hernia is unchanged. Unremarkable small  bowel and colon. Normal appearing appendix. Vascular/Lymphatic: Bilateral enlarged pelvic and inguinal lymph nodes with progression. The largest is a left pelvic sidewall node with a short axis diameter of 17 mm on image number 75/2, previously 14 mm. Reproductive: A large left adnexal mass is again demonstrated with an interval increase in size. This measures 13.0 x 8.2 cm on image number 64/2, previously 10.8 x 8.8 cm. A right adnexal mass is again demonstrated, currently measuring 3.4 x 2.7 cm on image number 67/2, previously 2.9 cm in maximum diameter. Normal appearing uterus. Other: Small umbilical and infraumbilical hernias containing fat. Small left inguinal hernia containing fat. Musculoskeletal: Interval diffuse heterogeneous bone sclerosis. IMPRESSION: 1. Increased size of bilateral ovarian masses, especially on the left. 2. Interval diffuse bony metastatic disease. 3. Progressive metastatic bilateral pelvic and inguinal lymphadenopathy. 4. Increased size of a left adrenal metastasis. 5. Interval nodular densities at the lung bases suspicious for metastases. 6. Interval mild right hydronephrosis and hydroureter to the level of the ureterovesical junction. This could be due to recent stone passage, obstruction by wall thickening of the bladder or a nonvisualized obstructing ureteral metastasis. Electronically Signed   By: Claudie Revering M.D.   On: 02/20/2021 18:28   CT HEAD WO CONTRAST (5MM)  Result Date: 02/22/2021 CLINICAL DATA:  Encephalopathy EXAM: CT HEAD WITHOUT CONTRAST TECHNIQUE: Contiguous axial images were obtained from the base of the skull through the vertex without intravenous contrast. RADIATION DOSE REDUCTION: This exam was performed according to the departmental dose-optimization program which includes automated exposure control, adjustment of the mA and/or kV according to patient size and/or use of iterative reconstruction technique. COMPARISON:  None. FINDINGS: Brain: There is no mass,  hemorrhage or extra-axial collection. The size and configuration of the ventricles and extra-axial CSF spaces are normal. The brain parenchyma is normal, without acute or chronic infarction. Vascular: No abnormal hyperdensity of the major intracranial arteries or dural venous sinuses. No intracranial atherosclerosis. Skull: The visualized skull base, calvarium and extracranial soft tissues are normal. Sinuses/Orbits: No fluid levels or advanced mucosal thickening of the visualized paranasal sinuses. No mastoid or middle ear effusion. The orbits are normal. IMPRESSION: Normal head CT. Electronically Signed   By: Ulyses Jarred M.D.   On: 02/22/2021 22:14   MR BRAIN W WO CONTRAST  Result Date: 03/03/2021 CLINICAL DATA:  Metastatic disease evaluation EXAM: MRI HEAD WITHOUT AND WITH CONTRAST TECHNIQUE: Multiplanar, multiecho pulse sequences of the brain and surrounding structures were obtained without and with intravenous contrast. CONTRAST:  62m GADAVIST GADOBUTROL 1 MMOL/ML IV SOLN COMPARISON:  No prior MRI, correlation is made with CT head 02/22/2021 FINDINGS: Evaluation is somewhat limited by motion artifact, particularly on postcontrast imaging. Brain: No restricted diffusion to suggest acute or subacute infarct. No acute hemorrhage, mass, mass effect, or midline shift. No foci of hemosiderin deposition to suggest remote hemorrhage. Scattered T2 hyperintense signal in the periventricular white matter, likely the sequela of mild chronic small vessel ischemic disease. No hydrocephalus or extra-axial collection. Vascular: Normal flow voids. Skull and upper cervical spine: No suspicious osseous lesions. Sinuses/Orbits: Negative. Other: None. IMPRESSION: Evaluation is limited by motion artifact, particularly on postcontrast imaging. Within this limitation no definite enhancing foci to suggest metastatic disease. No acute intracranial process. Electronically Signed   By: AMerilyn BabaM.D.   On: 03/03/2021 23:47  DG CHEST PORT 1 VIEW  Result Date: 02/20/2021 CLINICAL DATA:  History of gastric and ovarian cancer. Fatigue, nausea, vomiting, diarrhea EXAM: PORTABLE CHEST 1 VIEW COMPARISON:  12/31/2020 FINDINGS: Right Port-A-Cath in place with the tip at the cavoatrial junction. Heart is borderline in size. Mild vascular congestion. No confluent airspace opacities, effusions or overt edema. No acute bony abnormality. IMPRESSION: Borderline heart size.  Mild vascular congestion. Electronically Signed   By: Rolm Baptise M.D.   On: 02/20/2021 21:30   EEG adult  Result Date: 03/07/2021 Lora Havens, MD     03/07/2021  8:48 AM Patient Name: Erin [name] MRN: [id number] Epilepsy Attending: Lora Havens Referring Physician/Provider: Elodia Florence., MD Date: 03/06/2021 Duration: 21.53 mins Patient history: 56 year old female with altered mental status.  EEG to evaluate for seizure. Level of alertness: Awake, asleep AEDs during EEG study: None Technical aspects: This EEG study was done with scalp electrodes positioned according to the 10-20 International system of electrode placement. Electrical activity was acquired at a sampling rate of _0  and reviewed with a high frequency filter of _1  and a low frequency filter of _2 . EEG data were recorded continuously and digitally stored. Description: The posterior dominant rhythm consists of 7 Hz activity of moderate voltage (25-35 uV) seen predominantly in posterior head regions, symmetric and reactive to eye opening and eye closing. Sleep was characterized by sleep spindles (12 to 14 Hz), maximal frontocentral region.  EEG showed continuous generalized 3 to 6 Hz theta-delta slowing. Hyperventilation and photic stimulation were not performed.   ABNORMALITY - Continuous slow, generalized - Background slow IMPRESSION: This study is suggestive of moderate diffuse encephalopathy, nonspecific etiology. No seizures or epileptiform discharges were seen throughout  the recording. Lora Havens   ECHOCARDIOGRAM COMPLETE  Result Date: 03/02/2021    ECHOCARDIOGRAM REPORT   Patient Name:   ERIELLE GAWRONSKI Ferdinand Date of Exam: 03/02/2021 Medical Rec #:  [id number]           Height:       64.0 in Accession #:    [id number]          Weight:       246.7 lb Date of Birth:  1965/09/14           BSA:          2.139 m Patient Age:    56 years            BP:           99/59 mmHg Patient Gender: F                   HR:           100 bpm. Exam Location:  Inpatient Procedure: 2D Echo, Cardiac Doppler and Color Doppler Indications:    BACTEREMIA  History:        Patient has no prior history of Echocardiogram examinations.                 Signs/Symptoms:Shortness of Breath and Bacteremia; Risk                 Factors:Hypertension and Diabetes. HLD.  Sonographer:    Beryle Beams Referring Phys: [id number] [place] T VU  Sonographer Comments: Patient is morbidly obese. PATIENT REFUSED SSN/ ALSO HOLDING BREATHE DURING STUDY. IMPRESSIONS  1. Left ventricular ejection fraction, by estimation, is 60 to 65%. The left ventricle has normal function. The left ventricle has no regional wall motion abnormalities.  Left ventricular diastolic parameters are consistent with Grade I diastolic dysfunction (impaired relaxation).  2. Right ventricular systolic function is normal. The right ventricular size is normal. There is mildly elevated pulmonary artery systolic pressure.  3. A small pericardial effusion is present.  4. The mitral valve is normal in structure. No evidence of mitral valve regurgitation. No evidence of mitral stenosis.  5. The aortic valve is tricuspid. There is moderate calcification of the aortic valve. There is mild thickening of the aortic valve. Aortic valve regurgitation is not visualized. No aortic stenosis is present.  6. The inferior vena cava is normal in size with greater than 50% respiratory variability, suggesting right atrial pressure of 3 mmHg. FINDINGS  Left Ventricle: Left  ventricular ejection fraction, by estimation, is 60 to 65%. The left ventricle has normal function. The left ventricle has no regional wall motion abnormalities. The left ventricular internal cavity size was normal in size. There is  no left ventricular hypertrophy. Left ventricular diastolic parameters are consistent with Grade I diastolic dysfunction (impaired relaxation). Normal left ventricular filling pressure. Right Ventricle: The right ventricular size is normal. No increase in right ventricular wall thickness. Right ventricular systolic function is normal. There is mildly elevated pulmonary artery systolic pressure. The tricuspid regurgitant velocity is 3.21  m/s, and with an assumed right atrial pressure of 3 mmHg, the estimated right ventricular systolic pressure is 13.0 mmHg. Left Atrium: Left atrial size was normal in size. Right Atrium: Right atrial size was normal in size. Pericardium: A small pericardial effusion is present. Mitral Valve: The mitral valve is normal in structure. No evidence of mitral valve regurgitation. No evidence of mitral valve stenosis. Tricuspid Valve: The tricuspid valve is normal in structure. Tricuspid valve regurgitation is mild . No evidence of tricuspid stenosis. Aortic Valve: The aortic valve is tricuspid. There is moderate calcification of the aortic valve. There is mild thickening of the aortic valve. Aortic valve regurgitation is not visualized. No aortic stenosis is present. Aortic valve mean gradient measures 3.5 mmHg. Aortic valve peak gradient measures 7.3 mmHg. Aortic valve area, by VTI measures 2.13 cm. Pulmonic Valve: The pulmonic valve was normal in structure. Pulmonic valve regurgitation is mild. No evidence of pulmonic stenosis. Aorta: The aortic root is normal in size and structure. Venous: The inferior vena cava is normal in size with greater than 50% respiratory variability, suggesting right atrial pressure of 3 mmHg. IAS/Shunts: No atrial level shunt  detected by color flow Doppler.  LEFT VENTRICLE PLAX 2D LVIDd:         4.40 cm     Diastology LVIDs:         2.30 cm     LV e' medial:    9.79 cm/s LV PW:         1.10 cm     LV E/e' medial:  7.7 LV IVS:        0.90 cm     LV e' lateral:   12.60 cm/s LVOT diam:     2.00 cm     LV E/e' lateral: 6.0 LV SV:         42 LV SV Index:   20 LVOT Area:     3.14 cm  LV Volumes (MOD) LV vol d, MOD A2C: 50.2 ml LV vol d, MOD A4C: 38.9 ml LV vol s, MOD A2C: 8.5 ml LV vol s, MOD A4C: 9.6 ml LV SV MOD A2C:     41.7 ml LV SV MOD A4C:  38.9 ml LV SV MOD BP:      36.7 ml RIGHT VENTRICLE             IVC RV S prime:     17.90 cm/s  IVC diam: 2.00 cm TAPSE (M-mode): 2.4 cm LEFT ATRIUM             Index        RIGHT ATRIUM           Index LA diam:        4.30 cm 2.01 cm/m   RA Area:     16.70 cm LA Vol (A2C):   44.6 ml 20.85 ml/m  RA Volume:   48.50 ml  22.68 ml/m LA Vol (A4C):   49.8 ml 23.28 ml/m LA Biplane Vol: 48.1 ml 22.49 ml/m  AORTIC VALVE                    PULMONIC VALVE AV Area (Vmax):    1.98 cm     PV Vmax:       0.89 m/s AV Area (Vmean):   1.98 cm     PV Vmean:      55.000 cm/s AV Area (VTI):     2.13 cm     PV VTI:        0.118 m AV Vmax:           135.50 cm/s  PV Peak grad:  3.1 mmHg AV Vmean:          88.550 cm/s  PV Mean grad:  1.0 mmHg AV VTI:            0.196 m AV Peak Grad:      7.3 mmHg AV Mean Grad:      3.5 mmHg LVOT Vmax:         85.25 cm/s LVOT Vmean:        55.800 cm/s LVOT VTI:          0.133 m LVOT/AV VTI ratio: 0.68  AORTA Ao Root diam: 2.90 cm Ao Asc diam:  3.10 cm MITRAL VALVE                TRICUSPID VALVE MV Area (PHT): 3.11 cm     TV Peak grad:   33.4 mmHg MV Decel Time: 244 msec     TV Mean grad:   22.0 mmHg MV E velocity: 75.30 cm/s   TV Vmax:        2.89 m/s MV A velocity: 122.00 cm/s  TV Vmean:       224.0 cm/s MV E/A ratio:  0.62         TV VTI:         0.78 msec                             TR Peak grad:   41.2 mmHg                             TR Vmax:        321.00 cm/s                               SHUNTS  Systemic VTI:  0.13 m                             Systemic Diam: 2.00 cm Skeet Latch MD Electronically signed by Skeet Latch MD Signature Date/Time: 03/02/2021/3:52:35 PM    Final      Future Appointments  Date Time Provider [place]  03/13/2021 12:45 PM [facility] PHLEBOTOMIST [facility] None  03/13/2021  1:00 PM [facility] FLUSH ROOM [facility] None  03/13/2021  1:15 PM Owens Shark, NP [facility] None       LOS: 19 days

## 2021-03-11 NOTE — Progress Notes (Signed)
Pt refused cpap for the night.  

## 2021-03-11 NOTE — TOC Progression Note (Addendum)
Transition of Care [facility]) - Progression Note    Patient Details  Name: LECIA ESPERANZA MRN: [id number] Date of Birth: 05-22-1965  Transition of Care [facility]) CM/SW Contact  Purcell Mouton, RN Phone Number: 03/11/2021, 12:13 PM  Clinical Narrative:    Spoke with two facilities today concerning placement for this pt. Waiting for one to offer pt a bed. Pt is not getting Chemo and only getting one pain medication. Plan for discharge after rehab is to return home with her sister.    Expected Discharge Plan: Home/Self Care Barriers to Discharge: No Barriers Identified  Expected Discharge Plan and Services Expected Discharge Plan: Home/Self Care       Living arrangements for the past 2 months: Single Family Home                                       Social Determinants of Health (SDOH) Interventions    Readmission Risk Interventions Readmission Risk Prevention Plan 01/06/2021  Transportation Screening Complete  Medication Review [employer]) Complete  PCP or Specialist appointment within 3-5 days of discharge Complete  HRI or Home Care Consult Complete  SW Recovery Care/Counseling Consult Complete  Palliative Care Screening Not [place] Complete  Some recent data might be hidden

## 2021-03-12 ENCOUNTER — Inpatient Hospital Stay (HOSPITAL_COMMUNITY): Payer: 59

## 2021-03-12 DIAGNOSIS — R609 Edema, unspecified: Secondary | ICD-10-CM | POA: Diagnosis not present

## 2021-03-12 LAB — CULTURE, BLOOD (ROUTINE X 2)
Culture: NO GROWTH
Culture: NO GROWTH
Special Requests: ADEQUATE
Special Requests: ADEQUATE

## 2021-03-12 NOTE — Progress Notes (Signed)
PHYSICAL THERAPY  Pt working with OT this morning and was in recliner but then needed to get back to bed for Dopplers.  Now requesting to rest.  Will attempt to see tomorrow.  Rica Koyanagi  PTA Acute  Rehabilitation Services Pager      [phone] Office      [phone]

## 2021-03-12 NOTE — TOC Progression Note (Signed)
Transition of Care [facility]) - Progression Note    Patient Details  Name: Erin [name] MRN: [id number] Date of Birth: [birth year]  Transition of Care [facility]) CM/SW Contact  Purcell Mouton, RN Phone Number: 03/12/2021, 10:07 AM  Clinical Narrative:    [facility] called this morning, they are accepting pt to SNF. Blanca Friend will start Insurance authorization. Pt told [facility] that she did not want to go to [facility]. However, this is the only bed offer. A call was made to pt's niece Latrice who agreed with pt going to [place] in [place] related to that being closer to niece, she will call pt. Also spoke with pt's aunt Oris Drone who also agreed with pt going to [place] in [place]. Pt's aunt Oris Drone is concerned about pt's disability and prognosis. Both niece and aunt are very pleasant and involved in pt's well being.    Expected Discharge Plan: Home/Self Care Barriers to Discharge: No Barriers Identified  Expected Discharge Plan and Services Expected Discharge Plan: Home/Self Care       Living arrangements for the past 2 months: Single Family Home                                       Social Determinants of Health (SDOH) Interventions    Readmission Risk Interventions Readmission Risk Prevention Plan 01/06/2021  Transportation Screening Complete  Medication Review Press photographer) Complete  PCP or Specialist appointment within 3-5 days of discharge Complete  HRI or Home Care Consult Complete  SW Recovery Care/Counseling Consult Complete  Palliative Care Screening Not [place] Complete  Some recent data might be hidden

## 2021-03-12 NOTE — Progress Notes (Signed)
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  Patient presented with nausea and vomiting with diarrhea and abdominal pain.  Hospitalization was complicated by recurrent anemia.  FOBT was negative.  Patient received 6 units of PRBC today.  Platelets improving.  Found to have Enterococcus faecalis bacteremia.  Patient on ampicillin per infectious disease.  At this time waiting placement at skilled nursing facility.  Oncology continues to follow, future decisions regarding her cancer treatment will be based on performance status and nutritional status.  If these do not improve, oncology likely will recommend hospice.    Subjective   Patient denies any complaints.   Assessment/Plan:     Bacteremia due to Enterococcus -Likely GI source, ID was consulted -There was concern for possible endocarditis, TTE was negative for vegetation, repeat blood culture on 1/22 showed no growth to date -ID recommendations (1/24 sign off note and OPAT orders 1/25): Ampicillin IV x 4 weeks.  Consider rechecking TTE and repeat blood cultures based on performance status and her candidacy for chemotherapy in the future.  Needs TEE and repeat cultures to assure clearance prior to restarting chemo to ensure port is not infected and no concerns for endocarditis.  Follow up with ID clinic if improvement in functional status.  Severe anemia -Secondary to chemotherapy -Patient received 6 units PRBC to date -Hemoglobin is 7.3 today; follow CBC in a.m.  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 -Follows Dr. Malachy Mood, receiving chemotherapy -Awaiting skilled  nursing facility placement -Currently awaiting skilled nursing facility placement -No tenderness or pain at port site today  Bilateral lower extremity edema -BNP 208.3, venous duplex of lower extremities negative for DVT -Albumin is less than 1.5, likely anasarca  Severe sepsis, not present on admission -Port site does not appear to be infected -Unclear source -Started on ampicillin IV  Acute metabolic encephalopathy -Oxycodone induced -Significantly improved -MRI brain on 5/23 with motion artifact, no acute intracranial process -EEG with moderate diffuse encephalopathy, no seizures or epileptiform discharges  Thrombocytopenia -Secondary to chemotherapy  Hypertension -Blood pressure is stable -Continue Coreg, amlodipine -HCTZ on hold due to sepsis  Protein calorie malnutrition -Dietitian consulted  Diabetes mellitus type 2 -Continue sliding scale insulin with NovoLog, hemoglobin A1c 7.1     Medications     sodium chloride   Intravenous Once   carvedilol  6.25 mg Oral BID WC   Chlorhexidine Gluconate Cloth  6 each Topical Daily   feeding supplement  1 Container Oral Q24H   feeding supplement  237 mL Oral Q24H   mouth rinse  15 mL Mouth Rinse BID   multivitamin with minerals  1 tablet Oral Daily   pantoprazole  40 mg Oral Daily     Data Reviewed:   CBG:  Recent Labs  Lab 03/10/21 1654 03/10/21 2035 03/11/21 0743 03/11/21 1114 03/11/21 2124  GLUCAP 101* 91 95 125* 88    SpO2: 100 % O2 Flow Rate (L/min): 2 L/min    Vitals:   03/12/21 0500 03/12/21 0535 03/12/21 1308 03/12/21 2023  BP:  126/82 125/88 118/79  Pulse:  92 93 95  Resp:  20 18 19   Temp:  (!) 97.5 F (36.4 C) 98 F (36.7 C) 97.8 F (36.6  C)  TempSrc:  Oral  Oral  SpO2:  99% 95% 100%  Weight: 119.7 kg     Height:          Data Reviewed:  Basic Metabolic Panel: Recent Labs  Lab 03/07/21 0507 03/08/21 0425 03/09/21 0145 03/10/21 0321 03/11/21 0349  NA 134* 134* 133* 133*  134*  K 4.1 4.1 4.0 4.1 4.0  CL 105 106 105 105 108  CO2 21* 20* 23 23 22   GLUCOSE 100* 89 97 103* 117*  BUN 22* 17 15 15 15   CREATININE 0.77 0.72 0.69 0.69 0.71  CALCIUM 8.4* 8.5* 8.5* 8.5* 8.3*  MG 2.0 1.9 1.9 2.0 2.0  PHOS 3.2 2.7 2.7 2.9 2.9    CBC: Recent Labs  Lab 03/07/21 0507 03/08/21 0425 03/09/21 0145 03/09/21 1707 03/10/21 0321 03/10/21 1617 03/11/21 0349  WBC 18.5* 17.4* 14.1*  --  14.2*  --  15.8*  NEUTROABS 15.9* 14.9* 12.0*  --  11.6*  --  13.3*  HGB 7.7* 7.9* 7.2* 7.3* 7.1* 7.8* 7.3*  HCT 25.1* 25.3* 22.8* 23.6* 23.7* 24.9* 23.7*  MCV 94.7 95.5 96.6  --  97.5  --  97.5  PLT 149* 159 176  --  149*  --  135*       Antibiotics: Anti-infectives (From admission, onward)    Start     Dose/Rate Route Frequency Ordered Stop   03/02/21 0800  ampicillin (OMNIPEN) 2 g in sodium chloride 0.9 % 100 mL IVPB  Status:  Discontinued        2 g 300 mL/hr over 20 Minutes Intravenous Every 4 hours 03/02/21 0619 03/02/21 0643   03/02/21 0730  ampicillin (OMNIPEN) 2 g in sodium chloride 0.9 % 100 mL IVPB        2 g 300 mL/hr over 20 Minutes Intravenous Every 4 hours 03/02/21 0643 03/29/21 2359        DVT prophylaxis: SCDs  Code Status: Full code  Family Communication: No family at bedside      Objective    Physical Examination:   General-appears in no acute distress Heart-S1-S2, regular, no murmur auscultated Lungs-clear to auscultation bilaterally, no wheezing or crackles auscultated Abdomen-soft, nontender, no organomegaly Extremities-bilateral edema in the lower extremities  Neuro-alert, oriented x3, no focal deficit noted  Status is: Inpatient , awaiting bed at skilled nursing facility             [place] If 7PM-7AM, please contact night-coverage at [email or web address], Office  [phone]   03/12/2021, 8:26 PM  LOS: 20 days

## 2021-03-12 NOTE — Progress Notes (Signed)
Bilateral lower extremity venous duplex has been completed. Preliminary results can be found in CV Proc through chart review.   03/12/21 10:08 AM Carlos Levering RVT

## 2021-03-12 NOTE — Progress Notes (Signed)
Occupational Therapy Treatment Patient Details Name: Erin [name] MRN: [id number] DOB: 1965/10/14 Today's Date: 03/12/2021   History of present illness 56 y.o. 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 Patient presented secondary to nausea/vomiting/diarrhea and abdominal pain. Hospitalization complicated by recurrent anemia. Fecal occult blood test negative. Patient has received 5 units of PRBC to date. Platelets improving. Now found to have E. Faecalis bacteremia. Per chart, hospice may be consulted.   OT comments  Patient was noted to make continued progress since evaluation session. Patient was mod A for bed mobility with increased time and HOB raised. Patient was able to transfer with mod A for sit to stand and min A for continuation of transfer to recliner with cues for proper hand and foot placement. Patient would continue to benefit from skilled OT services at this time while admitted and after d/c to address noted deficits in order to improve overall safety and independence in ADLs.     Recommendations for follow up therapy are one component of a multi-disciplinary discharge planning process, led by the attending physician.  Recommendations may be updated based on patient status, additional functional criteria and insurance authorization.    Follow Up Recommendations  Skilled nursing-short term rehab (<3 hours/day)    Assistance Recommended at Discharge Frequent or constant Supervision/Assistance  Patient can return home with the following  A little help with walking and/or transfers;Two people to help with walking and/or transfers;A little help with bathing/dressing/bathroom   Equipment Recommendations  Other (comment) (defer to next venue)    Recommendations for Other Services      Precautions / Restrictions Precautions Precautions:  Fall Restrictions Weight Bearing Restrictions: No       Mobility Bed Mobility Overal bed mobility: Needs Assistance       Supine to sit: Mod assist, HOB elevated     General bed mobility comments: with increased time physical assist for BLE and trunk.    Transfers                         Balance Overall balance assessment: Needs assistance Sitting-balance support: No upper extremity supported, Feet supported Sitting balance-Leahy Scale: Good     Standing balance support: Bilateral upper extremity supported, Reliant on assistive device for balance Standing balance-Leahy Scale: Poor                             ADL either performed or assessed with clinical judgement   ADL Overall ADL's : Needs assistance/impaired     Grooming: Wash/dry face;Wash/dry hands;Sitting;Set up;Supervision/safety   Upper Body Bathing: Set up;Sitting;Supervision/ safety   Lower Body Bathing: Maximal assistance;Sit to/from stand;Sitting/lateral leans           Toilet Transfer: Moderate assistance;Rolling walker (2 wheels) Toilet Transfer Details (indicate cue type and reason): to transfer to recliner in room with RW.                Extremity/Trunk Assessment              Vision       Perception     Praxis      Cognition Arousal/Alertness: Awake/alert Behavior During Therapy: Flat affect Overall Cognitive Status: Impaired/Different from baseline  Problem Solving: Slow processing, Decreased initiation, Difficulty sequencing, Requires verbal cues          Exercises      Shoulder Instructions       General Comments      Pertinent Vitals/ Pain       Pain Assessment Pain Assessment: Faces Faces Pain Scale: Hurts little more Pain Location: back with movement Pain Descriptors / Indicators: Grimacing Pain Intervention(s): Monitored during session, Patient requesting pain meds-RN notified  Home  Living                                          Prior Functioning/Environment              Frequency  Min 2X/week        Progress Toward Goals  OT Goals(current goals can now be found in the care plan section)  Progress towards OT goals: Progressing toward goals     Plan Discharge plan remains appropriate    Co-evaluation                 AM-PAC OT "6 Clicks" Daily Activity     Outcome Measure   Help from another person eating meals?: A Little Help from another person taking care of personal grooming?: A Little Help from another person toileting, which includes using toliet, bedpan, or urinal?: Total Help from another person bathing (including washing, rinsing, drying)?: A Lot Help from another person to put on and taking off regular upper body clothing?: A Little Help from another person to put on and taking off regular lower body clothing?: A Lot 6 Click Score: 14    End of Session Equipment Utilized During Treatment: Gait belt;Rolling walker (2 wheels)  OT Visit Diagnosis: Muscle weakness (generalized) (M62.81);Pain;Unsteadiness on feet (R26.81);Other symptoms and signs involving cognitive function   Activity Tolerance Patient tolerated treatment well   Patient Left with call bell/phone within reach;in chair;with chair alarm set   Nurse Communication Mobility status;Patient requests pain meds        Time: 0820-0857 OT Time Calculation (min): 37 min  Charges: OT General Charges $OT Visit: 1 Visit OT Treatments $Self Care/Home Management : 23-37 mins  Jackelyn Poling OTR/L, MS Acute Rehabilitation Department Office# [phone] Pager# [phone]   Marcellina Millin 03/12/2021, 12:26 PM

## 2021-03-13 ENCOUNTER — Other Ambulatory Visit (HOSPITAL_COMMUNITY): Payer: Self-pay

## 2021-03-13 ENCOUNTER — Inpatient Hospital Stay: Payer: 59

## 2021-03-13 ENCOUNTER — Inpatient Hospital Stay: Payer: 59 | Admitting: Nurse Practitioner

## 2021-03-13 ENCOUNTER — Encounter: Payer: Self-pay | Admitting: Oncology

## 2021-03-13 DIAGNOSIS — B952 Enterococcus as the cause of diseases classified elsewhere: Secondary | ICD-10-CM | POA: Diagnosis not present

## 2021-03-13 DIAGNOSIS — R7881 Bacteremia: Secondary | ICD-10-CM | POA: Diagnosis not present

## 2021-03-13 LAB — COMPREHENSIVE METABOLIC PANEL
ALT: 10 U/L (ref 0–44)
AST: 18 U/L (ref 15–41)
Albumin: <1.5 g/dL — ABNORMAL LOW (ref 3.5–5.0)
Alkaline Phosphatase: 157 U/L — ABNORMAL HIGH (ref 38–126)
Anion gap: 5 (ref 5–15)
BUN: 13 mg/dL (ref 6–20)
CO2: 23 mmol/L (ref 22–32)
Calcium: 8.3 mg/dL — ABNORMAL LOW (ref 8.9–10.3)
Chloride: 107 mmol/L (ref 98–111)
Creatinine, Ser: 0.7 mg/dL (ref 0.44–1.00)
GFR, Estimated: >60 mL/min (ref >60)
Glucose, Bld: 88 mg/dL (ref 70–99)
Potassium: 3.6 mmol/L (ref 3.5–5.1)
Sodium: 135 mmol/L (ref 135–145)
Total Bilirubin: 1 mg/dL (ref 0.3–1.2)
Total Protein: 5.6 g/dL — ABNORMAL LOW (ref 6.5–8.1)

## 2021-03-13 LAB — CBC
HCT: 24 % — ABNORMAL LOW (ref 36.0–46.0)
Hemoglobin: 7.3 g/dL — ABNORMAL LOW (ref 12.0–15.0)
MCH: 29.8 pg (ref 26.0–34.0)
MCHC: 30.4 g/dL (ref 30.0–36.0)
MCV: 98 fL (ref 80.0–100.0)
Platelets: 137 10*3/uL — ABNORMAL LOW (ref 150–400)
RBC: 2.45 MIL/uL — ABNORMAL LOW (ref 3.87–5.11)
RDW: 17.4 % — ABNORMAL HIGH (ref 11.5–15.5)
WBC: 15.3 10*3/uL — ABNORMAL HIGH (ref 4.0–10.5)
nRBC: 0.1 % (ref 0.0–0.2)

## 2021-03-13 MED ORDER — SENNOSIDES-DOCUSATE SODIUM 8.6-50 MG PO TABS: 1.0000 | ORAL_TABLET | Freq: Every evening | ORAL | Status: AC | PRN

## 2021-03-13 MED ORDER — OXYCODONE HCL 5 MG PO TABS
5.0000 mg | ORAL_TABLET | ORAL | Status: DC | PRN
  Administered 2021-03-13 (×2): 10 mg via ORAL
  Filled 2021-03-13 (×2): qty 2

## 2021-03-13 MED ORDER — BENZONATATE 100 MG PO CAPS
100.0000 mg | ORAL_CAPSULE | Freq: Three times a day (TID) | ORAL | 0 refills | Status: AC | PRN
  Filled 2021-03-13: qty 20, 7d supply, fill #0

## 2021-03-13 MED ORDER — OXYCODONE HCL 5 MG PO TABS: 5.0000 mg | ORAL_TABLET | ORAL | 0 refills | Status: AC | PRN

## 2021-03-13 MED ORDER — HEPARIN SOD (PORK) LOCK FLUSH 100 UNIT/ML IV SOLN
500.0000 [IU] | INTRAVENOUS | Status: AC | PRN
  Administered 2021-03-13: 500 [IU]
  Filled 2021-03-13: qty 5

## 2021-03-13 MED ORDER — OXYCODONE HCL 5 MG PO TABS: 7.5000 mg | ORAL_TABLET | ORAL | Status: DC | PRN

## 2021-03-13 MED ORDER — ENSURE ENLIVE PO LIQD
237.0000 mL | ORAL | 12 refills | Status: AC
  Filled 2021-03-13: qty 237, 1d supply, fill #0

## 2021-03-13 MED ORDER — FENTANYL 25 MCG/HR TD PT72
1.0000 | MEDICATED_PATCH | TRANSDERMAL | Status: DC
  Administered 2021-03-13: 1 via TRANSDERMAL
  Filled 2021-03-13: qty 1

## 2021-03-13 MED ORDER — FENTANYL 25 MCG/HR TD PT72: 1.0000 | MEDICATED_PATCH | TRANSDERMAL | 0 refills | Status: AC

## 2021-03-13 MED ORDER — AMPICILLIN IV (FOR PTA / DISCHARGE USE ONLY): 2.0000 g | INTRAVENOUS | 0 refills | Status: AC

## 2021-03-13 NOTE — TOC Progression Note (Signed)
Transition of Care [facility]) - Progression Note    Patient Details  Name: Erin [name] MRN: [id number] Date of Birth: 1965-02-13  Transition of [facility]) CM/SW Contact  Purcell Mouton, RN Phone Number: 03/13/2021, 12:56 PM  Clinical Narrative:    [facility] accepted pt and have insurance auth. Niece Erin [name] was called and made aware that pt would transfer to [place] today. [facility] was called.    Expected Discharge Plan: Home/Self Care Barriers to Discharge: No Barriers Identified  Expected Discharge Plan and Services Expected Discharge Plan: Home/Self Care       Living arrangements for the past 2 months: Single Family Home Expected Discharge Date: 03/13/21                                     Social Determinants of Health (SDOH) Interventions    Readmission Risk Interventions Readmission Risk Prevention Plan 01/06/2021  Transportation Screening Complete  Medication Review Press photographer) Complete  PCP or Specialist appointment within 3-5 days of discharge Complete  HRI or Home Care Consult Complete  SW Recovery Care/Counseling Consult Complete  Palliative Care Screening Not [place] Complete  Some recent data might be hidden

## 2021-03-13 NOTE — Plan of Care (Signed)
°  Problem: Clinical Measurements: Goal: Respiratory complications will improve Outcome: Adequate for Discharge   Problem: Clinical Measurements: Goal: Cardiovascular complication will be avoided Outcome: Adequate for Discharge   Problem: Nutrition: Goal: Adequate nutrition will be maintained Outcome: Adequate for Discharge   Problem: Activity: Goal: Risk for activity intolerance will decrease Outcome: Adequate for Discharge   Problem: Elimination: Goal: Will not experience complications related to bowel motility Outcome: Adequate for Discharge   Problem: Pain Managment: Goal: General experience of comfort will improve Outcome: Adequate for Discharge

## 2021-03-13 NOTE — Discharge Summary (Signed)
Physician Discharge Summary   Patient: Erin [name] MRN: [id number] DOB: 1965/05/26  Admit date:     02/20/2021  Discharge date: 03/13/21  Discharge Physician: Oswald Hillock   PCP: Pcp, No   Recommendations at discharge:   Patient to be discharged on ampicillin 2 g every 4 hours for Enterococcus bacteremia, last date 03/29/2021 Follow-up oncology next week ID recommendation- Needs TEE and repeat cultures to assure clearance prior to restarting chemo to ensure port is not infected and no concerns for endocarditis.  Follow up with ID clinic if improvement in functional status.  Discharge Diagnoses: Principal Problem:   Bacteremia due to Enterococcus Active Problems:   Essential hypertension   Diabetes mellitus (HCC)   OSA (obstructive sleep apnea)   Obesity hypoventilation syndrome (HCC)   Hypokalemia   Adenocarcinoma (HCC)   Metastatic adenocarcinoma of unknown origin (HCC)   Cancer related pain   Severe sepsis (HCC)   Severe anemia   Protein-calorie malnutrition, severe   Thrombocytopenia (HCC)   Leukocytosis   Acute metabolic encephalopathy   Lower extremity edema  Resolved Problems:   Hypotension   Hospital Course: Erin [name] is a 56 y.o. 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 Patient presented secondary to nausea/vomiting/diarrhea and abdominal pain. Hospitalization complicated by recurrent anemia. Fecal occult blood test negative. Patient has received 6 units of PRBC to date. Platelets improving. Now found to have E. Faecalis bacteremia.  On ampicillin per infectious disease.  At this time, working on placement, she's been difficult to place up until this point.  Oncology continues to follow, future decisions regarding her cancer treatment will be based on her performance status and nutritional status.    See below for additional  details  Assessment and Plan: * Bacteremia due to Enterococcus S/p Infectious disease consulted. Likely GI source. Concern for possible endocarditis. Transthoracic Echocardiogram without evidence of vegetation. Repeat blood cultures (1/22) are no growth to date ID recommendations (1/24 sign off note and OPAT orders 1/25): Ampicillin IV x 4 weeks.  Continue antibiotics till 03/29/2021.  Consider rechecking TTE and repeat blood cultures based on performance status and her candidacy for chemotherapy in the future.  Needs TEE and repeat cultures to assure clearance prior to restarting chemo to ensure port is not infected and no concerns for endocarditis.  Follow up with ID clinic if improvement in functional status.  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 -Follows Dr. Malachy Mood, receiving chemotherapy -Awaiting skilled nursing facility placement -No tenderness or pain at port site today   Bilateral lower extremity edema -BNP 208.3, venous duplex of lower extremities negative for DVT -Albumin is less than 1.5, likely anasarca   Severe sepsis, not present on admission -Port site does not appear to be infected -Unclear source -Started on ampicillin IV   Acute metabolic encephalopathy -Oxycodone induced -Significantly improved -MRI brain on 5/23 with motion artifact, no acute intracranial process -EEG with moderate diffuse encephalopathy, no seizures or epileptiform discharges   Thrombocytopenia -Secondary to chemotherapy   Hypertension -Blood pressure is stable -Continue Coreg -Amlodipine and HCTZ on hold due to sepsis   Protein calorie malnutrition -Dietitian consulted   Diabetes mellitus type 2 -Continue sliding scale insulin with NovoLog, hemoglobin A1c 7.1  Consultants: Oncology Procedures performed: None Disposition: Skilled nursing facility Diet recommendation:  Discharge Diet Orders (From admission, onward)     Start     Ordered   03/13/21 0000  Diet - low sodium heart healthy        03/13/21 1253           Regular diet  DISCHARGE MEDICATION: Allergies as of 03/13/2021       Reactions   Tramadol Other (See Comments)   Per patient increased HR and sweating        Medication List     STOP taking these medications    amLODipine 5 MG tablet Commonly known as: NORVASC   cyclobenzaprine 5 MG tablet Commonly known as: FLEXERIL   hydrochlorothiazide 25 MG tablet Commonly known as: HYDRODIURIL   polyethylene glycol 17 g packet Commonly known as: MIRALAX / GLYCOLAX   potassium chloride 10 MEQ CR capsule Commonly known as: MICRO-K   potassium chloride SA 20 MEQ tablet Commonly known as: KLOR-CON M   prochlorperazine 10 MG tablet Commonly known as: COMPAZINE   rosuvastatin 10 MG tablet Commonly known as: CRESTOR       TAKE these medications    ampicillin  IVPB Inject 2 g into the vein every 4 (four) hours for 16 days. Indication: Enterococcal bacteremia   First Dose: Yes Last Day of Therapy:  03/29/21 Labs - Once weekly:  CBC/D and BMP, Labs - Every other week:  ESR and CRP Method of administration: Ambulatory Pump (Continuous Infusion) Method of administration may be changed at the discretion of home infusion pharmacist based upon assessment of the patient and/or caregiver's ability to self-administer the medication ordered.   benzonatate 100 MG capsule Commonly known as: TESSALON Take 1 capsule (100 mg total) by mouth 3 (three) times daily as needed for cough.   carvedilol 6.25 MG tablet Commonly known as: COREG TAKE 1 TABLET BY MOUTH 2 TIMES DAILY WITH A MEAL. What changed: how much to take   diclofenac Sodium 1 % Gel Commonly known as: VOLTAREN Apply 2 g topically 4 (four)  times daily as needed (msk pain).   feeding supplement Liqd Take 237 mLs by mouth daily.   fentaNYL 25 MCG/HR Commonly known as: [place] 1 patch onto the skin every 3 (three) days. Start taking on: March 16, 2021   gabapentin 300 MG capsule Commonly known as: NEURONTIN Take 1 capsule (300 mg total) by mouth at bedtime.   liver oil-zinc oxide 40 % ointment Commonly known as: DESITIN Apply topically as needed for irritation.   loperamide 2 MG capsule Commonly known as: IMODIUM Take 1 capsule (2 mg total) by mouth 4 (four) times daily as needed for diarrhea or loose stools.   metFORMIN 500 MG tablet Commonly known as: GLUCOPHAGE Take 1 tablet (500 mg total) by mouth daily.   ondansetron 8 MG tablet Commonly known as: ZOFRAN Take 1 tablet (8 mg total) by mouth every 8 (eight) hours as needed for nausea or vomiting.   oxyCODONE 5 MG immediate release tablet Commonly known as: Oxy IR/ROXICODONE Take 1-2 tablets (5-10 mg total) by mouth every 4 (four) hours as needed for severe pain.   pantoprazole 40 MG tablet Commonly known as: PROTONIX Take 1 tablet (40 mg total) by mouth daily.   senna-docusate 8.6-50 MG tablet Commonly known as: Senokot-S Take 1 tablet by mouth at bedtime as needed for mild constipation.               Home  Infusion Instuctions  (From admission, onward)           Start     Ordered   03/13/21 0000  Home infusion instructions       Question:  Instructions  Answer:  Flushing of vascular access device: 0.9% NaCl pre/post medication administration and prn patency; Heparin 100 u/ml, 40m for implanted ports and Heparin 10u/ml, 529mfor all other central venous catheters.   03/13/21 1253             Discharge Exam: Filed Weights   03/11/21 0623 03/12/21 0500 03/13/21 0521  Weight: 120 kg 119.7 kg 120.2 kg   General-appears in no acute distress Heart-S1-S2, regular, no murmur auscultated Lungs-clear to auscultation bilaterally,  no wheezing or crackles auscultated Abdomen-soft, nontender, no organomegaly Extremities-2+ edema in the lower extremities Neuro-alert, oriented x3, no focal deficit noted  Condition at discharge: good  The results of significant diagnostics from this hospitalization (including imaging, microbiology, ancillary and laboratory) are listed below for reference.   Imaging Studies: CT Abdomen Pelvis Wo Contrast  Result Date: 02/20/2021 CLINICAL DATA:  Acute, non localized abdominal pain. Nausea, vomiting, diarrhea and fatigue for the past week. Receiving chemotherapy for gastric and ovarian cancer. EXAM: CT ABDOMEN AND PELVIS WITHOUT CONTRAST TECHNIQUE: Multidetector CT imaging of the abdomen and pelvis was performed following the standard protocol without IV contrast. RADIATION DOSE REDUCTION: This exam was performed according to the departmental dose-optimization program which includes automated exposure control, adjustment of the mA and/or kV according to patient size and/or use of iterative reconstruction technique. COMPARISON:  Chest CT dated 12/17/2020. Abdomen and pelvis CT dated 12/16/2020. FINDINGS: Lower chest: Mildly enlarged heart. Small amount of residual atelectasis/scarring in the right lower lobe, including a somewhat oval wedge-shaped area at the posterior right lung base which is significantly smaller. Interval visualization of a 5 mm nodular density at the right lateral costophrenic angle on image number 20/6. There is a similar area more anteriorly on image number 22/6. There is also an interval 11 x 8 mm sub solid nodular density in the right middle lobe on image number 9/6. There is also an interval nodular density in the left lower lobe with eccentric cavitation measuring 11 x 7 mm on image number 8/6. Hepatobiliary: No focal liver abnormality is seen. No gallstones, gallbladder wall thickening, or biliary dilatation. Pancreas: Unremarkable. No pancreatic ductal dilatation or surrounding  inflammatory changes. Spleen: Normal in size without focal abnormality. Adrenals/Urinary Tract: An exophytic posterior left adrenal mass is again demonstrated. This measures 3.2 x 2.8 cm on image number 19/2, previously 3.2 x 2.6 cm. Stable normal appearing right adrenal gland. Interval mild dilatation of the right renal collecting system and ureter to the level of the ureterovesical junction with no obstructing stone or mass visualized. Stable normal appearing left kidney and ureter. The urinary bladder is poorly distended with mild to moderate diffuse wall thickening. Stomach/Bowel: A small hiatal hernia is unchanged. Unremarkable small bowel and colon. Normal appearing appendix. Vascular/Lymphatic: Bilateral enlarged pelvic and inguinal lymph nodes with progression. The largest is a left pelvic sidewall node with a short axis diameter of 17 mm on image number 75/2, previously 14 mm. Reproductive: A large left adnexal mass is again demonstrated with an interval increase in size. This measures 13.0 x 8.2 cm on image number 64/2, previously 10.8 x 8.8 cm. A right adnexal mass is again demonstrated, currently measuring 3.4 x 2.7 cm on image number 67/2, previously 2.9 cm in maximum diameter. Normal appearing  uterus. Other: Small umbilical and infraumbilical hernias containing fat. Small left inguinal hernia containing fat. Musculoskeletal: Interval diffuse heterogeneous bone sclerosis. IMPRESSION: 1. Increased size of bilateral ovarian masses, especially on the left. 2. Interval diffuse bony metastatic disease. 3. Progressive metastatic bilateral pelvic and inguinal lymphadenopathy. 4. Increased size of a left adrenal metastasis. 5. Interval nodular densities at the lung bases suspicious for metastases. 6. Interval mild right hydronephrosis and hydroureter to the level of the ureterovesical junction. This could be due to recent stone passage, obstruction by wall thickening of the bladder or a nonvisualized  obstructing ureteral metastasis. Electronically Signed   By: Claudie Revering M.D.   On: 02/20/2021 18:28   CT HEAD WO CONTRAST (5MM)  Result Date: 02/22/2021 CLINICAL DATA:  Encephalopathy EXAM: CT HEAD WITHOUT CONTRAST TECHNIQUE: Contiguous axial images were obtained from the base of the skull through the vertex without intravenous contrast. RADIATION DOSE REDUCTION: This exam was performed according to the departmental dose-optimization program which includes automated exposure control, adjustment of the mA and/or kV according to patient size and/or use of iterative reconstruction technique. COMPARISON:  None. FINDINGS: Brain: There is no mass, hemorrhage or extra-axial collection. The size and configuration of the ventricles and extra-axial CSF spaces are normal. The brain parenchyma is normal, without acute or chronic infarction. Vascular: No abnormal hyperdensity of the major intracranial arteries or dural venous sinuses. No intracranial atherosclerosis. Skull: The visualized skull base, calvarium and extracranial soft tissues are normal. Sinuses/Orbits: No fluid levels or advanced mucosal thickening of the visualized paranasal sinuses. No mastoid or middle ear effusion. The orbits are normal. IMPRESSION: Normal head CT. Electronically Signed   By: Ulyses Jarred M.D.   On: 02/22/2021 22:14   MR BRAIN W WO CONTRAST  Result Date: 03/03/2021 CLINICAL DATA:  Metastatic disease evaluation EXAM: MRI HEAD WITHOUT AND WITH CONTRAST TECHNIQUE: Multiplanar, multiecho pulse sequences of the brain and surrounding structures were obtained without and with intravenous contrast. CONTRAST:  57m GADAVIST GADOBUTROL 1 MMOL/ML IV SOLN COMPARISON:  No prior MRI, correlation is made with CT head 02/22/2021 FINDINGS: Evaluation is somewhat limited by motion artifact, particularly on postcontrast imaging. Brain: No restricted diffusion to suggest acute or subacute infarct. No acute hemorrhage, mass, mass effect, or midline  shift. No foci of hemosiderin deposition to suggest remote hemorrhage. Scattered T2 hyperintense signal in the periventricular white matter, likely the sequela of mild chronic small vessel ischemic disease. No hydrocephalus or extra-axial collection. Vascular: Normal flow voids. Skull and upper cervical spine: No suspicious osseous lesions. Sinuses/Orbits: Negative. Other: None. IMPRESSION: Evaluation is limited by motion artifact, particularly on postcontrast imaging. Within this limitation no definite enhancing foci to suggest metastatic disease. No acute intracranial process. Electronically Signed   By: AMerilyn BabaM.D.   On: 03/03/2021 23:47   DG CHEST PORT 1 VIEW  Result Date: 03/11/2021 CLINICAL DATA:  Cough, former smoker. EXAM: PORTABLE CHEST 1 VIEW COMPARISON:  02/20/2021. FINDINGS: The heart is enlarged the mediastinal contour is stable. A stable right chest port terminates over the superior vena cava. Airspace opacities are noted in the mid to lower lung fields bilaterally and the medial aspect of the left upper lobe. No effusion or pneumothorax. Sclerotic regions are noted within the bones. IMPRESSION: 1. Airspace disease in the mid to lower lung fields bilaterally and medial aspect of the left upper lobe, suspicious for pneumonia. 2. Cardiomegaly. 3. 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 Electronically Signed   By: LRegan RakersD.  On: 03/11/2021 20:15   DG CHEST PORT 1 VIEW  Result Date: 02/20/2021 CLINICAL DATA:  History of gastric and ovarian cancer. Fatigue, nausea, vomiting, diarrhea EXAM: PORTABLE CHEST 1 VIEW COMPARISON:  12/31/2020 FINDINGS: Right Port-A-Cath in place with the tip at the cavoatrial junction. Heart is borderline in size. Mild vascular congestion. No confluent airspace opacities, effusions or overt edema. No acute bony abnormality. IMPRESSION: Borderline heart size.  Mild vascular congestion. Electronically Signed   By: Rolm Baptise M.D.   On: 02/20/2021 21:30   EEG adult  Result Date: 03/07/2021 Lora Havens, MD     03/07/2021  8:48 AM Patient Name: ANAAYA FUSTER MRN: [id number] Epilepsy Attending: Lora Havens Referring Physician/Provider: Elodia Florence., MD Date: 03/06/2021 Duration: 21.53 mins Patient history: 56 year old female with altered mental status.  EEG to evaluate for seizure. Level of alertness: Awake, asleep AEDs during EEG study: None Technical aspects: This EEG study was done with scalp electrodes positioned according to the 10-20 International system of electrode placement. Electrical activity was acquired at a sampling rate of _0  and reviewed with a high frequency filter of _1  and a low frequency filter of _2 . EEG data were recorded continuously and digitally stored. Description: The posterior dominant rhythm consists of 7 Hz activity of moderate voltage (25-35 uV) seen predominantly in posterior head regions, symmetric and reactive to eye opening and eye closing. Sleep was characterized by sleep spindles (12 to 14 Hz), maximal frontocentral region.  EEG showed continuous generalized 3 to 6 Hz theta-delta slowing. Hyperventilation and photic stimulation were not performed.   ABNORMALITY - Continuous slow, generalized - Background slow IMPRESSION: This study is suggestive of moderate diffuse encephalopathy, nonspecific etiology. No seizures or epileptiform discharges were seen throughout the recording. Lora Havens   ECHOCARDIOGRAM COMPLETE  Result Date: 03/02/2021    ECHOCARDIOGRAM REPORT   Patient Name:   HAILLEY BYERS Gift Date of Exam: 03/02/2021 Medical Rec #:  [id number]           Height:       64.0 in Accession #:    [id number]          Weight:       246.7 lb Date of Birth:  Oct 25, 1965           BSA:          2.139 m Patient Age:    42 years            BP:           99/59 mmHg Patient Gender: F                   HR:           100 bpm. Exam Location:  Inpatient Procedure: 2D  Echo, Cardiac Doppler and Color Doppler Indications:    BACTEREMIA  History:        Patient has no prior history of Echocardiogram examinations.                 Signs/Symptoms:Shortness of Breath and Bacteremia; Risk                 Factors:Hypertension and Diabetes. HLD.  Sonographer:    Beryle Beams Referring Phys: [id number] [place] T VU  Sonographer Comments: Patient is morbidly obese. PATIENT REFUSED SSN/ ALSO HOLDING BREATHE DURING STUDY. IMPRESSIONS  1. Left ventricular ejection fraction, by estimation, is 60 to 65%. The left ventricle has normal function. The left ventricle  has no regional wall motion abnormalities. Left ventricular diastolic parameters are consistent with Grade I diastolic dysfunction (impaired relaxation).  2. Right ventricular systolic function is normal. The right ventricular size is normal. There is mildly elevated pulmonary artery systolic pressure.  3. A small pericardial effusion is present.  4. The mitral valve is normal in structure. No evidence of mitral valve regurgitation. No evidence of mitral stenosis.  5. The aortic valve is tricuspid. There is moderate calcification of the aortic valve. There is mild thickening of the aortic valve. Aortic valve regurgitation is not visualized. No aortic stenosis is present.  6. The inferior vena cava is normal in size with greater than 50% respiratory variability, suggesting right atrial pressure of 3 mmHg. FINDINGS  Left Ventricle: Left ventricular ejection fraction, by estimation, is 60 to 65%. The left ventricle has normal function. The left ventricle has no regional wall motion abnormalities. The left ventricular internal cavity size was normal in size. There is  no left ventricular hypertrophy. Left ventricular diastolic parameters are consistent with Grade I diastolic dysfunction (impaired relaxation). Normal left ventricular filling pressure. Right Ventricle: The right ventricular size is normal. No increase in right ventricular wall  thickness. Right ventricular systolic function is normal. There is mildly elevated pulmonary artery systolic pressure. The tricuspid regurgitant velocity is 3.21  m/s, and with an assumed right atrial pressure of 3 mmHg, the estimated right ventricular systolic pressure is 53.9 mmHg. Left Atrium: Left atrial size was normal in size. Right Atrium: Right atrial size was normal in size. Pericardium: A small pericardial effusion is present. Mitral Valve: The mitral valve is normal in structure. No evidence of mitral valve regurgitation. No evidence of mitral valve stenosis. Tricuspid Valve: The tricuspid valve is normal in structure. Tricuspid valve regurgitation is mild . No evidence of tricuspid stenosis. Aortic Valve: The aortic valve is tricuspid. There is moderate calcification of the aortic valve. There is mild thickening of the aortic valve. Aortic valve regurgitation is not visualized. No aortic stenosis is present. Aortic valve mean gradient measures 3.5 mmHg. Aortic valve peak gradient measures 7.3 mmHg. Aortic valve area, by VTI measures 2.13 cm. Pulmonic Valve: The pulmonic valve was normal in structure. Pulmonic valve regurgitation is mild. No evidence of pulmonic stenosis. Aorta: The aortic root is normal in size and structure. Venous: The inferior vena cava is normal in size with greater than 50% respiratory variability, suggesting right atrial pressure of 3 mmHg. IAS/Shunts: No atrial level shunt detected by color flow Doppler.  LEFT VENTRICLE PLAX 2D LVIDd:         4.40 cm     Diastology LVIDs:         2.30 cm     LV e' medial:    9.79 cm/s LV PW:         1.10 cm     LV E/e' medial:  7.7 LV IVS:        0.90 cm     LV e' lateral:   12.60 cm/s LVOT diam:     2.00 cm     LV E/e' lateral: 6.0 LV SV:         42 LV SV Index:   20 LVOT Area:     3.14 cm  LV Volumes (MOD) LV vol d, MOD A2C: 50.2 ml LV vol d, MOD A4C: 38.9 ml LV vol s, MOD A2C: 8.5 ml LV vol s, MOD A4C: 9.6 ml LV SV MOD A2C:     41.7 ml  LV SV  MOD A4C:     38.9 ml LV SV MOD BP:      36.7 ml RIGHT VENTRICLE             IVC RV S prime:     17.90 cm/s  IVC diam: 2.00 cm TAPSE (M-mode): 2.4 cm LEFT ATRIUM             Index        RIGHT ATRIUM           Index LA diam:        4.30 cm 2.01 cm/m   RA Area:     16.70 cm LA Vol (A2C):   44.6 ml 20.85 ml/m  RA Volume:   48.50 ml  22.68 ml/m LA Vol (A4C):   49.8 ml 23.28 ml/m LA Biplane Vol: 48.1 ml 22.49 ml/m  AORTIC VALVE                    PULMONIC VALVE AV Area (Vmax):    1.98 cm     PV Vmax:       0.89 m/s AV Area (Vmean):   1.98 cm     PV Vmean:      55.000 cm/s AV Area (VTI):     2.13 cm     PV VTI:        0.118 m AV Vmax:           135.50 cm/s  PV Peak grad:  3.1 mmHg AV Vmean:          88.550 cm/s  PV Mean grad:  1.0 mmHg AV VTI:            0.196 m AV Peak Grad:      7.3 mmHg AV Mean Grad:      3.5 mmHg LVOT Vmax:         85.25 cm/s LVOT Vmean:        55.800 cm/s LVOT VTI:          0.133 m LVOT/AV VTI ratio: 0.68  AORTA Ao Root diam: 2.90 cm Ao Asc diam:  3.10 cm MITRAL VALVE                TRICUSPID VALVE MV Area (PHT): 3.11 cm     TV Peak grad:   33.4 mmHg MV Decel Time: 244 msec     TV Mean grad:   22.0 mmHg MV E velocity: 75.30 cm/s   TV Vmax:        2.89 m/s MV A velocity: 122.00 cm/s  TV Vmean:       224.0 cm/s MV E/A ratio:  0.62         TV VTI:         0.78 msec                             TR Peak grad:   41.2 mmHg                             TR Vmax:        321.00 cm/s                              SHUNTS  Systemic VTI:  0.13 m                             Systemic Diam: 2.00 cm Skeet Latch MD Electronically signed by Skeet Latch MD Signature Date/Time: 03/02/2021/3:52:35 PM    Final    VAS [place] LOWER EXTREMITY VENOUS (DVT)  Result Date: 03/12/2021  Lower Venous DVT Study Patient Name:  EVAROSE ALTLAND Valade  Date of Exam:   03/12/2021 Medical Rec #: [id number]            Accession #:    [id number] Date of Birth: [birth year]            Patient Gender: F Patient  Age:   56 years Exam Location:  [facility] Procedure:      VAS [place] LOWER EXTREMITY VENOUS (DVT) Referring Phys: A POWELL JR --------------------------------------------------------------------------------  Indications: Edema.  Risk Factors: Cancer. Limitations: Body habitus and poor ultrasound/tissue interface. Comparison Study: No prior studies. Performing Technologist: Oliver Hum RVT  Examination Guidelines: A complete evaluation includes B-mode imaging, spectral Doppler, color Doppler, and power Doppler as needed of all accessible portions of each vessel. Bilateral testing is considered an integral part of a complete examination. Limited examinations for reoccurring indications may be performed as noted. The reflux portion of the exam is performed with the patient in reverse Trendelenburg.  +---------+---------------+---------+-----------+----------+--------------+  RIGHT     Compressibility Phasicity Spontaneity Properties Thrombus Aging  +---------+---------------+---------+-----------+----------+--------------+  CFV       Full            Yes       Yes                                    +---------+---------------+---------+-----------+----------+--------------+  SFJ       Full                                                             +---------+---------------+---------+-----------+----------+--------------+  FV Prox   Full                                                             +---------+---------------+---------+-----------+----------+--------------+  FV Mid    Full                                                             +---------+---------------+---------+-----------+----------+--------------+  FV Distal Full                                                             +---------+---------------+---------+-----------+----------+--------------+  PFV       Full                                                              +---------+---------------+---------+-----------+----------+--------------+  POP       Full            Yes       Yes                                    +---------+---------------+---------+-----------+----------+--------------+  PTV       Full                                                             +---------+---------------+---------+-----------+----------+--------------+  PERO      Full                                                             +---------+---------------+---------+-----------+----------+--------------+   +---------+---------------+---------+-----------+----------+--------------+  LEFT      Compressibility Phasicity Spontaneity Properties Thrombus Aging  +---------+---------------+---------+-----------+----------+--------------+  CFV       Full            Yes       Yes                                    +---------+---------------+---------+-----------+----------+--------------+  SFJ       Full                                                             +---------+---------------+---------+-----------+----------+--------------+  FV Prox   Full                                                             +---------+---------------+---------+-----------+----------+--------------+  FV Mid    Full                                                             +---------+---------------+---------+-----------+----------+--------------+  FV Distal Full                                                             +---------+---------------+---------+-----------+----------+--------------+  PFV       Full                                                             +---------+---------------+---------+-----------+----------+--------------+  POP       Full            Yes       Yes                                    +---------+---------------+---------+-----------+----------+--------------+  PTV       Full                                                              +---------+---------------+---------+-----------+----------+--------------+  PERO      Full                                                             +---------+---------------+---------+-----------+----------+--------------+     Summary: RIGHT: - There is no evidence of deep vein thrombosis in the lower extremity. However, portions of this examination were limited- see technologist comments above.  - No cystic structure found in the popliteal fossa.  LEFT: - There is no evidence of deep vein thrombosis in the lower extremity. However, portions of this examination were limited- see technologist comments above.  - No cystic structure found in the popliteal fossa.  *See table(s) above for measurements and observations. Electronically signed by Jamelle Haring on 03/12/2021 at 3:38:01 PM.    Final     Microbiology: Results for orders placed or performed during the hospital encounter of 02/20/21  Resp Panel by RT-PCR (Flu A&B, Covid) Nasopharyngeal Swab     Status: None   Collection Time: 02/20/21  5:46 PM   Specimen: Nasopharyngeal Swab; Nasopharyngeal(NP) swabs in vial transport medium  Result Value Ref Range Status   SARS Coronavirus 2 by RT PCR NEGATIVE NEGATIVE Final    Comment: (NOTE) SARS-CoV-2 target nucleic acids are NOT DETECTED.  The SARS-CoV-2 RNA is generally detectable in upper respiratory specimens during the acute phase of infection. The lowest concentration of SARS-CoV-2 viral copies this assay can detect is 138 copies/mL. A negative result does not preclude SARS-Cov-2 infection and should not be used as the sole basis for treatment or other patient management decisions. A negative result may occur with  improper specimen collection/handling, submission of specimen other than nasopharyngeal swab, presence of viral mutation(s) within the areas targeted by this assay, and inadequate number of viral copies(<138 copies/mL). A negative result must be combined with clinical observations,  patient history, and epidemiological information. The expected result is Negative.  Fact Sheet for Patients:  EntrepreneurPulse.com.au  Fact Sheet for Healthcare Providers:  IncredibleEmployment.be  This test is no t yet approved or cleared by the [place] FDA and  has been authorized for detection and/or diagnosis of SARS-CoV-2 by FDA under an Emergency Use Authorization (EUA). This EUA will remain  in effect (meaning this test can be used) for the duration of the COVID-19 declaration under Section 564(b)(1) of the Act, 21 U.S.C.section 360bbb-3(b)(1), unless the authorization is terminated  or revoked sooner.       Influenza A by PCR NEGATIVE NEGATIVE Final   Influenza B by PCR NEGATIVE NEGATIVE  Final    Comment: (NOTE) The Xpert Xpress SARS-CoV-2/FLU/RSV plus assay is intended as an aid in the diagnosis of influenza from Nasopharyngeal swab specimens and should not be used as a sole basis for treatment. Nasal washings and aspirates are unacceptable for Xpert Xpress SARS-CoV-2/FLU/RSV testing.  Fact Sheet for Patients: EntrepreneurPulse.com.au  Fact Sheet for Healthcare Providers: IncredibleEmployment.be  This test is not yet approved or cleared by the [place] FDA and has been authorized for detection and/or diagnosis of SARS-CoV-2 by FDA under an Emergency Use Authorization (EUA). This EUA will remain in effect (meaning this test can be used) for the duration of the COVID-19 declaration under Section 564(b)(1) of the Act, 21 U.S.C. section 360bbb-3(b)(1), unless the authorization is terminated or revoked.  Performed at [facility], [place] [address] [place], [place] [address]   Urine Culture     Status: Abnormal   Collection Time: 02/24/21  9:49 AM   Specimen: Urine, Clean Catch  Result Value Ref Range Status   Specimen Description   Final    URINE, CLEAN  CATCH Performed at [facility], [place] [address], [place], [place] [address]    Special Requests   Final    NONE Performed at [facility], [place] [address], [place], [place] [address]    Culture (A)  Final    <10,000 COLONIES/mL INSIGNIFICANT GROWTH Performed at [place] [address], [place], [place] [address]    Report Status 02/26/2021 FINAL  Final  Culture, blood (routine x 2)     Status: Abnormal   Collection Time: 03/01/21 11:03 AM   Specimen: BLOOD  Result Value Ref Range Status   Specimen Description   Final    BLOOD RIGHT ANTECUBITAL Performed at [place] [address] [place], [place] [address]    Special Requests   Final    BOTTLES DRAWN AEROBIC AND ANAEROBIC Blood Culture adequate volume Performed at [place] [address] [place], [place] [address]    Culture  Setup Time   Final    GRAM POSITIVE COCCI IN BOTH AEROBIC AND ANAEROBIC BOTTLES CRITICAL RESULT CALLED TO, READ BACK BY AND VERIFIED WITH: M LILLISTON,[email or web address]  03/02/21 [place] Performed at [facility], [place] [address] [place], [place] [address]    Culture ENTEROCOCCUS FAECALIS (A)  Final   Report Status 03/04/2021 FINAL  Final   Organism ID, Bacteria ENTEROCOCCUS FAECALIS  Final      Susceptibility   Enterococcus faecalis - MIC*    AMPICILLIN <=2 SENSITIVE Sensitive     VANCOMYCIN 1 SENSITIVE Sensitive     GENTAMICIN SYNERGY SENSITIVE Sensitive     * ENTEROCOCCUS FAECALIS  Culture, blood (routine x 2)     Status: Abnormal   Collection Time: 03/01/21 11:03 AM   Specimen: BLOOD  Result Value Ref Range Status   Specimen Description   Final    BLOOD BLOOD LEFT HAND Performed at [facility], [place] [address], [place], [place] [address]    Special Requests   Final    BOTTLES DRAWN AEROBIC AND ANAEROBIC Blood Culture adequate volume Performed at [place] [address], [place], [place] [address]    Culture  Setup Time   Final    GRAM POSITIVE COCCI IN BOTH AEROBIC AND ANAEROBIC BOTTLES CRITICAL VALUE NOTED.  VALUE IS CONSISTENT WITH PREVIOUSLY REPORTED AND CALLED VALUE.    Culture (A)  Final  ENTEROCOCCUS FAECALIS SUSCEPTIBILITIES PERFORMED ON PREVIOUS CULTURE WITHIN THE LAST 5 DAYS. Performed at [facility], [place] [address], [place], [place] [address]    Report Status 03/04/2021 FINAL  Final  Blood Culture ID Panel (Reflexed)     Status: Abnormal   Collection Time: 03/01/21 11:03 AM  Result Value Ref Range Status   Enterococcus faecalis DETECTED (A) NOT DETECTED Final    Comment: CRITICAL RESULT CALLED TO, READ BACK BY AND VERIFIED WITH: M LILLISTON,[email or web address]  03/02/21 [place]    Enterococcus Faecium NOT DETECTED NOT DETECTED Final   Listeria monocytogenes NOT DETECTED NOT DETECTED Final   Staphylococcus species NOT DETECTED NOT DETECTED Final   Staphylococcus aureus (BCID) NOT DETECTED NOT DETECTED Final   Staphylococcus epidermidis NOT DETECTED NOT DETECTED Final   Staphylococcus lugdunensis NOT DETECTED NOT DETECTED Final   Streptococcus species NOT DETECTED NOT DETECTED Final   Streptococcus agalactiae NOT DETECTED NOT DETECTED Final   Streptococcus pneumoniae NOT DETECTED NOT DETECTED Final   Streptococcus pyogenes NOT DETECTED NOT DETECTED Final   A.calcoaceticus-baumannii NOT DETECTED NOT DETECTED Final   Bacteroides fragilis NOT DETECTED NOT DETECTED Final   Enterobacterales NOT DETECTED NOT DETECTED Final   Enterobacter cloacae complex NOT DETECTED NOT DETECTED Final   Escherichia coli NOT DETECTED NOT DETECTED Final   Klebsiella aerogenes NOT DETECTED NOT DETECTED Final   Klebsiella oxytoca NOT DETECTED NOT DETECTED Final   Klebsiella pneumoniae NOT DETECTED NOT DETECTED Final   Proteus species NOT DETECTED NOT DETECTED Final   Salmonella species NOT DETECTED NOT DETECTED Final   Serratia marcescens NOT DETECTED  NOT DETECTED Final   Haemophilus influenzae NOT DETECTED NOT DETECTED Final   Neisseria meningitidis NOT DETECTED NOT DETECTED Final   Pseudomonas aeruginosa NOT DETECTED NOT DETECTED Final   Stenotrophomonas maltophilia NOT DETECTED NOT DETECTED Final   Candida albicans NOT DETECTED NOT DETECTED Final   Candida auris NOT DETECTED NOT DETECTED Final   Candida glabrata NOT DETECTED NOT DETECTED Final   Candida krusei NOT DETECTED NOT DETECTED Final   Candida parapsilosis NOT DETECTED NOT DETECTED Final   Candida tropicalis NOT DETECTED NOT DETECTED Final   Cryptococcus neoformans/gattii NOT DETECTED NOT DETECTED Final   Vancomycin resistance NOT DETECTED NOT DETECTED Final    Comment: Performed at [facility], [address] [place], [place] [address]  Culture, blood (routine x 2)     Status: None   Collection Time: 03/02/21  4:18 PM   Specimen: BLOOD  Result Value Ref Range Status   Specimen Description   Final    BLOOD LEFT ANTECUBITAL Performed at [place] [address] [place], [place] [address]    Special Requests   Final    BOTTLES DRAWN AEROBIC ONLY Blood Culture adequate volume Performed at [place] [address] [place], [place] [address]    Culture   Final    NO GROWTH 5 DAYS Performed at [facility], [place] [address], [place], [place] [address]    Report Status 03/07/2021 FINAL  Final  Culture, blood (routine x 2)     Status: None   Collection Time: 03/02/21  4:18 PM   Specimen: BLOOD  Result Value Ref Range Status   Specimen Description   Final    BLOOD LEFT ANTECUBITAL Performed at [place] [address], [place], [place] [address]    Special Requests   Final    BOTTLES DRAWN AEROBIC AND ANAEROBIC Blood Culture  adequate volume Performed at [place] [address] [place], [place] [address]    Culture   Final    NO GROWTH 5 DAYS Performed at [facility], [place] [address] [place], [place] [address]    Report Status 03/07/2021 FINAL  Final  Culture, blood (routine x 2)     Status: None   Collection Time: 03/07/21 10:52 AM   Specimen: Left Antecubital; Blood  Result Value Ref Range Status   Specimen Description   Final    LEFT ANTECUBITAL Performed at [place] [address], [place], [place] [address]    Special Requests   Final    BOTTLES DRAWN AEROBIC AND ANAEROBIC Blood Culture adequate volume Performed at [place] [address] [place] [address] [place], [place] [address]    Culture   Final    NO GROWTH 5 DAYS Performed at [facility], [place] [address], [place], [place] [address]    Report Status 03/12/2021 FINAL  Final  Culture, blood (routine x 2)     Status: None   Collection Time: 03/07/21 10:52 AM   Specimen: Left Antecubital; Blood  Result Value Ref Range Status   Specimen Description   Final    LEFT ANTECUBITAL Performed at [place] [address], [place], [place] [address]    Special Requests   Final    BOTTLES DRAWN AEROBIC AND ANAEROBIC Blood Culture adequate volume Performed at [place] [address] [place], [place] [address]    Culture   Final    NO GROWTH 5 DAYS Performed at [facility], [place] [address] [place],  [address]    Report Status 03/12/2021 FINAL  Final    Labs: CBC: Recent Labs  Lab 03/07/21 0507 03/08/21 0425 03/09/21 0145 03/09/21 1707 03/10/21 0321 03/10/21 1617 03/11/21 0349 03/13/21 0439  WBC 18.5* 17.4* 14.1*  --  14.2*  --  15.8* 15.3*  NEUTROABS 15.9* 14.9* 12.0*  --  11.6*  --  13.3*  --   HGB 7.7* 7.9* 7.2* 7.3* 7.1* 7.8* 7.3* 7.3*  HCT 25.1* 25.3* 22.8* 23.6* 23.7* 24.9* 23.7* 24.0*  MCV 94.7 95.5 96.6  --  97.5  --  97.5 98.0  PLT 149* 159 176  --  149*  --  135* 184*   Basic Metabolic Panel: Recent Labs  Lab 03/07/21 0507 03/08/21 0425 03/09/21 0145  03/10/21 0321 03/11/21 0349 03/13/21 0439  NA 134* 134* 133* 133* 134* 135  K 4.1 4.1 4.0 4.1 4.0 3.6  CL 105 106 105 105 108 107  CO2 21* 20* _0 GLUCOSE 100* 89 97 103* 117* 88  BUN 22* _1 CREATININE 0.77 0.72 0.69 0.69 0.71 0.70  CALCIUM 8.4* 8.5* 8.5* 8.5* 8.3* 8.3*  MG 2.0 1.9 1.9 2.0 2.0  --   PHOS 3.2 2.7 2.7 2.9 2.9  --    Liver Function Tests: Recent Labs  Lab 03/08/21 0425 03/09/21 0145 03/10/21 0321 03/11/21 0349 03/13/21 0439  AST _2 ALT _3 ALKPHOS 169* 167* 159* 150* 157*  BILITOT 1.6* 1.3* 1.0 0.6 1.0  PROT 5.9* 5.7* 5.6* 5.8* 5.6*  ALBUMIN <1.5* <1.5* <1.5* <1.5* <1.5*   CBG: Recent Labs  Lab 03/10/21 1654 03/10/21 2035 03/11/21 0743 03/11/21 1114 03/11/21 2124  GLUCAP 101* 91 95 125* 88    Discharge  time spent: greater than 30 minutes.  Signed: Oswald Hillock, MD [facility] 03/13/2021

## 2021-03-13 NOTE — Plan of Care (Signed)

## 2021-03-13 NOTE — Progress Notes (Signed)
Attempted to call [facility] multiple times to call report [phone] and no one is answering the phone. Pt is awaiting for [facility] transport.

## 2021-03-13 NOTE — Progress Notes (Signed)
Physical Therapy Treatment Patient Details Name: Erin [name] MRN: [id number] DOB: [birth year] Today's Date: 03/13/2021   History of Present Illness Erin [name] is a 56 y.o. 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 Patient presented secondary to nausea/vomiting/diarrhea and abdominal pain. Hospitalization complicated by recurrent anemia. Fecal occult blood test negative. Patient has received 6 units of PRBC to date. Platelets improving. Now found to have E. Faecalis bacteremia.  On ampicillin per infectious disease.  At this time, working on placement, she's been difficult to place up until this point.  Oncology continues to follow, future decisions regarding her cancer treatment will be based on her performance status and nutritional status.    PT Comments    General Comments: AxO x 3 pleasant and willing but feeling discouraged Assisted OOB to amb to bathroom.  Pt very nervous but with + 2 assist she slowly was able to amb 10 feet to bathroom.  Assisted with peri care/hygiene.  Assisted with amb another 10 feet back to bed per pt request.  Pt tolerated session well with increased time and freq rest breaks.  "My legs are weak".  Pt also fearful of falling. Pt will need ST Rehab at SNF to address her decline in mobility.  Recommendations for follow up therapy are one component of a multi-disciplinary discharge planning process, led by the attending physician.  Recommendations may be updated based on patient status, additional functional criteria and insurance authorization.  Follow Up Recommendations  Skilled nursing-short term rehab (<3 hours/day)     Assistance Recommended at Discharge Frequent or constant Supervision/Assistance  Patient can return home with the following A lot of help with walking and/or transfers;A lot of help with  bathing/dressing/bathroom;Help with stairs or ramp for entrance;Two people to help with walking and/or transfers;Two people to help with bathing/dressing/bathroom;Assist for transportation;Assistance with cooking/housework   Equipment Recommendations  None recommended by PT    Recommendations for Other Services       Precautions / Restrictions Precautions Precautions: Fall Restrictions Weight Bearing Restrictions: No     Mobility  Bed Mobility Overal bed mobility: Needs Assistance Bed Mobility: Supine to Sit     Supine to sit: Mod assist, HOB elevated     General bed mobility comments: with increased time physical assist for BLE and trunk.    Transfers Overall transfer level: Needs assistance Equipment used: Rolling walker (2 wheels) Transfers: Sit to/from Stand Sit to Stand: Mod assist, +2 physical assistance, +2 safety/equipment, From elevated surface           General transfer comment: Assist to power up, stabilize, control descent. Cues for safety, technique. Increased time.  Also assisted with a toilet transfer.  Increased difficulty rising from lower toilet level.    Ambulation/Gait Ambulation/Gait assistance: Min assist, +2 safety/equipment Gait Distance (Feet): 20 Feet (10 feet x 2 to and from bathroom) Assistive device: Rolling walker (2 wheels) Gait Pattern/deviations: Step-to pattern, Decreased stride length, Shuffle, Wide base of support Gait velocity: decr     General Gait Details: pt was able to amb to and from bathroom a total of 20 feet + 2 assist and increased time.  Distance limted by weakness and effort.   Stairs             Wheelchair Mobility    Modified Rankin (Stroke Patients Only)       Balance  Cognition Arousal/Alertness: Awake/alert Behavior During Therapy: WFL for tasks assessed/performed Overall Cognitive Status: Within Functional Limits for tasks  assessed                                 General Comments: AxO x 3 pleasant and willing but feeling discouraged        Exercises      General Comments        Pertinent Vitals/Pain Pain Assessment Pain Assessment: Faces Faces Pain Scale: Hurts little more Pain Location: ABD Pain Descriptors / Indicators: Grimacing, Aching Pain Intervention(s): Monitored during session    Home Living                          Prior Function            PT Goals (current goals can now be found in the care plan section) Progress towards PT goals: Progressing toward goals    Frequency    Min 3X/week      PT Plan Current plan remains appropriate;Frequency needs to be updated    Co-evaluation              AM-PAC PT "6 Clicks" Mobility   Outcome Measure  Help needed turning from your back to your side while in a flat bed without using bedrails?: A Lot Help needed moving from lying on your back to sitting on the side of a flat bed without using bedrails?: A Lot Help needed moving to and from a bed to a chair (including a wheelchair)?: A Lot Help needed standing up from a chair using your arms (e.g., wheelchair or bedside chair)?: A Lot Help needed to walk in hospital room?: Total   6 Click Score: 9    End of Session Equipment Utilized During Treatment: Gait belt Activity Tolerance: Patient limited by fatigue;No increased pain Patient left: in bed;with call bell/phone within reach;with bed alarm set Nurse Communication: Mobility status PT Visit Diagnosis: Difficulty in walking, not elsewhere classified (R26.2);Muscle weakness (generalized) (M62.81)     Time: 3151-7616 PT Time Calculation (min) (ACUTE ONLY): 62 min  Charges:  $Gait Training: 23-37 mins $Therapeutic Activity: 23-37 mins                     Rica Koyanagi  PTA Acute  Rehabilitation Services Pager      [phone] Office      [phone]

## 2021-03-13 NOTE — Progress Notes (Addendum)
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 -Bladder neck, urethra, cervical biopsies 12/17/2020 -MSS, tumor mutation burden 4, K-ras amplification, PD-L1 tumor proportion score-0 -[facility] -90% intestinal malignancy (colorectal adenocarcinoma 85%) small intestine adenocarcinoma less than 5%, gastroesophageal adenocarcinoma not excluded -12/05/2020 MRI of the pelvis-poorly marginated enhancing 3.7 x 2.8 x 3.0 cm mass centered at the urethra, solid avidly enhancing bilateral ovarian masses, poorly marginated enhancing 2.4 x 2.3 x 3.0 cm mass in the left uterine cervix, mild to moderate bilateral common iliac, bilateral external iliac, and bilateral inguinal lymphadenopathy, diffuse patchy confluent nodular replacement of the pelvic osseous structures. -12/13/2020 CEA 1864, CA125 29.2 -12/16/2020 CT abdomen/pelvis-rapidly increasing size of the left ovary with some adjacent ascites, enlarging masses elsewhere, masslike area of the cervix and potentially within a urethral diverticulum, well-circumscribed left adrenal lesion measuring 3.2 x 2.6 cm, heterogeneous pattern of subtle sclerosis and lucency in the spine. -12/16/2020 MRI of the lumbar spine-diffusely abnormal appearance of the bone marrow throughout the visualized lumbar spine and pelvis highly suspicious for diffuse osseous metastatic disease, retroperitoneal/iliac adenopathy with left larger than right adnexal masses. -12/17/2020 CT chest-no acute intrathoracic pathology -Upper endoscopy 12/22/2020-gastritis, gastric nodule biopsy-adenocarcinoma, poorly differentiated with signet ring morphology -Colonoscopy 12/22/2020-polyps removed from the ascending and sigmoid colon, extrinsic compression of the sigmoid colon-tubulovillous adenoma without high-grade dysplasia, tubular adenoma and hyperplastic polyps -12/27/2020 cycle #1 FOLFOX -01/10/2021  cycle #2 FOLFOX -01/27/2021 cycle 3 FOLFOX, oxaliplatin dose reduced -02/20/2021 CT abdomen/pelvis without contrast-increased size of the bilateral ovarian masses, interval diffuse bony metastatic disease, progressive metastatic bilateral pelvic and inguinal lymphadenopathy, increased size of the left adrenal metastasis, interval nodular densities at the lung bases suspicious for metastases, interval mild right hydronephrosis and hydroureter -02/22/2021 CEA 440 2.  Abdominal pain secondary #1 3.  Constipation 4.  Normocytic anemia 5.  Leukocytosis 6.  Protein calorie malnutrition 7.  Obstructive sleep apnea 8.  Diabetes mellitus 9.  Hypertension 10.  Possible pulmonary hypertension noted on CT chest 11.  Hospital admission 02/20/2021-intractable nausea and vomiting 12.  Right hydronephrosis and hydroureter 13.  Thrombocytopenia 14.  Low-grade fever 15.  E faecalis bacteremia  Erin [name] appears unchanged.  She is less confused this morning.  She is reporting increased pain.  Will start her on a fentanyl patch and increase oxycodone this morning.  She has received OxyContin in the past and did not tolerate it well so we will avoid this.  She has some increased pain and swelling in her legs this morning, right greater than left.  Doppler ultrasound performed on 03/12/2021 was negative for DVT bilaterally.  She is waiting on skilled nursing facility placement.  Her performance status remains poor.  Recommendations: 1.  Continue supportive care and antibiotics per ID/primary team.   2.  We will start her on a fentanyl patch at 25 mcg/h.  Change every 72 hours.  We will also increase oxycodone to 5 to 10 mg every 4 hours as needed for breakthrough pain. 3.  Increase ambulation, OT/PT at the skilled nursing facility. 4.  Please call medical oncology as needed.  We will arrange outpatient follow-up after discharge.  [name], [name], [name], [name]  Erin [name] was interviewed and  examined.  Her mental status appears improved.  She complains of increased pain, chiefly in the left abdomen.  She also has bilateral leg pain.  We will add a Duragesic patch and continue oxycodone for breakthrough pain.  Outpatient follow-up will be  scheduled at the Cancer center.  We will consider beginning salvage chemotherapy if her performance status improves.  I was present for greater than 50% of today's visit.  I performed medical decision making.  SUBJECTIVE: Less confused this morning.  Reporting increased pain.  Had Doppler ultrasound performed yesterday with no evidence of DVT bilaterally.  Oncology History  Gastric cancer ([place])  12/27/2020 Initial Diagnosis   Gastric cancer ([place])   12/27/2020 -  Chemotherapy   Patient is on Treatment Plan : GASTRIC FOLFOX q14d x 12 cycles      PHYSICAL EXAMINATION:  Vitals:   03/12/21 2023 03/13/21 0456  BP: 118/79 110/84  Pulse: 95 97  Resp: 19 18  Temp: 97.8 F (36.6 C) (!) 97.5 F (36.4 C)  SpO2: 100% 99%   Filed Weights   03/11/21 0623 03/12/21 0500 03/13/21 0521  Weight: 120 kg 119.7 kg 120.2 kg    Intake/Output from previous day: 02/01 0701 - 02/02 0700 In: 1330 [P.O.:830; IV Piggyback:500] Out: 450 [Urine:450] HEENT: No thrush or bleeding Abdomen: Soft, tender in the left lower abdomen Vascular: Trace pitting edema to lower leg and foot bilaterally, right greater than left Skin: Petechial rash-improved  Port-A-Cath without erythema  LABORATORY DATA:  I have reviewed the data as listed CMP Latest Ref Rng & Units 03/13/2021 03/11/2021 03/10/2021  Glucose 70 - 99 mg/dL 88 117(H) 103(H)  BUN 6 - 20 mg/dL $Remove'13 15 15  'lwdXhSj$ Creatinine 0.44 - 1.00 mg/dL 0.70 0.71 0.69  Sodium 135 - 145 mmol/L 135 134(L) 133(L)  Potassium 3.5 - 5.1 mmol/L 3.6 4.0 4.1  Chloride 98 - 111 mmol/L 107 108 105  CO2 22 - 32 mmol/L $RemoveB'23 22 23  'AjeSiPAW$ Calcium 8.9 - 10.3 mg/dL 8.3(L) 8.3(L) 8.5(L)  Total Protein 6.5 - 8.1 g/dL 5.6(L) 5.8(L) 5.6(L)  Total  Bilirubin 0.3 - 1.2 mg/dL 1.0 0.6 1.0  Alkaline Phos 38 - 126 U/L 157(H) 150(H) 159(H)  AST 15 - 41 U/L $Remo'18 16 18  'XlSLk$ ALT 0 - 44 U/L $Remo'10 10 9    'ebWSq$ Lab Results  Component Value Date   WBC 15.3 (H) 03/13/2021   HGB 7.3 (L) 03/13/2021   HCT 24.0 (L) 03/13/2021   MCV 98.0 03/13/2021   PLT 137 (L) 03/13/2021   NEUTROABS 13.3 (H) 03/11/2021    Lab Results  Component Value Date   CEA1 440.0 (H) 02/22/2021   CEA 617.97 (H) 02/11/2021    CT Abdomen Pelvis Wo Contrast  Result Date: 02/20/2021 CLINICAL DATA:  Acute, non localized abdominal pain. Nausea, vomiting, diarrhea and fatigue for the past week. Receiving chemotherapy for gastric and ovarian cancer. EXAM: CT ABDOMEN AND PELVIS WITHOUT CONTRAST TECHNIQUE: Multidetector CT imaging of the abdomen and pelvis was performed following the standard protocol without IV contrast. RADIATION DOSE REDUCTION: This exam was performed according to the departmental dose-optimization program which includes automated exposure control, adjustment of the mA and/or kV according to patient size and/or use of iterative reconstruction technique. COMPARISON:  Chest CT dated 12/17/2020. Abdomen and pelvis CT dated 12/16/2020. FINDINGS: Lower chest: Mildly enlarged heart. Small amount of residual atelectasis/scarring in the right lower lobe, including a somewhat oval wedge-shaped area at the posterior right lung base which is significantly smaller. Interval visualization of a 5 mm nodular density at the right lateral costophrenic angle on image number 20/6. There is a similar area more anteriorly on image number 22/6. There is also an interval 11 x 8 mm sub solid nodular density in the right middle lobe  on image number 9/6. There is also an interval nodular density in the left lower lobe with eccentric cavitation measuring 11 x 7 mm on image number 8/6. Hepatobiliary: No focal liver abnormality is seen. No gallstones, gallbladder wall thickening, or biliary dilatation. Pancreas:  Unremarkable. No pancreatic ductal dilatation or surrounding inflammatory changes. Spleen: Normal in size without focal abnormality. Adrenals/Urinary Tract: An exophytic posterior left adrenal mass is again demonstrated. This measures 3.2 x 2.8 cm on image number 19/2, previously 3.2 x 2.6 cm. Stable normal appearing right adrenal gland. Interval mild dilatation of the right renal collecting system and ureter to the level of the ureterovesical junction with no obstructing stone or mass visualized. Stable normal appearing left kidney and ureter. The urinary bladder is poorly distended with mild to moderate diffuse wall thickening. Stomach/Bowel: A small hiatal hernia is unchanged. Unremarkable small bowel and colon. Normal appearing appendix. Vascular/Lymphatic: Bilateral enlarged pelvic and inguinal lymph nodes with progression. The largest is a left pelvic sidewall node with a short axis diameter of 17 mm on image number 75/2, previously 14 mm. Reproductive: A large left adnexal mass is again demonstrated with an interval increase in size. This measures 13.0 x 8.2 cm on image number 64/2, previously 10.8 x 8.8 cm. A right adnexal mass is again demonstrated, currently measuring 3.4 x 2.7 cm on image number 67/2, previously 2.9 cm in maximum diameter. Normal appearing uterus. Other: Small umbilical and infraumbilical hernias containing fat. Small left inguinal hernia containing fat. Musculoskeletal: Interval diffuse heterogeneous bone sclerosis. IMPRESSION: 1. Increased size of bilateral ovarian masses, especially on the left. 2. Interval diffuse bony metastatic disease. 3. Progressive metastatic bilateral pelvic and inguinal lymphadenopathy. 4. Increased size of a left adrenal metastasis. 5. Interval nodular densities at the lung bases suspicious for metastases. 6. Interval mild right hydronephrosis and hydroureter to the level of the ureterovesical junction. This could be due to recent stone passage, obstruction by  wall thickening of the bladder or a nonvisualized obstructing ureteral metastasis. Electronically Signed   By: Claudie Revering M.D.   On: 02/20/2021 18:28   CT HEAD WO CONTRAST (5MM)  Result Date: 02/22/2021 CLINICAL DATA:  Encephalopathy EXAM: CT HEAD WITHOUT CONTRAST TECHNIQUE: Contiguous axial images were obtained from the base of the skull through the vertex without intravenous contrast. RADIATION DOSE REDUCTION: This exam was performed according to the departmental dose-optimization program which includes automated exposure control, adjustment of the mA and/or kV according to patient size and/or use of iterative reconstruction technique. COMPARISON:  None. FINDINGS: Brain: There is no mass, hemorrhage or extra-axial collection. The size and configuration of the ventricles and extra-axial CSF spaces are normal. The brain parenchyma is normal, without acute or chronic infarction. Vascular: No abnormal hyperdensity of the major intracranial arteries or dural venous sinuses. No intracranial atherosclerosis. Skull: The visualized skull base, calvarium and extracranial soft tissues are normal. Sinuses/Orbits: No fluid levels or advanced mucosal thickening of the visualized paranasal sinuses. No mastoid or middle ear effusion. The orbits are normal. IMPRESSION: Normal head CT. Electronically Signed   By: Ulyses Jarred M.D.   On: 02/22/2021 22:14   MR BRAIN W WO CONTRAST  Result Date: 03/03/2021 CLINICAL DATA:  Metastatic disease evaluation EXAM: MRI HEAD WITHOUT AND WITH CONTRAST TECHNIQUE: Multiplanar, multiecho pulse sequences of the brain and surrounding structures were obtained without and with intravenous contrast. CONTRAST:  16mL GADAVIST GADOBUTROL 1 MMOL/ML IV SOLN COMPARISON:  No prior MRI, correlation is made with CT head 02/22/2021 FINDINGS: Evaluation is  somewhat limited by motion artifact, particularly on postcontrast imaging. Brain: No restricted diffusion to suggest acute or subacute infarct. No  acute hemorrhage, mass, mass effect, or midline shift. No foci of hemosiderin deposition to suggest remote hemorrhage. Scattered T2 hyperintense signal in the periventricular white matter, likely the sequela of mild chronic small vessel ischemic disease. No hydrocephalus or extra-axial collection. Vascular: Normal flow voids. Skull and upper cervical spine: No suspicious osseous lesions. Sinuses/Orbits: Negative. Other: None. IMPRESSION: Evaluation is limited by motion artifact, particularly on postcontrast imaging. Within this limitation no definite enhancing foci to suggest metastatic disease. No acute intracranial process. Electronically Signed   By: Merilyn Baba M.D.   On: 03/03/2021 23:47   DG CHEST PORT 1 VIEW  Result Date: 03/11/2021 CLINICAL DATA:  Cough, former smoker. EXAM: PORTABLE CHEST 1 VIEW COMPARISON:  02/20/2021. FINDINGS: The heart is enlarged the mediastinal contour is stable. A stable right chest port terminates over the superior vena cava. Airspace opacities are noted in the mid to lower lung fields bilaterally and the medial aspect of the left upper lobe. No effusion or pneumothorax. Sclerotic regions are noted within the bones. IMPRESSION: 1. Airspace disease in the mid to lower lung fields bilaterally and medial aspect of the left upper lobe, suspicious for pneumonia. 2. Cardiomegaly. 3. 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 Electronically Signed   By: Brett Fairy M.D.   On: 03/11/2021 20:15   DG CHEST PORT 1 VIEW  Result Date: 02/20/2021 CLINICAL DATA:  History of gastric and ovarian cancer. Fatigue, nausea, vomiting, diarrhea EXAM: PORTABLE CHEST 1 VIEW COMPARISON:  12/31/2020 FINDINGS: Right Port-A-Cath in place with the tip at the cavoatrial junction. Heart is borderline in size. Mild vascular congestion. No confluent airspace opacities, effusions or overt edema. No acute bony abnormality. IMPRESSION: Borderline heart size.  Mild vascular  congestion. Electronically Signed   By: Rolm Baptise M.D.   On: 02/20/2021 21:30   EEG adult  Result Date: 03/07/2021 Lora Havens, MD     03/07/2021  8:48 AM Patient Name: Erin [name] MRN: [id number] Epilepsy Attending: Lora Havens Referring Physician/Provider: Elodia Florence., MD Date: 03/06/2021 Duration: 21.53 mins Patient history: 56 year old female with altered mental status.  EEG to evaluate for seizure. Level of alertness: Awake, asleep AEDs during EEG study: None Technical aspects: This EEG study was done with scalp electrodes positioned according to the 10-20 International system of electrode placement. Electrical activity was acquired at a sampling rate of $Remov'500Hz'uDRnbB$  and reviewed with a high frequency filter of $RemoveB'70Hz'hbIPStDe$  and a low frequency filter of $RemoveB'1Hz'VvauOUWG$ . EEG data were recorded continuously and digitally stored. Description: The posterior dominant rhythm consists of 7 Hz activity of moderate voltage (25-35 uV) seen predominantly in posterior head regions, symmetric and reactive to eye opening and eye closing. Sleep was characterized by sleep spindles (12 to 14 Hz), maximal frontocentral region.  EEG showed continuous generalized 3 to 6 Hz theta-delta slowing. Hyperventilation and photic stimulation were not performed.   ABNORMALITY - Continuous slow, generalized - Background slow IMPRESSION: This study is suggestive of moderate diffuse encephalopathy, nonspecific etiology. No seizures or epileptiform discharges were seen throughout the recording. Lora Havens   ECHOCARDIOGRAM COMPLETE  Result Date: 03/02/2021    ECHOCARDIOGRAM REPORT   Patient Name:   AALINA BREGE Saulsbury Date of Exam: 03/02/2021 Medical Rec #:  [id number]           Height:       64.0 in  Accession #:    [id number]          Weight:       246.7 lb Date of Birth:  1965-10-21           BSA:          2.139 m Patient Age:    56 years            BP:           99/59 mmHg Patient Gender: F                   HR:           100  bpm. Exam Location:  Inpatient Procedure: 2D Echo, Cardiac Doppler and Color Doppler Indications:    BACTEREMIA  History:        Patient has no prior history of Echocardiogram examinations.                 Signs/Symptoms:Shortness of Breath and Bacteremia; Risk                 Factors:Hypertension and Diabetes. HLD.  Sonographer:    Beryle Beams Referring Phys: [id number] Joes T VU  Sonographer Comments: Patient is morbidly obese. PATIENT REFUSED SSN/ ALSO HOLDING BREATHE DURING STUDY. IMPRESSIONS  1. Left ventricular ejection fraction, by estimation, is 60 to 65%. The left ventricle has normal function. The left ventricle has no regional wall motion abnormalities. Left ventricular diastolic parameters are consistent with Grade I diastolic dysfunction (impaired relaxation).  2. Right ventricular systolic function is normal. The right ventricular size is normal. There is mildly elevated pulmonary artery systolic pressure.  3. A small pericardial effusion is present.  4. The mitral valve is normal in structure. No evidence of mitral valve regurgitation. No evidence of mitral stenosis.  5. The aortic valve is tricuspid. There is moderate calcification of the aortic valve. There is mild thickening of the aortic valve. Aortic valve regurgitation is not visualized. No aortic stenosis is present.  6. The inferior vena cava is normal in size with greater than 50% respiratory variability, suggesting right atrial pressure of 3 mmHg. FINDINGS  Left Ventricle: Left ventricular ejection fraction, by estimation, is 60 to 65%. The left ventricle has normal function. The left ventricle has no regional wall motion abnormalities. The left ventricular internal cavity size was normal in size. There is  no left ventricular hypertrophy. Left ventricular diastolic parameters are consistent with Grade I diastolic dysfunction (impaired relaxation). Normal left ventricular filling pressure. Right Ventricle: The right ventricular size is  normal. No increase in right ventricular wall thickness. Right ventricular systolic function is normal. There is mildly elevated pulmonary artery systolic pressure. The tricuspid regurgitant velocity is 3.21  m/s, and with an assumed right atrial pressure of 3 mmHg, the estimated right ventricular systolic pressure is 84.5 mmHg. Left Atrium: Left atrial size was normal in size. Right Atrium: Right atrial size was normal in size. Pericardium: A small pericardial effusion is present. Mitral Valve: The mitral valve is normal in structure. No evidence of mitral valve regurgitation. No evidence of mitral valve stenosis. Tricuspid Valve: The tricuspid valve is normal in structure. Tricuspid valve regurgitation is mild . No evidence of tricuspid stenosis. Aortic Valve: The aortic valve is tricuspid. There is moderate calcification of the aortic valve. There is mild thickening of the aortic valve. Aortic valve regurgitation is not visualized. No aortic stenosis is present. Aortic valve mean gradient measures 3.5 mmHg. Aortic valve peak  gradient measures 7.3 mmHg. Aortic valve area, by VTI measures 2.13 cm. Pulmonic Valve: The pulmonic valve was normal in structure. Pulmonic valve regurgitation is mild. No evidence of pulmonic stenosis. Aorta: The aortic root is normal in size and structure. Venous: The inferior vena cava is normal in size with greater than 50% respiratory variability, suggesting right atrial pressure of 3 mmHg. IAS/Shunts: No atrial level shunt detected by color flow Doppler.  LEFT VENTRICLE PLAX 2D LVIDd:         4.40 cm     Diastology LVIDs:         2.30 cm     LV e' medial:    9.79 cm/s LV PW:         1.10 cm     LV E/e' medial:  7.7 LV IVS:        0.90 cm     LV e' lateral:   12.60 cm/s LVOT diam:     2.00 cm     LV E/e' lateral: 6.0 LV SV:         42 LV SV Index:   20 LVOT Area:     3.14 cm  LV Volumes (MOD) LV vol d, MOD A2C: 50.2 ml LV vol d, MOD A4C: 38.9 ml LV vol s, MOD A2C: 8.5 ml LV vol s, MOD  A4C: 9.6 ml LV SV MOD A2C:     41.7 ml LV SV MOD A4C:     38.9 ml LV SV MOD BP:      36.7 ml RIGHT VENTRICLE             IVC RV S prime:     17.90 cm/s  IVC diam: 2.00 cm TAPSE (M-mode): 2.4 cm LEFT ATRIUM             Index        RIGHT ATRIUM           Index LA diam:        4.30 cm 2.01 cm/m   RA Area:     16.70 cm LA Vol (A2C):   44.6 ml 20.85 ml/m  RA Volume:   48.50 ml  22.68 ml/m LA Vol (A4C):   49.8 ml 23.28 ml/m LA Biplane Vol: 48.1 ml 22.49 ml/m  AORTIC VALVE                    PULMONIC VALVE AV Area (Vmax):    1.98 cm     PV Vmax:       0.89 m/s AV Area (Vmean):   1.98 cm     PV Vmean:      55.000 cm/s AV Area (VTI):     2.13 cm     PV VTI:        0.118 m AV Vmax:           135.50 cm/s  PV Peak grad:  3.1 mmHg AV Vmean:          88.550 cm/s  PV Mean grad:  1.0 mmHg AV VTI:            0.196 m AV Peak Grad:      7.3 mmHg AV Mean Grad:      3.5 mmHg LVOT Vmax:         85.25 cm/s LVOT Vmean:        55.800 cm/s LVOT VTI:          0.133 m LVOT/AV VTI ratio: 0.68  AORTA Ao  Root diam: 2.90 cm Ao Asc diam:  3.10 cm MITRAL VALVE                TRICUSPID VALVE MV Area (PHT): 3.11 cm     TV Peak grad:   33.4 mmHg MV Decel Time: 244 msec     TV Mean grad:   22.0 mmHg MV E velocity: 75.30 cm/s   TV Vmax:        2.89 m/s MV A velocity: 122.00 cm/s  TV Vmean:       224.0 cm/s MV E/A ratio:  0.62         TV VTI:         0.78 msec                             TR Peak grad:   41.2 mmHg                             TR Vmax:        321.00 cm/s                              SHUNTS                             Systemic VTI:  0.13 m                             Systemic Diam: 2.00 cm Skeet Latch MD Electronically signed by Skeet Latch MD Signature Date/Time: 03/02/2021/3:52:35 PM    Final    VAS [place] LOWER EXTREMITY VENOUS (DVT)  Result Date: 03/12/2021  Lower Venous DVT Study Patient Name:  ADDYSIN PORCO Papadopoulos  Date of Exam:   03/12/2021 Medical Rec #: [id number]            Accession #:    [id number] Date of Birth:  November 11, 1965            Patient Gender: F Patient Age:   60 years Exam Location:  [facility] Procedure:      VAS [place] LOWER EXTREMITY VENOUS (DVT) Referring Phys: A POWELL JR --------------------------------------------------------------------------------  Indications: Edema.  Risk Factors: Cancer. Limitations: Body habitus and poor ultrasound/tissue interface. Comparison Study: No prior studies. Performing Technologist: Oliver Hum RVT  Examination Guidelines: A complete evaluation includes B-mode imaging, spectral Doppler, color Doppler, and power Doppler as needed of all accessible portions of each vessel. Bilateral testing is considered an integral part of a complete examination. Limited examinations for reoccurring indications may be performed as noted. The reflux portion of the exam is performed with the patient in reverse Trendelenburg.  +---------+---------------+---------+-----------+----------+--------------+  RIGHT     Compressibility Phasicity Spontaneity Properties Thrombus Aging  +---------+---------------+---------+-----------+----------+--------------+  CFV       Full            Yes       Yes                                    +---------+---------------+---------+-----------+----------+--------------+  SFJ       Full                                                             +---------+---------------+---------+-----------+----------+--------------+  FV Prox   Full                                                             +---------+---------------+---------+-----------+----------+--------------+  FV Mid    Full                                                             +---------+---------------+---------+-----------+----------+--------------+  FV Distal Full                                                             +---------+---------------+---------+-----------+----------+--------------+  PFV       Full                                                              +---------+---------------+---------+-----------+----------+--------------+  POP       Full            Yes       Yes                                    +---------+---------------+---------+-----------+----------+--------------+  PTV       Full                                                             +---------+---------------+---------+-----------+----------+--------------+  PERO      Full                                                             +---------+---------------+---------+-----------+----------+--------------+   +---------+---------------+---------+-----------+----------+--------------+  LEFT      Compressibility Phasicity Spontaneity Properties Thrombus Aging  +---------+---------------+---------+-----------+----------+--------------+  CFV       Full            Yes       Yes                                    +---------+---------------+---------+-----------+----------+--------------+  SFJ       Full                                                             +---------+---------------+---------+-----------+----------+--------------+  FV Prox   Full                                                             +---------+---------------+---------+-----------+----------+--------------+  FV Mid    Full                                                             +---------+---------------+---------+-----------+----------+--------------+  FV Distal Full                                                             +---------+---------------+---------+-----------+----------+--------------+  PFV       Full                                                             +---------+---------------+---------+-----------+----------+--------------+  POP       Full            Yes       Yes                                    +---------+---------------+---------+-----------+----------+--------------+  PTV       Full                                                              +---------+---------------+---------+-----------+----------+--------------+  PERO      Full                                                             +---------+---------------+---------+-----------+----------+--------------+     Summary: RIGHT: - There is no evidence of deep vein thrombosis in the lower extremity. However, portions of this examination were limited- see technologist comments above.  - No cystic structure found in the popliteal fossa.  LEFT: - There is no evidence of deep vein thrombosis in the lower extremity. However, portions of this examination were limited- see technologist comments above.  - No cystic structure found in the popliteal fossa.  *See table(s) above for measurements and observations. Electronically signed by Jamelle Haring on 03/12/2021 at 3:38:01 PM.    Final      No future appointments.      LOS: 21 days

## 2021-03-14 ENCOUNTER — Telehealth: Payer: Self-pay | Admitting: *Deleted

## 2021-03-14 NOTE — Telephone Encounter (Signed)
Dr. Benay Spice requesting lab/OV end of week of 03/17/21 or early following week. Called SNF and confirmed she was admitted there and left message for transportation coordinator to determine if they will transport her to MD appointment in [place] from [place].

## 2021-03-14 NOTE — TOC Progression Note (Signed)
Transition of Care [facility]) - Progression Note    Patient Details  Name: Erin [name] MRN: [id number] Date of Birth: 1965/03/11  [facility] CM/SW Contact  Purcell Mouton, RN Phone Number: 03/14/2021, 12:43 PM  Clinical Narrative:    [facility] just called to ask for hard script for Oxycodone and MRN fax to them. Fax [phone]  [facility] in [place] [phone] with [facility].    Expected Discharge Plan: Home/Self Care Barriers to Discharge: No Barriers Identified  Expected Discharge Plan and Services Expected Discharge Plan: Home/Self Care       Living arrangements for the past 2 months: Single Family Home Expected Discharge Date: 03/13/21                                     Social Determinants of Health (SDOH) Interventions    Readmission Risk Interventions Readmission Risk Prevention Plan 01/06/2021  Transportation Screening Complete  Medication Review [employer]) Complete  PCP or Specialist appointment within 3-5 days of discharge Complete  HRI or Home Care Consult Complete  SW Recovery Care/Counseling Consult Complete  Palliative Care Screening Not [place] Complete  Some recent data might be hidden

## 2021-03-17 ENCOUNTER — Encounter: Payer: Self-pay | Admitting: Oncology

## 2021-03-17 NOTE — Telephone Encounter (Signed)
Called facility and left 2nd message asking if they will transport her to [facility] for oncology follow up. Attempted to reach her niece without success to determine if she would be able to transport her if facility is not.

## 2021-03-18 ENCOUNTER — Encounter: Payer: Self-pay | Admitting: Oncology

## 2021-03-18 NOTE — Telephone Encounter (Addendum)
Left message again requesting transportation coordinator call re: office visit transport to [facility]. Staff worker reports he has been given the message twice. Was able to reach her niece and she can bring Erin [name] to MD visit if SNF is not able.

## 2021-03-21 ENCOUNTER — Telehealth: Payer: Self-pay | Admitting: *Deleted

## 2021-03-21 ENCOUNTER — Other Ambulatory Visit (HOSPITAL_COMMUNITY): Payer: Self-pay

## 2021-03-21 NOTE — Telephone Encounter (Signed)
Called niece, Ms. Eulas Post to try to arrange for f/u appointment with her transporting since SNF is not responding to RN requesting transport ability to bring her here. Niece reports she was admitted to [facility] last night due to generalized swelling. Noted per [facility] that she was admitted with SOB/hypoxia and fever. Has pleural effusion and started on IV Vancomycin.

## 2021-04-09 ENCOUNTER — Encounter: Payer: Self-pay | Admitting: *Deleted

## 2021-04-09 NOTE — Progress Notes (Signed)
Call from Dr. Michel Bickers at [facility] ICU. Asking if we have any documents that show who her medical POA is. Patient currently in ICU and not doing well.  ?Located her [facility] Goals of Care Form and faxed this to [phone]. ?

## 2021-05-10 DEATH — deceased

## 2021-09-01 ENCOUNTER — Other Ambulatory Visit: Payer: Self-pay

## 2021-09-17 ENCOUNTER — Encounter (INDEPENDENT_AMBULATORY_CARE_PROVIDER_SITE_OTHER): Payer: Self-pay

## 2021-12-08 ENCOUNTER — Encounter: Payer: Self-pay | Admitting: Oncology

## 2021-12-09 ENCOUNTER — Encounter: Payer: Self-pay | Admitting: Oncology

## 2022-09-16 IMAGING — MR MR HEAD WO/W CM
13 series · 48 of 48 positions shown · IV contrast (gadavist)
Comparison: No prior MRI, correlation is made with CT head
02/22/2021

CLINICAL DATA: Metastatic disease evaluation

EXAM:
MRI HEAD WITHOUT AND WITH CONTRAST
TECHNIQUE: Multiplanar, multiecho pulse sequences of the brain and surrounding
structures were obtained without and with intravenous contrast.
CONTRAST:  10mL GADAVIST GADOBUTROL 1 MMOL/ML IV SOLN

[Series 5: DWI · axial · 3.0mm · 1.36mm/px · z∈[-69,+72]mm · 6 of 96 slices shown (1 of 2)]
[im 1/96]
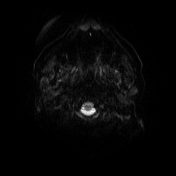
[im 20/96]
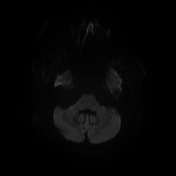
[im 39/96]
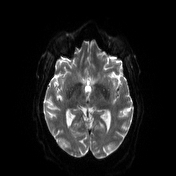
[im 58/96]
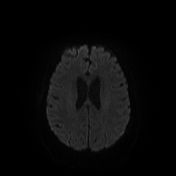
[im 77/96]
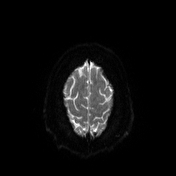
[im 96/96]
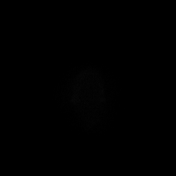

[Series 6: DWI · axial · 3.0mm · 1.36mm/px · z∈[-69,+72]mm · 3 of 48 slices shown (2 of 2)]
[im 1/48]
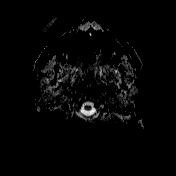
[im 24/48]
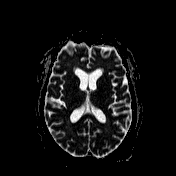
[im 48/48]
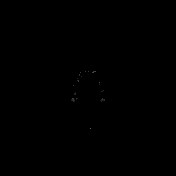

[Series 7: T1 · sagittal · 5.0mm · 0.94mm/px · 2 of 24 slices shown (1 of 2)]
[im 1/24]
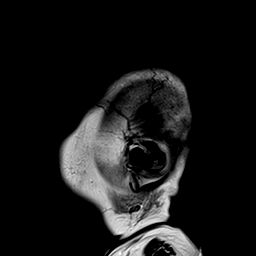
[im 24/24]
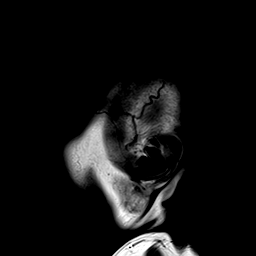

[Series 8: T2 · axial · 5.0mm · 0.75mm/px · 1 of 22 slices shown]
[im 1/22]
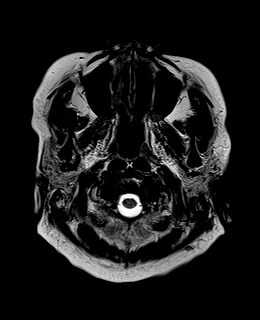

[Series 9: swi_images · axial · 3.0mm · 0.75mm/px · z∈[-59,+70]mm · 3 of 44 slices shown]
[im 1/44]
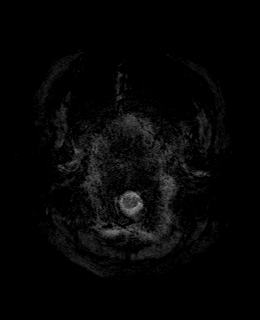
[im 22/44]
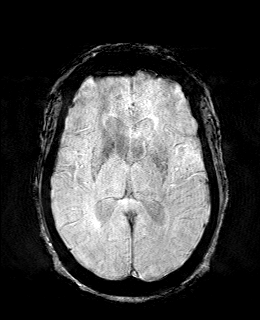
[im 44/44]
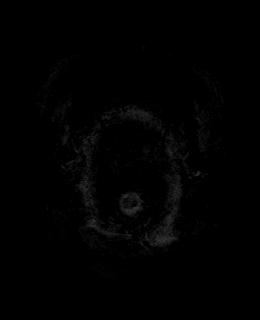

[Series 11: FLAIR · axial · 3.0mm · 0.75mm/px · z∈[-71,+82]mm · 3 of 52 slices shown]
[im 1/52]
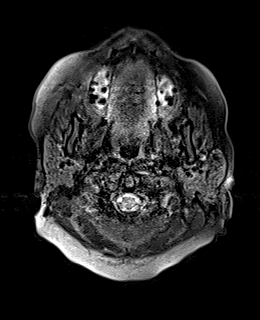
[im 26/52]
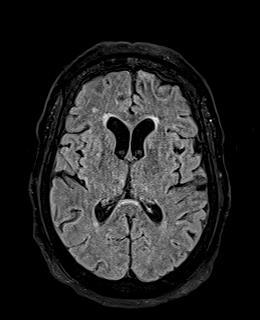
[im 52/52]
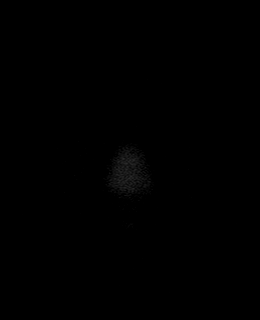

[Series 12: T1 · axial · 1.0mm · 0.94mm/px · z∈[-67,+75]mm · 9 of 144 slices shown (2 of 2)]
[im 1/144]
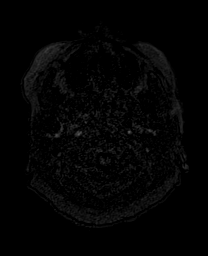
[im 18/144]
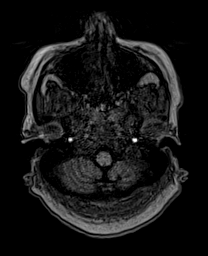
[im 36/144]
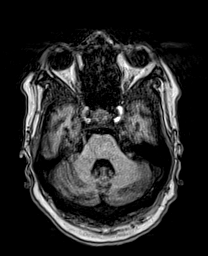
[im 54/144]
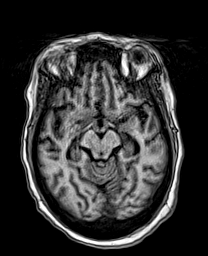
[im 72/144]
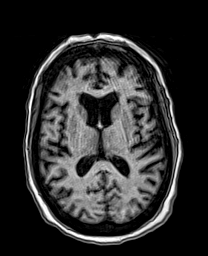
[im 90/144]
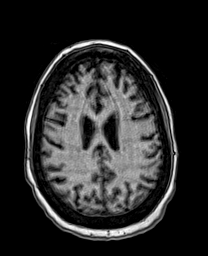
[im 108/144]
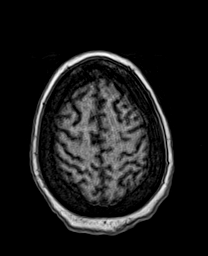
[im 126/144]
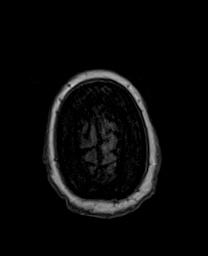
[im 144/144]
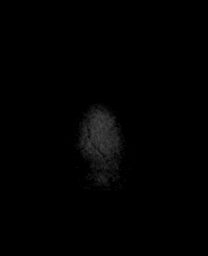

[Series 13: cor dwi_tracew · coronal · 5.0mm · 1.53mm/px · 4 of 54 slices shown]
[im 1/54]
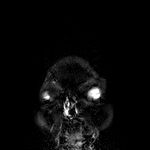
[im 18/54]
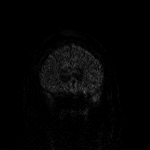
[im 36/54]
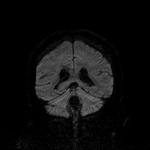
[im 54/54]
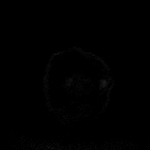

[Series 14: cor dwi_adc · coronal · 5.0mm · 1.53mm/px · 2 of 27 slices shown]
[im 1/27]
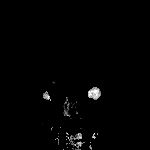
[im 27/27]
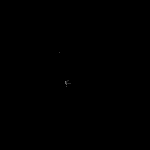

[Series 15: T1 post-contrast · axial · 1.0mm · 0.94mm/px · z∈[-67,+75]mm · 9 of 144 slices shown (1 of 3)]
[im 1/144]
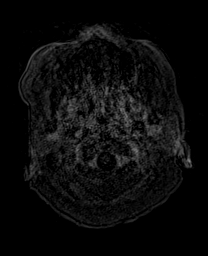
[im 18/144]
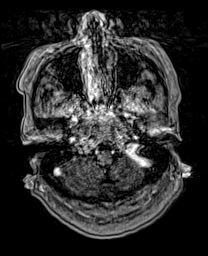
[im 36/144]
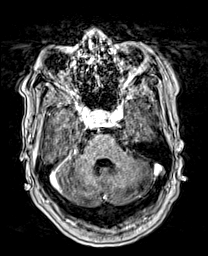
[im 54/144]
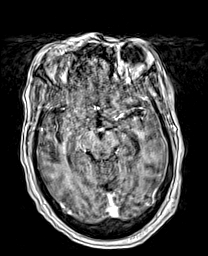
[im 72/144]
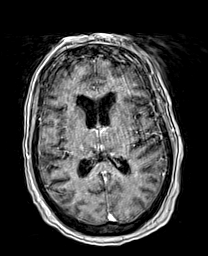
[im 90/144]
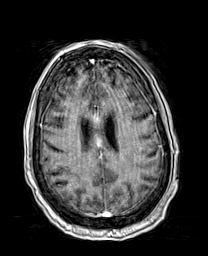
[im 108/144]
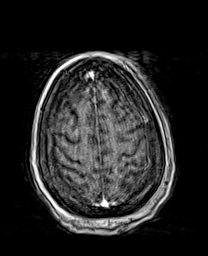
[im 126/144]
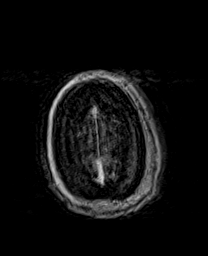
[im 144/144]
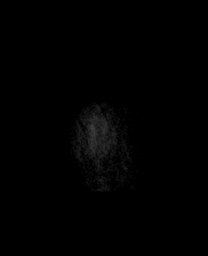

[Series 16: T1 post-contrast · coronal · 5.0mm · 0.43mm/px · 2 of 27 slices shown (2 of 3)]
[im 1/27]
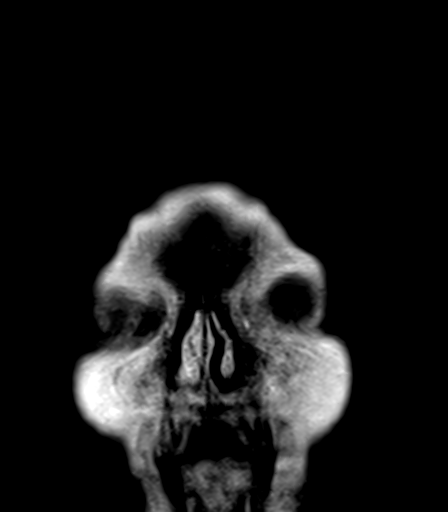
[im 27/27]
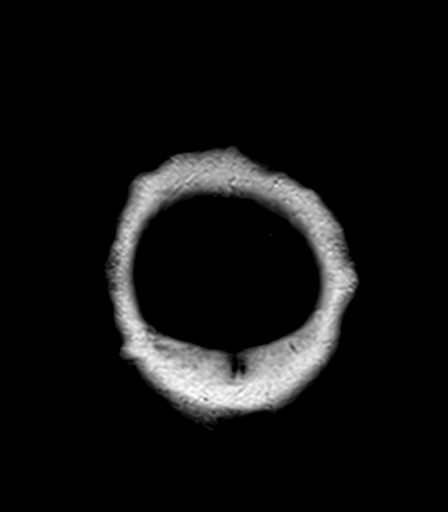

[Series 17: T1 post-contrast · sagittal · 5.0mm · 0.75mm/px · 2 of 24 slices shown (3 of 3)]
[im 1/24]
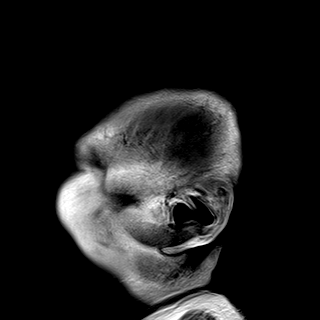
[im 24/24]
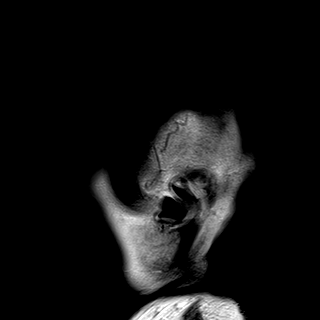

[Series 18: T2 post-contrast · coronal · 5.0mm · 0.69mm/px · 2 of 27 slices shown]
[im 1/27]
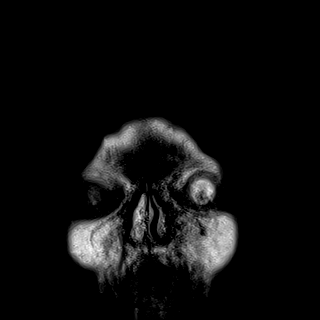
[im 27/27]
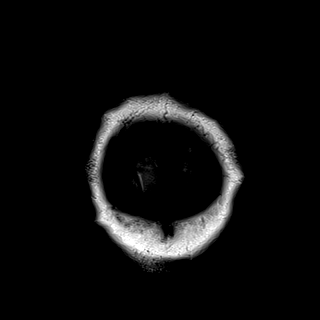

[48 of 48 positions shown; findings below may reference images not displayed]

FINDINGS: Evaluation is somewhat limited by motion artifact, particularly on
postcontrast imaging.

Brain: No restricted diffusion to suggest acute or subacute infarct.
No acute hemorrhage, mass, mass effect, or midline shift. No foci of
hemosiderin deposition to suggest remote hemorrhage. Scattered T2
hyperintense signal in the periventricular white matter, likely the
sequela of mild chronic small vessel ischemic disease. No
hydrocephalus or extra-axial collection.

Vascular: Normal flow voids.

Skull and upper cervical spine: No suspicious osseous lesions.

Sinuses/Orbits: Negative.

Other: None.
IMPRESSION: Evaluation is limited by motion artifact, particularly on
postcontrast imaging. Within this limitation no definite enhancing
foci to suggest metastatic disease. No acute intracranial process.

## 2022-10-16 ENCOUNTER — Encounter: Payer: Self-pay | Admitting: Obstetrics and Gynecology
